# Patient Record
Sex: Female | Born: 1935
Health system: Southern US, Community
[De-identification: ages and names within clinical notes are randomized; demographics above are authoritative.]

## PROBLEM LIST (undated history)

## (undated) DIAGNOSIS — J45909 Unspecified asthma, uncomplicated: Secondary | ICD-10-CM

## (undated) DIAGNOSIS — I4891 Unspecified atrial fibrillation: Secondary | ICD-10-CM

## (undated) DIAGNOSIS — L2089 Other atopic dermatitis: Secondary | ICD-10-CM

## (undated) DIAGNOSIS — R001 Bradycardia, unspecified: Secondary | ICD-10-CM

## (undated) DIAGNOSIS — E785 Hyperlipidemia, unspecified: Secondary | ICD-10-CM

## (undated) DIAGNOSIS — C801 Malignant (primary) neoplasm, unspecified: Secondary | ICD-10-CM

## (undated) DIAGNOSIS — I2510 Atherosclerotic heart disease of native coronary artery without angina pectoris: Secondary | ICD-10-CM

## (undated) DIAGNOSIS — Z8639 Personal history of other endocrine, nutritional and metabolic disease: Secondary | ICD-10-CM

## (undated) DIAGNOSIS — J301 Allergic rhinitis due to pollen: Secondary | ICD-10-CM

## (undated) DIAGNOSIS — I5022 Chronic systolic (congestive) heart failure: Secondary | ICD-10-CM

## (undated) DIAGNOSIS — N83209 Unspecified ovarian cyst, unspecified side: Secondary | ICD-10-CM

## (undated) DIAGNOSIS — I469 Cardiac arrest, cause unspecified: Secondary | ICD-10-CM

## (undated) DIAGNOSIS — I255 Ischemic cardiomyopathy: Secondary | ICD-10-CM

## (undated) DIAGNOSIS — Z95810 Presence of automatic (implantable) cardiac defibrillator: Secondary | ICD-10-CM

## (undated) DIAGNOSIS — L309 Dermatitis, unspecified: Secondary | ICD-10-CM

## (undated) DIAGNOSIS — I447 Left bundle-branch block, unspecified: Secondary | ICD-10-CM

## (undated) DIAGNOSIS — Z853 Personal history of malignant neoplasm of breast: Secondary | ICD-10-CM

## (undated) DIAGNOSIS — I10 Essential (primary) hypertension: Secondary | ICD-10-CM

## (undated) HISTORY — DX: Atherosclerotic heart disease of native coronary artery without angina pectoris: I25.10

## (undated) HISTORY — PX: ABDOMINAL HYSTERECTOMY: SHX81

## (undated) HISTORY — PX: POLYPECTOMY: SHX149

## (undated) HISTORY — DX: Cardiac arrest, cause unspecified: I46.9

## (undated) HISTORY — PX: COLONOSCOPY: SHX174

## (undated) HISTORY — DX: Other atopic dermatitis: L20.89

## (undated) HISTORY — DX: Essential (primary) hypertension: I10

## (undated) HISTORY — DX: Ischemic cardiomyopathy: I25.5

## (undated) HISTORY — PX: CORONARY STENT PLACEMENT: SHX1402

## (undated) HISTORY — DX: Unspecified asthma, uncomplicated: J45.909

## (undated) HISTORY — DX: Presence of automatic (implantable) cardiac defibrillator: Z95.810

## (undated) HISTORY — DX: Left bundle-branch block, unspecified: I44.7

## (undated) HISTORY — PX: PTCA: SHX146

## (undated) HISTORY — DX: Personal history of malignant neoplasm of breast: Z85.3

## (undated) HISTORY — DX: Unspecified ovarian cyst, unspecified side: N83.209

## (undated) HISTORY — DX: Unspecified atrial fibrillation: I48.91

## (undated) HISTORY — DX: Personal history of other endocrine, nutritional and metabolic disease: Z86.39

## (undated) HISTORY — DX: Allergic rhinitis due to pollen: J30.1

## (undated) HISTORY — DX: Malignant (primary) neoplasm, unspecified: C80.1

## (undated) HISTORY — DX: Bradycardia, unspecified: R00.1

## (undated) HISTORY — DX: Chronic systolic (congestive) heart failure: I50.22

## (undated) HISTORY — DX: Hyperlipidemia, unspecified: E78.5

## (undated) HISTORY — DX: Dermatitis, unspecified: L30.9

## (undated) HISTORY — PX: OOPHORECTOMY: SHX86

## (undated) HISTORY — PX: CHOLECYSTECTOMY: SHX55

---

## 1998-04-29 ENCOUNTER — Other Ambulatory Visit: Admission: RE | Admit: 1998-04-29 | Discharge: 1998-04-29 | Payer: Self-pay | Admitting: Obstetrics and Gynecology

## 1999-07-26 ENCOUNTER — Encounter: Payer: Self-pay | Admitting: Cardiology

## 1999-07-26 ENCOUNTER — Inpatient Hospital Stay (HOSPITAL_COMMUNITY): Admission: EM | Admit: 1999-07-26 | Discharge: 1999-07-28 | Payer: Self-pay | Admitting: Cardiology

## 2001-10-01 ENCOUNTER — Encounter: Admission: RE | Admit: 2001-10-01 | Discharge: 2001-10-01 | Payer: Self-pay | Admitting: Internal Medicine

## 2001-10-01 ENCOUNTER — Encounter: Payer: Self-pay | Admitting: Internal Medicine

## 2001-10-21 ENCOUNTER — Encounter: Admission: RE | Admit: 2001-10-21 | Discharge: 2001-10-21 | Payer: Self-pay | Admitting: Urology

## 2001-10-21 ENCOUNTER — Encounter: Payer: Self-pay | Admitting: Urology

## 2001-11-18 ENCOUNTER — Encounter: Payer: Self-pay | Admitting: Obstetrics and Gynecology

## 2001-11-18 ENCOUNTER — Encounter: Admission: RE | Admit: 2001-11-18 | Discharge: 2001-11-18 | Payer: Self-pay | Admitting: Obstetrics and Gynecology

## 2001-11-29 ENCOUNTER — Ambulatory Visit: Admission: RE | Admit: 2001-11-29 | Discharge: 2001-11-29 | Payer: Self-pay | Admitting: Obstetrics and Gynecology

## 2001-12-05 ENCOUNTER — Ambulatory Visit (HOSPITAL_COMMUNITY): Admission: RE | Admit: 2001-12-05 | Discharge: 2001-12-06 | Payer: Self-pay | Admitting: Cardiology

## 2001-12-05 ENCOUNTER — Encounter: Payer: Self-pay | Admitting: Cardiology

## 2002-01-17 ENCOUNTER — Inpatient Hospital Stay (HOSPITAL_COMMUNITY): Admission: RE | Admit: 2002-01-17 | Discharge: 2002-01-19 | Payer: Self-pay | Admitting: Obstetrics and Gynecology

## 2002-01-17 ENCOUNTER — Encounter (INDEPENDENT_AMBULATORY_CARE_PROVIDER_SITE_OTHER): Payer: Self-pay | Admitting: *Deleted

## 2003-08-21 ENCOUNTER — Encounter (HOSPITAL_COMMUNITY): Admission: RE | Admit: 2003-08-21 | Discharge: 2003-11-19 | Payer: Self-pay | Admitting: General Surgery

## 2003-08-24 ENCOUNTER — Encounter (INDEPENDENT_AMBULATORY_CARE_PROVIDER_SITE_OTHER): Payer: Self-pay | Admitting: *Deleted

## 2003-08-24 ENCOUNTER — Ambulatory Visit (HOSPITAL_BASED_OUTPATIENT_CLINIC_OR_DEPARTMENT_OTHER): Admission: RE | Admit: 2003-08-24 | Discharge: 2003-08-24 | Payer: Self-pay | Admitting: General Surgery

## 2003-08-24 ENCOUNTER — Ambulatory Visit (HOSPITAL_COMMUNITY): Admission: RE | Admit: 2003-08-24 | Discharge: 2003-08-24 | Payer: Self-pay | Admitting: General Surgery

## 2003-08-24 HISTORY — PX: BREAST LUMPECTOMY: SHX2

## 2003-09-14 ENCOUNTER — Ambulatory Visit: Admission: RE | Admit: 2003-09-14 | Discharge: 2003-10-29 | Payer: Self-pay | Admitting: *Deleted

## 2003-09-18 ENCOUNTER — Ambulatory Visit (HOSPITAL_COMMUNITY): Admission: RE | Admit: 2003-09-18 | Discharge: 2003-09-18 | Payer: Self-pay | Admitting: Oncology

## 2003-09-29 ENCOUNTER — Ambulatory Visit (HOSPITAL_BASED_OUTPATIENT_CLINIC_OR_DEPARTMENT_OTHER): Admission: RE | Admit: 2003-09-29 | Discharge: 2003-09-29 | Payer: Self-pay | Admitting: General Surgery

## 2003-09-29 ENCOUNTER — Ambulatory Visit (HOSPITAL_COMMUNITY): Admission: RE | Admit: 2003-09-29 | Discharge: 2003-09-29 | Payer: Self-pay | Admitting: General Surgery

## 2003-09-29 ENCOUNTER — Encounter (INDEPENDENT_AMBULATORY_CARE_PROVIDER_SITE_OTHER): Payer: Self-pay | Admitting: *Deleted

## 2003-11-09 ENCOUNTER — Ambulatory Visit: Admission: RE | Admit: 2003-11-09 | Discharge: 2004-02-07 | Payer: Self-pay | Admitting: *Deleted

## 2003-12-17 ENCOUNTER — Ambulatory Visit: Payer: Self-pay | Admitting: Oncology

## 2003-12-18 ENCOUNTER — Ambulatory Visit (HOSPITAL_COMMUNITY): Admission: RE | Admit: 2003-12-18 | Discharge: 2003-12-18 | Payer: Self-pay | Admitting: Oncology

## 2004-02-24 ENCOUNTER — Ambulatory Visit: Payer: Self-pay | Admitting: Oncology

## 2004-03-08 ENCOUNTER — Ambulatory Visit: Admission: RE | Admit: 2004-03-08 | Discharge: 2004-03-08 | Payer: Self-pay | Admitting: *Deleted

## 2004-03-18 ENCOUNTER — Ambulatory Visit: Payer: Self-pay | Admitting: Internal Medicine

## 2004-05-16 ENCOUNTER — Ambulatory Visit: Payer: Self-pay | Admitting: Cardiology

## 2004-05-18 ENCOUNTER — Ambulatory Visit: Payer: Self-pay | Admitting: *Deleted

## 2004-05-18 ENCOUNTER — Ambulatory Visit: Payer: Self-pay

## 2004-05-25 ENCOUNTER — Ambulatory Visit: Payer: Self-pay | Admitting: Cardiology

## 2004-06-28 ENCOUNTER — Ambulatory Visit: Payer: Self-pay | Admitting: Oncology

## 2004-07-21 ENCOUNTER — Encounter: Admission: RE | Admit: 2004-07-21 | Discharge: 2004-07-21 | Payer: Self-pay | Admitting: Oncology

## 2004-10-26 ENCOUNTER — Ambulatory Visit: Payer: Self-pay | Admitting: Oncology

## 2004-12-23 ENCOUNTER — Ambulatory Visit: Payer: Self-pay | Admitting: Oncology

## 2005-06-16 ENCOUNTER — Ambulatory Visit: Payer: Self-pay | Admitting: Cardiology

## 2005-07-14 ENCOUNTER — Ambulatory Visit: Payer: Self-pay | Admitting: Oncology

## 2005-07-24 ENCOUNTER — Ambulatory Visit: Payer: Self-pay | Admitting: Oncology

## 2005-07-24 LAB — COMPREHENSIVE METABOLIC PANEL
ALT: 19 U/L (ref 0–40)
AST: 19 U/L (ref 0–37)
Albumin: 4.6 g/dL (ref 3.5–5.2)
Calcium: 9.9 mg/dL (ref 8.4–10.5)
Chloride: 106 mEq/L (ref 96–112)
Potassium: 4.3 mEq/L (ref 3.5–5.3)
Sodium: 144 mEq/L (ref 135–145)
Total Protein: 7.3 g/dL (ref 6.0–8.3)

## 2005-07-24 LAB — CBC WITH DIFFERENTIAL/PLATELET
BASO%: 0.9 % (ref 0.0–2.0)
Basophils Absolute: 0.1 10*3/uL (ref 0.0–0.1)
EOS%: 7.4 % — ABNORMAL HIGH (ref 0.0–7.0)
HGB: 13.2 g/dL (ref 11.6–15.9)
MCH: 27.5 pg (ref 26.0–34.0)
RDW: 14 % (ref 11.3–14.5)
lymph#: 1.8 10*3/uL (ref 0.9–3.3)

## 2005-07-26 ENCOUNTER — Ambulatory Visit: Payer: Self-pay | Admitting: Cardiology

## 2005-10-24 ENCOUNTER — Ambulatory Visit: Payer: Self-pay | Admitting: Gastroenterology

## 2005-10-27 ENCOUNTER — Encounter (INDEPENDENT_AMBULATORY_CARE_PROVIDER_SITE_OTHER): Payer: Self-pay | Admitting: *Deleted

## 2005-10-27 ENCOUNTER — Ambulatory Visit: Payer: Self-pay | Admitting: Gastroenterology

## 2006-01-23 ENCOUNTER — Ambulatory Visit: Payer: Self-pay | Admitting: Oncology

## 2006-01-26 LAB — CBC WITH DIFFERENTIAL/PLATELET
BASO%: 1 % (ref 0.0–2.0)
Basophils Absolute: 0.1 10*3/uL (ref 0.0–0.1)
EOS%: 7.1 % — ABNORMAL HIGH (ref 0.0–7.0)
HCT: 40.8 % (ref 34.8–46.6)
HGB: 13.6 g/dL (ref 11.6–15.9)
LYMPH%: 24.2 % (ref 14.0–48.0)
MCH: 27.2 pg (ref 26.0–34.0)
MCHC: 33.3 g/dL (ref 32.0–36.0)
MONO#: 0.7 10*3/uL (ref 0.1–0.9)
NEUT%: 59.7 % (ref 39.6–76.8)
Platelets: 394 10*3/uL (ref 145–400)

## 2006-01-26 LAB — COMPREHENSIVE METABOLIC PANEL
ALT: 20 U/L (ref 0–35)
BUN: 16 mg/dL (ref 6–23)
CO2: 27 mEq/L (ref 19–32)
Calcium: 10.6 mg/dL — ABNORMAL HIGH (ref 8.4–10.5)
Chloride: 100 mEq/L (ref 96–112)
Creatinine, Ser: 0.86 mg/dL (ref 0.40–1.20)
Total Bilirubin: 0.7 mg/dL (ref 0.3–1.2)

## 2006-01-26 LAB — LACTATE DEHYDROGENASE: LDH: 193 U/L (ref 94–250)

## 2006-01-26 LAB — CANCER ANTIGEN 27.29: CA 27.29: 17 U/mL (ref 0–39)

## 2006-02-16 LAB — VITAMIN D PNL(25-HYDRXY+1,25-DIHY)-BLD: Vit D, 25-Hydroxy: 56 ng/mL (ref 20–57)

## 2006-07-11 ENCOUNTER — Ambulatory Visit: Payer: Self-pay

## 2006-07-11 LAB — CONVERTED CEMR LAB
Albumin: 4.2 g/dL (ref 3.5–5.2)
Alkaline Phosphatase: 59 units/L (ref 39–117)
Cholesterol: 160 mg/dL (ref 0–200)
Creatinine, Ser: 0.8 mg/dL (ref 0.4–1.2)
GFR calc Af Amer: 92 mL/min
GFR calc non Af Amer: 76 mL/min
Potassium: 4 meq/L (ref 3.5–5.1)
Sodium: 145 meq/L (ref 135–145)
Total Bilirubin: 0.8 mg/dL (ref 0.3–1.2)
Total Protein: 7.1 g/dL (ref 6.0–8.3)
Triglycerides: 193 mg/dL — ABNORMAL HIGH (ref 0–149)
VLDL: 39 mg/dL (ref 0–40)

## 2006-07-17 ENCOUNTER — Ambulatory Visit: Payer: Self-pay | Admitting: Oncology

## 2006-07-18 ENCOUNTER — Ambulatory Visit: Payer: Self-pay | Admitting: Cardiology

## 2006-07-23 LAB — CBC WITH DIFFERENTIAL/PLATELET
BASO%: 0.8 % (ref 0.0–2.0)
EOS%: 8.2 % — ABNORMAL HIGH (ref 0.0–7.0)
HCT: 38.6 % (ref 34.8–46.6)
LYMPH%: 27.7 % (ref 14.0–48.0)
MCH: 27.1 pg (ref 26.0–34.0)
MCHC: 34.2 g/dL (ref 32.0–36.0)
NEUT%: 55.6 % (ref 39.6–76.8)
lymph#: 2 10*3/uL (ref 0.9–3.3)

## 2006-07-25 ENCOUNTER — Encounter: Admission: RE | Admit: 2006-07-25 | Discharge: 2006-07-25 | Payer: Self-pay | Admitting: Oncology

## 2006-07-26 LAB — COMPREHENSIVE METABOLIC PANEL
ALT: 18 U/L (ref 0–35)
AST: 20 U/L (ref 0–37)
Albumin: 4.7 g/dL (ref 3.5–5.2)
Alkaline Phosphatase: 65 U/L (ref 39–117)
BUN: 16 mg/dL (ref 6–23)
CO2: 27 mEq/L (ref 19–32)
Calcium: 9.9 mg/dL (ref 8.4–10.5)
Chloride: 102 mEq/L (ref 96–112)
Creatinine, Ser: 0.83 mg/dL (ref 0.40–1.20)
Glucose, Bld: 113 mg/dL — ABNORMAL HIGH (ref 70–99)
Potassium: 4.1 mEq/L (ref 3.5–5.3)
Sodium: 143 mEq/L (ref 135–145)
Total Bilirubin: 0.6 mg/dL (ref 0.3–1.2)
Total Protein: 7.3 g/dL (ref 6.0–8.3)

## 2006-07-26 LAB — LACTATE DEHYDROGENASE: LDH: 178 U/L (ref 94–250)

## 2006-07-26 LAB — CANCER ANTIGEN 27.29: CA 27.29: 18 U/mL (ref 0–39)

## 2006-07-31 ENCOUNTER — Ambulatory Visit: Payer: Self-pay

## 2006-08-01 ENCOUNTER — Ambulatory Visit (HOSPITAL_COMMUNITY): Admission: RE | Admit: 2006-08-01 | Discharge: 2006-08-01 | Payer: Self-pay | Admitting: Oncology

## 2006-12-03 ENCOUNTER — Ambulatory Visit (HOSPITAL_COMMUNITY): Admission: RE | Admit: 2006-12-03 | Discharge: 2006-12-03 | Payer: Self-pay | Admitting: Oncology

## 2007-01-01 ENCOUNTER — Encounter: Payer: Self-pay | Admitting: *Deleted

## 2007-01-01 DIAGNOSIS — J4520 Mild intermittent asthma, uncomplicated: Secondary | ICD-10-CM | POA: Insufficient documentation

## 2007-01-01 DIAGNOSIS — Z862 Personal history of diseases of the blood and blood-forming organs and certain disorders involving the immune mechanism: Secondary | ICD-10-CM | POA: Insufficient documentation

## 2007-01-01 DIAGNOSIS — N83209 Unspecified ovarian cyst, unspecified side: Secondary | ICD-10-CM | POA: Insufficient documentation

## 2007-01-01 DIAGNOSIS — E782 Mixed hyperlipidemia: Secondary | ICD-10-CM | POA: Insufficient documentation

## 2007-01-01 DIAGNOSIS — Z9861 Coronary angioplasty status: Secondary | ICD-10-CM | POA: Insufficient documentation

## 2007-01-01 DIAGNOSIS — Z8639 Personal history of other endocrine, nutritional and metabolic disease: Secondary | ICD-10-CM

## 2007-01-01 DIAGNOSIS — I2510 Atherosclerotic heart disease of native coronary artery without angina pectoris: Secondary | ICD-10-CM | POA: Insufficient documentation

## 2007-01-01 DIAGNOSIS — Z9089 Acquired absence of other organs: Secondary | ICD-10-CM | POA: Insufficient documentation

## 2007-01-01 DIAGNOSIS — J301 Allergic rhinitis due to pollen: Secondary | ICD-10-CM | POA: Insufficient documentation

## 2007-01-01 DIAGNOSIS — I10 Essential (primary) hypertension: Secondary | ICD-10-CM

## 2007-01-01 DIAGNOSIS — Z9079 Acquired absence of other genital organ(s): Secondary | ICD-10-CM | POA: Insufficient documentation

## 2007-01-02 ENCOUNTER — Ambulatory Visit: Payer: Self-pay | Admitting: Internal Medicine

## 2007-01-02 DIAGNOSIS — L2089 Other atopic dermatitis: Secondary | ICD-10-CM | POA: Insufficient documentation

## 2007-01-02 DIAGNOSIS — J069 Acute upper respiratory infection, unspecified: Secondary | ICD-10-CM | POA: Insufficient documentation

## 2007-02-01 ENCOUNTER — Ambulatory Visit: Payer: Self-pay | Admitting: Oncology

## 2007-02-05 LAB — COMPREHENSIVE METABOLIC PANEL
Alkaline Phosphatase: 80 U/L (ref 39–117)
BUN: 18 mg/dL (ref 6–23)
CO2: 29 mEq/L (ref 19–32)
Glucose, Bld: 121 mg/dL — ABNORMAL HIGH (ref 70–99)
Total Bilirubin: 0.5 mg/dL (ref 0.3–1.2)

## 2007-02-05 LAB — LACTATE DEHYDROGENASE: LDH: 162 U/L (ref 94–250)

## 2007-02-05 LAB — CBC WITH DIFFERENTIAL/PLATELET
Basophils Absolute: 0.1 10*3/uL (ref 0.0–0.1)
Eosinophils Absolute: 0.7 10*3/uL — ABNORMAL HIGH (ref 0.0–0.5)
HCT: 41 % (ref 34.8–46.6)
HGB: 13.6 g/dL (ref 11.6–15.9)
LYMPH%: 26.2 % (ref 14.0–48.0)
MCV: 79.1 fL — ABNORMAL LOW (ref 81.0–101.0)
MONO#: 0.6 10*3/uL (ref 0.1–0.9)
MONO%: 7.6 % (ref 0.0–13.0)
NEUT#: 4.2 10*3/uL (ref 1.5–6.5)
NEUT%: 55.9 % (ref 39.6–76.8)
Platelets: 364 10*3/uL (ref 145–400)

## 2007-02-05 LAB — CANCER ANTIGEN 27.29: CA 27.29: 17 U/mL (ref 0–39)

## 2007-04-01 ENCOUNTER — Telehealth: Payer: Self-pay | Admitting: Internal Medicine

## 2007-04-12 ENCOUNTER — Encounter: Payer: Self-pay | Admitting: Internal Medicine

## 2007-07-31 ENCOUNTER — Ambulatory Visit: Payer: Self-pay | Admitting: Oncology

## 2007-08-01 ENCOUNTER — Telehealth: Payer: Self-pay | Admitting: Internal Medicine

## 2007-08-01 ENCOUNTER — Ambulatory Visit: Payer: Self-pay | Admitting: Cardiology

## 2007-08-01 LAB — CONVERTED CEMR LAB
ALT: 19 units/L (ref 0–35)
BUN: 14 mg/dL (ref 6–23)
Bilirubin, Direct: 0.1 mg/dL (ref 0.0–0.3)
GFR calc Af Amer: 80 mL/min
GFR calc non Af Amer: 66 mL/min
HDL: 48.5 mg/dL (ref >39.0)
LDL Cholesterol: 58 mg/dL (ref 0–99)
Sodium: 139 meq/L (ref 135–145)
Total CHOL/HDL Ratio: 2.8
Total Protein: 7.2 g/dL (ref 6.0–8.3)
Triglycerides: 158 mg/dL — ABNORMAL HIGH (ref 0–149)

## 2007-08-02 ENCOUNTER — Telehealth: Payer: Self-pay | Admitting: Internal Medicine

## 2007-08-05 ENCOUNTER — Encounter: Payer: Self-pay | Admitting: Internal Medicine

## 2007-08-05 LAB — COMPREHENSIVE METABOLIC PANEL
ALT: 16 U/L (ref 0–35)
BUN: 16 mg/dL (ref 6–23)
CO2: 29 mEq/L (ref 19–32)
Calcium: 9.7 mg/dL (ref 8.4–10.5)
Chloride: 103 mEq/L (ref 96–112)
Creatinine, Ser: 0.81 mg/dL (ref 0.40–1.20)

## 2007-08-05 LAB — CANCER ANTIGEN 27.29: CA 27.29: 18 U/mL (ref 0–39)

## 2007-08-05 LAB — CBC WITH DIFFERENTIAL/PLATELET
BASO%: 0.7 % (ref 0.0–2.0)
Basophils Absolute: 0.1 10*3/uL (ref 0.0–0.1)
Eosinophils Absolute: 0.6 10*3/uL — ABNORMAL HIGH (ref 0.0–0.5)
HCT: 39.3 % (ref 34.8–46.6)
HGB: 13.3 g/dL (ref 11.6–15.9)
MONO#: 0.6 10*3/uL (ref 0.1–0.9)
NEUT#: 4.2 10*3/uL (ref 1.5–6.5)
NEUT%: 58.7 % (ref 39.6–76.8)
WBC: 7.2 10*3/uL (ref 3.9–10.0)
lymph#: 1.7 10*3/uL (ref 0.9–3.3)

## 2007-08-08 ENCOUNTER — Encounter (INDEPENDENT_AMBULATORY_CARE_PROVIDER_SITE_OTHER): Payer: Self-pay | Admitting: *Deleted

## 2007-10-09 ENCOUNTER — Encounter: Payer: Self-pay | Admitting: Internal Medicine

## 2007-11-18 ENCOUNTER — Ambulatory Visit: Payer: Self-pay | Admitting: Cardiovascular Disease

## 2007-11-18 ENCOUNTER — Inpatient Hospital Stay (HOSPITAL_COMMUNITY): Admission: EM | Admit: 2007-11-18 | Discharge: 2007-11-19 | Payer: Self-pay | Admitting: Emergency Medicine

## 2007-11-18 ENCOUNTER — Encounter: Payer: Self-pay | Admitting: Cardiovascular Disease

## 2007-11-27 ENCOUNTER — Encounter: Payer: Self-pay | Admitting: Cardiology

## 2007-11-27 ENCOUNTER — Encounter: Payer: Self-pay | Admitting: Internal Medicine

## 2007-11-27 ENCOUNTER — Ambulatory Visit: Payer: Self-pay

## 2007-12-10 ENCOUNTER — Ambulatory Visit: Payer: Self-pay | Admitting: Cardiology

## 2008-01-02 ENCOUNTER — Telehealth: Payer: Self-pay | Admitting: Internal Medicine

## 2008-02-03 ENCOUNTER — Ambulatory Visit: Payer: Self-pay | Admitting: Oncology

## 2008-02-05 ENCOUNTER — Encounter: Payer: Self-pay | Admitting: Internal Medicine

## 2008-04-02 ENCOUNTER — Ambulatory Visit: Payer: Self-pay | Admitting: Endocrinology

## 2008-04-02 DIAGNOSIS — R059 Cough, unspecified: Secondary | ICD-10-CM | POA: Insufficient documentation

## 2008-04-02 DIAGNOSIS — R05 Cough: Secondary | ICD-10-CM

## 2008-07-09 ENCOUNTER — Encounter: Payer: Self-pay | Admitting: Cardiology

## 2008-07-09 DIAGNOSIS — I4891 Unspecified atrial fibrillation: Secondary | ICD-10-CM | POA: Insufficient documentation

## 2008-07-13 ENCOUNTER — Encounter: Payer: Self-pay | Admitting: Cardiology

## 2008-07-14 ENCOUNTER — Ambulatory Visit: Payer: Self-pay | Admitting: Cardiology

## 2008-07-17 ENCOUNTER — Ambulatory Visit: Payer: Self-pay | Admitting: Cardiology

## 2008-07-21 ENCOUNTER — Telehealth (INDEPENDENT_AMBULATORY_CARE_PROVIDER_SITE_OTHER): Payer: Self-pay | Admitting: *Deleted

## 2008-07-21 LAB — CONVERTED CEMR LAB
Albumin: 4.3 g/dL (ref 3.5–5.2)
BUN: 16 mg/dL (ref 6–23)
CO2: 31 meq/L (ref 19–32)
Calcium: 9.9 mg/dL (ref 8.4–10.5)
Cholesterol: 122 mg/dL (ref 0–200)
Creatinine, Ser: 0.8 mg/dL (ref 0.4–1.2)
Potassium: 3.8 meq/L (ref 3.5–5.1)
Total Protein: 7.1 g/dL (ref 6.0–8.3)
VLDL: 26.2 mg/dL (ref 0.0–40.0)

## 2008-07-30 ENCOUNTER — Telehealth: Payer: Self-pay | Admitting: Cardiology

## 2008-08-04 ENCOUNTER — Ambulatory Visit: Payer: Self-pay | Admitting: Oncology

## 2008-08-06 ENCOUNTER — Encounter: Payer: Self-pay | Admitting: Internal Medicine

## 2008-08-06 LAB — CBC WITH DIFFERENTIAL/PLATELET
Basophils Absolute: 0 10*3/uL (ref 0.0–0.1)
EOS%: 4.8 % (ref 0.0–7.0)
HCT: 40.8 % (ref 34.8–46.6)
HGB: 13.9 g/dL (ref 11.6–15.9)
MCH: 27.9 pg (ref 25.1–34.0)
MONO#: 0.5 10*3/uL (ref 0.1–0.9)
NEUT#: 4.5 10*3/uL (ref 1.5–6.5)
NEUT%: 60.7 % (ref 38.4–76.8)
RDW: 14.4 % (ref 11.2–14.5)
WBC: 7.4 10*3/uL (ref 3.9–10.3)
lymph#: 2 10*3/uL (ref 0.9–3.3)

## 2008-08-07 LAB — COMPREHENSIVE METABOLIC PANEL
ALT: 20 U/L (ref 0–35)
AST: 19 U/L (ref 0–37)
Albumin: 4.8 g/dL (ref 3.5–5.2)
BUN: 20 mg/dL (ref 6–23)
CO2: 28 mEq/L (ref 19–32)
Calcium: 10.9 mg/dL — ABNORMAL HIGH (ref 8.4–10.5)
Chloride: 102 mEq/L (ref 96–112)
Potassium: 4 mEq/L (ref 3.5–5.3)

## 2008-08-07 LAB — VITAMIN D 25 HYDROXY (VIT D DEFICIENCY, FRACTURES): Vit D, 25-Hydroxy: 43 ng/mL (ref 30–89)

## 2008-08-07 LAB — LACTATE DEHYDROGENASE: LDH: 154 U/L (ref 94–250)

## 2008-08-10 ENCOUNTER — Encounter: Payer: Self-pay | Admitting: Internal Medicine

## 2008-08-10 ENCOUNTER — Encounter: Payer: Self-pay | Admitting: Cardiology

## 2008-09-25 ENCOUNTER — Ambulatory Visit: Payer: Self-pay | Admitting: Internal Medicine

## 2008-09-25 DIAGNOSIS — L659 Nonscarring hair loss, unspecified: Secondary | ICD-10-CM | POA: Insufficient documentation

## 2008-09-26 DIAGNOSIS — Z853 Personal history of malignant neoplasm of breast: Secondary | ICD-10-CM | POA: Insufficient documentation

## 2008-10-16 ENCOUNTER — Telehealth: Payer: Self-pay | Admitting: Cardiology

## 2008-10-27 ENCOUNTER — Telehealth: Payer: Self-pay | Admitting: Cardiology

## 2008-10-28 ENCOUNTER — Encounter: Payer: Self-pay | Admitting: Cardiology

## 2008-11-13 ENCOUNTER — Encounter (INDEPENDENT_AMBULATORY_CARE_PROVIDER_SITE_OTHER): Payer: Self-pay | Admitting: *Deleted

## 2008-12-21 ENCOUNTER — Telehealth: Payer: Self-pay | Admitting: Cardiology

## 2008-12-25 ENCOUNTER — Ambulatory Visit: Payer: Self-pay | Admitting: Oncology

## 2008-12-29 ENCOUNTER — Encounter: Payer: Self-pay | Admitting: Internal Medicine

## 2008-12-29 LAB — CBC WITH DIFFERENTIAL/PLATELET
Basophils Absolute: 0 10*3/uL (ref 0.0–0.1)
EOS%: 5.8 % (ref 0.0–7.0)
Eosinophils Absolute: 0.4 10*3/uL (ref 0.0–0.5)
HCT: 40.3 % (ref 34.8–46.6)
HGB: 13.4 g/dL (ref 11.6–15.9)
MCH: 27.8 pg (ref 25.1–34.0)
MONO#: 0.5 10*3/uL (ref 0.1–0.9)
NEUT#: 4 10*3/uL (ref 1.5–6.5)
NEUT%: 57.2 % (ref 38.4–76.8)
RDW: 14.1 % (ref 11.2–14.5)
lymph#: 2.1 10*3/uL (ref 0.9–3.3)

## 2008-12-29 LAB — COMPREHENSIVE METABOLIC PANEL
Albumin: 4.7 g/dL (ref 3.5–5.2)
BUN: 11 mg/dL (ref 6–23)
CO2: 27 mEq/L (ref 19–32)
Calcium: 9.9 mg/dL (ref 8.4–10.5)
Chloride: 104 mEq/L (ref 96–112)
Glucose, Bld: 121 mg/dL — ABNORMAL HIGH (ref 70–99)
Potassium: 4.7 mEq/L (ref 3.5–5.3)
Sodium: 142 mEq/L (ref 135–145)
Total Protein: 7.4 g/dL (ref 6.0–8.3)

## 2008-12-29 LAB — VITAMIN D 25 HYDROXY (VIT D DEFICIENCY, FRACTURES): Vit D, 25-Hydroxy: 44 ng/mL (ref 30–89)

## 2008-12-29 LAB — CANCER ANTIGEN 27.29: CA 27.29: 21 U/mL (ref 0–39)

## 2009-01-13 ENCOUNTER — Telehealth (INDEPENDENT_AMBULATORY_CARE_PROVIDER_SITE_OTHER): Payer: Self-pay | Admitting: *Deleted

## 2009-02-18 ENCOUNTER — Ambulatory Visit: Payer: Self-pay | Admitting: Cardiology

## 2009-06-29 ENCOUNTER — Ambulatory Visit: Payer: Self-pay | Admitting: Oncology

## 2009-07-01 ENCOUNTER — Encounter: Payer: Self-pay | Admitting: Internal Medicine

## 2009-07-01 LAB — CBC WITH DIFFERENTIAL/PLATELET
BASO%: 0.6 % (ref 0.0–2.0)
HCT: 39.9 % (ref 34.8–46.6)
HGB: 13.5 g/dL (ref 11.6–15.9)
LYMPH%: 28 % (ref 14.0–49.7)
MCHC: 33.9 g/dL (ref 31.5–36.0)
MCV: 82.3 fL (ref 79.5–101.0)
Platelets: 292 10*3/uL (ref 145–400)
RBC: 4.85 10*6/uL (ref 3.70–5.45)
lymph#: 2.2 10*3/uL (ref 0.9–3.3)

## 2009-07-01 LAB — COMPREHENSIVE METABOLIC PANEL
Albumin: 4.7 g/dL (ref 3.5–5.2)
BUN: 14 mg/dL (ref 6–23)
Calcium: 9.9 mg/dL (ref 8.4–10.5)
Sodium: 140 mEq/L (ref 135–145)

## 2009-07-01 LAB — LACTATE DEHYDROGENASE: LDH: 170 U/L (ref 94–250)

## 2009-07-12 ENCOUNTER — Telehealth: Payer: Self-pay | Admitting: Cardiology

## 2009-07-14 ENCOUNTER — Ambulatory Visit: Payer: Self-pay | Admitting: Cardiology

## 2009-07-15 LAB — CONVERTED CEMR LAB
AST: 24 units/L (ref 0–37)
Albumin: 4.4 g/dL (ref 3.5–5.2)
Bilirubin, Direct: 0.1 mg/dL (ref 0.0–0.3)
HDL: 49.2 mg/dL (ref >39.00)
Total Bilirubin: 0.5 mg/dL (ref 0.3–1.2)
Total CHOL/HDL Ratio: 3
Triglycerides: 183 mg/dL — ABNORMAL HIGH (ref 0.0–149.0)
VLDL: 36.6 mg/dL (ref 0.0–40.0)

## 2009-08-11 ENCOUNTER — Encounter: Payer: Self-pay | Admitting: Internal Medicine

## 2009-08-11 ENCOUNTER — Encounter: Payer: Self-pay | Admitting: Cardiology

## 2009-09-09 ENCOUNTER — Encounter: Payer: Self-pay | Admitting: Cardiology

## 2009-09-15 ENCOUNTER — Ambulatory Visit: Payer: Self-pay | Admitting: Cardiology

## 2009-09-15 DIAGNOSIS — I6529 Occlusion and stenosis of unspecified carotid artery: Secondary | ICD-10-CM | POA: Insufficient documentation

## 2009-09-21 ENCOUNTER — Ambulatory Visit: Payer: Self-pay

## 2009-09-21 ENCOUNTER — Encounter: Payer: Self-pay | Admitting: Cardiology

## 2009-10-06 ENCOUNTER — Telehealth: Payer: Self-pay | Admitting: Cardiology

## 2009-10-12 ENCOUNTER — Encounter: Payer: Self-pay | Admitting: Internal Medicine

## 2010-01-24 ENCOUNTER — Encounter: Payer: Self-pay | Admitting: Cardiology

## 2010-01-26 ENCOUNTER — Encounter: Payer: Self-pay | Admitting: Cardiology

## 2010-02-06 ENCOUNTER — Encounter: Payer: Self-pay | Admitting: Oncology

## 2010-02-17 NOTE — Letter (Signed)
Summary: [facility]  [facility] [place]   Imported By: Marylou Mccoy 02/08/2010 15:03:23  _____________________________________________________________________  External Attachment:    Type:   Image     Comment:   External Document

## 2010-02-17 NOTE — Letter (Signed)
Summary: [facility]  [facility]   Imported By: Marylou Mccoy 09/02/2009 10:17:40  _____________________________________________________________________  External Attachment:    Type:   Image     Comment:   External Document

## 2010-02-17 NOTE — Letter (Signed)
[facility]   Imported By: Lester Tinsman 01/28/2009 09:23:55  _____________________________________________________________________  External Attachment:    Type:   Image     Comment:   External Document

## 2010-02-17 NOTE — Miscellaneous (Signed)
Summary: [facility]  [facility]   Imported By: Sherian Rein 01/18/2010 12:16:07  _____________________________________________________________________  External Attachment:    Type:   Image     Comment:   External Document

## 2010-02-17 NOTE — Letter (Signed)
Summary: [facility]  [facility]   Imported By: Marylou Mccoy 10/15/2009 10:50:29  _____________________________________________________________________  External Attachment:    Type:   Image     Comment:   External Document

## 2010-02-17 NOTE — Assessment & Plan Note (Signed)
Summary: 6 mo f/u ./cy  Medications Added VYTORIN 10-40 MG  TABS (EZETIMIBE-SIMVASTATIN) Take one tablet once daily LASIX 40 MG  TABS (FUROSEMIDE) Take one tablet once daily [name] 4 MG  TABS (TRANDOLAPRIL) Take one tablet once daily NITROSTAT 0.4 MG SUBL (NITROGLYCERIN) 1 tablet under tongue at onset of chest pain; you may repeat every 5 minutes for up to 3 doses. DILT-CD 300 MG XR24H-CAP (DILTIAZEM HCL COATED BEADS) one tablet by mouth once daily KLOR-CON M20 20 MEQ CR-TABS (POTASSIUM CHLORIDE CRYS CR) 1 tab once daily        Visit Type:  6 mo f/u Primary Provider:  Jacques Navy MD  CC:  no cardiac complaints today.  History of Present Illness: Alicia Guerrero comes in today for further evaluation and management of her coronary artery disease.  She looks remarkably good today having lost over 25 pounds. I just reviewed her lipid panel showed total cholesterol 138, HDL is increased to 49.2 LDL is 52, VLDL is 36.6, triglycerides are mildly increased at 183. LFTs were normal.  She has no angina or ischemic symptoms. His stress test last year per my and her recollection that I cannot find in the computer. I remember it being normal.  She denies any palpitations or syncope.she's had no orthopnea PND or peripheral edema.  Clinical Reports Reviewed:  Cardiac Cath:  12/05/2001: Cardiac Cath Findings:   CONCLUSION:  1. Coronary artery disease status post previous percutaneous intervention as     described above with 80% narrowing in the proximal left anterior     descending, 40% ostial narrowing of the first large diagonal branch, 50%     ostial narrowing of the marginal branch of the circumflex artery, 40%     narrowing in the mid right coronary artery, less than 10% narrowing at     the stent site and the posterior lateral branch of the right coronary     artery and normal left ventricular function.  2. Successful stenting of the proximal left anterior descending lesion with  improvement in percent diameter narrowing from 80% to less than 10% with     a Cypher stent.   DISPOSITION:  DISPOSITION:  The patient was returned to the postangioplasty  unit for further observation.  She will need Plavix for three months.                                             Charlies Constable, M.D. [facility]   BB/MEDQ  D:  12/05/2001  T:  12/05/2001  Job:  [id number]   cc:   Jesse Sans. Daleen Squibb, M.D. [facility]   [name], M.D. [facility]   Cardiopulmonary Lab   Malachi Pro. Ambrose Mantle, M.D.  [address]  [place]  [place] [address]  Fax: [phone]  07/27/1999: Cardiac Cath Findings:  CONCLUSIONS: 1. Coronary artery disease with 80% narrowing in the proximal LAD, 50-70%    narrowing in the distal LAD, 90% narrowing in a small sub branch of the    diagonal branch of the LAD, 50% in the marginal branch of the circumflex    artery, and 0% stenosis at the stent site in the right coronary artery    with good LV function. 2. Successful cutting balloon angioplasty of the proximal LAD lesion with    improvement in percent area narrowing from 80% to less than 20%.  DISPOSITION:  We elected to use the cutting balloon rather than stent this because of the large diagonal side branch which arose at a fairly sharp angle. I felt the diagonal would be severely compromised with stenting, and we also would probably require a long stent due to some tandem lesions distal to the more focal severe stenosis.  The patient was returned to the post anesthesia unit for further observation. DD:  07/27/99 TD:  07/27/99 Job: [id number]   Current Medications (verified): 1)  Vytorin 10-40 Mg  Tabs (Ezetimibe-Simvastatin) .... Take One Tablet Once Daily 2)  Arimidex 1 Mg  Tabs (Anastrozole) .... Take One Tablet Once Daily 3)  Lasix 40 Mg  Tabs (Furosemide) .... Take One Tablet Once Daily 4)  Multivitamins   Tabs (Multiple Vitamin) .... Take One Tablet Once Daily 5)  [name] 4 Mg  Tabs (Trandolapril) .... Take One Tablet  Once Daily 6)  Caltrate 600+d Plus 600-400 Mg-Unit  Tabs (Calcium Carbonate-Vit D-Min) .... Take 2 Tablet Daily 7)  Aspirin 325 Mg  Tabs (Aspirin) .... Take One Tablet Once Daily 8)  Nitrostat 0.4 Mg Subl (Nitroglycerin) .Marland Kitchen.. 1 Tablet Under Tongue At Onset of Chest Pain; You May Repeat Every 5 Minutes For Up To 3 Doses. 9)  Dilt-Cd 300 Mg Xr24h-Cap (Diltiazem Hcl Coated Beads) .... One Tablet By Mouth Once Daily 10)  Klor-Con M20 20 Meq Cr-Tabs (Potassium Chloride Crys Cr) .Marland Kitchen.. 1 Tab Once Daily  Allergies: 1)  ! Penicillin 2)  ! Demerol  Past History:  Past Medical History: Last updated: 09/25/2008 LEFT BUNDLE BRANCH BLOCK/TRANSIENT (ICD-426.3) ATRIAL FIBRILLATION (ICD-427.31) Hx of MYOCARDIAL INFARCTION (ICD-410.90) CORONARY ARTERY DISEASE (ICD-414.00) HYPERLIPIDEMIA, MIXED (ICD-272.2) HYPERTENSION (ICD-401.9) COUGH (ICD-786.2) ECZEMA, ATOPIC (ICD-691.8) URI (ICD-465.9) HAY FEVER (ICD-477.0) ASTHMA (ICD-493.90) Hx of OVARIAN CYST (ICD-620.2) ADENOCARCINOMA, BREAST, HX OF (ICD-V10.3) Hx of HYPERTHYROIDISM, HX OF (ICD-V12.2) chronic eczema hands.  Past Surgical History: Last updated: 07/09/2008 OOPHORECTOMY, HX OF (ICD-V45.77) HYSTERECTOMY, HX OF (ICD-V45.77) CHOLECYSTECTOMY, HX OF (ICD-V45.79) PERCUTANEOUS TRANSLUMINAL CORONARY ANGIOPLASTY, HX OF (ICD-V45.82) * RIGHT LUMPECTOMY WITH NODE DISSECTION RADIATION THERAPY, HX OF (ICD-V15.9) G2P2  Family History: Last updated: 07/09/2008 Family History of Cancer: mother deceased at age 26 Family History of Hypertension: Mother Family History of Coronary Artery Disease: Father deceased 46 Siblings: 2 sisters..1 w/heart problems .Marland Kitchenother sister has had endometrial cancer. Siblings: 2 brothers alive and 1 brother deceased from blood clot.. the 2 brothers alive both have heart problems  Social History: Last updated: 07/09/2008 married 1961 2 sons - '65, '69 2 grandchildren worked for [employer] in [employer] until retirement SO  with multiple problems Marriage in good condition  Risk Factors: Smoking Status: never (01/01/2007)  Review of Systems       negative other than history of present illness  Vital Signs:  Patient profile:   75 year old female Height:      64 inches Weight:      175.4 pounds BMI:     30.22 Pulse rate:   56 / minute Pulse rhythm:   irregular BP sitting:   124 / 80  (left arm) Cuff size:   large  Vitals Entered By: Danielle Rankin, CMA (September 15, 2009 4:05 PM)  Physical Exam  General:  no acute distress, she has lost a lot of weight Head:  normocephalic and atraumatic Eyes:  last is otherwise normal Neck:  Neck supple, no JVD. No masses, thyromegaly or abnormal cervical nodes. Lungs:  Clear bilaterally to auscultation and percussion. Heart:  PMI nondisplaced regular rate and  rhythm, normal S1-S2, no murmur, soft right carotid bruit Msk:  Back normal, normal gait. Muscle strength and tone normal. Pulses:  pulses normal in all 4 extremities Extremities:  No clubbing or cyanosis. Neurologic:  Alert and oriented x 3. Skin:  Intact without lesions or rashes. Psych:  Normal affect.   Impression & Recommendations:  Problem # 1:  ATRIAL FIBRILLATION (ICD-427.31)  Her updated medication list for this problem includes:    Aspirin 325 Mg Tabs (Aspirin) .Marland Kitchen... Take one tablet once daily  Problem # 2:  CORONARY ARTERY DISEASE (ICD-414.00)  Her updated medication list for this problem includes:    [name] 4 Mg Tabs (Trandolapril) .Marland Kitchen... Take one tablet once daily    Aspirin 325 Mg Tabs (Aspirin) .Marland Kitchen... Take one tablet once daily    Nitrostat 0.4 Mg Subl (Nitroglycerin) .Marland Kitchen... 1 tablet under tongue at onset of chest pain; you may repeat every 5 minutes for up to 3 doses.    Dilt-cd 300 Mg Xr24h-cap (Diltiazem hcl coated beads) ..... One tablet by mouth once daily  Problem # 3:  ENCOUNTER FOR LONG-TERM USE OF OTHER MEDICATIONS (ICD-V58.69)  Problem # 4:  LEFT BUNDLE BRANCH BLOCK/TRANSIENT  (ICD-426.3)  Her updated medication list for this problem includes:    [name] 4 Mg Tabs (Trandolapril) .Marland Kitchen... Take one tablet once daily    Aspirin 325 Mg Tabs (Aspirin) .Marland Kitchen... Take one tablet once daily    Nitrostat 0.4 Mg Subl (Nitroglycerin) .Marland Kitchen... 1 tablet under tongue at onset of chest pain; you may repeat every 5 minutes for up to 3 doses.    Dilt-cd 300 Mg Xr24h-cap (Diltiazem hcl coated beads) ..... One tablet by mouth once daily  Problem # 5:  HYPERLIPIDEMIA, MIXED (ICD-272.2)  Her updated medication list for this problem includes:    Vytorin 10-40 Mg Tabs (Ezetimibe-simvastatin) .Marland Kitchen... Take one tablet once daily  Problem # 6:  HYPERTENSION (ICD-401.9)  Her updated medication list for this problem includes:    Lasix 40 Mg Tabs (Furosemide) .Marland Kitchen... Take one tablet once daily    [name] 4 Mg Tabs (Trandolapril) .Marland Kitchen... Take one tablet once daily    Aspirin 325 Mg Tabs (Aspirin) .Marland Kitchen... Take one tablet once daily    Dilt-cd 300 Mg Xr24h-cap (Diltiazem hcl coated beads) ..... One tablet by mouth once daily  Other Orders: Carotid Duplex (Carotid Duplex)  Patient Instructions: 1)  Your physician recommends that you schedule a follow-up appointment in: 6 months with Dr. Daleen Squibb 2)  Your physician recommends that you continue on your current medications as directed. Please refer to the Current Medication list given to you today. 3)  Your physician has requested that you have a carotid duplex. This test is an ultrasound of the carotid arteries in your neck. It looks at blood flow through these arteries that supply the brain with blood. Allow one hour for this exam. There are no restrictions or special instructions. Prescriptions: KLOR-CON M20 20 MEQ CR-TABS (POTASSIUM CHLORIDE CRYS CR) 1 tab once daily  #30 x 11   Entered by:   Danielle Rankin, CMA   Authorized by:   Gaylord Shih, MD, [facility]   Signed by:   Danielle Rankin, CMA on 09/15/2009   Method used:   Electronically to        [facility]  [address]  (retail)       [address]       [place], [place]  [address]  Ph: [phone]       Fax: [phone]   RxID:   [id number] DILT-CD 300 MG XR24H-CAP (DILTIAZEM HCL COATED BEADS) one tablet by mouth once daily  #30 x 11   Entered by:   Danielle Rankin, CMA   Authorized by:   Gaylord Shih, MD, [facility]   Signed by:   Danielle Rankin, CMA on 09/15/2009   Method used:   Electronically to        [facility]  [address] (retail)       [address]       [place], [place]  [address]       Ph: [phone]       Fax: [phone]   RxID:   [id number] [name] 4 MG  TABS (TRANDOLAPRIL) Take one tablet once daily  #30 x 11   Entered by:   Danielle Rankin, CMA   Authorized by:   Gaylord Shih, MD, [facility]   Signed by:   Danielle Rankin, CMA on 09/15/2009   Method used:   Electronically to        [facility]  [address] (retail)       [address]       [place], [place]  [address]       Ph: [phone]       Fax: [phone]   RxID:   [id number] LASIX 40 MG  TABS (FUROSEMIDE) Take one tablet once daily  #30 x 11   Entered by:   Danielle Rankin, CMA   Authorized by:   Gaylord Shih, MD, [facility]   Signed by:   Danielle Rankin, CMA on 09/15/2009   Method used:   Electronically to        [facility]  [address] (retail)       [address]       [place], [place]  [address]       Ph: [phone]       Fax: [phone]   RxID:   [id number] VYTORIN 10-40 MG  TABS (EZETIMIBE-SIMVASTATIN) Take one tablet once daily  #30 x 11   Entered by:   Danielle Rankin, CMA   Authorized by:   Gaylord Shih, MD, [facility]   Signed by:   Danielle Rankin, CMA on 09/15/2009   Method used:   Electronically to        [facility]  [address] (retail)       [address]       [place], [place]  [address]       Ph: [phone]       Fax: [phone]   RxID:   [id number] NITROSTAT 0.4 MG SUBL (NITROGLYCERIN) 1 tablet under tongue at onset of chest pain; you  may repeat every 5 minutes for up to 3 doses.  #25 x 7   Entered by:   Danielle Rankin, CMA   Authorized by:   Gaylord Shih, MD, [facility]   Signed by:   Danielle Rankin, CMA on 09/15/2009   Method used:   Electronically to        [facility]  [address] (retail)       [address]       [place], [place]  [address]  Ph: [phone]       Fax: [phone]   RxID:   [id number]   Appended Document: [facility]      Allergies: 1)  ! Penicillin 2)  ! Demerol   Problems:  Medical Problems Added: 1)  Dx of Carotid Artery Stenosis, Without Infarction  (ICD-433.10)  Impression & Recommendations:  Problem # 1:  CAROTID ARTERY STENOSIS, WITHOUT INFARCTION (ICD-433.10) Assessment New This is a new finding. Carotid Dopplers have been ordered. Her updated medication list for this problem includes:    Aspirin 325 Mg Tabs (Aspirin) .Marland Kitchen... Take one tablet once daily

## 2010-02-17 NOTE — Progress Notes (Signed)
Summary: Test results   Phone Note Call from Patient Call back at Home Phone [phone]   Caller: Patient Summary of Call: test results Initial call taken by: Judie Grieve,  October 06, 2009 10:36 AM  Follow-up for Phone Call        gave pt results of carotids. Claris Gladden, RN, BSN Follow-up by: Claris Gladden RN,  October 06, 2009 11:04 AM

## 2010-02-17 NOTE — Progress Notes (Signed)
Summary: does she need lab work before appt?   Phone Note Call from Patient   Caller: Patient [phone] or [phone] Reason for Call: Talk to Nurse Summary of Call: pt called to set up follow up for august-wanted to know if she needed any lab work before? pls call [phone] or [phone] Initial call taken by: Glynda Jaeger,  July 12, 2009 9:57 AM  Follow-up for Phone Call        Phone Call Completed LABS SCHEDULED FOR JUNE 29 ,2011 FASTING  BMET LIPID LIVER . Follow-up by: Scherrie Bateman, LPN,  July 12, 2009 3:46 PM

## 2010-02-17 NOTE — Assessment & Plan Note (Signed)
Summary: 414.01  272.2 427.31  6 month rov  Medications Added KLOR-CON M20 20 MEQ CR-TABS (POTASSIUM CHLORIDE CRYS CR) 1 tab once daily        Visit Type:  6 mo f/u Primary Provider:  Norins  CC:  pt states the last 3 days she has had a fever and cough..states otherwise no other complaints today.  History of Present Illness: Alicia Guerrero returns today for evaluation and her coronary artery disease, chronic left bundle branch block, history of paroxysmal atrial fibrillation.  She's doing remarkably well with no symptoms of palpitations, chest pain or angina.    Current Medications (verified): 1)  Vytorin 10-40 Mg  Tabs (Ezetimibe-Simvastatin) .... Take One Tablet Once Daily 2)  Arimidex 1 Mg  Tabs (Anastrozole) .... Take One Tablet Once Daily 3)  Lasix 40 Mg  Tabs (Furosemide) .... Take One Tablet Once Daily 4)  Multivitamins   Tabs (Multiple Vitamin) .... Take One Tablet Once Daily 5)  [name] 4 Mg  Tabs (Trandolapril) .... Take One Tablet Once Daily 6)  Caltrate 600+d Plus 600-400 Mg-Unit  Tabs (Calcium Carbonate-Vit D-Min) .... Take 2 Tablet Daily 7)  Aspirin 325 Mg  Tabs (Aspirin) .... Take One Tablet Once Daily 8)  Nitroquick 0.4 Mg  Subl (Nitroglycerin) .... Use Sl As Directed and As Needed 9)  Dilt-Cd 300 Mg Xr24h-Cap (Diltiazem Hcl Coated Beads) .... One Tablet By Mouth Once Daily 10)  Klor-Con M20 20 Meq Cr-Tabs (Potassium Chloride Crys Cr) .Marland Kitchen.. 1 Tab Once Daily  Allergies: 1)  ! Penicillin 2)  ! Demerol  Past History:  Past Medical History: Last updated: 09/25/2008 LEFT BUNDLE BRANCH BLOCK/TRANSIENT (ICD-426.3) ATRIAL FIBRILLATION (ICD-427.31) Hx of MYOCARDIAL INFARCTION (ICD-410.90) CORONARY ARTERY DISEASE (ICD-414.00) HYPERLIPIDEMIA, MIXED (ICD-272.2) HYPERTENSION (ICD-401.9) COUGH (ICD-786.2) ECZEMA, ATOPIC (ICD-691.8) URI (ICD-465.9) HAY FEVER (ICD-477.0) ASTHMA (ICD-493.90) Hx of OVARIAN CYST (ICD-620.2) ADENOCARCINOMA, BREAST, HX OF (ICD-V10.3) Hx of  HYPERTHYROIDISM, HX OF (ICD-V12.2) chronic eczema hands.  Past Surgical History: Last updated: 07/09/2008 OOPHORECTOMY, HX OF (ICD-V45.77) HYSTERECTOMY, HX OF (ICD-V45.77) CHOLECYSTECTOMY, HX OF (ICD-V45.79) PERCUTANEOUS TRANSLUMINAL CORONARY ANGIOPLASTY, HX OF (ICD-V45.82) * RIGHT LUMPECTOMY WITH NODE DISSECTION RADIATION THERAPY, HX OF (ICD-V15.9) G2P2  Family History: Last updated: 07/09/2008 Family History of Cancer: mother deceased at age 17 Family History of Hypertension: Mother Family History of Coronary Artery Disease: Father deceased 70 Siblings: 2 sisters..1 w/heart problems .Marland Kitchenother sister has had endometrial cancer. Siblings: 2 brothers alive and 1 brother deceased from blood clot.. the 2 brothers alive both have heart problems  Social History: Last updated: 07/09/2008 married 1961 2 sons - '65, '69 2 grandchildren worked for [employer] in [employer] until retirement SO with multiple problems Marriage in good condition  Risk Factors: Smoking Status: never (01/01/2007)  Review of Systems       negative other than history of present illness  Vital Signs:  Patient profile:   75 year old female Height:      64 inches Weight:      183 pounds BMI:     31.53 Pulse rate:   58 / minute Pulse rhythm:   irregular BP sitting:   128 / 70  (left arm) Cuff size:   large  Vitals Entered By: Alicia Guerrero, CMA (February 18, 2009 3:29 PM)  Physical Exam  General:  obese.   Head:  normocephalic and atraumatic Eyes:  PERRLA/EOM intact; conjunctiva and lids normal. Neck:  Neck supple, no JVD. No masses, thyromegaly or abnormal cervical nodes. Lungs:  Clear bilaterally to auscultation  and percussion. Heart:  regular rate and rhythm, normal S1, paradoxic split S2 Msk:  decreased ROM.   Pulses:  pulses normal in all 4 extremities Extremities:  No clubbing or cyanosis. Neurologic:  Alert and oriented x 3. Skin:  Intact without lesions or rashes. Psych:  Normal  affect.   EKG  Procedure date:  02/18/2009  Findings:      sinus bradycardia, left bundle branch block  Impression & Recommendations:  Problem # 1:  ATRIAL FIBRILLATION (ICD-427.31) Assessment Improved  Her updated medication list for this problem includes:    Aspirin 325 Mg Tabs (Aspirin) .Marland Kitchen... Take one tablet once daily  Problem # 2:  CORONARY ARTERY DISEASE (ICD-414.00) Assessment: Unchanged  Her updated medication list for this problem includes:    [name] 4 Mg Tabs (Trandolapril) .Marland Kitchen... Take one tablet once daily    Aspirin 325 Mg Tabs (Aspirin) .Marland Kitchen... Take one tablet once daily    Nitroquick 0.4 Mg Subl (Nitroglycerin) ..... Use sl as directed and as needed    Dilt-cd 300 Mg Xr24h-cap (Diltiazem hcl coated beads) ..... One tablet by mouth once daily  Orders: EKG w/ Interpretation (93000)  Problem # 3:  LEFT BUNDLE BRANCH BLOCK/TRANSIENT (ICD-426.3) Assessment: Unchanged  Her updated medication list for this problem includes:    [name] 4 Mg Tabs (Trandolapril) .Marland Kitchen... Take one tablet once daily    Aspirin 325 Mg Tabs (Aspirin) .Marland Kitchen... Take one tablet once daily    Nitroquick 0.4 Mg Subl (Nitroglycerin) ..... Use sl as directed and as needed    Dilt-cd 300 Mg Xr24h-cap (Diltiazem hcl coated beads) ..... One tablet by mouth once daily  Patient Instructions: 1)  Your physician recommends that you schedule a follow-up appointment in: 6 MONTHS WITH DR Serita Degroote 2)  Your physician recommends that you continue on your current medications as directed. Please refer to the Current Medication list given to you today.

## 2010-02-17 NOTE — Miscellaneous (Signed)
Clinical Lists Changes  Medications: Rx of LASIX 40 MG  TABS (FUROSEMIDE) Take one tablet once daily;  #90 x 3;  Signed;  Entered by: Lisabeth Devoid RN;  Authorized by: Gaylord Shih, MD, [facility];  Method used: Faxed to [facility] [place], , , [place]  , Ph: [phone], Fax: [phone] Rx of [name] 4 MG  TABS (TRANDOLAPRIL) Take one tablet once daily;  #90 x 3;  Signed;  Entered by: Lisabeth Devoid RN;  Authorized by: Gaylord Shih, MD, [facility];  Method used: Faxed to [facility] [place], , , [place]  , Ph: [phone], Fax: [phone] Rx of DILT-CD 300 MG XR24H-CAP (DILTIAZEM HCL COATED BEADS) one tablet by mouth once daily;  #90 x 3;  Signed;  Entered by: Lisabeth Devoid RN;  Authorized by: Gaylord Shih, MD, [facility];  Method used: Faxed to [facility] [place], , ,   , Ph: [phone], Fax: [phone]    Prescriptions: DILT-CD 300 MG XR24H-CAP (DILTIAZEM HCL COATED BEADS) one tablet by mouth once daily  #90 x 3   Entered by:   Lisabeth Devoid RN   Authorized by:   Gaylord Shih, MD, [facility]   Signed by:   Lisabeth Devoid RN on 01/26/2010   Method used:   Faxed to ...       [facility] [place] (mail-order)             , [place]         Ph: [phone]       Fax: [phone]   RxID:   [id number] [name] 4 MG  TABS (TRANDOLAPRIL) Take one tablet once daily  #90 x 3   Entered by:   Lisabeth Devoid RN   Authorized by:   Gaylord Shih, MD, [facility]   Signed by:   Lisabeth Devoid RN on 01/26/2010   Method used:   Faxed to ...       [facility] [place] (mail-order)             , [place]         Ph: [phone]       Fax: [phone]   RxID:   [id number] LASIX 40 MG  TABS (FUROSEMIDE) Take one tablet once daily  #90 x 3   Entered by:   Lisabeth Devoid RN   Authorized by:   Gaylord Shih, MD, [facility]   Signed by:   Lisabeth Devoid RN on 01/26/2010   Method used:   Faxed to ...       [facility] [place] (mail-order)             , [place]         Ph: [phone]       Fax: [phone]   RxID:   [id number]   Appended Document:  Pt aware of medication change from vytorin to Simvastatin.  She will come in for lab work on  03/21/10 fasting lipid, liver. Mylo Red RN    Clinical Lists Changes  Medications: Changed medication from VYTORIN 10-40 MG  TABS (EZETIMIBE-SIMVASTATIN) Take one tablet once daily to SIMVASTATIN 40 MG TABS (SIMVASTATIN) Take one tablet by mouth daily at bedtime - Signed Rx of SIMVASTATIN 40 MG TABS (SIMVASTATIN) Take one tablet by mouth daily at bedtime;  #90 x 3;  Signed;  Entered by: Lisabeth Devoid RN;  Authorized by: Gaylord Shih, MD, [facility];  Method used: Faxed to [facility] [place], , ,   , Ph: [phone], Fax: [phone]    Prescriptions: SIMVASTATIN 40 MG TABS (SIMVASTATIN) Take one tablet  by mouth daily at bedtime  #90 x 3   Entered by:   Lisabeth Devoid RN   Authorized by:   Gaylord Shih, MD, [facility]   Signed by:   Lisabeth Devoid RN on 01/27/2010   Method used:   Faxed to ...       [facility] [place] (mail-order[place]             , [place]         Ph: [phone]       Fax: [phone]   RxID:   [id number]

## 2010-02-17 NOTE — Letter (Signed)
[facility]   Imported By: Sherian Rein 07/27/2009 08:38:51  _____________________________________________________________________  External Attachment:    Type:   Image     Comment:   External Document

## 2010-03-21 ENCOUNTER — Encounter: Payer: Self-pay | Admitting: Cardiology

## 2010-03-21 ENCOUNTER — Other Ambulatory Visit: Payer: Self-pay | Admitting: Cardiology

## 2010-03-21 ENCOUNTER — Other Ambulatory Visit (INDEPENDENT_AMBULATORY_CARE_PROVIDER_SITE_OTHER): Payer: Medicare Other

## 2010-03-21 DIAGNOSIS — E785 Hyperlipidemia, unspecified: Secondary | ICD-10-CM

## 2010-03-21 DIAGNOSIS — E782 Mixed hyperlipidemia: Secondary | ICD-10-CM

## 2010-03-21 LAB — BASIC METABOLIC PANEL
CO2: 31 mEq/L (ref 19–32)
Calcium: 10.3 mg/dL (ref 8.4–10.5)
Potassium: 4.2 mEq/L (ref 3.5–5.1)
Sodium: 139 mEq/L (ref 135–145)

## 2010-03-21 LAB — HEPATIC FUNCTION PANEL
AST: 22 U/L (ref 0–37)
Alkaline Phosphatase: 72 U/L (ref 39–117)
Bilirubin, Direct: 0.1 mg/dL (ref 0.0–0.3)
Total Bilirubin: 0.5 mg/dL (ref 0.3–1.2)

## 2010-03-21 LAB — LIPID PANEL: Triglycerides: 223 mg/dL — ABNORMAL HIGH (ref 0.0–149.0)

## 2010-03-23 ENCOUNTER — Telehealth: Payer: Self-pay | Admitting: Cardiology

## 2010-03-24 ENCOUNTER — Encounter: Payer: Self-pay | Admitting: Cardiology

## 2010-03-29 NOTE — Letter (Signed)
Summary: Lipid Letter  Architectural technologist, Main Office  [address]   [place], [place] [address]   Phone: [phone]  Fax: [phone]    03/24/2010  Asherah Lavoy [address] [place], [place]  [address]  Dear Ms. Whitaker:  We have carefully reviewed your last lipid profile from 07/14/2009 and the results are noted below with a summary of recommendations for lipid management.    Cholesterol:       160     Goal: <   HDL "good" Cholesterol:   57.84     Goal: >   LDL "bad" Cholesterol:   52     Goal: <   Triglycerides:       223.0     Goal: <        TLC Diet (Therapeutic Lifestyle Change): Saturated Fats & Transfatty acids should be kept < 7% of total calories ***Reduce Saturated Fats Polyunstaurated Fat can be up to 10% of total calories Monounsaturated Fat Fat can be up to 20% of total calories Total Fat should be no greater than 25-35% of total calories Carbohydrates should be 50-60% of total calories Protein should be approximately 15% of total calories Fiber should be at least 20-30 grams a day ***Increased fiber may help lower LDL Total Cholesterol should be < 200mg /day Consider adding plant stanol/sterols to diet (example: Benacol spread) ***A higher intake of unsaturated fat may reduce Triglycerides and Increase HDL    Adjunctive Measures (may lower LIPIDS and reduce risk of Heart Attack) include: Aerobic Exercise (20-30 minutes 3-4 times a week) Limit Alcohol Consumption Weight Reduction Aspirin 75-81 mg a day by mouth (if not allergic or contraindicated) Dietary Fiber 20-30 grams a day by mouth     Current Medications: 1)    Lipitor 40 Mg Tabs (Atorvastatin calcium) .Marland Kitchen.. 1 tab at bedtime 2)    Arimidex 1 Mg  Tabs (Anastrozole) .... Take one tablet once daily 3)    Lasix 40 Mg  Tabs (Furosemide) .... Take one tablet once daily 4)    Multivitamins   Tabs (Multiple vitamin) .... Take one tablet once daily 5)    [name] 4 Mg  Tabs (Trandolapril) .... Take one  tablet once daily 6)    Caltrate 600+d Plus 600-400 Mg-unit  Tabs (Calcium carbonate-vit d-min) .... Take 2 tablet daily 7)    Aspirin 325 Mg  Tabs (Aspirin) .... Take one tablet once daily 8)    Nitrostat 0.4 Mg Subl (Nitroglycerin) .Marland Kitchen.. 1 tablet under tongue at onset of chest pain; you may repeat every 5 minutes for up to 3 doses. 9)    Dilt-cd 300 Mg Xr24h-cap (Diltiazem hcl coated beads) .... One tablet by mouth once daily 10)    Klor-con M20 20 Meq Cr-tabs (Potassium chloride crys cr) .Marland Kitchen.. 1 tab once daily  If you have any questions, please call. We appreciate being able to work with you.   Sincerely,    [employer], Main Office Jacques Navy MD

## 2010-03-29 NOTE — Progress Notes (Signed)
Summary: lab results   Phone Note Call from Patient Call back at Home Phone [phone]   Caller: Patient Reason for Call: Talk to Nurse, Lab or Test Results Summary of Call: pt calling blood work results  Initial call taken by: Roe Coombs,  March 23, 2010 8:43 AM  Follow-up for Phone Call        Blood work routed to wrong MD. I routed to Dr. Daleen Squibb. Whitney Maeola Sarah RN  March 23, 2010 8:56 AM  Follow-up by: Whitney Maeola Sarah RN,  March 23, 2010 8:56 AM  Additional Follow-up for Phone Call Additional follow up Details #1::        Triglycerides up, decrease carbs, no change in Vytorin Additional Follow-up by: Gaylord Shih, MD, [facility],  March 23, 2010 2:25 PM     Appended Document: lab results Pt aware of lab results and recommendations. Mylo Red RN

## 2010-05-08 ENCOUNTER — Emergency Department (HOSPITAL_COMMUNITY): Payer: Medicare Other

## 2010-05-08 ENCOUNTER — Inpatient Hospital Stay (HOSPITAL_COMMUNITY)
Admission: EM | Admit: 2010-05-08 | Discharge: 2010-05-10 | DRG: 247 | Disposition: A | Discharge status: Home / Self Care | Payer: Medicare Other | Attending: Cardiology | Admitting: Cardiology

## 2010-05-08 DIAGNOSIS — I447 Left bundle-branch block, unspecified: Secondary | ICD-10-CM | POA: Diagnosis present

## 2010-05-08 DIAGNOSIS — I214 Non-ST elevation (NSTEMI) myocardial infarction: Principal | ICD-10-CM | POA: Diagnosis present

## 2010-05-08 DIAGNOSIS — I6529 Occlusion and stenosis of unspecified carotid artery: Secondary | ICD-10-CM | POA: Diagnosis present

## 2010-05-08 DIAGNOSIS — I10 Essential (primary) hypertension: Secondary | ICD-10-CM | POA: Diagnosis present

## 2010-05-08 DIAGNOSIS — I4891 Unspecified atrial fibrillation: Secondary | ICD-10-CM | POA: Diagnosis present

## 2010-05-08 DIAGNOSIS — I252 Old myocardial infarction: Secondary | ICD-10-CM

## 2010-05-08 DIAGNOSIS — R079 Chest pain, unspecified: Secondary | ICD-10-CM

## 2010-05-08 DIAGNOSIS — Z853 Personal history of malignant neoplasm of breast: Secondary | ICD-10-CM

## 2010-05-08 DIAGNOSIS — Z9861 Coronary angioplasty status: Secondary | ICD-10-CM

## 2010-05-08 DIAGNOSIS — Z880 Allergy status to penicillin: Secondary | ICD-10-CM

## 2010-05-08 DIAGNOSIS — Z7982 Long term (current) use of aspirin: Secondary | ICD-10-CM

## 2010-05-08 DIAGNOSIS — I2510 Atherosclerotic heart disease of native coronary artery without angina pectoris: Secondary | ICD-10-CM | POA: Diagnosis present

## 2010-05-08 DIAGNOSIS — E785 Hyperlipidemia, unspecified: Secondary | ICD-10-CM | POA: Diagnosis present

## 2010-05-08 DIAGNOSIS — I658 Occlusion and stenosis of other precerebral arteries: Secondary | ICD-10-CM | POA: Diagnosis present

## 2010-05-08 DIAGNOSIS — Z79899 Other long term (current) drug therapy: Secondary | ICD-10-CM

## 2010-05-08 DIAGNOSIS — Z888 Allergy status to other drugs, medicaments and biological substances status: Secondary | ICD-10-CM

## 2010-05-08 DIAGNOSIS — Z7902 Long term (current) use of antithrombotics/antiplatelets: Secondary | ICD-10-CM

## 2010-05-08 LAB — PROTIME-INR: Prothrombin Time: 12.7 seconds (ref 11.6–15.2)

## 2010-05-08 LAB — HEPARIN LEVEL (UNFRACTIONATED): Heparin Unfractionated: 0.15 IU/mL — ABNORMAL LOW (ref 0.30–0.70)

## 2010-05-08 LAB — BASIC METABOLIC PANEL
BUN: 12 mg/dL (ref 6–23)
CO2: 30 mEq/L (ref 19–32)
Chloride: 102 mEq/L (ref 96–112)
GFR calc Af Amer: >60 mL/min (ref >60)
GFR calc non Af Amer: >60 mL/min (ref >60)
Potassium: 3.5 mEq/L (ref 3.5–5.1)
Sodium: 140 mEq/L (ref 135–145)

## 2010-05-08 LAB — POCT CARDIAC MARKERS: Myoglobin, poc: 112 ng/mL (ref 12–200)

## 2010-05-08 LAB — CK TOTAL AND CKMB (NOT AT ARMC)
CK, MB: 2.7 ng/mL (ref 0.3–4.0)
Relative Index: 2.1 (ref 0.0–2.5)

## 2010-05-08 LAB — CARDIAC PANEL(CRET KIN+CKTOT+MB+TROPI): Troponin I: 5.08 ng/mL (ref 0.00–0.06)

## 2010-05-08 LAB — CBC
HCT: 39.9 % (ref 36.0–46.0)
Hemoglobin: 13 g/dL (ref 12.0–15.0)
MCHC: 32.6 g/dL (ref 30.0–36.0)
MCV: 80.1 fL (ref 78.0–100.0)
RDW: 14.3 % (ref 11.5–15.5)

## 2010-05-09 DIAGNOSIS — I2510 Atherosclerotic heart disease of native coronary artery without angina pectoris: Secondary | ICD-10-CM

## 2010-05-09 LAB — CBC
HCT: 40.3 % (ref 36.0–46.0)
MCH: 26.1 pg (ref 26.0–34.0)
MCHC: 32.3 g/dL (ref 30.0–36.0)
MCV: 80.9 fL (ref 78.0–100.0)
RDW: 14.5 % (ref 11.5–15.5)

## 2010-05-09 LAB — POCT ACTIVATED CLOTTING TIME: Activated Clotting Time: 393 seconds

## 2010-05-10 DIAGNOSIS — I214 Non-ST elevation (NSTEMI) myocardial infarction: Secondary | ICD-10-CM

## 2010-05-10 LAB — CBC
HCT: 40.5 % (ref 36.0–46.0)
Hemoglobin: 13.3 g/dL (ref 12.0–15.0)
WBC: 7.7 10*3/uL (ref 4.0–10.5)

## 2010-05-10 LAB — BASIC METABOLIC PANEL
CO2: 23 mEq/L (ref 19–32)
GFR calc non Af Amer: >60 mL/min (ref >60)
Glucose, Bld: 131 mg/dL — ABNORMAL HIGH (ref 70–99)
Potassium: 3.7 mEq/L (ref 3.5–5.1)
Sodium: 140 mEq/L (ref 135–145)

## 2010-05-12 ENCOUNTER — Telehealth: Payer: Self-pay | Admitting: Cardiology

## 2010-05-12 NOTE — Telephone Encounter (Signed)
Refill carvedilol 6.25mg  two a day [facility]

## 2010-05-13 ENCOUNTER — Other Ambulatory Visit: Payer: Self-pay | Admitting: *Deleted

## 2010-05-13 MED ORDER — CARVEDILOL 6.25 MG PO TABS: 6.2500 mg | ORAL_TABLET | Freq: Two times a day (BID) | ORAL | Status: DC

## 2010-05-17 NOTE — Discharge Summary (Addendum)
NAME:  Alicia Guerrero, Alicia Guerrero                  ACCOUNT NO.:  [id number]  MEDICAL RECORD NO.:  [id number]           PATIENT TYPE:  I  LOCATION:  6533                         FACILITY:  [facility]  PHYSICIAN:  Veverly Fells. Excell Seltzer, MD  DATE OF BIRTH:  08/31/1935  DATE OF ADMISSION:  05/08/2010 DATE OF DISCHARGE:  05/10/2010                              DISCHARGE SUMMARY   DISCHARGE DIAGNOSES: 1. Non-ST-elevation myocardial infarction this admission with troponin     of 5.10.     a.     Status post catheterization with successful complex      percutaneous coronary intervention to the distal right coronary      artery with drug-eluting stent on May 09, 2010.     b.     Patent left anterior descending stent by catheterization      this admission with moderately severe distal vessel disease, small      caliber, patent circumflex. 2. Moderate segmental left ventricular dysfunction with ejection     fraction of 40%.     a.     Diltiazem changed to Coreg this admission. 3. Previous history of coronary artery disease.     a.     Status post percutaneous transluminal coronary      angioplasty/stent to the right coronary artery in 1996.     b.     Status post percutaneous transluminal coronary angioplasty      and cutting balloon to the left anterior descending in 2001.     c.     Status post percutaneous transluminal coronary angioplasty      and Cypher drug-eluting stent placed to the left anterior      descending in 2003. 4. History of paroxysmal atrial fibrillation, on aspirin. 5. History of sinus bradycardia as well as junctional rhythm per     record. 6. Hyperlipidemia. 7. Hypertension. 8. Nonobstructive carotid artery disease, last assessed in September     2011. 9. Breast cancer status post lumpectomy. 10.Chronic left bundle-branch block.  HOSPITAL COURSE:  Alicia Guerrero is a 75 year old female with a history of known coronary artery disease who presented to [facility] with complaints of  chest tightness relieved by nitroglycerin,.  She also complained of feeling tired doing any of her yard work lately.  EKG demonstrated no significant change.  She does have a chronic left bundle- branch block.  Initial cardiac enzymes demonstrated troponin was 0.08 which eventually rolled into a peak troponin of 5.10.  She was referred for cardiac catheterization with Dr. Excell Seltzer, and ultimately the culprit lesion deemed to be in the RCA status post PCI using a drug-eluting stent platform to this vessel.  She also had patent LAD stent with moderately severe distal vessel disease, small caliber.  Her EF was estimated at 40%.  She was initially started on Brilinta, but upon discussion with the patient with Dr. Excell Seltzer, he elected to change her to Plavix as she gets free generic and Plavix and soon to go generic.  She was loaded this morning with 300 mg and started on 75 mg daily. Initially, Cozaar was going to  be added to her medicine regimen, but the patient was already on an ACE inhibitor at home which was held this hospitalization.  After discussion with Dr. Excell Seltzer, we elected to change her back to her trandolapril.  Her diltiazem will also be changed to Coreg in light of her LV dysfunction.  Dr. Excell Seltzer has seen and examined her today and feels she is stable for discharge.  DISCHARGE LABORATORY DATA:  WBC 7.7, hemoglobin 13.2, hematocrit 40.5, platelet count 307.  Sodium 140, potassium 3.7, chloride 109, CO2 23, glucose 131, BUN 8, creatinine 0.65.  STUDIES: 1. Chest x-ray on May 08, 2010, showed cardiomegaly without evidence     of acute cardiopulmonary disease. 2. Cardiac catheterization on May 09, 2010, please see full report     for details.  DISCHARGE MEDICATIONS: 1. Coreg 6.25 mg b.i.d. 2. Plavix 75 mg daily. 3. Nitroglycerin sublingual 0.4 mg every 5 minutes as needed up to 3     doses. 4. Aspirin 81 mg daily. 5. Anastrozole 1 mg daily. 6. Calcium citrate 1200 mg  daily. 7. Cetirizine 10 mg daily for allergies. 8. Furosemide 40 mg daily. 9. Lipitor 40 mg daily. 10.Multivitamin 1 tablet daily. 11.Trandolapril 4 mg daily.  Please note, the patient's diltiazem was discontinued secondary to her LV dysfunction in lieu of adding Coreg.  DISPOSITION:  Alicia Guerrero will be discharged in stable condition to home. She is instructed not to lift anything over 5 pounds for 1 week, participate in sexual activity for 1 week, or drive for 2 days.  She should follow a low-salt, heart-healthy diet, and to call or return if she notices any pain, swelling, bleeding, or pus at her cath site.  She will follow up with Dr. Daleen Squibb on May 26, 2010, at 3:15 p.m.Marland Kitchen  She is also going to get a BMET this visit so she was instructed to arrive at 2:45 p.m.  DURATION OF DISCHARGE ENCOUNTER:  Greater than 30 minutes including physician and PA time.     Ronie Spies, P.A.C.   ______________________________ Veverly Fells. Excell Seltzer, MD    DD/MEDQ  D:  05/10/2010  T:  05/10/2010  Job:  [id number]  cc:   Thomas C. Daleen Squibb, MD, [facility]  Electronically Signed by Ronie Spies  on 05/17/2010 04:35:57 PM Electronically Signed by Tonny Bollman MD on 05/19/2010 10:37:25 AM

## 2010-05-19 NOTE — H&P (Signed)
  NAME:  Alicia Guerrero, Alicia Guerrero                  ACCOUNT NO.:  [id number]  MEDICAL RECORD NO.:  [id number]           PATIENT TYPE:  I  LOCATION:  2020                         FACILITY:  [facility]  PHYSICIAN:  Jesse Sans. Sirenity Shew, MD, FACCDATE OF BIRTH:  [birth year]  DATE OF ADMISSION:  05/08/2010 DATE OF DISCHARGE:                             HISTORY & PHYSICAL   ADDENDUM  SOCIAL HISTORY:  The patient is married.  She is retired from [employer]. She lives in [place].  She does not smoke or drink alcohol.  FAMILY HISTORY:  Pertinent for no premature coronary disease in her family.  She does have a strong family history of ovarian cancer.  REVIEW OF SYSTEMS:  All systems were negative other than HPI.  PHYSICAL EXAMINATION:  VITAL SIGNS:  Her blood pressure is currently 140/80, her pulse is 58 and regular.  She is in sinus rhythm or sinus bradycardia with a left bundle, respirations 18, sats are 98%.  She is afebrile. HEENT:  Normocephalic, atraumatic.  PERRLA.  Extraocular movements intact.  Sclerae are clear.  She wears glasses. NECK:  Supple.  No obvious bruits.  Thyroid is not enlarged.  Trachea is midline. LUNGS:  Clear to auscultation and percussion. HEART:  Reveals a poorly appreciated PMI.  She has a normal S1, paradoxically split S2.  No gallop or rub. ABDOMINAL:  Soft, good bowel sounds.  No midline tenderness.  No obvious organomegaly. EXTREMITIES:  There is no cyanosis, clubbing, or edema.  Pulses are brisk peripherally.  No sign of DVT. NEUROLOGIC:  Grossly intact. SKIN:  Warm and dry.  Her initial cardiac markers are negative.  Her potassium is 3.5, creatinine 0.72.  CBC shows a hemoglobin of 13, platelets of 307,000. White count is 7.3.  Other blood work is pending.  Chest x-ray has not been done.  ASSESSMENT: 1. Unstable angina with progressive shortness of breath, extreme     fatigue, and now chest discomfort.  She has a known left bundle-     branch block and known coronary  disease.  She has had previous     intervention as stated in the HPI.  I suspect she will not rule in     for a non-ST elevation myocardial infarction, but will need to be     studied.  I have recommended cardiac catheterization.  She agrees     to proceed. 2. Chronic left bundle-branch block. 3. History of paroxysmal atrial fibrillation, on aspirin. 4. Hyperlipidemia. 5. Hypertension. 6. Cerebrovascular disease with nonobstructive carotids on September 21, 2009.  PLAN: 1. Admit. 2. Serial cardiac markers. 3. Even if they are negative, we will need to proceed with cath.     Indications, risks, potential benefits have been discussed.  I have     also notified Dr. Illene Regulus of her admission.     Nikos Anglemyer C. Daleen Squibb, MD, [facility]     TCW/MEDQ  D:  05/08/2010  T:  05/08/2010  Job:  [id number]  Electronically Signed by Valera Castle MD [facility] on 05/19/2010 08:19:22 AM

## 2010-05-19 NOTE — H&P (Signed)
  NAME:  AREYA, LEMMERMAN NO.:  [id number]  MEDICAL RECORD NO.:  [id number]           PATIENT TYPE:  E  LOCATION:  [facility]                         FACILITY:  [facility]  PHYSICIAN:  Vantasia Pinkney C. Katherine Tout, MD, FACCDATE OF BIRTH:  October 17, 1935  DATE OF ADMISSION:  05/08/2010 DATE OF DISCHARGE:                             HISTORY & PHYSICAL   CHIEF COMPLAINT:  Chest tightness relieved by nitroglycerin.  HISTORY OF PRESENT ILLNESS:  Ms. Alicia Guerrero is a delightful 75 year old white female, well-known to me, who has a known history of coronary artery disease.  Over the last couple of weeks, she has been more fatigued and just not feeling as well.  She gets very tired doing any of her yard work which is unusual.  Today, while fixing her coffee, she developed a tightness midsternal and felt like she needed to burp.  She does not have problems with indigestion.  She took a full strength aspirin and it eased a little bit with rest.  She then got up and walk around some more and it recurred. She decided to come to the emergency room with her husband bring her. She took a nitroglycerin en route which relieved it by the time she arrived.  She has a chronic left bundle-branch block.  Her current EKG obviously shows no significant change.  She is now pain-free.  PAST MEDICAL HISTORY:  Coronary artery disease.  She has a history of normal left ventricular function with an EF of 60%.  She has had a previous cutting balloon of the LAD in 2001.  This involved the diagonal branch.  She subsequently had a Cypher stent placed to the LAD in 2003 I think in the same location.  She had nonobstructive disease, otherwise.  She also has a history of paroxysmal atrial fibrillation and is on anticoagulation.  She has hyperlipidemia and hypertension as well as nonobstructive carotid disease which was last assessed on September 21, 2009.  CURRENT MEDICATIONS: 1. Vytorin 10/40 1 tablet at bedtime. 2.  Arimidex 1 mg daily. 3. Lasix 40 mg daily. 4. Multivitamin one daily. 5. [name] 4 mg per day. 6. Caltrate 600 plus D 2 tablets a day. 7. Aspirin 325 mg a day. 8. Nitrostat p.r.n. 9. Diltiazem CD 300 mg 1 tablet per day. 10.Potassium 20 mEq a day.  ALLERGIES:  She is allergic to PENICILLIN and DEMEROL.  In addition to the above, she has a history of eczema, history of URIs, history of high fever, questionable history of asthma.     Antonious Omahoney C. Daleen Squibb, MD, [facility]     TCW/MEDQ  D:  05/08/2010  T:  05/08/2010  Job:  [id number]  Electronically Signed by Valera Castle MD [facility] on 05/19/2010 08:19:17 AM

## 2010-05-25 ENCOUNTER — Encounter: Payer: Self-pay | Admitting: *Deleted

## 2010-05-25 ENCOUNTER — Encounter: Payer: Self-pay | Admitting: Cardiology

## 2010-05-25 DIAGNOSIS — I447 Left bundle-branch block, unspecified: Secondary | ICD-10-CM | POA: Insufficient documentation

## 2010-05-26 ENCOUNTER — Encounter: Payer: Self-pay | Admitting: Cardiology

## 2010-05-26 ENCOUNTER — Other Ambulatory Visit (INDEPENDENT_AMBULATORY_CARE_PROVIDER_SITE_OTHER): Payer: Medicare Other | Admitting: *Deleted

## 2010-05-26 ENCOUNTER — Ambulatory Visit (INDEPENDENT_AMBULATORY_CARE_PROVIDER_SITE_OTHER): Payer: Medicare Other | Admitting: Cardiology

## 2010-05-26 VITALS — BP 176/92 | HR 74 | Resp 18 | Ht 64.0 in | Wt 170.8 lb

## 2010-05-26 DIAGNOSIS — I2510 Atherosclerotic heart disease of native coronary artery without angina pectoris: Secondary | ICD-10-CM

## 2010-05-26 DIAGNOSIS — I214 Non-ST elevation (NSTEMI) myocardial infarction: Secondary | ICD-10-CM | POA: Insufficient documentation

## 2010-05-26 DIAGNOSIS — R0989 Other specified symptoms and signs involving the circulatory and respiratory systems: Secondary | ICD-10-CM

## 2010-05-26 DIAGNOSIS — I10 Essential (primary) hypertension: Secondary | ICD-10-CM

## 2010-05-26 MED ORDER — CARVEDILOL 12.5 MG PO TABS: 12.5000 mg | ORAL_TABLET | Freq: Two times a day (BID) | ORAL | Status: DC

## 2010-05-26 MED ORDER — AMLODIPINE BESYLATE 5 MG PO TABS: 5.0000 mg | ORAL_TABLET | Freq: Every day | ORAL | Status: DC

## 2010-05-26 NOTE — Patient Instructions (Signed)
Your physician has recommended you make the following change in your medication:  Increase coreg (carvedilol) 12.5 mg twice a day.  Start  Amlodipine Your physician recommends that you schedule a follow-up appointment in: 4-6 weeks with Dr. Daleen Squibb

## 2010-05-26 NOTE — Progress Notes (Signed)
   Patient ID: Alicia Guerrero, female    DOB: [birth year], 75 y.o.   MRN: [id number]  HPI [name] returns post hospital discharge for a NSTEMI. She had a DES to the distal RCA. Diffuse moderate to severe distal disease in the LAD. EF was 40%. During her stay her diltiazem was changed to carvedilol.  Her only complaint is fatique. No angina or other ischemic symptoms and no symptoms of CHF.    Review of Systems  All other systems reviewed and are negative.      Physical Exam  Nursing note and vitals reviewed. Constitutional: She is oriented to person, place, and time. She appears well-developed and well-nourished. No distress.       obese  HENT:  Head: Normocephalic and atraumatic.  Eyes: EOM are normal. Pupils are equal, round, and reactive to light.  Neck: Neck supple. No JVD present. No tracheal deviation present. No thyromegaly present.  Cardiovascular: Normal rate, regular rhythm, normal heart sounds and intact distal pulses.  Exam reveals no gallop.   No murmur heard. Pulmonary/Chest: Effort normal and breath sounds normal.  Abdominal: Soft. Bowel sounds are normal.  Musculoskeletal: Normal range of motion. She exhibits no edema.  Neurological: She is alert and oriented to person, place, and time.  Skin: Skin is warm and dry.  Psychiatric: She has a normal mood and affect.

## 2010-05-26 NOTE — Assessment & Plan Note (Signed)
Will increase carvedilol to 12.5mg  bid. Will also add amlodopine 5mg /day.

## 2010-05-27 LAB — BASIC METABOLIC PANEL
CO2: 32 mEq/L (ref 19–32)
Chloride: 103 mEq/L (ref 96–112)
Creatinine, Ser: 0.9 mg/dL (ref 0.4–1.2)

## 2010-05-31 NOTE — Assessment & Plan Note (Signed)
[facility]                            CARDIOLOGY OFFICE NOTE   VANCE, BELCOURT                         MRN:          [id number]  DATE:12/10/2007                            DOB:          23-Jan-1935    Ms. Mogel comes in today for followup.  She has had no further symptoms  of chest burning or feeling like a bowl of jelly quivering in her  chest.  When she had her atrial fib last time, she felt it all of a  sudden.   She has done well on just diltiazem CD 240 mg a day.  She remains on  just aspirin and not Coumadin.   The rest of her medicines are unchanged.   We performed a stress Myoview, showed an EF of 55% with no ischemia.  She did have some significant EKG changes including a transient left  bundle which was noted when she had atrial fib with a rapid ventricular  rate.   Her echocardiogram was also normal with mild calcified aortic valve, but  no significant stenosis.  She had some mild mitral regurgitation.  Her  left atrial size is only 35 mmHg mm in size.   I had a long talk with Carney Bern today.  At the present time, we both would  rather not go with Coumadin unless she begins to have recurrent  breakthroughs.  This was her first episode.   I have made no change in medical program.  I will see her back in 6  months.     Thomas C. Daleen Squibb, MD, [facility]  Electronically Signed    TCW/MedQ  DD: 12/10/2007  DT: 12/11/2007  Job #: [id number]

## 2010-05-31 NOTE — Assessment & Plan Note (Signed)
[name] [facility]                            CARDIOLOGY OFFICE NOTE   Alicia Guerrero, Alicia Guerrero                         MRN:          [id number]  DATE:07/19/2006                            DOB:          [birth year]    Alicia Guerrero returns today for management of the following issues:  1. Coronary artery disease.  She is currently asymptomatic and is      fairly active, walking.  She has lost 15+ pounds and has kept them      off.  She looks remarkably good.  She is due a stress Myoview, last      one being May 19, 2004.  Ejection fraction 67%, no ischemia.  2. Hypertension, under much better control on amlodipine and losing      her weight.  3. Lower extremity edema, actually improved and maintaining.  4. Mixed hyperlipidemia.  Her lipids on July 11, 2006, total      cholesterol 160, HDL 49.8, LDL 72, VLDL 39, triglycerides 193.  She      admits she slipped a little bit with her diet.  Her liver function      tests were normal.  Her fasting blood sugar was 110.  Potassium and      creatinine normal.   MEDICATIONS:  Are listed in the maintenance medication list and include  Vytorin 10/40 mg night, Lasix 40 mg a day, [name] 4 mg a day, aspirin 325  mg a day, Norvasc 10 mg a day.  She needs renewal on all of these.  We  will call them in.   EXAMINATION:  She looks remarkable good.  Skin color is normal.  Her  blood pressure is 138/72, pulse 60 and regular, weight is 189.  HEENT:  Normocephalic, atraumatic, PERRLA, extraocular movements intact,  sclerae clear, facial symmetry is normal.  NECK:  Supple, carotids are full without bruits.  There is no jugular  venous distension, thyroid is not enlarged, trachea is midline.  LUNGS:  Clear.  HEART:  Reveals a soft S1, S2, PMI can not be appreciated.  She has a  soft systolic murmur, S2 splits physiologically.  ABDOMEN:  Soft, good bowel sounds, no midline bruits, no hepatomegaly.  EXTREMITIES:  No cyanosis or clubbing, there  is trace edema, pulses are  intact.  NEURO:  Intact.  SKIN:  Unremarkable.   Electrocardiogram is stable showing normal sinus rhythm, poor R-wave  progression across the anterior precordium.   Alicia Guerrero is doing well.  I have made no changes in her program.  We  renewed all of her cardiac medications.  I have encouraged her to  continue to keep her weight down, which she has done remarkably well  with.  She will continue to do her walking program.   She is due a stress Myoview which we will arrange over the next few  weeks.  Assuming this is negative for any ischemia, we will see her back  in a year.     Thomas C. Daleen Squibb, MD, [facility]  Electronically Signed    TCW/MedQ  DD: 07/18/2006  DT: 07/19/2006  Job #: [id number]   cc:   Pierce Crane, M.D.
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
## 2010-05-31 NOTE — Assessment & Plan Note (Signed)
[facility]                            CARDIOLOGY OFFICE NOTE   QUINESHA, SELINGER                         MRN:          [id number]  DATE:11/18/2007                            DOB:          1935/04/20    PRIMARY CARE PHYSICIAN:  [name], MD   PRIMARY CARDIOLOGIST:  Jesse Sans. Wall, MD, [facility]   CHIEF COMPLAINT:  Chest pain.   HISTORY OF PRESENT ILLNESS:  Ms. Alicia Guerrero is a 75 year old woman who walked  in the office today complaining of chest burning into the left arm as  well as in the left axillary area.  Her symptoms have been similar to  past episodes of angina, but she describes them as less severe.  She  felt fine when she woke up this morning, but, after having some  difficulty swallowing her pills, she developed sudden onset of heart  racing, shortness of breath and chest burning.  She was in the area with  her husband and they decided to walk into the clinic because of these  symptoms.  Her symptoms just started approximately 1 hour ago.  An EKG  was done on her arrival and shows atrial fibrillation with rapid  ventricular response at 155 beats per minute.  The patient also has left  bundle branch block on EKG which has been described in the past.  She  denies lightheadedness or near syncope.  She has had no edema or other  complaints.  She has been sedentary over the last few weeks and, again,  denies any history of similar symptoms in the recent past.   PAST MEDICAL HISTORY:  1. Coronary artery disease.  Stress Myoview scan July 2008 was low      risk with either mild anterior ischemia versus breast attenuation.      The cardiac catheterization from 2001 showed nonobstructive disease      in the circumflex and right coronary artery and diffuse disease in      the mid LAD which was treated with cutting balloon angioplasty.      Her right coronary artery has been stented and the stent was widely      patent at the time of her previous  catheterization.  2. Hypertension:  Treatment with Norvasc and [name] with good blood      pressure control.  3. Dyslipidemia:  The patient is on Vytorin.  Last lipids from on July      2009 showed cholesterol of 138, triglycerides 158, HDL 48, LDL 58.  4. History of breast cancer with continued treatment on Arimidex.  5. TAH-BSO.  6. Cholecystectomy.   HOME MEDICATIONS:  1. Vytorin  10/40 mg daily.  2. Benadryl at bedtime.  3. Arimidex 0.1 mg daily.  4. Lasix 40 mg daily.  5. Multivitamin daily.  6. [name] 4 mg daily.  7. Aspirin 325 mg daily.  8. Norvasc 10 mg daily.  9. Calcium Plus D 600 mg twice daily.  10.Klor-Con 20 mEq twice daily.   ALLERGIES:  NKDA.   DRUG INTOLERANCES:  DEMEROL and PHENERGAN.   SOCIAL  HISTORY:  The patient is married.  She lives in [place].  She  is a former Human resources officer at [employer].  She does not smoke cigarettes or drink  alcohol.   FAMILY HISTORY:  Pertinent positives include ovarian cancer in her  mother and sister.  No premature CAD in the family.   REVIEW OF SYSTEMS:  A 12 point review of systems was performed.  There  are no pertinent positives to report except as outlined above.   PHYSICAL EXAM:  The patient is alert and oriented.  She is in no acute  distress.  Blood pressure is 164/98 in the left arm, heart rate 155, respiratory  rate is 18.  HEENT:  Normal.  NECK:  Normal carotid upstrokes.  No bruits.  JVP normal.  No  thyromegaly or thyroid nodules.  LUNGS:  Clear bilaterally.  HEART:  The apex is discrete and nondisplaced.  Heart sounds are  tachycardiac and irregular without murmurs or gallops.  ABDOMEN:  Soft,  obese, nontender no organomegaly.  BACK:  No CVA tenderness.  EXTREMITIES:  No clubbing, cyanosis or edema.  Peripheral pulses are 2+  and equal throughout.  SKIN:  Warm and dry without rash.  NEUROLOGIC:  Cranial nerves II-XII are intact.  Strength intact and  equal bilaterally.   EKG shows atrial fibrillation with  RVR.  Ventricular rate 155 beats per  minute.  Complete left bundle branch block.  This was compared to an EKG  from August 01, 2007 which showed normal sinus rhythm with a complete left  bundle branch block.   ASSESSMENT:  1. Atrial fibrillation with rapid ventricular response:  The patient      will be admitted to the hospital.  We called and there is a delay      for  telemetry beds; therefore, she was sent to the emergency room.      Orders were written for her to start on a Diltiazem drip as well as      a heparin drip in case she requires some cardioversion.  Her only      concomitant risk factor for stroke is hypertension.  We will defer      decision regarding long-term Coumadin to Dr. Daleen Squibb who is her      primary cardiologist.  We will check a TSH and 2-D echocardiogram      to evaluate for potential causes of her new onset atrial      fibrillation.  Also, we will cycle cardiac enzymes.  2. Coronary artery disease with prior PCI:  The patient's symptoms      sound consistent with angina, but I suspect this is secondary to      demand ischemia with her rapid heart rate.  We will see how she      responds to rate slowing medication and determine further treatment      from there.  We will continue aspirin and cycle enzymes as above.      We will use a calcium blocker instead of a beta-blocker since she      has a history of reactive airway disease.  3. Hypertension:  We will hold her Norvasc as anticipated need for      Diltiazem.  Continue [name] as her blood pressure tolerates.     Veverly Fells. Excell Seltzer, MD  Electronically Signed    MDC/MedQ  DD: 11/18/2007  DT: 11/18/2007  Job #: [id number]   cc:   [name], MD  Jesse Sans. Wall,  MD, [facility]

## 2010-05-31 NOTE — Discharge Summary (Signed)
NAME:  Alicia Guerrero, Alicia Guerrero NO.:  [id number]   MEDICAL RECORD NO.:  [id number]          PATIENT TYPE:  INP   LOCATION:  2036                         FACILITY:  [facility]   PHYSICIAN:  Jesse Sans. Wall, MD, FACCDATE OF BIRTH:  1935/09/07   DATE OF ADMISSION:  11/18/2007  DATE OF DISCHARGE:  11/19/2007                               DISCHARGE SUMMARY   PROCEDURES:  None.   PRIMARY FINAL DISCHARGE DIAGNOSIS:  Chest pain.   SECONDARY DIAGNOSES:  1. History of stage I carcinoma of the right breast status post      lumpectomy and re-excision.  2. 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  3. History of percutaneous transluminal coronary angioplasty and      Cypher stent to the left anterior descending in November 2003.  4. Status post percutaneous transluminal coronary angioplasty and      stent to the right coronary artery in 1996.  5. Status post percutaneous transluminal coronary angioplasty with      cutting balloon to the left anterior descending in 2001.  6. History of sinus bradycardia.  7. Status post cholecystectomy and partial hysterectomy.  8. Remote history of non-Q-wave myocardial infarction.  9. Dyslipidemia.  10.History of a junctional rhythm.  11.Hypertension.  12.History of hyperlipidemia, well controlled on statin therapy.  13.Degenerative joint disease.  14.Allergy or intolerance to DEMEROL and PHENERGAN.   TIME AT DISCHARGE:  Thirty seven minutes.   HOSPITAL COURSE:  [name] is a 75 year old female with known coronary  artery disease.  Her last Myoview was in 2008, which showed either mild  anterior ischemia or breast attenuation.  She was seen by Dr. Excell Seltzer on  November 18, 2007, and was found to be complaining of chest burning.  She  was in atrial fibrillation with rapid ventricular response.  She was  admitted for further evaluation and treatment.   She spontaneously converted to sinus rhythm.  Her Norvasc was  held in  anticipation of starting a nondihydropyridine calcium channel blocker.  She had had some bradycardia secondary to verapamil, so Cardizem was  selected.   Once she converted, she had no further episodes of chest pain.  She had  a minimal elevation in her troponin of 0.08 but all CK-MBs were  negative.  TSH was within normal limits at 1.9.  Chest x-ray showed  cardiomegaly but no acute cardiopulmonary disease.   On November 19, 2007, she was seen by Dr. Daleen Squibb.  Since she had converted  to sinus rhythm and was now asymptomatic, he felt that outpatient stress  testing and echocardiogram were appropriate.  He evaluated [name] and  considered her stable for discharge on November 19, 2007.   DISCHARGE INSTRUCTIONS:  1. Her activity level is to be increased gradually.  2. She is to call our office for any further palpitations or chest      pain.  3. She is to get a stress test on November 27, 2007, and an      echocardiogram on November 27, 2007, as  well.  4. She is to follow up with Dr. Daleen Squibb on December 10, 2007.  She is to      follow up with Dr. Debby Bud as needed.   DISCHARGE MEDICATIONS:  1. Cardizem CD 240 mg daily.  2. Aspirin 325 mg daily.  3. Vytorin 10/40 daily,  4. Benadryl nightly p.r.n.  5. Arimidex 0.1 mg nightly.  6. Lasix 40 mg a day.  7. [name] 4 mg a day.  8. Norvasc is discontinued.  9. Multivitamin and calcium daily.      Theodore Demark, PA-C      Jesse Sans. Daleen Squibb, MD, [facility]  Electronically Signed    RB/MEDQ  D:  11/19/2007  T:  11/19/2007  Job:  [id number]   cc:   [name], MD

## 2010-06-02 NOTE — Cardiovascular Report (Signed)
NAME:  Alicia Guerrero, SPAUGH NO.:  [id number]  MEDICAL RECORD NO.:  [id number]           PATIENT TYPE:  LOCATION:                                 FACILITY:  PHYSICIAN:  Veverly Fells. Excell Seltzer, MD  DATE OF BIRTH:  03/18/1935  DATE OF PROCEDURE:  05/09/2010 DATE OF DISCHARGE:                           CARDIAC CATHETERIZATION   PROCEDURES: 1. Left heart catheterization. 2. Selective coronary angiography. 3. Left ventricular angiography. 4. Percutaneous transluminal coronary angioplasty and stenting of the     distal right coronary artery.  PROCEDURAL INDICATION:  Ms. Edmonia Lynch is a 75 year old woman with known CAD. She has undergone a remote stenting of the distal right coronary artery in 2001 and then was treated with a drug-eluting stent in the LAD in 2003.  She presented with non-ST-elevation infarction and was referred for cardiac cath.  Risks and indications of the procedure were reviewed with the patient, informed consent was obtained.  The right groin was prepped, draped, and anesthetized with 1% lidocaine.  Using the modified Seldinger technique, a 5-French sheath was placed in the right femoral artery.  Standard Judkins catheters were used for coronary angiography and left ventriculography.  After review of the diagnostic images, we elected to proceed with PCI of the distal right coronary artery.  There was a previously placed stent that is patent with mild diffuse in-stent restenosis, but there is a high-grade 90% stenosis just beyond the stented segment extending into the PDA branch.  There was a significant amount of tortuosity in the mid and distal right coronary artery, and I suspect that delivering stent may be difficult.  Bivalirudin was started for anticoagulation.  The patient was loaded with 180 mg of Brilinta.  A JR-4 guide catheter was utilized.  Once a therapeutic ACT was achieved, a Cougar guidewire was advanced into the distal right coronary  artery. The ACT was 393 seconds.  The lesion was dilated with a 2.5 x 12-mm Apex balloon.  The balloon expanded well.  The balloon was left in place to assess lesion length, and it looked like a 16-mm stent would cover the area.  A 3.0 x 16-mm Promus Element was chosen.  I was unable to deliver the stent despite a prolonged effort.  There was too much tortuosity in the midportion of the vessel, and I could not advance the stent beyond that area.  I buddy wired the vessel with a [employer] wire but I still was unable to get the stent in.  I was able to advance a [facility] into a branch of the mid right coronary but could not direct it into the area where the lesion was.  The [facility] was removed and a 2.75 x 12-mm Apex balloon was advanced and it was used as an anchor.  The balloon was dilated to 6 atmospheres and over the balloon, I was able to advance a guide liner delivery catheter all the way down into the distal right coronary artery just proximal to the lesion.  This allowed delivery of the stent past the tortuous area in the mid vessel and across the  lesion.  The stent was carefully positioned, and it was deployed at 14 atmospheres.  Stent appeared well expanded.  The stent was postdilated with a 3.25 x 12-mm [place] Quantum Apex balloon which was taken to 16 atmospheres.  The guide liner was removed, and final angiography was performed.  There was an excellent angiographic result with TIMI-3 flow and 0% residual stenosis.  The patient tolerated the procedure well. There were no immediate complications.  PROCEDURAL FINDINGS:  Left ventricular pressure was 189/19, aortic pressure was 187/75 with a mean of 115. Coronary angiography:  The left mainstem is widely patent.  It divides into the LAD and left circumflex.  The LAD is widely patent to the apex. There is a patent stent just beyond the first diagonal branch with minimal in-stent restenosis.  The first diagonal has a mild 30%  lesion at its ostium.  The LAD just beyond the diagonal ostium and before the stented segment also has a mild 30% lesion.  There is no high-grade obstructive disease throughout the course of the LAD or the diagonal systems.  Left circumflex.  The left circumflex supplies a high obtuse marginal branch with 50% ostial stenosis.  The circumflex also supplies a second OM with wide patency.  There is mild irregularity with up to 30% stenosis in the first portion of the second OM.  Right coronary artery.  The RCA is a large, dominant vessel.  There is diffuse plaquing throughout the proximal and midportions.  The distal RCA has a stent with mild in-stent restenosis of approximately 40%. Beyond the stent as the right coronary transitions into the PDA, there is a high-grade 90% eccentric stenosis present.  Left ventriculography.  This demonstrates significant LV dysfunction. There is anterolateral hypokinesis and basal inferior severe hypokinesis.  The left ventricular ejection fraction is estimated at 40%.  FINAL ASSESSMENT: 1. Severe right posterior descending artery stenosis with successful     percutaneous intervention using a drug-eluting stent platform. 2. Patency of the mid left anterior descending stent with mild     nonobstructive disease. 3. Nonobstructive left circumflex stenosis. 4. Moderate segmental left ventricular systolic dysfunction.  RECOMMENDATIONS:  The patient should continue with aggressive medical therapy for her coronary disease and cardiomyopathy.  She should remain on dual-antiplatelet therapy with aspirin and ticagrelor for at least 12 months.  We will discuss her ability to continue ticagrelor versus converting her to Plavix if necessary.     Veverly Fells. Excell Seltzer, MD     MDC/MEDQ  D:  05/28/2010  T:  05/28/2010  Job:  [id number]  Electronically Signed by Tonny Bollman MD on 06/02/2010 03:00:24 PM

## 2010-06-03 ENCOUNTER — Other Ambulatory Visit: Payer: Self-pay | Admitting: Cardiology

## 2010-06-03 MED ORDER — CLOPIDOGREL BISULFATE 75 MG PO TABS: 75.0000 mg | ORAL_TABLET | Freq: Every day | ORAL | Status: DC

## 2010-06-03 NOTE — Cardiovascular Report (Signed)
NAME:  Alicia Guerrero, Alicia Guerrero                            ACCOUNT NO.:  [id number]   MEDICAL RECORD NO.:  [id number]                   PATIENT TYPE:  OIB   LOCATION:  6525                                 FACILITY:  [facility]   PHYSICIAN:  Charlies Constable, M.D. [facility]              DATE OF BIRTH:  [birth year]   DATE OF PROCEDURE:  12/05/2001  DATE OF DISCHARGE:                              CARDIAC CATHETERIZATION   PROCEDURE:  Cardiac catheterization.   CARDIOLOGIST:  Charlies Constable, M.D.   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  She had stenting of the distal right  coronary artery in 1995 and she had cutting balloon angioplasty of the LAD  in 2001.  She recently developed symptoms with shortness of breath with  exertion and was also scheduled for surgery for an ovarian cyst by Dr.  Ambrose Mantle.  She had a Cardiolite scan which showed anterior ischemia.  For this  reason. she was scheduled for evaluation angiography.   DESCRIPTION OF PROCEDURE:  The procedure was performed via the right femoral  artery using an arterial sheath and 6 French preformed coronary catheters.  A front wall arterial puncture was performed and Omnipaque contrast was  used.  After completion of the diagnostic study, I made a decision to do  intervention on the LAD.   The patient was given Angiomax bolus and infusion and received 300 mg of  Plavix at the end of the procedure.  We used a JL4 6 [place] guiding catheter  with side holes and a short floppy wire.  We crossed the lesion in the  proximal LAD over the wire without difficulty.  We direct stented with a 2.5  x 18 mm Cypher stent.  We took great care to position the proximal edge of  the stent just before a diagnostic branch which had a 50% ostial stenosis.  This was somewhat difficult because the stent moved with the heartbeat.  Nevertheless, we were able to accomplish this and we deployed this with one  inflation of 10 atmospheres for 35 seconds.  We then pulse dilated with a  2.5 x 50 mm PowerSail performing one inflation at 12 atmospheres for 30  seconds avoiding the proximal edge where the reference diameter was smaller.  The patient tolerated the procedure well and left the laboratory in  satisfactory condition.   RESULTS:  1. The left main coronary artery was free of significant disease.   1. Left anterior descending:  The left anterior descending artery gave rise     to a large diagonal branch, a large septal perforator and a smaller     septal perforator.  There was 80% stenosis after the septal perforator.     There was some segmental disease that extended proximally to just near     the septal perforator.  There was  40% ostial stenosis of the first large     diagonal branch.   1. The circumflex artery:  The circumflex artery gave rise to a large     marginal branch and a posterior lateral branch.  There was 50% ostial     stenosis in the marginal branch.   1. Right coronary artery:  The right coronary is a moderately large vessel     that gave rise to a right ventricular branch, a small posterior     descending branch and a large posterior lateral branch.  There was 40%     narrowing in the mid right coronary artery.  There was less than 10%     narrowing at the stent site in the posterior lateral branch.   1. LEFT VENTRICULOGRAPHY:  The left ventriculogram was performed in the RAO  projection showed good wall motion but no areas of hypokinesis.  The  estimated ejection fraction was 60%.   Following stenting of the lesion in the proximal LAD the stenosis improved  from 80% to less than 10%.   CONCLUSION:  1. Coronary artery disease status post previous percutaneous intervention as     described above with 80% narrowing in the proximal left anterior     descending, 40% ostial narrowing of the first large diagonal branch, 50%     ostial narrowing of the marginal branch of  the circumflex artery, 40%     narrowing in the mid right coronary artery, less than 10% narrowing at     the stent site and the posterior lateral branch of the right coronary     artery and normal left ventricular function.  2. Successful stenting of the proximal left anterior descending lesion with     improvement in percent diameter narrowing from 80% to less than 10% with     a Cypher stent.   DISPOSITION:  DISPOSITION:  The patient was returned to the postangioplasty  unit for further observation.  She will need Plavix for three months.                                               Charlies Constable, M.D. [facility]    BB/MEDQ  D:  12/05/2001  T:  12/05/2001  Job:  [id number]   cc:   Jesse Sans. Daleen Squibb, M.D. [facility]   [name], M.D. [facility]   Cardiopulmonary Lab   Malachi Pro. Ambrose Mantle, M.D.  [address]  [place]  [place] [address]  Fax: [phone]

## 2010-06-03 NOTE — H&P (Signed)
NAME:  Alicia Guerrero, RAWLINSON                            ACCOUNT NO.:  [id number]   MEDICAL RECORD NO.:  [id number]                   PATIENT TYPE:  INP   LOCATION:  NA                                   FACILITY:  [facility]   PHYSICIAN:  Malachi Pro. Ambrose Mantle, M.D.              DATE OF BIRTH:  1935-07-02   DATE OF ADMISSION:  DATE OF DISCHARGE:                                HISTORY & PHYSICAL   HISTORY OF PRESENT ILLNESS:  This is a 75 year old white married female,  para 2-0-0-2  who was admitted to the hospital for surgical exploration of  a left adnexal mass. The patient has had a hysterectomy, and on a CT scan  for hematuria in October 2003, she was noted to have an adnexal mass on the  left. The left ovary was felt to be 3.7 x 3.4 cm. Sonographic followup was  recommended. An ultrasound was done on November 18, 2001, and it showed a  complex cystic lesion within the left ovary containing echogenic debris and  showed several areas of nodularity of the wall. It was considered suspicious  for neoplasm and the patient was recommended surgery.   She was scheduled for surgery, but  the day prior to  surgery began having  come chest discomfort and was admitted to the hospital and underwent  coronary catheterization and stenting of the anterior descending coronary  artery. The patient was  advised to take Plavix for 3 months, however,  because of the uncertainty of the left ovary, Dr. Juanda Chance gave permission for  her to proceed with surgery now, which is approximately 6 weeks after the  procedure.   ALLERGIES:  THE PATIENT HAS A POSSIBLE ALLERGY TO PENICILLIN.   PAST SURGICAL HISTORY:  1. Vaginal  hysterectomy with a bladder suspension in 1975.  2. Cholecystectomy, uncertain which year, but she thinks it was 1994.  3. Heart stent placed in 1996 and angioplasty in 2001 and another heart     stent in 2003.   PAST MEDICAL HISTORY:  1. High blood pressure.  2. Myocardial infarction.  3. Arthritis.   HABITS:  She does not drink, she does not smoke.   REVIEW OF SYSTEMS:  She has some shortness of breath.   MEDICATIONS:  1. Calan 240 mg b.i.d.  2. [name] 4 mg q.d.  3. Lasix 40 mg b.i.d.  4. Claritin p.r.n.  5. Plavix.  6. Caltrate.  7. Benadryl p.r.n.  8. Vioxx 25 mg q.d.  9. Lipitor 20 mg q.d.  10.      Aspirin 325 mg q.d.  11.      Potassium 20 mg b.i.d.  She has not taken the Plavix, aspirin or Vioxx for the last 4 days.   FAMILY HISTORY:  Her mother died at 23 of uterine cancer and high blood  pressure. Her father died of 22 of heart problems. Two sisters, one has  heart problems and one has had endometrial cancer. Two brothers are living  with heart problems. One brother died from a blood clot. She states that he  was a long distance truck driver and thought that this contributed to his  pulmonary embolus and the sister with endometrial cancer also had an  embolus. The patient has never had venous thromboembolic diagnosis.   PHYSICAL EXAMINATION:  GENERAL:  A well developed, obese white female in no  distress. Weight 203 pounds.  VITAL SIGNS:  Pulse 48, blood pressure 154/72.  HEENT:  Reveals on cranial abnormalities. Extraocular movements intact. Nose  and pharynx are clear. There are several carious teeth on the left lower  that are carious to the gumline.  NECK:  Supple without thyromegaly.  HEART:  Normal size, rate 48. There is a 2/6 systolic ejection murmur.  LUNGS:  Clear to percussion and auscultation.  BREASTS:  Soft without masses.  ABDOMEN:  Soft, obese and nontender.  GENITOURINARY:  The vulva and vagina are clean. The cuff is clean. Adnexa,  no masses are felt.  RECTAL:  Examination is negative.   IMPRESSION:  1. Suspicious left adnexal mass by ultrasound.  2. Hypertension. Prior history of MI, status post 3 coronary procedures,     including 2 stents and 1 angioplasty. Sinus bradycardia.   PLAN:  The patient is admitted for surgical exploration. She  has been  informed of the risks of surgery, including but not limited to heart attack,  stroke, pulmonary embolus, wound disruption, hemorrhage with need for  reoperation and/or transfusion, fistula formation, nerve injury and  intestinal obstruction. She has also been informed in case the neoplasm is  malignant, that Dr. Magnus Ivan is standing by to do an omentectomy,  lymphadenopathy, peritoneal biopsies and further procedures as necessary.  She understands and agrees to proceed.                                               Malachi Pro. Ambrose Mantle, M.D.    TFH/MEDQ  D:  01/16/2002  T:  01/17/2002  Job:  [id number]

## 2010-06-03 NOTE — Op Note (Signed)
NAME:  Alicia Guerrero, Alicia Guerrero                            ACCOUNT NO.:  [id number]   MEDICAL RECORD NO.:  [id number]                   PATIENT TYPE:  AMB   LOCATION:  DSC                                  FACILITY:  [facility]   PHYSICIAN:  Rose Phi. Maple Hudson, M.D.                DATE OF BIRTH:  01-09-1936   DATE OF PROCEDURE:  09/29/2003  DATE OF DISCHARGE:                                 OPERATIVE REPORT   PREOPERATIVE DIAGNOSIS:  Stage I carcinoma of the right breast.   POSTOPERATIVE DIAGNOSIS:  Stage I carcinoma of the right breast.   OPERATION:  Re-excision of a lumpectomy site for margins.   SURGEON:  Rose Phi. Maple Hudson, M.D.   ANESTHESIA:  General.   DESCRIPTION OF PROCEDURE:  After suitable general anesthesia was induced,  the patient was placed in the supine position with the right arm extended on  the arm board, and the right breast prepped and draped in the usual fashion.  In incised the previous lumpectomy incision and entered the seroma cavity,  and suctioned it and then thoroughly irrigated it out.  I then excised the  whole scar, beginning at about 2 o'clock, and extended all the way around  through 12, 10, 8, and down to the 6 o'clock position, to get better margins  at cranial and lateral and deep.  The specimen was oriented for the  pathologist.  We had good hemostasis.  I thoroughly irrigated the field with  saline.  It was then closed in a single layer of subcuticular #4-0 Monocryl  and the skin glue.  A dressing was applied.  The patient was transferred to the recovery room in satisfactory condition,  having tolerated the procedure well.                                               Rose Phi. Maple Hudson, M.D.    PRY/MEDQ  D:  09/29/2003  T:  09/29/2003  Job:  [id number]

## 2010-06-03 NOTE — Assessment & Plan Note (Signed)
[facility]                           GASTROENTEROLOGY OFFICE NOTE   Alicia, Guerrero                         MRN:          [id number]  DATE:10/24/2005                            DOB:          [birth year]    NEW GASTROINTESTINAL CONSULTATION:   PRIMARY CARE PHYSICIAN:  [name], M.D.   She was suggested to see [place] by her insurance company, [employer].   REASON FOR VISIT:  To discuss colorectal cancer screening.   HISTORY OF PRESENT ILLNESS:  Alicia Guerrero is a very pleasant 75 year old woman  whose insurance company, [employer], recently recommended that she  be evaluated for colorectal cancer screening with colonoscopy.  She has  never had colonoscopy before.  She has no troubles with her bowels;  specifically, no constipation, diarrhea or bleeding.  She has, however, had  hemorrhoids in the past.  She said this started when she had a very large  breech son.   REVIEW OF SYSTEMS:  Notable for stable weight; otherwise, essentially  normal, and is available on the nursing intake sheet.   PAST MEDICAL HISTORY:  1. Coronary artery disease with heart attack in 1996 and 2005; status post      angioplasty and stent in 2004.  2. Hypertension.  3. Elevated cholesterol.  4. Hyperthyroid.  5. Breast cancer, status post lumpectomy and radiation therapy in 2005.  6. Status post cholecystectomy.  7. Status post hysterectomy.  8. Status post oophorectomy.   CURRENT MEDICATIONS:  1. Vytorin.  2. Benadryl.  3. Arimidex.  4. Lasix.  5. Multivitamin.  6. [name].  7. Caltrate.  8. Aspirin.  9. Norvasc.   ALLERGIES:  1. PENICILLIN.  2. DEMEROL.   SOCIAL HISTORY:  Married with 2 sons.  Retired from [employer].  Nonsmoker, nondrinker.   FAMILY HISTORY:  Brother with Crohn's disease, although he recently passed  away.  No colon cancer in the family.   PHYSICAL EXAMINATION:  VITAL SIGNS:  Height 5 feet, 4  inches, 185 pounds.  Blood pressure 138/68, pulse 90.  CONSTITUTIONAL:  Generally well-appearing.  NEUROLOGIC:  Alert and oriented x3.  LUNGS:  Clear to auscultation bilaterally.  ABDOMEN:  Soft, nontender, nondistended.  Normal bowel sounds.  EXTREMITIES:  No lower extremity edema.  SKIN:  No rashes or lesions on visible extremities.   ASSESSMENT AND PLAN:  A 75 year old woman at routine risk for colorectal  cancer.   Current guidelines recommend colorectal cancer screening beginning at age  17.  I will arrange for her to have full screening colonoscopy at her  soonest convenience.  I see no reason for any further blood tests or imaging  studies prior to then.            ______________________________  Rachael Fee, MD      DPJ/MedQ  DD:  10/24/2005  DT:  10/26/2005  Job #:  [id number]

## 2010-06-14 ENCOUNTER — Encounter: Payer: Self-pay | Admitting: Cardiology

## 2010-06-21 ENCOUNTER — Encounter (INDEPENDENT_AMBULATORY_CARE_PROVIDER_SITE_OTHER): Payer: Self-pay | Admitting: General Surgery

## 2010-06-24 ENCOUNTER — Encounter: Payer: Self-pay | Admitting: Cardiology

## 2010-06-24 ENCOUNTER — Ambulatory Visit (INDEPENDENT_AMBULATORY_CARE_PROVIDER_SITE_OTHER): Payer: Medicare Other | Admitting: Cardiology

## 2010-06-24 DIAGNOSIS — I2510 Atherosclerotic heart disease of native coronary artery without angina pectoris: Secondary | ICD-10-CM

## 2010-06-24 NOTE — Assessment & Plan Note (Signed)
Stable. No change in medical therapy. I will see back in 4 months.

## 2010-06-24 NOTE — Progress Notes (Signed)
HPI Alicia Guerrero returns for followup of her coronary disease and recent non-STEMI. He said no further ischemic symptoms.  She offers no complaints today including palpitations, presyncope, dyspnea on exertion. Past Medical History  Diagnosis Date  . Left bundle branch block   . Atrial fibrillation   . Acute myocardial infarction, unspecified site, episode of care unspecified   . Coronary atherosclerosis   . Pure hypercholesterolemia   . Unspecified essential hypertension   . Cough   . Other atopic dermatitis and related conditions   . Acute upper respiratory infections of unspecified site   . Allergic rhinitis due to pollen   . Unspecified asthma   . Other and unspecified ovarian cyst   . Personal history of malignant neoplasm of breast   . Personal history of hyperthyroidism   . Chronic eczema     hands  . Hx of coronary angioplasty     percutaneous transluminal  . Cancer     Past Surgical History  Procedure Date  . Oophorectomy   . Hysterectomy,history   . Abdominal hysterectomy   . Ptca   . Right lumpectomy     with node dissection/ radiation tx  . Cholecystectomy     Family History  Problem Relation Age of Onset  . Cancer Mother   . Hypertension Mother   . Coronary artery disease Father   . Heart disease Sister   . Heart disease Brother   . Endometrial cancer Sister   . Cancer Sister   . Heart disease Brother   . Clotting disorder Brother     blood clot    History   Social History  . Marital Status: Married    Spouse Name: N/A    Number of Children: 2  . Years of Education: N/A   Occupational History  . Not on file.   Social History Main Topics  . Smoking status: Never Smoker   . Smokeless tobacco: Never Used  . Alcohol Use: No  . Drug Use: No  . Sexually Active: Not on file   Other Topics Concern  . Not on file   Social History Narrative  . No narrative on file    Allergies  Allergen Reactions  . Meperidine Hcl     Mixed with  phenergan  . Penicillins     Current Outpatient Prescriptions  Medication Sig Dispense Refill  . amLODipine (NORVASC) 5 MG tablet Take 1 tablet (5 mg total) by mouth daily.  30 tablet  1  . anastrozole (ARIMIDEX) 1 MG tablet Take 1 mg by mouth daily.        Marland Kitchen aspirin 81 MG tablet Take 325 mg by mouth daily.       Marland Kitchen atorvastatin (LIPITOR) 40 MG tablet Take 40 mg by mouth daily.        . Calcium Carbonate-Vitamin D (CALTRATE 600+D) 600-400 MG-UNIT per tablet Take 2 tablets by mouth daily.        . Calcium-Vitamin D (CALTRATE 600 PLUS-VIT D PO) Take 2 tablets by mouth daily.        . carvedilol (COREG) 12.5 MG tablet Take 1 tablet (12.5 mg total) by mouth 2 (two) times daily.  180 tablet  3  . clopidogrel (PLAVIX) 75 MG tablet Take 1 tablet (75 mg total) by mouth daily.  90 tablet  3  . furosemide (LASIX) 40 MG tablet Take 40 mg by mouth daily.        . Multiple Vitamin (MULTIVITAMIN) tablet Take 1 tablet by  mouth daily.        . nitroGLYCERIN (NITROSTAT) 0.4 MG SL tablet Place 0.4 mg under the tongue every 5 (five) minutes as needed.        . potassium chloride SA (K-DUR,KLOR-CON) 20 MEQ tablet Take 20 mEq by mouth daily.        . trandolapril ([name]) 4 MG tablet Take 4 mg by mouth daily.        Marland Kitchen DISCONTD: diltiazem (DILT-CD) 300 MG 24 hr capsule Take 300 mg by mouth daily.          ROS Negative other than HPI.   PE General Appearance: well developed, well nourished in no acute distress, obese HEENT: symmetrical face, PERRLA, good dentition  Neck: no JVD, thyromegaly, or adenopathy, trachea midline Chest: symmetric without deformity Cardiac: PMI non-displaced, RRR, normal S1, S2, no gallop or murmur Lung: clear to ausculation and percussion Vascular: all pulses full without bruits  Abdominal: nondistended, nontender, good bowel sounds, no HSM, no bruits Extremities: no cyanosis, clubbing or edema, no sign of DVT, no varicosities  Skin: normal color, no rashes Neuro: alert and  oriented x 3, non-focal Pysch: normal affect Filed Vitals:   06/24/10 1049  BP: 158/72  Pulse: 58  Height: 5\' 4"  (1.626 m)  Weight: 171 lb 1.9 oz (77.62 kg)    EKG  Labs and Studies Reviewed.   Lab Results  Component Value Date   WBC 7.7 05/10/2010   HGB 13.3 05/10/2010   HCT 40.5 05/10/2010   MCV 79.9 05/10/2010   PLT 307 05/10/2010      Chemistry      Component Value Date/Time   NA 143 05/26/2010 1551   K 3.9 05/26/2010 1551   CL 103 05/26/2010 1551   CO2 32 05/26/2010 1551   BUN 15 05/26/2010 1551   CREATININE 0.9 05/26/2010 1551      Component Value Date/Time   CALCIUM 10.4 05/26/2010 1551   ALKPHOS 72 03/21/2010 0925   AST 22 03/21/2010 0925   ALT 21 03/21/2010 0925   BILITOT 0.5 03/21/2010 0925       Lab Results  Component Value Date   CHOL 160 03/21/2010   CHOL 138 07/14/2009   CHOL 122 07/14/2008   Lab Results  Component Value Date   HDL 49.10 03/21/2010   HDL 49.20 07/14/2009   HDL 09.81 07/14/2008   Lab Results  Component Value Date   LDLCALC 52 07/14/2009   LDLCALC 48 07/14/2008   LDLCALC 58 08/01/2007   Lab Results  Component Value Date   TRIG 223.0* 03/21/2010   TRIG 183.0* 07/14/2009   TRIG 131.0 07/14/2008   Lab Results  Component Value Date   CHOLHDL 3 03/21/2010   CHOLHDL 3 07/14/2009   CHOLHDL 3 07/14/2008   No results found for this basename: HGBA1C   Lab Results  Component Value Date   ALT 21 03/21/2010   AST 22 03/21/2010   ALKPHOS 72 03/21/2010   BILITOT 0.5 03/21/2010   No results found for this basename: TSH

## 2010-06-24 NOTE — Patient Instructions (Addendum)
Your physician recommends that you schedule a follow-up appointment in: 4 months with Dr. Daleen Squibb Your physician has recommended you make the following change in your medication:  It is okay to take tylenol pm for sleep.  It is also okay to take Aleve for your arthritis

## 2010-06-29 ENCOUNTER — Telehealth: Payer: Self-pay | Admitting: Cardiology

## 2010-06-29 NOTE — Telephone Encounter (Signed)
Spoke with patient---She wants Dr Daleen Squibb to send a note to excuse her from jury duty.  Summons is for 07/25/10  Needs notice for excuse 5 days prior to 07/25/10.  Let patient know I would forward to Dr Daleen Squibb and Westly Pam but that Dr Daleen Squibb not back in [facility] for a while.  He is in the [place] next Wednesday and may look at this then.  She is going to mail a copy of the summons to [place] so we can attach with the excuse.  Patient wants a copy as well

## 2010-06-29 NOTE — Telephone Encounter (Signed)
Pt got a notice for jury duty, wants to talk to a nurse

## 2010-07-14 ENCOUNTER — Encounter: Payer: Self-pay | Admitting: *Deleted

## 2010-07-15 ENCOUNTER — Encounter: Payer: Self-pay | Admitting: *Deleted

## 2010-07-15 NOTE — Telephone Encounter (Signed)
LMTCB Debbie Lucianna Ostlund RN  

## 2010-07-15 NOTE — Telephone Encounter (Signed)
Pt aware she was not exempt from jury duty.  Letter mailed out to patient and returned request to pt. Mylo Red RN

## 2010-07-28 ENCOUNTER — Other Ambulatory Visit: Payer: Self-pay | Admitting: Oncology

## 2010-07-28 ENCOUNTER — Encounter (HOSPITAL_BASED_OUTPATIENT_CLINIC_OR_DEPARTMENT_OTHER): Payer: Medicare Other | Admitting: Oncology

## 2010-07-28 DIAGNOSIS — C50419 Malignant neoplasm of upper-outer quadrant of unspecified female breast: Secondary | ICD-10-CM

## 2010-07-28 DIAGNOSIS — Z170 Estrogen receptor positive status [ER+]: Secondary | ICD-10-CM

## 2010-07-28 LAB — LACTATE DEHYDROGENASE: LDH: 162 U/L (ref 94–250)

## 2010-07-28 LAB — CBC WITH DIFFERENTIAL/PLATELET
BASO%: 0.5 % (ref 0.0–2.0)
Eosinophils Absolute: 0.5 10*3/uL (ref 0.0–0.5)
HCT: 37.6 % (ref 34.8–46.6)
LYMPH%: 25.5 % (ref 14.0–49.7)
MCHC: 33.5 g/dL (ref 31.5–36.0)
MONO#: 0.5 10*3/uL (ref 0.1–0.9)
NEUT#: 4.6 10*3/uL (ref 1.5–6.5)
NEUT%: 61.1 % (ref 38.4–76.8)
Platelets: 294 10*3/uL (ref 145–400)
WBC: 7.5 10*3/uL (ref 3.9–10.3)
lymph#: 1.9 10*3/uL (ref 0.9–3.3)

## 2010-07-28 LAB — COMPREHENSIVE METABOLIC PANEL
ALT: 17 U/L (ref 0–35)
AST: 18 U/L (ref 0–37)
CO2: 29 mEq/L (ref 19–32)
Chloride: 102 mEq/L (ref 96–112)
Sodium: 142 mEq/L (ref 135–145)
Total Bilirubin: 0.5 mg/dL (ref 0.3–1.2)
Total Protein: 7.4 g/dL (ref 6.0–8.3)

## 2010-08-04 ENCOUNTER — Encounter (HOSPITAL_BASED_OUTPATIENT_CLINIC_OR_DEPARTMENT_OTHER): Payer: Medicare Other | Admitting: Oncology

## 2010-08-14 ENCOUNTER — Emergency Department (HOSPITAL_COMMUNITY): Payer: Medicare Other

## 2010-08-14 ENCOUNTER — Inpatient Hospital Stay (HOSPITAL_COMMUNITY)
Admission: EM | Admit: 2010-08-14 | Discharge: 2010-08-17 | DRG: 249 | Disposition: A | Discharge status: Home / Self Care | Payer: Medicare Other | Attending: Internal Medicine | Admitting: Internal Medicine

## 2010-08-14 DIAGNOSIS — I10 Essential (primary) hypertension: Secondary | ICD-10-CM | POA: Diagnosis present

## 2010-08-14 DIAGNOSIS — Z853 Personal history of malignant neoplasm of breast: Secondary | ICD-10-CM

## 2010-08-14 DIAGNOSIS — E785 Hyperlipidemia, unspecified: Secondary | ICD-10-CM | POA: Diagnosis present

## 2010-08-14 DIAGNOSIS — I2589 Other forms of chronic ischemic heart disease: Secondary | ICD-10-CM | POA: Diagnosis present

## 2010-08-14 DIAGNOSIS — Z7982 Long term (current) use of aspirin: Secondary | ICD-10-CM

## 2010-08-14 DIAGNOSIS — I447 Left bundle-branch block, unspecified: Secondary | ICD-10-CM | POA: Diagnosis present

## 2010-08-14 DIAGNOSIS — I2510 Atherosclerotic heart disease of native coronary artery without angina pectoris: Principal | ICD-10-CM | POA: Diagnosis present

## 2010-08-14 DIAGNOSIS — R079 Chest pain, unspecified: Secondary | ICD-10-CM

## 2010-08-14 DIAGNOSIS — Z7902 Long term (current) use of antithrombotics/antiplatelets: Secondary | ICD-10-CM

## 2010-08-14 LAB — DIFFERENTIAL
Basophils Relative: 1 % (ref 0–1)
Lymphs Abs: 2 10*3/uL (ref 0.7–4.0)
Monocytes Absolute: 0.6 10*3/uL (ref 0.1–1.0)
Monocytes Relative: 8 % (ref 3–12)
Neutro Abs: 4.6 10*3/uL (ref 1.7–7.7)
Neutrophils Relative %: 60 % (ref 43–77)

## 2010-08-14 LAB — BASIC METABOLIC PANEL
Chloride: 102 mEq/L (ref 96–112)
Creatinine, Ser: 0.64 mg/dL (ref 0.50–1.10)
GFR calc Af Amer: >60 mL/min (ref >60)

## 2010-08-14 LAB — CBC
Hemoglobin: 13.1 g/dL (ref 12.0–15.0)
MCHC: 33 g/dL (ref 30.0–36.0)
RBC: 4.94 MIL/uL (ref 3.87–5.11)

## 2010-08-14 LAB — CARDIAC PANEL(CRET KIN+CKTOT+MB+TROPI)
Relative Index: 3.1 — ABNORMAL HIGH (ref 0.0–2.5)
Troponin I: <0.3 ng/mL (ref <0.30)

## 2010-08-14 LAB — APTT: aPTT: 29 seconds (ref 24–37)

## 2010-08-14 LAB — CK TOTAL AND CKMB (NOT AT ARMC): Relative Index: 1.8 (ref 0.0–2.5)

## 2010-08-14 LAB — PROTIME-INR
INR: 0.95 (ref 0.00–1.49)
Prothrombin Time: 12.9 seconds (ref 11.6–15.2)

## 2010-08-15 DIAGNOSIS — R079 Chest pain, unspecified: Secondary | ICD-10-CM

## 2010-08-15 LAB — HEPARIN LEVEL (UNFRACTIONATED): Heparin Unfractionated: 0.31 IU/mL (ref 0.30–0.70)

## 2010-08-15 LAB — BASIC METABOLIC PANEL
GFR calc Af Amer: >60 mL/min (ref >60)
GFR calc non Af Amer: >60 mL/min (ref >60)
Glucose, Bld: 102 mg/dL — ABNORMAL HIGH (ref 70–99)
Potassium: 3.2 mEq/L — ABNORMAL LOW (ref 3.5–5.1)
Sodium: 142 mEq/L (ref 135–145)

## 2010-08-16 DIAGNOSIS — I2510 Atherosclerotic heart disease of native coronary artery without angina pectoris: Secondary | ICD-10-CM

## 2010-08-16 LAB — CBC
Hemoglobin: 12.2 g/dL (ref 12.0–15.0)
RBC: 4.62 MIL/uL (ref 3.87–5.11)
WBC: 6.7 10*3/uL (ref 4.0–10.5)

## 2010-08-16 LAB — POCT ACTIVATED CLOTTING TIME
Activated Clotting Time: 132 seconds
Activated Clotting Time: 336 seconds

## 2010-08-16 LAB — BASIC METABOLIC PANEL
CO2: 24 mEq/L (ref 19–32)
Chloride: 109 mEq/L (ref 96–112)
Potassium: 3.9 mEq/L (ref 3.5–5.1)
Sodium: 142 mEq/L (ref 135–145)

## 2010-08-16 LAB — HEPARIN LEVEL (UNFRACTIONATED): Heparin Unfractionated: 0.53 IU/mL (ref 0.30–0.70)

## 2010-08-17 DIAGNOSIS — I200 Unstable angina: Secondary | ICD-10-CM

## 2010-08-17 LAB — BASIC METABOLIC PANEL
CO2: 27 mEq/L (ref 19–32)
Calcium: 9.5 mg/dL (ref 8.4–10.5)
Chloride: 107 mEq/L (ref 96–112)
GFR calc Af Amer: >60 mL/min (ref >60)
Sodium: 141 mEq/L (ref 135–145)

## 2010-08-17 LAB — CBC
Hemoglobin: 12.4 g/dL (ref 12.0–15.0)
MCHC: 33 g/dL (ref 30.0–36.0)
Platelets: 289 10*3/uL (ref 150–400)
RBC: 4.63 MIL/uL (ref 3.87–5.11)

## 2010-08-17 LAB — HEPARIN LEVEL (UNFRACTIONATED): Heparin Unfractionated: <0.1 IU/mL — ABNORMAL LOW (ref 0.30–0.70)

## 2010-08-23 ENCOUNTER — Encounter (INDEPENDENT_AMBULATORY_CARE_PROVIDER_SITE_OTHER): Payer: Self-pay | Admitting: General Surgery

## 2010-09-01 NOTE — Cardiovascular Report (Signed)
NAMEMarland Guerrero  Alicia Guerrero, AUGUST NO.:  [id number]  MEDICAL RECORD NO.:  [id number]  LOCATION:  6524                         FACILITY:  [facility]  PHYSICIAN:  Arturo Morton. Riley Kill, MD, FACCDATE OF BIRTH:  11/23/1935  DATE OF PROCEDURE:  08/16/2010 DATE OF DISCHARGE:                           CARDIAC CATHETERIZATION   INDICATIONS:  Alicia Guerrero is a 75 year old who presents with recurrent angina.  She has had multiple procedures in the past.  Her underlying electrocardiogram demonstrates left bundle-branch block.  Her cardiac markers are negative.  Cardiac catheterization was recommended by Dr. Daleen Squibb.  Her renal function is normal.  Risks, benefits and alternatives were discussed with the patient.  She consented to proceed.  PROCEDURES: 1. Left heart catheterization. 2. Selective coronary arteriography. 3. Selective left ventriculography. 4. Percutaneous stenting of the diagonal using a non drug-eluting     platform.  DESCRIPTION OF PROCEDURE:  The patient was brought to the cath lab, prepped and draped in the usual fashion.  We reviewed the old set of films.  Through an anterior puncture, the femoral artery was entered.  A 5-French sheath was placed.  Views of the left and right coronary arteries were obtained in multiple angiographic projections.  Central aortic and left ventricular pressures were measured with pigtail. Ventriculography was done in the RAO projection.  There were no major complications.  We then reviewed the films and compared them to the previous films.  I discussed the options with the patient.  She has a high-grade new lesion in the diagonal.  Her other lesions including the distal LAD, the circumflex, and the distal right all appear to be somewhat similar.  The distal LAD is of some concern, but there does not appear to be the acute lesion.  I discussed the options with the patient and she strongly wanted to proceed with percutaneous  intervention. Bivalirudin was then given according to protocol and an additional 225 mg of oral clopidogrel was given.  We also gave some Pepcid.  A JL-4 guiding catheter was utilized.  We were able to fashion the wire and get down the LAD and subsequently into the diagonal.  We had to use a 1.25 Mini Trek balloon in order to get the lesion predilated and then a 2-mm balloon was utilized.  Despite the desire to use a drug-eluting platform, we clearly did not feel as though the vessel was adequate in size to accommodate a 2.25-mm stent.  Therefore, a 2-mm non drug-eluting platform, a 2.0 x 15 Mini Vision was deployed to the site and then deployed at approximately 12-13 atmospheres.  We did not go up as much as we normally because the stent appeared be quite large in the artery. There was marked improvement in the vessel.  We then used 2-mm noncompliant balloon to aggressively post dilate within the stent confines.  There was marked improvement in appearance of the artery with excellent TIMI grade 3 flow downstream.  There were no major complications.  She tolerated the procedure well.  I have reviewed the pictures with her and subsequently her husband.  HEMODYNAMIC DATA: 1. The central aortic pressure was 151/69, mean 94. 2. LV pressure 166/20.  3. There was no gradient or pullback across the aortic valve.  ANGIOGRAPHIC DATA: 1. The left main coronary artery is large in caliber with some     calcium, but no critical stenosis. 2. The left anterior descending artery is widely patent proximally and     then divides into a major diagonal and a major LAD.  The ostium of     the diagonal has about 50-60% narrowing, but does not appear to be     flow limiting.  The  LAD just after the diagonal takeoff has about     50% narrowing, then opens back up, traverses down to towards the     apex, at which at the near the apex there is a 75-80% slightly     hypodense stenosis.  The apical portion  bifurcates distally.  The     major diagonal has a 95% stenosis and then divides into basically     equivalent of 4 downstream branches, all of which appear to be     about 1 to 1.5 mm in size, but covered considerable territory.     After stenting, the 90% stenosis was reduced to 0% with no evidence     of edge tear, and what appears to be an excellent angiographic     result. 3. The circumflex is a fairly large-caliber vessel with fair amount     luminal irregularity.  First marginal is without critical disease.     The AV portion leading into the second marginal has about a 70%     area of focal narrowing that looks similar to the previous film.     The downstream vessel was intact. 4. The right coronary artery is very large-caliber vessel that is     tortuous and courses distally.  Prior to the stent, and then     another stent site, there appears to be about 50% narrowing     followed by widely patent stent of less than 30%. 5. Ventriculography in the RAO projection reveals good systolic     function with low normal ejection fraction estimated at about 55%.  CONCLUSIONS: 1. Mild reduction in global left ventricular function with overall     estimated EF of about 55%. 2. New high-grade critical stenosis involving the large distribution     diagonal branch with successful percutaneous stenting using a non-     drug-eluting platform. 3. Moderately advanced disease with moderate circumflex disease,     moderate distal right coronary disease, and distal left anterior     descending disease as noted in the above text.  DISPOSITION:  The patient will be needed to be treated medically.  She eventually may progress on to requirement for revascularization surgery which she clearly wants to avoid.  We have placed a non-drug-eluting platform, so the risk of target vessel revascularization is higher.  I have explained this to the patient's husband and to the patient in some detail.  This  clearly was not big enough to accommodate a 2.25-mm stent, but the final angiographic result was excellent.  We will follow her prospectively.     Arturo Morton. Riley Kill, MD, [facility]     TDS/MEDQ  D:  08/16/2010  T:  08/17/2010  Job:  [id number]  cc:   CV Laboratory  Electronically Signed by Shawnie Pons MD [facility] on 09/01/2010 09:50:59 AM

## 2010-09-01 NOTE — Discharge Summary (Signed)
NAMEMarland Kitchen  Alicia Guerrero, Alicia Guerrero NO.:  [id number]  MEDICAL RECORD NO.:  [id number]  LOCATION:  6524                         FACILITY:  [facility]  PHYSICIAN:  Alicia Morton. Riley Kill, Alicia Guerrero, Alicia Guerrero OF BIRTH:  07-10-1935  DATE OF ADMISSION:  08/14/2010 DATE OF DISCHARGE:  08/17/2010                              DISCHARGE SUMMARY   PRIMARY CARDIOLOGIST:  Alicia Fus C. Daleen Squibb, Alicia Guerrero, [name]  PRIMARY CARE PROVIDER:  [name], Alicia Guerrero  DISCHARGE DIAGNOSIS:  Unstable angina.  SECONDARY DIAGNOSES: 1. Coronary artery disease status post prior percutaneous     interventions to the left anterior descending coronary artery and     right coronary artery with bare-metal stenting to the first     diagonal this admission. 2. History of ischemic cardiomyopathy with ejection fraction as low as     40%, measured at 55% by ventriculography this admission. 3. History of paroxysmal atrial fibrillation. 4. Chronic left bundle-branch block. 5. Hypertension. 6. Hyperlipidemia. 7. History of nonobstructive cardiovascular disease. 8. History of breast cancer status post lumpectomy.  ALLERGIES:  DEMEROL and PROMETHAZINE.  PROCEDURES: 1. Left heart cardiac catheterization performed on August 16, 2010,     revealing a 95% stenosis in the first diagonal with otherwise     diffuse multivessel coronary artery disease.  The diagonal is felt     to be the culprit for the patient's angina and this was     successfully stented with placement of 2.0 x 15 mm Mini Vision bare-     metal stent.  HISTORY OF PRESENT ILLNESS:  A 75 year old female with prior history of coronary artery disease as outlined above who was in her usual state of health until 1 day prior to admission where she began to experience exertional chest discomfort that was relieved with rest.  She then had a restless evening and night and on the day of admission approximately 12 in the afternoon, she had recurrent chest discomfort that occurred  with rest and resolved after sublingual nitroglycerin.  She did note mild associated dyspnea and also noted that pain was similar to prior angina. She presented to the [facility] ED where ECG showed no acute changes while point-of-care cardiac markers were negative.  She was admitted for evaluation and management of unstable anginal problems.  HOSPITAL COURSE:  The patient was ruled out for MI.  She had recurrent chest discomfort on the morning of August 15, 2010, with minimal activity. She remained on heparin anticoagulation.  She underwent diagnostic catheterization on the morning of August 16, 2010, revealing a new 95% stenosis within the first diagonal, and otherwise moderate multivessel coronary artery disease.  The diagonal stenosis was felt to be the culprit and this was successfully stented with a 2.0 x 15 mm Mini Vision bare-metal stent.  Medical therapy was recommended for otherwise nonobstructive disease.  EF was 55% by ventriculography.  Postprocedure, the patient has been ambulating without recurrent symptoms or limitations.  She has been seen by Cardiac Rehab, but declined outpatient referral secondary to gas prices.  She will be discharged home today in good condition.  DISCHARGE LABS:  Hemoglobin 12.4, hematocrit 37.6, WBC 9.1, platelets 289.  INR 0.95.  Sodium 141, potassium 4.0, chloride 107, CO2 27, BUN 8, creatinine 0.58, glucose 102, calcium 9.5, CK 124, MB 220, troponin I less than 0.30.  DISPOSITION:  The patient will be discharged home today in good condition.  FOLLOWUP PLANS AND APPOINTMENTS:  We have arranged for the patient to follow up with Dr. Valera Castle on September 08, 2010, at 11 a.m.  She will follow up with Dr. Illene Regulus as previously scheduled.  DISCHARGE MEDICATIONS: 1. Amlodipine 5 mg daily. 2. Nitroglycerin 0.4 mg sublingual p.r.n. chest pain. 3. Aspirin 81 mg daily. 4. Calcium citrate 1200 mg daily. 5. Cetirizine 10 mg daily p.r.n. 6.  Coreg 6.25 mg b.i.d. 7. Plavix 75 mg daily. 8. Lasix 40 mg daily. 9. Lipitor 40 mg daily. 10.Multivitamin 1 tab daily. 11.Trandolapril 4 mg daily.  OUTSTANDING LAB STUDIES:  None.  DURATION OF DISCHARGE ENCOUNTER:  40 minutes including physician time.     Alicia Guerrero, ANP   ______________________________ Alicia Morton. Riley Kill, Alicia Guerrero, [facility]    CB/MEDQ  D:  08/17/2010  T:  08/17/2010  Job:  [id number]  cc:   Alicia Gess. Norins, Alicia Guerrero  Electronically Signed by Alicia Guerrero ANP on 08/26/2010 03:31:59 PM Electronically Signed by [name] on 09/01/2010 09:51:02 AM

## 2010-09-01 NOTE — H&P (Signed)
NAME:  Alicia Guerrero, SAVARINO NO.:  [id number]  MEDICAL RECORD NO.:  [id number]  LOCATION:  2007                         FACILITY:  [facility]  PHYSICIAN:  Natasha Bence, MD       DATE OF BIRTH:  [birth year]  DATE OF ADMISSION:  08/14/2010 DATE OF DISCHARGE:                             HISTORY & PHYSICAL   PRIMARY CARDIOLOGIST:  Maisie Fus C. Wall, MD, [employer]  CHIEF COMPLAINT:  Chest pain.  HISTORY OF PRESENT ILLNESS:  Alicia Guerrero is a 75 year old white female with known coronary artery disease, has a history of angioplasty and stenting to her LAD in 2003, also with a history of coronary angioplasty to her RCA in 1996 and then subsequently had stent placed to her RCA/PDA in May 2012 who presents with chest discomfort that is typical of her angina.  She reports yesterday afternoon she had chest pain while walking.  This pain was relieved with rest.  She went to bed last night and had a rough night sleeping and said today she does felt bad in general.  Around noon today, she experienced chest discomfort while seated.  She took a sublingual nitroglycerin and the pain resolved within approximately 20 minutes.  It was associated with mild shortness of breath.  No nausea or diaphoresis.  She said this pain was identical to her previous episodes.  She has been compliant with all of her medications.  She denies any recent PND or orthopnea.  She did not have any palpitations or syncope.  Since May, she has done well and has not had chest discomfort.  Otherwise, she denies any fevers, chills, or sweats.  Has not had any bleeding or melena.  She has been able to be active without any limiting chest discomfort or shortness of breath until yesterday.  The rest of 12-point review of systems is otherwise negative.  PAST MEDICAL HISTORY: 1. Coronary artery disease, status post PCI to her LAD and her RCA. 2. Moderate LV systolic dysfunction with an ejection fraction of 40%. 3. History of  paroxysmal atrial fibrillation. 4. Chronic left bundle-branch block. 5. Dyslipidemia. 6. Hypertension. 7. Nonobstructive carotid vascular disease. 8. History of breast cancer, status post lumpectomy.  FAMILY HISTORY:  No premature coronary artery disease.  SOCIAL HISTORY:  She is married.  She does not smoke or drink alcohol or illicit drug use.  ALLERGIES:  She has no known drug allergies.  CURRENT MEDICATIONS:  She is on: 1. Coreg 6.25 mg p.o. b.i.d. 2. Plavix 75 mg daily. 3. Sublingual nitroglycerin p.r.n. 4. Aspirin 81 mg p.o. daily. 5. Calcium citrate 1200 mg p.o. daily. 6. Cetirizine 10 mg p.r.n. 7. Lasix 40 mg p.o. daily. 8. Lipitor 40 mg p.o. daily. 9. Multivitamin daily. 10.[name] 4 mg p.o. daily.  PHYSICAL EXAMINATION:  VITAL SIGNS:  She is afebrile, temperature 98.4, pulse 57 and regular, blood pressure 181/69, respiratory rate of 18, and O2 sats 96% on room air. GENERAL:  She is a well-developed white female in no apparent distress. EYES:  She has anicteric sclerae.  Pupils are equal, round, and reactiveto light. NECK:  Normal jugular pressure.  No carotid bruits. LUNGS:  Clear to auscultation  bilaterally. CARDIOVASCULAR:  Regular rhythm with the rate in the 50s.  No murmurs, rubs, or gallops. ABDOMEN:  Soft, nontender, and nondistended. EXTREMITIES:  Warm without edema.  She has symmetrical pulses throughout. NEUROLOGIC:  Grossly afocal.  Cranial nerves are intact.  Symmetrical strength. SKIN:  No rash or ulcers.  LABORATORY DATA:  PT was 12.9 and INR was 0.95.  Sodium was 141, potassium was 3.7, chloride was 102, bicarb was 29, BUN was 14, creatinine was 0.64, glucose was 132, and calcium was 10.2.  White cell count was 7.7, hematocrit was 40, and platelet count was 313.  Her chest x-ray showed no acute disease.  EKG shows sinus bradycardia with a rate of 57 beats per minute and a left bundle-branch block.  IMPRESSION AND PLAN:  This is a 75 year old  female with known coronary artery disease with a history of recent percutaneous coronary intervention who presents with chest discomfort/unstable angina.  This pain is identical to her previous anginal episodes.  We will admit her to telemetry and serial cardiac enzymes.  We will start her on heparin infusion.  In addition to aspirin and Plavix, we will continue her ACE inhibitor and Coreg and statin.  We may need up-titrate her antihypertensive depending on how her blood pressure does.  We will make her n.p.o. after midnight for possible heart catheterization in the morning.  Currently, she is pain free.          ______________________________ Natasha Bence, MD     MH/MEDQ  D:  08/14/2010  T:  08/15/2010  Job:  [id number]  Electronically Signed by Natasha Bence MD on 09/01/2010 08:32:47 PM

## 2010-09-08 ENCOUNTER — Encounter: Payer: Self-pay | Admitting: Cardiology

## 2010-09-08 ENCOUNTER — Ambulatory Visit (INDEPENDENT_AMBULATORY_CARE_PROVIDER_SITE_OTHER): Payer: Medicare Other | Admitting: Cardiology

## 2010-09-08 VITALS — BP 127/67 | HR 47 | Ht 64.0 in | Wt 171.8 lb

## 2010-09-08 DIAGNOSIS — I214 Non-ST elevation (NSTEMI) myocardial infarction: Secondary | ICD-10-CM

## 2010-09-08 DIAGNOSIS — I498 Other specified cardiac arrhythmias: Secondary | ICD-10-CM

## 2010-09-08 DIAGNOSIS — I10 Essential (primary) hypertension: Secondary | ICD-10-CM

## 2010-09-08 DIAGNOSIS — I447 Left bundle-branch block, unspecified: Secondary | ICD-10-CM

## 2010-09-08 DIAGNOSIS — I2510 Atherosclerotic heart disease of native coronary artery without angina pectoris: Secondary | ICD-10-CM

## 2010-09-08 DIAGNOSIS — E782 Mixed hyperlipidemia: Secondary | ICD-10-CM

## 2010-09-08 DIAGNOSIS — I4891 Unspecified atrial fibrillation: Secondary | ICD-10-CM

## 2010-09-08 DIAGNOSIS — I6529 Occlusion and stenosis of unspecified carotid artery: Secondary | ICD-10-CM

## 2010-09-08 LAB — BASIC METABOLIC PANEL
GFR: 87.17 mL/min (ref >60.00)
Glucose, Bld: 100 mg/dL — ABNORMAL HIGH (ref 70–99)
Potassium: 3.5 mEq/L (ref 3.5–5.1)
Sodium: 141 mEq/L (ref 135–145)

## 2010-09-08 MED ORDER — CARVEDILOL 6.25 MG PO TABS: 6.2500 mg | ORAL_TABLET | Freq: Two times a day (BID) | ORAL | Status: DC

## 2010-09-08 NOTE — Assessment & Plan Note (Signed)
Stable. Repeat carotid Dopplers in September 2013.

## 2010-09-08 NOTE — Progress Notes (Signed)
HPI Alicia Guerrero comes in today for followup of her coronary artery disease. Since her stent was placed in the right coronary artery back in April, she's done remarkably well. He's had no angina or ischemic symptoms. She is walking on a daily basis. He  During her stay her diltiazem was changed to Coreg. She is on amlodipine as well. He continues on dual antiplatelet therapy which I told her would be for a lifetime. She has small vessel disease in the LAD and also a patent stent in the proximal LAD.  She's had no palpitations or any clinical symptoms of atrial fib. She remains on aspirin. She's also had no dizziness or presyncope.  She is a baseline left bundle branch block. She is bradycardic today. She also has nonobstructive carotid disease which was last assessed in September 2011.  Her EKG today shows sinus bradycardia with a left bundle branch block. Past Medical History  Diagnosis Date  . Left bundle branch block   . Atrial fibrillation   . Acute myocardial infarction, unspecified site, episode of care unspecified   . Coronary atherosclerosis   . Pure hypercholesterolemia   . Unspecified essential hypertension   . Cough   . Other atopic dermatitis and related conditions   . Acute upper respiratory infections of unspecified site   . Allergic rhinitis due to pollen   . Unspecified asthma   . Other and unspecified ovarian cyst   . Personal history of malignant neoplasm of breast   . Personal history of hyperthyroidism   . Chronic eczema     hands  . Hx of coronary angioplasty     percutaneous transluminal  . Cancer     Past Surgical History  Procedure Date  . Oophorectomy   . Hysterectomy,history   . Abdominal hysterectomy   . Ptca   . Right lumpectomy     with node dissection/ radiation tx  . Cholecystectomy     Family History  Problem Relation Age of Onset  . Cancer Mother   . Hypertension Mother   . Coronary artery disease Father   . Heart disease Sister   .  Heart disease Brother   . Endometrial cancer Sister   . Cancer Sister   . Heart disease Brother   . Clotting disorder Brother     blood clot    History   Social History  . Marital Status: Married    Spouse Name: N/A    Number of Children: 2  . Years of Education: N/A   Occupational History  . Not on file.   Social History Main Topics  . Smoking status: Never Smoker   . Smokeless tobacco: Never Used  . Alcohol Use: No  . Drug Use: No  . Sexually Active: Not on file   Other Topics Concern  . Not on file   Social History Narrative  . No narrative on file    Allergies  Allergen Reactions  . Meperidine Hcl     Mixed with phenergan  . Penicillins     Current Outpatient Prescriptions  Medication Sig Dispense Refill  . amLODipine (NORVASC) 5 MG tablet Take 1 tablet (5 mg total) by mouth daily.  30 tablet  1  . aspirin 81 MG tablet Take 325 mg by mouth daily.       Marland Kitchen atorvastatin (LIPITOR) 40 MG tablet Take 40 mg by mouth daily.        . Calcium-Vitamin D (CALTRATE 600 PLUS-VIT D PO) Take 2 tablets by  mouth daily.        . carvedilol (COREG) 6.25 MG tablet Take 1 tablet (6.25 mg total) by mouth 2 (two) times daily.  180 tablet  3  . clopidogrel (PLAVIX) 75 MG tablet Take 1 tablet (75 mg total) by mouth daily.  90 tablet  3  . furosemide (LASIX) 40 MG tablet Take 40 mg by mouth daily.        . Multiple Vitamin (MULTIVITAMIN) tablet Take 1 tablet by mouth daily.        . nitroGLYCERIN (NITROSTAT) 0.4 MG SL tablet Place 0.4 mg under the tongue every 5 (five) minutes as needed.        . trandolapril ([name]) 4 MG tablet Take 4 mg by mouth daily.          ROS Negative other than HPI.   PE General Appearance: well developed, well nourished in no acute distress HEENT: symmetrical face, PERRLA, good dentition  Neck: no JVD, thyromegaly, or adenopathy, trachea midline Chest: symmetric without deformity Cardiac: PMI non-displaced, RRR, normal S1, paradoxical S2 no gallop or  murmur Lung: clear to ausculation and percussion Vascular: all pulses full without bruits  Abdominal: nondistended, nontender, good bowel sounds, no HSM, no bruits Extremities: no cyanosis, clubbing or edema, no sign of DVT, no varicosities  Skin: normal color, no rashes Neuro: alert and oriented x 3, non-focal Pysch: normal affect Filed Vitals:   09/08/10 1103  BP: 127/67  Pulse: 47  Height: 5\' 4"  (1.626 m)  Weight: 171 lb 12.8 oz (77.928 kg)    EKG  Labs and Studies Reviewed.   Lab Results  Component Value Date   WBC 9.1 08/17/2010   HGB 12.4 08/17/2010   HCT 37.6 08/17/2010   MCV 81.2 08/17/2010   PLT 289 08/17/2010      Chemistry      Component Value Date/Time   NA 141 08/17/2010 0550   K 4.0 08/17/2010 0550   CL 107 08/17/2010 0550   CO2 27 08/17/2010 0550   BUN 8 08/17/2010 0550   CREATININE 0.58 08/17/2010 0550      Component Value Date/Time   CALCIUM 9.5 08/17/2010 0550   ALKPHOS 70 07/28/2010 1322   ALKPHOS 70 07/28/2010 1322   ALKPHOS 70 07/28/2010 1322   AST 18 07/28/2010 1322   AST 18 07/28/2010 1322   AST 18 07/28/2010 1322   ALT 17 07/28/2010 1322   ALT 17 07/28/2010 1322   ALT 17 07/28/2010 1322   BILITOT 0.5 07/28/2010 1322   BILITOT 0.5 07/28/2010 1322   BILITOT 0.5 07/28/2010 1322       Lab Results  Component Value Date   CHOL 160 03/21/2010   CHOL 138 07/14/2009   CHOL 122 07/14/2008   Lab Results  Component Value Date   HDL 49.10 03/21/2010   HDL 49.20 07/14/2009   HDL 16.10 07/14/2008   Lab Results  Component Value Date   LDLCALC 52 07/14/2009   LDLCALC 48 07/14/2008   LDLCALC 58 08/01/2007   Lab Results  Component Value Date   TRIG 223.0* 03/21/2010   TRIG 183.0* 07/14/2009   TRIG 131.0 07/14/2008   Lab Results  Component Value Date   CHOLHDL 3 03/21/2010   CHOLHDL 3 07/14/2009   CHOLHDL 3 07/14/2008   No results found for this basename: HGBA1C   Lab Results  Component Value Date   ALT 17 07/28/2010   ALT 17 07/28/2010   ALT 17 07/28/2010   AST 18 07/28/2010  AST 18 07/28/2010   AST 18 07/28/2010   ALKPHOS 70 07/28/2010   ALKPHOS 70 07/28/2010   ALKPHOS 70 07/28/2010   BILITOT 0.5 07/28/2010   BILITOT 0.5 07/28/2010   BILITOT 0.5 07/28/2010   No results found for this basename: TSH

## 2010-09-08 NOTE — Assessment & Plan Note (Signed)
Stable. Continue current medical therapy except lower carvedilol to 6.25 mg twice a day because of her bradycardia and history of junctional rhythm

## 2010-09-08 NOTE — Patient Instructions (Addendum)
Your physician recommends that you return for lab work today to check your potassium.  We will call you with your lab results.  Your physician recommends that you schedule a follow-up appointment in: April 2013 with Dr. Daleen Squibb  Your physician has requested that you have a carotid duplex. This test is an ultrasound of the carotid arteries in your neck. It looks at blood flow through these arteries that supply the brain with blood. Allow one hour for this exam. There are no restrictions or special instructions. September 2013  Your physician has recommended you make the following change in your medication:  decrease Carvedilol  With your current prescription take 1/2 tablet twice a day until you receive new prescription

## 2010-09-13 ENCOUNTER — Encounter: Payer: Self-pay | Admitting: Cardiology

## 2010-09-15 ENCOUNTER — Ambulatory Visit (INDEPENDENT_AMBULATORY_CARE_PROVIDER_SITE_OTHER): Payer: Medicare Other | Admitting: General Surgery

## 2010-09-15 ENCOUNTER — Telehealth: Payer: Self-pay | Admitting: *Deleted

## 2010-09-15 ENCOUNTER — Encounter (INDEPENDENT_AMBULATORY_CARE_PROVIDER_SITE_OTHER): Payer: Self-pay | Admitting: General Surgery

## 2010-09-15 DIAGNOSIS — Z862 Personal history of diseases of the blood and blood-forming organs and certain disorders involving the immune mechanism: Secondary | ICD-10-CM

## 2010-09-15 DIAGNOSIS — Z8639 Personal history of other endocrine, nutritional and metabolic disease: Secondary | ICD-10-CM

## 2010-09-15 MED ORDER — POTASSIUM CHLORIDE CRYS ER 20 MEQ PO TBCR: 20.0000 meq | EXTENDED_RELEASE_TABLET | Freq: Every day | ORAL | Status: DC

## 2010-09-15 NOTE — Telephone Encounter (Signed)
Pt aware of lab results.  She will start potassium qd as recommended by Dr. Lonia Chimera RN

## 2010-09-15 NOTE — Patient Instructions (Signed)
Continue regular self exams  

## 2010-09-15 NOTE — Telephone Encounter (Signed)
Message copied by Theda Belfast on Thu Sep 15, 2010  4:30 PM ------      Message from: Valera Castle C      Created: Thu Sep 08, 2010  4:55 PM       Add KCL 20 meq a day.

## 2010-09-20 NOTE — Progress Notes (Signed)
Subjective:     Patient ID: Alicia Guerrero, female   DOB: 03-Jul-1935, 75 y.o.   MRN: [id number]  HPI The patient is a 74 year old white female who is now 7 years out from a right breast lumpectomy for a T2 N0 right breast cancer. She was ER PR positive and recently completed her course of Arimidex. Since her last visit she had a myocardial infarction and has had 2 stents placed in her heart. She has no breast pain today.Her last mammogram was in August that appeared to be benign.  Review of Systems     Objective:   Physical Exam On exam Lungs: Clear bilaterally with no use of accessory respiratory muscles Heart: Regular rate and rhythm with an impulse in the left chest Abdomen: Soft and nontender with no palpable mass or hepatosplenomegaly Breasts: She has some petechial change laterally in the right breast with some thickness to her scar that is stable. Otherwise no palpable mass in either breast. No axillary supraclavicular or cervical lymphadenopathy.   Assessment:     7 years out from a right breast lumpectomy for a T2 N0 right breast cancer    Plan:     At this point she is now off Arimidex. She will continue to do regular self checks. We will plan to see her back in one year.

## 2010-09-22 ENCOUNTER — Encounter: Payer: Self-pay | Admitting: Internal Medicine

## 2010-10-11 ENCOUNTER — Encounter: Payer: Self-pay | Admitting: Gastroenterology

## 2010-10-18 LAB — DIFFERENTIAL
Eosinophils Relative: 4
Lymphocytes Relative: 24
Lymphs Abs: 2
Monocytes Absolute: 0.7
Neutro Abs: 5.3

## 2010-10-18 LAB — CK TOTAL AND CKMB (NOT AT ARMC)
CK, MB: 3.1
Relative Index: 1.9
Total CK: 165

## 2010-10-18 LAB — CARDIAC PANEL(CRET KIN+CKTOT+MB+TROPI)
CK, MB: 2.5
Relative Index: 1.1
Relative Index: 2.1
Troponin I: 0.08 — ABNORMAL HIGH

## 2010-10-18 LAB — PROTIME-INR
INR: 0.9
Prothrombin Time: 12.1

## 2010-10-18 LAB — COMPREHENSIVE METABOLIC PANEL
AST: 33
Albumin: 4.5
Calcium: 10.2
Creatinine, Ser: 0.72
GFR calc Af Amer: >60
Total Protein: 7.7

## 2010-10-18 LAB — CBC
HCT: 44.5
Platelets: 364
WBC: 8.3

## 2010-11-04 ENCOUNTER — Encounter: Payer: Self-pay | Admitting: Gastroenterology

## 2010-11-30 ENCOUNTER — Encounter: Payer: Self-pay | Admitting: Gastroenterology

## 2010-11-30 ENCOUNTER — Ambulatory Visit (INDEPENDENT_AMBULATORY_CARE_PROVIDER_SITE_OTHER): Payer: Medicare Other | Admitting: Gastroenterology

## 2010-11-30 VITALS — BP 128/72 | HR 60 | Ht 64.0 in | Wt 172.0 lb

## 2010-11-30 DIAGNOSIS — Z86010 Personal history of colonic polyps: Secondary | ICD-10-CM

## 2010-11-30 NOTE — Patient Instructions (Signed)
We will contact Dr. Daleen Squibb about holding your plavix for 5 days prior to a colonosocopy. This procedure is NOT emergent and so it is very reasonable that we wait 6-12 months to perform the colonoscopy if that is safest for your heart.  We will  get back in touch with you after we hear from Dr. Daleen Squibb.

## 2010-11-30 NOTE — Progress Notes (Signed)
HPI: This is a  very pleasant 75 year old woman whom I last saw about 5 years ago. Since then she has had 2 acute MIs. She underwent stenting of coronary arteries this past summer. She has 4 stents currently. I believe at least one of them is a drug-eluting stent.  She has been on Plavix since then.  colonsocopy 2007 found 1 small TA, was recommended to have recall examination in 5 years.     Review of systems: Pertinent positive and negative review of systems were noted in the above HPI section. Complete review of systems was performed and was otherwise normal.    Past Medical History  Diagnosis Date  . Left bundle branch block   . Atrial fibrillation   . Acute myocardial infarction, unspecified site, episode of care unspecified   . Coronary atherosclerosis   . Pure hypercholesterolemia   . Unspecified essential hypertension   . Cough   . Other atopic dermatitis and related conditions   . Acute upper respiratory infections of unspecified site   . Allergic rhinitis due to pollen   . Unspecified asthma   . Other and unspecified ovarian cyst   . Personal history of malignant neoplasm of breast   . Personal history of hyperthyroidism   . Chronic eczema     hands  . Hx of coronary angioplasty     percutaneous transluminal  . Hemorrhoids     Past Surgical History  Procedure Date  . Oophorectomy   . Abdominal hysterectomy   . Ptca   . Cholecystectomy   . Coronary stent placement may and  july 2012  . Breast lumpectomy 08/24/2003    right lumpectomy+sln,T2N0,ERPR+,Her2-    Current Outpatient Prescriptions  Medication Sig Dispense Refill  . amLODipine (NORVASC) 5 MG tablet Take 1 tablet (5 mg total) by mouth daily.  30 tablet  1  . aspirin 81 MG tablet Take 325 mg by mouth daily.       Marland Kitchen atorvastatin (LIPITOR) 40 MG tablet Take 40 mg by mouth daily.        . Calcium-Vitamin D (CALTRATE 600 PLUS-VIT D PO) Take 2 tablets by mouth daily.        . carvedilol (COREG) 6.25 MG tablet  Take 1 tablet (6.25 mg total) by mouth 2 (two) times daily.  180 tablet  3  . clopidogrel (PLAVIX) 75 MG tablet Take 1 tablet (75 mg total) by mouth daily.  90 tablet  3  . furosemide (LASIX) 40 MG tablet Take 40 mg by mouth daily.        . Multiple Vitamin (MULTIVITAMIN) tablet Take 1 tablet by mouth daily.        . nitroGLYCERIN (NITROSTAT) 0.4 MG SL tablet Place 0.4 mg under the tongue every 5 (five) minutes as needed.        . potassium chloride SA (K-DUR,KLOR-CON) 20 MEQ tablet Take 1 tablet (20 mEq total) by mouth daily.  90 tablet  3  . trandolapril ([name]) 4 MG tablet Take 4 mg by mouth daily.          Allergies as of 11/30/2010 - Review Complete 11/30/2010  Allergen Reaction Noted  . Meperidine hcl    . Penicillins      Family History  Problem Relation Age of Onset  . Uterine cancer Mother   . Hypertension Mother   . Coronary artery disease Father   . Heart disease Sister   . Heart disease Brother   . Endometrial cancer Sister   .  Heart disease Brother   . Clotting disorder Brother     blood clot  . Colon cancer Neg Hx     History   Social History  . Marital Status: Married    Spouse Name: N/A    Number of Children: 2  . Years of Education: N/A   Occupational History  . Retired     Social History Main Topics  . Smoking status: Never Smoker   . Smokeless tobacco: Never Used  . Alcohol Use: No  . Drug Use: No  . Sexually Active: Not on file   Other Topics Concern  . Not on file   Social History Narrative   0 caffeine drinks daily        Physical Exam: BP 128/72  Pulse 60  Ht 5\' 4"  (1.626 m)  Wt 172 lb (78.019 kg)  BMI 29.52 kg/m2 Constitutional: generally well-appearing Psychiatric: alert and oriented x3 Eyes: extraocular movements intact Mouth: oral pharynx moist, no lesions Neck: supple no lymphadenopathy Cardiovascular: heart regular rate and rhythm Lungs: clear to auscultation bilaterally Abdomen: soft, nontender, nondistended, no  obvious ascites, no peritoneal signs, normal bowel sounds Extremities: no lower extremity edema bilaterally Skin: no lesions on visible extremities    Assessment and plan: 75 y.o. female with   personal history of tubular adenoma, current Plavix use  her stents are fairly new and so I suspect that it would not be safe for her to hold her Plavix for another several months. We will contact her cardiologist for more formal input. Biologically it is certainly safe for her to wait for her surveillance colonoscopy for another 6-12 months if that is best for her heart. We will get back in touch with her after we hear from her cardiologist.

## 2011-01-02 ENCOUNTER — Telehealth: Payer: Self-pay | Admitting: Cardiology

## 2011-01-02 ENCOUNTER — Other Ambulatory Visit: Payer: Self-pay | Admitting: Cardiology

## 2011-01-02 NOTE — Telephone Encounter (Signed)
New refill Pt wants lipitor, mavik, lasix called into [facility]-[phone]

## 2011-02-03 ENCOUNTER — Telehealth: Payer: Self-pay

## 2011-02-03 NOTE — Telephone Encounter (Signed)
Pt needs to have procedure scheduled

## 2011-02-03 NOTE — Telephone Encounter (Signed)
Message copied by Donata Duff on Fri Feb 03, 2011 10:52 AM ------      Message from: Waymon Budge      Created: Fri Feb 03, 2011 10:49 AM      Regarding: Plavix       Cabrini Ruggieri,            Per our conversation today, Dr. Daleen Squibb has agreed for Eduard Clos to stop Plavix 7 days prior to her endoscopy and restart immediately after the procedure.            Please let us know if you require additional information.            Cory (Dr. Vern Claude Nurse)

## 2011-02-07 NOTE — Telephone Encounter (Signed)
Left message with family member to call back regarding her procedure that needs to be scheduled

## 2011-02-07 NOTE — Telephone Encounter (Signed)
Pt has been scheduled for previsit and colon pre visit letter mailed

## 2011-02-27 ENCOUNTER — Other Ambulatory Visit: Payer: Self-pay | Admitting: Cardiology

## 2011-02-27 MED ORDER — TRANDOLAPRIL 4 MG PO TABS: 4.0000 mg | ORAL_TABLET | Freq: Every day | ORAL | Status: DC

## 2011-02-27 MED ORDER — AMLODIPINE BESYLATE 5 MG PO TABS: 5.0000 mg | ORAL_TABLET | Freq: Every day | ORAL | Status: DC

## 2011-02-27 MED ORDER — CLOPIDOGREL BISULFATE 75 MG PO TABS: 75.0000 mg | ORAL_TABLET | Freq: Every day | ORAL | Status: DC

## 2011-02-27 MED ORDER — FUROSEMIDE 40 MG PO TABS: 40.0000 mg | ORAL_TABLET | Freq: Every day | ORAL | Status: DC

## 2011-02-27 MED ORDER — POTASSIUM CHLORIDE CRYS ER 20 MEQ PO TBCR: 20.0000 meq | EXTENDED_RELEASE_TABLET | Freq: Every day | ORAL | Status: DC

## 2011-02-27 MED ORDER — ATORVASTATIN CALCIUM 40 MG PO TABS: 40.0000 mg | ORAL_TABLET | Freq: Every day | ORAL | Status: DC

## 2011-02-27 NOTE — Telephone Encounter (Signed)
New Refill   Patient refilled all RX, and updated preferred pharmacy to [facility].  She can be reached at hm# if you need additional info.

## 2011-03-07 ENCOUNTER — Ambulatory Visit (AMBULATORY_SURGERY_CENTER): Payer: Medicare Other

## 2011-03-07 ENCOUNTER — Encounter: Payer: Self-pay | Admitting: Gastroenterology

## 2011-03-07 VITALS — Ht 64.0 in | Wt 175.2 lb

## 2011-03-07 DIAGNOSIS — Z86010 Personal history of colonic polyps: Secondary | ICD-10-CM

## 2011-03-07 DIAGNOSIS — Z1211 Encounter for screening for malignant neoplasm of colon: Secondary | ICD-10-CM

## 2011-03-07 MED ORDER — PEG-KCL-NACL-NASULF-NA ASC-C 100 G PO SOLR: 1.0000 | Freq: Once | ORAL | Status: AC

## 2011-03-09 ENCOUNTER — Telehealth: Payer: Self-pay | Admitting: Gastroenterology

## 2011-03-09 NOTE — Telephone Encounter (Signed)
Pt states that the prep is too expensive; she absolutely cannot afford it.  Free Moviprep voucher mailed to pt.

## 2011-03-14 ENCOUNTER — Encounter: Payer: Self-pay | Admitting: Gastroenterology

## 2011-03-14 ENCOUNTER — Ambulatory Visit (AMBULATORY_SURGERY_CENTER): Payer: Medicare Other | Admitting: Gastroenterology

## 2011-03-14 DIAGNOSIS — D126 Benign neoplasm of colon, unspecified: Secondary | ICD-10-CM

## 2011-03-14 DIAGNOSIS — K644 Residual hemorrhoidal skin tags: Secondary | ICD-10-CM

## 2011-03-14 DIAGNOSIS — Z86010 Personal history of colonic polyps: Secondary | ICD-10-CM

## 2011-03-14 DIAGNOSIS — Z1211 Encounter for screening for malignant neoplasm of colon: Secondary | ICD-10-CM

## 2011-03-14 MED ORDER — CLOPIDOGREL BISULFATE 75 MG PO TABS: 75.0000 mg | ORAL_TABLET | Freq: Every day | ORAL | Status: DC

## 2011-03-14 MED ORDER — SODIUM CHLORIDE 0.9 % IV SOLN: 500.0000 mL | INTRAVENOUS | Status: DC

## 2011-03-14 NOTE — Op Note (Signed)
 [facility] [address] [place], [place]  [address]  COLONOSCOPY PROCEDURE REPORT  PATIENT:  Alicia Guerrero, Alicia Guerrero  MR#:  [id number] BIRTHDATE:  02-Jun-1935, 75 yrs. old  GENDER:  female ENDOSCOPIST:  Rachael Fee, MD PROCEDURE DATE:  03/14/2011 PROCEDURE:  Colonoscopy with snare polypectomy ASA CLASS:  Class II INDICATIONS:  colonsocopy 2007 found 1 small TA, was recommended to have recall examination in 5 years. MEDICATIONS:   Fentanyl 50 mcg IV, These medications were titrated to patient response per physician's verbal order, Versed 4 mg IV  DESCRIPTION OF PROCEDURE:   After the risks benefits and alternatives of the procedure were thoroughly explained, informed consent was obtained.  Digital rectal exam was performed and revealed no rectal masses.   The LB 180AL [id number] endoscope was introduced through the anus and advanced to the cecum, which was identified by both the appendix and ileocecal valve, without limitations.  The quality of the prep was good..  The instrument was then slowly withdrawn as the colon was fully examined. <<PROCEDUREIMAGES>> FINDINGS:  A sessile polyp was found in the ascending colon. This was 5mm across, removed with cold snare and sent to pathology (jar 1) (see image4).  External Hemorrhoids were found.  This was otherwise a normal examination of the colon (see image2, image3, and image5).   Retroflexed views in the rectum revealed no abnormalities. COMPLICATIONS:  None  ENDOSCOPIC IMPRESSION: 1) Sessile polyp in the ascending colon, this was removed and sent to pathology 2) External hemorrhoids 3) Otherwise normal examination  RECOMMENDATIONS: 1) Given your personal history of pre-cancerous polyps, you will need a repeat colonoscopy in 5 years even if the polyp removed today IS NOT precancerous.   You will receive a letter within 1-2 weeks with the results of your biopsy as well as final recommendations. Please call my office if you have  not received a letter after 3 weeks.  ______________________________ Rachael Fee, MD  cc: Wyonia Hough, MD  n. Rosalie Doctor:   Rachael Fee at 03/14/2011 09:24 AM  Eduard Clos, [phone]

## 2011-03-14 NOTE — Patient Instructions (Addendum)
Resume your prior medications today. Please call if any questions or concerns. Please resume your plavix today 03-14-11 per Dr. Christella Hartigan.  YOU HAD AN ENDOSCOPIC PROCEDURE TODAY AT THE [facility]: Refer to the procedure report that was given to you for any specific questions about what was found during the examination.  If the procedure report does not answer your questions, please call your gastroenterologist to clarify.  If you requested that your care partner not be given the details of your procedure findings, then the procedure report has been included in a sealed envelope for you to review at your convenience later.  YOU SHOULD EXPECT: Some feelings of bloating in the abdomen. Passage of more gas than usual.  Walking can help get rid of the air that was put into your GI tract during the procedure and reduce the bloating. If you had a lower endoscopy (such as a colonoscopy or flexible sigmoidoscopy) you may notice spotting of blood in your stool or on the toilet paper. If you underwent a bowel prep for your procedure, then you may not have a normal bowel movement for a few days.  DIET: Your first meal following the procedure should be a light meal and then it is ok to progress to your normal diet.  A half-sandwich or bowl of soup is an example of a good first meal.  Heavy or fried foods are harder to digest and may make you feel nauseous or bloated.  Likewise meals heavy in dairy and vegetables can cause extra gas to form and this can also increase the bloating.  Drink plenty of fluids but you should avoid alcoholic beverages for 24 hours.  ACTIVITY: Your care partner should take you home directly after the procedure.  You should plan to take it easy, moving slowly for the rest of the day.  You can resume normal activity the day after the procedure however you should NOT DRIVE or use heavy machinery for 24 hours (because of the sedation medicines used during the test).    SYMPTOMS TO REPORT  IMMEDIATELY: A gastroenterologist can be reached at any hour.  During normal business hours, 8:30 AM to 5:00 PM Monday through Friday, call [phone].  After hours and on weekends, please call the GI answering service at [phone] who will take a message and have the physician on call contact you.   Following lower endoscopy (colonoscopy or flexible sigmoidoscopy):  Excessive amounts of blood in the stool  Significant tenderness or worsening of abdominal pains  Swelling of the abdomen that is new, acute  Fever of 100F or higher    FOLLOW UP: If any biopsies were taken you will be contacted by phone or by letter within the next 1-3 weeks.  Call your gastroenterologist if you have not heard about the biopsies in 3 weeks.  Our staff will call the home number listed on your records the next business day following your procedure to check on you and address any questions or concerns that you may have at that time regarding the information given to you following your procedure. This is a courtesy call and so if there is no answer at the home number and we have not heard from you through the emergency physician on call, we will assume that you have returned to your regular daily activities without incident.  SIGNATURES/CONFIDENTIALITY: You and/or your care partner have signed paperwork which will be entered into your electronic medical record.  These signatures attest to the fact  that that the information above on your After Visit Summary has been reviewed and is understood.  Full responsibility of the confidentiality of this discharge information lies with you and/or your care-partner.

## 2011-03-14 NOTE — Progress Notes (Signed)
I assisted the pt with dressing and went with her to the restroom. Maw  No complaints noted in the recovery room. Maw  Patient did not experience any of the following events: a burn prior to discharge; a fall within the facility; wrong site/side/patient/procedure/implant event; or a hospital transfer or hospital admission upon discharge from the facility. [phone]) Patient did not have preoperative order for IV antibiotic SSI prophylaxis. [phone])

## 2011-03-15 ENCOUNTER — Telehealth: Payer: Self-pay | Admitting: *Deleted

## 2011-03-15 ENCOUNTER — Encounter: Payer: Self-pay | Admitting: Gastroenterology

## 2011-03-15 NOTE — Telephone Encounter (Signed)
  Follow up Call-  Call back number 03/14/2011  Post procedure Call Back phone  # [phone]  Permission to leave phone message Yes     Patient questions:  Do you have a fever, pain , or abdominal swelling? no Pain Score  0 *  Have you tolerated food without any problems? yes  Have you been able to return to your normal activities? yes  Do you have any questions about your discharge instructions: Diet   no Medications  no Follow up visit  no  Do you have questions or concerns about your Care? no  Actions: * If pain score is 4 or above: No action needed, pain <4.

## 2011-03-17 ENCOUNTER — Other Ambulatory Visit: Payer: Self-pay | Admitting: Internal Medicine

## 2011-03-17 MED ORDER — AZITHROMYCIN 250 MG PO TABS
ORAL_TABLET | ORAL | Status: AC
Start: 2011-03-17 — End: 2011-03-22

## 2011-03-23 ENCOUNTER — Encounter: Payer: Self-pay | Admitting: Gastroenterology

## 2011-05-17 ENCOUNTER — Telehealth: Payer: Self-pay | Admitting: Cardiology

## 2011-05-17 ENCOUNTER — Encounter: Payer: Self-pay | Admitting: Cardiology

## 2011-05-17 ENCOUNTER — Ambulatory Visit (INDEPENDENT_AMBULATORY_CARE_PROVIDER_SITE_OTHER): Payer: Medicare Other | Admitting: Cardiology

## 2011-05-17 VITALS — BP 126/78 | HR 50 | Ht 64.0 in | Wt 177.0 lb

## 2011-05-17 DIAGNOSIS — E782 Mixed hyperlipidemia: Secondary | ICD-10-CM

## 2011-05-17 DIAGNOSIS — I4891 Unspecified atrial fibrillation: Secondary | ICD-10-CM

## 2011-05-17 DIAGNOSIS — I2510 Atherosclerotic heart disease of native coronary artery without angina pectoris: Secondary | ICD-10-CM

## 2011-05-17 DIAGNOSIS — I447 Left bundle-branch block, unspecified: Secondary | ICD-10-CM

## 2011-05-17 DIAGNOSIS — Z8639 Personal history of other endocrine, nutritional and metabolic disease: Secondary | ICD-10-CM

## 2011-05-17 DIAGNOSIS — I6529 Occlusion and stenosis of unspecified carotid artery: Secondary | ICD-10-CM

## 2011-05-17 DIAGNOSIS — I10 Essential (primary) hypertension: Secondary | ICD-10-CM

## 2011-05-17 DIAGNOSIS — Z862 Personal history of diseases of the blood and blood-forming organs and certain disorders involving the immune mechanism: Secondary | ICD-10-CM

## 2011-05-17 MED ORDER — WARFARIN SODIUM 5 MG PO TABS: 5.0000 mg | ORAL_TABLET | Freq: Every day | ORAL | Status: DC

## 2011-05-17 MED ORDER — CARVEDILOL 6.25 MG PO TABS: ORAL_TABLET | ORAL | Status: DC

## 2011-05-17 NOTE — Assessment & Plan Note (Signed)
She is at significant risk of having a stroke if she goes back into afib. We'll discontinue her Plavix since she is one year out from her drug-eluting stent. We'll begin Coumadin.

## 2011-05-17 NOTE — Patient Instructions (Addendum)
Your physician has recommended you make the following change in your medication:  Stop Plavix    Decrease Carvedilol ( Coreg) to 1 tablet a day for 1 week. Then discontinue. Start Coumadin 5 mg tomorrow.  Your physician wants you to follow-up in: 6 months with Dr. Daleen Squibb. You will receive a reminder letter in the mail two months in advance. If you don't receive a letter, please call our office to schedule the follow-up appt.  You have been referred to Coumadin Clinic at Dr. Kathi Der office in [place] to monitor your Coumadin level.  You will need to have your Coumadin level checked on Monday May 22, 2011.    Your physician has requested that you have a carotid duplex. This test is an ultrasound of the carotid arteries in your neck. It looks at blood flow through these arteries that supply the brain with blood. Allow one hour for this exam. There are no restrictions or special instructions. September 2013

## 2011-05-17 NOTE — Assessment & Plan Note (Signed)
Carotid Dopplers in September.

## 2011-05-17 NOTE — Assessment & Plan Note (Signed)
She remains bradycardic on low dose carvedilol and  has increased risk of  complete heart block with a left bundle branch block pattern. We'll taper off over the next week.

## 2011-05-17 NOTE — Telephone Encounter (Signed)
New Problem:     Called in because they wanted her to be on both warfarin (COUMADIN) 5 MG tablet and Plavix or if we have discontinued her warfarin.  Please call back.

## 2011-05-17 NOTE — Telephone Encounter (Signed)
[facility]ax[facility] Hire is aware the plavix was discontinued. Start Coumadin tomorrow. Follow-up with Coumadin clinic in [place] on Monday. Mylo Red RN

## 2011-05-17 NOTE — Progress Notes (Signed)
HPI Alicia Guerrero comes in today for evaluation of management of her coronary artery disease, history one episode of paroxysmal A. fib, and previous history of a myocardial infarction. His hypertension, hyperlipidemia which is followed by primary care.  She is having no angina or ischemic symptoms. She denies any palpitation or presyncope. There is a history of bradycardia and I decreased her carvedilol office visit. She still running 50 in sinus rhythm. She is a left bundle branch block pattern.  She is to have carotid Dopplers in September.  Past Medical History  Diagnosis Date  . Left bundle branch block   . Atrial fibrillation   . Acute myocardial infarction, unspecified site, episode of care unspecified   . Coronary atherosclerosis   . Pure hypercholesterolemia   . Unspecified essential hypertension   . Cough   . Other atopic dermatitis and related conditions   . Acute upper respiratory infections of unspecified site   . Allergic rhinitis due to pollen   . Unspecified asthma   . Other and unspecified ovarian cyst   . Personal history of malignant neoplasm of breast   . Personal history of hyperthyroidism   . Chronic eczema     hands  . Hx of coronary angioplasty     percutaneous transluminal  . Hemorrhoids     Current Outpatient Prescriptions  Medication Sig Dispense Refill  . amLODipine (NORVASC) 5 MG tablet Take 1 tablet (5 mg total) by mouth daily.  30 tablet  1  . aspirin 81 MG tablet Take 81 mg by mouth daily.       Marland Kitchen atorvastatin (LIPITOR) 40 MG tablet Take 1 tablet (40 mg total) by mouth daily.  90 tablet  2  . Calcium-Vitamin D (CALTRATE 600 PLUS-VIT D PO) Take 1 tablet by mouth 2 (two) times daily.       . carvedilol (COREG) 6.25 MG tablet Take 1 tablet (6.25 mg total) by mouth 2 (two) times daily.  180 tablet  3  . clopidogrel (PLAVIX) 75 MG tablet Take 1 tablet (75 mg total) by mouth daily.  90 tablet  3  . fexofenadine (ALLEGRA) 180 MG tablet Take 180 mg by mouth  daily.      . fluocinonide cream (LIDEX) 0.05 % Apply topically 2 (two) times daily.      . furosemide (LASIX) 40 MG tablet Take 1 tablet (40 mg total) by mouth daily.  90 tablet  3  . Multiple Vitamin (MULTIVITAMIN) tablet Take 1 tablet by mouth daily.        . nitroGLYCERIN (NITROSTAT) 0.4 MG SL tablet Place 0.4 mg under the tongue every 5 (five) minutes as needed.        . potassium chloride SA (K-DUR,KLOR-CON) 20 MEQ tablet Take 1 tablet (20 mEq total) by mouth daily.  90 tablet  3  . trandolapril ([name]) 4 MG tablet Take 1 tablet (4 mg total) by mouth daily.  90 tablet  3    Allergies  Allergen Reactions  . Meperidine Hcl     Mixed with phenergan  . Penicillins   . Molds & Smuts Other (See Comments)    Runny nose  . Tape Other (See Comments)    Redness, pulls skin off    Family History  Problem Relation Age of Onset  . Uterine cancer Mother   . Hypertension Mother   . Coronary artery disease Father   . Heart disease Sister   . Heart disease Brother   . Endometrial cancer Sister   .  Heart disease Brother   . Clotting disorder Brother     blood clot  . Colon cancer Neg Hx     History   Social History  . Marital Status: Married    Spouse Name: N/A    Number of Children: 2  . Years of Education: N/A   Occupational History  . Retired     Social History Main Topics  . Smoking status: Never Smoker   . Smokeless tobacco: Never Used  . Alcohol Use: No  . Drug Use: No  . Sexually Active: Not on file   Other Topics Concern  . Not on file   Social History Narrative   0 caffeine drinks daily     ROS ALL NEGATIVE EXCEPT THOSE NOTED IN HPI  PE  General Appearance: well developed, well nourished in no acute distress, obese HEENT: symmetrical face, PERRLA, good dentition  Neck: no JVD, thyromegaly, or adenopathy, trachea midline Chest: symmetric without deformity Cardiac: PMI non-displaced, RRR, normal S1, S2, no gallop or murmur Lung: clear to ausculation and  percussion Vascular: all pulses full, soft right carotid bruit  Abdominal: nondistended, nontender, good bowel sounds, no HSM, no bruits Extremities: no cyanosis, clubbing or edema, no sign of DVT, no varicosities  Skin: normal color, no rashes Neuro: alert and oriented x 3, non-focal Pysch: normal affect  EKG Sinus bradycardia, ventricular conduction delay. BMET    Component Value Date/Time   NA 141 09/08/2010 1145   K 3.5 09/08/2010 1145   CL 103 09/08/2010 1145   CO2 32 09/08/2010 1145   GLUCOSE 100* 09/08/2010 1145   BUN 11 09/08/2010 1145   CREATININE 0.7 09/08/2010 1145   CALCIUM 10.0 09/08/2010 1145   GFRNONAA >60 08/17/2010 0550   GFRAA >60 08/17/2010 0550    Lipid Panel     Component Value Date/Time   CHOL 160 03/21/2010 0925   TRIG 223.0* 03/21/2010 0925   HDL 49.10 03/21/2010 0925   CHOLHDL 3 03/21/2010 0925   VLDL 44.6* 03/21/2010 0925   LDLCALC 52 07/14/2009 1009    CBC    Component Value Date/Time   WBC 9.1 08/17/2010 0550   WBC 7.5 07/28/2010 1322   RBC 4.63 08/17/2010 0550   RBC 4.65 07/28/2010 1322   HGB 12.4 08/17/2010 0550   HGB 12.6 07/28/2010 1322   HCT 37.6 08/17/2010 0550   HCT 37.6 07/28/2010 1322   PLT 289 08/17/2010 0550   PLT 294 07/28/2010 1322   MCV 81.2 08/17/2010 0550   MCV 80.8 07/28/2010 1322   MCH 26.8 08/17/2010 0550   MCH 27.0 07/28/2010 1322   MCHC 33.0 08/17/2010 0550   MCHC 33.5 07/28/2010 1322   RDW 14.5 08/17/2010 0550   RDW 15.1* 07/28/2010 1322   LYMPHSABS 2.0 08/14/2010 1639   LYMPHSABS 1.9 07/28/2010 1322   MONOABS 0.6 08/14/2010 1639   MONOABS 0.5 07/28/2010 1322   EOSABS 0.4 08/14/2010 1639   EOSABS 0.5 07/28/2010 1322   BASOSABS 0.1 08/14/2010 1639   BASOSABS 0.0 07/28/2010 1322

## 2011-06-14 ENCOUNTER — Telehealth: Payer: Self-pay | Admitting: Cardiology

## 2011-06-14 ENCOUNTER — Emergency Department (HOSPITAL_COMMUNITY)
Admission: EM | Admit: 2011-06-14 | Discharge: 2011-06-14 | Disposition: A | Discharge status: Home / Self Care | Payer: Medicare Other | Attending: Emergency Medicine | Admitting: Emergency Medicine

## 2011-06-14 DIAGNOSIS — Z9861 Coronary angioplasty status: Secondary | ICD-10-CM | POA: Insufficient documentation

## 2011-06-14 DIAGNOSIS — Z9889 Other specified postprocedural states: Secondary | ICD-10-CM | POA: Insufficient documentation

## 2011-06-14 DIAGNOSIS — I4891 Unspecified atrial fibrillation: Secondary | ICD-10-CM | POA: Insufficient documentation

## 2011-06-14 DIAGNOSIS — I252 Old myocardial infarction: Secondary | ICD-10-CM | POA: Insufficient documentation

## 2011-06-14 DIAGNOSIS — Z7901 Long term (current) use of anticoagulants: Secondary | ICD-10-CM | POA: Insufficient documentation

## 2011-06-14 DIAGNOSIS — I447 Left bundle-branch block, unspecified: Secondary | ICD-10-CM | POA: Insufficient documentation

## 2011-06-14 DIAGNOSIS — I10 Essential (primary) hypertension: Secondary | ICD-10-CM | POA: Insufficient documentation

## 2011-06-14 MED ORDER — AMLODIPINE BESYLATE 10 MG PO TABS: 10.0000 mg | ORAL_TABLET | Freq: Every day | ORAL | Status: DC

## 2011-06-14 NOTE — Telephone Encounter (Signed)
Pt will increase amlodipine to 10mg  daily starting today. Return on Friday of this week for a blood pressure check. She will call office regarding her blood pressure if needed before going to the ED as per Dr. Daleen Squibb. Mylo Red RN

## 2011-06-14 NOTE — ED Notes (Signed)
Patient is AOx4 and comfortable her discharge instructions.

## 2011-06-14 NOTE — Telephone Encounter (Signed)
Forward to Dr. Daleen Squibb and his nurse.

## 2011-06-14 NOTE — Telephone Encounter (Signed)
Deb, see note I sent to [facility]

## 2011-06-14 NOTE — Telephone Encounter (Signed)
New msg Pt wants to talk to you about her BP 212/98 yesterday. She said she went to er yesterday. She said her BP now 171/88 today. Please call

## 2011-06-14 NOTE — ED Notes (Addendum)
The patient states that she checked her blood pressure @ 0930 yesterday, and she noticed her BP was 162/87.  At 1430, she was gardening in her yard when she developed a bad headache.  She retook her BP at this time and noted a BP of 184/90.

## 2011-06-14 NOTE — ED Provider Notes (Addendum)
History     CSN: [id number]  Arrival date & time 06/14/11  0148   First MD Initiated Contact with Patient 06/14/11 0259      Chief Complaint  Patient presents with  . Hypertension    (Consider location/radiation/quality/duration/timing/severity/associated sxs/prior treatment) HPI Comments: 76 year old female with a history of hypertension, atrial fibrillation, left bundle branch block, MI, multiple stents presents with a history of one day of elevated blood pressure. She states that in the afternoon she was doing some yard work and developed a mild crampy headache on the top of her head which has since resolved spontaneously. She checked her blood pressure throughout the afternoon and noticed that he continued to go up from 160 systolic all the way up to 220 systolic. This happened just prior to arrival which prompted her visit. At this time she has no physical symptoms including no chest pain shortness of breath weakness numbness blurred vision abdominal pain dysuria diarrhea swelling or rashes. She states that her blood pressure has been coming down nicely. She has recently had changes in her medications stopping Coreg and starting other blood pressure medications. She had a Coumadin check at the Coumadin clinic this morning and her INR was 2.7.  Patient is a 76 y.o. female presenting with hypertension. The history is provided by the patient, the spouse and the EMS personnel.  Hypertension    Past Medical History  Diagnosis Date  . Left bundle branch block   . Atrial fibrillation   . Acute myocardial infarction, unspecified site, episode of care unspecified   . Coronary atherosclerosis   . Pure hypercholesterolemia   . Unspecified essential hypertension   . Cough   . Other atopic dermatitis and related conditions   . Acute upper respiratory infections of unspecified site   . Allergic rhinitis due to pollen   . Bronchospastic airway disease   . Other and unspecified ovarian cyst     . Personal history of malignant neoplasm of breast   . Personal history of hyperthyroidism   . Chronic eczema     hands  . Hx of coronary angioplasty     percutaneous transluminal  . Hemorrhoids     Past Surgical History  Procedure Date  . Oophorectomy   . Abdominal hysterectomy   . Ptca   . Cholecystectomy   . Coronary stent placement may and  july 2012  . Breast lumpectomy 08/24/2003    right lumpectomy+sln,T2N0,ERPR+,Her2-  . Polypectomy   . Colonoscopy     Family History  Problem Relation Age of Onset  . Uterine cancer Mother   . Hypertension Mother   . Coronary artery disease Father   . Heart disease Sister   . Heart disease Brother   . Endometrial cancer Sister   . Heart disease Brother   . Clotting disorder Brother     blood clot  . Colon cancer Neg Hx     History  Substance Use Topics  . Smoking status: Never Smoker   . Smokeless tobacco: Never Used  . Alcohol Use: No    OB History    Grav Para Term Preterm Abortions TAB SAB Ect Mult Living                  Review of Systems  All other systems reviewed and are negative.    Allergies  Meperidine hcl; Penicillins; Molds & smuts; and Tape  Home Medications   Current Outpatient Rx  Name Route Sig Dispense Refill  . AMLODIPINE BESYLATE  5 MG PO TABS Oral Take 1 tablet (5 mg total) by mouth daily. 30 tablet 1  . ASPIRIN EC 81 MG PO TBEC Oral Take 81 mg by mouth daily.    . ATORVASTATIN CALCIUM 40 MG PO TABS Oral Take 1 tablet (40 mg total) by mouth daily. 90 tablet 2  . CALTRATE 600 PLUS-VIT D PO Oral Take 1 tablet by mouth 2 (two) times daily.     Marland Kitchen FEXOFENADINE HCL 60 MG PO TABS Oral Take 60 mg by mouth daily as needed.    Marland Kitchen FLUOCINONIDE 0.05 % EX CREA Topical Apply 1 application topically 2 (two) times daily as needed. rash    . FUROSEMIDE 40 MG PO TABS Oral Take 1 tablet (40 mg total) by mouth daily. 90 tablet 3  . ONE-DAILY MULTI VITAMINS PO TABS Oral Take 1 tablet by mouth daily.      Marland Kitchen OVER  THE COUNTER MEDICATION Oral Take 1 tablet by mouth daily. Calcium, Magnesium, Zinc, with D3    . POTASSIUM CHLORIDE CRYS ER 20 MEQ PO TBCR Oral Take 1 tablet (20 mEq total) by mouth daily. 90 tablet 3  . TRANDOLAPRIL 4 MG PO TABS Oral Take 1 tablet (4 mg total) by mouth daily. 90 tablet 3  . WARFARIN SODIUM 5 MG PO TABS Oral Take 1 tablet (5 mg total) by mouth daily. 30 tablet 11  . NITROGLYCERIN 0.4 MG SL SUBL Sublingual Place 0.4 mg under the tongue every 5 (five) minutes as needed.        BP 156/69  Pulse 83  Temp(Src) 98.4 F (36.9 C) (Oral)  Resp 16  Ht 5\' 4"  (1.626 m)  Wt 178 lb (80.74 kg)  BMI 30.55 kg/m2  SpO2 95%  Physical Exam  Nursing note and vitals reviewed. Constitutional: She appears well-developed and well-nourished. No distress.  HENT:  Head: Normocephalic and atraumatic.  Mouth/Throat: Oropharynx is clear and moist. No oropharyngeal exudate.  Eyes: Conjunctivae and EOM are normal. Pupils are equal, round, and reactive to light. Right eye exhibits no discharge. Left eye exhibits no discharge. No scleral icterus.  Neck: Normal range of motion. Neck supple. No JVD present. No thyromegaly present.  Cardiovascular: Normal rate, regular rhythm, normal heart sounds and intact distal pulses.  Exam reveals no gallop and no friction rub.   No murmur heard. Pulmonary/Chest: Effort normal and breath sounds normal. No respiratory distress. She has no wheezes. She has no rales.  Abdominal: Soft. Bowel sounds are normal. She exhibits no distension and no mass. There is no tenderness.  Musculoskeletal: Normal range of motion. She exhibits no edema and no tenderness.  Lymphadenopathy:    She has no cervical adenopathy.  Neurological: She is alert. Coordination normal.  Skin: Skin is warm and dry. No rash noted. No erythema.  Psychiatric: She has a normal mood and affect. Her behavior is normal.    ED Course  Procedures (including critical care time)  ED ECG REPORT   Date:  06/14/2011   Rate: 66  Rhythm: normal sinus rhythm  QRS Axis: normal  Intervals: normal  ST/T Wave abnormalities: nonspecific ST/T changes  Conduction Disutrbances:Incomplete left bundle branch block  Narrative Interpretation: PVCs present  Old EKG Reviewed: Since an EKG from 08/17/2010, QRS duration is shortened, no other acute changes   Labs Reviewed - No data to display No results found.   1. Hypertension       MDM  No murmur, no peripheral edema, well-appearing with a blood pressure of  162/69 at this time. Check EKG, otherwise patient is benign and well appearing and anticipate discharge.   EKG without acute findings, as essentially unchanged from prior EKG - patient asymptomatic, blood pressure reasonable for discharge, exam reassuring, patient instructed to followup for blood pressure check within one week.  Vida Roller, MD 06/14/11 1610  Vida Roller, MD 06/14/11 [phone]

## 2011-06-14 NOTE — Telephone Encounter (Signed)
We tapered her carvedilol off last visit because of bradycardia and a left bundle branch block. We need to increase her amlodipine from 5 mg to 10 mg per day. She needs a blood pressure check with [place] on Friday. Tell her not to go the emergency room again if her pressures elevated before she calls [place].

## 2011-06-14 NOTE — Discharge Instructions (Signed)
Please only take your blood pressure twice a day, the more often that you take it the higher it will go. Your EKG did not show any significant findings, return to the hospital immediately for severe or worsening symptoms including headache, vomiting, chest pain, shortness of breath, weakness or passing out spells. Call your Dr. in the morning to set up an appointment for a blood pressure check this week.

## 2011-06-16 ENCOUNTER — Telehealth: Payer: Self-pay | Admitting: Cardiology

## 2011-06-16 NOTE — Telephone Encounter (Signed)
Noted  

## 2011-06-16 NOTE — Telephone Encounter (Signed)
New Problem:    Patient called in to report her BP131/65 and Pulse 60.  Please call back if you have any questions or instructions for her.

## 2011-07-12 ENCOUNTER — Telehealth: Payer: Self-pay | Admitting: Oncology

## 2011-07-12 NOTE — Telephone Encounter (Signed)
S/w pt re appts for 7/19 and 7/23. Also confirmed w/[facility] that pt is on schedule for mammo/bone density 9/30 @ 10 am. Per Cordelia Pen at [facility] pt is due in August but at pt's request appts were scheduled for 9/30. Also confirmed mammo/bone density w/pt. S/w Jan re above and per Jan (PR) leave lb/fu as scheduled for July.

## 2011-07-14 ENCOUNTER — Telehealth: Payer: Self-pay | Admitting: Cardiology

## 2011-07-14 NOTE — Telephone Encounter (Signed)
New msg [facility] wants to verify should her coumadin be checked there. Pt doesn't want to pay copay

## 2011-07-14 NOTE — Telephone Encounter (Signed)
Spoke with patient and nurse.  Pt is aware that there would be a copay no matter if she came to our office versus [facility].  I did let both parties know we will have a Coumadin clinic open at Dr. Debby Bud office in September so if pt would like to switch at that time, she may.

## 2011-08-04 ENCOUNTER — Other Ambulatory Visit (HOSPITAL_BASED_OUTPATIENT_CLINIC_OR_DEPARTMENT_OTHER): Payer: Medicare Other | Admitting: Lab

## 2011-08-04 DIAGNOSIS — Z853 Personal history of malignant neoplasm of breast: Secondary | ICD-10-CM

## 2011-08-04 LAB — CBC WITH DIFFERENTIAL/PLATELET
Basophils Absolute: 0.1 10*3/uL (ref 0.0–0.1)
EOS%: 3.8 % (ref 0.0–7.0)
MCV: 81.8 fL (ref 79.5–101.0)
MONO#: 0.5 10*3/uL (ref 0.1–0.9)
Platelets: 286 10*3/uL (ref 145–400)
RDW: 14.2 % (ref 11.2–14.5)
lymph#: 1.7 10*3/uL (ref 0.9–3.3)

## 2011-08-05 LAB — COMPREHENSIVE METABOLIC PANEL
ALT: 16 U/L (ref 0–35)
AST: 23 U/L (ref 0–37)
Albumin: 4.5 g/dL (ref 3.5–5.2)
BUN: 13 mg/dL (ref 6–23)
CO2: 27 mEq/L (ref 19–32)
Calcium: 9.8 mg/dL (ref 8.4–10.5)
Chloride: 105 mEq/L (ref 96–112)
Potassium: 4 mEq/L (ref 3.5–5.3)

## 2011-08-05 LAB — VITAMIN D 25 HYDROXY (VIT D DEFICIENCY, FRACTURES): Vit D, 25-Hydroxy: 51 ng/mL (ref 30–89)

## 2011-08-08 ENCOUNTER — Ambulatory Visit (HOSPITAL_BASED_OUTPATIENT_CLINIC_OR_DEPARTMENT_OTHER): Payer: Medicare Other | Admitting: Oncology

## 2011-08-08 VITALS — BP 150/74 | HR 63 | Temp 98.5°F | Ht 64.0 in | Wt 174.9 lb

## 2011-08-08 DIAGNOSIS — C50919 Malignant neoplasm of unspecified site of unspecified female breast: Secondary | ICD-10-CM

## 2011-08-08 NOTE — Progress Notes (Signed)
Hematology and Oncology Follow Up Visit  Alicia Guerrero [id number] 1935-09-22 76 y.o. 08/08/2011 4:09 PM   DIAGNOSIS:  History of breast cancer    PAST THERAPY:   PROBLEM:  History of T2, N0 breast cancer diagnosed in 2005 status post lumpectomy with sentinel lymph node dissection followed by radiation therapy, on MA-27 study randomized to anastrozole arm completed five years of therapy in December 2010.  Continues on Arimidex.  Interim History: She is been doing well. She is currently taking Coumadin as she has had episodes of atrial fibrillation. Plavix has been stopped. She is due for for a mammogram in July  Medications: I have reviewed the patient's current medications.  Allergies:  Allergies  Allergen Reactions  . Meperidine Hcl Other (See Comments)    Mixed with phenergan swelling of the mouth   . Penicillins   . Molds & Smuts Other (See Comments)    Runny nose  . Tape Other (See Comments)    Redness, pulls skin off    Past Medical History, Surgical history, Social history, and Family History were reviewed and updated.  Review of Systems: Constitutional:  Negative for fever, chills, night sweats, anorexia, weight loss, pain. Cardiovascular: no chest pain or dyspnea on exertion Respiratory: no cough, shortness of breath, or wheezing Neurological: no TIA or stroke symptoms Dermatological: negative ENT: negative Skin Gastrointestinal: negative Genito-Urinary: negative Hematological and Lymphatic: negative Breast: negative Musculoskeletal: negative Remaining ROS negative.  Physical Exam:  Blood pressure 150/74, pulse 63, temperature 98.5 F (36.9 C), temperature source Oral, height 5\' 4"  (1.626 m), weight 174 lb 14.4 oz (79.334 kg).  ECOG: 0  HEENT:  Sclerae anicteric, conjunctivae pink.  Oropharynx clear.  No mucositis or candidiasis.  Nodes:  No cervical, supraclavicular, or axillary lymphadenopathy palpated.  Breast Exam:  Right breast is benign. There is a  well-healed scar. There is tells ectasia around the scar secondary to radiation . No masses, discharge, skin change, or nipple inversion.  Left breast is benign.  No masses, discharge, skin change, or nipple inversion..  Lungs:  Clear to auscultation bilaterally.  No crackles, rhonchi, or wheezes.  Heart:  Regular rate and rhythm.  Abdomen:  Soft, nontender.  Positive bowel sounds.  No organomegaly or masses palpated.  Musculoskeletal:  No focal spinal tenderness to palpation.  Extremities:  Benign.  No peripheral edema or cyanosis.  Skin:  Benign.  Neuro:  Nonfocal.     Lab Results: Lab Results  Component Value Date   WBC 7.0 08/04/2011   HGB 12.4 08/04/2011   HCT 38.1 08/04/2011   MCV 81.8 08/04/2011   PLT 286 08/04/2011     Chemistry      Component Value Date/Time   NA 139 08/04/2011 1353   K 4.0 08/04/2011 1353   CL 105 08/04/2011 1353   CO2 27 08/04/2011 1353   BUN 13 08/04/2011 1353   CREATININE 0.74 08/04/2011 1353      Component Value Date/Time   CALCIUM 9.8 08/04/2011 1353   ALKPHOS 86 08/04/2011 1353   AST 23 08/04/2011 1353   ALT 16 08/04/2011 1353   BILITOT 0.5 08/04/2011 1353       Radiological Studies:  No results found.   IMPRESSIONS AND PLAN: A 76 y.o. female with   History of breast cancer 2005 now on regular annual followup. I will see her in a years time. I have scheduled her mammogram. I reviewed her lab work. All looked appears to be within normal limits.  Spent more  than half the time coordinating care, as well as discussion of BMI and its implications.      Teckla Christiansen 7/23/20134:09 PM Cell [phone]

## 2011-08-09 ENCOUNTER — Telehealth: Payer: Self-pay | Admitting: *Deleted

## 2011-08-09 NOTE — Telephone Encounter (Signed)
mailed out calendar to the patient to inform the patient of the new date and time in 2014

## 2011-08-14 ENCOUNTER — Ambulatory Visit (INDEPENDENT_AMBULATORY_CARE_PROVIDER_SITE_OTHER): Payer: Medicare Other | Admitting: General Surgery

## 2011-08-22 ENCOUNTER — Ambulatory Visit (INDEPENDENT_AMBULATORY_CARE_PROVIDER_SITE_OTHER): Payer: Medicare Other | Admitting: General Surgery

## 2011-09-04 ENCOUNTER — Telehealth (INDEPENDENT_AMBULATORY_CARE_PROVIDER_SITE_OTHER): Payer: Self-pay | Admitting: General Surgery

## 2011-09-04 NOTE — Telephone Encounter (Signed)
Spoke with pt and informed her we needed to move her appt from 8/29 to 8/28 at 2:10.  She was fine with this.

## 2011-09-13 ENCOUNTER — Ambulatory Visit (INDEPENDENT_AMBULATORY_CARE_PROVIDER_SITE_OTHER): Payer: Medicare Other | Admitting: General Surgery

## 2011-09-13 ENCOUNTER — Encounter (INDEPENDENT_AMBULATORY_CARE_PROVIDER_SITE_OTHER): Payer: Self-pay | Admitting: General Surgery

## 2011-09-13 VITALS — BP 136/74 | HR 66 | Ht 64.0 in | Wt 175.8 lb

## 2011-09-13 DIAGNOSIS — Z853 Personal history of malignant neoplasm of breast: Secondary | ICD-10-CM

## 2011-09-13 NOTE — Progress Notes (Signed)
Subjective:     Patient ID: Alicia Guerrero, female   DOB: Apr 17, 1935, 76 y.o.   MRN: [id number]  HPI The patient is a 76 year old white female who is 8 years out from a right lumpectomy for a T2 N0 right breast cancer. She has had no major medical problems since her last visit. She denies any breast pain. She denies any discharge from her nipple. Her next scheduled mammogram is coming up soon. Otherwise she has no complaints.  Review of Systems  Constitutional: Negative.   HENT: Negative.   Eyes: Negative.   Respiratory: Negative.   Cardiovascular: Negative.   Gastrointestinal: Negative.   Genitourinary: Negative.   Musculoskeletal: Negative.   Skin: Negative.   Neurological: Negative.   Hematological: Negative.   Psychiatric/Behavioral: Negative.        Objective:   Physical Exam  Constitutional: She is oriented to person, place, and time. She appears well-developed and well-nourished.  HENT:  Head: Normocephalic and atraumatic.  Eyes: Conjunctivae and EOM are normal. Pupils are equal, round, and reactive to light.  Neck: Normal range of motion. Neck supple.  Cardiovascular: Normal rate, regular rhythm and normal heart sounds.   Pulmonary/Chest: Effort normal and breath sounds normal.       She has some palpable scar in the upper outer quadrant of the right breast with some retraction and telangiectasia of the skin. This is stable.  Abdominal: Soft. Bowel sounds are normal. She exhibits no mass. There is no tenderness.  Musculoskeletal: Normal range of motion.  Lymphadenopathy:    She has no cervical adenopathy.  Neurological: She is alert and oriented to person, place, and time.  Skin: Skin is warm and dry.  Psychiatric: She has a normal mood and affect. Her behavior is normal.       Assessment:     8 year status post right lumpectomy    Plan:     At this point she will continue to do regular self exams. She will have her screening mammogram in the next couple weeks. If  this is negative we will plan to see her back in another year

## 2011-09-13 NOTE — Patient Instructions (Signed)
Continue regular self exams  

## 2011-09-14 ENCOUNTER — Ambulatory Visit (INDEPENDENT_AMBULATORY_CARE_PROVIDER_SITE_OTHER): Payer: Medicare Other | Admitting: General Surgery

## 2011-09-17 DIAGNOSIS — I469 Cardiac arrest, cause unspecified: Secondary | ICD-10-CM

## 2011-09-17 HISTORY — DX: Cardiac arrest, cause unspecified: I46.9

## 2011-09-26 ENCOUNTER — Inpatient Hospital Stay (HOSPITAL_COMMUNITY)
Admission: EM | Admit: 2011-09-26 | Discharge: 2011-10-12 | DRG: 004 | Disposition: A | Discharge status: Skilled Nursing Facility | Payer: Medicare Other | Attending: Pulmonary Disease | Admitting: Pulmonary Disease

## 2011-09-26 ENCOUNTER — Inpatient Hospital Stay (HOSPITAL_COMMUNITY): Payer: Medicare Other

## 2011-09-26 ENCOUNTER — Emergency Department (HOSPITAL_COMMUNITY): Payer: Medicare Other

## 2011-09-26 ENCOUNTER — Encounter (HOSPITAL_COMMUNITY): Payer: Self-pay | Admitting: *Deleted

## 2011-09-26 DIAGNOSIS — R4182 Altered mental status, unspecified: Secondary | ICD-10-CM | POA: Diagnosis present

## 2011-09-26 DIAGNOSIS — I469 Cardiac arrest, cause unspecified: Secondary | ICD-10-CM

## 2011-09-26 DIAGNOSIS — D6489 Other specified anemias: Secondary | ICD-10-CM | POA: Diagnosis present

## 2011-09-26 DIAGNOSIS — N390 Urinary tract infection, site not specified: Secondary | ICD-10-CM | POA: Diagnosis not present

## 2011-09-26 DIAGNOSIS — G9349 Other encephalopathy: Secondary | ICD-10-CM | POA: Diagnosis present

## 2011-09-26 DIAGNOSIS — A498 Other bacterial infections of unspecified site: Secondary | ICD-10-CM | POA: Diagnosis not present

## 2011-09-26 DIAGNOSIS — I472 Ventricular tachycardia, unspecified: Secondary | ICD-10-CM | POA: Diagnosis present

## 2011-09-26 DIAGNOSIS — I214 Non-ST elevation (NSTEMI) myocardial infarction: Secondary | ICD-10-CM | POA: Diagnosis present

## 2011-09-26 DIAGNOSIS — Z8249 Family history of ischemic heart disease and other diseases of the circulatory system: Secondary | ICD-10-CM

## 2011-09-26 DIAGNOSIS — J9600 Acute respiratory failure, unspecified whether with hypoxia or hypercapnia: Secondary | ICD-10-CM | POA: Diagnosis present

## 2011-09-26 DIAGNOSIS — L259 Unspecified contact dermatitis, unspecified cause: Secondary | ICD-10-CM | POA: Diagnosis present

## 2011-09-26 DIAGNOSIS — I10 Essential (primary) hypertension: Secondary | ICD-10-CM

## 2011-09-26 DIAGNOSIS — R7309 Other abnormal glucose: Secondary | ICD-10-CM | POA: Diagnosis not present

## 2011-09-26 DIAGNOSIS — Z880 Allergy status to penicillin: Secondary | ICD-10-CM

## 2011-09-26 DIAGNOSIS — R0902 Hypoxemia: Secondary | ICD-10-CM | POA: Diagnosis present

## 2011-09-26 DIAGNOSIS — J45909 Unspecified asthma, uncomplicated: Secondary | ICD-10-CM

## 2011-09-26 DIAGNOSIS — I4901 Ventricular fibrillation: Principal | ICD-10-CM | POA: Diagnosis present

## 2011-09-26 DIAGNOSIS — E876 Hypokalemia: Secondary | ICD-10-CM | POA: Diagnosis present

## 2011-09-26 DIAGNOSIS — F411 Generalized anxiety disorder: Secondary | ICD-10-CM | POA: Diagnosis not present

## 2011-09-26 DIAGNOSIS — R579 Shock, unspecified: Secondary | ICD-10-CM

## 2011-09-26 DIAGNOSIS — J069 Acute upper respiratory infection, unspecified: Secondary | ICD-10-CM

## 2011-09-26 DIAGNOSIS — I4891 Unspecified atrial fibrillation: Secondary | ICD-10-CM

## 2011-09-26 DIAGNOSIS — I6529 Occlusion and stenosis of unspecified carotid artery: Secondary | ICD-10-CM

## 2011-09-26 DIAGNOSIS — Z794 Long term (current) use of insulin: Secondary | ICD-10-CM

## 2011-09-26 DIAGNOSIS — I509 Heart failure, unspecified: Secondary | ICD-10-CM | POA: Diagnosis present

## 2011-09-26 DIAGNOSIS — R570 Cardiogenic shock: Secondary | ICD-10-CM | POA: Diagnosis present

## 2011-09-26 DIAGNOSIS — I4729 Other ventricular tachycardia: Secondary | ICD-10-CM | POA: Diagnosis present

## 2011-09-26 DIAGNOSIS — E2749 Other adrenocortical insufficiency: Secondary | ICD-10-CM | POA: Diagnosis not present

## 2011-09-26 DIAGNOSIS — Z6827 Body mass index (BMI) 27.0-27.9, adult: Secondary | ICD-10-CM

## 2011-09-26 DIAGNOSIS — Z7982 Long term (current) use of aspirin: Secondary | ICD-10-CM

## 2011-09-26 DIAGNOSIS — I452 Bifascicular block: Secondary | ICD-10-CM | POA: Diagnosis present

## 2011-09-26 DIAGNOSIS — E785 Hyperlipidemia, unspecified: Secondary | ICD-10-CM | POA: Diagnosis present

## 2011-09-26 DIAGNOSIS — I447 Left bundle-branch block, unspecified: Secondary | ICD-10-CM

## 2011-09-26 DIAGNOSIS — I5021 Acute systolic (congestive) heart failure: Secondary | ICD-10-CM | POA: Diagnosis not present

## 2011-09-26 DIAGNOSIS — I480 Paroxysmal atrial fibrillation: Secondary | ICD-10-CM

## 2011-09-26 DIAGNOSIS — Z853 Personal history of malignant neoplasm of breast: Secondary | ICD-10-CM

## 2011-09-26 DIAGNOSIS — E782 Mixed hyperlipidemia: Secondary | ICD-10-CM

## 2011-09-26 DIAGNOSIS — R001 Bradycardia, unspecified: Secondary | ICD-10-CM

## 2011-09-26 DIAGNOSIS — T45515A Adverse effect of anticoagulants, initial encounter: Secondary | ICD-10-CM | POA: Diagnosis not present

## 2011-09-26 DIAGNOSIS — E872 Acidosis, unspecified: Secondary | ICD-10-CM | POA: Diagnosis not present

## 2011-09-26 DIAGNOSIS — E669 Obesity, unspecified: Secondary | ICD-10-CM | POA: Diagnosis present

## 2011-09-26 DIAGNOSIS — E870 Hyperosmolality and hypernatremia: Secondary | ICD-10-CM | POA: Diagnosis not present

## 2011-09-26 DIAGNOSIS — J9690 Respiratory failure, unspecified, unspecified whether with hypoxia or hypercapnia: Secondary | ICD-10-CM

## 2011-09-26 DIAGNOSIS — R791 Abnormal coagulation profile: Secondary | ICD-10-CM | POA: Diagnosis not present

## 2011-09-26 DIAGNOSIS — R1310 Dysphagia, unspecified: Secondary | ICD-10-CM | POA: Diagnosis present

## 2011-09-26 DIAGNOSIS — I498 Other specified cardiac arrhythmias: Secondary | ICD-10-CM | POA: Diagnosis not present

## 2011-09-26 DIAGNOSIS — Z7901 Long term (current) use of anticoagulants: Secondary | ICD-10-CM

## 2011-09-26 DIAGNOSIS — Z5181 Encounter for therapeutic drug level monitoring: Secondary | ICD-10-CM

## 2011-09-26 DIAGNOSIS — I517 Cardiomegaly: Secondary | ICD-10-CM

## 2011-09-26 DIAGNOSIS — Z862 Personal history of diseases of the blood and blood-forming organs and certain disorders involving the immune mechanism: Secondary | ICD-10-CM

## 2011-09-26 DIAGNOSIS — I219 Acute myocardial infarction, unspecified: Secondary | ICD-10-CM

## 2011-09-26 DIAGNOSIS — J155 Pneumonia due to Escherichia coli: Secondary | ICD-10-CM | POA: Diagnosis not present

## 2011-09-26 DIAGNOSIS — Z79899 Other long term (current) drug therapy: Secondary | ICD-10-CM

## 2011-09-26 DIAGNOSIS — Z9861 Coronary angioplasty status: Secondary | ICD-10-CM

## 2011-09-26 DIAGNOSIS — R5381 Other malaise: Secondary | ICD-10-CM | POA: Diagnosis not present

## 2011-09-26 DIAGNOSIS — E7800 Pure hypercholesterolemia, unspecified: Secondary | ICD-10-CM | POA: Diagnosis present

## 2011-09-26 DIAGNOSIS — I2510 Atherosclerotic heart disease of native coronary artery without angina pectoris: Secondary | ICD-10-CM | POA: Diagnosis present

## 2011-09-26 DIAGNOSIS — K7200 Acute and subacute hepatic failure without coma: Secondary | ICD-10-CM | POA: Diagnosis present

## 2011-09-26 LAB — CBC WITH DIFFERENTIAL/PLATELET
Band Neutrophils: 0 % (ref 0–10)
Blasts: 0 %
Eosinophils Absolute: 0 10*3/uL (ref 0.0–0.7)
Eosinophils Relative: 0 % (ref 0–5)
HCT: 43 % (ref 36.0–46.0)
MCH: 26.6 pg (ref 26.0–34.0)
MCV: 81.6 fL (ref 78.0–100.0)
Metamyelocytes Relative: 0 %
Myelocytes: 0 %
Platelets: 277 10*3/uL (ref 150–400)
RDW: 14.1 % (ref 11.5–15.5)
nRBC: 0 /100 WBC

## 2011-09-26 LAB — POCT I-STAT 3, ART BLOOD GAS (G3+)
Acid-base deficit: 2 mmol/L (ref 0.0–2.0)
Bicarbonate: 24.9 mEq/L — ABNORMAL HIGH (ref 20.0–24.0)
Patient temperature: 95.5
pH, Arterial: 7.318 — ABNORMAL LOW (ref 7.350–7.450)

## 2011-09-26 LAB — BASIC METABOLIC PANEL
BUN: 15 mg/dL (ref 6–23)
BUN: 18 mg/dL (ref 6–23)
CO2: 23 mEq/L (ref 19–32)
CO2: 23 mEq/L (ref 19–32)
Calcium: 11.7 mg/dL — ABNORMAL HIGH (ref 8.4–10.5)
Chloride: 97 mEq/L (ref 96–112)
Chloride: 104 mEq/L (ref 96–112)
Creatinine, Ser: 0.97 mg/dL (ref 0.50–1.10)
Creatinine, Ser: 0.85 mg/dL (ref 0.50–1.10)
GFR calc Af Amer: 65 mL/min — ABNORMAL LOW (ref >90)
GFR calc non Af Amer: 56 mL/min — ABNORMAL LOW (ref >90)
Glucose, Bld: 415 mg/dL — ABNORMAL HIGH (ref 70–99)
Glucose, Bld: 376 mg/dL — ABNORMAL HIGH (ref 70–99)
Potassium: 2.1 mEq/L — CL (ref 3.5–5.1)
Potassium: 2.5 mEq/L — CL (ref 3.5–5.1)
Sodium: 142 mEq/L (ref 135–145)

## 2011-09-26 LAB — BLOOD GAS, ARTERIAL
Acid-base deficit: 6.3 mmol/L — ABNORMAL HIGH (ref 0.0–2.0)
Bicarbonate: 21 mEq/L (ref 20.0–24.0)
Drawn by: 129711
FIO2: 100 %
O2 Saturation: 98.6 %
Patient temperature: 37
TCO2: 20 mmol/L (ref 0–100)
pCO2 arterial: 60 mmHg (ref 35.0–45.0)
pH, Arterial: 7.17 — CL (ref 7.350–7.450)
pO2, Arterial: 140 mmHg — ABNORMAL HIGH (ref 80.0–100.0)

## 2011-09-26 LAB — CBC
HCT: 39.7 % (ref 36.0–46.0)
Hemoglobin: 12.7 g/dL (ref 12.0–15.0)
MCH: 26.9 pg (ref 26.0–34.0)
MCHC: 32 g/dL (ref 30.0–36.0)
MCV: 84.1 fL (ref 78.0–100.0)
Platelets: 204 10*3/uL (ref 150–400)
RBC: 4.72 MIL/uL (ref 3.87–5.11)
RDW: 14.1 % (ref 11.5–15.5)
WBC: 9.3 10*3/uL (ref 4.0–10.5)

## 2011-09-26 LAB — COMPREHENSIVE METABOLIC PANEL
ALT: 281 U/L — ABNORMAL HIGH (ref 0–35)
AST: 293 U/L — ABNORMAL HIGH (ref 0–37)
Alkaline Phosphatase: 111 U/L (ref 39–117)
Calcium: 9.6 mg/dL (ref 8.4–10.5)
GFR calc Af Amer: 75 mL/min — ABNORMAL LOW (ref >90)
Glucose, Bld: 360 mg/dL — ABNORMAL HIGH (ref 70–99)
Potassium: 2.5 mEq/L — CL (ref 3.5–5.1)
Sodium: 144 mEq/L (ref 135–145)
Total Protein: 6.9 g/dL (ref 6.0–8.3)

## 2011-09-26 LAB — PROTIME-INR
INR: 2.71 — ABNORMAL HIGH (ref 0.00–1.49)
Prothrombin Time: 29.2 seconds — ABNORMAL HIGH (ref 11.6–15.2)

## 2011-09-26 LAB — PHOSPHORUS
Phosphorus: 7.2 mg/dL — ABNORMAL HIGH (ref 2.3–4.6)
Phosphorus: 3.8 mg/dL (ref 2.3–4.6)

## 2011-09-26 LAB — PRO B NATRIURETIC PEPTIDE: Pro B Natriuretic peptide (BNP): 116 pg/mL (ref 0–125)

## 2011-09-26 LAB — MAGNESIUM: Magnesium: 2.6 mg/dL — ABNORMAL HIGH (ref 1.5–2.5)

## 2011-09-26 LAB — TROPONIN I
Troponin I: <0.3 ng/mL (ref <0.30)
Troponin I: 2.19 ng/mL (ref <0.30)

## 2011-09-26 LAB — APTT: aPTT: 39 seconds — ABNORMAL HIGH (ref 24–37)

## 2011-09-26 MED ORDER — PROPOFOL 10 MG/ML IV EMUL: 5.0000 ug/kg/min | Freq: Once | INTRAVENOUS | Status: AC

## 2011-09-26 MED ORDER — CISATRACURIUM BOLUS VIA INFUSION
0.0500 mg/kg | Freq: Once | INTRAVENOUS | Status: AC | PRN
  Filled 2011-09-26: qty 5

## 2011-09-26 MED ORDER — SODIUM CHLORIDE 0.9 % IV SOLN
1.0000 mg/h | INTRAVENOUS | Status: DC
  Administered 2011-09-26: 3 mg/h via INTRAVENOUS
  Administered 2011-09-27: 2 mg/h via INTRAVENOUS
  Administered 2011-09-28: 1 mg/h via INTRAVENOUS
  Filled 2011-09-26 (×3): qty 10

## 2011-09-26 MED ORDER — ASPIRIN 300 MG RE SUPP
300.0000 mg | RECTAL | Status: AC
  Administered 2011-09-26: 300 mg via RECTAL
  Filled 2011-09-26: qty 1

## 2011-09-26 MED ORDER — SODIUM CHLORIDE 0.9 % IV SOLN
50.0000 ug/h | INTRAVENOUS | Status: DC
  Administered 2011-09-26: 100 ug/h via INTRAVENOUS
  Filled 2011-09-26 (×2): qty 50

## 2011-09-26 MED ORDER — POTASSIUM CHLORIDE 20 MEQ/15ML (10%) PO LIQD
40.0000 meq | Freq: Once | ORAL | Status: AC
  Administered 2011-09-26: 40 meq
  Filled 2011-09-26: qty 30

## 2011-09-26 MED ORDER — PANTOPRAZOLE SODIUM 40 MG IV SOLR
40.0000 mg | INTRAVENOUS | Status: DC
  Administered 2011-09-26 – 2011-10-07 (×12): 40 mg via INTRAVENOUS
  Filled 2011-09-26 (×13): qty 40

## 2011-09-26 MED ORDER — FENTANYL CITRATE 0.05 MG/ML IJ SOLN
100.0000 ug | Freq: Once | INTRAMUSCULAR | Status: AC
  Administered 2011-09-26: 100 ug via INTRAVENOUS

## 2011-09-26 MED ORDER — BIOTENE DRY MOUTH MT LIQD
15.0000 mL | OROMUCOSAL | Status: DC
  Administered 2011-09-26 – 2011-10-10 (×79): 15 mL via OROMUCOSAL

## 2011-09-26 MED ORDER — POTASSIUM CHLORIDE 10 MEQ/50ML IV SOLN
10.0000 meq | INTRAVENOUS | Status: AC
  Administered 2011-09-27 (×6): 10 meq via INTRAVENOUS
  Filled 2011-09-26 (×2): qty 150

## 2011-09-26 MED ORDER — LORAZEPAM 2 MG/ML IJ SOLN
INTRAMUSCULAR | Status: AC
  Filled 2011-09-26: qty 1

## 2011-09-26 MED ORDER — SODIUM CHLORIDE 0.9 % IV SOLN
1.0000 ug/kg/min | INTRAVENOUS | Status: DC
  Administered 2011-09-26 – 2011-09-27 (×2): 1 ug/kg/min via INTRAVENOUS
  Filled 2011-09-26 (×2): qty 20

## 2011-09-26 MED ORDER — SODIUM CHLORIDE 0.9 % IV BOLUS (SEPSIS)
1000.0000 mL | Freq: Once | INTRAVENOUS | Status: AC
  Administered 2011-09-26: 1000 mL via INTRAVENOUS

## 2011-09-26 MED ORDER — PROPOFOL 10 MG/ML IV EMUL
5.0000 ug/kg/min | Freq: Once | INTRAVENOUS | Status: DC
  Administered 2011-09-26: 1000 mg via INTRAVENOUS

## 2011-09-26 MED ORDER — ETOMIDATE 2 MG/ML IV SOLN
INTRAVENOUS | Status: AC
  Administered 2011-09-26: 20 mg
  Filled 2011-09-26: qty 20

## 2011-09-26 MED ORDER — SODIUM CHLORIDE 0.9 % IV SOLN: 2000.0000 mL | Freq: Once | INTRAVENOUS | Status: DC

## 2011-09-26 MED ORDER — FENTANYL BOLUS VIA INFUSION
50.0000 ug | Freq: Four times a day (QID) | INTRAVENOUS | Status: DC | PRN
  Administered 2011-09-28: 25 ug via INTRAVENOUS
  Filled 2011-09-26: qty 100

## 2011-09-26 MED ORDER — LIDOCAINE HCL (CARDIAC) 20 MG/ML IV SOLN
INTRAVENOUS | Status: AC
  Filled 2011-09-26: qty 5

## 2011-09-26 MED ORDER — POTASSIUM CHLORIDE 20 MEQ/15ML (10%) PO LIQD
40.0000 meq | Freq: Once | ORAL | Status: AC
  Administered 2011-09-27: 40 meq

## 2011-09-26 MED ORDER — PROPOFOL 10 MG/ML IV EMUL
INTRAVENOUS | Status: AC
  Administered 2011-09-26: 1000 mg via INTRAVENOUS
  Filled 2011-09-26: qty 100

## 2011-09-26 MED ORDER — INSULIN REGULAR HUMAN 100 UNIT/ML IJ SOLN
INTRAMUSCULAR | Status: DC
  Administered 2011-09-26: 2.4 [IU]/h via INTRAVENOUS
  Administered 2011-09-27: 19.9 [IU]/h via INTRAVENOUS
  Administered 2011-09-27: 7 [IU]/h via INTRAVENOUS
  Filled 2011-09-26 (×4): qty 1

## 2011-09-26 MED ORDER — POTASSIUM CHLORIDE 10 MEQ/100ML IV SOLN
10.0000 meq | INTRAVENOUS | Status: DC
  Administered 2011-09-26: 10 meq via INTRAVENOUS
  Filled 2011-09-26 (×2): qty 100

## 2011-09-26 MED ORDER — SODIUM BICARBONATE 8.4 % IV SOLN
INTRAVENOUS | Status: AC | PRN
  Administered 2011-09-26: 50 meq via INTRAVENOUS

## 2011-09-26 MED ORDER — CHLORHEXIDINE GLUCONATE 0.12 % MT SOLN
15.0000 mL | Freq: Two times a day (BID) | OROMUCOSAL | Status: DC
  Administered 2011-09-26 – 2011-10-10 (×27): 15 mL via OROMUCOSAL
  Filled 2011-09-26 (×27): qty 15

## 2011-09-26 MED ORDER — PROPOFOL 10 MG/ML IV EMUL: 5.0000 ug/kg/min | INTRAVENOUS | Status: DC

## 2011-09-26 MED ORDER — ARTIFICIAL TEARS OP OINT
1.0000 "application " | TOPICAL_OINTMENT | Freq: Three times a day (TID) | OPHTHALMIC | Status: DC
  Administered 2011-09-26 – 2011-09-27 (×3): 1 via OPHTHALMIC
  Filled 2011-09-26: qty 3.5

## 2011-09-26 MED ORDER — NOREPINEPHRINE BITARTRATE 1 MG/ML IJ SOLN
2.0000 ug/min | INTRAVENOUS | Status: DC
  Administered 2011-09-26: 5 ug/min via INTRAVENOUS
  Administered 2011-09-27: 30 ug/min via INTRAVENOUS
  Filled 2011-09-26 (×2): qty 4

## 2011-09-26 MED ORDER — EPINEPHRINE HCL 0.1 MG/ML IJ SOLN
INTRAMUSCULAR | Status: AC | PRN
  Administered 2011-09-26 (×2): 1 via INTRAVENOUS

## 2011-09-26 MED ORDER — FENTANYL CITRATE 0.05 MG/ML IJ SOLN
INTRAMUSCULAR | Status: AC
  Administered 2011-09-26: 100 ug via INTRAVENOUS
  Filled 2011-09-26: qty 2

## 2011-09-26 MED ORDER — ROCURONIUM BROMIDE 50 MG/5ML IV SOLN
INTRAVENOUS | Status: AC
  Administered 2011-09-26: 80 mg
  Filled 2011-09-26: qty 2

## 2011-09-26 MED ORDER — SUCCINYLCHOLINE CHLORIDE 20 MG/ML IJ SOLN
INTRAMUSCULAR | Status: AC
  Filled 2011-09-26: qty 1

## 2011-09-26 MED ORDER — DEXTROSE 5 % IV SOLN: 2.0000 g | Freq: Once | INTRAVENOUS | Status: DC

## 2011-09-26 MED ORDER — CALCIUM CHLORIDE 10 % IV SOLN
INTRAVENOUS | Status: AC | PRN
  Administered 2011-09-26: 1 g via INTRAVENOUS

## 2011-09-26 MED ORDER — CISATRACURIUM BOLUS VIA INFUSION
0.1000 mg/kg | Freq: Once | INTRAVENOUS | Status: AC
  Administered 2011-09-26: 8.4 mg via INTRAVENOUS
  Filled 2011-09-26: qty 9

## 2011-09-26 MED ORDER — POTASSIUM CHLORIDE 10 MEQ/100ML IV SOLN
10.0000 meq | INTRAVENOUS | Status: AC
  Administered 2011-09-26 – 2011-09-27 (×6): 10 meq via INTRAVENOUS
  Filled 2011-09-26 (×3): qty 200

## 2011-09-26 MED ORDER — LORAZEPAM 2 MG/ML IJ SOLN
2.0000 mg | Freq: Once | INTRAMUSCULAR | Status: AC
  Administered 2011-09-26: 2 mg via INTRAVENOUS

## 2011-09-26 NOTE — ED Notes (Signed)
Ice packs removed.

## 2011-09-26 NOTE — H&P (Signed)
Name: Alicia Guerrero MRN: [id number] DOB: [birth year]    LOS: 0  Referring Provider:  [facility] ED MD Reason for Referral:  Cardiac arrest.  PULMONARY / CRITICAL CARE MEDICINE  HPI:  76 year old female with extensive cardiac history who presents to the [facility] ED with VT arrest in the [facility], patient required multiple shocks and amiodarone.  Unfortunately exact down time is unknown since it was a combination of field and ED downtime.  Patient was transferred to CICU in [facility] for hypothermia protocol and for cardiology to evaluate.  The patient has no contraindications for hypothermia protocol (coagulopathy is coumadin induced).  Patient arrived sedated and intubated.  Past Medical History  Diagnosis Date  . Left bundle branch block   . Atrial fibrillation   . Acute myocardial infarction, unspecified site, episode of care unspecified   . Coronary atherosclerosis   . Pure hypercholesterolemia   . Unspecified essential hypertension   . Cough   . Other atopic dermatitis and related conditions   . Acute upper respiratory infections of unspecified site   . Allergic rhinitis due to pollen   . Unspecified asthma   . Other and unspecified ovarian cyst   . Personal history of malignant neoplasm of breast   . Personal history of hyperthyroidism   . Chronic eczema     hands  . Hx of coronary angioplasty     percutaneous transluminal  . Hemorrhoids   . Cancer    Past Surgical History  Procedure Date  . Oophorectomy   . Abdominal hysterectomy   . Ptca   . Cholecystectomy   . Coronary stent placement may and  july 2012  . Breast lumpectomy 08/24/2003    right lumpectomy+sln,T2N0,ERPR+,Her2-  . Polypectomy   . Colonoscopy    Prior to Admission medications   Medication Sig Start Date End Date Taking? Authorizing Provider  warfarin (COUMADIN) 5 MG tablet Take 1 tablet (5 mg total) by mouth daily. 05/17/11 05/16/12 Yes Gaylord Shih, MD  amLODipine (NORVASC) 10 MG tablet Take 1 tablet (10 mg total) by mouth  daily. 06/14/11 06/13/12  Gaylord Shih, MD  aspirin EC 81 MG tablet Take 81 mg by mouth daily.    Historical Provider, MD  atorvastatin (LIPITOR) 40 MG tablet Take 1 tablet (40 mg total) by mouth daily. 02/27/11   Gaylord Shih, MD  Calcium-Vitamin D (CALTRATE 600 PLUS-VIT D PO) Take 1 tablet by mouth 2 (two) times daily.     Historical Provider, MD  fexofenadine (ALLEGRA) 60 MG tablet Take 60 mg by mouth daily as needed.    Historical Provider, MD  fluocinonide cream (LIDEX) 0.05 % Apply 1 application topically 2 (two) times daily as needed. rash    Historical Provider, MD  furosemide (LASIX) 40 MG tablet Take 1 tablet (40 mg total) by mouth daily. 02/27/11   Gaylord Shih, MD  Multiple Vitamin (MULTIVITAMIN) tablet Take 1 tablet by mouth daily.      Historical Provider, MD  nitroGLYCERIN (NITROSTAT) 0.4 MG SL tablet Place 0.4 mg under the tongue every 5 (five) minutes as needed.      Historical Provider, MD  OVER THE COUNTER MEDICATION Take 1 tablet by mouth daily. Calcium, Magnesium, Zinc, with D3    Historical Provider, MD  potassium chloride SA (K-DUR,KLOR-CON) 20 MEQ tablet Take 1 tablet (20 mEq total) by mouth daily. 02/27/11 02/27/12  Gaylord Shih, MD  trandolapril ([name]) 4 MG tablet Take 1 tablet (4 mg total) by mouth  daily. 02/27/11   Gaylord Shih, MD   Allergies Allergies  Allergen Reactions  . Meperidine Hcl Other (See Comments)    Mixed with phenergan swelling of the mouth   . Penicillins   . Molds & Smuts Other (See Comments)    Runny nose  . Tape Other (See Comments)    Redness, pulls skin off   Family History Family History  Problem Relation Age of Onset  . Uterine cancer Mother   . Hypertension Mother   . Coronary artery disease Father   . Heart disease Sister   . Heart disease Brother   . Endometrial cancer Sister   . Heart disease Brother   . Clotting disorder Brother     blood clot  . Colon cancer Neg Hx    Social History  reports that she has never smoked.  She has never used smokeless tobacco. She reports that she does not drink alcohol or use illicit drugs.  Review Of Systems:  Unattainble, patient is sedated and intubated.  Brief patient description:  76 year old female CAD history presenting for hypothermia protocol.   Events Since Admission: VT/VF arrest 9/10  Current Status: Critical.  Vital Signs: Temp:  [96.9 F (36.1 C)-98.2 F (36.8 C)] 97.9 F (36.6 C) (09/10 1620) Pulse Rate:  [65-91] 82  (09/10 1800) Resp:  [14-26] 14  (09/10 1640) BP: (92-162)/(48-144) 126/59 mmHg (09/10 1800) SpO2:  [97 %-100 %] 100 % (09/10 1800) FiO2 (%):  [100 %] 100 % (09/10 1801) Weight:  [83.9 kg (184 lb 15.5 oz)] 83.9 kg (184 lb 15.5 oz) (09/10 1754)  Physical Examination: General:  Chronically ill appearing female  Neuro:  Sedated and intubated, moves all ext to pain. HEENT:  [place]/AT, PERRL, EOM-I and MMM. Neck:  Supple, -LAN and -thyromegally. Cardiovascular:  RRR, Nl S1/S2, -M/R/G. Lungs:  Coarse BS diffusely. Abdomen:  Soft, NT, ND and +BS. Musculoskeletal:  -edema and -tenderness. Skin:  Intact.  Active Problems:  * No active hospital problems. *    ASSESSMENT AND PLAN  PULMONARY  Lab 09/26/11 1525  PHART 7.170*  PCO2ART 60.0*  PO2ART 140.0*  HCO3 21.0  O2SAT 98.6   Ventilator Settings: Vent Mode:  [-] PRVC FiO2 (%):  [100 %] 100 % Set Rate:  [15 bmp-20 bmp] 20 bmp Vt Set:  [440 mL-500 mL] 440 mL PEEP:  [5 cmH20] 5 cmH20 Plateau Pressure:  [20 cmH20-24 cmH20] 20 cmH20 CXR:  Pending. ETT:  9/10>>>  A:  VDRF post cardiac arrest and need for hypothermia protocol. P:   ABG pending. CXR pending. Full mechanical support.  CARDIOVASCULAR  Lab 09/26/11 1524  TROPONINI <0.30  LATICACIDVEN --  PROBNP --   ECG:  Pending Lines: L IJ TLC 9/10>>>  A: VT/VF arrest.  ?hypokalemia driven. P:  Hypothermia protocol. Cardiology consult. Amiodarone.  RENAL  Lab 09/26/11 1524  NA 142  K 2.1*  CL 97  CO2 23  BUN  15  CREATININE 0.97  CALCIUM 11.7*  MG 2.6*  PHOS 7.2*   Intake/Output    None    Foley:  In place.  A:  Hypokalemia, ?lasix use. P:   Aggressive replacement. Recheck after K is replaced.  GASTROINTESTINAL No results found for this basename: AST:5,ALT:5,ALKPHOS:5,BILITOT:5,PROT:5,ALBUMIN:5 in the last 168 hours  A:  No active issues. P:   NPO. Feed once hypothermia is complete.  HEMATOLOGIC  Lab 09/26/11 1524  HGB 12.7  HCT 39.7  PLT 204  INR 2.71*  APTT --  A:  No active issues. P:  Monitor.  INFECTIOUS  Lab 09/26/11 1524  WBC 9.3  PROCALCITON --   Cultures: No cultures Antibiotics: None  A:  No active issues. P:   Monitor fever curve and WBC.  ENDOCRINE No results found for this basename: GLUCAP:5 in the last 168 hours A:  No DM history.   P:   Monitor CBG.  NEUROLOGIC  A:  CT negative. P:   Sedate while undergoing hypothermia protocol.  CC time 55 min.  Koren Bound, M.D. Pulmonary and Critical Care Medicine [facility] Pager: [phone]  09/26/2011, 6:28 PM

## 2011-09-26 NOTE — Progress Notes (Signed)
*  PRELIMINARY RESULTS* Echocardiogram 2D Echocardiogram has been performed.  Alicia Guerrero 09/26/2011, 8:14 PM

## 2011-09-26 NOTE — ED Notes (Signed)
Bloody drainage from OG tube.

## 2011-09-26 NOTE — ED Notes (Signed)
Witnessed arrest at [facility] - EMS arrived and pt was in [place].  Gave Epi 1 amp x 3 en route, amiodarone 150mg  x 1 and 300mg  x 1, atropine 1 amp x1, sod bicarb 1 amp x 1.  Shocked x 5 en route at 200J.  PEA upon arrival.  CBG 264.

## 2011-09-26 NOTE — Progress Notes (Signed)
eLink Physician-Brief Progress Note Patient Name: Alicia Guerrero DOB: 1935/04/30 MRN: [id number]  Date of Service  09/26/2011   HPI/Events of Note   CBG high  eICU Interventions  Start insulin gtt Add K via tube in addition to IV - since insulin may drop this further   Intervention Category Major Interventions: Electrolyte abnormality - evaluation and management Intermediate Interventions: Hyperglycemia - evaluation and treatment  Tyaisha Cullom V. 09/26/2011, 8:15 PM

## 2011-09-26 NOTE — Procedures (Signed)
Central Venous Catheter Insertion Procedure Note Alicia Guerrero [id number] 02/16/1935  Procedure: Insertion of Central Venous Catheter Indications: Assessment of intravascular volume and Drug and/or fluid administration  Procedure Details Consent: Unable to obtain consent because of emergent medical necessity. Time Out: Verified patient identification, verified procedure, site/side was marked, verified correct patient position, special equipment/implants available, medications/allergies/relevent history reviewed, required imaging and test results available.  Performed  Maximum sterile technique was used including antiseptics, cap, gloves, gown, hand hygiene, mask and sheet. Skin prep: Chlorhexidine; local anesthetic administered A antimicrobial bonded/coated triple lumen catheter was placed in the left internal jugular vein using the Seldinger technique.  Evaluation Blood flow good Complications: No apparent complications Patient did tolerate procedure well. Chest X-ray ordered to verify placement.  CXR: pending.  U/S used in placement.  Alicia Guerrero,Alicia Guerrero 09/26/2011, 6:44 PM

## 2011-09-26 NOTE — Progress Notes (Signed)
Pt taken off vent, transported to [facility], reinitiated mechanical ventilation.

## 2011-09-26 NOTE — Progress Notes (Signed)
eLink Physician-Brief Progress Note Patient Name: Alicia Guerrero DOB: July 22, 1935 MRN: [id number]  Date of Service  09/26/2011   HPI/Events of Note   perioidc runs of NS VTAch  Per RN current K is 2.6 on istat Making urine Havin diarrhea  eICU Interventions  kcl via tube and via IV 2gm mag sulfate amio if no response to above (d/w Dr Armanda Magic)   Intervention Category Major Interventions: Electrolyte abnormality - evaluation and management Intermediate Interventions: Arrhythmia - evaluation and management  Dontreal Miera 09/26/2011, 11:58 PM

## 2011-09-26 NOTE — ED Notes (Signed)
CRITICAL VALUE ALERT  Critical value received: potassium 2.1 (lab reported sample was a little hemolyzed)  Date of notification:  [facility]  Time of notification:  1624  Critical value read back:yes  Nurse who received alert: [facility]  MD notified (1st page): Dr. Juleen China  Time of first page: 1625  MD notified (2nd page):  Time of second page:  Responding MD: Dr. Juleen China  Time MD responded: [phone]

## 2011-09-26 NOTE — ED Provider Notes (Signed)
History    76yF with arrest shortly before arrival. Was at [facility] and witnessed by multiple bystanders. Pt with vfib on EMS arrival.  PEA just before arrival to ED. electrocardioversion x5 PTA.  Amiodarone 450mg . Epi x3. biacrb x1. Atropine x1. Pt apparently in usual state of health just shortly before this.  No report of trauma aside from fall to floor from standing when she collapsed. On arrival to ED, pt with PEA with CPR in progress.  CSN: [id number]  Arrival date & time 09/26/11  1515   First MD Initiated Contact with Patient 09/26/11 1524      Chief Complaint  Patient presents with  . cpr     (Consider location/radiation/quality/duration/timing/severity/associated sxs/prior treatment) HPI  Past Medical History  Diagnosis Date  . Left bundle branch block   . Atrial fibrillation   . Acute myocardial infarction, unspecified site, episode of care unspecified   . Coronary atherosclerosis   . Pure hypercholesterolemia   . Unspecified essential hypertension   . Cough   . Other atopic dermatitis and related conditions   . Acute upper respiratory infections of unspecified site   . Allergic rhinitis due to pollen   . Unspecified asthma   . Other and unspecified ovarian cyst   . Personal history of malignant neoplasm of breast   . Personal history of hyperthyroidism   . Chronic eczema     hands  . Hx of coronary angioplasty     percutaneous transluminal  . Hemorrhoids   . Cancer     Past Surgical History  Procedure Date  . Oophorectomy   . Abdominal hysterectomy   . Ptca   . Cholecystectomy   . Coronary stent placement may and  july 2012  . Breast lumpectomy 08/24/2003    right lumpectomy+sln,T2N0,ERPR+,Her2-  . Polypectomy   . Colonoscopy     Family History  Problem Relation Age of Onset  . Uterine cancer Mother   . Hypertension Mother   . Coronary artery disease Father   . Heart disease Sister   . Heart disease Brother   . Endometrial cancer Sister   . Heart  disease Brother   . Clotting disorder Brother     blood clot  . Colon cancer Neg Hx     History  Substance Use Topics  . Smoking status: Never Smoker   . Smokeless tobacco: Never Used  . Alcohol Use: No    OB History    Grav Para Term Preterm Abortions TAB SAB Ect Mult Living                  Review of Systems  Level 5 caveat applies because pt is extreme distress.  Allergies  Meperidine hcl; Penicillins; Molds & smuts; and Tape  Home Medications   Current Outpatient Rx  Name Route Sig Dispense Refill  . AMLODIPINE BESYLATE 10 MG PO TABS Oral Take 1 tablet (10 mg total) by mouth daily. 90 tablet 3  . ASPIRIN EC 81 MG PO TBEC Oral Take 81 mg by mouth daily.    . ATORVASTATIN CALCIUM 40 MG PO TABS Oral Take 1 tablet (40 mg total) by mouth daily. 90 tablet 2  . CALTRATE 600 PLUS-VIT D PO Oral Take 1 tablet by mouth 2 (two) times daily.     Marland Kitchen FEXOFENADINE HCL 60 MG PO TABS Oral Take 60 mg by mouth daily as needed.    Marland Kitchen FLUOCINONIDE 0.05 % EX CREA Topical Apply 1 application topically 2 (two) times daily  as needed. rash    . FUROSEMIDE 40 MG PO TABS Oral Take 1 tablet (40 mg total) by mouth daily. 90 tablet 3  . ONE-DAILY MULTI VITAMINS PO TABS Oral Take 1 tablet by mouth daily.      Marland Kitchen NITROGLYCERIN 0.4 MG SL SUBL Sublingual Place 0.4 mg under the tongue every 5 (five) minutes as needed.      Marland Kitchen OVER THE COUNTER MEDICATION Oral Take 1 tablet by mouth daily. Calcium, Magnesium, Zinc, with D3    . POTASSIUM CHLORIDE CRYS ER 20 MEQ PO TBCR Oral Take 1 tablet (20 mEq total) by mouth daily. 90 tablet 3  . TRANDOLAPRIL 4 MG PO TABS Oral Take 1 tablet (4 mg total) by mouth daily. 90 tablet 3  . WARFARIN SODIUM 5 MG PO TABS Oral Take 1 tablet (5 mg total) by mouth daily. 30 tablet 11    BP 96/61  Pulse 80  Temp 97.7 F (36.5 C)  Resp 21  SpO2 100%  Physical Exam  Nursing note and vitals reviewed. Constitutional: She appears distressed.       CPR in progress  HENT:  Head:  Atraumatic.  Eyes: Pupils are equal, round, and reactive to light.       Pupils 2-15mm and reactive  Cardiovascular:       No palpable carotid or femoral pulses.   Pulmonary/Chest: She is in respiratory distress.       King airway. B/l and equal breath sounds. Symmetric chest rise. Vomitus noted in tube and in oropharynx.   Abdominal: Soft. She exhibits distension.  Musculoskeletal:       No external signs of trauma  Neurological:       GCS 3T  Skin: Skin is warm and dry.  Psychiatric:       Unable to assess.    ED Course  INTUBATION Date/Time: 09/26/2011 4:18 PM Performed by: Raeford Razor Authorized by: Raeford Razor Consent: The procedure was performed in an emergent situation. Required items: required blood products, implants, devices, and special equipment available Indications: airway protection and respiratory distress Intubation method: direct Patient status: paralyzed (RSI) Sedatives: etomidate Paralytic: rocuronium Laryngoscope size: Mac 3 Tube size: 8.0 mm Tube type: cuffed Number of attempts: 1 Cricoid pressure: yes Cords visualized: yes Post-procedure assessment: chest rise and CO2 detector Breath sounds: equal and absent over the epigastrium Cuff inflated: yes ETT to lip: 25 cm Tube secured with: ETT holder Patient tolerance: Patient tolerated the procedure well with no immediate complications.  NG placement Date/Time: 09/26/2011 4:19 PM Performed by: Raeford Razor Authorized by: Raeford Razor Consent: The procedure was performed in an emergent situation. Local anesthesia used: no Patient sedated: yes Patient tolerance: Patient tolerated the procedure well with no immediate complications. Comments: 38F gastric tube place orally   CRITICAL CARE Performed by: Raeford Razor   Total critical care time: 75 minutes  Critical care time was exclusive of separately billable procedures and treating other patients.  Critical care was necessary to  treat or prevent imminent or life-threatening deterioration.  Critical care was time spent personally by me on the following activities: development of treatment plan with patient and/or surrogate as well as nursing, discussions with consultants, evaluation of patient's response to treatment, examination of patient, obtaining history from patient or surrogate, ordering and performing treatments and interventions, ordering and review of laboratory studies, ordering and review of radiographic studies, pulse oximetry and re-evaluation of patient's condition.   (including critical care time)  Labs Reviewed  BLOOD GAS,  ARTERIAL - Abnormal; Notable for the following:    pH, Arterial 7.170 (*)     pCO2 arterial 60.0 (*)     pO2, Arterial 140.0 (*)     Acid-base deficit 6.3 (*)     All other components within normal limits  BASIC METABOLIC PANEL - Abnormal; Notable for the following:    Potassium 2.1 (*)     Glucose, Bld 415 (*)     Calcium 11.7 (*)     GFR calc non Af Amer 56 (*)     GFR calc Af Amer 65 (*)     All other components within normal limits  MAGNESIUM - Abnormal; Notable for the following:    Magnesium 2.6 (*)     All other components within normal limits  PHOSPHORUS - Abnormal; Notable for the following:    Phosphorus 7.2 (*)     All other components within normal limits  PROTIME-INR - Abnormal; Notable for the following:    Prothrombin Time 29.2 (*)     INR 2.71 (*)     All other components within normal limits  CBC  TROPONIN I   Ct Head Wo Contrast  09/26/2011  *RADIOLOGY REPORT*  Clinical Data: The outpatient.  CT HEAD WITHOUT CONTRAST  Technique:  Contiguous axial images were obtained from the base of the skull through the vertex without contrast.  Comparison: None.  Findings: The ventricles are normal in size for this patient's age and are normal in configuration.  There are no parenchymal masses or mass effect.  There is a small area of well defined hypoattenuation in  the left lateral frontal lobe likely an area of previous infarction.  Patchy areas of white matter hypoattenuation are noted bilaterally most consistent with moderate chronic microvascular ischemic change.  There is no convincing recent transcortical infarct.  There are no extra-axial masses or abnormal fluid collections. There is no intracranial hemorrhage.  The visualized left maxillary sinus is mostly opacified.  The remaining visualized sinuses are essentially clear as are the mastoid air cells.  No skull lesion.  IMPRESSION: No acute intracranial abnormalities.  Age related volume loss, evidence of a small old left frontal infarct and moderate chronic microvascular ischemic change.   Original Report Authenticated By: Domenic Moras, M.D.    Dg Chest Portable 1 View  09/26/2011  *RADIOLOGY REPORT*  Clinical Data: Intubated.  PORTABLE CHEST - 1 VIEW  Comparison: 08/14/2010  Findings: Leanor Kail is difficult to visualize.  Endotracheal tube is likely approximately 3-4 cm above the carina.  Cardiomegaly.  NG tube is in the stomach.  Suspect bilateral perihilar and upper lobe airspace opacities.  IMPRESSION: Endotracheal tube approximately 304 cm above the carina.  Suspect bilateral perihilar and upper lobe airspace opacities.   Original Report Authenticated By: Cyndie Chime, M.D.     EKG:  Rhythm: wide complex regular rhythm. p waves not clearly visible. Possibly superimposed on t waves which would make marked 1st degree AV block. Rate: 70 Axis: left Intervals: widened QRS. ST segments: NS ST changes   1. Cardiac arrest - ventricular fibrillation   2. Respiratory failure   3. Hypokalemia   4. Anticoagulated on Coumadin       MDM  76yF with witnessed arrest. vfib with elctro cardioversion attempted x5 pre-hospital. Received amiodarone 450mg . PEA on arrival to ED. CPR continued per ACLS guidelines continued. On rhythm checks appeared to have wide complex regular rhythm around 100. Slow to be  v-tach but not pulses palpable. Cardioversion  x2 additional times. Eventual ROSC with good BP. Pt arrived with [facility] airway in place. Placement confirmed but pt vomiting and aspirating around it and NG/OG difficult to place. Tube replaced with 8.0 ETT and then subsequent OG. Significant cardiac hx per review of records. Known CAD. Afib on coumadin. Discussed with CCM. Requesting head CT prior to departure. Not hypothermia candidate given on coumadin and INR 2.7. Discussed with husband and son prior to departure. After ROSC and prior to RSI, pt with respiratory effort and moving R sided extremities spontaneously. Full code.        Raeford Razor, MD 09/26/11 [phone]

## 2011-09-26 NOTE — Procedures (Signed)
Intubation Procedure Note RAEJEAN CODA [id number] 21-May-1935  Procedure: Intubation Indications: Airway protection and maintenance  Procedure Details Consent: Unable to obtain consent because of emergent medical necessity. Time Out: Verified patient identification, verified procedure, site/side was marked, verified correct patient position, special equipment/implants available, medications/allergies/relevent history reviewed, required imaging and test results available.  Performed  Maximum sterile technique was used including gloves.  MAC    Evaluation Hemodynamic Status: BP stable throughout; O2 sats: transiently fell during during procedure Patient's Current Condition: stable Complications: No apparent complications Patient did tolerate procedure well. Chest X-ray ordered to verify placement.  CXR: pending.   Katheren Shams 09/26/2011

## 2011-09-26 NOTE — ED Notes (Signed)
Ice packs applied all over body.

## 2011-09-26 NOTE — Procedures (Signed)
Arterial Catheter Insertion Procedure Note Alicia Guerrero [id number] 12/08/1935  Procedure: Insertion of Arterial Catheter  Indications: Blood pressure monitoring  Procedure Details Consent: Unable to obtain consent because of emergent medical necessity. Time Out: Verified patient identification, verified procedure, site/side was marked, verified correct patient position, special equipment/implants available, medications/allergies/relevent history reviewed, required imaging and test results available.  Performed  Maximum sterile technique was used including antiseptics, cap, gloves, gown, hand hygiene, mask and sheet. Skin prep: Chlorhexidine; local anesthetic administered 20 gauge catheter was inserted into right radial artery using the Seldinger technique.  Evaluation Blood flow good; BP tracing good. Complications: No apparent complications.   YACOUB,WESAM 09/26/2011

## 2011-09-26 NOTE — Progress Notes (Signed)
eLink Physician-Brief Progress Note Patient Name: Alicia Guerrero DOB: 1935-09-28 MRN: [id number]  Date of Service  09/26/2011   HPI/Events of Note   Pt arrived from [facility] post VT/VF arrest, received 1 L cold saline. On propofol gtt, pupils lt sluggish, rt NRTL  eICU Interventions  Initiate hypothermia protocol Cont sedation with propofol/ fentnayl 12 lead ekg Vent orders written   Intervention Category Major Interventions: Other:  Cynde Menard V. 09/26/2011, 6:08 PM

## 2011-09-26 NOTE — Consult Note (Signed)
Patient Name: Alicia Guerrero  MRN: [id number]  HPI: Alicia Guerrero is an 76 y.o. femalereferred for consultation by Dr.Wesam Santa Genera, MD for ventricular fibrillation cardiac arrest.  Patient suffered a witnessed arrest and a public facility and was found to be in VF by EMS. Resuscitation in the field was apparently unsuccessful, but PEA was noted immediately prior to arrival in the emergency department. She subsequently required multiple defibrillations as treatment with amiodarone, but eventually sinus rhythm and blood pressure were restored. She was transferred to [facility] where mental status was markedly impaired prompting initiation of systemic cooling. First troponin was normal, but second troponin is 2.2. EKG shows left bundle branch block, which is known to be chronic. Initial laboratory notable for a potassium of 2.1.  Most recent previous value was 4.0 and 07/2011. Renal function is normal. Glucose has been markedly elevated.  Past Medical History  Diagnosis Date  . Left bundle branch block   . Atrial fibrillation   . Acute myocardial infarction, unspecified site, episode of care unspecified   . Coronary atherosclerosis: 2 previous myocardial infarctions and multiple stents, most recently in 04/2010 when a BMS was placed in the first diagonal. An 80% distal LAD, 50% mid RCA and 60% circumflex stenosis were present at that time, EF was 55%.    Marland Kitchen  Hyperlipidemia    .  Hypertension    .  Breast carcinoma    .  Hypothyroid      Past Surgical History  Procedure Date  . Oophorectomy   . Abdominal hysterectomy   . Cholecystectomy   . Coronary stent placement x4  may and  july 2012  . Breast lumpectomy 08/24/2003    right lumpectomy+sln,T2N0,ERPR+,Her2-  . Polypectomy   . Colonoscopy    Family History  Problem Relation Age of Onset  . Uterine cancer Mother   . Hypertension Mother   . Coronary artery disease Father   . Heart disease Sister   . Heart disease Brother   .  Endometrial cancer Sister   . Heart disease Brother   . Clotting disorder Brother     blood clot  . Colon cancer Neg Hx    Social History:  reports that she has never smoked. She has never used smokeless tobacco. She reports that she does not drink alcohol or use illicit drugs.  Allergies:  Allergies  Allergen Reactions  . Meperidine Hcl Other (See Comments)    Mixed with phenergan swelling of the mouth   . Penicillins Other (See Comments)    Unknown reaction  . Molds & Smuts Other (See Comments)    Runny nose  . Tape Other (See Comments)    Redness, pulls skin off   Medications:  I have reviewed the patient's current medications. Prior to Admission:  Prescriptions prior to admission  Medication Sig Dispense Refill  . atorvastatin (LIPITOR) 40 MG tablet Take 1 tablet (40 mg total) by mouth daily.  90 tablet  2  . Calcium Carbonate-Vit D-Min (CALCIUM 1200) 1200-1000 MG-UNIT CHEW Chew 1 tablet by mouth 2 (two) times daily.      . carvedilol (COREG) 6.25 MG tablet Take 6.25 mg by mouth 2 (two) times daily with a meal.      . clopidogrel (PLAVIX) 75 MG tablet Take 75 mg by mouth daily.      . furosemide (LASIX) 40 MG tablet Take 1 tablet (40 mg total) by mouth daily.  90 tablet  3  . trandolapril ([name]) 4  MG tablet Take 1 tablet (4 mg total) by mouth daily.  90 tablet  3  . warfarin (COUMADIN) 5 MG tablet Take 1 tablet (5 mg total) by mouth daily.  30 tablet  11  . amLODipine (NORVASC) 10 MG tablet Take 1 tablet (10 mg total) by mouth daily.  90 tablet  3  . aspirin EC 81 MG tablet Take 81 mg by mouth every evening.       . potassium chloride SA (K-DUR,KLOR-CON) 20 MEQ tablet Take 1 tablet (20 mEq total) by mouth daily.  90 tablet  3   Results for orders placed during the hospital encounter of 09/26/11 (from the past 48 hour(s))  CBC     Status: Normal   Collection Time   09/26/11  3:24 PM      Component Value Range Comment   WBC 9.3  4.0 - 10.5 K/uL    RBC 4.72  3.87 - 5.11  MIL/uL    Hemoglobin 12.7  12.0 - 15.0 g/dL    HCT 14.7  82.9 - 56.2 %    MCV 84.1  78.0 - 100.0 fL    MCH 26.9  26.0 - 34.0 pg    MCHC 32.0  30.0 - 36.0 g/dL    RDW 13.0  86.5 - 78.4 %    Platelets 204  150 - 400 K/uL   BASIC METABOLIC PANEL     Status: Abnormal   Collection Time   09/26/11  3:24 PM      Component Value Range Comment   Sodium 142  135 - 145 mEq/L    Potassium 2.1 (*) 3.5 - 5.1 mEq/L    Chloride 97  96 - 112 mEq/L    CO2 23  19 - 32 mEq/L    Glucose, Bld 415 (*) 70 - 99 mg/dL    BUN 15  6 - 23 mg/dL    Creatinine, Ser 6.96  0.50 - 1.10 mg/dL    Calcium 29.5 (*) 8.4 - 10.5 mg/dL    GFR calc non Af Amer 56 (*) >90 mL/min    GFR calc Af Amer 65 (*) >90 mL/min   TROPONIN I     Status: Normal   Collection Time   09/26/11  3:24 PM      Component Value Range Comment   Troponin I <0.30  <0.30 ng/mL   MAGNESIUM     Status: Abnormal   Collection Time   09/26/11  3:24 PM      Component Value Range Comment   Magnesium 2.6 (*) 1.5 - 2.5 mg/dL   PHOSPHORUS     Status: Abnormal   Collection Time   09/26/11  3:24 PM      Component Value Range Comment   Phosphorus 7.2 (*) 2.3 - 4.6 mg/dL   PROTIME-INR     Status: Abnormal   Collection Time   09/26/11  3:24 PM      Component Value Range Comment   Prothrombin Time 29.2 (*) 11.6 - 15.2 seconds    INR 2.71 (*) 0.00 - 1.49   BLOOD GAS, ARTERIAL     Status: Abnormal   Collection Time   09/26/11  3:25 PM      Component Value Range Comment   FIO2 100.00      Delivery systems MANUAL VENTILATION      pH, Arterial 7.170 (*) 7.350 - 7.450    pCO2 arterial 60.0 (*) 35.0 - 45.0 mmHg    pO2, Arterial 140.0 (*) 80.0 -  100.0 mmHg    Bicarbonate 21.0  20.0 - 24.0 mEq/L    TCO2 20.0  0 - 100 mmol/L    Acid-base deficit 6.3 (*) 0.0 - 2.0 mmol/L    O2 Saturation 98.6      Patient temperature 37.0      Collection site LEFT RADIAL      Drawn by [id number]      Sample type ARTERIAL DRAW      Allens test (pass/fail) PASS  PASS   MRSA PCR  SCREENING     Status: Normal   Collection Time   09/26/11  6:01 PM      Component Value Range Comment   MRSA by PCR NEGATIVE  NEGATIVE   PROTIME-INR     Status: Abnormal   Collection Time   09/26/11  6:02 PM      Component Value Range Comment   Prothrombin Time 31.3 (*) 11.6 - 15.2 seconds    INR 2.96 (*) 0.00 - 1.49   APTT     Status: Abnormal   Collection Time   09/26/11  6:02 PM      Component Value Range Comment   aPTT 39 (*) 24 - 37 seconds   CBC WITH DIFFERENTIAL     Status: Abnormal   Collection Time   09/26/11  6:02 PM      Component Value Range Comment   WBC 24.4 (*) 4.0 - 10.5 K/uL    RBC 5.27 (*) 3.87 - 5.11 MIL/uL    Hemoglobin 14.0  12.0 - 15.0 g/dL    HCT 29.5  62.1 - 30.8 %    MCV 81.6  78.0 - 100.0 fL    MCH 26.6  26.0 - 34.0 pg    MCHC 32.6  30.0 - 36.0 g/dL    RDW 65.7  84.6 - 96.2 %    Platelets 277  150 - 400 K/uL    Neutrophils Relative 90 (*) 43 - 77 %    Lymphocytes Relative 8 (*) 12 - 46 %    Monocytes Relative 2 (*) 3 - 12 %    Eosinophils Relative 0  0 - 5 %    Basophils Relative 0  0 - 1 %    Band Neutrophils 0  0 - 10 %    Metamyelocytes Relative 0      Myelocytes 0      Promyelocytes Absolute 0      Blasts 0      nRBC 0  0 /100 WBC    Neutro Abs 21.9 (*) 1.7 - 7.7 K/uL    Lymphs Abs 2.0  0.7 - 4.0 K/uL    Monocytes Absolute 0.5  0.1 - 1.0 K/uL    Eosinophils Absolute 0.0  0.0 - 0.7 K/uL    Basophils Absolute 0.0  0.0 - 0.1 K/uL    Smear Review MORPHOLOGY UNREMARKABLE     COMPREHENSIVE METABOLIC PANEL     Status: Abnormal   Collection Time   09/26/11  6:02 PM      Component Value Range Comment   Sodium 144  135 - 145 mEq/L    Potassium 2.5 (*) 3.5 - 5.1 mEq/L    Chloride 104  96 - 112 mEq/L    CO2 23  19 - 32 mEq/L    Glucose, Bld 360 (*) 70 - 99 mg/dL    BUN 17  6 - 23 mg/dL    Creatinine, Ser 9.52  0.50 - 1.10 mg/dL    Calcium  9.6  8.4 - 10.5 mg/dL    Total Protein 6.9  6.0 - 8.3 g/dL    Albumin 3.6  3.5 - 5.2 g/dL    AST 295 (*) 0  - 37 U/L    ALT 281 (*) 0 - 35 U/L    Alkaline Phosphatase 111  39 - 117 U/L    Total Bilirubin 0.9  0.3 - 1.2 mg/dL    GFR calc non Af Amer 65 (*) >90 mL/min    GFR calc Af Amer 75 (*) >90 mL/min   MAGNESIUM     Status: Normal   Collection Time   09/26/11  6:02 PM      Component Value Range Comment   Magnesium 1.9  1.5 - 2.5 mg/dL   PHOSPHORUS     Status: Normal   Collection Time   09/26/11  6:02 PM      Component Value Range Comment   Phosphorus 3.8  2.3 - 4.6 mg/dL   TROPONIN I     Status: Abnormal   Collection Time   09/26/11  6:08 PM      Component Value Range Comment   Troponin I 2.19 (*) <0.30 ng/mL   PRO B NATRIURETIC PEPTIDE     Status: Normal   Collection Time   09/26/11  6:08 PM      Component Value Range Comment   Pro B Natriuretic peptide (BNP) 116.0  0 - 125 pg/mL   POCT I-STAT 3, BLOOD GAS (G3+)     Status: Abnormal   Collection Time   09/26/11  6:40 PM      Component Value Range Comment   pH, Arterial 7.318 (*) 7.350 - 7.450    pCO2 arterial 47.6 (*) 35.0 - 45.0 mmHg    pO2, Arterial 92.0  80.0 - 100.0 mmHg    Bicarbonate 24.9 (*) 20.0 - 24.0 mEq/L    TCO2 26  0 - 100 mmol/L    O2 Saturation 97.0      Acid-base deficit 2.0  0.0 - 2.0 mmol/L    Patient temperature 95.5 F      Collection site RADIAL, ALLEN'S TEST ACCEPTABLE      Drawn by VENIPUNCTURE      Sample type ARTERIAL     BASIC METABOLIC PANEL     Status: Abnormal   Collection Time   09/26/11  7:35 PM      Component Value Range Comment   Sodium 143  135 - 145 mEq/L    Potassium 2.5 (*) 3.5 - 5.1 mEq/L    Chloride 104  96 - 112 mEq/L    CO2 23  19 - 32 mEq/L    Glucose, Bld 376 (*) 70 - 99 mg/dL    BUN 18  6 - 23 mg/dL    Creatinine, Ser 6.21  0.50 - 1.10 mg/dL    Calcium 9.2  8.4 - 30.8 mg/dL    GFR calc non Af Amer 66 (*) >90 mL/min    GFR calc Af Amer 76 (*) >90 mL/min   LACTIC ACID, PLASMA     Status: Abnormal   Collection Time   09/26/11  7:35 PM      Component Value Range Comment   Lactic  Acid, Venous 5.1 (*) 0.5 - 2.2 mmol/L   GLUCOSE, CAPILLARY     Status: Abnormal   Collection Time   09/26/11  7:50 PM      Component Value Range Comment   Glucose-Capillary 341 (*) 70 - 99  mg/dL   Ct Head Wo Contrast 1/61/09: No acute intracranial abnormality; chronic microvascular ischemic change; small left frontal infarction  Dg Chest Port 1 View 09/26/11: Improved upper lobe pulmonary edema; atherosclerosis in the aorta; good position of tubes and lines without pneumothorax  Review of Systems: Unobtainable  Physical Exam: Blood pressure 116/65, pulse 74, temperature 92.7 F (33.7 C), temperature source Axillary, resp. rate 20, height 5' 3.5" (1.613 m), weight 83.9 kg (184 lb 15.5 oz), SpO2 97.00%.  General-Well-developed; sedated, paralyzed and intubated HEENT-[place]/AT; conjunctiva and lids nl Neck-No JVD; no carotid bruits Endocrine-No thyromegaly Lungs-mild inspiratory and expiratory rhonchi; resonant percussion; normal I-to-E ratio Cardiovascular- normal PMI; distant S1 and S2 Abdomen-unable to examine secondary to cooling apparatus Musculoskeletal-No deformities, cyanosis or clubbing Neurologic-pupils 5 mm and minimally reactive to light; no spontaneous movement Skin- Warm, no significant lesions; pallor Extremities-1+ distal pulses; no edema  EKG:  #1-right bundle branch block plus left anterior fascicular block representing new conduction abnormality since previous tracing of 05/2011 at which time an incomplete left bundle branch block was present.             #2-left bundle branch block  Assessment/Plan:   Ventricular fibrillation cardiac arrest: No indication from history the chest discomfort or other symptoms proceeded arrhythmia, but available information is sparse.  Hypokalemia may have contributed to a primary arrhythmia; however, hypokalemia can result from the stress and hyperadrenergic state following primary ventricular arrhythmia. New inferior wall motion abnormality  suggests interval myocardial infarction since 04/2010, which could represent an acute event. Initial cardiac markers are not helpful. Negative values on presentation may have preceded an increase attributable to acute injury. Modestly increased troponin at 5 hours after event may reflect injury related to arrest and resuscitation. Since patient did not reach [facility] until 4 hours following event and the possibility of acute myocardial infarction was not clarified for 2 additional hours, I'm not inclined to proceed with urgent cardiac catheterization.  Moreover, if this is an acute myocardial infarction, and it is an inferior event with fairly well preserved overall LV systolic function and no apparent RV involvement, this should carry a very low mortality.  The major issue now is recovery of neurologic function. Neurology consultation can be requested in the morning. Serial cardiac markers to be performed.  Treatment with beta blocker and ACE inhibitor can be initiated once she is weaned from pressors.  Heparin will be added if next set of cardiac markers is substantially higher-for now she is therapeutically anticoagulated with warfarin, which should be held for the time being.  Potassium replacement is in progress.  Alternating bundle branch block indicates substantial impairment of the conduction system and the possibility of the development of high-grade AV block.   Nelson Bing, MD 09/26/2011, 10:35 PM

## 2011-09-27 ENCOUNTER — Inpatient Hospital Stay (HOSPITAL_COMMUNITY): Payer: Medicare Other

## 2011-09-27 DIAGNOSIS — I447 Left bundle-branch block, unspecified: Secondary | ICD-10-CM

## 2011-09-27 DIAGNOSIS — R579 Shock, unspecified: Secondary | ICD-10-CM

## 2011-09-27 LAB — LACTIC ACID, PLASMA: Lactic Acid, Venous: 6.5 mmol/L — ABNORMAL HIGH (ref 0.5–2.2)

## 2011-09-27 LAB — GLUCOSE, CAPILLARY
Glucose-Capillary: 148 mg/dL — ABNORMAL HIGH (ref 70–99)
Glucose-Capillary: 201 mg/dL — ABNORMAL HIGH (ref 70–99)
Glucose-Capillary: 233 mg/dL — ABNORMAL HIGH (ref 70–99)
Glucose-Capillary: 256 mg/dL — ABNORMAL HIGH (ref 70–99)
Glucose-Capillary: 272 mg/dL — ABNORMAL HIGH (ref 70–99)
Glucose-Capillary: 276 mg/dL — ABNORMAL HIGH (ref 70–99)
Glucose-Capillary: 305 mg/dL — ABNORMAL HIGH (ref 70–99)
Glucose-Capillary: 305 mg/dL — ABNORMAL HIGH (ref 70–99)
Glucose-Capillary: 318 mg/dL — ABNORMAL HIGH (ref 70–99)
Glucose-Capillary: 327 mg/dL — ABNORMAL HIGH (ref 70–99)
Glucose-Capillary: 344 mg/dL — ABNORMAL HIGH (ref 70–99)
Glucose-Capillary: 363 mg/dL — ABNORMAL HIGH (ref 70–99)

## 2011-09-27 LAB — CBC
HCT: 43.6 % (ref 36.0–46.0)
Hemoglobin: 14.4 g/dL (ref 12.0–15.0)
MCV: 81.2 fL (ref 78.0–100.0)
RDW: 14.1 % (ref 11.5–15.5)
WBC: 18.7 10*3/uL — ABNORMAL HIGH (ref 4.0–10.5)

## 2011-09-27 LAB — BLOOD GAS, ARTERIAL
Acid-base deficit: 12.4 mmol/L — ABNORMAL HIGH (ref 0.0–2.0)
Bicarbonate: 15.1 mEq/L — ABNORMAL LOW (ref 20.0–24.0)
O2 Saturation: 93.9 %
PEEP: 5 cmH2O
Patient temperature: 91.4
RATE: 20 resp/min

## 2011-09-27 LAB — BASIC METABOLIC PANEL
BUN: 16 mg/dL (ref 6–23)
BUN: 15 mg/dL (ref 6–23)
BUN: 13 mg/dL (ref 6–23)
CO2: 15 mEq/L — ABNORMAL LOW (ref 19–32)
CO2: 24 mEq/L (ref 19–32)
Calcium: 7.1 mg/dL — ABNORMAL LOW (ref 8.4–10.5)
Calcium: 6.3 mg/dL — CL (ref 8.4–10.5)
Calcium: 7.3 mg/dL — ABNORMAL LOW (ref 8.4–10.5)
Chloride: 113 mEq/L — ABNORMAL HIGH (ref 96–112)
Chloride: 111 mEq/L (ref 96–112)
Chloride: 110 mEq/L (ref 96–112)
Creatinine, Ser: 0.68 mg/dL (ref 0.50–1.10)
Creatinine, Ser: 0.6 mg/dL (ref 0.50–1.10)
Creatinine, Ser: 0.57 mg/dL (ref 0.50–1.10)
GFR calc Af Amer: >90 mL/min (ref >90)
GFR calc Af Amer: >90 mL/min (ref >90)
GFR calc non Af Amer: 88 mL/min — ABNORMAL LOW (ref >90)
GFR calc non Af Amer: >90 mL/min (ref >90)
Glucose, Bld: 376 mg/dL — ABNORMAL HIGH (ref 70–99)
Potassium: 2.9 mEq/L — ABNORMAL LOW (ref 3.5–5.1)
Potassium: 4.3 mEq/L (ref 3.5–5.1)
Potassium: 3.6 mEq/L (ref 3.5–5.1)
Sodium: 143 mEq/L (ref 135–145)
Sodium: 144 mEq/L (ref 135–145)
Sodium: 143 mEq/L (ref 135–145)

## 2011-09-27 LAB — POCT I-STAT 3, ART BLOOD GAS (G3+)
Bicarbonate: 15.1 mEq/L — ABNORMAL LOW (ref 20.0–24.0)
Bicarbonate: 16.8 mEq/L — ABNORMAL LOW (ref 20.0–24.0)
Bicarbonate: 24.3 mEq/L — ABNORMAL HIGH (ref 20.0–24.0)
O2 Saturation: 95 %
Patient temperature: 91.8
Patient temperature: 93
Patient temperature: 94.3
Patient temperature: 98.6
TCO2: 16 mmol/L (ref 0–100)
pCO2 arterial: 44.4 mmHg (ref 35.0–45.0)
pCO2 arterial: 39.1 mmHg (ref 35.0–45.0)
pCO2 arterial: 37.2 mmHg (ref 35.0–45.0)
pH, Arterial: 7.236 — ABNORMAL LOW (ref 7.350–7.450)
pH, Arterial: 7.163 — CL (ref 7.350–7.450)
pH, Arterial: 7.251 — ABNORMAL LOW (ref 7.350–7.450)
pH, Arterial: 7.31 — ABNORMAL LOW (ref 7.350–7.450)
pH, Arterial: 7.341 — ABNORMAL LOW (ref 7.350–7.450)
pO2, Arterial: 59 mmHg — ABNORMAL LOW (ref 80.0–100.0)
pO2, Arterial: 53 mmHg — ABNORMAL LOW (ref 80.0–100.0)

## 2011-09-27 LAB — POCT I-STAT, CHEM 8
Chloride: 112 mEq/L (ref 96–112)
Chloride: 112 mEq/L (ref 96–112)
Creatinine, Ser: 0.8 mg/dL (ref 0.50–1.10)
Glucose, Bld: 345 mg/dL — ABNORMAL HIGH (ref 70–99)
HCT: 44 % (ref 36.0–46.0)
HCT: 46 % (ref 36.0–46.0)
HCT: 48 % — ABNORMAL HIGH (ref 36.0–46.0)
Hemoglobin: 16.3 g/dL — ABNORMAL HIGH (ref 12.0–15.0)
Potassium: 2.6 mEq/L — CL (ref 3.5–5.1)
Potassium: 2.7 mEq/L — CL (ref 3.5–5.1)
Potassium: 2.8 mEq/L — ABNORMAL LOW (ref 3.5–5.1)
Sodium: 148 mEq/L — ABNORMAL HIGH (ref 135–145)

## 2011-09-27 LAB — HEPATIC FUNCTION PANEL
ALT: 214 U/L — ABNORMAL HIGH (ref 0–35)
Albumin: 2.9 g/dL — ABNORMAL LOW (ref 3.5–5.2)
Alkaline Phosphatase: 80 U/L (ref 39–117)
Total Protein: 6 g/dL (ref 6.0–8.3)

## 2011-09-27 LAB — PRO B NATRIURETIC PEPTIDE: Pro B Natriuretic peptide (BNP): 544.8 pg/mL — ABNORMAL HIGH (ref 0–125)

## 2011-09-27 LAB — CK TOTAL AND CKMB (NOT AT ARMC): Total CK: 1385 U/L — ABNORMAL HIGH (ref 7–177)

## 2011-09-27 LAB — CARBOXYHEMOGLOBIN: Methemoglobin: 1 % (ref 0.0–1.5)

## 2011-09-27 LAB — PHOSPHORUS: Phosphorus: 2.2 mg/dL — ABNORMAL LOW (ref 2.3–4.6)

## 2011-09-27 MED ORDER — SODIUM BICARBONATE 8.4 % IV SOLN
25.0000 meq | Freq: Once | INTRAVENOUS | Status: AC
  Administered 2011-09-27: 25 meq via INTRAVENOUS

## 2011-09-27 MED ORDER — SODIUM BICARBONATE 8.4 % IV SOLN
INTRAVENOUS | Status: AC
  Administered 2011-09-27: 50 meq
  Filled 2011-09-27: qty 100

## 2011-09-27 MED ORDER — DOBUTAMINE IN D5W 4-5 MG/ML-% IV SOLN
2.5000 ug/kg/min | INTRAVENOUS | Status: DC
  Administered 2011-09-27: 5 ug/kg/min via INTRAVENOUS

## 2011-09-27 MED ORDER — MAGNESIUM SULFATE 40 MG/ML IJ SOLN
2.0000 g | Freq: Once | INTRAMUSCULAR | Status: AC
  Administered 2011-09-27: 2 g via INTRAVENOUS
  Filled 2011-09-27: qty 50

## 2011-09-27 MED ORDER — POTASSIUM CHLORIDE 10 MEQ/100ML IV SOLN
10.0000 meq | INTRAVENOUS | Status: AC
  Administered 2011-09-27 (×4): 10 meq via INTRAVENOUS
  Filled 2011-09-27: qty 400

## 2011-09-27 MED ORDER — SODIUM BICARBONATE 4.2 % IV SOLN
50.0000 meq | Freq: Once | INTRAVENOUS | Status: AC
  Administered 2011-09-27: 50 meq via INTRAVENOUS
  Filled 2011-09-27: qty 100

## 2011-09-27 MED ORDER — DEXTROSE 5 % IV SOLN
2.0000 ug/min | INTRAVENOUS | Status: DC
  Administered 2011-09-27: 40 ug/min via INTRAVENOUS
  Administered 2011-09-27: 30 ug/min via INTRAVENOUS
  Administered 2011-09-28: 20 ug/min via INTRAVENOUS
  Filled 2011-09-27 (×6): qty 16

## 2011-09-27 MED ORDER — POTASSIUM PHOSPHATE DIBASIC 3 MMOLE/ML IV SOLN
30.0000 mmol | Freq: Once | INTRAVENOUS | Status: AC
  Administered 2011-09-27: 30 mmol via INTRAVENOUS
  Filled 2011-09-27: qty 10

## 2011-09-27 MED ORDER — DOBUTAMINE-DEXTROSE 2-5 MG/ML-% IV SOLN
2.5000 ug/kg/min | INTRAVENOUS | Status: DC
  Filled 2011-09-27 (×4): qty 250

## 2011-09-27 MED ORDER — PHENYLEPHRINE HCL 10 MG/ML IJ SOLN
30.0000 ug/min | INTRAVENOUS | Status: DC
  Administered 2011-09-27: 50 ug/min via INTRAVENOUS
  Filled 2011-09-27 (×2): qty 1

## 2011-09-27 MED ORDER — SODIUM CHLORIDE 0.9 % IV SOLN
1.0000 g | Freq: Once | INTRAVENOUS | Status: AC
  Administered 2011-09-27: 1 g via INTRAVENOUS
  Filled 2011-09-27: qty 10

## 2011-09-27 MED ORDER — SODIUM BICARBONATE 8.4 % IV SOLN
50.0000 meq | Freq: Once | INTRAVENOUS | Status: AC
  Administered 2011-09-27: 50 meq via INTRAVENOUS

## 2011-09-27 MED ORDER — VASOPRESSIN 20 UNIT/ML IJ SOLN
0.0300 [IU]/min | INTRAMUSCULAR | Status: DC
  Administered 2011-09-27 – 2011-09-28 (×2): 0.03 [IU]/min via INTRAVENOUS
  Filled 2011-09-27 (×2): qty 2.5

## 2011-09-27 MED ORDER — PHENYLEPHRINE HCL 10 MG/ML IJ SOLN
30.0000 ug/min | INTRAVENOUS | Status: DC
  Administered 2011-09-27: 30 ug/min via INTRAVENOUS
  Filled 2011-09-27: qty 1

## 2011-09-27 MED ORDER — SODIUM CHLORIDE 0.9 % IV BOLUS (SEPSIS)
1000.0000 mL | Freq: Once | INTRAVENOUS | Status: AC
  Administered 2011-09-27: 1000 mL via INTRAVENOUS

## 2011-09-27 MED ORDER — HYDROCORTISONE SOD SUCCINATE 100 MG IJ SOLR
50.0000 mg | Freq: Four times a day (QID) | INTRAMUSCULAR | Status: DC
  Administered 2011-09-27 – 2011-09-29 (×9): 50 mg via INTRAVENOUS
  Filled 2011-09-27 (×13): qty 1

## 2011-09-27 MED ORDER — DOBUTAMINE IN D5W 4-5 MG/ML-% IV SOLN
INTRAVENOUS | Status: AC
  Filled 2011-09-27: qty 250

## 2011-09-27 MED ORDER — SODIUM CHLORIDE 0.9 % IV BOLUS (SEPSIS)
500.0000 mL | Freq: Once | INTRAVENOUS | Status: AC
  Administered 2011-09-27: 500 mL via INTRAVENOUS

## 2011-09-27 MED ORDER — DOBUTAMINE-DEXTROSE 2-5 MG/ML-% IV SOLN
2.5000 ug/kg/min | INTRAVENOUS | Status: DC
  Filled 2011-09-27 (×3): qty 250

## 2011-09-27 MED ORDER — SODIUM CHLORIDE 0.9 % IV BOLUS (SEPSIS): 1000.0000 mL | Freq: Once | INTRAVENOUS | Status: DC

## 2011-09-27 MED ORDER — POTASSIUM CHLORIDE 20 MEQ/15ML (10%) PO LIQD
40.0000 meq | Freq: Once | ORAL | Status: AC
  Administered 2011-09-27: 40 meq
  Filled 2011-09-27: qty 30

## 2011-09-27 MED ORDER — SODIUM BICARBONATE 8.4 % IV SOLN
INTRAVENOUS | Status: DC
  Administered 2011-09-27 (×3): via INTRAVENOUS
  Filled 2011-09-27 (×7): qty 150

## 2011-09-27 MED ORDER — SODIUM BICARBONATE 8.4 % IV SOLN
INTRAVENOUS | Status: AC
  Filled 2011-09-27: qty 50

## 2011-09-27 MED FILL — Medication: Qty: 1 | Status: AC

## 2011-09-27 NOTE — Progress Notes (Signed)
Diabetes Coordinator, Edrick Oh called updated regarding glucostabilizer and pt's blood sugar and insulin levels. Instructed to hold insulin gtt for 30 minutes and restart with original multiplier per protocol at 0.01.

## 2011-09-27 NOTE — Progress Notes (Signed)
CRITICAL VALUE ALERT  Critical value received:  ETT needs to be withdrawn 3-4cm  Date of notification:  09/27/11  Time of notification:  0745  Critical value read back:yes  Nurse who received alert:  Riccardo Dubin RN  MD notified (1st page):  MD at bedside  Time of first page:  0745  MD notified (2nd page):  Time of second page:  Responding MD:  Dr Marin Shutter  Time MD responded:  [phone]

## 2011-09-27 NOTE — Progress Notes (Signed)
eLink Physician-Brief Progress Note Patient Name: Alicia Guerrero DOB: 03-Nov-1935 MRN: [id number]  Date of Service  09/27/2011   HPI/Events of Note     eICU Interventions  Hypocalcemia- repleted Rpt Abg off nimbex to decide need for bicarb   Intervention Category Major Interventions: Electrolyte abnormality - evaluation and management  [name] V. 09/27/2011, 6:51 PM

## 2011-09-27 NOTE — Progress Notes (Signed)
Brett Canales Minor NP notified of 1200 ABG results, order received to increase PEEP to 10, FiO2 to 70% and to recheck ABG in one hr. RT notified.

## 2011-09-27 NOTE — Progress Notes (Signed)
eLink Physician-Brief Progress Note Patient Name: Alicia Guerrero DOB: May 21, 1935 MRN: [id number]  Date of Service  09/27/2011   HPI/Events of Note  Dropping MAPs despite max levophed and hydrocort Persistent lactic acidosis at 5  eICU Interventions  Check abg Check scvo2 Add neo ? Consider dc arctic sun   Intervention Category Major Interventions: Shock - evaluation and management  Aalina Brege 09/27/2011, 6:29 AM

## 2011-09-27 NOTE — Progress Notes (Signed)
Asked to see patient due to increased ventricular ectopy.  Her potassium remains under 3 despite aggressive repletion of potassium and magnesium.  She is mainly in NSR with occasional PVC's and then appears to go into an accelerated idioventricular rhythm which may be due to reperfusion arrhythmias.  Her Troponin has continued to rise which may be due to MI or may be due to defibrillation and chest compressions from her CPR.  Her BP is stable during the episodes of AIVR although requiring levophed.  I think her arrhythmias are primarily due to profound hypokalemia although reperfusion arrhythmias are also possible since some of them look like AIVR.  At this time I  do not think lidocaine is necessary unless she has evidence of more high grade ventricular arrhythmias.  Dr. Dietrich Pates felt that she had possibly had an IWMI based on elevated enzymes and inferior wall motion abnormality on echo but since there was no evidence of RV involvement and EF fairly preserved he felt  that emergent cath was not indicated and would determine how aggressive treatment should be based on neurologic status.

## 2011-09-27 NOTE — Progress Notes (Signed)
eLink Physician-Brief Progress Note Patient Name: Alicia Guerrero DOB: 1935-03-22 MRN: [id number]  Date of Service  09/27/2011   HPI/Events of Note   MAP still in 60s despite fluid bolus and on levopjed  Having NSVTach and bigeminii etc despite kcl ongoing replacement and s/p mag 2gm  Qtc nos 500s    Lab 09/27/11 0002 09/26/11 1808 09/26/11 1524  TROPONINI 6.94* 2.19* <0.30    Lab 09/27/11 0159 09/26/11 2346 09/26/11 2153 09/26/11 1935 09/26/11 1802  K 2.8* 2.6* 2.7* 2.5* 2.5*     eICU Interventions  Check PCT and lactate stat Call cards - will d/w them Add hydrocorotisone (cortisol was 15)    Intervention Category Major Interventions: Hypotension - evaluation and management Intermediate Interventions: Arrhythmia - evaluation and management  [name] 09/27/2011, 3:51 AM

## 2011-09-27 NOTE — Progress Notes (Signed)
CRITICAL VALUE ALERT  Critical value received:  Ca 6.3  Date of notification:  09/27/11  Time of notification:  1845  Critical value read back:yes  Nurse who received alert:  Riccardo Dubin RN  MD notified (1st page):  eLink called  Time of first page:  1847  MD notified (2nd page):  Time of second page:  Responding MD:  Dr Vassie Loll  Time MD responded:  [phone]  Orders received

## 2011-09-27 NOTE — H&P (Signed)
Name: Alicia Guerrero MRN: [id number] DOB: Dec 13, 1935    LOS: 1  Referring Provider:  [facility] Reason for Referral:  Cardiac arrest  PULMONARY / CRITICAL CARE MEDICINE  Patient summary:  76 yo with extensive cardiac history brought to [facility] ED after VT/VF arrest.  Transferred to [facility] for hypothermia protocol.  Events Since Admission: 9/10  VT/VF arrest, intubated, hypothermia initiated 9/11  Hypothermia stopped as multiple arrhythmias and coagulopathy  Interval history:  Arrhythmias overnight.  Coagulopathic.  Hypothermia protocol stopped by eMD.  Vital Signs: Temp:  [90.7 F (32.6 C)-98.2 F (36.8 C)] 90.7 F (32.6 C) (09/11 0600) Pulse Rate:  [57-95] 75  (09/11 0600) Resp:  [14-26] 20  (09/11 0600) BP: (89-182)/(47-144) 107/60 mmHg (09/11 0600) SpO2:  [95 %-100 %] 97 % (09/11 0600) FiO2 (%):  [80 %-100 %] 80 % (09/11 0600) Weight:  [83.9 kg (184 lb 15.5 oz)-86 kg (189 lb 9.5 oz)] 86 kg (189 lb 9.5 oz) (09/11 0236)  Physical Examination: General:  Intubated, mechanically ventilated Neuro:  Sedated, synchronous HEENT: PERRL Neck:  No JVD Cardiovascular:  RRR, no murmurs Lungs:  Bilateral air entry, no w/r/r Abdomen:  Soft, bowel sounds present Musculoskeletal:  No edema Skin:  Intact.  Active Problems:  HYPERTENSION  CORONARY ARTERY DISEASE  Cardiac arrest - ventricular fibrillation  Acute respiratory failure  Shock  Altered mental status  Acute non-ST segment elevation myocardial infarction   ASSESSMENT AND PLAN  PULMONARY  Lab 09/27/11 0644 09/27/11 0641 09/26/11 1840 09/26/11 1525  PHART -- 7.178* 7.318* 7.170*  PCO2ART -- 39.4 47.6* 60.0*  PO2ART -- 68.4* 92.0 140.0*  HCO3 -- 15.1* 24.9* 21.0  O2SAT 68.8 93.9 97.0 98.6   Ventilator Settings: Vent Mode:  [-] PRVC FiO2 (%):  [80 %-100 %] 80 % Set Rate:  [15 bmp-20 bmp] 20 bmp Vt Set:  [440 mL-500 mL] 440 mL PEEP:  [5 cmH20] 5 cmH20 Plateau Pressure:  [20 cmH20-24 cmH20] 21 cmH20  CXR:  9/11 >>> Low ETT,  bilateral airspace disease, small left effusion ETT:  9/10>>>  A:  Acute respiratory failure post cardiac arrest. P:   Goal pH > 7.30, SpO2> 92 Full mechanical support Increase RR 30, PEEP 8 ABG 1 hour  CARDIOVASCULAR  Lab 09/27/11 0556 09/27/11 0349 09/27/11 0348 09/27/11 0002 09/26/11 1935 09/26/11 1808 09/26/11 1524  TROPONINI 5.77* -- -- 6.94* -- 2.19* <0.30  LATICACIDVEN -- -- 5.5* -- 5.1* -- --  PROBNP -- 544.8* -- -- -- 116.0 --   ECG:  9/11 >>> LBBB Lines: L IJ TLC 9/10 >>  A: VT/VF arrest. rrhythmias.  Shock, likely cardiogenic. P:  Amiodarone Heparin contraindicated (coagulopathy) Beta blocker / ACE contraindicated (shock) ASA / Statin Discussed with Interventional Cardiology, will assess for urgent Cardiac Cath D/c Neo-Synephrine as increases afterload and likely not to add benefit while on max Levophed Start Dobutamine  RENAL  Lab 09/27/11 0600 09/27/11 0159 09/26/11 2346 09/26/11 2153 09/26/11 1935 09/26/11 1802 09/26/11 1524  NA 142 147* 147* 148* 143 -- --  K 3.1* 2.8* -- -- -- -- --  CL 113* 112 112 110 104 -- --  CO2 15* -- -- -- 23 23 23   BUN 16 14 14 15 18  -- --  CREATININE 0.68 0.80 0.80 0.80 0.85 -- --  CALCIUM 8.4 -- -- -- 9.2 9.6 11.7*  MG 1.9 -- -- -- -- 1.9 2.6*  PHOS 0.6* -- -- -- -- 3.8 7.2*   Intake/Output      09/10  0701 - 09/11 0700 09/11 0701 - 09/12 0700   I.V. (mL/kg) 3816.4 (44.4)    IV Piggyback 980    Total Intake(mL/kg) 4796.4 (55.8)    Urine (mL/kg/hr) 3555 (1.7)    Total Output 3555    Net +1241.4         Stool Occurrence 1 x     Foley:  In place.  A:  Hypokalemia.  Hypophosphatemia.  Hypomagnesemia.  Metabolic acidosis. P:   Replete electrolytes Trend BMP Bicarbonate gtt  GASTROINTESTINAL  Lab 09/27/11 0349 09/26/11 1802  AST 253* 293*  ALT 214* 281*  ALKPHOS 80 111  BILITOT 0.8 0.9  PROT 6.0 6.9  ALBUMIN 2.9* 3.6   A:  Shocked liver. P:   NPO as intubated Trend LFTs  HEMATOLOGIC  Lab 09/27/11 0600  09/27/11 0205 09/27/11 0159 09/26/11 2346 09/26/11 2153 09/26/11 1802 09/26/11 1524  HGB 14.4 -- 15.0 15.6* 16.3* 14.0 --  HCT 43.6 -- 44.0 46.0 48.0* 43.0 --  PLT 318 -- -- -- -- 277 204  INR -- 3.53* -- -- -- 2.96* 2.71*  APTT -- 59* -- -- -- 39* --   A:  Coagulopathy (Coumadin induced, exacerbated by hypothermia).  No overt hemorrhage. Trend CBC, INR  INFECTIOUS  Lab 09/27/11 0600 09/26/11 1802 09/26/11 1524  WBC 18.7* 24.4* 9.3  PROCALCITON -- -- --   Cultures: NA Antibiotics: NA  A:  No active issues. P:   Monitor fever curve and WBC.  ENDOCRINE  Lab 09/27/11 0157 09/27/11 0112 09/27/11 0008 09/26/11 2306 09/26/11 2153  GLUCAP 328* 245* 276* 272* 336*   A:  Hyperglycemia. P:   Insulin gtt  NEUROLOGIC  A:  Acute encephalopathy. P:   Wean sedation after Hypothermia completed  The patient is critically ill with multiple organ systems failure and requires high complexity decision making for assessment and support, frequent evaluation and titration of therapies, application of advanced monitoring technologies and extensive interpretation of multiple databases. Critical Care Time devoted to patient care services described in this note is 60 minutes.  Lonia Farber, M.D. Pulmonary and Critical Care Medicine [facility] Pager: [phone]  09/27/2011, 7:29 AM

## 2011-09-27 NOTE — Progress Notes (Signed)
ABG results called to Brett Canales Minor NP. Orders received.

## 2011-09-27 NOTE — Care Management Note (Signed)
    Page 1 of 1   09/27/2011     8:39:07 AM   CARE MANAGEMENT NOTE 09/27/2011  Patient:  Alicia Guerrero, Alicia Guerrero   Account Number:  [id number]  Date Initiated:  09/27/2011  Documentation initiated by:  Junius Creamer  Subjective/Objective Assessment:   adm w cardiac arrest     Action/Plan:   lives w husband,pcp dr Casimiro Needle norins   Anticipated DC Date:     Anticipated DC Plan:        DC Planning Services  CM consult      Choice offered to / List presented to:             Status of service:   Medicare Important Message given?   (If response is "NO", the following Medicare IM given date fields will be blank) Date Medicare IM given:   Date Additional Medicare IM given:    Discharge Disposition:    Per UR Regulation:  Reviewed for med. necessity/level of care/duration of stay  If discussed at Long Length of Stay Meetings, dates discussed:    Comments:  9/11 8:38a debbie Tavian Callander rn,bsn [phone]

## 2011-09-27 NOTE — Consult Note (Signed)
ELECTROPHYSIOLOGY CONSULT NOTE  Patient ID: Alicia Guerrero MRN: [id number], DOB/AGE: [birth year]   Admit date: 09/26/2011 Date of Consult: 09/27/2011  Primary Physician: Illene Regulus, MD Primary Cardiologist: Valera Castle, MD Reason for Consultation: VF arrest  History of Present Illness Alicia Guerrero is a 76 year old woman with CAD s/p PCI, most recent April 2012 (PCI to RCA), prior normal LV function, LBBB and paroxysmal AFib who has suffered a witnessed out-of-hospital arrest. According to her admission H&P, she had VT arrest while waiting for her niece to finish a driving test at the [facility] office. It is unclear if there was any prodrome of chest pain or SOB prior to her LOC. Resuscitation in the field was apparently unsuccessful and immediately prior to arrival in the ED, she was noted to have PEA. According to EMS reports, she received multiple defibrillation attempts as well as treatment with amiodarone. She was evaluated at [facility] ED then transferred to [facility]. On arrival to [facility], she was sedated and intubated. The [place] cooling protocol was initiated. She was noted to be in sinus rhythm with frequent PVCs. She continued to have frequent ventricular ectopy and cooling was discontinued after approximately 8 hours. She is in cardiogenic shock requiring multiple pressors. She had profound hypokalemia on admission with a potassium level of 2.1. Her first troponin was negative; however, her troponin is now positive at 6.28.     Detailed Past Cardiac History 1. Non-ST-elevation myocardial infarction April 2012 with troponin of 5.10.  a. Status post catheterization at that time with successful complex percutaneous coronary intervention to the distal right coronary artery with drug-eluting stent on May 09, 2010.  b. Patent left anterior descending stent with moderately severe distal vessel disease, small  Caliber c. Patent circumflex.  d. Moderate segmental left ventricular dysfunction with  ejection fraction of 40%.  2. Status post percutaneous transluminal coronary  angioplasty/stent to the right coronary artery in 1996.  3. Status post percutaneous transluminal coronary angioplasty and cutting balloon to the left anterior descending in 2001.  4. Status post percutaneous transluminal coronary angioplasty and Cypher drug-eluting stent placed to the left anterior descending in 2003.  Past Medical History Past Medical History  Diagnosis Date  . Left bundle branch block   . Atrial fibrillation   . Acute myocardial infarction, unspecified site, episode of care unspecified   . Coronary atherosclerosis   . Pure hypercholesterolemia   . Unspecified essential hypertension   . Cough   . Other atopic dermatitis and related conditions   . Acute upper respiratory infections of unspecified site   . Allergic rhinitis due to pollen   . Unspecified asthma   . Other and unspecified ovarian cyst   . Personal history of malignant neoplasm of breast   . Personal history of hyperthyroidism   . Chronic eczema     hands  . Hx of coronary angioplasty     percutaneous transluminal  . Hemorrhoids   . Cancer     Past Surgical History Past Surgical History  Procedure Date  . Oophorectomy   . Abdominal hysterectomy   . Ptca   . Cholecystectomy   . Coronary stent placement may and  july 2012  . Breast lumpectomy 08/24/2003    right lumpectomy+sln,T2N0,ERPR+,Her2-  . Polypectomy   . Colonoscopy      Allergies/Intolerances Allergies  Allergen Reactions  . Meperidine Hcl Other (See Comments)    Mixed with phenergan swelling of the mouth   . Penicillins Other (  See Comments)    Unknown reaction  . Molds & Smuts Other (See Comments)    Runny nose  . Tape Other (See Comments)    Redness, pulls skin off    Inpatient Medications . sodium chloride  2,000 mL Intravenous Once  . antiseptic oral rinse  15 mL Mouth Rinse Q4H  . artificial tears  1 application Both Eyes Q8H  . aspirin   300 mg Rectal NOW  . chlorhexidine  15 mL Mouth/Throat BID  . cisatracurium  0.1 mg/kg Intravenous Once  . DOBUTamine      . etomidate      . fentaNYL  100 mcg Intravenous Once  . hydrocortisone sod succinate (SOLU-CORTEF) injection  50 mg Intravenous Q6H  . lidocaine (cardiac) 100 mg/67ml      . LORazepam  2 mg Intravenous Once  . magnesium sulfate 1 - 4 g bolus IVPB  2 g Intravenous Once  . magnesium sulfate 1 - 4 g bolus IVPB  2 g Intravenous Once  . pantoprazole (PROTONIX) IV  40 mg Intravenous Q24H  . potassium chloride  10 mEq Intravenous Q1 Hr x 6  . potassium chloride  10 mEq Intravenous Q1 Hr x 4  . potassium chloride  10 mEq Intravenous Q1 Hr x 6  . potassium chloride  40 mEq Per Tube Once  . potassium chloride  40 mEq Per Tube Once  . potassium chloride  40 mEq Per Tube Once  . potassium phosphate IVPB (mmol)  30 mmol Intravenous Once  . propofol  5-70 mcg/kg/min (Order-Specific) Intravenous Once  . rocuronium      . sodium bicarbonate      . sodium bicarbonate      . sodium bicarbonate  50 mEq Intravenous Once  . sodium bicarbonate  25 mEq Intravenous Once  . sodium chloride  1,000 mL Intravenous Once  . sodium chloride  1,000 mL Intravenous Once  . sodium chloride  1,000 mL Intravenous Once  . sodium chloride  500 mL Intravenous Once  . sodium chloride  500 mL Intravenous Once  . DISCONTD: magnesium sulfate LVP 250-500 ml  2 g Intravenous Once  . DISCONTD: potassium chloride  10 mEq Intravenous Q1 Hr x 3  . DISCONTD: propofol  5-70 mcg/kg/min Intravenous Once  . DISCONTD: sodium chloride  1,000 mL Intravenous Once  . DISCONTD: succinylcholine          . cisatracurium (NIMBEX) infusion 1 mcg/kg/min (09/26/11 1900)  . fentaNYL infusion INTRAVENOUS 100 mcg/hr (09/26/11 1900)  . insulin (NOVOLIN-R) infusion 19.9 Units/hr (09/27/11 0647)  . midazolam (VERSED) infusion 2 mg/hr (09/27/11 0900)  . norepinephrine (LEVOPHED) Adult infusion 80 mcg/min (09/27/11 0910)  .  phenylephrine (NEO-SYNEPHRINE) Adult infusion 75 mcg/min (09/27/11 1030)  .  sodium bicarbonate infusion 1000 mL 150 mL/hr at 09/27/11 0936  . vasopressin (PITRESSIN) infusion - *FOR SHOCK* 0.03 Units/min (09/27/11 0957)  . DISCONTD: DOButamine    . DISCONTD: DOButamine    . DISCONTD: DOBUTamine 5 mcg/kg/min (09/27/11 0907)  . DISCONTD: norepinephrine (LEVOPHED) Adult infusion 30 mcg/min (09/27/11 0100)  . DISCONTD: phenylephrine (NEO-SYNEPHRINE) Adult infusion 50 mcg/min (09/27/11 0700)  . DISCONTD: propofol      Family History Family History  Problem Relation Age of Onset  . Uterine cancer Mother   . Hypertension Mother   . Coronary artery disease Father   . Heart disease Sister   . Heart disease Brother   . Endometrial cancer Sister   . Heart disease Brother   .  Clotting disorder Brother     blood clot  . Colon cancer Neg Hx      Social History History   Social History  . Marital Status: Married    Spouse Name: N/A    Number of Children: 2  . Years of Education: N/A   Occupational History  . Retired     Social History Main Topics  . Smoking status: Never Smoker   . Smokeless tobacco: Never Used  . Alcohol Use: No  . Drug Use: No  . Sexually Active: Not on file   Other Topics Concern  . Not on file   Social History Narrative   0 caffeine drinks daily      Review of Systems General:  No chills, fever, night sweats or weight changes  Cardiovascular:  No chest pain, dyspnea on exertion, edema, orthopnea, palpitations, paroxysmal nocturnal dyspnea Dermatological: No rash, lesions or masses Respiratory: No cough, dyspnea Urologic: No hematuria, dysuria Abdominal:   No nausea, vomiting, diarrhea, bright red blood per rectum, melena, or hematemesis Neurologic:  No visual changes, weakness, changes in mental status All other systems reviewed and are otherwise negative except as noted above.  Physical Exam Blood pressure 121/44, pulse 80, temperature 91.6 F  (33.1 C), temperature source Core (Comment), resp. rate 22, height 5' 3.5" (1.613 m), weight 189 lb 9.5 oz (86 kg), SpO2 98.00%.  General: Ill appearing 76 year old female who is sedated and intubated. HEENT: Normocephalic, atraumatic. Sclera nonicteric. ET tube in place.  Neck: Supple. No JVD. Lungs: Mechanically ventilated. CTA bilaterally anteriorly. No wheezes, rales or rhonchi. Heart: RRR. S1, S2 present. No murmurs, rub, S3 or S4. Abdomen: Soft, non-distended.  Extremities: No clubbing, cyanosis or edema.  Neuro: Deferred. Patient is intubated and sedated. Musculoskeletal: No kyphosis. Skin: Intact. Warm and dry. No rashes or petechiae in exposed areas.   Labs  Basename 09/27/11 0556 09/27/11 0002 09/26/11 1808 09/26/11 1524  CKTOTAL 1385* -- -- --  CKMB 60.6* -- -- --  TROPONINI 5.77* 6.94* 2.19* <0.30   Lab Results  Component Value Date   WBC 18.7* 09/27/2011   HGB 14.4 09/27/2011   HCT 43.6 09/27/2011   MCV 81.2 09/27/2011   PLT 318 09/27/2011    Lab 09/27/11 0600 09/27/11 0349  NA 142 --  K 3.1* --  CL 113* --  CO2 15* --  BUN 16 --  CREATININE 0.68 --  CALCIUM 8.4 --  PROT -- 6.0  BILITOT -- 0.8  ALKPHOS -- 80  ALT -- 214*  AST -- 253*  GLUCOSE 294* --   No components found with this basename: MAGNESIUM No components found with this basename: POCBNP:3   Basename 09/27/11 0205  INR 3.53*    Radiology/Studies Ct Head Wo Contrast  09/26/2011  *RADIOLOGY REPORT*  Clinical Data: The outpatient.  CT HEAD WITHOUT CONTRAST  Technique:  Contiguous axial images were obtained from the base of the skull through the vertex without contrast.  Comparison: None.  Findings: The ventricles are normal in size for this patient's age and are normal in configuration.  There are no parenchymal masses or mass effect.  There is a small area of well defined hypoattenuation in the left lateral frontal lobe likely an area of previous infarction.  Patchy areas of white matter  hypoattenuation are noted bilaterally most consistent with moderate chronic microvascular ischemic change.  There is no convincing recent transcortical infarct.  There are no extra-axial masses or abnormal fluid collections. There is no intracranial  hemorrhage.  The visualized left maxillary sinus is mostly opacified.  The remaining visualized sinuses are essentially clear as are the mastoid air cells.  No skull lesion.  IMPRESSION: No acute intracranial abnormalities.  Age related volume loss, evidence of a small old left frontal infarct and moderate chronic microvascular ischemic change.   Original Report Authenticated By: Domenic Moras, M.D.    Dg Chest Port 1 View  09/27/2011  *RADIOLOGY REPORT*  Clinical Data: Intubated patient.  PORTABLE CHEST - 1 VIEW  Comparison: Chest x-ray 09/26/2011.  Findings: The tip of the endotracheal tube is at the level of the carina and should be withdrawn approximately 3-4 cm for more optimal placement. There is a left-sided internal jugular central venous catheter with tip terminating in the distal superior vena cava. A nasogastric tube is seen extending into the stomach, however, the tip of the nasogastric tube extends below the lower margin of the image.  Lung volumes remain low. There is cephalization of the pulmonary vasculature and slight indistinctness of the interstitial markings suggestive of mild pulmonary edema.  Overall, aeration continues to improve.  Small left pleural effusion.  Mild cardiomegaly.  Atherosclerosis of the thoracic aorta.  IMPRESSION: 1.  Support apparatus, as above.  Please take note of the low position of the endotracheal tube, tip of which is at the carina. The tube should be withdrawn 3-4 cm for more optimal placement. 2.  Persistent mild pulmonary edema, with improved aeration overall. 3.  Small left pleural effusion.  These results will be called to the ordering clinician or representative by the Radiologist Assistant, and communication  documented in the PACS Dashboard.   Original Report Authenticated By: Florencia Reasons, M.D.    Dg Chest Port 1 View  09/26/2011  *RADIOLOGY REPORT*  Clinical Data: Post procedure.  PORTABLE CHEST - 1 VIEW  Comparison: Earlier today at 1609 hours.  Findings: Improved quality exam with mild residual degradation. Endotracheal tube 2.2 cm above carina.  Nasogastric tube extends beyond the  inferior aspect of the film. Left IJ central line tip at mid SVC.  Normal heart size with atherosclerosis in the transverse aorta. No pleural effusion or pneumothorax.  Upper lobe predominant airspace disease is likely slightly improved. No free intraperitoneal air.  IMPRESSION:  1.  Improved aeration with decreased airspace disease.  Favor pulmonary edema.  Multifocal infection or aspiration could look similar. 2.  Appropriate position of support apparatus, without pneumothorax.   Original Report Authenticated By: Consuello Bossier, M.D.    Dg Chest Portable 1 View  09/26/2011  *RADIOLOGY REPORT*  Clinical Data: Intubated.  PORTABLE CHEST - 1 VIEW  Comparison: 08/14/2010  Findings: Leanor Kail is difficult to visualize.  Endotracheal tube is likely approximately 3-4 cm above the carina.  Cardiomegaly.  NG tube is in the stomach.  Suspect bilateral perihilar and upper lobe airspace opacities.  IMPRESSION: Endotracheal tube approximately 304 cm above the carina.  Suspect bilateral perihilar and upper lobe airspace opacities.   Original Report Authenticated By: Cyndie Chime, M.D.     Echocardiogram  - Left ventricle: The cavity size was normal. Mild septal hypertrophy, most prominent at the base. Systolic function was mildly to moderately reduced. The estimated ejection fraction was in the range of 40% to 45%. Severe hypokinesis to akinesis of the inferior myocardium consistent with infarction. - Ventricular septum: Abnormal septal motionsecondary to LBBB. - Mitral valve: Calcified annulus. - Atrial septum: No defect or patent  foramen ovale was identified. - Pulmonary arteries:  PA peak pressure: 42mm Hg (S).  12-lead ECG Initial 12 1519h  AIVR with complete heart block #2 1812 h SR with LBBB #3 0541 h SR with intevals 21/116/39 with poor r wave progression and minor ST changes but no STE; a 12 lead 2 min before showed  AIVR  #4 1457 hr  SR 17/13/43  Telemetry  freq ventricular ectopy  Assessment and Plan  Patient Active Hospital Problem List: HYPERTENSION (01/01/2007)   CORONARY ARTERY DISEASE (01/01/2007)   Cardiac arrest - ventricular fibrillation (09/26/2011)   Acute respiratory failure (09/26/2011)   Shock (09/26/2011)   Altered mental status (09/26/2011)   Acute non-ST segment elevation myocardial infarction     A catastrophic event yesterday assoc with cardiac arrest with prolonged resuscitation.  + Tn are hard to interpret in the setting of arrest as is the low potassium esp in the setting of prolonged resuscitation using adrenergic agents, although she has had documented hypokalemia in the past, most recent was 4.0 on July 17,2013  The echo showed a new WMA last night, but by the time we saw here this am at 0900 she is well past the 12 hr window to justify emergent cath, esp in the absence of STE  The initial event could possibly have been IMI as suggested by the echo; her LVEF was previously not so bad that one would suspect reeentrant VT  The complex ectopy of the last 16 hrs may well be aggravated by hypokalemia, which is likely 2/2 prolonged resusciation, high dose stimulants required for pressors and ischemia/reperfusion  For now, in the absence of being sustained, I would not utilize antiarrhythmic therapy  ECG shows no  R waves which is new since last pm;  Will repeat echo   Will discuss with CCM anticoagulation/ antiplatlet for protection against a possible ischemic component          Signed, Rick Duff, PA-C 09/27/2011, 10:59 AM  Sherryl Manges, MD 09/27/2011 3:36  PM

## 2011-09-27 NOTE — Progress Notes (Signed)
eLink Physician-Brief Progress Note Patient Name: Alicia Guerrero DOB: Jun 26, 1935 MRN: [id number]  Date of Service  09/27/2011   HPI/Events of Note   Shock + severe acidosis + unchanged lactic acidosis  .   Lab 09/27/11 0644 09/27/11 0641 09/27/11 0159 09/26/11 2346 09/26/11 2153 09/26/11 1840 09/26/11 1525  PHART -- 7.178* -- -- -- 7.318* 7.170*  PCO2ART -- 39.4 -- -- -- 47.6* 60.0*  PO2ART -- 68.4* -- -- -- 92.0 140.0*  HCO3 -- 15.1* -- -- -- 24.9* 21.0  TCO2 -- 16.6 17 18 20 26  --  O2SAT 68.8 93.9 -- -- -- 97.0 98.6     eICU Interventions  Dc arctic sun Bicarb stat   Intervention Category Major Interventions: Acid-Base disturbance - evaluation and management;Shock - evaluation and management  Alicia Guerrero 09/27/2011, 6:58 AM

## 2011-09-27 NOTE — Progress Notes (Signed)
eLink Physician-Brief Progress Note Patient Name: Alicia Guerrero DOB: [birth year] MRN: [id number]  Date of Service  09/27/2011   HPI/Events of Note  cvp 10. MAP 67 despite levoped 30   eICU Interventions  500cc bolus   Intervention Category Major Interventions: Hypotension - evaluation and management  Fionna Merriott 09/27/2011, 12:13 AM

## 2011-09-27 NOTE — Progress Notes (Signed)
CRITICAL VALUE ALERT  Critical value received:  Phos 0.6  Date of notification:  09/27/11  Time of notification:  0730 Critical value read back:yes  Nurse who received alert:  Josh Charmion Hapke rn  MD notified (1st page):  Dr Marin Shutter  Time of first page:  0730  MD notified (2nd page):  Time of second page:  Responding MD:  Dr Marin Shutter  Time MD responded:  md ate bedside

## 2011-09-27 NOTE — Progress Notes (Signed)
Unit Nurse requested Chaplain support to be with pt.'s husband who is here alone in waiting area. Husband experencing high levels of anxiety, tearful and afraid of possible lost.  Pt. is scheduled for another surgery per husband.  Husband reflecting on recent 52 yrs. Anniversary  celebration and multiple personal concerns. Sons were here earlier and will return later in pm.  Will pass on to unit Chaplain to follow as needed.   09/27/11 1000  Clinical Encounter Type  Visited With Patient and family together;Health care provider  Visit Type Spiritual support;Pre-op  Referral From Nurse  Consult/Referral To Chaplain  Spiritual Encounters  Spiritual Needs Prayer;Emotional  Stress Factors  Family Stress Factors Exhausted;Major life changes

## 2011-09-27 NOTE — Progress Notes (Signed)
INITIAL ADULT NUTRITION ASSESSMENT Date: 09/27/2011   Time: 9:23 AM Reason for Assessment: Vent   INTERVENTION: 1. Recommend if to remains intubated post rewarming, initiate Promote via OG tube at 30 ml/hr. Also 30 ml Pro-stat via tube 4 times daily. This EN regimen will provide 1120 kcal (70%) and 105 gm protein (98%), and 604 ml free water.  2. Recommend daily multivitamin.  3. Will require additional free water if no IV fluids 4. RD will continue to follow    DOCUMENTATION CODES Per approved criteria  -Obesity Unspecified    ASSESSMENT: Female 76 y.o.  Dx: Cardiac Arrest   Hx:  Past Medical History  Diagnosis Date  . Left bundle branch block   . Atrial fibrillation   . Acute myocardial infarction, unspecified site, episode of care unspecified   . Coronary atherosclerosis   . Pure hypercholesterolemia   . Unspecified essential hypertension   . Cough   . Other atopic dermatitis and related conditions   . Acute upper respiratory infections of unspecified site   . Allergic rhinitis due to pollen   . Unspecified asthma   . Other and unspecified ovarian cyst   . Personal history of malignant neoplasm of breast   . Personal history of hyperthyroidism   . Chronic eczema     hands  . Hx of coronary angioplasty     percutaneous transluminal  . Hemorrhoids   . Cancer     Related Meds:     . sodium chloride  2,000 mL Intravenous Once  . antiseptic oral rinse  15 mL Mouth Rinse Q4H  . artificial tears  1 application Both Eyes Q8H  . aspirin  300 mg Rectal NOW  . chlorhexidine  15 mL Mouth/Throat BID  . cisatracurium  0.1 mg/kg Intravenous Once  . DOBUTamine      . etomidate      . fentaNYL  100 mcg Intravenous Once  . hydrocortisone sod succinate (SOLU-CORTEF) injection  50 mg Intravenous Q6H  . lidocaine (cardiac) 100 mg/14ml      . LORazepam  2 mg Intravenous Once  . magnesium sulfate 1 - 4 g bolus IVPB  2 g Intravenous Once  . magnesium sulfate 1 - 4 g bolus IVPB   2 g Intravenous Once  . pantoprazole (PROTONIX) IV  40 mg Intravenous Q24H  . potassium chloride  10 mEq Intravenous Q1 Hr x 6  . potassium chloride  10 mEq Intravenous Q1 Hr x 4  . potassium chloride  10 mEq Intravenous Q1 Hr x 6  . potassium chloride  40 mEq Per Tube Once  . potassium chloride  40 mEq Per Tube Once  . potassium chloride  40 mEq Per Tube Once  . potassium phosphate IVPB (mmol)  30 mmol Intravenous Once  . propofol  5-70 mcg/kg/min (Order-Specific) Intravenous Once  . rocuronium      . sodium bicarbonate      . sodium bicarbonate      . sodium bicarbonate  25 mEq Intravenous Once  . sodium chloride  1,000 mL Intravenous Once  . sodium chloride  1,000 mL Intravenous Once  . sodium chloride  500 mL Intravenous Once  . DISCONTD: magnesium sulfate LVP 250-500 ml  2 g Intravenous Once  . DISCONTD: potassium chloride  10 mEq Intravenous Q1 Hr x 3  . DISCONTD: propofol  5-70 mcg/kg/min Intravenous Once  . DISCONTD: sodium chloride  1,000 mL Intravenous Once  . DISCONTD: succinylcholine  Ht: 5' 3.5" (161.3 cm)  Wt: 189 lb 9.5 oz (86 kg)  Ideal Wt: 53.4 kg  % Ideal Wt: 161%  Usual Wt:  Wt Readings from Last 5 Encounters:  09/27/11 189 lb 9.5 oz (86 kg)  09/13/11 175 lb 12.8 oz (79.742 kg)  08/08/11 174 lb 14.4 oz (79.334 kg)  06/14/11 178 lb (80.74 kg)  05/17/11 177 lb (80.287 kg)    % Usual Wt: 108%  Body mass index is 33.06 kg/(m^2). Pt is obese class 1 per current BMI   Food/Nutrition Related Hx: Pt has not had recent weight loss or poor appetite per MST (Malnutrition Screening Tool)  Labs:  CMP     Component Value Date/Time   NA 142 09/27/2011 0600   K 3.1* 09/27/2011 0600   CL 113* 09/27/2011 0600   CO2 15* 09/27/2011 0600   GLUCOSE 294* 09/27/2011 0600   BUN 16 09/27/2011 0600   CREATININE 0.68 09/27/2011 0600   CALCIUM 8.4 09/27/2011 0600   PROT 6.0 09/27/2011 0349   ALBUMIN 2.9* 09/27/2011 0349   AST 253* 09/27/2011 0349   ALT 214* 09/27/2011  0349   ALKPHOS 80 09/27/2011 0349   BILITOT 0.8 09/27/2011 0349   GFRNONAA 84* 09/27/2011 0600   GFRAA >90 09/27/2011 0600    Intake/Output Summary (Last 24 hours) at 09/27/11 0926 Last data filed at 09/27/11 0910  Gross per 24 hour  Intake 6019.1 ml  Output   3805 ml  Net 2214.1 ml     Diet Order: NPO  Supplements/Tube Feeding: none   IVF:    cisatracurium (NIMBEX) infusion Last Rate: 1 mcg/kg/min (09/26/11 1900)  DOBUTamine Last Rate: 30 mcg/kg/min (09/27/11 0905)  fentaNYL infusion INTRAVENOUS Last Rate: 100 mcg/hr (09/26/11 1900)  insulin (NOVOLIN-R) infusion Last Rate: 19.9 Units/hr (09/27/11 0647)  midazolam (VERSED) infusion Last Rate: 2 mg/hr (09/27/11 0900)  norepinephrine (LEVOPHED) Adult infusion Last Rate: 80 mcg/min (09/27/11 0910)  sodium bicarbonate infusion 1000 mL Last Rate: 100 mL/hr at 09/27/11 0731  DISCONTD: DOButamine   DISCONTD: DOButamine   DISCONTD: norepinephrine (LEVOPHED) Adult infusion Last Rate: 30 mcg/min (09/27/11 0100)  DISCONTD: phenylephrine (NEO-SYNEPHRINE) Adult infusion Last Rate: 50 mcg/min (09/27/11 0700)  DISCONTD: propofol     MV: 8.4 Temp:Temp (24hrs), Avg:94 F (34.4 C), Min:90.7 F (32.6 C), Max:98.2 F (36.8 C)  Propofol: d/c'd   Estimated Nutritional Needs:   Kcal: 1594  (underfeeding goal: [phone] kcal) Protein: 107-120 gm  Fluid:  1.2 L   Pt with witnessed cardiac arrest. Intubated, sedated and on hypothermia protocol. Pt with hypokalemia at admission and hyperglycemia.  Pt meets criteria for permissive underfeeding if EN is initiated.  Recommend initiation of EN once pt is rewarmed, if remains intubated.   NUTRITION DIAGNOSIS: -Inadequate oral intake (NI-2.1).  Status: Ongoing  RELATED TO: inability to eat  AS EVIDENCE BY: mechanically ventilated  MONITORING/EVALUATION(Goals): Goal: Enteral nutrition to provide 60-70% of estimated calorie needs (22-25 kcals/kg ideal body weight) and >/= 90% of estimated  protein needs, based on ASPEN guidelines for permissive underfeeding in critically ill obese individuals. Monitor: vent status, initiation of EN, weight, labs  EDUCATION NEEDS: -No education needs identified at this time    Clarene Duke RD, LDN Pager [phone] After Hours pager [phone]  09/27/2011, 9:23 AM

## 2011-09-28 ENCOUNTER — Inpatient Hospital Stay (HOSPITAL_COMMUNITY): Payer: Medicare Other

## 2011-09-28 LAB — GLUCOSE, CAPILLARY
Glucose-Capillary: 114 mg/dL — ABNORMAL HIGH (ref 70–99)
Glucose-Capillary: 128 mg/dL — ABNORMAL HIGH (ref 70–99)
Glucose-Capillary: 128 mg/dL — ABNORMAL HIGH (ref 70–99)
Glucose-Capillary: 131 mg/dL — ABNORMAL HIGH (ref 70–99)
Glucose-Capillary: 131 mg/dL — ABNORMAL HIGH (ref 70–99)
Glucose-Capillary: 131 mg/dL — ABNORMAL HIGH (ref 70–99)
Glucose-Capillary: 133 mg/dL — ABNORMAL HIGH (ref 70–99)

## 2011-09-28 LAB — URINE CULTURE
Colony Count: NO GROWTH
Culture: NO GROWTH

## 2011-09-28 LAB — POCT I-STAT 3, ART BLOOD GAS (G3+)
Acid-Base Excess: 2 mmol/L (ref 0.0–2.0)
O2 Saturation: 94 %
Patient temperature: 100

## 2011-09-28 LAB — PHOSPHORUS: Phosphorus: 2.4 mg/dL (ref 2.3–4.6)

## 2011-09-28 LAB — PROTIME-INR
INR: 1.26 (ref 0.00–1.49)
Prothrombin Time: 16.1 seconds — ABNORMAL HIGH (ref 11.6–15.2)

## 2011-09-28 LAB — CBC
Hemoglobin: 12.2 g/dL (ref 12.0–15.0)
MCH: 26.5 pg (ref 26.0–34.0)
MCHC: 34 g/dL (ref 30.0–36.0)
Platelets: 243 10*3/uL (ref 150–400)
RDW: 14.1 % (ref 11.5–15.5)

## 2011-09-28 LAB — BASIC METABOLIC PANEL
Calcium: 7.3 mg/dL — ABNORMAL LOW (ref 8.4–10.5)
GFR calc Af Amer: >90 mL/min (ref >90)
GFR calc non Af Amer: 80 mL/min — ABNORMAL LOW (ref >90)
Sodium: 143 mEq/L (ref 135–145)

## 2011-09-28 LAB — COMPREHENSIVE METABOLIC PANEL
AST: 93 U/L — ABNORMAL HIGH (ref 0–37)
Albumin: 2.3 g/dL — ABNORMAL LOW (ref 3.5–5.2)
BUN: 12 mg/dL (ref 6–23)
Calcium: 6.9 mg/dL — ABNORMAL LOW (ref 8.4–10.5)
Creatinine, Ser: 0.64 mg/dL (ref 0.50–1.10)
Total Protein: 5.3 g/dL — ABNORMAL LOW (ref 6.0–8.3)

## 2011-09-28 LAB — PROCALCITONIN: Procalcitonin: 6.68 ng/mL

## 2011-09-28 LAB — MAGNESIUM: Magnesium: 1.7 mg/dL (ref 1.5–2.5)

## 2011-09-28 LAB — LACTIC ACID, PLASMA: Lactic Acid, Venous: 3.7 mmol/L — ABNORMAL HIGH (ref 0.5–2.2)

## 2011-09-28 LAB — HEPARIN LEVEL (UNFRACTIONATED): Heparin Unfractionated: 0.11 IU/mL — ABNORMAL LOW (ref 0.30–0.70)

## 2011-09-28 LAB — CLOSTRIDIUM DIFFICILE BY PCR: Toxigenic C. Difficile by PCR: NEGATIVE

## 2011-09-28 MED ORDER — HEPARIN (PORCINE) IN NACL 100-0.45 UNIT/ML-% IJ SOLN
1150.0000 [IU]/h | INTRAMUSCULAR | Status: DC
  Administered 2011-09-28: 900 [IU]/h via INTRAVENOUS
  Administered 2011-09-29: 1150 [IU]/h via INTRAVENOUS
  Filled 2011-09-28 (×3): qty 250

## 2011-09-28 MED ORDER — MAGNESIUM SULFATE 40 MG/ML IJ SOLN
2.0000 g | Freq: Once | INTRAMUSCULAR | Status: AC
  Administered 2011-09-28: 2 g via INTRAVENOUS
  Filled 2011-09-28: qty 50

## 2011-09-28 MED ORDER — INSULIN GLARGINE 100 UNIT/ML ~~LOC~~ SOLN
25.0000 [IU] | SUBCUTANEOUS | Status: DC
  Administered 2011-09-28: 25 [IU] via SUBCUTANEOUS

## 2011-09-28 MED ORDER — LORAZEPAM 2 MG/ML IJ SOLN
2.0000 mg | INTRAMUSCULAR | Status: DC | PRN
  Administered 2011-09-28: 2 mg via INTRAVENOUS
  Administered 2011-09-28: 1 mg via INTRAVENOUS
  Administered 2011-09-29 (×2): 2 mg via INTRAVENOUS
  Filled 2011-09-28 (×4): qty 1

## 2011-09-28 MED ORDER — ASPIRIN 325 MG PO TABS
325.0000 mg | ORAL_TABLET | Freq: Every day | ORAL | Status: DC
  Administered 2011-09-28 – 2011-10-08 (×11): 325 mg
  Filled 2011-09-28 (×13): qty 1

## 2011-09-28 MED ORDER — INSULIN ASPART 100 UNIT/ML ~~LOC~~ SOLN
0.0000 [IU] | SUBCUTANEOUS | Status: DC
  Administered 2011-09-28 – 2011-09-29 (×4): 2 [IU] via SUBCUTANEOUS
  Administered 2011-09-29: 3 [IU] via SUBCUTANEOUS
  Administered 2011-09-29 – 2011-09-30 (×6): 2 [IU] via SUBCUTANEOUS
  Administered 2011-10-01 (×2): 3 [IU] via SUBCUTANEOUS
  Administered 2011-10-01 (×2): 2 [IU] via SUBCUTANEOUS
  Administered 2011-10-02: 3 [IU] via SUBCUTANEOUS
  Administered 2011-10-02: 2 [IU] via SUBCUTANEOUS
  Administered 2011-10-02: 3 [IU] via SUBCUTANEOUS
  Administered 2011-10-02 (×4): 2 [IU] via SUBCUTANEOUS
  Administered 2011-10-03 (×2): 3 [IU] via SUBCUTANEOUS
  Administered 2011-10-03: 2 [IU] via SUBCUTANEOUS
  Administered 2011-10-03: 3 [IU] via SUBCUTANEOUS
  Administered 2011-10-03: 2 [IU] via SUBCUTANEOUS
  Administered 2011-10-04 (×2): 3 [IU] via SUBCUTANEOUS

## 2011-09-28 MED ORDER — INSULIN ASPART 100 UNIT/ML ~~LOC~~ SOLN
1.0000 [IU] | SUBCUTANEOUS | Status: DC
  Administered 2011-09-28: 1 [IU] via SUBCUTANEOUS

## 2011-09-28 MED ORDER — PROMOTE PO LIQD
1000.0000 mL | ORAL | Status: DC
  Administered 2011-09-28 – 2011-10-04 (×7): 1000 mL
  Filled 2011-09-28 (×10): qty 1000

## 2011-09-28 MED ORDER — LEVOFLOXACIN IN D5W 750 MG/150ML IV SOLN
750.0000 mg | INTRAVENOUS | Status: DC
  Administered 2011-09-28 – 2011-10-06 (×9): 750 mg via INTRAVENOUS
  Filled 2011-09-28 (×10): qty 150

## 2011-09-28 MED ORDER — POTASSIUM CHLORIDE 10 MEQ/50ML IV SOLN
INTRAVENOUS | Status: AC
  Administered 2011-09-28: 10 meq
  Filled 2011-09-28: qty 50

## 2011-09-28 MED ORDER — FUROSEMIDE 10 MG/ML IJ SOLN
40.0000 mg | Freq: Two times a day (BID) | INTRAMUSCULAR | Status: AC
  Administered 2011-09-28 (×2): 40 mg via INTRAVENOUS
  Filled 2011-09-28: qty 4

## 2011-09-28 MED ORDER — PRO-STAT SUGAR FREE PO LIQD
30.0000 mL | Freq: Four times a day (QID) | ORAL | Status: DC
  Administered 2011-09-28 – 2011-10-05 (×24): 30 mL via ORAL
  Filled 2011-09-28 (×31): qty 30

## 2011-09-28 MED ORDER — HEPARIN BOLUS VIA INFUSION
2000.0000 [IU] | Freq: Once | INTRAVENOUS | Status: AC
  Administered 2011-09-28: 2000 [IU] via INTRAVENOUS
  Filled 2011-09-28: qty 2000

## 2011-09-28 MED ORDER — POTASSIUM CHLORIDE 20 MEQ/15ML (10%) PO LIQD
40.0000 meq | Freq: Once | ORAL | Status: AC
  Administered 2011-09-28: 40 meq
  Filled 2011-09-28: qty 30

## 2011-09-28 MED ORDER — ADULT MULTIVITAMIN LIQUID CH
5.0000 mL | Freq: Every day | ORAL | Status: DC
  Administered 2011-09-28 – 2011-10-12 (×15): 5 mL via ORAL
  Filled 2011-09-28 (×15): qty 5

## 2011-09-28 MED ORDER — INSULIN GLARGINE 100 UNIT/ML ~~LOC~~ SOLN
10.0000 [IU] | Freq: Every day | SUBCUTANEOUS | Status: DC
  Administered 2011-09-28 – 2011-10-03 (×6): 10 [IU] via SUBCUTANEOUS

## 2011-09-28 MED ORDER — POTASSIUM CHLORIDE 10 MEQ/100ML IV SOLN
10.0000 meq | INTRAVENOUS | Status: AC
  Administered 2011-09-28 (×3): 10 meq via INTRAVENOUS
  Filled 2011-09-28 (×2): qty 100

## 2011-09-28 MED ORDER — ASPIRIN 300 MG RE SUPP
300.0000 mg | Freq: Every day | RECTAL | Status: DC
  Filled 2011-09-28: qty 1

## 2011-09-28 MED ORDER — METRONIDAZOLE IN NACL 5-0.79 MG/ML-% IV SOLN
500.0000 mg | Freq: Three times a day (TID) | INTRAVENOUS | Status: DC
  Administered 2011-09-28 – 2011-10-02 (×12): 500 mg via INTRAVENOUS
  Filled 2011-09-28 (×14): qty 100

## 2011-09-28 MED ORDER — FENTANYL CITRATE 0.05 MG/ML IJ SOLN
100.0000 ug | INTRAMUSCULAR | Status: DC | PRN
  Administered 2011-09-28: 50 ug via INTRAVENOUS
  Administered 2011-09-28 – 2011-09-29 (×3): 100 ug via INTRAVENOUS
  Administered 2011-09-29: 50 ug via INTRAVENOUS
  Filled 2011-09-28 (×5): qty 2

## 2011-09-28 MED ORDER — POTASSIUM CHLORIDE 20 MEQ/15ML (10%) PO LIQD
ORAL | Status: AC
  Administered 2011-09-28: 40 meq
  Filled 2011-09-28: qty 30

## 2011-09-28 MED ORDER — DEXTROSE 10 % IV SOLN: INTRAVENOUS | Status: DC | PRN

## 2011-09-28 NOTE — Progress Notes (Signed)
eLink Physician-Brief Progress Note Patient Name: Alicia Guerrero DOB: 1935/10/05 MRN: [id number]  Date of Service  09/28/2011   HPI/Events of Note   fever  eICU Interventions  Pan culture Sepsis biomarkers   Intervention Category Intermediate Interventions: Other:  Alicia Guerrero 09/28/2011, 12:25 AM

## 2011-09-28 NOTE — Progress Notes (Signed)
Wasted 15cc versed and 200 cc fentanyl in the med room sink  wittness .  Alicia Guerrero.

## 2011-09-28 NOTE — Progress Notes (Signed)
eLink Physician-Brief Progress Note Patient Name: Alicia Guerrero DOB: 10/17/1935 MRN: [id number]  Date of Service  09/28/2011   HPI/Events of Note     eICU Interventions  Hypokalemia -repleted    Intervention Category Intermediate Interventions: Electrolyte abnormality - evaluation and management  ALVA,RAKESH V. 09/28/2011, 5:07 PM

## 2011-09-28 NOTE — Progress Notes (Signed)
Name: Alicia Guerrero MRN: [id number] DOB: 1935-05-08    LOS: 2  Referring Provider:  [facility] Reason for Referral:  Cardiac arrest  PULMONARY / CRITICAL CARE MEDICINE  Patient summary:  76 yo with extensive cardiac history brought to [facility] ED after VT/VF arrest.  Transferred to [facility] for hypothermia protocol.  Events Since Admission: 9/10  VT/VF arrest, intubated, hypothermia initiated 9/11  Hypothermia stopped as multiple arrhythmias and coagulopathy  Interval history:  Pressor requirements decreased.  Nonverbal but follows commands when sedation decreased.  Vital Signs: Temp:  [93 F (33.9 C)-101 F (38.3 C)] 100.8 F (38.2 C) (09/12 0800) Pulse Rate:  [57-102] 85  (09/12 0941) Resp:  [21-30] 28  (09/12 0941) BP: (101-149)/(45-103) 129/54 mmHg (09/12 0941) SpO2:  [88 %-100 %] 94 % (09/12 0941) FiO2 (%):  [40 %-70 %] 40 % (09/12 0941) Weight:  [88.2 kg (194 lb 7.1 oz)] 88.2 kg (194 lb 7.1 oz) (09/12 0300)  Physical Examination: General:  Intubated, mechanically ventilated Neuro:  Nonfocal, follows commands HEENT: PERRL Neck:  No JVD Cardiovascular:  RRR, no murmurs Lungs:  Bilateral air entry, no w/r/r Abdomen:  Soft, bowel sounds present Musculoskeletal:  No edema Skin:  Intact.  Active Problems:  HYPERTENSION  CORONARY ARTERY DISEASE  Cardiac arrest - ventricular fibrillation  Acute respiratory failure  Shock  Altered mental status  Acute non-ST segment elevation myocardial infarction  ASSESSMENT AND PLAN  PULMONARY  Lab 09/28/11 0352 09/27/11 2013 09/27/11 1353 09/27/11 1217 09/27/11 1046  PHART 7.479* 7.341* 7.310* 7.251* 7.163*  PCO2ART 34.5* 44.9 37.2 39.1 44.4  PO2ART 69.0* 113.0* 72.0* 48.0* 53.0*  HCO3 25.5* 24.3* 19.3* 17.9* 16.8*  O2SAT 94.0 98.0 95.0 84.0 86.0   Ventilator Settings: Vent Mode:  [-] PRVC FiO2 (%):  [40 %-70 %] 40 % Set Rate:  [24 bmp] 24 bmp Vt Set:  [500 mL] 500 mL PEEP:  [5 cmH20-10 cmH20] 5 cmH20 Pressure Support:  [5 cmH20] 5  cmH20 Plateau Pressure:  [21 cmH20-29 cmH20] 23 cmH20  CXR:  9/11 >>> Hardware in good position, improved aeration, small left effusion ETT:  9/10>>>  A:  Acute respiratory failure post cardiac arrest.  P:   Goal pH > 7.30, SpO2> 92 SBT now and daily  CARDIOVASCULAR  Lab 09/28/11 0436 09/27/11 1059 09/27/11 0556 09/27/11 0349 09/27/11 0348 09/27/11 0002 09/26/11 1935 09/26/11 1808 09/26/11 1524  TROPONINI -- -- 5.77* -- -- 6.94* -- 2.19* <0.30  LATICACIDVEN 3.7* 6.5* -- -- 5.5* -- 5.1* -- --  PROBNP -- -- -- 544.8* -- -- -- 116.0 --   TTE:  9/10 >>> EF 45%, severe hypokinesis of inferior myocardium consistent with MI. ECG:  9/11 >>> LBBB Lines: L IJ TLC 9/10 >>  A: VT/VF arrest. NSTEMI.  Arrhythmias.  Shock, likely cardiogenic, resolving, decreased vasopressor requirements. P:  Cardiology consult appreciated Amiodarone d/c'd by Cardiology Heparin gtt per Cardiology recommendations after checking INR Beta blocker / ACE contraindicated (shock) ASA Statin contraindicated (elevated cardiac enzymes) Out of window for emergent cardiac cath, but possibly  elective procedure before discharge Continue Vasopressin Titrate Levophed to off Goal MAP 60-65  RENAL  Lab 09/28/11 0355 09/27/11 2230 09/27/11 1753 09/27/11 1425 09/27/11 1000 09/27/11 0600 09/26/11 1802 09/26/11 1524  NA 143 143 144 141 143 -- -- --  K 4.3 3.8 -- -- -- -- -- --  CL 107 108 110 107 111 -- -- --  CO2 26 24 24 24 20  -- -- --  BUN 12 11  13 13 15  -- -- --  CREATININE 0.64 0.53 0.57 0.60 0.70 -- -- --  CALCIUM 6.9* 7.3* 6.3* 7.1* 7.3* -- -- --  MG 1.7 -- -- -- -- 1.9 1.9 2.6*  PHOS 2.4 -- -- 2.2* -- 0.6* 3.8 7.2*   Intake/Output      09/11 0701 - 09/12 0700 09/12 0701 - 09/13 0700   I.V. (mL/kg) 4683.3 (53.1) 123.7 (1.4)   IV Piggyback 2005    Total Intake(mL/kg) 6688.3 (75.8) 123.7 (1.4)   Urine (mL/kg/hr) 1935 (0.9) 125   Stool 1000 350   Total Output 2935 475   Net +3753.3 -351.3        Stool  Occurrence 3 x     Foley:  In place.  A: Hypomagnesemia.  Metabolic acidosis resolved.  Positive fluid balance. P:   Replete Mg Trend BMP Bicarbonate gtt d/c'd Lasix 40 mg IV q12h x 2 doses  GASTROINTESTINAL  Lab 09/28/11 0355 09/27/11 0349 09/26/11 1802  AST 93* 253* 293*  ALT 120* 214* 281*  ALKPHOS 73 80 111  BILITOT 0.7 0.8 0.9  PROT 5.3* 6.0 6.9  ALBUMIN 2.3* 2.9* 3.6   A:  Shocked liver, improving transaminases. P:   NPO as intubated Start TF  HEMATOLOGIC  Lab 09/28/11 0355 09/27/11 0600 09/27/11 0205 09/27/11 0159 09/26/11 2346 09/26/11 2153 09/26/11 1802 09/26/11 1524  HGB 12.2 14.4 -- 15.0 15.6* 16.3* -- --  HCT 35.9* 43.6 -- 44.0 46.0 48.0* -- --  PLT 243 318 -- -- -- -- 277 204  INR -- -- 3.53* -- -- -- 2.96* 2.71*  APTT -- -- 59* -- -- -- 39* --   A:  Coagulopathy (Coumadin induced, exacerbated by hypothermia).  No overt hemorrhage. P: Trend CBC, INR Hold Coumadin  INFECTIOUS  Lab 09/28/11 0355 09/27/11 0600 09/27/11 0349 09/26/11 1802 09/26/11 1524  WBC 12.5* 18.7* -- 24.4* 9.3  PROCALCITON 6.68 -- 6.28 -- --   Cultures: 9/12  Blood >>> 9/12  Urine >>> 9/12  Respiratory >>>  Antibiotics: 9/12  Levaquin (empirical, G-) >>> 9/12  Flagyl (empirical, anaerobes, C.diff) >>>  A:  No clear source of infection.  Febrile last night.  White count improving.  Diarrhea. P: ABx / Cx as above C. Diff PCR  ENDOCRINE  Lab 09/27/11 2301 09/27/11 2209 09/27/11 2107 09/27/11 2004 09/27/11 1900  GLUCAP 120* 118* 133* 148* 201*   A:  Hyperglycemia.  Presumed adrenal insufficiency. P:   Continue Hydrocortisone 50 q6h Insulin gtt d/c'd SSI Lantus  NEUROLOGIC  A:  Acute encephalopathy. P:   Continuous Fntanyl / Versed Nimbex d/c'd Start itermittent Fentanyl / Ativan  BEST PRACTICES / DISPO ICU status Full code TF Protonix SCDs Skin intact Family updated at bedside  The patient is critically ill with multiple organ systems failure and  requires high complexity decision making for assessment and support, frequent evaluation and titration of therapies, application of advanced monitoring technologies and extensive interpretation of multiple databases. Critical Care Time devoted to patient care services described in this note is 45 minutes.  Lonia Farber, M.D. Pulmonary and Critical Care Medicine [facility] Pager: [phone]  09/28/2011, 10:20 AM

## 2011-09-28 NOTE — Progress Notes (Signed)
eLink Physician-Brief Progress Note Patient Name: Alicia Guerrero DOB: 1935/06/30 MRN: [id number]  Date of Service  09/28/2011   HPI/Events of Note    Lab 09/28/11 0352 09/27/11 2013 09/27/11 1353 09/27/11 1217 09/27/11 1046  PHART 7.479* 7.341* 7.310* 7.251* 7.163*  PCO2ART 34.5* 44.9 37.2 39.1 44.4  PO2ART 69.0* 113.0* 72.0* 48.0* 53.0*  HCO3 25.5* 24.3* 19.3* 17.9* 16.8*  TCO2 26 26 21 19 18   O2SAT 94.0 98.0 95.0 84.0 86.0    Lab 09/28/11 0355 09/27/11 2230 09/27/11 1753 09/27/11 1425 09/27/11 1000 09/27/11 0600 09/26/11 1802 09/26/11 1524  NA 143 143 144 141 143 -- -- --  K 4.3 3.8 -- -- -- -- -- --  CL 107 108 110 107 111 -- -- --  CO2 26 24 24 24 20  -- -- --  GLUCOSE 179* 122* 249* 376* 415* -- -- --  BUN 12 11 13 13 15  -- -- --  CREATININE 0.64 0.53 0.57 0.60 0.70 -- -- --  CALCIUM 6.9* 7.3* 6.3* 7.1* 7.3* -- -- --  MG 1.7 -- -- -- -- 1.9 1.9 2.6*  PHOS 2.4 -- -- 2.2* -- 0.6* 3.8 7.2*     eICU Interventions  Based on above, will stop bicarb gtt and reckcheck abg in 2h Based on above, give 2gm mag sulfate    Intervention Category Intermediate Interventions: Diagnostic test evaluation  Enos Muhl 09/28/2011, 4:50 AM

## 2011-09-28 NOTE — Progress Notes (Signed)
ANTICOAGULATION CONSULT NOTE - Follow Up Consult  Pharmacy Consult for heparin Indication: atrial fibrillation  Labs:  Basename 09/28/11 2215 09/28/11 1500 09/28/11 1049 09/28/11 0355 09/27/11 2230 09/27/11 0600 09/27/11 0556 09/27/11 0205 09/27/11 0159 09/27/11 0002 09/26/11 1808 09/26/11 1802  HGB -- -- -- 12.2 -- 14.4 -- -- -- -- -- --  HCT -- -- -- 35.9* -- 43.6 -- -- 44.0 -- -- --  PLT -- -- -- 243 -- 318 -- -- -- -- -- 277  APTT -- -- -- -- -- -- -- 59* -- -- -- 39*  LABPROT -- -- 16.1* -- -- -- -- 35.9* -- -- -- 31.3*  INR -- -- 1.26 -- -- -- -- 3.53* -- -- -- 2.96*  HEPARINUNFRC 0.11* -- -- -- -- -- -- -- -- -- -- --  CREATININE -- 0.78 -- 0.64 0.53 -- -- -- -- -- -- --  CKTOTAL -- -- -- -- -- -- 1385* -- -- -- -- --  CKMB -- -- -- -- -- -- 60.6* -- -- -- -- --  TROPONINI -- -- -- -- -- -- 5.77* -- -- 6.94* 2.19* --    Assessment: 76yo female subtherapeutic on heparin with initial dosing for Afib and low INR after hypothermia D/C'd.  Goal of Therapy:  Heparin level 0.3-0.7 units/ml   Plan:  Will rebolus with heparin 2000 units x1 and increase gtt by 3 units/kg/hr to 1150 units/hr and check level in 8hr.  Colleen Can PharmD BCPS 09/28/2011,11:42 PM

## 2011-09-28 NOTE — Progress Notes (Signed)
ANTICOAGULATION CONSULT NOTE - Initial Consult  Pharmacy Consult for Heparin Indication: atrial fibrillation/ACS  Allergies  Allergen Reactions  . Meperidine Hcl Other (See Comments)    Mixed with phenergan swelling of the mouth   . Penicillins Other (See Comments)    Unknown reaction  . Molds & Smuts Other (See Comments)    Runny nose  . Tape Other (See Comments)    Redness, pulls skin off    Patient Measurements: Height: 5' 3.5" (161.3 cm) Weight: 194 lb 7.1 oz (88.2 kg) IBW/kg (Calculated) : 53.55  Heparin Dosing Weight: 73 kg  Vital Signs: Temp: 101 F (38.3 C) (09/12 1100) BP: 132/51 mmHg (09/12 1056) Pulse Rate: 84  (09/12 1300)  Labs:  Basename 09/28/11 1049 09/28/11 0355 09/27/11 2230 09/27/11 1753 09/27/11 0600 09/27/11 0556 09/27/11 0205 09/27/11 0159 09/27/11 0002 09/26/11 1808 09/26/11 1802  HGB -- 12.2 -- -- 14.4 -- -- -- -- -- --  HCT -- 35.9* -- -- 43.6 -- -- 44.0 -- -- --  PLT -- 243 -- -- 318 -- -- -- -- -- 277  APTT -- -- -- -- -- -- 59* -- -- -- 39*  LABPROT 16.1* -- -- -- -- -- 35.9* -- -- -- 31.3*  INR 1.26 -- -- -- -- -- 3.53* -- -- -- 2.96*  HEPARINUNFRC -- -- -- -- -- -- -- -- -- -- --  CREATININE -- 0.64 0.53 0.57 -- -- -- -- -- -- --  CKTOTAL -- -- -- -- -- 1385* -- -- -- -- --  CKMB -- -- -- -- -- 60.6* -- -- -- -- --  TROPONINI -- -- -- -- -- 5.77* -- -- 6.94* 2.19* --    Estimated Creatinine Clearance: 65.6 ml/min (by C-G formula based on Cr of 0.64).   Medical History: Past Medical History  Diagnosis Date  . Left bundle branch block   . Atrial fibrillation   . Acute myocardial infarction, unspecified site, episode of care unspecified   . Coronary atherosclerosis   . Pure hypercholesterolemia   . Unspecified essential hypertension   . Cough   . Other atopic dermatitis and related conditions   . Acute upper respiratory infections of unspecified site   . Allergic rhinitis due to pollen   . Unspecified asthma   . Other and  unspecified ovarian cyst   . Personal history of malignant neoplasm of breast   . Personal history of hyperthyroidism   . Chronic eczema     hands  . Hx of coronary angioplasty     percutaneous transluminal  . Hemorrhoids   . Cancer     Assessment: 32 YOF on chronic coumadin for Afib, s/p VT arrest on admission, with NSTEMI to start IV heparin. INR elevated (3.53) yesterday, down to 1.26 today. Hgb 12.2, plt 243  Goal of Therapy:  Heparin level 0.3-0.7 units/ml Monitor platelets by anticoagulation protocol: Yes   Plan:  - Heparin bolus 2000 units x 1 - Heparin infusion 900 units/hr - f/u 8 hr heparin level at 2200 - daily heparin level and CBC with Am labs  Bayard Hugger, PharmD, BCPS  Clinical Pharmacist  Pager: [phone]  09/28/2011,1:36 PM

## 2011-09-28 NOTE — Progress Notes (Signed)
Nutrition Follow-up  Intervention:   1. Initiate promote @ 30 ml/hr via OG tube this is the goal rate. 30 ml Prostat 4 times daily.  At goal rate, tube feeding regimen will provide 1120 kcal (60%), 105 grams of protein (100%), and 604 ml of H2O.    Assessment:   Patient is currently intubated on ventilator support, s/p hypothermia protocol. RD has been consulted for initiation and management of TF. RD notified RN of TF to be started.   MV: 12 Temp:Temp (24hrs), Avg:97.9 F (36.6 C), Min:93.9 F (34.4 C), Max:101 F (38.3 C)  Propofol: none    Diet Order:  NPO  Meds: Scheduled Meds:   . antiseptic oral rinse  15 mL Mouth Rinse Q4H  . aspirin  300 mg Rectal Daily  . calcium gluconate  1 g Intravenous Once  . chlorhexidine  15 mL Mouth/Throat BID  . DOBUTamine      . furosemide  40 mg Intravenous Q12H  . hydrocortisone sod succinate (SOLU-CORTEF) injection  50 mg Intravenous Q6H  . insulin aspart  0-15 Units Subcutaneous Q4H  . insulin glargine  10 Units Subcutaneous QHS  . levofloxacin (LEVAQUIN) IV  750 mg Intravenous Q24H  . magnesium sulfate 1 - 4 g bolus IVPB  2 g Intravenous Once  . magnesium sulfate 1 - 4 g bolus IVPB  2 g Intravenous Once  . metronidazole  500 mg Intravenous Q8H  . pantoprazole (PROTONIX) IV  40 mg Intravenous Q24H  . potassium chloride  10 mEq Intravenous Q1 Hr x 4  . potassium phosphate IVPB (mmol)  30 mmol Intravenous Once  . sodium bicarbonate      . DISCONTD: sodium chloride  2,000 mL Intravenous Once  . DISCONTD: artificial tears  1 application Both Eyes Q8H  . DISCONTD: insulin aspart  1-3 Units Subcutaneous Q4H  . DISCONTD: insulin glargine  25 Units Subcutaneous Q24H   Continuous Infusions:   . norepinephrine (LEVOPHED) Adult infusion 6 mcg/min (09/28/11 1022)  . vasopressin (PITRESSIN) infusion - *FOR SHOCK* 0.03 Units/min (09/28/11 1000)  . DISCONTD: cisatracurium (NIMBEX) infusion Stopped (09/27/11 1823)  . DISCONTD: dextrose    .  DISCONTD: fentaNYL infusion INTRAVENOUS Stopped (09/28/11 1037)  . DISCONTD: insulin (NOVOLIN-R) infusion 7 mL/hr at 09/28/11 1000  . DISCONTD: midazolam (VERSED) infusion Stopped (09/28/11 1037)  . DISCONTD: phenylephrine (NEO-SYNEPHRINE) Adult infusion Stopped (09/27/11 1200)  . DISCONTD:  sodium bicarbonate infusion 1000 mL 100 mL/hr at 09/27/11 2308   PRN Meds:.fentaNYL, LORazepam, DISCONTD: dextrose, DISCONTD: fentaNYL  Labs:  CMP     Component Value Date/Time   NA 143 09/28/2011 0355   K 4.3 09/28/2011 0355   CL 107 09/28/2011 0355   CO2 26 09/28/2011 0355   GLUCOSE 179* 09/28/2011 0355   BUN 12 09/28/2011 0355   CREATININE 0.64 09/28/2011 0355   CALCIUM 6.9* 09/28/2011 0355   PROT 5.3* 09/28/2011 0355   ALBUMIN 2.3* 09/28/2011 0355   AST 93* 09/28/2011 0355   ALT 120* 09/28/2011 0355   ALKPHOS 73 09/28/2011 0355   BILITOT 0.7 09/28/2011 0355   GFRNONAA 86* 09/28/2011 0355   GFRAA >90 09/28/2011 0355     Intake/Output Summary (Last 24 hours) at 09/28/11 1109 Last data filed at 09/28/11 1000  Gross per 24 hour  Intake 3775.21 ml  Output   2710 ml  Net 1065.21 ml    Weight Status:  194 lbs, trending up since admission with increased fluid volumes  Re-estimated needs:  1875 kcal (underfeeding goal 4098-1191  kcal) and 107-120 gm protein   Nutrition Dx:  Inadequate oral intake r/t inability to eat AEB mechanically ventilated   Goal:  Enteral nutrition to provide 60-70% of estimated calorie needs (22-25 kcals/kg ideal body weight) and >/= 90% of estimated protein needs, based on ASPEN guidelines for permissive underfeeding in critically ill obese individuals. Unmet at this time  Monitor:  Vent status, TF initiation and tolerance, weight, labs   Clarene Duke RD, LDN Pager [phone] After Hours pager [phone]

## 2011-09-28 NOTE — Progress Notes (Signed)
Patient Name: Alicia Guerrero      SUBJECTIVE:  On Norepi and still on vasopressin; hemoduynamic parameters better  She is awake and responds to voice with head turning but not to command  Past Medical History  Diagnosis Date  . Left bundle branch block   . Atrial fibrillation   . Acute myocardial infarction, unspecified site, episode of care unspecified   . Coronary atherosclerosis   . Pure hypercholesterolemia   . Unspecified essential hypertension   . Cough   . Other atopic dermatitis and related conditions   . Acute upper respiratory infections of unspecified site   . Allergic rhinitis due to pollen   . Unspecified asthma   . Other and unspecified ovarian cyst   . Personal history of malignant neoplasm of breast   . Personal history of hyperthyroidism   . Chronic eczema     hands  . Hx of coronary angioplasty     percutaneous transluminal  . Hemorrhoids   . Cancer     PHYSICAL EXAM Filed Vitals:   09/28/11 0617 09/28/11 0700 09/28/11 0741 09/28/11 0800  BP:   127/49   Pulse: 69 67 74 72  Temp:    100.8 F (38.2 C)  TempSrc:      Resp: 24 24 25 22   Height:      Weight:      SpO2: 96% 98% 96% 97%    General appearance: sedated but arousable to voice Intubated Moving arms Lungs: good air movement Heart: regular rate and rhythm, S1, S2 normal, no murmur, click, rub or gallop Extremities: extremities normal, atraumatic, no cyanosis or edema Pulses: 2+ and symmetric Skin: Skin color, texture, turgor normal. No rashes or lesions Neurologic: as abpve  TELEMETRY: Reviewed telemetry pt in sinus:    Intake/Output Summary (Last 24 hours) at 09/28/11 0811 Last data filed at 09/28/11 0700  Gross per 24 hour  Intake 6395.1 ml  Output   2685 ml  Net 3710.1 ml    LABS: Basic Metabolic Panel:  Lab 09/28/11 1610 09/27/11 2230 09/27/11 1753 09/27/11 1425 09/27/11 1000 09/27/11 0600 09/27/11 0159 09/26/11 1935  NA 143 143 144 141 143 142 147* --  K 4.3 3.8 3.6  4.3 2.9* 3.1* 2.8* --  CL 107 108 110 107 111 113* 112 --  CO2 26 24 24 24 20  15* -- 23  GLUCOSE 179* 122* 249* 376* 415* 294* 345* --  BUN 12 11 13 13 15 16 14  --  CREATININE 0.64 0.53 0.57 0.60 0.70 0.68 0.80 --  CALCIUM 6.9* 7.3* -- -- -- -- -- --  MG 1.7 -- -- -- -- 1.9 -- --  PHOS 2.4 -- -- 2.2* -- -- -- --   Cardiac Enzymes:  Basename 09/27/11 0556 09/27/11 0002 09/26/11 1808  CKTOTAL 1385* -- --  CKMB 60.6* -- --  CKMBINDEX -- -- --  TROPONINI 5.77* 6.94* 2.19*   CBC:  Lab 09/28/11 0355 09/27/11 0600 09/27/11 0159 09/26/11 2346 09/26/11 2153 09/26/11 1802 09/26/11 1524  WBC 12.5* 18.7* -- -- -- 24.4* 9.3  NEUTROABS -- -- -- -- -- 21.9* --  HGB 12.2 14.4 15.0 15.6* 16.3* 14.0 12.7  HCT 35.9* 43.6 44.0 46.0 48.0* 43.0 39.7  MCV 78.0 81.2 -- -- -- 81.6 84.1  PLT 243 318 -- -- -- 277 204   PROTIME:  Basename 09/27/11 0205 09/26/11 1802 09/26/11 1524  LABPROT 35.9* 31.3* 29.2*  INR 3.53* 2.96* 2.71*   Liver Function Tests:  [employer]  09/28/11 0355 09/27/11 0349  AST 93* 253*  ALT 120* 214*  ALKPHOS 73 80  BILITOT 0.7 0.8  PROT 5.3* 6.0  ALBUMIN 2.3* 2.9*   No results found for this basename: LIPASE:2,AMYLASE:2 in the last 72 hours BNP: BNP (last 3 results)  Basename 09/27/11 0349 09/26/11 1808  PROBNP 544.8* 116.0      ASSESSMENT AND PLAN:  Patient Active Hospital Problem List: HYPERTENSION (01/01/2007)   CORONARY ARTERY DISEASE (01/01/2007)   Cardiac arrest - ventricular fibrillation (09/26/2011)   Acute respiratory failure (09/26/2011)   Shock (09/26/2011)     Acute non-ST segment elevation myocardial infarction (09/26/2011)    Fever  Repeat 12 lead Heparin if ok wi ccm Will anticipate cath as fever and hemodyanmic stabilize aASa  Signed, Sherryl Manges MD  09/28/2011

## 2011-09-29 ENCOUNTER — Inpatient Hospital Stay (HOSPITAL_COMMUNITY): Payer: Medicare Other

## 2011-09-29 LAB — GLUCOSE, CAPILLARY
Glucose-Capillary: 163 mg/dL — ABNORMAL HIGH (ref 70–99)
Glucose-Capillary: 159 mg/dL — ABNORMAL HIGH (ref 70–99)
Glucose-Capillary: 115 mg/dL — ABNORMAL HIGH (ref 70–99)
Glucose-Capillary: 121 mg/dL — ABNORMAL HIGH (ref 70–99)

## 2011-09-29 LAB — BASIC METABOLIC PANEL
Calcium: 7.4 mg/dL — ABNORMAL LOW (ref 8.4–10.5)
GFR calc Af Amer: 75 mL/min — ABNORMAL LOW (ref >90)
GFR calc non Af Amer: 65 mL/min — ABNORMAL LOW (ref >90)
Glucose, Bld: 177 mg/dL — ABNORMAL HIGH (ref 70–99)
Potassium: 3.7 mEq/L (ref 3.5–5.1)
Sodium: 145 mEq/L (ref 135–145)

## 2011-09-29 LAB — CBC
Hemoglobin: 9.8 g/dL — ABNORMAL LOW (ref 12.0–15.0)
MCH: 26.3 pg (ref 26.0–34.0)
MCHC: 33 g/dL (ref 30.0–36.0)
RDW: 14.7 % (ref 11.5–15.5)

## 2011-09-29 LAB — PROTIME-INR: Prothrombin Time: 35.7 seconds — ABNORMAL HIGH (ref 11.6–15.2)

## 2011-09-29 MED ORDER — HYDROCORTISONE SOD SUCCINATE 100 MG IJ SOLR
50.0000 mg | Freq: Two times a day (BID) | INTRAMUSCULAR | Status: DC
  Administered 2011-09-29 – 2011-09-30 (×2): 50 mg via INTRAVENOUS
  Filled 2011-09-29 (×4): qty 1

## 2011-09-29 MED ORDER — METOPROLOL TARTRATE 1 MG/ML IV SOLN
2.5000 mg | Freq: Four times a day (QID) | INTRAVENOUS | Status: DC
  Administered 2011-09-30 – 2011-10-02 (×7): 2.5 mg via INTRAVENOUS
  Filled 2011-09-29 (×18): qty 5

## 2011-09-29 MED ORDER — DEXMEDETOMIDINE HCL IN NACL 200 MCG/50ML IV SOLN
0.2000 ug/kg/h | INTRAVENOUS | Status: DC
  Administered 2011-09-29: 0.4 ug/kg/h via INTRAVENOUS
  Filled 2011-09-29: qty 50

## 2011-09-29 MED ORDER — IOHEXOL 350 MG/ML SOLN
100.0000 mL | Freq: Once | INTRAVENOUS | Status: AC | PRN
  Administered 2011-09-29: 100 mL via INTRAVENOUS

## 2011-09-29 MED ORDER — FENTANYL CITRATE 0.05 MG/ML IJ SOLN
25.0000 ug | INTRAMUSCULAR | Status: DC | PRN
  Administered 2011-09-29 – 2011-09-30 (×2): 50 ug via INTRAVENOUS
  Administered 2011-09-30 (×2): 25 ug via INTRAVENOUS
  Administered 2011-09-30: 50 ug via INTRAVENOUS
  Filled 2011-09-29 (×6): qty 2

## 2011-09-29 MED ORDER — MIDAZOLAM HCL 2 MG/2ML IJ SOLN
2.0000 mg | INTRAMUSCULAR | Status: DC | PRN
  Administered 2011-09-29: 4 mg via INTRAVENOUS
  Administered 2011-09-29: 1 mg via INTRAVENOUS
  Administered 2011-09-29: 2 mg via INTRAVENOUS
  Administered 2011-09-29: 4 mg via INTRAVENOUS
  Administered 2011-09-29: 1 mg via INTRAVENOUS
  Administered 2011-09-29: 2 mg via INTRAVENOUS
  Administered 2011-09-30: 4 mg via INTRAVENOUS
  Administered 2011-09-30: 2 mg via INTRAVENOUS
  Administered 2011-09-30: 4 mg via INTRAVENOUS
  Administered 2011-09-30: 2 mg via INTRAVENOUS
  Filled 2011-09-29: qty 4
  Filled 2011-09-29 (×3): qty 2
  Filled 2011-09-29: qty 4
  Filled 2011-09-29: qty 2
  Filled 2011-09-29: qty 4
  Filled 2011-09-29 (×2): qty 2
  Filled 2011-09-29: qty 4

## 2011-09-29 MED ORDER — SODIUM CHLORIDE 0.9 % IV SOLN
INTRAVENOUS | Status: DC
  Administered 2011-10-01 – 2011-10-02 (×2): via INTRAVENOUS
  Administered 2011-10-04: 20 mL/h via INTRAVENOUS
  Administered 2011-10-05: 15 mL/h via INTRAVENOUS

## 2011-09-29 MED ORDER — SODIUM CHLORIDE 0.9 % IV SOLN
INTRAVENOUS | Status: DC
  Administered 2011-10-01: 05:00:00 via INTRAVENOUS
  Administered 2011-10-05: 20 mL/h via INTRAVENOUS
  Administered 2011-10-06: 05:00:00 via INTRAVENOUS

## 2011-09-29 MED ORDER — POTASSIUM CHLORIDE 20 MEQ/15ML (10%) PO LIQD
40.0000 meq | Freq: Once | ORAL | Status: AC
  Administered 2011-09-29: 40 meq
  Filled 2011-09-29: qty 30

## 2011-09-29 MED ORDER — POTASSIUM CHLORIDE 20 MEQ/15ML (10%) PO LIQD
ORAL | Status: AC
  Filled 2011-09-29: qty 30

## 2011-09-29 MED ORDER — SODIUM CHLORIDE 0.9 % IV BOLUS (SEPSIS)
500.0000 mL | Freq: Once | INTRAVENOUS | Status: AC
  Administered 2011-09-29: 500 mL via INTRAVENOUS

## 2011-09-29 NOTE — Progress Notes (Signed)
Courtesy note: reviewed events, chart, labs and stopped in to say hello to [place]. Very muchj appreciate the care she is receiving.   MEN

## 2011-09-29 NOTE — Progress Notes (Signed)
Patient was placed on PS/CPAP 10 of Pressure Support and 5 PEEP at 1330 and patient was placed back on full support at 1340. PRVC VT500. RR of 24. PEEP of 5. and 50%. Due to high respiratory rate and low saturation below 80%

## 2011-09-29 NOTE — Progress Notes (Signed)
Patient Name: Alicia Guerrero Date of Encounter: 09/29/2011  Active Problems:  HYPERTENSION  CORONARY ARTERY DISEASE  Cardiac arrest - ventricular fibrillation  Acute respiratory failure  Shock  Altered mental status  Acute non-ST segment elevation myocardial infarction    SUBJECTIVE: Responds to verbal, on the vent. Tolerated pressure-support yesterday. Still requires pressors as BP drops when given sedation. Still with diarrhea, has rectal pouch, but this has lessened.  OBJECTIVE Filed Vitals:   09/29/11 0329 09/29/11 0400 09/29/11 0500 09/29/11 0600  BP: 96/45     Pulse: 69 70 73 73  Temp:  100.4 F (38 C)  100.5 F (38.1 C)  TempSrc:      Resp: 23 24 24 24   Height:      Weight:      SpO2: 93% 94% 95% 93%    Intake/Output Summary (Last 24 hours) at 09/29/11 0716 Last data filed at 09/29/11 0600  Gross per 24 hour  Intake 1603.43 ml  Output   3955 ml  Net -2351.57 ml   Filed Weights   09/27/11 0236 09/28/11 0300 09/29/11 0300  Weight: 189 lb 9.5 oz (86 kg) 194 lb 7.1 oz (88.2 kg) 190 lb 0.6 oz (86.2 kg)     PHYSICAL EXAM General: Well developed, well nourished, female, in no acute distress. Head: Normocephalic, atraumatic.  Neck: Supple without bruits, JVD not elevated, difficult to assess due to lines, equipment. Lungs:  Resp regular and unlabored, CTA. Heart: RRR, S1, S2, no S3, S4, or murmur. Abdomen: Soft, non-tender, non-distended, BS + x 4.  Extremities: No clubbing, cyanosis, edema.  Neuro: Alert and oriented X 3. Moves all extremities spontaneously. Psych: Normal affect.  LABS: CBC: Basename 09/29/11 0500 09/28/11 0355 09/26/11 1802  WBC 10.7* 12.5* --  NEUTROABS -- -- 21.9*  HGB 9.8* 12.2 --  HCT 29.7* 35.9* --  MCV 79.8 78.0 --  PLT 185 243 --   INR: Basename 09/28/11 1049  INR 1.26   Basic Metabolic Panel: Basename 09/29/11 0500 09/28/11 1500 09/28/11 0355 09/27/11 1425 09/27/11 0600  NA 145 143 -- -- --  K 3.7 3.1* -- -- --  CL  110 105 -- -- --  CO2 24 27 -- -- --  GLUCOSE 177* 149* -- -- --  BUN 22 13 -- -- --  CREATININE 0.86 0.78 -- -- --  CALCIUM 7.4* 7.3* -- -- --  MG -- -- 1.7 -- 1.9  PHOS -- -- 2.4 2.2* --   Liver Function Tests: [facility] 09/28/11 0355 09/27/11 0349  AST 93* 253*  ALT 120* 214*  ALKPHOS 73 80  BILITOT 0.7 0.8  PROT 5.3* 6.0  ALBUMIN 2.3* 2.9*   Cardiac Enzymes: Basename 09/27/11 0556 09/27/11 0002 09/26/11 1808  CKTOTAL 1385* -- --  CKMB 60.6* -- --  CKMBINDEX -- -- --  TROPONINI 5.77* 6.94* 2.19*   No results found for this basename: TROPIPOC:2 in the last 72 hours BNP: Pro B Natriuretic peptide (BNP)  Date/Time Value Range Status  09/27/2011  3:49 AM 544.8* 0 - 125 pg/mL Final  09/26/2011  6:08 PM 116.0  0 - 125 pg/mL Final    TELE:        ECG: 28-Sep-2011 09:05:44   Sinus rhythm with Premature atrial complexes Left bundle branch block Abnormal ECG SINCE LAST TRACING HEART RATE HAS INCREASED Vent. rate 84 BPM PR interval 168 ms QRS duration 146 ms QT/QTc 420/496 ms P-R-T axes 42 7 172  Echo: 09/26/2011 Study Conclusions - Left ventricle:  The cavity size was normal. Mild septal hypertrophy, most prominent at the base. Systolic function was mildly to moderately reduced. The estimated ejection fraction was in the range of 40% to 45%. Severe hypokinesis to akinesis of the inferior myocardium consistent with infarction. - Ventricular septum: Abnormal septal motionsecondary to LBBB. - Mitral valve: Calcified annulus. - Atrial septum: No defect or patent foramen ovale was identified. - Pulmonary arteries: PA peak pressure: 42mm Hg (S).   Radiology/Studies: Dg Chest Port 1 View 09/28/2011  *RADIOLOGY REPORT*  Clinical Data: Intubated patient.  Evaluate for endotracheal tube placement.  PORTABLE CHEST - 1 VIEW  Comparison: Chest x-ray 09/27/2011.  Findings: Compared to the prior examination, the endotracheal tube has been withdrawn slightly, now with tip  approximately 3.6 cm above the carina. There is a left-sided internal jugular central venous catheter with tip terminating in the mid superior vena cava. A nasogastric tube is seen extending into the stomach, however, the tip of the nasogastric tube extends below the lower margin of the image.  Lung volumes have improved.  There are resolving bibasilar opacities, compatible with resolving areas of subsegmental atelectasis.  Small left pleural effusion.  Pulmonary venous engorgement without frank pulmonary edema.  Heart size is borderline to mildly enlarged.  Mediastinal contours are unremarkable.  Atherosclerosis in the thoracic aorta.  IMPRESSION: 1.  Support apparatus, as above.  Endotracheal tube is now appropriately located. 2.  Improving lung volumes with decreasing bibasilar subsegmental atelectasis. 3.  Small left pleural effusion. 4.  Borderline to mildly enlarged cardiac silhouette. 5.  Atherosclerosis.   Original Report Authenticated By: Florencia Reasons, M.D.     Current Medications:  . antiseptic oral rinse  15 mL Mouth Rinse Q4H  . aspirin  325 mg Per Tube Daily  . chlorhexidine  15 mL Mouth/Throat BID  . feeding supplement  30 mL Oral QID  . feeding supplement (PROMOTE)  1,000 mL Per Tube Q24H  . furosemide  40 mg Intravenous Q12H  . heparin  2,000 Units Intravenous Once  . heparin  2,000 Units Intravenous Once  . hydrocortisone sod succinate injection  50 mg Intravenous Q6H  . insulin aspart  0-15 Units Subcutaneous Q4H  . insulin glargine  10 Units Subcutaneous QHS  . levofloxacin (LEVAQUIN) IV  750 mg Intravenous Q24H  . magnesium sulfate 1 - 4 g bolus IVPB  2 g Intravenous Once  . metronidazole  500 mg Intravenous Q8H  . multivitamin  5 mL Oral Daily  . pantoprazole (PROTONIX) IV  40 mg Intravenous Q24H  . potassium chloride  10 mEq Intravenous Q1 Hr x 3  . potassium chloride      . potassium chloride  40 mEq Per Tube Once   . heparin 1,150 Units/hr (09/28/11 2351)  .  norepinephrine (LEVOPHED) Adult infusion Stopped (09/28/11 1406)  . vasopressin (PITRESSIN) infusion - *FOR SHOCK* 0.03 Units/min (09/28/11 2000)    ASSESSMENT AND PLAN:  Cardiac arrest - ventricular fibrillation - Seen by [facility] and anti-arrhythmics not indicated, no BB secondary to hypotension   Acute non-ST segment elevation myocardial infarction/CORONARY ARTERY DISEASE - She will need cath once more hemodynamically stable, consider scheduling next week, if she continues to improve.  From [facility] consult note 9/11: A catastrophic event yesterday assoc with cardiac arrest with prolonged resuscitation. + Tn are hard to interpret in the setting of arrest as is the low potassium esp in the setting of prolonged resuscitation using adrenergic agents, although she has had documented hypokalemia in the  past, most recent was 4.0 on July 17,2013  The echo showed a new WMA last night, but by the time we saw here this am at 0900 she is well past the 12 hr window to justify emergent cath, esp in the absence of STE  The initial event could possibly have been IMI as suggested by the echo; her LVEF was previously not so bad that one would suspect reeentrant VT  The complex ectopy of the last 16 hrs may well be aggravated by hypokalemia, which is likely 2/2 prolonged resusciation, high dose stimulants required for pressors and ischemia/reperfusion  For now, in the absence of being sustained, I would not utilize antiarrhythmic therapy  Otherwise, per primary MD Active Problems:  HYPERTENSION  Acute respiratory failure  Shock  Altered mental status    Signed, Theodore Demark , PA-C 7:16 AM 09/29/2011 Slowly improving. Off norepi. Hopefully can wean off vent today. Cardiac work up once stable and afebrile.

## 2011-09-29 NOTE — Progress Notes (Signed)
Name: Alicia Guerrero MRN: [id number] DOB: 20-Sep-1935    LOS: 3  Referring Provider:  [facility] Reason for Referral:  Cardiac arrest  PULMONARY / CRITICAL CARE MEDICINE  Patient summary:  76 yo with extensive cardiac history brought to [facility] ED after VT/VF arrest.  Transferred to [facility] for hypothermia protocol.  Events Since Admission: 9/10  VT/VF arrest, intubated, hypothermia initiated 9/11  Hypothermia stopped as multiple arrhythmias and coagulopathy  Interval history:  Off pressors.  RN reports significant anxiety / agitation off sedation.  Vital Signs: Temp:  [100 F (37.8 C)-101.3 F (38.5 C)] 100.4 F (38 C) (09/13 0700) Pulse Rate:  [69-95] 82  (09/13 0840) Resp:  [22-34] 34  (09/13 0840) BP: (96-132)/(39-54) 123/52 mmHg (09/13 0840) SpO2:  [87 %-95 %] 92 % (09/13 0840) FiO2 (%):  [40 %-50 %] 50 % (09/13 0840) Weight:  [86.2 kg (190 lb 0.6 oz)] 86.2 kg (190 lb 0.6 oz) (09/13 0300)  Physical Examination: General:  Intubated, mechanically ventilated Neuro:  Nonfocal, follows some commands HEENT: PERRL Neck:  No JVD Cardiovascular:  RRR, no murmurs Lungs:  Bilateral air entry, few rales Abdomen:  Soft, bowel sounds present Musculoskeletal:  No edema Skin:  Intact.  Active Problems:  HYPERTENSION  CORONARY ARTERY DISEASE  Cardiac arrest - ventricular fibrillation  Acute respiratory failure  Shock  Altered mental status  Acute non-ST segment elevation myocardial infarction  ASSESSMENT AND PLAN  PULMONARY  Lab 09/28/11 0352 09/27/11 2013 09/27/11 1353 09/27/11 1217 09/27/11 1046  PHART 7.479* 7.341* 7.310* 7.251* 7.163*  PCO2ART 34.5* 44.9 37.2 39.1 44.4  PO2ART 69.0* 113.0* 72.0* 48.0* 53.0*  HCO3 25.5* 24.3* 19.3* 17.9* 16.8*  O2SAT 94.0 98.0 95.0 84.0 86.0   Ventilator Settings: Vent Mode:  [-] PRVC FiO2 (%):  [40 %-50 %] 50 % Set Rate:  [24 bmp] 24 bmp Vt Set:  [500 mL] 500 mL PEEP:  [5 cmH20] 5 cmH20 Pressure Support:  [10 cmH20] 10 cmH20 Plateau  Pressure:  [17 cmH20-23 cmH20] 17 cmH20  CXR:  9/11 >>> Hardware in good position, improved aeration, small left effusion ETT:  9/10>>>  A:  Acute respiratory failure post cardiac arrest. Hypoxia appears to be out of proportion with parenchymal disease.  Pulmonary embolism possible but not very likely as anticoagulated at the time of initial event? P:   Goal pH > 7.30, SpO2> 92 SBT daily Chest CT angio Already on Heparin for ACS  CARDIOVASCULAR  Lab 09/28/11 0436 09/27/11 1059 09/27/11 0556 09/27/11 0349 09/27/11 0348 09/27/11 0002 09/26/11 1935 09/26/11 1808 09/26/11 1524  TROPONINI -- -- 5.77* -- -- 6.94* -- 2.19* <0.30  LATICACIDVEN 3.7* 6.5* -- -- 5.5* -- 5.1* -- --  PROBNP -- -- -- 544.8* -- -- -- 116.0 --   TTE:  9/10 >>> EF 45%, severe hypokinesis of inferior myocardium consistent with MI. ECG:  9/11 >>> LBBB Lines: L IJ TLC 9/10 >>  A: VT/VF arrest. NSTEMI.  Arrhythmias.  Shock, likely cardiogenic, resolved, off pressors. P:  D/c Vasopressin / Levophed Heparin gtt per Cardiology recommendations after checking INR Start low dose beta blocker - Metoprolol 2.5 IV q6h Hold ACE for now ASA Statin contraindicated (elevated liver enzymes) Out of window for emergent cardiac cath, but possibly  elective procedure before discharge Goal MAP 60-65  RENAL  Lab 09/29/11 0500 09/28/11 1500 09/28/11 0355 09/27/11 2230 09/27/11 1753 09/27/11 1425 09/27/11 0600 09/26/11 1802 09/26/11 1524  NA 145 143 143 143 144 -- -- -- --  K  3.7 3.1* -- -- -- -- -- -- --  CL 110 105 107 108 110 -- -- -- --  CO2 24 27 26 24 24  -- -- -- --  BUN 22 13 12 11 13  -- -- -- --  CREATININE 0.86 0.78 0.64 0.53 0.57 -- -- -- --  CALCIUM 7.4* 7.3* 6.9* 7.3* 6.3* -- -- -- --  MG -- -- 1.7 -- -- -- 1.9 1.9 2.6*  PHOS -- -- 2.4 -- -- 2.2* 0.6* 3.8 7.2*   Intake/Output      09/12 0701 - 09/13 0700 09/13 0701 - 09/14 0700   I.V. (mL/kg) 545.9 (6.3) 18.6 (0.2)   NG/GT 290 30   IV Piggyback 818    Total  Intake(mL/kg) 1653.9 (19.2) 48.6 (0.6)   Urine (mL/kg/hr) 3045 (1.5) 125   Emesis/NG output 300    Stool 610    Total Output 3955 125   Net -2301.1 -76.4         Foley:  9/10 >>>  A: Normal renal function.  Hypokalemia. P:   Replete K Trend BMP  GASTROINTESTINAL  Lab 09/28/11 0355 09/27/11 0349 09/26/11 1802  AST 93* 253* 293*  ALT 120* 214* 281*  ALKPHOS 73 80 111  BILITOT 0.7 0.8 0.9  PROT 5.3* 6.0 6.9  ALBUMIN 2.3* 2.9* 3.6   A:  Shocked liver, improving transaminases. P:   NPO as intubated TF  HEMATOLOGIC  Lab 09/29/11 0500 09/28/11 1049 09/28/11 0355 09/27/11 0600 09/27/11 0205 09/27/11 0159 09/26/11 2346 09/26/11 1802 09/26/11 1524  HGB 9.8* -- 12.2 14.4 -- 15.0 15.6* -- --  HCT 29.7* -- 35.9* 43.6 -- 44.0 46.0 -- --  PLT 185 -- 243 318 -- -- -- 277 204  INR -- 1.26 -- -- 3.53* -- -- 2.96* 2.71*  APTT -- -- -- -- 59* -- -- 39* --   A:  Coagulopathy (Coumadin induced, exacerbated by hypothermia) - resolved. Anemia, no overt hemorrhage. P: Trend CBC Hold Coumadin  INFECTIOUS  Lab 09/29/11 0500 09/28/11 0355 09/27/11 0600 09/27/11 0349 09/26/11 1802 09/26/11 1524  WBC 10.7* 12.5* 18.7* -- 24.4* 9.3  PROCALCITON -- 6.68 -- 6.28 -- --   Cultures: 9/12  Blood >>> ntd 9/12  Urine >>> E.Coli 9/12  Respiratory >>> G-R, G+C 9/12  C.diff PCR >>> neg  Antibiotics: 9/12  Levaquin (empirical, G-) >>> 9/12  Flagyl (empirical, anaerobes, C.diff) >>>  A:  UTI.  ? Legionella pneumonia (pulmonary infiltrates / diarrhea). P: ABx / Cx as above Urine Legionella Ag  ENDOCRINE  Lab 09/29/11 0728 09/29/11 0401 09/28/11 2350 09/28/11 1917 09/28/11 1556  GLUCAP 159* 163* 150* 134* 133*   A:  Hyperglycemia.  Presumed adrenal insufficiency. P:   Decrease Hydrocortisone 50 to q12h SSI Lantus  NEUROLOGIC  A:  Acute encephalopathy, improving.  Significant anxiety component. P:   Start Precedex gtt Fentanyl / Versed PRN  BEST PRACTICES / DISPO ICU status Full  code TF Protonix SCDs Skin intact Family updated at bedside  The patient is critically ill with multiple organ systems failure and requires high complexity decision making for assessment and support, frequent evaluation and titration of therapies, application of advanced monitoring technologies and extensive interpretation of multiple databases. Critical Care Time devoted to patient care services described in this note is 45 minutes.  Lonia Farber, M.D. Pulmonary and Critical Care Medicine [facility] Pager: [phone]  09/29/2011, 9:35 AM

## 2011-09-29 NOTE — Progress Notes (Addendum)
ANTICOAGULATION CONSULT NOTE - Follow Up Consult  Pharmacy Consult for heparin Indication: NSTEMI/ atrial fibrillation  Labs:  Basename 09/29/11 0925 09/29/11 0500 09/28/11 2215 09/28/11 1500 09/28/11 1049 09/28/11 0355 09/27/11 0600 09/27/11 0556 09/27/11 0205 09/27/11 0002 09/26/11 1808 09/26/11 1802  HGB -- 9.8* -- -- -- 12.2 -- -- -- -- -- --  HCT -- 29.7* -- -- -- 35.9* 43.6 -- -- -- -- --  PLT -- 185 -- -- -- 243 318 -- -- -- -- --  APTT -- -- -- -- -- -- -- -- 59* -- -- 39*  LABPROT -- -- -- -- 16.1* -- -- -- 35.9* -- -- 31.3*  INR -- -- -- -- 1.26 -- -- -- 3.53* -- -- 2.96*  HEPARINUNFRC 0.31 -- 0.11* -- -- -- -- -- -- -- -- --  CREATININE -- 0.86 -- 0.78 -- 0.64 -- -- -- -- -- --  CKTOTAL -- -- -- -- -- -- -- 1385* -- -- -- --  CKMB -- -- -- -- -- -- -- 60.6* -- -- -- --  TROPONINI -- -- -- -- -- -- -- 5.77* -- 6.94* 2.19* --    Assessment: 76yo female on heparin for NSTEMI and initial dosing for Afib/ low INR after hypothermia D/C'd and coumadin on hold. Heparin rate currently running at 1150units/hr with a therapeutic heparin level of 0.3units/ml. Hgb dropped to 9.8 today from 12.2 yesterday. Plts also dropped 185<243. No report of bleeding   Goal of Therapy:  Heparin level 0.3-0.7 units/ml   Plan:  1) Continue heparin 1150units/hr 2) Will order 8hr heparin level 3) Monitor for signs of bleeding   Franchot Erichsen, Pharm.D. Clinical Pharmacist   Pager: [phone] 09/29/2011 10:53 AM

## 2011-09-30 ENCOUNTER — Inpatient Hospital Stay (HOSPITAL_COMMUNITY): Payer: Medicare Other

## 2011-09-30 DIAGNOSIS — I469 Cardiac arrest, cause unspecified: Secondary | ICD-10-CM

## 2011-09-30 DIAGNOSIS — I214 Non-ST elevation (NSTEMI) myocardial infarction: Secondary | ICD-10-CM

## 2011-09-30 DIAGNOSIS — J9600 Acute respiratory failure, unspecified whether with hypoxia or hypercapnia: Secondary | ICD-10-CM

## 2011-09-30 LAB — URINE CULTURE: Colony Count: 75000

## 2011-09-30 LAB — PROTIME-INR
INR: 2.42 — ABNORMAL HIGH (ref 0.00–1.49)
Prothrombin Time: 26.7 seconds — ABNORMAL HIGH (ref 11.6–15.2)

## 2011-09-30 LAB — CBC
MCHC: 32.6 g/dL (ref 30.0–36.0)
Platelets: 174 10*3/uL (ref 150–400)
RDW: 15 % (ref 11.5–15.5)
WBC: 14.9 10*3/uL — ABNORMAL HIGH (ref 4.0–10.5)

## 2011-09-30 LAB — GLUCOSE, CAPILLARY: Glucose-Capillary: 120 mg/dL — ABNORMAL HIGH (ref 70–99)

## 2011-09-30 LAB — BASIC METABOLIC PANEL
Chloride: 111 mEq/L (ref 96–112)
Creatinine, Ser: 0.65 mg/dL (ref 0.50–1.10)
GFR calc Af Amer: >90 mL/min (ref >90)
GFR calc non Af Amer: 85 mL/min — ABNORMAL LOW (ref >90)
Potassium: 3.4 mEq/L — ABNORMAL LOW (ref 3.5–5.1)

## 2011-09-30 MED ORDER — POTASSIUM CHLORIDE 20 MEQ/15ML (10%) PO LIQD
ORAL | Status: AC
  Filled 2011-09-30: qty 30

## 2011-09-30 MED ORDER — FENTANYL CITRATE 0.05 MG/ML IJ SOLN
50.0000 ug/h | INTRAMUSCULAR | Status: DC
  Administered 2011-09-30: 100 ug/h via INTRAVENOUS
  Administered 2011-10-01: 150 ug/h via INTRAVENOUS
  Administered 2011-10-02: 75 ug/h via INTRAVENOUS
  Filled 2011-09-30 (×3): qty 50

## 2011-09-30 MED ORDER — MIDAZOLAM HCL 2 MG/2ML IJ SOLN
2.0000 mg | INTRAMUSCULAR | Status: DC | PRN
  Administered 2011-09-30 (×2): 2 mg via INTRAVENOUS
  Administered 2011-10-01 (×3): 4 mg via INTRAVENOUS
  Filled 2011-09-30 (×3): qty 4
  Filled 2011-09-30 (×2): qty 2

## 2011-09-30 MED ORDER — FENTANYL CITRATE 0.05 MG/ML IJ SOLN
25.0000 ug | INTRAMUSCULAR | Status: DC | PRN
  Administered 2011-09-30 – 2011-10-04 (×12): 100 ug via INTRAVENOUS
  Administered 2011-10-04: 50 ug via INTRAVENOUS
  Administered 2011-10-04 – 2011-10-05 (×5): 100 ug via INTRAVENOUS
  Administered 2011-10-06 – 2011-10-07 (×2): 200 ug via INTRAVENOUS
  Filled 2011-09-30 (×2): qty 4
  Filled 2011-09-30: qty 2
  Filled 2011-09-30: qty 4
  Filled 2011-09-30 (×6): qty 2

## 2011-09-30 MED ORDER — POTASSIUM CHLORIDE 20 MEQ/15ML (10%) PO LIQD
40.0000 meq | Freq: Once | ORAL | Status: AC
  Administered 2011-09-30: 40 meq
  Filled 2011-09-30: qty 30

## 2011-09-30 MED ORDER — FENTANYL BOLUS VIA INFUSION
50.0000 ug | Freq: Four times a day (QID) | INTRAVENOUS | Status: DC | PRN
  Filled 2011-09-30: qty 100

## 2011-09-30 NOTE — Progress Notes (Signed)
Patient Name: Alicia Guerrero      SUBJECTIVE:   She is awake and responds to voice with head turning but not to command  Past Medical History  Diagnosis Date  . Left bundle branch block   . Atrial fibrillation   . Acute myocardial infarction, unspecified site, episode of care unspecified   . Coronary atherosclerosis   . Pure hypercholesterolemia   . Unspecified essential hypertension   . Cough   . Other atopic dermatitis and related conditions   . Acute upper respiratory infections of unspecified site   . Allergic rhinitis due to pollen   . Unspecified asthma   . Other and unspecified ovarian cyst   . Personal history of malignant neoplasm of breast   . Personal history of hyperthyroidism   . Chronic eczema     hands  . Hx of coronary angioplasty     percutaneous transluminal  . Hemorrhoids   . Cancer     PHYSICAL EXAM Filed Vitals:   09/30/11 0612 09/30/11 0700 09/30/11 0723 09/30/11 0728  BP:      Pulse: 63 55 72   Temp:    99.2 F (37.3 C)  TempSrc:    Core (Comment)  Resp: 24 18 27    Height:      Weight:      SpO2: 95% 95% 95%    Temp (24hrs), Avg:99.3 F (37.4 C), Min:99 F (37.2 C), Max:99.8 F (37.7 C)   General appearance: sedated but arousable to voice Intubated Moving arms Lungs: good air movement Heart: regular rate and rhythm, S1, S2 normal, no murmur, click, rub or gallop Extremities: extremities normal, atraumatic, no cyanosis or edema Pulses: 2+ and symmetric Skin: Skin color, texture, turgor normal. No rashes or lesions Neurologic: as abpve  TELEMETRY: Reviewed telemetry pt in sinus:    Intake/Output Summary (Last 24 hours) at 09/30/11 0820 Last data filed at 09/30/11 0700  Gross per 24 hour  Intake 1832.61 ml  Output   1285 ml  Net 547.61 ml    LABS: Basic Metabolic Panel:  Lab 09/30/11 1610 09/29/11 0500 09/28/11 1500 09/28/11 0355 09/27/11 2230 09/27/11 1753 09/27/11 1425 09/27/11 0600  NA 143 145 143 143 143 144 141 --    K 3.4* 3.7 3.1* 4.3 3.8 3.6 4.3 --  CL 111 110 105 107 108 110 107 --  CO2 24 24 27 26 24 24 24  --  GLUCOSE 131* 177* 149* 179* 122* 249* 376* --  BUN 20 22 13 12 11 13 13  --  CREATININE 0.65 0.86 0.78 0.64 0.53 0.57 0.60 --  CALCIUM 7.6* 7.4* -- -- -- -- -- --  MG -- -- -- 1.7 -- -- -- 1.9  PHOS -- -- -- 2.4 -- -- 2.2* --   Cardiac Enzymes: No results found for this basename: CKTOTAL:3,CKMB:3,CKMBINDEX:3,TROPONINI:3 in the last 72 hours CBC:  Lab 09/30/11 0500 09/29/11 0500 09/28/11 0355 09/27/11 0600 09/27/11 0159 09/26/11 2346 09/26/11 2153 09/26/11 1802 09/26/11 1524  WBC 14.9* 10.7* 12.5* 18.7* -- -- -- 24.4* 9.3  NEUTROABS -- -- -- -- -- -- -- 21.9* --  HGB 9.2* 9.8* 12.2 14.4 15.0 15.6* 16.3* -- --  HCT 28.2* 29.7* 35.9* 43.6 44.0 46.0 48.0* -- --  MCV 80.3 79.8 78.0 81.2 -- -- -- 81.6 84.1  PLT 174 185 243 318 -- -- -- 277 204   PROTIME:  Basename 09/29/11 0925 09/28/11 1049  LABPROT 35.7* 16.1*  INR 3.51* 1.26   Liver Function Tests:  Basename 09/28/11 0355  AST 93*  ALT 120*  ALKPHOS 73  BILITOT 0.7  PROT 5.3*  ALBUMIN 2.3*   No results found for this basename: LIPASE:2,AMYLASE:2 in the last 72 hours BNP: BNP (last 3 results)  Basename 09/27/11 0349 09/26/11 1808  PROBNP 544.8* 116.0      ASSESSMENT AND PLAN:  Patient Active Hospital Problem List: HYPERTENSION (01/01/2007)   CORONARY ARTERY DISEASE (01/01/2007)   Cardiac arrest - ventricular fibrillation (09/26/2011)   Acute respiratory failure (09/26/2011)   Shock (09/26/2011)     Acute non-ST segment elevation myocardial infarction (09/26/2011)   Anemia-- follow carefully  Fever-resolved Anticipate cath as fever and hemodyanmic stabilize ASa Begin heparin  Signed, Sherryl Manges MD  09/30/2011

## 2011-09-30 NOTE — Progress Notes (Signed)
ANTICOAGULATION CONSULT NOTE - Initial Consult  Pharmacy Consult for anticoagulation Indication: atrial fibrillation/ACS  Assessment: 76 YOF on chronic coumadin for Afib, s/p VT arrest on admission, coumadin has been on hold since admission, but INR continue to be elevated 2.42 << 3.5, likely d/t shock liver. Pt. Started IV heparin on 9/12 for ACS (INR = 1.26 that day most likely d/t a lab error), but d/c'd on 9/13 due to INR elevation. INR is trending down. Discussed with Dr. Graciela Husbands on the phone, will hold of IV heparin or Lovenox for now, and reassess later for needs of anticoagulation.   Plan:  - Re-check INR tomorrow - F/U plans of anticoagulation when INR < 2 for ?ACS vs. Afib  Bayard Hugger, PharmD, BCPS  Clinical Pharmacist  Pager: [phone]  09/30/2011,10:45 AM

## 2011-09-30 NOTE — Progress Notes (Signed)
Agitation  Requiring frequent fentanyl boluses   Plan: start continuous sedation with Fentanyl, intermittent Versed.

## 2011-09-30 NOTE — Progress Notes (Signed)
Name: Alicia Guerrero MRN: [id number] DOB: 15-Feb-1935    LOS: 4 Date of admit : 09/26/2011 PCP is Illene Regulus, MD   Referring Provider:  [facility] Reason for Referral:  Cardiac arrest  PULMONARY / CRITICAL CARE MEDICINE  Patient summary:  76 yo with extensive cardiac history brought to [facility] ED after VT/VF arrest.  Transferred to [facility] for hypothermia protocol.  Events Since Admission: 9/10  VT/VF arrest, intubated, hypothermia initiated 9/11  Hypothermia stopped as multiple arrhythmias and coagulopathy 9/13:   Off pressors.  RN reports significant anxiety / agitation off sedation.   SUBJECTIVE/OVERNIGHT/INTERVAL HX  - Follows commands periodically per RN. Off pressors. Met SBT criteria but failed it instantaneously; became tachypneic  - Never needed precedex    Vital Signs: Temp:  [99 F (37.2 C)-99.8 F (37.7 C)] 99.2 F (37.3 C) (09/14 1157) Pulse Rate:  [55-87] 60  (09/14 0924) Resp:  [17-29] 24  (09/14 0924) BP: (121-152)/(53-91) 122/55 mmHg (09/14 0924) SpO2:  [91 %-96 %] 96 % (09/14 0924) FiO2 (%):  [40 %-50 %] 40 % (09/14 1139) Weight:  [87 kg (191 lb 12.8 oz)] 87 kg (191 lb 12.8 oz) (09/14 0443)  Physical Examination: General:  Intubated, mechanically ventilated Neuro:  Nonfocal, follows some commands per RN.Alert, Tracks HEENT: PERRL Neck:  No JVD Cardiovascular:  RRR, no murmurs Lungs:  Bilateral air entry, few rales Abdomen:  Soft, bowel sounds present Musculoskeletal:  No edema Skin:  Intact.  Active Problems:  HYPERTENSION  CORONARY ARTERY DISEASE  Cardiac arrest - ventricular fibrillation  Acute respiratory failure  Shock  Altered mental status  Acute non-ST segment elevation myocardial infarction  Ct Angio Chest Pe W/cm &/or Wo Cm  09/29/2011  *RADIOLOGY REPORT*  Clinical Data: Cardiac arrest.  Evaluate for possible pulmonary embolism.  Ventilated patient.  CT ANGIOGRAPHY CHEST  Technique:  Multidetector CT imaging of the chest using the standard  protocol during bolus administration of intravenous contrast. Multiplanar reconstructed images including MIPs were obtained and reviewed to evaluate the vascular anatomy.  Contrast: OMNIPAQUE IOHEXOL 350 MG/ML SOLN  Comparison: No priors.  Findings:  Mediastinum: Study is slightly limited by patient respiratory motion.  However, there is no evidence to suggest clinically relevant central, lobar or segmental sized pulmonary embolism.  In portions of the lungs, smaller subsegmental sized pulmonary embolism cannot be excluded secondary to motion. Heart size is mildly enlarged with left ventricular dilatation.  There is some rounding of the left ventricular apex. There is atherosclerosis of the thoracic aorta, the great vessels of the mediastinum and the coronary arteries, including calcified atherosclerotic plaque in the left main, left anterior descending, left circumflex and right coronary arteries. Calcifications of the mitral annulus.  Numerous borderline enlarged mediastinal and bilateral hilar lymph nodes are noted.  The patient is intubated with tip of endotracheal tube at the level of the inferior aspect of the aortic arch.  Nasogastric tube is seen extending into the stomach (tip extends below the lower margin of the image).  Lungs/Pleura: There are bilateral pleural effusions, moderate on the right and small on the left, which layer dependently.  There are areas of airspace consolidation and volume loss scattered throughout the lungs bilaterally, predominately dependently located, with relative sparing of the right middle lobe.  There are a few scattered pulmonary nodules, the largest of which is in the inferior segment of the lingula (image 63 of series 5) measuring 8 mm. No pneumothorax.  Upper Abdomen: Unremarkable.  Musculoskeletal: Multiple nondisplaced and  minimally displaced bilateral anterolateral rib fractures are noted, likely related to recent resuscitation attempts.  Specifically, the right  second, third, fourth, fifth and sixth ribs, as well as the left second (mildly displaced), third and fourth ribs.  There also appears to be an oblique nondisplaced sternal fracture best demonstrated on sagittal image 79 of series 9.  IMPRESSION:  1.  Despite the mild limitations of this examination there is no evidence to suggest clinically relevant central, lobar or segmental sized pulmonary embolism. 2.  There is however widespread air space disease and volume loss throughout the lungs bilaterally, as above, predominately dependently located, suspicious for a large volume aspiration with developing pneumonia. 3.  The moderate right and small left-sided pleural effusions layering dependently. 4.  Numerous bilateral anterolateral rib fractures, the majority of which are nondisplaced, as above. 5. Atherosclerosis, including left main and three-vessel coronary artery disease.  In addition, there is mild cardiomegaly with dilatation of the left ventricle and rounding of the left ventricular apex. 6.  Support apparatus, as above.   Original Report Authenticated By: Florencia Reasons, M.D.    Dg Chest Port 1 View  09/30/2011  *RADIOLOGY REPORT*  Clinical Data: Edema.  Respiratory difficulty.  PORTABLE CHEST - 1 VIEW  Comparison: Yesterday  Findings: Endotracheal tube, NG tube, left internal jugular vein center venous catheter are stable.  Diffuse consolidation throughout the left lung worse.  Focal consolidation in the right upper lobe and central right lower lung zone are worse.  No pneumothorax.  IMPRESSION: Worsening bilateral airspace disease.   Original Report Authenticated By: Donavan Burnet, M.D.    Dg Chest Port 1 View  09/29/2011  *RADIOLOGY REPORT*  Clinical Data: Intubation, evaluate endotracheal tube position  PORTABLE CHEST - 1 VIEW  Comparison: Portable chest x-ray of 09/28/2011  Findings: The tip of the endotracheal tube is approximately 1.9 cm above the carina.  The there has been an increase in  opacity at the left lung base which may represent atelectasis or pneumonia and there does appear to be worsening of pulmonary vascular congestion. Mild cardiomegaly is stable.  Left IJ central venous line is unchanged in position.  IMPRESSION:  1.  Tip of endotracheal tube 1.9 cm above the carina. 2.  Increasing opacity at the left lung base.  Possible atelectasis or effusion but cannot exclude pneumonia. 3.  Possible edema as well.   Original Report Authenticated By: Juline Patch, M.D.      ASSESSMENT AND PLAN  PULMONARY  Lab 09/28/11 0352 09/27/11 2013 09/27/11 1353 09/27/11 1217 09/27/11 1046  PHART 7.479* 7.341* 7.310* 7.251* 7.163*  PCO2ART 34.5* 44.9 37.2 39.1 44.4  PO2ART 69.0* 113.0* 72.0* 48.0* 53.0*  HCO3 25.5* 24.3* 19.3* 17.9* 16.8*  O2SAT 94.0 98.0 95.0 84.0 86.0   Ventilator Settings: Vent Mode:  [-] PRVC FiO2 (%):  [40 %-50 %] 40 % Set Rate:  [24 bmp] 24 bmp Vt Set:  [500 mL] 500 mL PEEP:  [5 cmH20] 5 cmH20 Plateau Pressure:  [18 cmH20-30 cmH20] 25 cmH20  CXR:  9/11 >>> Hardware in good position, improved aeration, small left effusion ETT:  9/10>>>  A:  Acute respiratory failure post cardiac arrest. CT 9/13 - neg for PE  - on 09/30/2011: failed SBT probably due to consolidative lung proces  P:   Full vent support SBT daily   CARDIOVASCULAR  Lab 09/28/11 0436 09/27/11 1059 09/27/11 0556 09/27/11 0349 09/27/11 0348 09/27/11 0002 09/26/11 1935 09/26/11 1808 09/26/11 1524  TROPONINI -- --  5.77* -- -- 6.94* -- 2.19* <0.30  LATICACIDVEN 3.7* 6.5* -- -- 5.5* -- 5.1* -- --  PROBNP -- -- -- 544.8* -- -- -- 116.0 --   TTE:  9/10 >>> EF 45%, severe hypokinesis of inferior myocardium consistent with MI. ECG:  9/11 >>> LBBB Lines: L IJ TLC 9/10 >>  A: VT/VF arrest. NSTEMI.  Arrhythmias.  Shock, likely cardiogenic, resolved, off pressors.  -on 09/30/11: Not on pressors P:  Heparin gtt per Cardiology recommendations after checking INR  low dose beta blocker -  Metoprolol 2.5 IV q6h Hold ACE for now ASA Statin contraindicated (elevated liver enzymes) Out of window for emergent cardiac cath, but possibly  elective procedure before discharge Goal MAP 60-65  RENAL  Lab 09/30/11 0500 09/29/11 0500 09/28/11 1500 09/28/11 0355 09/27/11 2230 09/27/11 1425 09/27/11 0600 09/26/11 1802 09/26/11 1524  NA 143 145 143 143 143 -- -- -- --  K 3.4* 3.7 -- -- -- -- -- -- --  CL 111 110 105 107 108 -- -- -- --  CO2 24 24 27 26 24  -- -- -- --  BUN 20 22 13 12 11  -- -- -- --  CREATININE 0.65 0.86 0.78 0.64 0.53 -- -- -- --  CALCIUM 7.6* 7.4* 7.3* 6.9* 7.3* -- -- -- --  MG -- -- -- 1.7 -- -- 1.9 1.9 2.6*  PHOS -- -- -- 2.4 -- 2.2* 0.6* 3.8 7.2*   Intake/Output      09/13 0701 - 09/14 0700 09/14 0701 - 09/15 0700   I.V. (mL/kg) 721.2 (8.3)    NG/GT 730    IV Piggyback 460    Total Intake(mL/kg) 1911.2 (22)    Urine (mL/kg/hr) 1310 (0.6)    Emesis/NG output     Stool 100    Total Output 1410    Net +501.2         Stool Occurrence 3 x     Foley:  9/10 >>>  A: Normal renal function.  Hypokalemia. P:   Replete K Trend BMP Check mag and phos  GASTROINTESTINAL  Lab 09/28/11 0355 09/27/11 0349 09/26/11 1802  AST 93* 253* 293*  ALT 120* 214* 281*  ALKPHOS 73 80 111  BILITOT 0.7 0.8 0.9  PROT 5.3* 6.0 6.9  ALBUMIN 2.3* 2.9* 3.6   A:  Shocked liver, improving transaminases. P:   NPO as intubated TF  HEMATOLOGIC  Lab 09/30/11 0935 09/30/11 0500 09/29/11 0925 09/29/11 0500 09/28/11 1049 09/28/11 0355 09/27/11 0600 09/27/11 0205 09/27/11 0159 09/26/11 1802  HGB -- 9.2* -- 9.8* -- 12.2 14.4 -- 15.0 --  HCT -- 28.2* -- 29.7* -- 35.9* 43.6 -- 44.0 --  PLT -- 174 -- 185 -- 243 318 -- -- 277  INR 2.42* -- 3.51* -- 1.26 -- -- 3.53* -- 2.96*  APTT -- -- -- -- -- -- -- 59* -- 39*   A:  Coagulopathy (Coumadin induced, exacerbated by hypothermia) - resolved. Anemia, no overt hemorrhage. P: Trend CBC PRBC for hgg < 8gm% due to MI Hold  Coumadin  INFECTIOUS  Lab 09/30/11 0500 09/29/11 0500 09/28/11 0355 09/27/11 0600 09/27/11 0349 09/26/11 1802  WBC 14.9* 10.7* 12.5* 18.7* -- 24.4*  PROCALCITON -- -- 6.68 -- 6.28 --   Cultures: 9/12  Blood >>> ntd 9/12  Urine >>> E.Coli 9/12  Respiratory >>> G-R, G+C 9/12  C.diff PCR >>> neg  Antibiotics: 9/12  Levaquin (empirical, G-) >>> 9/12  Flagyl (empirical, anaerobes, C.diff) >>>  A:  UTI.  ?  Legionella pneumonia (pulmonary infiltrates / diarrhea).  - n 09/30/11: no fever P: ABx / Cx as above Urine Legionella Ag  ENDOCRINE  Lab 09/30/11 1155 09/30/11 0726 09/30/11 0346 09/30/11 0021 09/29/11 2045  GLUCAP 120* 123* 147* 131* 129*   A:  Hyperglycemia.  Presumed relative adrenal insufficiency. P:   DC Hydrocortisone  SSI Lantus  NEUROLOGIC  A:  Acute encephalopathy, improving.  Significant anxiety component.  - on 9'14/13: Follows some commands. Did not need precedex P:   DC precedex order (never started) Fent prn DC versed  BEST PRACTICES / DISPO ICU status Full code TF Protonix SCDs Skin intact Family not at bedsiide 09/30/2011   The patient is critically ill with multiple organ systems failure and requires high complexity decision making for assessment and support, frequent evaluation and titration of therapies, application of advanced monitoring technologies and extensive interpretation of multiple databases. Critical Care Time devoted to patient care services described in this note is 45 minutes.   Dr. Kalman Shan, M.D., [facility].C.P Pulmonary and Critical Care Medicine Staff Physician [facility] [place] [facility] Pager: [phone], If no answer or between  15:00h - 7:00h: call [phone]  09/30/2011 12:20 PM

## 2011-10-01 ENCOUNTER — Inpatient Hospital Stay (HOSPITAL_COMMUNITY): Payer: Medicare Other

## 2011-10-01 DIAGNOSIS — R001 Bradycardia, unspecified: Secondary | ICD-10-CM

## 2011-10-01 DIAGNOSIS — E876 Hypokalemia: Secondary | ICD-10-CM | POA: Diagnosis present

## 2011-10-01 LAB — CBC
HCT: 32.5 % — ABNORMAL LOW (ref 36.0–46.0)
Hemoglobin: 10.5 g/dL — ABNORMAL LOW (ref 12.0–15.0)
MCHC: 32.3 g/dL (ref 30.0–36.0)
MCV: 81.3 fL (ref 78.0–100.0)
RDW: 15.1 % (ref 11.5–15.5)
WBC: 10 10*3/uL (ref 4.0–10.5)

## 2011-10-01 LAB — CULTURE, RESPIRATORY W GRAM STAIN

## 2011-10-01 LAB — PROTIME-INR
INR: 1.77 — ABNORMAL HIGH (ref 0.00–1.49)
Prothrombin Time: 20.9 seconds — ABNORMAL HIGH (ref 11.6–15.2)

## 2011-10-01 LAB — HEPARIN LEVEL (UNFRACTIONATED): Heparin Unfractionated: <0.1 IU/mL — ABNORMAL LOW (ref 0.30–0.70)

## 2011-10-01 LAB — BASIC METABOLIC PANEL
BUN: 22 mg/dL (ref 6–23)
Chloride: 117 mEq/L — ABNORMAL HIGH (ref 96–112)
Creatinine, Ser: 0.56 mg/dL (ref 0.50–1.10)
GFR calc Af Amer: >90 mL/min (ref >90)
Glucose, Bld: 126 mg/dL — ABNORMAL HIGH (ref 70–99)

## 2011-10-01 LAB — GLUCOSE, CAPILLARY
Glucose-Capillary: 147 mg/dL — ABNORMAL HIGH (ref 70–99)
Glucose-Capillary: 159 mg/dL — ABNORMAL HIGH (ref 70–99)
Glucose-Capillary: 160 mg/dL — ABNORMAL HIGH (ref 70–99)
Glucose-Capillary: 147 mg/dL — ABNORMAL HIGH (ref 70–99)

## 2011-10-01 MED ORDER — POTASSIUM CHLORIDE 20 MEQ/15ML (10%) PO LIQD
ORAL | Status: AC
  Filled 2011-10-01: qty 30

## 2011-10-01 MED ORDER — POTASSIUM PHOSPHATE DIBASIC 3 MMOLE/ML IV SOLN
15.0000 mmol | Freq: Once | INTRAVENOUS | Status: AC
  Administered 2011-10-01: 15 mmol via INTRAVENOUS
  Filled 2011-10-01: qty 5

## 2011-10-01 MED ORDER — POTASSIUM CHLORIDE 20 MEQ/15ML (10%) PO LIQD
40.0000 meq | Freq: Two times a day (BID) | ORAL | Status: AC
  Administered 2011-10-01 (×2): 40 meq
  Filled 2011-10-01 (×2): qty 30

## 2011-10-01 MED ORDER — HEPARIN (PORCINE) IN NACL 100-0.45 UNIT/ML-% IJ SOLN
1800.0000 [IU]/h | INTRAMUSCULAR | Status: AC
  Administered 2011-10-01: 1400 [IU]/h via INTRAVENOUS
  Administered 2011-10-01: 1200 [IU]/h via INTRAVENOUS
  Administered 2011-10-02: 1650 [IU]/h via INTRAVENOUS
  Administered 2011-10-02: 1400 [IU]/h via INTRAVENOUS
  Administered 2011-10-03 – 2011-10-04 (×3): 1800 [IU]/h via INTRAVENOUS
  Filled 2011-10-01 (×10): qty 250

## 2011-10-01 MED ORDER — POTASSIUM CHLORIDE 20 MEQ/15ML (10%) PO LIQD
40.0000 meq | Freq: Once | ORAL | Status: AC
  Administered 2011-10-01: 40 meq
  Filled 2011-10-01: qty 30

## 2011-10-01 MED ORDER — DEXMEDETOMIDINE HCL IN NACL 200 MCG/50ML IV SOLN
0.2000 ug/kg/h | INTRAVENOUS | Status: DC
  Administered 2011-10-01 (×2): 0.3 ug/kg/h via INTRAVENOUS
  Administered 2011-10-01: 0.2 ug/kg/h via INTRAVENOUS
  Administered 2011-10-02: 0.3 ug/kg/h via INTRAVENOUS
  Administered 2011-10-02: 1 ug/kg/h via INTRAVENOUS
  Administered 2011-10-02: 0.2 ug/kg/h via INTRAVENOUS
  Filled 2011-10-01 (×6): qty 50

## 2011-10-01 NOTE — Progress Notes (Signed)
ANTICOAGULATION CONSULT NOTE - Follow Up Consult  Pharmacy Consult for Heparin Indication: chest pain/ACS/Afib  Allergies  Allergen Reactions  . Meperidine Hcl Other (See Comments)    Mixed with phenergan swelling of the mouth   . Penicillins Other (See Comments)    Unknown reaction  . Molds & Smuts Other (See Comments)    Runny nose  . Tape Other (See Comments)    Redness, pulls skin off    Patient Measurements: Height: 5' 3.5" (161.3 cm) Weight: 194 lb 3.6 oz (88.1 kg) IBW/kg (Calculated) : 53.55  Heparin Dosing Weight: 73 kg  Vital Signs: Temp: 98.5 F (36.9 C) (09/15 1600) Temp src: Core (Comment) (09/15 0800) BP: 114/60 mmHg (09/15 1715) Pulse Rate: 44  (09/15 1715)  Labs:  Basename 10/01/11 1700 10/01/11 0423 09/30/11 0935 09/30/11 0500 09/29/11 0925 09/29/11 0500 09/28/11 2215  HGB -- 10.5* -- 9.2* -- -- --  HCT -- 32.5* -- 28.2* -- 29.7* --  PLT -- 172 -- 174 -- 185 --  APTT -- -- -- -- -- -- --  LABPROT -- 20.9* 26.7* -- 35.7* -- --  INR -- 1.77* 2.42* -- 3.51* -- --  HEPARINUNFRC <0.10* -- -- -- 0.31 -- 0.11*  CREATININE -- 0.56 -- 0.65 -- 0.86 --  CKTOTAL -- -- -- -- -- -- --  CKMB -- -- -- -- -- -- --  TROPONINI -- -- -- -- -- -- --    Estimated Creatinine Clearance: 65.6 ml/min (by C-G formula based on Cr of 0.56).   Medications:  Infusions:    . sodium chloride Stopped (10/01/11 0839)  . sodium chloride 20 mL/hr at 10/01/11 0452  . dexmedetomidine 0.3 mcg/kg/hr (10/01/11 1321)  . fentaNYL infusion INTRAVENOUS 150 mcg/hr (10/01/11 1247)  . heparin 1,200 Units/hr (10/01/11 1610)    Assessment: 76 yo female on heparin for NSTEMI s/p VT arrest and hypothermia. Coumadin for Afib on hold. Heparin level is subtherapeutic. Spoke with RN and no issues with heparin infusion. No bleeding noted.  Goal of Therapy:  Heparin level 0.3-0.7 units/ml Monitor platelets by anticoagulation protocol: Yes   Plan:  -Increase heparin infusion to 1400  units/hr -Heparin level 8 hours after rate change -Daily heparin level and CBC while on heparin   [facility], [place].D., BCPS Clinical Pharmacist Pager: [phone] 10/01/2011 6:23 PM

## 2011-10-01 NOTE — Progress Notes (Addendum)
Patient Name: Alicia Guerrero      SUBJECTIVE:admitted with VF arrest with new WMA, old but intermittent LBBB, PAF and Echo>>45% with persistent respiratory failure.  +Tn in setting of cardiac arrest, as well as profound but now corrected hypokalemia  Received BB yday with some slowing of HR; Vent support without change overnight  50%FiO2  Past Medical History  Diagnosis Date  . Left bundle branch block   . Atrial fibrillation   . Acute myocardial infarction, unspecified site, episode of care unspecified   . Coronary atherosclerosis   . Pure hypercholesterolemia   . Unspecified essential hypertension   . Cough   . Other atopic dermatitis and related conditions   . Acute upper respiratory infections of unspecified site   . Allergic rhinitis due to pollen   . Unspecified asthma   . Other and unspecified ovarian cyst   . Personal history of malignant neoplasm of breast   . Personal history of hyperthyroidism   . Chronic eczema     hands  . Hx of coronary angioplasty     percutaneous transluminal  . Hemorrhoids   . Cancer     PHYSICAL EXAM  Temp (24hrs), Avg:98.5 F (36.9 C), Min:98 F (36.7 C), Max:99.2 F (37.3 C)    Well developed and nourished intubated responds to voice and obeys commands!!!!! HENT normal Neck supple   Clear-ronchi Regular rate and rhythm, no murmurs or gallops Abd-soft with active BS No Clubbing cyanosis edema Skin-warm and dry cap refill<2 A   Grossly normal sensory and motor function  TELEMETRY: Reviewed telemetry pt in sinus with some brayd and non sustained VT   Intake/Output Summary (Last 24 hours) at 10/01/11 0758 Last data filed at 10/01/11 0600  Gross per 24 hour  Intake 2392.08 ml  Output   1195 ml  Net 1197.08 ml    LABS: Basic Metabolic Panel:  Lab 10/01/11 1610 09/30/11 0500 09/29/11 0500 09/28/11 1500 09/28/11 0355 09/27/11 2230 09/27/11 1753  NA 149* 143 145 143 143 143 144  K 3.4* 3.4* 3.7 3.1* 4.3 3.8 3.6  CL 117*  111 110 105 107 108 110  CO2 24 24 24 27 26 24 24   GLUCOSE 126* 131* 177* 149* 179* 122* 249*  BUN 22 20 22 13 12 11 13   CREATININE 0.56 0.65 0.86 0.78 0.64 0.53 0.57  CALCIUM 8.2* 7.6* -- -- -- -- --  MG 2.5 -- -- -- 1.7 -- --  PHOS 2.0* -- -- -- 2.4 -- --   Cardiac Enzymes: No results found for this basename: CKTOTAL:3,CKMB:3,CKMBINDEX:3,TROPONINI:3 in the last 72 hours CBC:  Lab 10/01/11 0423 09/30/11 0500 09/29/11 0500 09/28/11 0355 09/27/11 0600 09/27/11 0159 09/26/11 2346 09/26/11 1802 09/26/11 1524  WBC 10.0 14.9* 10.7* 12.5* 18.7* -- -- 24.4* 9.3  NEUTROABS -- -- -- -- -- -- -- 21.9* --  HGB 10.5* 9.2* 9.8* 12.2 14.4 15.0 15.6* -- --  HCT 32.5* 28.2* 29.7* 35.9* 43.6 44.0 46.0 -- --  MCV 81.3 80.3 79.8 78.0 81.2 -- -- 81.6 84.1  PLT 172 174 185 243 318 -- -- 277 204   PROTIME:  Basename 10/01/11 0423 09/30/11 0935 09/29/11 0925  LABPROT 20.9* 26.7* 35.7*  INR 1.77* 2.42* 3.51*   Liver Function Tests: No results found for this basename: AST:2,ALT:2,ALKPHOS:2,BILITOT:2,PROT:2,ALBUMIN:2 in the last 72 hours No results found for this basename: LIPASE:2,AMYLASE:2 in the last 72 hours BNP: BNP (last 3 results)  Basename 10/01/11 0423 09/27/11 0349 09/26/11 1808  PROBNP 1056.0* 544.8*  116.0    ASSESSMENT AND PLAN:  Patient Active Hospital Problem List: Cardiac arrest - ventricular fibrillation (09/26/2011)   Acute respiratory failure //Pneumonia   Acute non-ST segment elevation myocardial infarction (09/26/2011)   HYPERTENSION (01/01/2007)   CORONARY ARTERY DISEASE (01/01/2007)   Altered mental status (09/26/2011)   Bradycardia  Her elevated BNP and CVP speak to at least RH failure;  With her Na 149, Its hard for me to understand LV failure with EF 45%  I wonder abut the utility of a diuretic but for now I would hold and perhaps recheck BNP in am   Begin heparin (finally) Stop bb 2/2 bradycardia NA/K per CCM Hopefully can begin ace-I in am Continue  asa      Signed, Sherryl Manges MD  10/01/2011

## 2011-10-01 NOTE — Progress Notes (Signed)
Name: Alicia Guerrero MRN: [id number] DOB: Jul 01, 1935    LOS: 5 Date of admit : 09/26/2011 PCP is Illene Regulus, MD   Referring Provider:  [facility] Reason for Referral:  Cardiac arrest  PULMONARY / CRITICAL CARE MEDICINE  Patient summary:  76 yo with extensive cardiac history brought to [facility] ED after VT/VF arrest.  Transferred to [facility] for hypothermia protocol.  Events Since Admission: 9/10  VT/VF arrest, intubated, hypothermia initiated 9/11  Hypothermia stopped as multiple arrhythmias and coagulopathy 9/13:   Off pressors.  RN reports significant anxiety / agitation off sedation. 9/14:  - Follows commands periodically per RN. Off pressors. Met SBT criteria but failed it instantaneously; became tachypneic  - Never needed precedex    SUBJECTIVE/OVERNIGHT/INTERVAL HX 9/15: very agitated overnight . RASS +3 to +4. Needing sedation gtt  And prn versed. On 50% fio2   Vital Signs: Temp:  [98 F (36.7 C)-99.2 F (37.3 C)] 98.5 F (36.9 C) (09/15 0400) Pulse Rate:  [54-81] 56  (09/15 0600) Resp:  [20-26] 20  (09/15 0600) BP: (105-152)/(45-68) 126/54 mmHg (09/15 0600) SpO2:  [88 %-97 %] 94 % (09/15 0600) FiO2 (%):  [40 %-50 %] 50 % (09/15 0600) Weight:  [88.1 kg (194 lb 3.6 oz)] 88.1 kg (194 lb 3.6 oz) (09/15 0449)  Physical Examination: General:  Intubated, mechanically ventilated Neuro:  Follows some commands RASS +3. Awake and tracks HEENT: PERRL Neck:  No JVD Cardiovascular:  RRR, no murmurs Lungs:  Bilateral air entry, few rales Abdomen:  Soft, bowel sounds present Musculoskeletal:  No edema Skin:  Intact.  Active Problems:  HYPERTENSION  CORONARY ARTERY DISEASE  Cardiac arrest - ventricular fibrillation  Acute respiratory failure  Shock  Altered mental status  Acute non-ST segment elevation myocardial infarction  Ct Angio Chest Pe W/cm &/or Wo Cm  09/29/2011  *RADIOLOGY REPORT*  Clinical Data: Cardiac arrest.  Evaluate for possible pulmonary embolism.  Ventilated  patient.  CT ANGIOGRAPHY CHEST  Technique:  Multidetector CT imaging of the chest using the standard protocol during bolus administration of intravenous contrast. Multiplanar reconstructed images including MIPs were obtained and reviewed to evaluate the vascular anatomy.  Contrast: OMNIPAQUE IOHEXOL 350 MG/ML SOLN  Comparison: No priors.  Findings:  Mediastinum: Study is slightly limited by patient respiratory motion.  However, there is no evidence to suggest clinically relevant central, lobar or segmental sized pulmonary embolism.  In portions of the lungs, smaller subsegmental sized pulmonary embolism cannot be excluded secondary to motion. Heart size is mildly enlarged with left ventricular dilatation.  There is some rounding of the left ventricular apex. There is atherosclerosis of the thoracic aorta, the great vessels of the mediastinum and the coronary arteries, including calcified atherosclerotic plaque in the left main, left anterior descending, left circumflex and right coronary arteries. Calcifications of the mitral annulus.  Numerous borderline enlarged mediastinal and bilateral hilar lymph nodes are noted.  The patient is intubated with tip of endotracheal tube at the level of the inferior aspect of the aortic arch.  Nasogastric tube is seen extending into the stomach (tip extends below the lower margin of the image).  Lungs/Pleura: There are bilateral pleural effusions, moderate on the right and small on the left, which layer dependently.  There are areas of airspace consolidation and volume loss scattered throughout the lungs bilaterally, predominately dependently located, with relative sparing of the right middle lobe.  There are a few scattered pulmonary nodules, the largest of which is in the inferior segment of  the lingula (image 63 of series 5) measuring 8 mm. No pneumothorax.  Upper Abdomen: Unremarkable.  Musculoskeletal: Multiple nondisplaced and minimally displaced bilateral  anterolateral rib fractures are noted, likely related to recent resuscitation attempts.  Specifically, the right second, third, fourth, fifth and sixth ribs, as well as the left second (mildly displaced), third and fourth ribs.  There also appears to be an oblique nondisplaced sternal fracture best demonstrated on sagittal image 79 of series 9.  IMPRESSION:  1.  Despite the mild limitations of this examination there is no evidence to suggest clinically relevant central, lobar or segmental sized pulmonary embolism. 2.  There is however widespread air space disease and volume loss throughout the lungs bilaterally, as above, predominately dependently located, suspicious for a large volume aspiration with developing pneumonia. 3.  The moderate right and small left-sided pleural effusions layering dependently. 4.  Numerous bilateral anterolateral rib fractures, the majority of which are nondisplaced, as above. 5. Atherosclerosis, including left main and three-vessel coronary artery disease.  In addition, there is mild cardiomegaly with dilatation of the left ventricle and rounding of the left ventricular apex. 6.  Support apparatus, as above.   Original Report Authenticated By: Florencia Reasons, M.D.    Dg Chest Port 1 View  09/30/2011  *RADIOLOGY REPORT*  Clinical Data: Edema.  Respiratory difficulty.  PORTABLE CHEST - 1 VIEW  Comparison: Yesterday  Findings: Endotracheal tube, NG tube, left internal jugular vein center venous catheter are stable.  Diffuse consolidation throughout the left lung worse.  Focal consolidation in the right upper lobe and central right lower lung zone are worse.  No pneumothorax.  IMPRESSION: Worsening bilateral airspace disease.   Original Report Authenticated By: Donavan Burnet, M.D.      ASSESSMENT AND PLAN  PULMONARY  Lab 09/28/11 0352 09/27/11 2013 09/27/11 1353 09/27/11 1217 09/27/11 1046  PHART 7.479* 7.341* 7.310* 7.251* 7.163*  PCO2ART 34.5* 44.9 37.2 39.1 44.4    PO2ART 69.0* 113.0* 72.0* 48.0* 53.0*  HCO3 25.5* 24.3* 19.3* 17.9* 16.8*  O2SAT 94.0 98.0 95.0 84.0 86.0   Ventilator Settings: Vent Mode:  [-] PRVC FiO2 (%):  [40 %-50 %] 50 % Set Rate:  [24 bmp] 24 bmp Vt Set:  [500 mL] 500 mL PEEP:  [5 cmH20] 5 cmH20 Plateau Pressure:  [23 cmH20-27 cmH20] 24 cmH20  CXR:  9/11 >>> Hardware in good position, improved aeration, small left effusion ETT:  9/10>>>  A:  Acute respiratory failure post cardiac arrest. CT 9/13 - neg for PE  - on 10/01/2011: worsening fio2 and cxr. Agitated. So, does not meet sbt criteria  P:   Full vent support SBT daily asessment   CARDIOVASCULAR  Lab 10/01/11 0500 10/01/11 0423 09/28/11 0436 09/27/11 1059 09/27/11 0556 09/27/11 0349 09/27/11 0348 09/27/11 0002 09/26/11 1935 09/26/11 1808 09/26/11 1524  TROPONINI -- -- -- -- 5.77* -- -- 6.94* -- 2.19* <0.30  LATICACIDVEN 1.6 -- 3.7* 6.5* -- -- 5.5* -- 5.1* -- --  PROBNP -- 1056.0* -- -- -- 544.8* -- -- -- 116.0 --   TTE:  9/10 >>> EF 45%, severe hypokinesis of inferior myocardium consistent with MI. ECG:  9/11 >>> LBBB Lines: L IJ TLC 9/10 >>  A: VT/VF arrest. NSTEMI.  Arrhythmias.  Shock, likely cardiogenic, resolved, off pressors.  -on 10/01/11: Not on pressors P:  Heparin gtt per Cardiology recommendations after checking INR  low dose beta blocker - Metoprolol 2.5 IV q6h Hold ACE for now ASA Statin contraindicated (elevated liver enzymes)  Out of window for emergent cardiac cath, but possibly  elective procedure before discharge Goal MAP 60-65  RENAL  Lab 10/01/11 0423 09/30/11 0500 09/29/11 0500 09/28/11 1500 09/28/11 0355 09/27/11 1425 09/27/11 0600 09/26/11 1802 09/26/11 1524  NA 149* 143 145 143 143 -- -- -- --  K 3.4* 3.4* -- -- -- -- -- -- --  CL 117* 111 110 105 107 -- -- -- --  CO2 24 24 24 27 26  -- -- -- --  BUN 22 20 22 13 12  -- -- -- --  CREATININE 0.56 0.65 0.86 0.78 0.64 -- -- -- --  CALCIUM 8.2* 7.6* 7.4* 7.3* 6.9* -- -- -- --  MG 2.5  -- -- -- 1.7 -- 1.9 1.9 2.6*  PHOS 2.0* -- -- -- 2.4 2.2* 0.6* 3.8 --   Intake/Output      09/14 0701 - 09/15 0700 09/15 0701 - 09/16 0700   I.V. (mL/kg) 1002.1 (11.4)    NG/GT 940    IV Piggyback 450    Total Intake(mL/kg) 2392.1 (27.2)    Urine (mL/kg/hr) 1195 (0.6)    Stool 0    Total Output 1195    Net +1197.1         Stool Occurrence 3 x     Foley:  9/10 >>>  A: Normal renal function.  Hypokalemia and hypophos P:   Replete K and phos Trend BMP and  mag and phos  GASTROINTESTINAL  Lab 09/28/11 0355 09/27/11 0349 09/26/11 1802  AST 93* 253* 293*  ALT 120* 214* 281*  ALKPHOS 73 80 111  BILITOT 0.7 0.8 0.9  PROT 5.3* 6.0 6.9  ALBUMIN 2.3* 2.9* 3.6   A:  Shocked liver, improving transaminases. P:   NPO as intubated TF  HEMATOLOGIC  Lab 10/01/11 0423 09/30/11 0935 09/30/11 0500 09/29/11 0925 09/29/11 0500 09/28/11 1049 09/28/11 0355 09/27/11 0600 09/27/11 0205 09/26/11 1802  HGB 10.5* -- 9.2* -- 9.8* -- 12.2 14.4 -- --  HCT 32.5* -- 28.2* -- 29.7* -- 35.9* 43.6 -- --  PLT 172 -- 174 -- 185 -- 243 318 -- --  INR 1.77* 2.42* -- 3.51* -- 1.26 -- -- 3.53* --  APTT -- -- -- -- -- -- -- -- 59* 39*   A:  Coagulopathy (Coumadin induced, exacerbated by hypothermia) - resolved. Anemia, no overt hemorrhage. P: Trend CBC PRBC for hgg < 8gm% due to MI Hold Coumadin  INFECTIOUS  Lab 10/01/11 0423 09/30/11 0500 09/29/11 0500 09/28/11 0355 09/27/11 0600 09/27/11 0349  WBC 10.0 14.9* 10.7* 12.5* 18.7* --  PROCALCITON 1.66 -- -- 6.68 -- 6.28   Cultures: 9/12  Blood >>> ntd 9/12  Urine >>> E.Coli 9/12  Respiratory >>> G-R, G+C 9/12  C.diff PCR >>> neg  Antibiotics: 9/12  Levaquin (empirical, G-) >>> 9/12  Flagyl (empirical, anaerobes, C.diff) >>>  A:  UTI.  ? Legionella pneumonia (pulmonary infiltrates / diarrhea).  - n 09/30/11 and 10/01/11: no fever. PCT 1/66 on 9/16 P: ABx / Cx as above; on 9/16/`13 evaluate for abx stopping   ENDOCRINE  Lab 10/01/11 0401  09/30/11 2310 09/30/11 2027 09/30/11 1532 09/30/11 1155  GLUCAP 110* 144* 116* 136* 120*   A:  Hyperglycemia.  Presumed relative adrenal insufficiency. Hydrocort stopped 09/30/11 P:    SSI Lantus  NEUROLOGIC  A:  Acute encephalopathy, improving.  Significant anxiety component.  - on 9'14/13: Follows some commands. Did not need precedex  - on 10/01/11: RASS +3 P:   STart precedex 10/01/11  Fent gtt PRN versed  BEST PRACTICES / DISPO ICU status Full code TF Protonix SCDs Skin intact Family not at bedsiide 10/01/2011   The patient is critically ill with multiple organ systems failure and requires high complexity decision making for assessment and support, frequent evaluation and titration of therapies, application of advanced monitoring technologies and extensive interpretation of multiple databases. Critical Care Time devoted to patient care services described in this note is 45 minutes.   Dr. Kalman Shan, M.D., [facility].C.P Pulmonary and Critical Care Medicine Staff Physician [facility] Pager: [phone], If no answer or between  15:00h - 7:00h: call [phone]  10/01/2011 7:27 AM

## 2011-10-01 NOTE — Progress Notes (Signed)
Dr. Marchelle Gearing notified of patients bradacardia, rate in the 40s, now that precidex gtt initiated. Patients BP stable. Map > 60. Rass -1. No new orders received. Will continue to monitor patient's HR/ BP closely and notify MD as needed.

## 2011-10-01 NOTE — Progress Notes (Signed)
ANTICOAGULATION CONSULT NOTE - Initial Consult  Pharmacy Consult for Heparin Indication: chest pain/ACS/Afib  Assessment: Alicia Guerrero known to pharmacy for anticoagulation dosing and monitoring, pt. is s/p VT arrest on admission, hypothermia (now D/C'd), and NSTEMI. She is on chronic coumadin for Afib, INR has been elevated since admission now trending down to 1.77 this AM. Will start heparin infusion and Anticipate cath as hemodyanmic stabilize. Hgb 10.5, Plt 172, previously low-end therapeutic on heparin gtt 1150 units/hr.   Goal of Therapy:  Heparin level 0.3-0.7 units/ml Monitor platelets by anticoagulation protocol: Yes   Plan:  - Start heparin infusion 1200 units/hr, no bolus due to elevated INR - F/u Heparin level 8 hrs after infusion start - Daily heparin level and CBC  Bayard Hugger, PharmD, BCPS  Clinical Pharmacist  Pager: [phone]    Allergies  Allergen Reactions  . Meperidine Hcl Other (See Comments)    Mixed with phenergan swelling of the mouth   . Penicillins Other (See Comments)    Unknown reaction  . Molds & Smuts Other (See Comments)    Runny nose  . Tape Other (See Comments)    Redness, pulls skin off    Patient Measurements: Height: 5' 3.5" (161.3 cm) Weight: 194 lb 3.6 oz (88.1 kg) IBW/kg (Calculated) : 53.55  Heparin Dosing Weight: 73 kg  Vital Signs: Temp: 98.5 F (36.9 C) (09/15 0400) Temp src: Oral (09/15 0400) BP: 126/54 mmHg (09/15 0600) Pulse Rate: 56  (09/15 0600)  Labs:  Basename 10/01/11 0423 09/30/11 0935 09/30/11 0500 09/29/11 0925 09/29/11 0500 09/28/11 2215  HGB 10.5* -- 9.2* -- -- --  HCT 32.5* -- 28.2* -- 29.7* --  PLT 172 -- 174 -- 185 --  APTT -- -- -- -- -- --  LABPROT 20.9* 26.7* -- 35.7* -- --  INR 1.77* 2.42* -- 3.51* -- --  HEPARINUNFRC -- -- -- 0.31 -- 0.11*  CREATININE 0.56 -- 0.65 -- 0.86 --  CKTOTAL -- -- -- -- -- --  CKMB -- -- -- -- -- --  TROPONINI -- -- -- -- -- --    Estimated Creatinine Clearance: 65.6  ml/min (by C-G formula based on Cr of 0.56).   Medical History: Past Medical History  Diagnosis Date  . Left bundle branch block   . Atrial fibrillation   . Acute myocardial infarction, unspecified site, episode of care unspecified   . Coronary atherosclerosis   . Pure hypercholesterolemia   . Unspecified essential hypertension   . Cough   . Other atopic dermatitis and related conditions   . Acute upper respiratory infections of unspecified site   . Allergic rhinitis due to pollen   . Unspecified asthma   . Other and unspecified ovarian cyst   . Personal history of malignant neoplasm of breast   . Personal history of hyperthyroidism   . Chronic eczema     hands  . Hx of coronary angioplasty     percutaneous transluminal  . Hemorrhoids   . Cancer      10/01/2011,8:35 AM

## 2011-10-02 ENCOUNTER — Inpatient Hospital Stay (HOSPITAL_COMMUNITY): Payer: Medicare Other

## 2011-10-02 DIAGNOSIS — E870 Hyperosmolality and hypernatremia: Secondary | ICD-10-CM | POA: Diagnosis not present

## 2011-10-02 LAB — PRO B NATRIURETIC PEPTIDE: Pro B Natriuretic peptide (BNP): 1114 pg/mL — ABNORMAL HIGH (ref 0–125)

## 2011-10-02 LAB — PHOSPHORUS: Phosphorus: 2.8 mg/dL (ref 2.3–4.6)

## 2011-10-02 LAB — BASIC METABOLIC PANEL
Chloride: 120 mEq/L — ABNORMAL HIGH (ref 96–112)
Creatinine, Ser: 0.62 mg/dL (ref 0.50–1.10)
GFR calc Af Amer: >90 mL/min (ref >90)
Potassium: 4.7 mEq/L (ref 3.5–5.1)

## 2011-10-02 LAB — CBC
Platelets: 208 10*3/uL (ref 150–400)
RDW: 15.4 % (ref 11.5–15.5)
WBC: 10.8 10*3/uL — ABNORMAL HIGH (ref 4.0–10.5)

## 2011-10-02 LAB — GLUCOSE, CAPILLARY: Glucose-Capillary: 128 mg/dL — ABNORMAL HIGH (ref 70–99)

## 2011-10-02 LAB — LEGIONELLA ANTIGEN, URINE

## 2011-10-02 LAB — HEPARIN LEVEL (UNFRACTIONATED)
Heparin Unfractionated: 0.27 IU/mL — ABNORMAL LOW (ref 0.30–0.70)
Heparin Unfractionated: 0.34 IU/mL (ref 0.30–0.70)

## 2011-10-02 MED ORDER — MIDAZOLAM HCL 2 MG/2ML IJ SOLN
1.0000 mg | INTRAMUSCULAR | Status: DC | PRN
Start: 2011-10-02 — End: 2011-10-03
  Administered 2011-10-02 – 2011-10-03 (×2): 2 mg via INTRAVENOUS
  Filled 2011-10-02 (×2): qty 2

## 2011-10-02 MED ORDER — ETOMIDATE 2 MG/ML IV SOLN
INTRAVENOUS | Status: AC
  Administered 2011-10-02: 12:00:00
  Filled 2011-10-02: qty 20

## 2011-10-02 MED ORDER — LIDOCAINE HCL (CARDIAC) 20 MG/ML IV SOLN
INTRAVENOUS | Status: AC
  Filled 2011-10-02: qty 5

## 2011-10-02 MED ORDER — HEPARIN BOLUS VIA INFUSION
1000.0000 [IU] | Freq: Once | INTRAVENOUS | Status: AC
  Administered 2011-10-02: 1000 [IU] via INTRAVENOUS
  Filled 2011-10-02: qty 1000

## 2011-10-02 MED ORDER — METHYLPREDNISOLONE SODIUM SUCC 125 MG IJ SOLR
80.0000 mg | Freq: Once | INTRAMUSCULAR | Status: AC
  Administered 2011-10-02: 80 mg via INTRAVENOUS
  Filled 2011-10-02: qty 2

## 2011-10-02 MED ORDER — ROCURONIUM BROMIDE 50 MG/5ML IV SOLN
INTRAVENOUS | Status: AC
  Filled 2011-10-02: qty 2

## 2011-10-02 MED ORDER — RACEPINEPHRINE HCL 2.25 % IN NEBU: 0.5000 mL | INHALATION_SOLUTION | Freq: Once | RESPIRATORY_TRACT | Status: DC

## 2011-10-02 MED ORDER — MIDAZOLAM HCL 2 MG/2ML IJ SOLN
INTRAMUSCULAR | Status: AC
  Administered 2011-10-02: 2 mg via INTRAVENOUS
  Filled 2011-10-02: qty 2

## 2011-10-02 MED ORDER — ALBUTEROL SULFATE (5 MG/ML) 0.5% IN NEBU
INHALATION_SOLUTION | RESPIRATORY_TRACT | Status: AC
  Administered 2011-10-02: 2.5 mg
  Filled 2011-10-02: qty 0.5

## 2011-10-02 MED ORDER — SODIUM CHLORIDE 0.9 % IV SOLN
25.0000 ug/h | INTRAVENOUS | Status: DC
  Administered 2011-10-02 – 2011-10-04 (×3): 200 ug/h via INTRAVENOUS
  Filled 2011-10-02 (×6): qty 50

## 2011-10-02 MED ORDER — ALBUTEROL SULFATE (5 MG/ML) 0.5% IN NEBU: 2.5000 mg | INHALATION_SOLUTION | Freq: Once | RESPIRATORY_TRACT | Status: DC

## 2011-10-02 MED ORDER — SUCCINYLCHOLINE CHLORIDE 20 MG/ML IJ SOLN
INTRAMUSCULAR | Status: AC
  Filled 2011-10-02: qty 10

## 2011-10-02 NOTE — Progress Notes (Signed)
ANTICOAGULATION CONSULT NOTE - Follow Up Consult  Pharmacy Consult for heparin Indication: chest pain/ACS and atrial fibrillation  Labs:  Basename 10/02/11 2125 10/02/11 1057 10/02/11 0300 10/01/11 0423 09/30/11 0935 09/30/11 0500  HGB -- -- 9.4* 10.5* -- --  HCT -- -- 29.3* 32.5* -- 28.2*  PLT -- -- 208 172 -- 174  APTT -- -- -- -- -- --  LABPROT -- -- -- 20.9* 26.7* --  INR -- -- -- 1.77* 2.42* --  HEPARINUNFRC 0.34 0.25* 0.27* -- -- --  CREATININE -- -- 0.62 0.56 -- 0.65  CKTOTAL -- -- -- -- -- --  CKMB -- -- -- -- -- --  TROPONINI -- -- -- -- -- --      . sodium chloride 10 mL/hr at 10/02/11 0354  . sodium chloride 20 mL/hr at 10/02/11 1900  . dexmedetomidine 1.2 mcg/kg/hr (10/02/11 2200)  . fentaNYL infusion INTRAVENOUS 200 mcg/hr (10/02/11 2000)  . heparin 1,650 Units/hr (10/02/11 2030)  . DISCONTD: fentaNYL infusion INTRAVENOUS 75 mcg/hr (10/02/11 6962)     Assessment: 76yo female is now therapeutic on heparin after a rate adjustment.  No note of bleeding. Heparin dosing weight 73kg.   Goal of Therapy:  Heparin level 0.3-0.7 units/ml   Plan:  Continue Heparin at 1650 units/hr Recheck Heparin level and CBC with AM Labs.  Estella Husk, Pharm.D., BCPS Clinical Pharmacist  Phone [phone] Pager [phone] 10/02/2011, 10:44 PM

## 2011-10-02 NOTE — Procedures (Signed)
Intubation Procedure Note AUBRIANAH WADHWANI [id number] 11/05/1935  Procedure: Intubation Indications: Respiratory insufficiency  Procedure Details Consent: Risks of procedure as well as the alternatives and risks of each were explained to the (patient/caregiver).  Consent for procedure obtained. Time Out: Verified patient identification, verified procedure, site/side was marked, verified correct patient position, special equipment/implants available, medications/allergies/relevent history reviewed, required imaging and test results available.  Performed  Maximum sterile technique was used including gloves, gown and mask.  MAC and 4    Evaluation Hemodynamic Status: BP stable throughout; O2 sats: stable throughout Patient's Current Condition: stable Complications: No apparent complications Patient did tolerate procedure well. Chest X-ray ordered to verify placement.  CXR: pending.   Sherlyne Crownover S. 10/02/2011

## 2011-10-02 NOTE — Progress Notes (Signed)
PT Cancellation/Discharge Note  Treatment cancelled today due to medical issues with patient which prohibited therapy. Pt reintubated.  Will sign-off. Please re-order when appropriate.  Lennox Leikam 10/02/2011, 3:06 PM

## 2011-10-02 NOTE — Progress Notes (Signed)
SLP Cancellation Note  Swallow eval cancelled - pt did not tolerate extubation; now reintubated per RN.  SLP to sign-off.  Everest Hacking L. Samson Frederic, [place] CCC/SLP Pager [phone]   Blenda Mounts Laurice 10/02/2011, 12:16 PM

## 2011-10-02 NOTE — Progress Notes (Signed)
New orders for a rate of 14

## 2011-10-02 NOTE — Procedures (Signed)
Extubation Procedure Note  Patient Details:   Name: Alicia Guerrero DOB: 07-Apr-1935 MRN: [id number]   Airway Documentation:  AIRWAYS 8 mm (Active)  Secured at (cm) 24 cm 09/26/2011 12:00 AM    Evaluation  O2 sats: stable throughout and currently acceptable Complications: No apparent complications Patient did tolerate procedure well. Bilateral Breath Sounds: Rhonchi Suctioning: Airway Yes  Pt extubated per MD order.Marland Kitchen Pt placed on 5L [place], sat 91%. BBS Rh.  Positive cuff leak. Questionable stridor. MD aware. Pt with weak cough also. Arloa Koh 10/02/2011, 11:19 AM

## 2011-10-02 NOTE — Progress Notes (Addendum)
Patient Name: Alicia Guerrero Date of Encounter: 10/02/2011  Principal Problem:  *Cardiac arrest - ventricular fibrillation Active Problems:  HYPERTENSION  CORONARY ARTERY DISEASE  Acute respiratory failure  Altered mental status  Acute non-ST segment elevation myocardial infarction  Hypokalemia  Sinus bradycardia    SUBJECTIVE: Awake on vent, moves head and arms, looks around but no indication of comprehension of questions, does not appear to recognize husband.  OBJECTIVE Filed Vitals:   10/02/11 0400 10/02/11 0500 10/02/11 0530 10/02/11 0600  BP: 137/65  136/64 129/65  Pulse: 41  41 42  Temp:      TempSrc:      Resp: 0  24 24  Height:      Weight:  199 lb 11.8 oz (90.6 kg)    SpO2: 95%  93% 93%    Intake/Output Summary (Last 24 hours) at 10/02/11 0708 Last data filed at 10/02/11 0600  Gross per 24 hour  Intake 3036.66 ml  Output   1130 ml  Net 1906.66 ml   Filed Weights   09/30/11 0443 10/01/11 0449 10/02/11 0500  Weight: 191 lb 12.8 oz (87 kg) 194 lb 3.6 oz (88.1 kg) 199 lb 11.8 oz (90.6 kg)     PHYSICAL EXAM General: Well developed, well nourished, female, awake on vent Head: Normocephalic, atraumatic.  Neck: Supple without bruits, JVD slightly elevated, difficult to assess secondary to equipment. Lungs:  Resp regular and unlabored, clear anteriorly. Heart: RRR, S1, S2, no S3, S4, or murmur. Abdomen: Soft, non-tender, non-distended, BS + x 4.  Extremities: No clubbing, cyanosis, no edema.  Neuro: Alert. Moves upper extremities spontaneously.  LABS: CBC: Basename 10/02/11 0300 10/01/11 0423  WBC 10.8* 10.0  NEUTROABS -- --  HGB 9.4* 10.5*  HCT 29.3* 32.5*  MCV 82.1 81.3  PLT 208 172   INR: Basename 10/01/11 0423  INR 1.77*   Basic Metabolic Panel: Basename 10/02/11 0300 10/01/11 0423  NA 149* 149*  K 4.7 3.4*  CL 120* 117*  CO2 22 24  GLUCOSE 164* 126*  BUN 25* 22  CREATININE 0.62 0.56  CALCIUM 8.5 8.2*  MG 2.4 2.5  PHOS 2.8 2.0*    BNP: Pro B Natriuretic peptide (BNP)  Date/Time Value Range Status  10/02/2011  3:00 AM 1114.0* 0 - 125 pg/mL Final  10/01/2011  4:23 AM 1056.0* 0 - 125 pg/mL Final   Lab Results  Component Value Date   CKTOTAL 1385* 09/27/2011   CKMB 60.6* 09/27/2011   TROPONINI 5.77* 09/27/2011     TELE: S brady to the 40s, SR, occ PVCs    Radiology/Studies: Dg Chest Port 1 View 10/02/2011  *RADIOLOGY REPORT*  Clinical Data: Intubated patient.  PORTABLE CHEST - 1 VIEW  Comparison: Chest 10/01/2011 and 09/30/2011.  CT chest 09/29/2011.  Findings: Support tubes and lines are unchanged.  Tip of the patient's endotracheal tube remains approximately 1.4 cm above the carina.  Right pleural effusion and airspace disease have worsened with no notable change on the left.  Cardiomegaly noted.  No pneumothorax.  IMPRESSION:  1.  Increased right pleural effusion and airspace disease.  Smaller left effusion and airspace disease in the left chest appear unchanged. 2.  Endotracheal tube tip is 1.4 cm above the carina.   Original Report Authenticated By: Bernadene Bell. Maricela Curet, M.D.    Current Medications:     . antiseptic oral rinse  15 mL Mouth Rinse Q4H  . aspirin  325 mg Per Tube Daily  . chlorhexidine  15 mL  Mouth/Throat BID  . feeding supplement  30 mL Oral QID  . feeding supplement (PROMOTE)  1,000 mL Per Tube Q24H  . insulin aspart  0-15 Units Subcutaneous Q4H  . insulin glargine  10 Units Subcutaneous QHS  . levofloxacin (LEVAQUIN) IV  750 mg Intravenous Q24H  . metoprolol  2.5 mg Intravenous Q6H  . metronidazole  500 mg Intravenous Q8H  . multivitamin  5 mL Oral Daily  . pantoprazole (PROTONIX) IV  40 mg Intravenous Q24H  . potassium chloride  40 mEq Per Tube BID  . potassium chloride  40 mEq Per Tube Once  . potassium chloride      . potassium phosphate IVPB (mmol)  15 mmol Intravenous Once   . sodium chloride 10 mL/hr at 10/02/11 0354  . sodium chloride 20 mL/hr at 10/01/11 0452  . dexmedetomidine  0.2 mcg/kg/hr (10/02/11 0600)  . fentaNYL infusion INTRAVENOUS 150 mcg/hr (10/01/11 1247)  . heparin 1,500 Units/hr (10/02/11 0410)    ASSESSMENT AND PLAN: Principal Problem:  *Cardiac arrest - ventricular fibrillation - EF 45% by echo, NSTEMI by ez, +WMA on echo, cath planned when medically appropriate. MD advise. She is on ASA, no statin secondary to abnl LFTs, BB held secondary to low HR since yesterday AM.  Otherwise, per CCM Active Problems:  HYPERTENSION  CORONARY ARTERY DISEASE  Acute respiratory failure - CXR with increased AS dz but BNP is about the same, diuretics per MD; possible extubation today, per CCM  Altered mental status  Acute non-ST segment elevation myocardial infarction  Hypokalemia  Sinus bradycardia Hypernatremia  Signed, Theodore Demark , PA-C 7:08 AM 10/02/2011 Patient examined and agree. Will follow.  Valera Castle, MD 10/02/2011 7:42 AM

## 2011-10-02 NOTE — Progress Notes (Signed)
eLink Physician-Brief Progress Note Patient Name: Alicia Guerrero DOB: Mar 12, 1935 MRN: [id number]  Date of Service  10/02/2011   HPI/Events of Note  Bradyarrhythmia with wide complex and multifocal PVCs, occasional couplets  eICU Interventions  EKG now. D/C precedex. Hold metoprolol for HR < 60/min      Billy Fischer 10/02/2011, 11:01 PM

## 2011-10-02 NOTE — Progress Notes (Signed)
Per MD order pull ETT back 1 cm

## 2011-10-02 NOTE — Progress Notes (Signed)
ANTICOAGULATION CONSULT NOTE - Follow Up Consult  Pharmacy Consult for heparin Indication: chest pain/ACS and atrial fibrillation  Labs:  Basename 10/02/11 0300 10/01/11 1700 10/01/11 0423 09/30/11 0935 09/30/11 0500 09/29/11 0925 09/29/11 0500  HGB 9.4* -- 10.5* -- -- -- --  HCT 29.3* -- 32.5* -- 28.2* -- --  PLT 208 -- 172 -- 174 -- --  APTT -- -- -- -- -- -- --  LABPROT -- -- 20.9* 26.7* -- 35.7* --  INR -- -- 1.77* 2.42* -- 3.51* --  HEPARINUNFRC 0.27* <0.10* -- -- -- 0.31 --  CREATININE -- -- 0.56 -- 0.65 -- 0.86  CKTOTAL -- -- -- -- -- -- --  CKMB -- -- -- -- -- -- --  TROPONINI -- -- -- -- -- -- --    Assessment: 76yo female remains subtherapeutic on heparin after rate increase though now close to goal.  Goal of Therapy:  Heparin level 0.3-0.7 units/ml   Plan:  Will increase heparin gtt by ~1 unit/kg/hr to 1500 units/hr and check level in 6-8hr.  Colleen Can PharmD BCPS 10/02/2011,4:11 AM

## 2011-10-02 NOTE — Progress Notes (Signed)
Tube documents as being removed on 9/10. No other tube to document against

## 2011-10-02 NOTE — Progress Notes (Signed)
Name: Alicia Guerrero MRN: [id number] DOB: [birth year]    LOS: 6 Date of admit : 09/26/2011 PCP is Illene Regulus, MD   Referring Provider:  [facility] Reason for Referral:  Cardiac arrest  PULMONARY / CRITICAL CARE MEDICINE  Patient summary:  76 yo with extensive cardiac history brought to [facility] ED after VT/VF arrest.  Transferred to [facility] for hypothermia protocol.  Events Since Admission: 9/10  VT/VF arrest, intubated, hypothermia initiated 9/11  Hypothermia stopped as multiple arrhythmias and coagulopathy 9/13:   Off pressors.  RN reports significant anxiety / agitation off sedation. 9/14:  - Follows commands periodically per RN. Off pressors. Met SBT criteria but failed it instantaneously; became tachypneic  - Never needed precedex    SUBJECTIVE/OVERNIGHT/INTERVAL HX Improved agitation and MS am 9/16, precedex at 0.2 Tolerating PSV, following commands   Vital Signs: Temp:  [97.6 F (36.4 C)-99.1 F (37.3 C)] 98.6 F (37 C) (09/16 0747) Pulse Rate:  [41-64] 64  (09/16 1000) Resp:  [0-28] 28  (09/16 1000) BP: (88-142)/(50-90) 142/90 mmHg (09/16 0957) SpO2:  [91 %-98 %] 91 % (09/16 1000) FiO2 (%):  [40 %-50 %] 40 % (09/16 1000) Weight:  [90.6 kg (199 lb 11.8 oz)] 90.6 kg (199 lb 11.8 oz) (09/16 0500)  Intake/Output Summary (Last 24 hours) at 10/02/11 1042 Last data filed at 10/02/11 1000  Gross per 24 hour  Intake 3028.2 ml  Output   1055 ml  Net 1973.2 ml    Physical Examination: General:  Intubated, mechanically ventilated Neuro:  Follows commands RASS 0. Awake and tracks,  HEENT: PERRL Neck:  No JVD Cardiovascular:  RRR, no murmurs Lungs:  Bilateral air entry, few rales Abdomen:  Soft, bowel sounds present Musculoskeletal:  No edema Skin:  Intact.  Principal Problem:  *Cardiac arrest - ventricular fibrillation Active Problems:  HYPERTENSION  CORONARY ARTERY DISEASE  Acute respiratory failure  Altered mental status  Acute non-ST segment elevation myocardial  infarction  Hypokalemia  Sinus bradycardia  Hyperosmolality and/or hypernatremia  Dg Chest Port 1 View  10/02/2011  *RADIOLOGY REPORT*  Clinical Data: Intubated patient.  PORTABLE CHEST - 1 VIEW  Comparison: Chest 10/01/2011 and 09/30/2011.  CT chest 09/29/2011.  Findings: Support tubes and lines are unchanged.  Tip of the patient's endotracheal tube remains approximately 1.4 cm above the carina.  Right pleural effusion and airspace disease have worsened with no notable change on the left.  Cardiomegaly noted.  No pneumothorax.  IMPRESSION:  1.  Increased right pleural effusion and airspace disease.  Smaller left effusion and airspace disease in the left chest appear unchanged. 2.  Endotracheal tube tip is 1.4 cm above the carina.   Original Report Authenticated By: Bernadene Bell. Maricela Curet, M.D.    Dg Chest Port 1 View  10/01/2011  *RADIOLOGY REPORT*  Clinical Data: Endotracheal tube check  PORTABLE CHEST - 1 VIEW  Comparison: Yesterday  Findings: Endotracheal tube is stable with it is tip 1.2 cm from the carina.  Diffuse bilateral airspace disease has improved.  No pneumothorax.  Left internal jugular vein central venous catheter, NG tube are stable.  No pneumothorax.  Pleural effusions may be present and small.  IMPRESSION: Improved airspace disease.  Stable endotracheal tube.   Original Report Authenticated By: Donavan Burnet, M.D.      ASSESSMENT AND PLAN  PULMONARY  Lab 09/28/11 0352 09/27/11 2013 09/27/11 1353 09/27/11 1217 09/27/11 1046  PHART 7.479* 7.341* 7.310* 7.251* 7.163*  PCO2ART 34.5* 44.9 37.2 39.1 44.4  PO2ART 69.0*  113.0* 72.0* 48.0* 53.0*  HCO3 25.5* 24.3* 19.3* 17.9* 16.8*  O2SAT 94.0 98.0 95.0 84.0 86.0   Ventilator Settings: Vent Mode:  [-] CPAP FiO2 (%):  [40 %-50 %] 40 % Set Rate:  [24 bmp] 24 bmp Vt Set:  [500 mL] 500 mL PEEP:  [5 cmH20] 5 cmH20 Pressure Support:  [5 cmH20] 5 cmH20 Plateau Pressure:  [22 cmH20-27 cmH20] 26 cmH20  CXR:  9/16 >> B scattered ASD,  probable B effusions.  ETT:  9/10>>>  A:  Acute respiratory failure post cardiac arrest. CT 9/13 - neg for PE. Progression to extubation limited by MS and agitation P:   Goal extubate today 9/16 Push pulm hygiene once extubated  CARDIOVASCULAR  Lab 10/02/11 0300 10/01/11 0500 10/01/11 0423 09/28/11 0436 09/27/11 1059 09/27/11 0556 09/27/11 0349 09/27/11 0348 09/27/11 0002 09/26/11 1935 09/26/11 1808 09/26/11 1524  TROPONINI -- -- -- -- -- 5.77* -- -- 6.94* -- 2.19* <0.30  LATICACIDVEN -- 1.6 -- 3.7* 6.5* -- -- 5.5* -- 5.1* -- --  PROBNP 1114.0* -- 1056.0* -- -- -- 544.8* -- -- -- 116.0 --   TTE:  9/10 >>> EF 45%, severe hypokinesis of inferior myocardium consistent with MI. ECG:  9/11 >>> LBBB Lines: L IJ TLC 9/10 >>  A: VT/VF arrest. NSTEMI.  Arrhythmias.  Shock, likely cardiogenic, resolved, off pressors. P:  Heparin gtt per Cardiology low dose beta blocker >> held temporarily for brady while on precedex Hold ACE for now ASA Statin contraindicated (elevated liver enzymes) possibly elective cath before discharge Goal MAP 60-65  RENAL  Lab 10/02/11 0300 10/01/11 0423 09/30/11 0500 09/29/11 0500 09/28/11 1500 09/28/11 0355 09/27/11 1425 09/27/11 0600 09/26/11 1802  NA 149* 149* 143 145 143 -- -- -- --  K 4.7 3.4* -- -- -- -- -- -- --  CL 120* 117* 111 110 105 -- -- -- --  CO2 22 24 24 24 27  -- -- -- --  BUN 25* 22 20 22 13  -- -- -- --  CREATININE 0.62 0.56 0.65 0.86 0.78 -- -- -- --  CALCIUM 8.5 8.2* 7.6* 7.4* 7.3* -- -- -- --  MG 2.4 2.5 -- -- -- 1.7 -- 1.9 1.9  PHOS 2.8 2.0* -- -- -- 2.4 2.2* 0.6* --   Intake/Output      09/15 0701 - 09/16 0700 09/16 0701 - 09/17 0700   I.V. (mL/kg) 1428.7 (15.8) 145.4 (1.6)   NG/GT 980 190   IV Piggyback 708    Total Intake(mL/kg) 3116.7 (34.4) 335.4 (3.7)   Urine (mL/kg/hr) 1130 (0.5) 225   Total Output 1130 225   Net +1986.7 +110.4         Foley:  9/10 >>>  A: Normal renal function.  Hypokalemia and  hypophos Hypernatremia P:   Trend BMP and mag and phos Will need to add free water, will assess her for ability to take PO before ordering D5W or placing NGT for free water flushes  GASTROINTESTINAL  Lab 09/28/11 0355 09/27/11 0349 09/26/11 1802  AST 93* 253* 293*  ALT 120* 214* 281*  ALKPHOS 73 80 111  BILITOT 0.7 0.8 0.9  PROT 5.3* 6.0 6.9  ALBUMIN 2.3* 2.9* 3.6   A:  Shocked liver, improving transaminases. P:   TF, assess for PO once extubated  HEMATOLOGIC  Lab 10/02/11 0300 10/01/11 0423 09/30/11 0935 09/30/11 0500 09/29/11 0925 09/29/11 0500 09/28/11 1049 09/28/11 0355 09/27/11 0205 09/26/11 1802  HGB 9.4* 10.5* -- 9.2* -- 9.8* -- 12.2 -- --  HCT 29.3* 32.5* -- 28.2* -- 29.7* -- 35.9* -- --  PLT 208 172 -- 174 -- 185 -- 243 -- --  INR -- 1.77* 2.42* -- 3.51* -- 1.26 -- 3.53* --  APTT -- -- -- -- -- -- -- -- 59* 39*   A:  Coagulopathy (Coumadin induced, exacerbated by hypothermia) - resolved. Anemia, no overt hemorrhage. P: Trend CBC PRBC for hgg < 8gm% due to MI Hold Coumadin  INFECTIOUS  Lab 10/02/11 0300 10/01/11 0423 09/30/11 0500 09/29/11 0500 09/28/11 0355 09/27/11 0349  WBC 10.8* 10.0 14.9* 10.7* 12.5* --  PROCALCITON -- 1.66 -- -- 6.68 6.28   Cultures: 9/12  Blood >>> ntd >>  9/12  Urine >>> E.Coli 9/12  Respiratory >>> E coli 9/12  C.diff PCR >>> neg  Antibiotics: 9/12  Levaquin (empiric, E coli) >>> 9/12  Flagyl (empirical, anaerobes, C.diff) >>> 9/16  A:  UTI.  ? Legionella pneumonia (antigen negative) P: - continue levaquin - d/c flagyl 9/16  ENDOCRINE  Lab 10/02/11 0750 10/02/11 0323 10/01/11 2340 10/01/11 1931 10/01/11 1539  GLUCAP 128* 143* 131* 147* 160*   A:  Hyperglycemia.  Presumed relative adrenal insufficiency. Hydrocort stopped 09/30/11 P:   SSI Lantus  NEUROLOGIC A:  Acute encephalopathy, improving.  Significant anxiety component. P:   precedex Fent gtt PRN versed  BEST PRACTICES / DISPO ICU status Full  code TF Protonix SCDs Skin intact Family - discussed 9/16   The patient is critically ill with multiple organ systems failure and requires high complexity decision making for assessment and support, frequent evaluation and titration of therapies, application of advanced monitoring technologies and extensive interpretation of multiple databases. Critical Care Time devoted to patient care services described in this note is 45 minutes.   Levy Pupa, MD, PhD 10/02/2011, 10:59 AM [facility] [phone] or if no answer [phone]

## 2011-10-02 NOTE — Progress Notes (Addendum)
ANTICOAGULATION CONSULT NOTE - Follow Up Consult  Pharmacy Consult for heparin Indication: chest pain/ACS and atrial fibrillation  Labs:  Basename 10/02/11 1057 10/02/11 0300 10/01/11 1700 10/01/11 0423 09/30/11 0935 09/30/11 0500  HGB -- 9.4* -- 10.5* -- --  HCT -- 29.3* -- 32.5* -- 28.2*  PLT -- 208 -- 172 -- 174  APTT -- -- -- -- -- --  LABPROT -- -- -- 20.9* 26.7* --  INR -- -- -- 1.77* 2.42* --  HEPARINUNFRC 0.25* 0.27* <0.10* -- -- --  CREATININE -- 0.62 -- 0.56 -- 0.65  CKTOTAL -- -- -- -- -- --  CKMB -- -- -- -- -- --  TROPONINI -- -- -- -- -- --    Assessment: 76yo female remains subtherapeutic on heparin with a level of 0.25<0.27 after a rate increase. Hgb dropped to 9.4<10.5 today. No note of bleeding. Heparin dosing weight 73kg.   Goal of Therapy:  Heparin level 0.3-0.7 units/ml   Plan:  1) Heparin bolus 1000units x 1 2) Increase heparin rate to 1650 units/hr 3) Obtain 8 hour heparin level   [employer], Pharm.D. Clinical Pharmacist   Pager: [phone] 10/02/2011 12:32 PM

## 2011-10-02 NOTE — Progress Notes (Signed)
MD notified of pt's irritable rhythm and low heart rate;  precedex stopped per MD order; will continue to monitor closely and update as needed

## 2011-10-03 DIAGNOSIS — I498 Other specified cardiac arrhythmias: Secondary | ICD-10-CM

## 2011-10-03 DIAGNOSIS — I10 Essential (primary) hypertension: Secondary | ICD-10-CM

## 2011-10-03 LAB — BASIC METABOLIC PANEL
BUN: 22 mg/dL (ref 6–23)
Calcium: 8.8 mg/dL (ref 8.4–10.5)
Creatinine, Ser: 0.6 mg/dL (ref 0.50–1.10)
GFR calc Af Amer: >90 mL/min (ref >90)
GFR calc non Af Amer: 88 mL/min — ABNORMAL LOW (ref >90)

## 2011-10-03 LAB — CBC
MCH: 26 pg (ref 26.0–34.0)
MCV: 81.7 fL (ref 78.0–100.0)
Platelets: 274 10*3/uL (ref 150–400)
RDW: 15.4 % (ref 11.5–15.5)

## 2011-10-03 LAB — GLUCOSE, CAPILLARY
Glucose-Capillary: 113 mg/dL — ABNORMAL HIGH (ref 70–99)
Glucose-Capillary: 133 mg/dL — ABNORMAL HIGH (ref 70–99)
Glucose-Capillary: 167 mg/dL — ABNORMAL HIGH (ref 70–99)
Glucose-Capillary: 165 mg/dL — ABNORMAL HIGH (ref 70–99)
Glucose-Capillary: 192 mg/dL — ABNORMAL HIGH (ref 70–99)

## 2011-10-03 LAB — HEPARIN LEVEL (UNFRACTIONATED): Heparin Unfractionated: 0.48 IU/mL (ref 0.30–0.70)

## 2011-10-03 MED ORDER — METOPROLOL TARTRATE 12.5 MG HALF TABLET
12.5000 mg | ORAL_TABLET | Freq: Two times a day (BID) | ORAL | Status: DC
  Administered 2011-10-04 – 2011-10-12 (×16): 12.5 mg via ORAL
  Filled 2011-10-03 (×19): qty 1

## 2011-10-03 MED ORDER — LORAZEPAM 2 MG/ML IJ SOLN
2.0000 mg | INTRAMUSCULAR | Status: DC | PRN
  Administered 2011-10-03 – 2011-10-07 (×18): 2 mg via INTRAVENOUS
  Filled 2011-10-03 (×18): qty 1

## 2011-10-03 MED ORDER — METHYLPREDNISOLONE SODIUM SUCC 40 MG IJ SOLR
40.0000 mg | Freq: Two times a day (BID) | INTRAMUSCULAR | Status: DC
  Administered 2011-10-03 – 2011-10-05 (×6): 40 mg via INTRAVENOUS
  Filled 2011-10-03 (×8): qty 1

## 2011-10-03 MED ORDER — METOPROLOL TARTRATE 12.5 MG HALF TABLET
12.5000 mg | ORAL_TABLET | Freq: Two times a day (BID) | ORAL | Status: DC
  Administered 2011-10-03: 12.5 mg via ORAL
  Filled 2011-10-03 (×2): qty 1

## 2011-10-03 MED ORDER — FREE WATER
200.0000 mL | Freq: Four times a day (QID) | Status: DC
  Administered 2011-10-03 – 2011-10-09 (×22): 200 mL

## 2011-10-03 MED ORDER — LORAZEPAM 2 MG/ML IJ SOLN
2.0000 mg | INTRAMUSCULAR | Status: DC | PRN
  Administered 2011-10-03 (×2): 2 mg via INTRAVENOUS
  Filled 2011-10-03: qty 1

## 2011-10-03 MED ORDER — LORAZEPAM 2 MG/ML IJ SOLN
INTRAMUSCULAR | Status: AC
  Administered 2011-10-03: 2 mg via INTRAVENOUS
  Filled 2011-10-03: qty 1

## 2011-10-03 MED ORDER — MIDAZOLAM HCL 2 MG/2ML IJ SOLN
2.0000 mg | Freq: Once | INTRAMUSCULAR | Status: AC
  Administered 2011-10-03: 2 mg via INTRAVENOUS

## 2011-10-03 NOTE — Progress Notes (Signed)
Name: Alicia Guerrero MRN: [id number] DOB: [birth year]    LOS: 7 Date of admit : 09/26/2011 PCP is Illene Regulus, MD   Referring Provider:  [facility] Reason for Referral:  Cardiac arrest  PULMONARY / CRITICAL CARE MEDICINE  Patient summary:  76 yo with extensive cardiac history brought to [facility] ED after VT/VF arrest.  Transferred to [facility] for hypothermia protocol.  Events Since Admission: 9/10  VT/VF arrest, intubated, hypothermia initiated 9/11  Hypothermia stopped as multiple arrhythmias and coagulopathy 9/13:   Off pressors.  RN reports significant anxiety / agitation off sedation. 9/14:  - Follows commands periodically per RN. Off pressors. Met SBT criteria but failed it instantaneously; became tachypneic  SUBJECTIVE/OVERNIGHT/INTERVAL HX Extubated 9/16, but immediate stridor and difficulty w secretion. Required reintubation  Tolerating PSV and good MS this am Wide complex brady overnight >> precedex d/c'd and changed to fentanyl gtt + ativan prn  Vital Signs: Temp:  [98.1 F (36.7 C)-100.7 F (38.2 C)] 98.6 F (37 C) (09/17 0800) Pulse Rate:  [43-93] 93  (09/17 1006) Resp:  [13-30] 22  (09/17 1006) BP: (117-158)/(52-68) 151/68 mmHg (09/17 1006) SpO2:  [90 %-97 %] 93 % (09/17 1006) FiO2 (%):  [40 %-92 %] 40 % (09/17 1006) Weight:  [87.6 kg (193 lb 2 oz)] 87.6 kg (193 lb 2 oz) (09/17 0600)  Intake/Output Summary (Last 24 hours) at 10/03/11 1037 Last data filed at 10/03/11 0900  Gross per 24 hour  Intake 2339.29 ml  Output   2735 ml  Net -395.71 ml    Physical Examination: General:  Intubated, mechanically ventilated Neuro:  Follows commands RASS 0. Awake and tracks,  HEENT: PERRL Neck:  No JVD Cardiovascular:  RRR, no murmurs Lungs:  Bilateral air entry, few rales Abdomen:  Soft, bowel sounds present Musculoskeletal:  No edema Skin:  Intact.  Principal Problem:  *Cardiac arrest - ventricular fibrillation Active Problems:  HYPERTENSION  CORONARY ARTERY DISEASE  Acute  respiratory failure  Altered mental status  Acute non-ST segment elevation myocardial infarction  Hypokalemia  Sinus bradycardia  Hyperosmolality and/or hypernatremia  Dg Chest Port 1 View  10/02/2011  *RADIOLOGY REPORT*  Clinical Data: Intubation  PORTABLE CHEST - 1 VIEW  Comparison: Portable exam 1300 hours compared to 10/02/2011 at 0513 hours  Findings: Tip of endotracheal tube is 6 mm from carina. Left jugular Swan-Ganz catheter, tip projecting over SVC. Previously identified nasogastric tube is not definitely visualized. Numerous cardiac monitoring leads project over chest. Cardiac silhouette remains enlarged. Bilateral perihilar and basilar infiltrates. More focal opacity in right upper lobe persists. Atherosclerotic calcification aorta. No definite pneumothorax.  IMPRESSION: Tip of endotracheal tube 6 mm above carina, recommend withdrawal 2 cm. Otherwise no significant interval change.  Critical Value/emergent results were called by telephone at the time of interpretation on 10/02/2011 at 1315 hours to [facility] RN on [address], who verbally acknowledged these results.   Original Report Authenticated By: Lollie Marrow, M.D.    Dg Chest Port 1 View  10/02/2011  *RADIOLOGY REPORT*  Clinical Data: Intubated patient.  PORTABLE CHEST - 1 VIEW  Comparison: Chest 10/01/2011 and 09/30/2011.  CT chest 09/29/2011.  Findings: Support tubes and lines are unchanged.  Tip of the patient's endotracheal tube remains approximately 1.4 cm above the carina.  Right pleural effusion and airspace disease have worsened with no notable change on the left.  Cardiomegaly noted.  No pneumothorax.  IMPRESSION:  1.  Increased right pleural effusion and airspace disease.  Smaller left effusion and  airspace disease in the left chest appear unchanged. 2.  Endotracheal tube tip is 1.4 cm above the carina.   Original Report Authenticated By: Bernadene Bell. D'ALESSIO, M.D.      ASSESSMENT AND PLAN  PULMONARY  Lab 09/28/11 0352 09/27/11  2013 09/27/11 1353 09/27/11 1217 09/27/11 1046  PHART 7.479* 7.341* 7.310* 7.251* 7.163*  PCO2ART 34.5* 44.9 37.2 39.1 44.4  PO2ART 69.0* 113.0* 72.0* 48.0* 53.0*  HCO3 25.5* 24.3* 19.3* 17.9* 16.8*  O2SAT 94.0 98.0 95.0 84.0 86.0   Ventilator Settings: Vent Mode:  [-] CPAP FiO2 (%):  [40 %-92 %] 40 % Set Rate:  [14 bmp-24 bmp] 14 bmp Vt Set:  [500 mL] 500 mL PEEP:  [5 cmH20] 5 cmH20 Pressure Support:  [5 cmH20-10 cmH20] 10 cmH20 Plateau Pressure:  [20 cmH20-35 cmH20] 21 cmH20  CXR:  9/16 >> B scattered ASD, probable B effusions.  ETT:  9/10>>> 9/16 ETT: 9/16 (reintubated for stridor) >>   A:  Acute respiratory failure post cardiac arrest. CT 9/13 - neg for PE. Progression to extubation initially limited by MS and agitation, failed extubation 9/16 due to stridor P:   Solumedrol x another day, then consider re-extubation   CARDIOVASCULAR  Lab 10/02/11 0300 10/01/11 0500 10/01/11 0423 09/28/11 0436 09/27/11 1059 09/27/11 0556 09/27/11 0349 09/27/11 0348 09/27/11 0002 09/26/11 1935 09/26/11 1808 09/26/11 1524  TROPONINI -- -- -- -- -- 5.77* -- -- 6.94* -- 2.19* <0.30  LATICACIDVEN -- 1.6 -- 3.7* 6.5* -- -- 5.5* -- 5.1* -- --  PROBNP 1114.0* -- 1056.0* -- -- -- 544.8* -- -- -- 116.0 --   TTE:  9/10 >>> EF 45%, severe hypokinesis of inferior myocardium consistent with MI. ECG:  9/11 >>> LBBB Lines: L IJ TLC 9/10 >>  A: VT/VF arrest. NSTEMI.  Arrhythmias.  Shock, likely cardiogenic, resolved, off pressors. P:  Heparin gtt per Cardiology low dose beta blocker  Hold ACE for now ASA Statin contraindicated (elevated liver enzymes) elective cath before discharge Goal MAP 60-65  RENAL  Lab 10/03/11 0428 10/02/11 0300 10/01/11 0423 09/30/11 0500 09/29/11 0500 09/28/11 0355 09/27/11 1425 09/27/11 0600 09/26/11 1802  NA 148* 149* 149* 143 145 -- -- -- --  K 4.2 4.7 -- -- -- -- -- -- --  CL 115* 120* 117* 111 110 -- -- -- --  CO2 24 22 24 24 24  -- -- -- --  BUN 22 25* 22 20 22  --  -- -- --  CREATININE 0.60 0.62 0.56 0.65 0.86 -- -- -- --  CALCIUM 8.8 8.5 8.2* 7.6* 7.4* -- -- -- --  MG -- 2.4 2.5 -- -- 1.7 -- 1.9 1.9  PHOS -- 2.8 2.0* -- -- 2.4 2.2* 0.6* --   Intake/Output      09/16 0701 - 09/17 0700 09/17 0701 - 09/18 0700   I.V. (mL/kg) 1548.7 (17.7) 146 (1.7)   NG/GT 770 60   IV Piggyback 150    Total Intake(mL/kg) 2468.7 (28.2) 206 (2.4)   Urine (mL/kg/hr) 2760 (1.3) 200   Total Output 2760 200   Net -291.3 +6         Foley:  9/10 >>>  A: Normal renal function.  Hypokalemia and hypophos Hypernatremia P:   Trend BMP and mag and phos Start free water  GASTROINTESTINAL  Lab 09/28/11 0355 09/27/11 0349 09/26/11 1802  AST 93* 253* 293*  ALT 120* 214* 281*  ALKPHOS 73 80 111  BILITOT 0.7 0.8 0.9  PROT 5.3* 6.0 6.9  ALBUMIN  2.3* 2.9* 3.6   A:  Shocked liver, improving transaminases. P:   TF, assess for PO once extubated  HEMATOLOGIC  Lab 10/03/11 0428 10/02/11 0300 10/01/11 0423 09/30/11 0935 09/30/11 0500 09/29/11 0925 09/29/11 0500 09/28/11 1049 09/27/11 0205 09/26/11 1802  HGB 9.4* 9.4* 10.5* -- 9.2* -- 9.8* -- -- --  HCT 29.5* 29.3* 32.5* -- 28.2* -- 29.7* -- -- --  PLT 274 208 172 -- 174 -- 185 -- -- --  INR -- -- 1.77* 2.42* -- 3.51* -- 1.26 3.53* --  APTT -- -- -- -- -- -- -- -- 59* 39*   A:  Coagulopathy (Coumadin induced, exacerbated by hypothermia) - resolved. Anemia, no overt hemorrhage. P: Trend CBC PRBC for hgg < 8gm% due to MI Hold Coumadin  INFECTIOUS  Lab 10/03/11 0428 10/02/11 0300 10/01/11 0423 09/30/11 0500 09/29/11 0500 09/28/11 0355 09/27/11 0349  WBC 14.4* 10.8* 10.0 14.9* 10.7* -- --  PROCALCITON -- -- 1.66 -- -- 6.68 6.28   Cultures: 9/12  Blood >>> ntd >>  9/12  Urine >>> E.Coli 9/12  Respiratory >>> E coli 9/12  C.diff PCR >>> neg  Antibiotics: 9/12  Levaquin (empiric, E coli) >>> 9/12  Flagyl (empirical, anaerobes, C.diff) >>> 9/16  A:  UTI.  ? Legionella pneumonia (antigen negative); E coli in  sputum P: - continue levaquin - d/c flagyl 9/16  ENDOCRINE  Lab 10/03/11 0749 10/03/11 0431 10/02/11 2328 10/02/11 2018 10/02/11 1742  GLUCAP 113* 135* 178* 175* 144*   A:  Hyperglycemia.  Presumed relative adrenal insufficiency. Hydrocort stopped 09/30/11 P:   SSI Lantus  NEUROLOGIC A:  Acute encephalopathy, improving.  Significant anxiety component. P:   precedex changed to ativan prn + fentanyl gtt  BEST PRACTICES / DISPO ICU status Full code TF Protonix Heparin gtt Skin intact Family - discussed 9/17   The patient is critically ill with multiple organ systems failure and requires high complexity decision making for assessment and support, frequent evaluation and titration of therapies, application of advanced monitoring technologies and extensive interpretation of multiple databases. Critical Care Time devoted to patient care services described in this note is 35 minutes.   Levy Pupa, MD, PhD 10/03/2011, 10:37 AM [facility] [phone] or if no answer [phone]

## 2011-10-03 NOTE — Progress Notes (Signed)
ANTICOAGULATION CONSULT NOTE - Follow Up Consult  Pharmacy Consult for heparin Indication: chest pain/ACS and atrial fibrillation  Labs:  Basename 10/03/11 0428 10/02/11 2125 10/02/11 1057 10/02/11 0300 10/01/11 0423 09/30/11 0935  HGB 9.4* -- -- 9.4* -- --  HCT 29.5* -- -- 29.3* 32.5* --  PLT 274 -- -- 208 172 --  APTT -- -- -- -- -- --  LABPROT -- -- -- -- 20.9* 26.7*  INR -- -- -- -- 1.77* 2.42*  HEPARINUNFRC 0.25* 0.34 0.25* -- -- --  CREATININE -- -- -- 0.62 0.56 --  CKTOTAL -- -- -- -- -- --  CKMB -- -- -- -- -- --  TROPONINI -- -- -- -- -- --    Assessment: 76yo female now subtherapeutic on heparin after one level at low end of goal.  Goal of Therapy:  Heparin level 0.3-0.7 units/ml   Plan:  Will increase heparin gtt by 2 units/kg/hr to 1800 units/hr and check level in 6-8hr.  Colleen Can PharmD BCPS 10/03/2011,6:13 AM

## 2011-10-03 NOTE — Progress Notes (Addendum)
ANTICOAGULATION CONSULT NOTE - Follow Up Consult  Pharmacy Consult for heparin Indication:ACS/ atrial fibrillation  Allergies  Allergen Reactions  . Meperidine Hcl Other (See Comments)    Mixed with phenergan swelling of the mouth   . Penicillins Other (See Comments)    Unknown reaction  . Molds & Smuts Other (See Comments)    Runny nose  . Tape Other (See Comments)    Redness, pulls skin off    Patient Measurements: Height: 5' 3.5" (161.3 cm) Weight: 193 lb 2 oz (87.6 kg) IBW/kg (Calculated) : 53.55    Vital Signs: Temp: 98.6 F (37 C) (09/17 1146) Temp src: Core (Comment) (09/17 1146) BP: 131/54 mmHg (09/17 1400) Pulse Rate: 58  (09/17 1400)  Labs:  Basename 10/03/11 1230 10/03/11 0428 10/02/11 2125 10/02/11 0300 10/01/11 0423  HGB -- 9.4* -- 9.4* --  HCT -- 29.5* -- 29.3* 32.5*  PLT -- 274 -- 208 172  APTT -- -- -- -- --  LABPROT -- -- -- -- 20.9*  INR -- -- -- -- 1.77*  HEPARINUNFRC 0.48 0.25* 0.34 -- --  CREATININE -- 0.60 -- 0.62 0.56  CKTOTAL -- -- -- -- --  CKMB -- -- -- -- --  TROPONINI -- -- -- -- --    Estimated Creatinine Clearance: 65.5 ml/min (by C-G formula based on Cr of 0.6).   Medications:  Scheduled:    . albuterol  2.5 mg Nebulization Once  . antiseptic oral rinse  15 mL Mouth Rinse Q4H  . aspirin  325 mg Per Tube Daily  . chlorhexidine  15 mL Mouth/Throat BID  . feeding supplement  30 mL Oral QID  . feeding supplement (PROMOTE)  1,000 mL Per Tube Q24H  . free water  200 mL Per Tube Q6H  . insulin aspart  0-15 Units Subcutaneous Q4H  . insulin glargine  10 Units Subcutaneous QHS  . levofloxacin (LEVAQUIN) IV  750 mg Intravenous Q24H  . lidocaine (cardiac) 100 mg/78ml      . methylPREDNISolone (SOLU-MEDROL) injection  40 mg Intravenous Q12H  . metoprolol tartrate  12.5 mg Oral BID  . midazolam  2 mg Intravenous Once  . multivitamin  5 mL Oral Daily  . pantoprazole (PROTONIX) IV  40 mg Intravenous Q24H  . Racepinephrine HCl  0.5 mL  Nebulization Once  . rocuronium      . succinylcholine      . DISCONTD: metoprolol  2.5 mg Intravenous Q6H    Assessment: 76 y.o female now therapeutic on heparin after rate adjustment. Heparin level this AM 0.49 . Per RN no significant bleeding (slight tinged sputum after reintubation).  Goal of Therapy:  Heparin level 0.3-0.7 units/ml Monitor platelets by anticoagulation protocol: Yes   Plan:  Continue heparin rate of 1800 units/hour Check Hl with AM labs Monitor CBC and s/sx of bleeding  Bola A. Wandra Feinstein D Clinical Pharmacist Pager:[phone] Phone [phone] 10/03/2011 3:02 PM

## 2011-10-03 NOTE — Progress Notes (Signed)
Patient: Alicia Guerrero Date of Encounter: 10/03/2011, 8:14 AM Admit date: 09/26/2011     Subjective  Briefly, Alicia Guerrero was admitted with VF arrest, new WMA by echo, EF 45%, old but intermittent LBBB and PAFib with persistent respiratory failure. +Tn in setting of cardiac arrest as well as profound, but now corrected, hypokalemia.  Events overnight noted. She had bradycardia with increased ventricular ectopy. Precedex was discontinued.  This AM, Alicia Guerrero remains intubated but is alert and seems to answer questions, yes or no, appropriately. She denies pain or SOB. She recognized her husband today.   Objective  Physical Exam: Vitals: BP 151/63  Pulse 69  Temp 98.6 F (37 C) (Core (Comment))  Resp 21  Ht 5' 3.5" (1.613 m)  Wt 193 lb 2 oz (87.6 kg)  BMI 33.67 kg/m2  SpO2 96% General: Well developed 76 year old female who is intubated but in no acute distress. Neck: Supple. JVD not elevated. Lungs: Coarse breath sounds throughout anteriorly. Breathing is unlabored. Heart: RRR S1 S2 without murmurs, rubs, or gallops.  Abdomen: Soft, non-distended. Extremities: No clubbing or cyanosis. No edema.  Distal pedal pulses are 2+ and equal bilaterally. Neuro: Alert. Moves upper extremities spontaneously.   Intake/Output: Intake/Output Summary (Last 24 hours) at 10/03/11 0814 Last data filed at 10/03/11 0800  Gross per 24 hour  Intake 2487.39 ml  Output   2835 ml  Net -347.61 ml   Inpatient Medications:  . albuterol  2.5 mg Nebulization Once  . antiseptic oral rinse  15 mL Mouth Rinse Q4H  . aspirin  325 mg Per Tube Daily  . chlorhexidine  15 mL Mouth/Throat BID  . feeding supplement  30 mL Oral QID  . feeding supplement (PROMOTE)  1,000 mL Per Tube Q24H  . heparin  1,000 Units Intravenous Once  . insulin aspart  0-15 Units Subcutaneous Q4H  . insulin glargine  10 Units Subcutaneous QHS  . levofloxacin (LEVAQUIN) IV  750 mg Intravenous Q24H  . methylPREDNISolone (SOLU-MEDROL)  injection  80 mg Intravenous Once  . metoprolol  2.5 mg Intravenous Q6H  . midazolam  2 mg Intravenous Once  . multivitamin  5 mL Oral Daily  . pantoprazole (PROTONIX) IV  40 mg Intravenous Q24H  . Racepinephrine HCl  0.5 mL Nebulization Once  . DISCONTD: metronidazole  500 mg Intravenous Q8H   . sodium chloride 10 mL/hr at 10/02/11 2300  . sodium chloride 20 mL/hr at 10/02/11 1900  . fentaNYL infusion INTRAVENOUS 200 mcg/hr (10/02/11 2346)  . heparin 1,800 Units/hr (10/03/11 4782)  . DISCONTD: dexmedetomidine Stopped (10/02/11 2300)  . DISCONTD: fentaNYL infusion INTRAVENOUS 75 mcg/hr (10/02/11 0808)   Labs:  Basename 10/03/11 0428 10/02/11 0300 10/01/11 0423  NA 148* 149* --  K 4.2 4.7 --  CL 115* 120* --  CO2 24 22 --  GLUCOSE 153* 164* --  BUN 22 25* --  CREATININE 0.60 0.62 --  CALCIUM 8.8 8.5 --  MG -- 2.4 2.5  PHOS -- 2.8 2.0*   No results found for this basename: AST:2,ALT:2,ALKPHOS:2,BILITOT:2,PROT:2,ALBUMIN:2 in the last 72 hours   Basename 10/03/11 0428 10/02/11 0300  WBC 14.4* 10.8*  NEUTROABS -- --  HGB 9.4* 9.4*  HCT 29.5* 29.3*  MCV 81.7 82.1  PLT 274 208    Radiology/Studies: Dg Chest Port 1 View  10/02/2011  *RADIOLOGY REPORT*  Clinical Data: Intubation  PORTABLE CHEST - 1 VIEW  Comparison: Portable exam 1300 hours compared to 10/02/2011 at 0513 hours  Findings: Tip of endotracheal tube is 6 mm from carina. Left jugular Swan-Ganz catheter, tip projecting over SVC. Previously identified nasogastric tube is not definitely visualized. Numerous cardiac monitoring leads project over chest. Cardiac silhouette remains enlarged. Bilateral perihilar and basilar infiltrates. More focal opacity in right upper lobe persists. Atherosclerotic calcification aorta. No definite pneumothorax.  IMPRESSION: Tip of endotracheal tube 6 mm above carina, recommend withdrawal 2 cm. Otherwise no significant interval change.  Critical Value/emergent results were called by telephone  at the time of interpretation on 10/02/2011 at 1315 hours to [facility] RN on [address], who verbally acknowledged these results.   Original Report Authenticated By: Lollie Marrow, M.D.    Dg Chest Port 1 View  10/02/2011  *RADIOLOGY REPORT*  Clinical Data: Intubated patient.  PORTABLE CHEST - 1 VIEW  Comparison: Chest 10/01/2011 and 09/30/2011.  CT chest 09/29/2011.  Findings: Support tubes and lines are unchanged.  Tip of the patient's endotracheal tube remains approximately 1.4 cm above the carina.  Right pleural effusion and airspace disease have worsened with no notable change on the left.  Cardiomegaly noted.  No pneumothorax.  IMPRESSION:  1.  Increased right pleural effusion and airspace disease.  Smaller left effusion and airspace disease in the left chest appear unchanged. 2.  Endotracheal tube tip is 1.4 cm above the carina.   Original Report Authenticated By: Bernadene Bell. Maricela Curet, M.D.    Dg Chest Port 1 View  10/01/2011  *RADIOLOGY REPORT*  Clinical Data: Endotracheal tube check  PORTABLE CHEST - 1 VIEW  Comparison: Yesterday  Findings: Endotracheal tube is stable with it is tip 1.2 cm from the carina.  Diffuse bilateral airspace disease has improved.  No pneumothorax.  Left internal jugular vein central venous catheter, NG tube are stable.  No pneumothorax.  Pleural effusions may be present and small.  IMPRESSION: Improved airspace disease.  Stable endotracheal tube.   Original Report Authenticated By: Donavan Burnet, M.D.    Echocardiogram 09/26/2011: - Left ventricle: The cavity size was normal. Mild septal hypertrophy, most prominent at the base. Systolic function was mildly to moderately reduced. The estimated ejection fraction was in the range of 40% to 45%. Severe hypokinesis to akinesis of the inferior myocardium consistent with infarction. - Ventricular septum: Abnormal septal motionsecondary to LBBB. - Mitral valve: Calcified annulus. - Atrial septum: No defect or patent foramen ovale was  identified. - Pulmonary arteries: PA peak pressure: 42mm Hg (S).  Telemetry: currently sinus rhythm at 60 bpm   Assessment and Plan  1. VT/VF arrest with cardiogenic shock 2. NSTEMI with known CAD - continue ASA; also on heparin currently; BB held due to bradycardia; no statin 2/2 elevated transaminases; plan for cardiac cath when stable 3. Bradycardia - resolved now  Dr. Graciela Husbands to see and make further recommendations. Signed, EDMISTEN, BROOKE PA-C  asbove will anticipate caath prob thurs am

## 2011-10-03 NOTE — Progress Notes (Signed)
Nutrition Follow-up  Intervention:   1. Continue current TF regimen. 2. RD will continue to follow     Assessment:   RN updated by charge RN. Was extubated yesterday, but required reintubation. Remains on full vent support at this time. TF remain running at goal rate.   TF: Promote Supplements: 30 ml Pro-stat 4 times daily   Provides 1120 kcal, 105 gm protein and 604 ml free water daily.  Residuals: none Last BM: 9/15  Propofol: none   MV: 10.5 Temp:Temp (24hrs), Avg:99.3 F (37.4 C), Min:98.1 F (36.7 C), Max:100.7 F (38.2 C)    Diet Order:  NPO  Meds: Scheduled Meds:    . albuterol  2.5 mg Nebulization Once  . antiseptic oral rinse  15 mL Mouth Rinse Q4H  . aspirin  325 mg Per Tube Daily  . chlorhexidine  15 mL Mouth/Throat BID  . etomidate      . feeding supplement  30 mL Oral QID  . feeding supplement (PROMOTE)  1,000 mL Per Tube Q24H  . free water  200 mL Per Tube Q6H  . heparin  1,000 Units Intravenous Once  . insulin aspart  0-15 Units Subcutaneous Q4H  . insulin glargine  10 Units Subcutaneous QHS  . levofloxacin (LEVAQUIN) IV  750 mg Intravenous Q24H  . lidocaine (cardiac) 100 mg/10ml      . methylPREDNISolone (SOLU-MEDROL) injection  80 mg Intravenous Once  . methylPREDNISolone (SOLU-MEDROL) injection  40 mg Intravenous Q12H  . metoprolol tartrate  12.5 mg Oral BID  . midazolam  2 mg Intravenous Once  . multivitamin  5 mL Oral Daily  . pantoprazole (PROTONIX) IV  40 mg Intravenous Q24H  . Racepinephrine HCl  0.5 mL Nebulization Once  . rocuronium      . succinylcholine      . DISCONTD: metoprolol  2.5 mg Intravenous Q6H   Continuous Infusions:    . sodium chloride 10 mL/hr at 10/02/11 2300  . sodium chloride 20 mL/hr at 10/02/11 1900  . fentaNYL infusion INTRAVENOUS 200 mcg/hr (10/02/11 2346)  . heparin 1,800 Units/hr (10/03/11 1610)  . DISCONTD: dexmedetomidine Stopped (10/02/11 2300)   PRN Meds:.fentaNYL, LORazepam, DISCONTD: LORazepam,  DISCONTD: midazolam  Labs:  CMP     Component Value Date/Time   NA 148* 10/03/2011 0428   K 4.2 10/03/2011 0428   CL 115* 10/03/2011 0428   CO2 24 10/03/2011 0428   GLUCOSE 153* 10/03/2011 0428   BUN 22 10/03/2011 0428   CREATININE 0.60 10/03/2011 0428   CALCIUM 8.8 10/03/2011 0428   PROT 5.3* 09/28/2011 0355   ALBUMIN 2.3* 09/28/2011 0355   AST 93* 09/28/2011 0355   ALT 120* 09/28/2011 0355   ALKPHOS 73 09/28/2011 0355   BILITOT 0.7 09/28/2011 0355   GFRNONAA 88* 10/03/2011 0428   GFRAA >90 10/03/2011 0428     Intake/Output Summary (Last 24 hours) at 10/03/11 1117 Last data filed at 10/03/11 1100  Gross per 24 hour  Intake 2510.29 ml  Output   3235 ml  Net -724.71 ml    Weight Status:  193 lbs, down with some fluids out  Re-estimated needs:  1797 kcal (underfeeding goal 1078-1258 kcal) and 107-120 gm protein   Nutrition Dx:  Inadequate oral intake r/t inability to eat AEB mechanically ventilated   Goal:  Enteral nutrition to provide 60-70% of estimated calorie needs (22-25 kcals/kg ideal body weight) and >/= 90% of estimated protein needs, based on ASPEN guidelines for permissive underfeeding in critically ill obese individuals.  Unmet at this time  Monitor:  Vent status, TF tolerance, weight, labs   Clarene Duke RD, LDN Pager [phone] After Hours pager [phone]

## 2011-10-03 NOTE — Progress Notes (Signed)
Patient evaluated for long-term disease management services with [facility] Care Management Program as benefit of her [facility] insurance. Alicia Guerrero currently on vent. Will come back to speak with patient at later date. Spoke with husband in waiting room who is aware of [facility] Care Management services.Will continue to follow. [facility] Care Management services will not interfere or replace what is arranged by inpatient Case Management or Social Work.       Alicia Noble, MSN-Ed, RN,BSN [facility] Liaison [phone]

## 2011-10-04 ENCOUNTER — Inpatient Hospital Stay (HOSPITAL_COMMUNITY): Payer: Medicare Other

## 2011-10-04 DIAGNOSIS — I4901 Ventricular fibrillation: Secondary | ICD-10-CM

## 2011-10-04 DIAGNOSIS — I4891 Unspecified atrial fibrillation: Secondary | ICD-10-CM

## 2011-10-04 LAB — CBC
HCT: 31.6 % — ABNORMAL LOW (ref 36.0–46.0)
Hemoglobin: 10 g/dL — ABNORMAL LOW (ref 12.0–15.0)
RDW: 15.2 % (ref 11.5–15.5)
WBC: 19.9 10*3/uL — ABNORMAL HIGH (ref 4.0–10.5)

## 2011-10-04 LAB — CULTURE, BLOOD (ROUTINE X 2): Culture: NO GROWTH

## 2011-10-04 LAB — PHOSPHORUS: Phosphorus: 4 mg/dL (ref 2.3–4.6)

## 2011-10-04 LAB — BASIC METABOLIC PANEL
BUN: 22 mg/dL (ref 6–23)
CO2: 31 mEq/L (ref 19–32)
Calcium: 8.8 mg/dL (ref 8.4–10.5)
Creatinine, Ser: 0.51 mg/dL (ref 0.50–1.10)
Glucose, Bld: 210 mg/dL — ABNORMAL HIGH (ref 70–99)
Sodium: 142 mEq/L (ref 135–145)

## 2011-10-04 LAB — COMPREHENSIVE METABOLIC PANEL
AST: 20 U/L (ref 0–37)
Albumin: 2.2 g/dL — ABNORMAL LOW (ref 3.5–5.2)
Calcium: 8.6 mg/dL (ref 8.4–10.5)
Creatinine, Ser: 0.52 mg/dL (ref 0.50–1.10)

## 2011-10-04 LAB — PRO B NATRIURETIC PEPTIDE: Pro B Natriuretic peptide (BNP): 5880 pg/mL — ABNORMAL HIGH (ref 0–125)

## 2011-10-04 LAB — PROTIME-INR: INR: 1.51 — ABNORMAL HIGH (ref 0.00–1.49)

## 2011-10-04 LAB — HEPARIN LEVEL (UNFRACTIONATED): Heparin Unfractionated: 0.58 IU/mL (ref 0.30–0.70)

## 2011-10-04 LAB — GLUCOSE, CAPILLARY: Glucose-Capillary: 185 mg/dL — ABNORMAL HIGH (ref 70–99)

## 2011-10-04 MED ORDER — POTASSIUM CHLORIDE 10 MEQ/50ML IV SOLN
10.0000 meq | INTRAVENOUS | Status: AC
  Administered 2011-10-04 (×2): 10 meq via INTRAVENOUS
  Filled 2011-10-04: qty 100

## 2011-10-04 MED ORDER — POTASSIUM CHLORIDE 20 MEQ/15ML (10%) PO LIQD
40.0000 meq | Freq: Once | ORAL | Status: AC
  Administered 2011-10-04: 40 meq
  Filled 2011-10-04: qty 30

## 2011-10-04 MED ORDER — ETOMIDATE 2 MG/ML IV SOLN
40.0000 mg | Freq: Once | INTRAVENOUS | Status: DC
  Filled 2011-10-04: qty 10
  Filled 2011-10-04: qty 20

## 2011-10-04 MED ORDER — VECURONIUM BROMIDE 10 MG IV SOLR: 10.0000 mg | Freq: Once | INTRAVENOUS | Status: DC

## 2011-10-04 MED ORDER — FUROSEMIDE 10 MG/ML IJ SOLN
INTRAMUSCULAR | Status: AC
  Filled 2011-10-04: qty 4

## 2011-10-04 MED ORDER — INSULIN ASPART 100 UNIT/ML ~~LOC~~ SOLN
0.0000 [IU] | SUBCUTANEOUS | Status: DC
  Administered 2011-10-04: 4 [IU] via SUBCUTANEOUS
  Administered 2011-10-04: 1 [IU] via SUBCUTANEOUS
  Administered 2011-10-04: 7 [IU] via SUBCUTANEOUS
  Administered 2011-10-04 (×2): 4 [IU] via SUBCUTANEOUS
  Administered 2011-10-05 (×3): 3 [IU] via SUBCUTANEOUS
  Administered 2011-10-05 (×2): 4 [IU] via SUBCUTANEOUS
  Administered 2011-10-05: 3 [IU] via SUBCUTANEOUS
  Administered 2011-10-06 (×2): 4 [IU] via SUBCUTANEOUS
  Administered 2011-10-06: 3 [IU] via SUBCUTANEOUS
  Administered 2011-10-06: 4 [IU] via SUBCUTANEOUS
  Administered 2011-10-06: 3 [IU] via SUBCUTANEOUS
  Administered 2011-10-07: 4 [IU] via SUBCUTANEOUS
  Administered 2011-10-07 (×2): 3 [IU] via SUBCUTANEOUS
  Administered 2011-10-07: 4 [IU] via SUBCUTANEOUS
  Administered 2011-10-07 – 2011-10-08 (×4): 3 [IU] via SUBCUTANEOUS
  Administered 2011-10-08: 4 [IU] via SUBCUTANEOUS
  Administered 2011-10-08 – 2011-10-09 (×5): 3 [IU] via SUBCUTANEOUS
  Administered 2011-10-10: 4 [IU] via SUBCUTANEOUS
  Administered 2011-10-10: 3 [IU] via SUBCUTANEOUS

## 2011-10-04 MED ORDER — FENTANYL CITRATE 0.05 MG/ML IJ SOLN
200.0000 ug | Freq: Once | INTRAMUSCULAR | Status: AC
  Administered 2011-10-05: 300 ug via INTRAVENOUS

## 2011-10-04 MED ORDER — FUROSEMIDE 10 MG/ML IJ SOLN
40.0000 mg | Freq: Two times a day (BID) | INTRAMUSCULAR | Status: AC
  Administered 2011-10-04 (×2): 40 mg via INTRAVENOUS

## 2011-10-04 MED ORDER — INSULIN GLARGINE 100 UNIT/ML ~~LOC~~ SOLN
15.0000 [IU] | Freq: Every day | SUBCUTANEOUS | Status: DC
  Administered 2011-10-04 – 2011-10-11 (×8): 15 [IU] via SUBCUTANEOUS

## 2011-10-04 MED ORDER — PROPOFOL 10 MG/ML IV EMUL
5.0000 ug/kg/min | Freq: Once | INTRAVENOUS | Status: AC
  Administered 2011-10-05: 20 ug/kg/min via INTRAVENOUS
  Filled 2011-10-04: qty 100

## 2011-10-04 MED ORDER — MIDAZOLAM HCL 2 MG/2ML IJ SOLN
5.0000 mg | Freq: Once | INTRAMUSCULAR | Status: AC
  Administered 2011-10-05: 6 mg via INTRAVENOUS
  Filled 2011-10-04: qty 6

## 2011-10-04 NOTE — Progress Notes (Signed)
eEcho 40-45% with new IMI wall motion abnormality Plan is for trach tomorrow or Friday Will plan cath for mon Will need orders D/c  scds

## 2011-10-04 NOTE — Progress Notes (Signed)
Rt called to pts bedside for extubation per Dr Delton Coombes. Pt had negative cuff leak. Per Dr Delton Coombes no extubation. Pt remains on cpap/psv. Increased psv to 10. Pt tol well.

## 2011-10-04 NOTE — Progress Notes (Signed)
Patient Name: Alicia Guerrero Date of Encounter: 10/04/2011   Principal Problem:  *Cardiac arrest - ventricular fibrillation Active Problems:  HYPERTENSION  CORONARY ARTERY DISEASE  Acute respiratory failure  Altered mental status  Acute non-ST segment elevation myocardial infarction  Hypokalemia  Sinus bradycardia  Hyperosmolality and/or hypernatremia  PAF (paroxysmal atrial fibrillation)    SUBJECTIVE  Intubated, sedated.  CURRENT MEDS    . albuterol  2.5 mg Nebulization Once  . antiseptic oral rinse  15 mL Mouth Rinse Q4H  . aspirin  325 mg Per Tube Daily  . chlorhexidine  15 mL Mouth/Throat BID  . feeding supplement  30 mL Oral QID  . feeding supplement (PROMOTE)  1,000 mL Per Tube Q24H  . free water  200 mL Per Tube Q6H  . insulin aspart  0-15 Units Subcutaneous Q4H  . insulin glargine  10 Units Subcutaneous QHS  . levofloxacin (LEVAQUIN) IV  750 mg Intravenous Q24H  . methylPREDNISolone (SOLU-MEDROL) injection  40 mg Intravenous Q12H  . metoprolol tartrate  12.5 mg Oral BID  . multivitamin  5 mL Oral Daily  . pantoprazole (PROTONIX) IV  40 mg Intravenous Q24H  . Racepinephrine HCl  0.5 mL Nebulization Once  . DISCONTD: metoprolol  2.5 mg Intravenous Q6H  . DISCONTD: metoprolol tartrate  12.5 mg Oral BID    OBJECTIVE  Filed Vitals:   10/04/11 0400 10/04/11 0500 10/04/11 0600 10/04/11 0626  BP: 131/55 138/56  151/53  Pulse: 49 53 66 62  Temp: 97.9 F (36.6 C)     TempSrc: Core (Comment)     Resp: 14 14 18 17   Height:      Weight:      SpO2: 94% 93% 95% 94%    Intake/Output Summary (Last 24 hours) at 10/04/11 0656 Last data filed at 10/04/11 0600  Gross per 24 hour  Intake 3605.1 ml  Output   4700 ml  Net -1094.9 ml   Filed Weights   10/01/11 0449 10/02/11 0500 10/03/11 0600  Weight: 194 lb 3.6 oz (88.1 kg) 199 lb 11.8 oz (90.6 kg) 193 lb 2 oz (87.6 kg)   CVP = 10 (0400) PHYSICAL EXAM  General: Intubated, sedated. Neuro: Intubated,  sedated. Psych: Intubated, sedated. HEENT:  Normal  Neck: Supple without bruits or JVD. Lungs:  Resp regular and unlabored, coarse breath sounds bilat. Heart: RRR no s3, s4.  2/6 sm @ apex.. Abdomen: Soft, non-tender, non-distended, BS + x 4.  Extremities: No clubbing, cyanosis or edema. DP/PT/Radials 2+ and equal bilaterally.  Accessory Clinical Findings  CBC  Basename 10/04/11 0425 10/03/11 0428  WBC 19.9* 14.4*  NEUTROABS -- --  HGB 10.0* 9.4*  HCT 31.6* 29.5*  MCV 82.5 81.7  PLT 332 274   Basic Metabolic Panel  Basename 10/04/11 0425 10/03/11 0428 10/02/11 0300  NA 143 148* --  K 4.3 4.2 --  CL 107 115* --  CO2 28 24 --  GLUCOSE 222* 153* --  BUN 21 22 --  CREATININE 0.52 0.60 --  CALCIUM 8.6 8.8 --  MG 2.0 -- 2.4  PHOS 4.0 -- 2.8   Liver Function Tests  Basename 10/04/11 0425  AST 20  ALT 36*  ALKPHOS 84  BILITOT 0.3  PROT 5.5*  ALBUMIN 2.2*   TELE  Sinus rhythm, pvc's.  2D Echocardiogram 09/26/2011  Study Conclusions  - Left ventricle: The cavity size was normal. Mild septal hypertrophy, most prominent at the base. Systolic function was mildly to moderately reduced. The estimated  ejection fraction was in the range of 40% to 45%. Severe hypokinesis to akinesis of the inferior myocardium consistent with infarction. - Ventricular septum: Abnormal septal motionsecondary to LBBB. - Mitral valve: Calcified annulus. - Atrial septum: No defect or patent foramen ovale was identified. - Pulmonary arteries: PA peak pressure: 42mm Hg (S). _____________  ASSESSMENT AND PLAN  1. VF Arrest/NSTEMI/Presumed Ischemic Cardiomyopathy: s/p hypothermia protocol.  Remains intubated/sedated.  Hemodynamically stable.  Volume stable.  Cont bb, asa.  No statin 2/2 elevated lft's (improving).  Eventual cath.  2.  VDRF:  Required reintubation 9/16.  Steroids/abx per CCM.  3.  Leukocytosis:  In setting of IV solumedrol for stridor.  Afebrile.  On  levaquin.  Signed, Nicolasa Ducking NP

## 2011-10-04 NOTE — Progress Notes (Signed)
ANTICOAGULATION CONSULT NOTE - Follow Up Consult  Pharmacy Consult for heparin Indication:ACS/ atrial fibrillation  Allergies  Allergen Reactions  . Meperidine Hcl Other (See Comments)    Mixed with phenergan swelling of the mouth   . Penicillins Other (See Comments)    Unknown reaction  . Molds & Smuts Other (See Comments)    Runny nose  . Tape Other (See Comments)    Redness, pulls skin off    Patient Measurements: Height: 5' 3.5" (161.3 cm) Weight: 193 lb 2 oz (87.6 kg) IBW/kg (Calculated) : 53.55    Vital Signs: Temp: 98.2 F (36.8 C) (09/18 0814) Temp src: Core (Comment) (09/18 0814) BP: 129/47 mmHg (09/18 0700) Pulse Rate: 55  (09/18 0700)  Labs:  Basename 10/04/11 0425 10/03/11 1230 10/03/11 0428 10/02/11 0300  HGB 10.0* -- 9.4* --  HCT 31.6* -- 29.5* 29.3*  PLT 332 -- 274 208  APTT -- -- -- --  LABPROT -- -- -- --  INR -- -- -- --  HEPARINUNFRC 0.58 0.48 0.25* --  CREATININE 0.52 -- 0.60 0.62  CKTOTAL -- -- -- --  CKMB -- -- -- --  TROPONINI -- -- -- --    Estimated Creatinine Clearance: 65.5 ml/min (by C-G formula based on Cr of 0.52).   Medications:  Scheduled:     . albuterol  2.5 mg Nebulization Once  . antiseptic oral rinse  15 mL Mouth Rinse Q4H  . aspirin  325 mg Per Tube Daily  . chlorhexidine  15 mL Mouth/Throat BID  . feeding supplement  30 mL Oral QID  . feeding supplement (PROMOTE)  1,000 mL Per Tube Q24H  . free water  200 mL Per Tube Q6H  . insulin aspart  0-15 Units Subcutaneous Q4H  . insulin glargine  10 Units Subcutaneous QHS  . levofloxacin (LEVAQUIN) IV  750 mg Intravenous Q24H  . methylPREDNISolone (SOLU-MEDROL) injection  40 mg Intravenous Q12H  . metoprolol tartrate  12.5 mg Oral BID  . multivitamin  5 mL Oral Daily  . pantoprazole (PROTONIX) IV  40 mg Intravenous Q24H  . Racepinephrine HCl  0.5 mL Nebulization Once  . DISCONTD: metoprolol  2.5 mg Intravenous Q6H  . DISCONTD: metoprolol tartrate  12.5 mg Oral BID     Assessment: 76 y.o female with NSTEMI and hx of afib (on coumadin PTA) now therapeutic on heparin after rate adjustment. Heparin level this AM 0.58 . Noted plans for cath when stable  Goal of Therapy:  Heparin level 0.3-0.7 units/ml Monitor platelets by anticoagulation protocol: Yes   Plan:  -Continue heparin rate of 1800 units/hour -Daily heparin level and CBC  Harland German, Pharm D 10/04/2011 8:40 AM

## 2011-10-04 NOTE — Progress Notes (Signed)
Name: Alicia Guerrero MRN: [id number] DOB: [birth year]    LOS: 8 Date of admit : 09/26/2011 PCP is Illene Regulus, MD   Referring Provider:  [facility] Reason for Referral:  Cardiac arrest  PULMONARY / CRITICAL CARE MEDICINE  Patient summary:  76 yo with extensive cardiac history brought to [facility] after VT/VF arrest.  Transferred to [facility] for hypothermia protocol.  Events Since Admission: 9/10  VT/VF arrest, intubated, hypothermia initiated 9/11  Hypothermia stopped as multiple arrhythmias and coagulopathy 9/13:   Off pressors.  RN reports significant anxiety / agitation off sedation. 9/14:  - Follows commands periodically per RN. Off pressors. Met SBT criteria but failed it instantaneously; became tachypneic  SUBJECTIVE/OVERNIGHT/INTERVAL HX Extubated 9/16, but immediate stridor and difficulty w secretion. Required reintubation  Tolerating PSV, remains on fentanyl this am Wide complex brady overnight >> precedex d/c'd and changed to fentanyl gtt + ativan prn  Vital Signs: Temp:  [97.9 F (36.6 C)-98.6 F (37 C)] 98.2 F (36.8 C) (09/18 0814) Pulse Rate:  [49-93] 55  (09/18 0700) Resp:  [7-27] 15  (09/18 0700) BP: (125-161)/(47-72) 129/47 mmHg (09/18 0700) SpO2:  [90 %-97 %] 94 % (09/18 0700) FiO2 (%):  [40 %] 40 % (09/18 0828)  Intake/Output Summary (Last 24 hours) at 10/04/11 0847 Last data filed at 10/04/11 0800  Gross per 24 hour  Intake   3612 ml  Output   4625 ml  Net  -1013 ml    Physical Examination: General:  Intubated, mechanically ventilated Neuro:  Follows commands RASS 0. Awake and tracks,  HEENT: PERRL Neck:  No JVD Cardiovascular:  RRR, no murmurs Lungs:  Bilateral air entry, few rales Abdomen:  Soft, bowel sounds present Musculoskeletal:  No edema Skin:  Intact.  Principal Problem:  *Cardiac arrest - ventricular fibrillation Active Problems:  HYPERTENSION  CORONARY ARTERY DISEASE  Acute respiratory failure  Altered mental status  Acute non-ST segment  elevation myocardial infarction  Hypokalemia  Sinus bradycardia  Hyperosmolality and/or hypernatremia  PAF (paroxysmal atrial fibrillation)  Dg Chest Port 1 View  10/04/2011  *RADIOLOGY REPORT*  Clinical Data: Evaluate endotracheal tube and central venous lines  PORTABLE CHEST - 1 VIEW  Comparison: Portable chest x-ray of 10/02/2011  Findings: The tip of the endotracheal tube is still approximately 1.3 cm above the carina.  There is now more diffuse airspace disease most consistent with edema and bilateral pleural effusions with cardiomegaly.  A left central venous line remains with the tip overlying the mid SVC.  No pneumothorax is noted.  IMPRESSION:  1.  Tip of endotracheal tube only 1.3 cm above carina. 2.  Worsening of apparent CHF with congestion, cardiomegaly, and effusions.   Original Report Authenticated By: Juline Patch, M.D.    Dg Chest Port 1 View  10/02/2011  *RADIOLOGY REPORT*  Clinical Data: Intubation  PORTABLE CHEST - 1 VIEW  Comparison: Portable exam 1300 hours compared to 10/02/2011 at 0513 hours  Findings: Tip of endotracheal tube is 6 mm from carina. Left jugular Swan-Ganz catheter, tip projecting over SVC. Previously identified nasogastric tube is not definitely visualized. Numerous cardiac monitoring leads project over chest. Cardiac silhouette remains enlarged. Bilateral perihilar and basilar infiltrates. More focal opacity in right upper lobe persists. Atherosclerotic calcification aorta. No definite pneumothorax.  IMPRESSION: Tip of endotracheal tube 6 mm above carina, recommend withdrawal 2 cm. Otherwise no significant interval change.  Critical Value/emergent results were called by telephone at the time of interpretation on 10/02/2011 at 1315 hours to  Pam RN on [address], who verbally acknowledged these results.   Original Report Authenticated By: Lollie Marrow, M.D.      ASSESSMENT AND PLAN  PULMONARY  Lab 09/28/11 0352 09/27/11 2013 09/27/11 1353 09/27/11 1217  09/27/11 1046  PHART 7.479* 7.341* 7.310* 7.251* 7.163*  PCO2ART 34.5* 44.9 37.2 39.1 44.4  PO2ART 69.0* 113.0* 72.0* 48.0* 53.0*  HCO3 25.5* 24.3* 19.3* 17.9* 16.8*  O2SAT 94.0 98.0 95.0 84.0 86.0   Ventilator Settings: Vent Mode:  [-] PSV;CPAP FiO2 (%):  [40 %] 40 % Set Rate:  [14 bmp] 14 bmp Vt Set:  [500 mL] 500 mL PEEP:  [5 cmH20] 5 cmH20 Pressure Support:  [5 cmH20-10 cmH20] 5 cmH20 Plateau Pressure:  [16 cmH20-21 cmH20] 21 cmH20  CXR:  9/18 >> progressive B edema pattern, small B effusions.  ETT:  9/10>>> 9/16 ETT: 9/16 (reintubated for stridor) >>   A:  Acute respiratory failure post cardiac arrest. CT 9/13 - neg for PE. Progression to extubation initially limited by MS and agitation, failed extubation 9/16 due to stridor P:   - tolerating PSV, will reconsider for extubation 9/18. If she fails will need to consider tracheostomy (likely short-term) to allow UA improvement, MS improvement.  - empiric diuresis today for apparent pulm edema; she was net-negative last 24 hours, will need to also consider ALI or infxn (on Levaquin) - continue steroids for now, consider d/c 9/19  CARDIOVASCULAR  Lab 10/04/11 0425 10/02/11 0300 10/01/11 0500 10/01/11 0423 09/28/11 0436 09/27/11 1059  TROPONINI -- -- -- -- -- --  LATICACIDVEN -- -- 1.6 -- 3.7* 6.5*  PROBNP 5880.0* 1114.0* -- 1056.0* -- --   TTE:  9/10 >>> EF 45%, severe hypokinesis of inferior myocardium consistent with MI. ECG:  9/11 >>> LBBB Lines: L IJ TLC 9/10 >>  A: VT/VF arrest. NSTEMI.  Arrhythmias.  Shock, likely cardiogenic, resolved, off pressors. P:  Heparin gtt per Cardiology Lasix 9/18 for apparent pulm edema CXR, rising BNP low dose beta blocker  Hold ACE for now ASA ? Start statin soon as LFT's have normalized, will defer to cards elective cath before discharge   RENAL  Lab 10/04/11 0425 10/03/11 0428 10/02/11 0300 10/01/11 0423 09/30/11 0500 09/28/11 0355 09/27/11 1425  NA 143 148* 149* 149* 143 -- --   K 4.3 4.2 -- -- -- -- --  CL 107 115* 120* 117* 111 -- --  CO2 28 24 22 24 24  -- --  BUN 21 22 25* 22 20 -- --  CREATININE 0.52 0.60 0.62 0.56 0.65 -- --  CALCIUM 8.6 8.8 8.5 8.2* 7.6* -- --  MG 2.0 -- 2.4 2.5 -- 1.7 --  PHOS 4.0 -- 2.8 2.0* -- 2.4 2.2*   Intake/Output      09/17 0701 - 09/18 0700 09/18 0701 - 09/19 0700   I.V. (mL/kg) 1752 (20) 73 (0.8)   NG/GT 1860 30   IV Piggyback 0    Total Intake(mL/kg) 3612 (41.2) 103 (1.2)   Urine (mL/kg/hr) 4700 (2.2) 125   Total Output 4700 125   Net -1088 -22         Foley:  9/10 >>>  A: Normal renal function.  Hypokalemia and hypophos Hypernatremia P:   Trend BMP and mag and phos Hyponatremia improved with free water  GASTROINTESTINAL  Lab 10/04/11 0425 09/28/11 0355  AST 20 93*  ALT 36* 120*  ALKPHOS 84 73  BILITOT 0.3 0.7  PROT 5.5* 5.3*  ALBUMIN 2.2* 2.3*  A:  Shocked liver, improved transaminases. P:   TF, assess for PO once extubated  HEMATOLOGIC  Lab 10/04/11 0425 10/03/11 0428 10/02/11 0300 10/01/11 0423 09/30/11 0935 09/30/11 0500 09/29/11 0925 09/28/11 1049  HGB 10.0* 9.4* 9.4* 10.5* -- 9.2* -- --  HCT 31.6* 29.5* 29.3* 32.5* -- 28.2* -- --  PLT 332 274 208 172 -- 174 -- --  INR -- -- -- 1.77* 2.42* -- 3.51* 1.26  APTT -- -- -- -- -- -- -- --   A:  Coagulopathy (Coumadin induced, exacerbated by hypothermia) - resolved. Anemia, no overt hemorrhage. P: Trend CBC PRBC for hgg < 8gm% due to MI Holding Coumadin  INFECTIOUS  Lab 10/04/11 0425 10/03/11 0428 10/02/11 0300 10/01/11 0423 09/30/11 0500 09/28/11 0355  WBC 19.9* 14.4* 10.8* 10.0 14.9* --  PROCALCITON -- -- -- 1.66 -- 6.68   Cultures: 9/12  Blood >>> ntd >>  9/12  Urine >>> E.Coli 9/12  Respiratory >>> E coli 9/12  C.diff PCR >>> neg  Antibiotics: 9/12  Levaquin (empiric, E coli) >>> 9/12  Flagyl (empirical, anaerobes, C.diff) >>> 9/16  A:  UTI.  ? Legionella pneumonia (antigen negative); E coli in sputum P: - continue levaquin -  d/c flagyl 9/16  ENDOCRINE  Lab 10/04/11 0814 10/04/11 0433 10/03/11 2322 10/03/11 1924 10/03/11 1545  GLUCAP 185* 198* 192* 165* 167*   A:  Hyperglycemia.  Presumed relative adrenal insufficiency. Hydrocort stopped 09/30/11 P:   SSI Lantus  NEUROLOGIC A:  Acute encephalopathy, improving.  Significant anxiety component. P:   precedex changed to ativan prn + fentanyl gtt (due to wide complex brady)  BEST PRACTICES / DISPO ICU status Full code TF Protonix Heparin gtt Skin intact Family - discussed 9/18   The patient is critically ill with multiple organ systems failure and requires high complexity decision making for assessment and support, frequent evaluation and titration of therapies, application of advanced monitoring technologies and extensive interpretation of multiple databases. Critical Care Time devoted to patient care services described in this note is 35 minutes.   Levy Pupa, MD, PhD 10/04/2011, 8:47 AM [facility] [phone] or if no answer [phone]

## 2011-10-05 ENCOUNTER — Inpatient Hospital Stay (HOSPITAL_COMMUNITY): Payer: Medicare Other

## 2011-10-05 LAB — GLUCOSE, CAPILLARY
Glucose-Capillary: 133 mg/dL — ABNORMAL HIGH (ref 70–99)
Glucose-Capillary: 142 mg/dL — ABNORMAL HIGH (ref 70–99)

## 2011-10-05 LAB — BASIC METABOLIC PANEL
Calcium: 8.8 mg/dL (ref 8.4–10.5)
Chloride: 103 mEq/L (ref 96–112)
Creatinine, Ser: 0.58 mg/dL (ref 0.50–1.10)
GFR calc Af Amer: >90 mL/min (ref >90)
Sodium: 142 mEq/L (ref 135–145)

## 2011-10-05 LAB — CBC
HCT: 32.2 % — ABNORMAL LOW (ref 36.0–46.0)
Platelets: 381 10*3/uL (ref 150–400)
RBC: 3.94 MIL/uL (ref 3.87–5.11)
RDW: 15 % (ref 11.5–15.5)
WBC: 19.4 10*3/uL — ABNORMAL HIGH (ref 4.0–10.5)

## 2011-10-05 LAB — HEPARIN LEVEL (UNFRACTIONATED): Heparin Unfractionated: 0.84 IU/mL — ABNORMAL HIGH (ref 0.30–0.70)

## 2011-10-05 MED ORDER — ETOMIDATE 2 MG/ML IV SOLN
INTRAVENOUS | Status: AC
  Filled 2011-10-05: qty 20

## 2011-10-05 MED ORDER — ROCURONIUM BROMIDE 50 MG/5ML IV SOLN
INTRAVENOUS | Status: AC
  Filled 2011-10-05: qty 2

## 2011-10-05 MED ORDER — SUCCINYLCHOLINE CHLORIDE 20 MG/ML IJ SOLN
INTRAMUSCULAR | Status: AC
  Filled 2011-10-05: qty 10

## 2011-10-05 MED ORDER — FENTANYL CITRATE 0.05 MG/ML IJ SOLN: 25.0000 ug | INTRAMUSCULAR | Status: AC | PRN

## 2011-10-05 MED ORDER — PROMOTE PO LIQD
1000.0000 mL | ORAL | Status: DC
  Administered 2011-10-05 – 2011-10-08 (×4): 1000 mL
  Filled 2011-10-05 (×6): qty 1000

## 2011-10-05 MED ORDER — HEPARIN (PORCINE) IN NACL 100-0.45 UNIT/ML-% IJ SOLN
1700.0000 [IU]/h | INTRAMUSCULAR | Status: DC
  Filled 2011-10-05 (×2): qty 250

## 2011-10-05 MED ORDER — LIDOCAINE HCL (CARDIAC) 20 MG/ML IV SOLN
INTRAVENOUS | Status: AC
  Filled 2011-10-05: qty 5

## 2011-10-05 MED ORDER — FUROSEMIDE 10 MG/ML IJ SOLN
20.0000 mg | Freq: Two times a day (BID) | INTRAMUSCULAR | Status: AC
  Administered 2011-10-05 (×2): 20 mg via INTRAVENOUS
  Filled 2011-10-05 (×2): qty 2

## 2011-10-05 MED ORDER — FUROSEMIDE 10 MG/ML IJ SOLN
INTRAMUSCULAR | Status: AC
  Filled 2011-10-05: qty 4

## 2011-10-05 MED ORDER — PRO-STAT SUGAR FREE PO LIQD
30.0000 mL | Freq: Every day | ORAL | Status: DC
  Administered 2011-10-05 – 2011-10-09 (×19): 30 mL via ORAL
  Filled 2011-10-05 (×24): qty 30

## 2011-10-05 NOTE — Op Note (Signed)
NAMEMarland Guerrero  [name], [name] NO.:  [id number]  MEDICAL RECORD NO.:  [id number]  LOCATION:  2903                         FACILITY:  [facility]  PHYSICIAN:  Nelda Bucks, MD DATE OF BIRTH:  [birth year]  DATE OF PROCEDURE: DATE OF DISCHARGE:                              OPERATIVE REPORT   PREOPERATIVE DIAGNOSES:  Coronary artery disease, acute respiratory failure, upper airway stridor upon extubation.  POSTOPERATIVE DIAGNOSES:  Status post tracheostomy secondary to stridor upon extubation in the setting of acute respiratory failure.  OPERATION:  Percutaneous tracheostomy.  The patient's heparin drip was held 4 hours preprocedure.  The patient was placed in a supine position.  Chlorhexidine preparation was used to sterilize the operative site.  Consent was obtained from the patient's family members including son. Fully aware of risks and benefits of the procedure including infection, bleeding, pneumothorax, and death. Bronchoscopist for the procedure is Dr. Levy Pupa.  Bronchoscopist placed a bronchoscope through the endotracheal tube and placed and pulled back the endotracheal tube to approximately 17 cm given the ample room for the procedure.  The ventilator was adjusted to give adequate ventilation enhancement oxygen, 6 mL of lidocaine with epinephrine was injected over the second endotracheal space.  A 1 cm vertical incision was made over that space.  Dissection was done to the fat pads and to the deep tissues.  I did note continuous arterial small bleeding coming from the fat pad at the inferior portion of the wound.  Cautery was applied successfully through this area with hemostasis.  There was no anterior jugular venous structures noted.  Dissection was continued where we identified the strap muscles and dissected the strap muscles down to the tracheal planes.  I then placed an 18-gauge needle over white catheter sheath into the airway successfully without any  posterior wall injury.  The needles were removed.  White catheter sheath remained. Wire was placed through the white catheter sheath successfully and the white catheter sheath was removed.  I then placed a 14-French punch dilator over the wire successfully in and out of the airway.  Then placed a progressive rhino dilator to approximately 30-French which was removed.  The wire remained.  I then placed the 26-French dilator through a 6-French size Shiley tracheostomy over the wire and glider successfully into the airway.  Everything was removed except for the tracheostomy.  The tracheostomy balloon was insufflated with 8 mL of air.  The tracheostomy was sutured in place with 4 sutures on each corner with a monofilament 0 silk sutures.  The Chlorhexidine preparation was used to sterilize the suture sites.  Bronchoscopist placed a bronchoscope through the new tracheostomy and noted the carina approximately 4 cm below without any posterior wall injury or bleeding.  The patient tolerated the procedure quite well.  First assistant for the procedure was Dr. Janalyn Harder MD.  Blood loss for the procedure was less than 5 mL.     Nelda Bucks, MD     DJF/MEDQ  D:  10/05/2011  T:  10/05/2011  Job:  [id number]  cc:   Leslye Peer, MD Duke Salvia, MD, [facility]

## 2011-10-05 NOTE — Progress Notes (Addendum)
Bedside trach performed w/ use of video bronchoscope intervention, bronchial washing intervention performed also.

## 2011-10-05 NOTE — Progress Notes (Signed)
ANTICOAGULATION CONSULT NOTE - Follow Up Consult  Pharmacy Consult for Heparin Indication: ACS / atrial fibrillation  Allergies  Allergen Reactions  . Meperidine Hcl Other (See Comments)    Mixed with phenergan swelling of the mouth   . Penicillins Other (See Comments)    Unknown reaction  . Molds & Smuts Other (See Comments)    Runny nose  . Tape Other (See Comments)    Redness, pulls skin off    Patient Measurements: Height: 5' 3.5" (161.3 cm) Weight: 180 lb 12.4 oz (82 kg) IBW/kg (Calculated) : 53.55  Heparin Dosing Weight: 71.5 kg Vital Signs: Temp: 99.1 F (37.3 C) (09/19 1200) Temp src: Core (Comment) (09/19 1200) BP: 153/63 mmHg (09/19 1330) Pulse Rate: 55  (09/19 1330)  Labs:  Basename 10/05/11 0400 10/04/11 1807 10/04/11 1610 10/04/11 0425 10/03/11 1230 10/03/11 0428  HGB 10.2* -- -- 10.0* -- --  HCT 32.2* -- -- 31.6* -- 29.5*  PLT 381 -- -- 332 -- 274  APTT -- -- -- -- -- --  LABPROT -- -- 17.8* -- -- --  INR -- -- 1.51* -- -- --  HEPARINUNFRC 0.84* -- -- 0.58 0.48 --  CREATININE 0.58 0.51 -- 0.52 -- --  CKTOTAL -- -- -- -- -- --  CKMB -- -- -- -- -- --  TROPONINI -- -- -- -- -- --    Estimated Creatinine Clearance: 63.3 ml/min (by C-G formula based on Cr of 0.58).   Medications:  Scheduled:    . albuterol  2.5 mg Nebulization Once  . antiseptic oral rinse  15 mL Mouth Rinse Q4H  . aspirin  325 mg Per Tube Daily  . chlorhexidine  15 mL Mouth/Throat BID  . etomidate      . etomidate  40 mg Intravenous Once  . feeding supplement  30 mL Oral 5 X Daily  . feeding supplement (PROMOTE)  1,000 mL Per Tube Q24H  . fentaNYL  200 mcg Intravenous Once  . free water  200 mL Per Tube Q6H  . furosemide      . furosemide  20 mg Intravenous Q12H  . furosemide  40 mg Intravenous Q12H  . insulin aspart  0-20 Units Subcutaneous Q4H  . insulin glargine  15 Units Subcutaneous QHS  . levofloxacin (LEVAQUIN) IV  750 mg Intravenous Q24H  . lidocaine (cardiac) 100  mg/56ml      . methylPREDNISolone (SOLU-MEDROL) injection  40 mg Intravenous Q12H  . metoprolol tartrate  12.5 mg Oral BID  . midazolam  5 mg Intravenous Once  . multivitamin  5 mL Oral Daily  . pantoprazole (PROTONIX) IV  40 mg Intravenous Q24H  . potassium chloride  10 mEq Intravenous Q1 Hr x 2  . potassium chloride  40 mEq Per Tube Once  . propofol  5-70 mcg/kg/min Intravenous Once  . Racepinephrine HCl  0.5 mL Nebulization Once  . rocuronium      . succinylcholine      . vecuronium  10 mg Intravenous Once  . DISCONTD: feeding supplement  30 mL Oral QID  . DISCONTD: feeding supplement (PROMOTE)  1,000 mL Per Tube Q24H    Assessment: 76 y.o female with NSTEMI and hx of afib (on coumadin PTA) with plans to reinitiate heparin s/p trach. Last HL was supratherapeutic on heparin (0.84) at a rate of 1800 unit/hr.   Noted slight and crusted bleeding at trach site but no evidence of continued or new bleeding.  Goal of Therapy:  Heparin level 0.3-0.7  units/ml Monitor platelets by anticoagulation protocol: Yes   Plan:  -Restart heparin rate at 1700 units/hour  -HL at 6 hours after change -Monitor CBC and s/sx of bleeding  Tiney Rouge PharmD candidate 10/05/2011,2:26 PM  I have reviewed th above and agree with the plan noted  Harland German, Pharm D 10/05/2011 4:06 PM

## 2011-10-05 NOTE — Procedures (Signed)
Bedside Tracheostomy Insertion Procedure Note   Patient Details:   Name: Alicia Guerrero DOB: May 19, 1935 MRN: [id number]  Procedure: Tracheostomy  Pre Procedure Assessment: ET Tube Size: ET Tube secured at lip (cm): Bite block in place: Yes Breath Sounds: Diminished  Post Procedure Assessment: BP 147/59  Pulse 61  Temp 99.4 F (37.4 C) (Core (Comment))  Resp 15  Ht 5' 3.5" (1.613 m)  Wt 180 lb 12.4 oz (82 kg)  BMI 31.52 kg/m2  SpO2 95% O2 sats: stable throughout Complications: No apparent complications Patient did tolerate procedure well Tracheostomy Brand:Shiley Tracheostomy Style:Cuffed Tracheostomy Size: 6.0 Tracheostomy Secured [id number], and Velcro ties Tracheostomy Placement Confirmation:Trach cuff visualized and in place cxray ordered   Jennette Kettle 10/05/2011, 11:19 AM

## 2011-10-05 NOTE — Progress Notes (Signed)
Notified Dr. Tyson Alias about blood-tinged secretions from trach, and minimal bleeding around trach site. Orders given to hold heparin drip overnight and reassess in the morning.

## 2011-10-05 NOTE — Progress Notes (Signed)
Nutrition Follow-up  Intervention:   1. Decrease Promote to goal rate of 20 ml/hr 2. Increase pro-stat to 5 times daily  This will provide 980 kcal (68%), 105 gm protein, and 403 ml free water.  3. RD will continue to follow     Assessment:   Failed wean, was not able to extubate this morning. Bedside trach this morning.   TF: Promote @ 30 ml/hr  Supplements: 30 ml Pro-stat 4 times daily   Provides 1120 kcal, 105 gm protein and 604 ml free water daily.  Residuals: none Last BM: 9/15  Propofol: none   MV: 6.7  Temp:Temp (24hrs), Avg:98.9 F (37.2 C), Min:98.2 F (36.8 C), Max:99.4 F (37.4 C)  Diet Order:  NPO  Meds: Scheduled Meds:    . albuterol  2.5 mg Nebulization Once  . antiseptic oral rinse  15 mL Mouth Rinse Q4H  . aspirin  325 mg Per Tube Daily  . chlorhexidine  15 mL Mouth/Throat BID  . etomidate      . etomidate  40 mg Intravenous Once  . feeding supplement  30 mL Oral QID  . feeding supplement (PROMOTE)  1,000 mL Per Tube Q24H  . fentaNYL  200 mcg Intravenous Once  . free water  200 mL Per Tube Q6H  . furosemide      . furosemide  20 mg Intravenous Q12H  . furosemide  40 mg Intravenous Q12H  . insulin aspart  0-20 Units Subcutaneous Q4H  . insulin glargine  15 Units Subcutaneous QHS  . levofloxacin (LEVAQUIN) IV  750 mg Intravenous Q24H  . lidocaine (cardiac) 100 mg/94ml      . methylPREDNISolone (SOLU-MEDROL) injection  40 mg Intravenous Q12H  . metoprolol tartrate  12.5 mg Oral BID  . midazolam  5 mg Intravenous Once  . multivitamin  5 mL Oral Daily  . pantoprazole (PROTONIX) IV  40 mg Intravenous Q24H  . potassium chloride  10 mEq Intravenous Q1 Hr x 2  . potassium chloride  40 mEq Per Tube Once  . propofol  5-70 mcg/kg/min Intravenous Once  . Racepinephrine HCl  0.5 mL Nebulization Once  . rocuronium      . succinylcholine      . vecuronium  10 mg Intravenous Once   Continuous Infusions:    . sodium chloride 15 mL/hr (10/05/11 0427)  .  sodium chloride 20 mL/hr (10/05/11 0428)  . fentaNYL infusion INTRAVENOUS 200 mcg/hr (10/04/11 2000)  . heparin Stopped (10/05/11 0600)   PRN Meds:.fentaNYL, LORazepam  Labs:  CMP     Component Value Date/Time   NA 142 10/05/2011 0400   K 4.1 10/05/2011 0400   CL 103 10/05/2011 0400   CO2 32 10/05/2011 0400   GLUCOSE 193* 10/05/2011 0400   BUN 25* 10/05/2011 0400   CREATININE 0.58 10/05/2011 0400   CALCIUM 8.8 10/05/2011 0400   PROT 5.5* 10/04/2011 0425   ALBUMIN 2.2* 10/04/2011 0425   AST 20 10/04/2011 0425   ALT 36* 10/04/2011 0425   ALKPHOS 84 10/04/2011 0425   BILITOT 0.3 10/04/2011 0425   GFRNONAA 89* 10/05/2011 0400   GFRAA >90 10/05/2011 0400     Intake/Output Summary (Last 24 hours) at 10/05/11 1011 Last data filed at 10/05/11 0800  Gross per 24 hour  Intake   2834 ml  Output   6830 ml  Net  -3996 ml    Weight Status:  180 lbs, down with some fluids out  Re-estimated needs:  1446 kcal (underfeeding goal [phone]  kcal) and 107-120 gm protein   Nutrition Dx:  Inadequate oral intake r/t inability to eat AEB mechanically ventilated   Goal:  Enteral nutrition to provide 60-70% of estimated calorie needs (22-25 kcals/kg ideal body weight) and >/= 90% of estimated protein needs, based on ASPEN guidelines for permissive underfeeding in critically ill obese individuals. Unmet at this time  Monitor:  Vent status, TF tolerance, weight, labs   Clarene Duke RD, LDN Pager [phone] After Hours pager [phone]

## 2011-10-05 NOTE — Procedures (Signed)
Bronchoscopy Procedure Note Alicia Guerrero [id number] Nov 27, 1935  Procedure: Bronchoscopy Indications: Diagnostic evaluation of the airways and to facilitate tracheostomy placement. please refer also to Procedure note for percutaneous Tracheostomy by Dr Tyson Alias  Procedure Details Consent: Risks of procedure as well as the alternatives and risks of each were explained to the (patient/caregiver).  Consent for procedure obtained. Time Out: Verified patient identification, verified procedure, site/side was marked, verified correct patient position, special equipment/implants available, medications/allergies/relevent history reviewed, required imaging and test results available.  Performed  In preparation for procedure, patient was given 100% FiO2 and bronchoscope lubricated. Sedation: Benzodiazepines and Propofol  Airway entered and the following bronchi were examined: RUL, RML, RLL, LUL and LLL.   Procedures performed: LLL BAL from the superior segment, 25cc NS instilled and approximately 3cc returned, to be sent for cultures.  Bronchoscope removed.    Evaluation Hemodynamic Status: BP stable throughout; O2 sats: stable throughout Patient's Current Condition: stable Specimens:  Sent for Micro Complications: No apparent complications Patient did tolerate procedure well.   Alicia Guerrero S. 10/05/2011

## 2011-10-05 NOTE — Progress Notes (Signed)
Name: Alicia Guerrero MRN: [id number] DOB: 05/05/1935    LOS: 9 Date of admit : 09/26/2011 PCP is Illene Regulus, MD   Referring Provider:  [facility] Reason for Referral:  Cardiac arrest  PULMONARY / CRITICAL CARE MEDICINE  Patient summary:  76 yo with extensive cardiac history brought to [facility] ED after VT/VF arrest.  Transferred to [facility] for hypothermia protocol.  Events Since Admission: 9/10  VT/VF arrest, intubated, hypothermia initiated 9/11  Hypothermia stopped as multiple arrhythmias and coagulopathy  SUBJECTIVE/OVERNIGHT/INTERVAL HX No cuff leak 9/18 so no extubation Tolerated PSV this am   Vital Signs: Temp:  [98.2 F (36.8 C)-99.4 F (37.4 C)] 99.4 F (37.4 C) (09/19 0755) Pulse Rate:  [49-74] 54  (09/19 0800) Resp:  [9-29] 16  (09/19 0800) BP: (117-166)/(46-71) 143/59 mmHg (09/19 0800) SpO2:  [93 %-97 %] 95 % (09/19 0800) FiO2 (%):  [40 %] 40 % (09/19 0800) Weight:  [82 kg (180 lb 12.4 oz)-85 kg (187 lb 6.3 oz)] 82 kg (180 lb 12.4 oz) (09/19 0500)  Intake/Output Summary (Last 24 hours) at 10/05/11 0857 Last data filed at 10/05/11 0800  Gross per 24 hour  Intake   3105 ml  Output   7705 ml  Net  -4600 ml    Physical Examination: General:  Intubated, mechanically ventilated Neuro:  Follows commands RASS 0. Awake and tracks,  HEENT: PERRL Neck:  No JVD Cardiovascular:  RRR, no murmurs Lungs:  Bilateral air entry, few rales Abdomen:  Soft, bowel sounds present Musculoskeletal:  No edema Skin:  Intact.  Principal Problem:  *Cardiac arrest - ventricular fibrillation Active Problems:  HYPERTENSION  CORONARY ARTERY DISEASE  Acute respiratory failure  Altered mental status  Acute non-ST segment elevation myocardial infarction  Hypokalemia  Sinus bradycardia  Hyperosmolality and/or hypernatremia  PAF (paroxysmal atrial fibrillation)  Dg Chest Port 1 View  10/05/2011  *RADIOLOGY REPORT*  Clinical Data: Evaluate pulmonary infiltrates and endotracheal tube  PORTABLE  CHEST - 1 VIEW  Comparison: 10/04/2011; 10/02/2011; 10/01/2011  Findings: Grossly unchanged enlarged cardiac silhouette and mediastinal contours with atherosclerotic calcifications within the aortic arch.  Stable position of support apparatus.  Grossly unchanged small bilateral pleural effusions and bibasilar heterogeneous opacities, left greater than right.  The pulmonary vasculature remains indistinct with cephalization of flow.  Grossly unchanged bones.  IMPRESSION: 1.  Stable positioning of support apparatus.  No pneumothorax. 2.  Grossly unchanged findings of pulmonary edema, small bilateral effusions and bibasilar opacities, left greater than right, atelectasis versus infiltrate.   Original Report Authenticated By: Waynard Reeds, M.D.    Dg Chest Port 1 View  10/04/2011  *RADIOLOGY REPORT*  Clinical Data: Evaluate endotracheal tube and central venous lines  PORTABLE CHEST - 1 VIEW  Comparison: Portable chest x-ray of 10/02/2011  Findings: The tip of the endotracheal tube is still approximately 1.3 cm above the carina.  There is now more diffuse airspace disease most consistent with edema and bilateral pleural effusions with cardiomegaly.  A left central venous line remains with the tip overlying the mid SVC.  No pneumothorax is noted.  IMPRESSION:  1.  Tip of endotracheal tube only 1.3 cm above carina. 2.  Worsening of apparent CHF with congestion, cardiomegaly, and effusions.   Original Report Authenticated By: Juline Patch, M.D.      ASSESSMENT AND PLAN  PULMONARY No results found for this basename: PHART:5,PCO2:5,PCO2ART:5,PO2ART:5,HCO3:5,O2SAT:5 in the last 168 hours Ventilator Settings: Vent Mode:  [-] PRVC FiO2 (%):  [40 %] 40 %  Set Rate:  [14 bmp] 14 bmp Vt Set:  [500 mL] 500 mL PEEP:  [5 cmH20] 5 cmH20 Pressure Support:  [10 cmH20] 10 cmH20 Plateau Pressure:  [13 cmH20-14 cmH20] 14 cmH20  CXR:  9/18 >> progressive B edema pattern, small B effusions.  ETT:  9/10>>> 9/16 ETT:  9/16 (reintubated for stridor) >>   A:  Acute respiratory failure post cardiac arrest. CT 9/13 - neg for PE. Progression to extubation initially limited by MS and agitation, failed extubation 9/16 due to stridor P:   - tolerating PSV, but no cuff leak. For tracheostomy today (likely short-term) to allow UA improvement, MS improvement.  - repeat empiric diuresis today for apparent pulm edema (-4.6L 9/18) ;  also consider ALI or infxn (on Levaquin) - continue steroids for now, consider d/c 9/19  CARDIOVASCULAR  Lab 10/04/11 0425 10/02/11 0300 10/01/11 0500 10/01/11 0423  TROPONINI -- -- -- --  LATICACIDVEN -- -- 1.6 --  PROBNP 5880.0* 1114.0* -- 1056.0*   TTE:  9/10 >>> EF 45%, severe hypokinesis of inferior myocardium consistent with MI. ECG:  9/11 >>> LBBB Lines: L IJ TLC 9/10 >>  A: VT/VF arrest. NSTEMI.  Arrhythmias.  Shock, likely cardiogenic, resolved, off pressors. P:  Heparin gtt per Cardiology >> hold for trach today Lasix 9/18 and 19 for apparent pulm edema CXR, rising BNP low dose beta blocker  Hold ACE for now ASA ? Start statin soon as LFT's have normalized, will defer to cards elective cath before discharge   RENAL  Lab 10/05/11 0400 10/04/11 1807 10/04/11 0425 10/03/11 0428 10/02/11 0300 10/01/11 0423  NA 142 142 143 148* 149* --  K 4.1 3.6 -- -- -- --  CL 103 102 107 115* 120* --  CO2 32 31 28 24 22  --  BUN 25* 22 21 22  25* --  CREATININE 0.58 0.51 0.52 0.60 0.62 --  CALCIUM 8.8 8.8 8.6 8.8 8.5 --  MG -- -- 2.0 -- 2.4 2.5  PHOS -- -- 4.0 -- 2.8 2.0*   Intake/Output      09/18 0701 - 09/19 0700 09/19 0701 - 09/20 0700   P.O. 1    I.V. (mL/kg) 1654 (20.2) 45 (0.5)   NG/GT 1440    IV Piggyback 58    Total Intake(mL/kg) 3153 (38.5) 45 (0.5)   Urine (mL/kg/hr) 7750 (3.9) 80   Total Output 7750 80   Net -4597 -35         Foley:  9/10 >>>  A: Normal renal function.  Hypokalemia and hypophos Hypernatremia P:   Trend BMP and mag and phos Hyponatremia  improved with free water  GASTROINTESTINAL  Lab 10/04/11 0425  AST 20  ALT 36*  ALKPHOS 84  BILITOT 0.3  PROT 5.5*  ALBUMIN 2.2*   A:  Shocked liver, improved transaminases. P:   TF, assess for PMV and PO once trach placed  HEMATOLOGIC  Lab 10/05/11 0400 10/04/11 1610 10/04/11 0425 10/03/11 0428 10/02/11 0300 10/01/11 0423 09/30/11 0935 09/29/11 0925 09/28/11 1049  HGB 10.2* -- 10.0* 9.4* 9.4* 10.5* -- -- --  HCT 32.2* -- 31.6* 29.5* 29.3* 32.5* -- -- --  PLT 381 -- 332 274 208 172 -- -- --  INR -- 1.51* -- -- -- 1.77* 2.42* 3.51* 1.26  APTT -- -- -- -- -- -- -- -- --   A:  Coagulopathy (Coumadin induced, exacerbated by hypothermia) - resolved. Anemia, no overt hemorrhage. P: Trend CBC PRBC for hgg < 8gm% due to  MI Holding Coumadin  INFECTIOUS  Lab 10/05/11 0400 10/04/11 0425 10/03/11 0428 10/02/11 0300 10/01/11 0423  WBC 19.4* 19.9* 14.4* 10.8* 10.0  PROCALCITON -- -- -- -- 1.66   Cultures: 9/12  Blood >>> ntd >>  9/12  Urine >>> E.Coli 9/12  Respiratory >>> E coli 9/12  C.diff PCR >>> neg  Antibiotics: 9/12  Levaquin (empiric, E coli) >>> 9/12  Flagyl (empirical, anaerobes, C.diff) >>> 9/16  A:  UTI.  ? Legionella pneumonia (antigen negative); E coli in sputum P: - continue levaquin, day 8 of 10 - d/c flagyl 9/16  ENDOCRINE  Lab 10/05/11 0749 10/05/11 0354 10/04/11 2327 10/04/11 1931 10/04/11 1620  GLUCAP 133* 186* 195* 187* 225*   A:  Hyperglycemia.  Presumed relative adrenal insufficiency. Hydrocort stopped 09/30/11 P:   SSI Lantus  NEUROLOGIC A:  Acute encephalopathy, improving.  Significant anxiety component. P:   precedex changed to ativan prn + fentanyl gtt (due to wide complex brady)  BEST PRACTICES / DISPO ICU status Full code TF Protonix Heparin gtt Skin intact Family - discussed 9/18   The patient is critically ill with multiple organ systems failure and requires high complexity decision making for assessment and support,  frequent evaluation and titration of therapies, application of advanced monitoring technologies and extensive interpretation of multiple databases. Critical Care Time devoted to patient care services described in this note is 35 minutes.   Levy Pupa, MD, PhD 10/05/2011, 8:57 AM  Pulmonary and Critical Care [phone] or if no answer [phone]

## 2011-10-05 NOTE — Procedures (Signed)
Perc trach See full dictation Tolerated well Heparin held pre  Blood loss less 5 cc Size 6 placed  Mcarthur Rossetti. Tyson Alias, MD, FACP Pgr: [phone] [place] [facility]

## 2011-10-05 NOTE — Progress Notes (Signed)
Levy Pupa, MD, PhD 10/05/2011, 11:51 AM [place] [facility] [phone] or if no answer [phone]

## 2011-10-06 ENCOUNTER — Inpatient Hospital Stay (HOSPITAL_COMMUNITY): Payer: Medicare Other

## 2011-10-06 LAB — BASIC METABOLIC PANEL
BUN: 26 mg/dL — ABNORMAL HIGH (ref 6–23)
GFR calc non Af Amer: >90 mL/min (ref >90)
Glucose, Bld: 189 mg/dL — ABNORMAL HIGH (ref 70–99)
Potassium: 3.1 mEq/L — ABNORMAL LOW (ref 3.5–5.1)

## 2011-10-06 LAB — CBC
Hemoglobin: 11 g/dL — ABNORMAL LOW (ref 12.0–15.0)
MCH: 26.2 pg (ref 26.0–34.0)
MCHC: 32.3 g/dL (ref 30.0–36.0)

## 2011-10-06 LAB — GLUCOSE, CAPILLARY: Glucose-Capillary: 143 mg/dL — ABNORMAL HIGH (ref 70–99)

## 2011-10-06 MED ORDER — FUROSEMIDE 10 MG/ML IJ SOLN
20.0000 mg | Freq: Every day | INTRAMUSCULAR | Status: DC
  Administered 2011-10-06 – 2011-10-09 (×4): 20 mg via INTRAVENOUS
  Filled 2011-10-06 (×4): qty 2

## 2011-10-06 MED ORDER — POTASSIUM CHLORIDE 20 MEQ/15ML (10%) PO LIQD: 40.0000 meq | Freq: Once | ORAL | Status: DC

## 2011-10-06 MED ORDER — FUROSEMIDE 10 MG/ML IJ SOLN
INTRAMUSCULAR | Status: AC
  Administered 2011-10-06: 20 mg via INTRAVENOUS
  Filled 2011-10-06: qty 4

## 2011-10-06 MED ORDER — POTASSIUM CHLORIDE 20 MEQ/15ML (10%) PO LIQD
40.0000 meq | ORAL | Status: AC
  Administered 2011-10-06 (×3): 40 meq
  Filled 2011-10-06 (×3): qty 30

## 2011-10-06 MED ORDER — POTASSIUM CHLORIDE 20 MEQ/15ML (10%) PO LIQD
ORAL | Status: AC
  Filled 2011-10-06: qty 30

## 2011-10-06 MED ORDER — POTASSIUM CHLORIDE 20 MEQ/15ML (10%) PO LIQD
ORAL | Status: AC
  Administered 2011-10-06: 40 meq
  Filled 2011-10-06: qty 15

## 2011-10-06 NOTE — Progress Notes (Signed)
Nursing: Patient found with legs over right side rails 3 separate times. Multiple attempts to orient patient unsuccessful. Spoke with [facility] nurse, regarding the need for a low bed due to her high fall risk. Patient placed on low bed at 2100.Padded floor mats placed on either side and bed placed in lowest position. Bed exit alarm activated.  At 2155 went to reassess patient and found rt nare nasogastric tube had been removed by patient. Patient was very restless and vital signs unchanged. RT John placed patient on ventilator. Patient still agitated and attempting to get OOB. Ativan IV given as ordered. Prior to replacing the NGT, discussed with patient the need for the feeding tube and how the procedure went. Patient mouthed the words"ok". 12 fr NGT placed without difficult in right nare. Ausculted air over right upper quadrant and was also heard by the RT John to verify placement. Tube feedings resumed. NGT taped to nose. Patient was reoriented to place,date,time and purpose again. Also discussed the need to leave feeding tube in and not to remove it. Patient mouthed the words "ok".

## 2011-10-06 NOTE — Evaluation (Signed)
[facility] Speaking Valve - Evaluation Patient Details  Name: Alicia Guerrero MRN: [id number] Date of Birth: 1935-10-01  Today's Date: 10/06/2011 Time: 0865-7846 SLP Time Calculation (min): 31 min  Past Medical History:  Past Medical History  Diagnosis Date  . Left bundle branch block   . Atrial fibrillation   . Acute myocardial infarction, unspecified site, episode of care unspecified   . Coronary atherosclerosis   . Pure hypercholesterolemia   . Unspecified essential hypertension   . Cough   . Other atopic dermatitis and related conditions   . Acute upper respiratory infections of unspecified site   . Allergic rhinitis due to pollen   . Unspecified asthma   . Other and unspecified ovarian cyst   . Personal history of malignant neoplasm of breast   . Personal history of hyperthyroidism   . Chronic eczema     hands  . Hx of coronary angioplasty     percutaneous transluminal  . Hemorrhoids   . Cancer    Past Surgical History:  Past Surgical History  Procedure Date  . Oophorectomy   . Abdominal hysterectomy   . Ptca   . Cholecystectomy   . Coronary stent placement may and  july 2012  . Breast lumpectomy 08/24/2003    right lumpectomy+sln,T2N0,ERPR+,Her2-  . Polypectomy   . Colonoscopy    HPI:  76 year old female with extensive cardiac history who presents to the [facility] ED with VT arrest in the [facility], patient required multiple shocks and amiodarone.  Unfortunately exact down time is unknown since it was a combination of field and ED downtime.  Hypothermia protocol. Pt was intubated in the field on 09/26/11. Upon extubation 9/19 pt developed stridor, tracheostomy done. Pt now on ATC.    Assessment / Plan / Recommendation Clinical Impression  Pt demonstrated adequate tolerance of PMSV with redirection of air to upper airway with phonation, stable vital signs and no evidence of CO2 trapping over a 30 minute time period during which pt was rolling for toileting. Breath support for  inteeligible speech was limited likely due to pts lethargy, poor positioning and presence of deflated cuff decreased complete volume of airflwo. However, feel pt is safe to wear PMSV with full staff supervision or with family member present to ensure pt stays awake. SLP will f/u for tolerance and treatment for increased intelligibility. Pt will need swallow eval, but does not demonstrate ability to sustain alertness for PO intake. Would not yet recommend removal of NG for evaluation until alertness improves.    SLP Assessment  Patient needs continued Speech Lanaguage Pathology Services    Follow Up Recommendations  Inpatient Rehab    Frequency and Duration min 2x/week  2 weeks   Pertinent Vitals/Pain NA    SLP Goals SLP Goal #1: pt will tolerate placement of valve during all waking hours with intermittent supervision SLP Goal #2: Pt will phonate at conversation level with adequate intelligiblity with moderate verbal cues.  SLP Goal #3: Pt will demosntrate placement and removal of PMSV and verbalize prequations with min cues.    PMSV Trial  PMSV was placed for: 30 minutes Able to redirect subglottic air through upper airway: Yes Able to Attain Phonation: Yes Voice Quality: Low vocal intensity Able to Expectorate Secretions: Yes Level of Secretion Expectoration with PMSV: Oral Breath Support for Phonation: Mildly decreased Intelligibility: Intelligibility reduced Word: 25-49% accurate Phrase: 25-49% accurate Sentence: 0-24% accurate Conversation: 0-24% accurate Respirations During Trial: 22  SpO2 During Trial: 100 % Behavior: Other (  comment) (lethargic)   Tracheostomy Tube  Additional Tracheostomy Tube Assessment Trach Collar Period: 12 hours Secretion Description: reddish, thin Frequency of Tracheal Suctioning: rare, pt coughing up to trach. Done right after cuff deflated Level of Secretion Expectoration: Tracheal    Vent Dependency  Vent Dependent: No FiO2 (%): 35 %      Cuff Deflation Trial Tolerated Cuff Deflation: Yes Length of Time for Cuff Deflation Trial: 30 min Behavior: Cooperative (lethargic)   Alicia Guerrero, [name] 10/06/2011, 12:34 PM

## 2011-10-06 NOTE — Progress Notes (Addendum)
Name: Alicia Guerrero MRN: [id number] DOB: [birth year]    LOS: 10 Date of admit : 09/26/2011 PCP is Illene Regulus, MD   Referring Provider:  [facility] Reason for Referral:  Cardiac arrest  PULMONARY / CRITICAL CARE MEDICINE  Patient summary:  76 yo with extensive cardiac history brought to [facility] ED after VT/VF arrest.  Transferred to [facility] for hypothermia protocol.  Events Since Admission: 9/10  VT/VF arrest, intubated, hypothermia initiated 9/11  Hypothermia stopped as multiple arrhythmias and coagulopathy 9/19 s/p tracheostomy (DF)  SUBJECTIVE/OVERNIGHT/INTERVAL HX s/p tracheostomy 9/19, well tolerated  Vital Signs: Temp:  [98.1 F (36.7 C)-99.1 F (37.3 C)] 98.1 F (36.7 C) (09/20 0753) Pulse Rate:  [45-70] 64  (09/20 0800) Resp:  [14-21] 19  (09/20 0800) BP: (95-163)/(43-86) 150/67 mmHg (09/20 0800) SpO2:  [89 %-100 %] 100 % (09/20 0800) FiO2 (%):  [40 %-100 %] 40 % (09/20 0753) Weight:  [77.9 kg (171 lb 11.8 oz)] 77.9 kg (171 lb 11.8 oz) (09/20 0440)  Intake/Output Summary (Last 24 hours) at 10/06/11 0955 Last data filed at 10/06/11 0800  Gross per 24 hour  Intake   1720 ml  Output   3730 ml  Net  -2010 ml    Physical Examination: General:  Ill appearing obese woman, mechanically ventilated Neuro:  Follows commands RASS 0. Awake and tracks,  HEENT: PERRL Neck:  No JVD Cardiovascular:  RRR, no murmurs Lungs:  Bilateral air entry, few rales Abdomen:  Soft, bowel sounds present Musculoskeletal:  No edema Skin:  Intact.  Principal Problem:  *Cardiac arrest - ventricular fibrillation Active Problems:  HYPERTENSION  CORONARY ARTERY DISEASE  Acute respiratory failure  Altered mental status  Acute non-ST segment elevation myocardial infarction  Hypokalemia  Sinus bradycardia  Hyperosmolality and/or hypernatremia  PAF (paroxysmal atrial fibrillation)  Dg Chest Port 1 View  10/06/2011  *RADIOLOGY REPORT*  Clinical Data: Tracheostomy.  PORTABLE CHEST - 1 VIEW   Comparison: 10/05/2011.  Findings: Tracheostomy is unchanged.  Left IJ central line remains present.  Interval introduction of a nasogastric tube with the tip not visualized.  Shifting pleural effusions are present with bilateral basilar atelectasis.  Pleural effusions appear small. Aortic atherosclerosis.  Patient rotated to the left. Monitoring leads are projected over the chest.  Cardiomediastinal contours are unchanged.  IMPRESSION:  1.  The tracheostomy and left IJ central line are unchanged. 2.  Interval introduction of nasogastric tube.  Tip not visualized. 3.  Basilar atelectasis and shifting pleural effusions.   Original Report Authenticated By: Andreas Newport, M.D.    Chest Portable 1 View To Assess Tube Placement And Rule-out Pneumothorax  10/05/2011  *RADIOLOGY REPORT*  Clinical Data: Interval tracheostomy tube placement.  PORTABLE CHEST - 1 VIEW  Comparison: 10/05/2011  Findings: Interval tracheostomy with tip projecting over the thoracic inlet.  Left IJ central venous catheter tip projects over the proximal SVC.  Degraded by patient body habitus and portable technique.  No definite pneumothorax.  Hazy appearance to the hemidiaphragms suggests layering pleural fluid. Associated opacities, likely atelectasis.  Heart size enlarged.  Central vascular congestion.  No acute osseous finding.  IMPRESSION: Interval tracheostomy tube placement with tip projecting over the thoracic inlet.  No definite pneumothorax.  Layering pleural effusions and bibasilar opacities.   Original Report Authenticated By: Waneta Martins, M.D.    Dg Chest Port 1 View  10/05/2011  *RADIOLOGY REPORT*  Clinical Data: Evaluate pulmonary infiltrates and endotracheal tube  PORTABLE CHEST - 1 VIEW  Comparison: 10/04/2011; 10/02/2011; 10/01/2011  Findings: Grossly unchanged enlarged cardiac silhouette and mediastinal contours with atherosclerotic calcifications within the aortic arch.  Stable position of support apparatus.  Grossly  unchanged small bilateral pleural effusions and bibasilar heterogeneous opacities, left greater than right.  The pulmonary vasculature remains indistinct with cephalization of flow.  Grossly unchanged bones.  IMPRESSION: 1.  Stable positioning of support apparatus.  No pneumothorax. 2.  Grossly unchanged findings of pulmonary edema, small bilateral effusions and bibasilar opacities, left greater than right, atelectasis versus infiltrate.   Original Report Authenticated By: Waynard Reeds, M.D.      ASSESSMENT AND PLAN  PULMONARY No results found for this basename: PHART:5,PCO2:5,PCO2ART:5,PO2ART:5,HCO3:5,O2SAT:5 in the last 168 hours Ventilator Settings: Vent Mode:  [-] PRVC FiO2 (%):  [40 %-100 %] 40 % Set Rate:  [14 bmp] 14 bmp Vt Set:  [500 mL] 500 mL PEEP:  [5 cmH20] 5 cmH20 Pressure Support:  [10 cmH20] 10 cmH20 Plateau Pressure:  [15 cmH20-21 cmH20] 16 cmH20  CXR:  9/18 >> progressive B edema pattern, small B effusions.  ETT:  9/10>>> 9/16 ETT: 9/16 (reintubated for stridor) >> 9/19 Trach 9/19 (DF) >>   A:  Acute respiratory failure post cardiac arrest. CT 9/13 - neg for PE. Progression to extubation initially limited by MS and agitation, failed extubation 9/16 due to stridor P:   - minimize any sedation and push weaning, PSV and hopefully ATC - Has been diuresed -4L over last 48 hours for possible component cardiogenic edema. Also consider ALI or infxn (on Levaquin) - d/c solumedrol 9/20  CARDIOVASCULAR  Lab 10/04/11 0425 10/02/11 0300 10/01/11 0500 10/01/11 0423  TROPONINI -- -- -- --  LATICACIDVEN -- -- 1.6 --  PROBNP 5880.0* 1114.0* -- 1056.0*   TTE:  9/10 >>> EF 45%, severe hypokinesis of inferior myocardium consistent with MI. ECG:  9/11 >>> LBBB Lines: L IJ TLC 9/10 >>  A: VT/VF arrest. NSTEMI.  Arrhythmias, Atrial fib.  Shock, likely cardiogenic, resolved, off pressors. P:  Heparin gtt d/c'd 9/19 (for ACS and A Fib), likely restart hep to coumadin after ACS  workup Lasix 9/18 and 19 >> improved effusions and atx; start maintenance lasix 9/20 low dose beta blocker  Hold ACE for now ASA ? Start statin soon as LFT's have normalized, will defer to cards elective cath before discharge   RENAL  Lab 10/06/11 0455 10/05/11 0400 10/04/11 1807 10/04/11 0425 10/03/11 0428 10/02/11 0300 10/01/11 0423  NA 141 142 142 143 148* -- --  K 3.1* 4.1 -- -- -- -- --  CL 100 103 102 107 115* -- --  CO2 32 32 31 28 24  -- --  BUN 26* 25* 22 21 22  -- --  CREATININE 0.55 0.58 0.51 0.52 0.60 -- --  CALCIUM 8.9 8.8 8.8 8.6 8.8 -- --  MG -- -- -- 2.0 -- 2.4 2.5  PHOS -- -- -- 4.0 -- 2.8 2.0*   Intake/Output      09/19 0701 - 09/20 0700 09/20 0701 - 09/21 0700   P.O.     I.V. (mL/kg) 550 (7.1) 20 (0.3)   NG/GT 1210 20   IV Piggyback 10    Total Intake(mL/kg) 1770 (22.7) 40 (0.5)   Urine (mL/kg/hr) 3600 (1.9) 210   Total Output 3600 210   Net -1830 -170         Foley:  9/10 >>>  A: Normal renal function.  Hypokalemia and hypophos Hypernatremia P:   Trend BMP and mag and phos Hyponatremia improved with  free water  GASTROINTESTINAL  Lab 10/04/11 0425  AST 20  ALT 36*  ALKPHOS 84  BILITOT 0.3  PROT 5.5*  ALBUMIN 2.2*   A:  Shocked liver, improved transaminases. P:   TF, assess for PMV and PO once trach placed  HEMATOLOGIC  Lab 10/06/11 0455 10/05/11 0400 10/04/11 1610 10/04/11 0425 10/03/11 0428 10/02/11 0300 10/01/11 0423 09/30/11 0935  HGB 11.0* 10.2* -- 10.0* 9.4* 9.4* -- --  HCT 34.1* 32.2* -- 31.6* 29.5* 29.3* -- --  PLT 397 381 -- 332 274 208 -- --  INR -- -- 1.51* -- -- -- 1.77* 2.42*  APTT -- -- -- -- -- -- -- --   A:  Coagulopathy (Coumadin induced, exacerbated by hypothermia) - resolved. Anemia, no overt hemorrhage. P: Trend CBC PRBC for hgg < 8gm% due to MI Holding Coumadin  INFECTIOUS  Lab 10/06/11 0455 10/05/11 0400 10/04/11 0425 10/03/11 0428 10/02/11 0300 10/01/11 0423  WBC 21.2* 19.4* 19.9* 14.4* 10.8* --    PROCALCITON -- -- -- -- -- 1.66   Cultures: 9/12  Blood >>> negative 9/12  Urine >>> E.Coli 9/12  Respiratory >>> E coli 9/12  C.diff PCR >>> neg 9/19  LLL BAL >>   Antibiotics: 9/12  Levaquin (empiric, E coli) >>> 9/12  Flagyl (empirical, anaerobes, C.diff) >>> 9/16  A:  UTI.  ? Legionella pneumonia (antigen negative); E coli in sputum P: - continue levaquin, day 9 of 10 - d/c flagyl 9/16  ENDOCRINE  Lab 10/06/11 0754 10/06/11 0439 10/05/11 2336 10/05/11 1935 10/05/11 1647  GLUCAP 178* 174* 188* 144* 142*   A:  Hyperglycemia.  Presumed relative adrenal insufficiency. Hydrocort stopped 09/30/11 P:   SSI Lantus  NEUROLOGIC A:  Acute encephalopathy, improving.  Significant anxiety component. P:   - prn sedation  BEST PRACTICES / DISPO ICU status Full code TF Protonix Heparin gtt Skin intact Family - discussed 9/20   The patient is critically ill with multiple organ systems failure and requires high complexity decision making for assessment and support, frequent evaluation and titration of therapies, application of advanced monitoring technologies and extensive interpretation of multiple databases. Critical Care Time devoted to patient care services described in this note is 35 minutes.   Levy Pupa, MD, PhD 10/06/2011, 9:55 AM  Pulmonary and Critical Care [phone] or if no answer [phone]

## 2011-10-07 ENCOUNTER — Inpatient Hospital Stay (HOSPITAL_COMMUNITY): Payer: Medicare Other

## 2011-10-07 LAB — GLUCOSE, CAPILLARY
Glucose-Capillary: 119 mg/dL — ABNORMAL HIGH (ref 70–99)
Glucose-Capillary: 136 mg/dL — ABNORMAL HIGH (ref 70–99)
Glucose-Capillary: 114 mg/dL — ABNORMAL HIGH (ref 70–99)
Glucose-Capillary: 166 mg/dL — ABNORMAL HIGH (ref 70–99)

## 2011-10-07 LAB — CULTURE, BAL-QUANTITATIVE W GRAM STAIN: Colony Count: 50000

## 2011-10-07 LAB — BASIC METABOLIC PANEL
CO2: 30 mEq/L (ref 19–32)
Calcium: 9 mg/dL (ref 8.4–10.5)
Glucose, Bld: 178 mg/dL — ABNORMAL HIGH (ref 70–99)
Sodium: 139 mEq/L (ref 135–145)

## 2011-10-07 LAB — CBC
HCT: 34.9 % — ABNORMAL LOW (ref 36.0–46.0)
Hemoglobin: 11.2 g/dL — ABNORMAL LOW (ref 12.0–15.0)
MCHC: 32.1 g/dL (ref 30.0–36.0)
WBC: 18.3 10*3/uL — ABNORMAL HIGH (ref 4.0–10.5)

## 2011-10-07 LAB — PHOSPHORUS: Phosphorus: 2.8 mg/dL (ref 2.3–4.6)

## 2011-10-07 MED ORDER — FENTANYL CITRATE 0.05 MG/ML IJ SOLN
25.0000 ug | INTRAMUSCULAR | Status: DC | PRN
  Administered 2011-10-08 – 2011-10-10 (×4): 50 ug via INTRAVENOUS
  Filled 2011-10-07 (×4): qty 2

## 2011-10-07 MED ORDER — HEPARIN SODIUM (PORCINE) 5000 UNIT/ML IJ SOLN
5000.0000 [IU] | Freq: Three times a day (TID) | INTRAMUSCULAR | Status: DC
  Administered 2011-10-07 – 2011-10-12 (×15): 5000 [IU] via SUBCUTANEOUS
  Filled 2011-10-07 (×18): qty 1

## 2011-10-07 MED ORDER — LORAZEPAM 2 MG/ML IJ SOLN
2.0000 mg | INTRAMUSCULAR | Status: DC | PRN
  Administered 2011-10-07: 2 mg via INTRAVENOUS
  Filled 2011-10-07: qty 1

## 2011-10-07 MED ORDER — LEVOFLOXACIN IN D5W 750 MG/150ML IV SOLN
750.0000 mg | INTRAVENOUS | Status: AC
  Administered 2011-10-07: 750 mg via INTRAVENOUS
  Filled 2011-10-07: qty 150

## 2011-10-07 MED ORDER — ATORVASTATIN CALCIUM 40 MG PO TABS
40.0000 mg | ORAL_TABLET | Freq: Every day | ORAL | Status: DC
  Administered 2011-10-07 – 2011-10-11 (×4): 40 mg via ORAL
  Filled 2011-10-07 (×7): qty 1

## 2011-10-07 NOTE — Progress Notes (Signed)
Name: Alicia Guerrero MRN: [id number] DOB: 08/28/1935    LOS: 11 Date of admit : 09/26/2011 PCP is Illene Regulus, MD   Referring Provider:  [facility] Reason for Referral:  Cardiac arrest  PULMONARY / CRITICAL CARE MEDICINE  Patient summary:  76 yo with extensive cardiac history brought to [facility] ED after VT/VF arrest.  Transferred to [facility] for hypothermia protocol.  Events Since Admission: 9/10  VT/VF arrest, intubated, hypothermia initiated 9/11  Hypothermia stopped as multiple arrhythmias and coagulopathy 9/19 s/p tracheostomy (DF)  SUBJECTIVE/OVERNIGHT/INTERVAL HX Tolerating ATC, did PRVC last night   Vital Signs: Temp:  [98 F (36.7 C)-100 F (37.8 C)] 100 F (37.8 C) (09/21 0740) Pulse Rate:  [53-72] 62  (09/21 0700) Resp:  [14-26] 20  (09/21 0700) BP: (107-167)/(30-74) 146/60 mmHg (09/21 0700) SpO2:  [93 %-99 %] 94 % (09/21 0700) FiO2 (%):  [30 %-40 %] 35 % (09/21 0400) Weight:  [77.1 kg (169 lb 15.6 oz)] 77.1 kg (169 lb 15.6 oz) (09/21 0500)  Intake/Output Summary (Last 24 hours) at 10/07/11 0814 Last data filed at 10/07/11 0600  Gross per 24 hour  Intake   1970 ml  Output   3510 ml  Net  -1540 ml    Physical Examination: General:  Ill appearing obese woman, mechanically ventilated Neuro:  Follows commands RASS 0. Awake and tracks,  HEENT: PERRL Neck:  No JVD, trach site looks good Cardiovascular:  RRR, no murmurs Lungs:  Bilateral air entry, few rales Abdomen:  Soft, bowel sounds present Musculoskeletal:  No edema Skin:  Intact.  Principal Problem:  *Cardiac arrest - ventricular fibrillation Active Problems:  HYPERTENSION  CORONARY ARTERY DISEASE  Acute respiratory failure  Altered mental status  Acute non-ST segment elevation myocardial infarction  Hypokalemia  Sinus bradycardia  Hyperosmolality and/or hypernatremia  PAF (paroxysmal atrial fibrillation)  Dg Chest Port 1 View  10/07/2011  *RADIOLOGY REPORT*  Clinical Data: Tracheostomy tube, evaluate  pulmonary infiltrates and lines  PORTABLE CHEST - 1 VIEW  Comparison: 10/06/2011; 10/05/2011; 10/02/2011  Findings: Grossly unchanged enlarged cardiac silhouette and mediastinal contours without chronic calcifications in the aortic arch and descending thoracic aorta.  Stable position of support apparatus.  Minimal improved aeration of the lungs with persistent basilar heterogeneous opacities, left greater than right.  Small left-sided pleural effusion is unchanged.  No pneumothorax. Unchanged bones.  IMPRESSION: 1.  Stable positioning of support apparatus.  No pneumothorax. 2.  Improved aeration of the lungs with persistent basilar opacities, left greater than right, likely atelectasis. 3.  Unchanged small left-sided effusion.   Original Report Authenticated By: Waynard Reeds, M.D.    Dg Chest Port 1 View  10/06/2011  *RADIOLOGY REPORT*  Clinical Data: Tracheostomy.  PORTABLE CHEST - 1 VIEW  Comparison: 10/05/2011.  Findings: Tracheostomy is unchanged.  Left IJ central line remains present.  Interval introduction of a nasogastric tube with the tip not visualized.  Shifting pleural effusions are present with bilateral basilar atelectasis.  Pleural effusions appear small. Aortic atherosclerosis.  Patient rotated to the left. Monitoring leads are projected over the chest.  Cardiomediastinal contours are unchanged.  IMPRESSION:  1.  The tracheostomy and left IJ central line are unchanged. 2.  Interval introduction of nasogastric tube.  Tip not visualized. 3.  Basilar atelectasis and shifting pleural effusions.   Original Report Authenticated By: Andreas Newport, M.D.    Chest Portable 1 View To Assess Tube Placement And Rule-out Pneumothorax  10/05/2011  *RADIOLOGY REPORT*  Clinical Data: Interval tracheostomy  tube placement.  PORTABLE CHEST - 1 VIEW  Comparison: 10/05/2011  Findings: Interval tracheostomy with tip projecting over the thoracic inlet.  Left IJ central venous catheter tip projects over the proximal  SVC.  Degraded by patient body habitus and portable technique.  No definite pneumothorax.  Hazy appearance to the hemidiaphragms suggests layering pleural fluid. Associated opacities, likely atelectasis.  Heart size enlarged.  Central vascular congestion.  No acute osseous finding.  IMPRESSION: Interval tracheostomy tube placement with tip projecting over the thoracic inlet.  No definite pneumothorax.  Layering pleural effusions and bibasilar opacities.   Original Report Authenticated By: Waneta Martins, M.D.      ASSESSMENT AND PLAN  PULMONARY No results found for this basename: PHART:5,PCO2:5,PCO2ART:5,PO2ART:5,HCO3:5,O2SAT:5 in the last 168 hours Ventilator Settings: Vent Mode:  [-] PRVC FiO2 (%):  [30 %-40 %] 35 % Set Rate:  [14 bmp] 14 bmp Vt Set:  [500 mL] 500 mL PEEP:  [5 cmH20] 5 cmH20 Plateau Pressure:  [17 cmH20-19 cmH20] 19 cmH20  CXR:  9/18 >> progressive B edema pattern, small B effusions.  ETT:  9/10>>> 9/16 ETT: 9/16 (reintubated for stridor) >> 9/19 Trach 9/19 (DF) >>   A:  Acute respiratory failure post cardiac arrest. CT 9/13 - neg for PE. Progression to extubation initially limited by MS and agitation, failed extubation 9/16 due to stridor P:   - minimize any sedation and push weaning, ATC >> goal 24x7 tonight - Has diuresed 5.5L last 72 hours with improved CXR.  Also consider ALI or infxn (on Levaquin) - d/c'd solumedrol 9/20  CARDIOVASCULAR  Lab 10/04/11 0425 10/02/11 0300 10/01/11 0500 10/01/11 0423  TROPONINI -- -- -- --  LATICACIDVEN -- -- 1.6 --  PROBNP 5880.0* 1114.0* -- 1056.0*   TTE:  9/10 >>> EF 45%, severe hypokinesis of inferior myocardium consistent with MI. ECG:  9/11 >>> LBBB Lines: L IJ TLC 9/10 >>  A: VT/VF arrest. NSTEMI.  Arrhythmias, Atrial fib.  Shock, likely cardiogenic, resolved, off pressors. P:  Heparin gtt d/c'd 9/19 (for ACS and A Fib), likely restart hep to coumadin after ACS workup Lasix 9/18-19 >> improved effusions and  atx; start maintenance lasix 9/20 low dose beta blocker  Hold ACE for now ASA lipitor restarted 9/21 elective cath on Monday 9/23 She may need AICD    RENAL  Lab 10/07/11 0413 10/06/11 0455 10/05/11 0400 10/04/11 1807 10/04/11 0425 10/02/11 0300 10/01/11 0423  NA 139 141 142 142 143 -- --  K 3.5 3.1* -- -- -- -- --  CL 102 100 103 102 107 -- --  CO2 30 32 32 31 28 -- --  BUN 23 26* 25* 22 21 -- --  CREATININE 0.52 0.55 0.58 0.51 0.52 -- --  CALCIUM 9.0 8.9 8.8 8.8 8.6 -- --  MG 2.1 -- -- -- 2.0 2.4 2.5  PHOS 2.8 -- -- -- 4.0 2.8 2.0*   Intake/Output      09/20 0701 - 09/21 0700 09/21 0701 - 09/22 0700   I.V. (mL/kg) 460 (6)    NG/GT 1400    IV Piggyback 150    Total Intake(mL/kg) 2010 (26.1)    Urine (mL/kg/hr) 3720 (2)    Total Output 3720    Net -1710          Foley:  9/10 >>>  A: Normal renal function.  Hypokalemia and hypophos Hypernatremia P:   Trend BMP and mag and phos Hyponatremia improved with free water  GASTROINTESTINAL  Lab 10/04/11 0425  AST 20  ALT 36*  ALKPHOS 84  BILITOT 0.3  PROT 5.5*  ALBUMIN 2.2*   A:  Shocked liver, improved transaminases. P:   TF, assess for PMV and PO once trach placed  HEMATOLOGIC  Lab 10/07/11 0413 10/06/11 0455 10/05/11 0400 10/04/11 1610 10/04/11 0425 10/03/11 0428 10/01/11 0423 09/30/11 0935  HGB 11.2* 11.0* 10.2* -- 10.0* 9.4* -- --  HCT 34.9* 34.1* 32.2* -- 31.6* 29.5* -- --  PLT 411* 397 381 -- 332 274 -- --  INR -- -- -- 1.51* -- -- 1.77* 2.42*  APTT -- -- -- -- -- -- -- --   A:  Coagulopathy (Coumadin induced, exacerbated by hypothermia) - resolved. Anemia, no overt hemorrhage. P: Trend CBC PRBC for hgg < 8gm% due to MI Holding Coumadin  INFECTIOUS  Lab 10/07/11 0413 10/06/11 0455 10/05/11 0400 10/04/11 0425 10/03/11 0428 10/01/11 0423  WBC 18.3* 21.2* 19.4* 19.9* 14.4* --  PROCALCITON -- -- -- -- -- 1.66   Cultures: 9/12  Blood >>> negative 9/12  Urine >>> E.Coli 9/12  Respiratory >>> E  coli 9/12  C.diff PCR >>> neg 9/19  LLL BAL >>   Antibiotics: 9/12  Levaquin (empiric, E coli) >>> 9/21 9/12  Flagyl (empirical, anaerobes, C.diff) >>> 9/16  A:  UTI.  ? Legionella pneumonia (antigen negative); E coli in sputum P: - finish levaquin, day 10 of 10 - d/c flagyl 9/16  ENDOCRINE  Lab 10/07/11 0327 10/06/11 2309 10/06/11 1919 10/06/11 1645 10/06/11 1155  GLUCAP 184* 119* 143* 172* 127*   A:  Hyperglycemia.  Presumed relative adrenal insufficiency. Hydrocort stopped 09/30/11 P:   SSI Lantus  NEUROLOGIC A:  Acute encephalopathy, improving.  Significant anxiety component. P:   - prn sedation  BEST PRACTICES / DISPO ICU status Full code TF >> assess for PO's Protonix Heparin subcut Skin intact Family - discussed 9/21   Levy Pupa, MD, PhD 10/07/2011, 8:14 AM [facility] [phone] or if no answer [phone]

## 2011-10-07 NOTE — Progress Notes (Signed)
SLP Cancellation Note  BSE deferred to 10/08/11.   [name] MS, CCC-SLP [phone]  [facility] 10/07/2011, 4:33 PM

## 2011-10-07 NOTE — Progress Notes (Signed)
SUBJECTIVE:  Awake and alert.  She denies chest pain or SOB.   PHYSICAL EXAM Filed Vitals:   10/07/11 0525 10/07/11 0600 10/07/11 0700 10/07/11 0740  BP:  132/52 146/60   Pulse: 69 65 62   Temp:    100 F (37.8 C)  TempSrc:    Oral  Resp: 22 23 20    Height:      Weight:      SpO2: 99% 93% 94%    General:  No distress Lungs:  Decreased breath sounds Heart:  RRR Abdomen:  Positive bowel sounds, no rebound no guarding Extremities:  Mild edema.  LABS: Lab Results  Component Value Date   CKTOTAL 1385* 09/27/2011   CKMB 60.6* 09/27/2011   TROPONINI 5.77* 09/27/2011   Results for orders placed during the hospital encounter of 09/26/11 (from the past 24 hour(s))  GLUCOSE, CAPILLARY     Status: Abnormal   Collection Time   10/06/11 11:55 AM      Component Value Range   Glucose-Capillary 127 (*) 70 - 99 mg/dL  GLUCOSE, CAPILLARY     Status: Abnormal   Collection Time   10/06/11  4:45 PM      Component Value Range   Glucose-Capillary 172 (*) 70 - 99 mg/dL  GLUCOSE, CAPILLARY     Status: Abnormal   Collection Time   10/06/11  7:19 PM      Component Value Range   Glucose-Capillary 143 (*) 70 - 99 mg/dL  GLUCOSE, CAPILLARY     Status: Abnormal   Collection Time   10/06/11 11:09 PM      Component Value Range   Glucose-Capillary 119 (*) 70 - 99 mg/dL  GLUCOSE, CAPILLARY     Status: Abnormal   Collection Time   10/07/11  3:27 AM      Component Value Range   Glucose-Capillary 184 (*) 70 - 99 mg/dL  BASIC METABOLIC PANEL     Status: Abnormal   Collection Time   10/07/11  4:13 AM      Component Value Range   Sodium 139  135 - 145 mEq/L   Potassium 3.5  3.5 - 5.1 mEq/L   Chloride 102  96 - 112 mEq/L   CO2 30  19 - 32 mEq/L   Glucose, Bld 178 (*) 70 - 99 mg/dL   BUN 23  6 - 23 mg/dL   Creatinine, Ser 1.61  0.50 - 1.10 mg/dL   Calcium 9.0  8.4 - 09.6 mg/dL   GFR calc non Af Amer >90  >90 mL/min   GFR calc Af Amer >90  >90 mL/min  MAGNESIUM     Status: Normal   Collection  Time   10/07/11  4:13 AM      Component Value Range   Magnesium 2.1  1.5 - 2.5 mg/dL  PHOSPHORUS     Status: Normal   Collection Time   10/07/11  4:13 AM      Component Value Range   Phosphorus 2.8  2.3 - 4.6 mg/dL  CBC     Status: Abnormal   Collection Time   10/07/11  4:13 AM      Component Value Range   WBC 18.3 (*) 4.0 - 10.5 K/uL   RBC 4.28  3.87 - 5.11 MIL/uL   Hemoglobin 11.2 (*) 12.0 - 15.0 g/dL   HCT 04.5 (*) 40.9 - 81.1 %   MCV 81.5  78.0 - 100.0 fL   MCH 26.2  26.0 - 34.0 pg  MCHC 32.1  30.0 - 36.0 g/dL   RDW 09.8  11.9 - 14.7 %   Platelets 411 (*) 150 - 400 K/uL    Intake/Output Summary (Last 24 hours) at 10/07/11 8295 Last data filed at 10/07/11 0600  Gross per 24 hour  Intake   1970 ml  Output   3510 ml  Net  -1540 ml    ASSESSMENT AND PLAN:   Cardiac arrest - ventricular fibrillation: We will plan a cardiac cath on Monday.  I discussed this with her.    CORONARY ARTERY DISEASE As above.   Acute respiratory failure Now trached.  Plan per CCM  PAF (paroxysmal atrial fibrillation) Eventual warfarin again.  Hyperlipidemia Restart Lipitor    Alicia Guerrero 10/07/2011 8:07 AM

## 2011-10-08 LAB — GLUCOSE, CAPILLARY
Glucose-Capillary: 133 mg/dL — ABNORMAL HIGH (ref 70–99)
Glucose-Capillary: 128 mg/dL — ABNORMAL HIGH (ref 70–99)
Glucose-Capillary: 165 mg/dL — ABNORMAL HIGH (ref 70–99)

## 2011-10-08 LAB — BASIC METABOLIC PANEL
CO2: 30 mEq/L (ref 19–32)
Calcium: 8.8 mg/dL (ref 8.4–10.5)
Chloride: 100 mEq/L (ref 96–112)
Glucose, Bld: 150 mg/dL — ABNORMAL HIGH (ref 70–99)
Sodium: 137 mEq/L (ref 135–145)

## 2011-10-08 MED ORDER — SODIUM CHLORIDE 0.9 % IJ SOLN
10.0000 mL | Freq: Two times a day (BID) | INTRAMUSCULAR | Status: DC
  Administered 2011-10-08: 30 mL via INTRAVENOUS
  Administered 2011-10-09 – 2011-10-11 (×2): 10 mL via INTRAVENOUS
  Administered 2011-10-12: 3 mL via INTRAVENOUS

## 2011-10-08 MED ORDER — PANTOPRAZOLE SODIUM 40 MG PO PACK
40.0000 mg | PACK | Freq: Every day | ORAL | Status: DC
  Administered 2011-10-08 – 2011-10-10 (×2): 40 mg
  Filled 2011-10-08 (×3): qty 20

## 2011-10-08 MED ORDER — POTASSIUM CHLORIDE 20 MEQ/15ML (10%) PO LIQD
40.0000 meq | ORAL | Status: AC
  Administered 2011-10-08 (×2): 40 meq
  Filled 2011-10-08: qty 30

## 2011-10-08 MED ORDER — SODIUM CHLORIDE 0.9 % IV SOLN: 250.0000 mL | INTRAVENOUS | Status: DC | PRN

## 2011-10-08 MED ORDER — SODIUM CHLORIDE 0.9 % IJ SOLN: 3.0000 mL | Freq: Two times a day (BID) | INTRAMUSCULAR | Status: DC

## 2011-10-08 MED ORDER — PANTOPRAZOLE SODIUM 40 MG PO TBEC
40.0000 mg | DELAYED_RELEASE_TABLET | Freq: Every day | ORAL | Status: DC
  Filled 2011-10-08: qty 1

## 2011-10-08 MED ORDER — SODIUM CHLORIDE 0.9 % IJ SOLN: 3.0000 mL | INTRAMUSCULAR | Status: DC | PRN

## 2011-10-08 MED ORDER — HALOPERIDOL LACTATE 5 MG/ML IJ SOLN
2.0000 mg | Freq: Four times a day (QID) | INTRAMUSCULAR | Status: DC | PRN
  Administered 2011-10-08 – 2011-10-10 (×3): 2 mg via INTRAVENOUS
  Filled 2011-10-08 (×2): qty 1
  Filled 2011-10-08: qty 0.4
  Filled 2011-10-08: qty 1

## 2011-10-08 MED ORDER — ASPIRIN 81 MG PO CHEW
324.0000 mg | CHEWABLE_TABLET | ORAL | Status: AC
  Administered 2011-10-09: 324 mg via ORAL
  Filled 2011-10-08: qty 4

## 2011-10-08 MED ORDER — SODIUM CHLORIDE 0.9 % IV SOLN
INTRAVENOUS | Status: DC
  Administered 2011-10-09 (×2): via INTRAVENOUS

## 2011-10-08 MED ORDER — POTASSIUM CHLORIDE CRYS ER 20 MEQ PO TBCR
40.0000 meq | EXTENDED_RELEASE_TABLET | Freq: Every day | ORAL | Status: DC
  Administered 2011-10-09 – 2011-10-12 (×4): 40 meq via ORAL
  Filled 2011-10-08 (×4): qty 2

## 2011-10-08 NOTE — Progress Notes (Signed)
SUBJECTIVE:  Awake and answers questions.  There is some confusion.  She denies chest pain but she says that she is not breathing well.     PHYSICAL EXAM Filed Vitals:   10/08/11 0600 10/08/11 0700 10/08/11 0723 10/08/11 0724  BP: 150/56 136/64  136/64  Pulse: 80 67  72  Temp:   99.6 F (37.6 C)   TempSrc:   Oral   Resp: 22 25  20   Height:      Weight:      SpO2: 97% 99%  99%   General:  No distress Lungs:  Decreased breath sounds Heart:  RRR Abdomen:  Positive bowel sounds, no rebound no guarding Extremities:  No edema.  LABS: Lab Results  Component Value Date   CKTOTAL 1385* 09/27/2011   CKMB 60.6* 09/27/2011   TROPONINI 5.77* 09/27/2011   Results for orders placed during the hospital encounter of 09/26/11 (from the past 24 hour(s))  GLUCOSE, CAPILLARY     Status: Abnormal   Collection Time   10/07/11 12:22 PM      Component Value Range   Glucose-Capillary 149 (*) 70 - 99 mg/dL   Comment 1 Notify RN    GLUCOSE, CAPILLARY     Status: Abnormal   Collection Time   10/07/11  4:44 PM      Component Value Range   Glucose-Capillary 150 (*) 70 - 99 mg/dL  GLUCOSE, CAPILLARY     Status: Abnormal   Collection Time   10/07/11  7:13 PM      Component Value Range   Glucose-Capillary 114 (*) 70 - 99 mg/dL  GLUCOSE, CAPILLARY     Status: Abnormal   Collection Time   10/07/11 11:25 PM      Component Value Range   Glucose-Capillary 166 (*) 70 - 99 mg/dL  GLUCOSE, CAPILLARY     Status: Abnormal   Collection Time   10/08/11  3:47 AM      Component Value Range   Glucose-Capillary 133 (*) 70 - 99 mg/dL  BASIC METABOLIC PANEL     Status: Abnormal   Collection Time   10/08/11  4:30 AM      Component Value Range   Sodium 137  135 - 145 mEq/L   Potassium 3.1 (*) 3.5 - 5.1 mEq/L   Chloride 100  96 - 112 mEq/L   CO2 30  19 - 32 mEq/L   Glucose, Bld 150 (*) 70 - 99 mg/dL   BUN 19  6 - 23 mg/dL   Creatinine, Ser 4.09 (*) 0.50 - 1.10 mg/dL   Calcium 8.8  8.4 - 81.1 mg/dL   GFR  calc non Af Amer >90  >90 mL/min   GFR calc Af Amer >90  >90 mL/min  GLUCOSE, CAPILLARY     Status: Abnormal   Collection Time   10/08/11  7:22 AM      Component Value Range   Glucose-Capillary 128 (*) 70 - 99 mg/dL   Comment 1 Notify RN      Intake/Output Summary (Last 24 hours) at 10/08/11 0758 Last data filed at 10/08/11 0700  Gross per 24 hour  Intake   1780 ml  Output   3550 ml  Net  -1770 ml    ASSESSMENT AND PLAN:   Cardiac arrest - ventricular fibrillation: We will plan a cardiac cath on Monday.  Given her confusion she will need to have consent signed by her family  CORONARY ARTERY DISEASE As above.   Acute  respiratory failure Now trached.  Plan per CCM  PAF (paroxysmal atrial fibrillation) Eventual warfarin again.  Hyperlipidemia Restarted Lipitor yesterday.     Rollene Rotunda 10/08/2011 7:58 AM

## 2011-10-08 NOTE — Progress Notes (Signed)
Patient placed in recliner chair with three person assist.  Is able to bear weight on both legs, but is unable to support self.  Posey belt placed while patient in chair as per order.  Refer to flow sheet.

## 2011-10-08 NOTE — Evaluation (Signed)
Clinical/Bedside Swallow Evaluation Patient Details  Name: Alicia Guerrero MRN: [id number] Date of Birth: Aug 11, 1935  Today's Date: 10/08/2011 Time: 1700-1730 SLP Time Calculation (min): 30 min  Past Medical History:  Past Medical History  Diagnosis Date  . Left bundle branch block   . Atrial fibrillation   . Acute myocardial infarction, unspecified site, episode of care unspecified   . Coronary atherosclerosis   . Pure hypercholesterolemia   . Unspecified essential hypertension   . Cough   . Other atopic dermatitis and related conditions   . Acute upper respiratory infections of unspecified site   . Allergic rhinitis due to pollen   . Unspecified asthma   . Other and unspecified ovarian cyst   . Personal history of malignant neoplasm of breast   . Personal history of hyperthyroidism   . Chronic eczema     hands  . Hx of coronary angioplasty     percutaneous transluminal  . Hemorrhoids   . Cancer    Past Surgical History:  Past Surgical History  Procedure Date  . Oophorectomy   . Abdominal hysterectomy   . Ptca   . Cholecystectomy   . Coronary stent placement may and  july 2012  . Breast lumpectomy 08/24/2003    right lumpectomy+sln,T2N0,ERPR+,Her2-  . Polypectomy   . Colonoscopy    HPI:  76 y/o female presented to [facility] ED with VT arrest requiring multiple shocks and amiodarone. Patient intubated in field on 09/26/11 to 10/05/11 .  Patient developed stridor s/p extubation and tracheostomy completed.  Patient seen by ST on 10/06/11 for PMSV evaluation .  Patient referred for BSE to assess risk for aspiration  and recommend safest, PO diet.    Assessment / Plan / Recommendation Clinical Impression  PMSV placed prior to PO trials.  Minimal pharyngeal dysphagia indicated marked mainly by slight delay in initiation. Wet coughs noted moderately after thin liquid trials however, RN reports cough is baseline.  Prior to initiatation of PO diet recommend objective assessment of FEES due  to  recent lengthy intubation and to rule out silent aspiration.   Recommend ice chips sparingly only after oral care for comfort.      Aspiration Risk  Moderate    Diet Recommendation Alternative means - temporary   Medication Administration: Via alternative means    Other  Recommendations Recommended Consults: FEES Oral Care Recommendations: Oral care QID   Follow Up Recommendations  Inpatient Rehab    Frequency and Duration min 2x/week  2 weeks       SLP Swallow Goals  Pending results of objective evaluation   Swallow Study Prior Functional Status      General Date of Onset: 09/26/11 HPI: 76 y/o female presented to [facility] ED with VT arrest requiring multiple shocks and amiodarone. Patient intubated in field on 09/26/11 to 10/05/11 .  Patient deloped stridor s/p extubation and tracheostomy completed.  Patient seen by ST on 10/06/11 for PMSV evaluation .  Patient referred for BSE to assess risk for aspiration  and recommend safest, PO diet.  Type of Study: Bedside swallow evaluation Previous Swallow Assessment: No prior reports in EPIC Diet Prior to this Study: NPO;Panda Temperature Spikes Noted: No Respiratory Status: Supplemental O2 delivered via (comment) (trach collar) Trach Size and Type: Cuff;#6 History of Recent Intubation: Yes Length of Intubations (days): 9 days Date extubated: 10/05/11 Behavior/Cognition: Alert;Cooperative;Pleasant mood Oral Cavity - Dentition: Missing dentition (edentulous top row, missing dentition bottom row) Self-Feeding Abilities: Able to feed self Patient Positioning: Upright  in bed Baseline Vocal Quality: Clear Volitional Cough: Strong Volitional Swallow: Able to elicit    Oral/Motor/Sensory Function Overall Oral Motor/Sensory Function: Appears within functional limits for tasks assessed   Ice Chips Ice chips: Within functional limits Presentation: Spoon   Thin Liquid Thin Liquid: Impaired Presentation: Cup;Spoon Pharyngeal  Phase  Impairments: Suspected delayed Swallow;Cough - Delayed    Nectar Thick Nectar Thick Liquid: Not tested   Honey Thick Honey Thick Liquid: Not tested   Puree Puree: Not tested   Solid    [name] M.S.,CCC-SLP [phone] Solid: Not tested       [facility] 10/08/2011,6:32 PM

## 2011-10-08 NOTE — Progress Notes (Signed)
Name: Alicia Guerrero MRN: [id number] DOB: [birth year]    LOS: 12 Date of admit : 09/26/2011 PCP is Illene Regulus, MD   Referring Provider:  [facility] Reason for Referral:  Cardiac arrest  PULMONARY / CRITICAL CARE MEDICINE  Patient summary:  76 yo with extensive cardiac history brought to [facility] ED after VT/VF arrest.  Transferred to [facility] for hypothermia protocol.  Events Since Admission: 9/10  VT/VF arrest, intubated, hypothermia initiated 9/11  Hypothermia stopped as multiple arrhythmias and coagulopathy 9/19 s/p tracheostomy (DF) >>   SUBJECTIVE/OVERNIGHT/INTERVAL HX Tolerated ATC all night, doing some PMV UP to chair this am   Vital Signs: Temp:  [98.7 F (37.1 C)-99.6 F (37.6 C)] 99.6 F (37.6 C) (09/22 0723) Pulse Rate:  [60-80] 72  (09/22 0724) Resp:  [16-27] 20  (09/22 0724) BP: (108-158)/(37-112) 136/64 mmHg (09/22 0724) SpO2:  [95 %-99 %] 99 % (09/22 0724) FiO2 (%):  [35 %] 35 % (09/22 0724)  Intake/Output Summary (Last 24 hours) at 10/08/11 0805 Last data filed at 10/08/11 0700  Gross per 24 hour  Intake   1740 ml  Output   3270 ml  Net  -1530 ml    Physical Examination: General:  Ill appearing obese woman, up to chair Neuro:  Follows commands RASS 0. Awake and tracks,  HEENT: PERRL Neck:  No JVD, trach site looks good Cardiovascular:  RRR, no murmurs Lungs:  Bilateral air entry, few rales Abdomen:  Soft, bowel sounds present Musculoskeletal:  No edema Skin:  Intact.  Principal Problem:  *Cardiac arrest - ventricular fibrillation Active Problems:  HYPERTENSION  CORONARY ARTERY DISEASE  Acute respiratory failure  Altered mental status  Acute non-ST segment elevation myocardial infarction  Hypokalemia  Sinus bradycardia  Hyperosmolality and/or hypernatremia  PAF (paroxysmal atrial fibrillation)  Dg Chest Port 1 View  10/07/2011  *RADIOLOGY REPORT*  Clinical Data: Tracheostomy tube, evaluate pulmonary infiltrates and lines  PORTABLE CHEST - 1 VIEW   Comparison: 10/06/2011; 10/05/2011; 10/02/2011  Findings: Grossly unchanged enlarged cardiac silhouette and mediastinal contours without chronic calcifications in the aortic arch and descending thoracic aorta.  Stable position of support apparatus.  Minimal improved aeration of the lungs with persistent basilar heterogeneous opacities, left greater than right.  Small left-sided pleural effusion is unchanged.  No pneumothorax. Unchanged bones.  IMPRESSION: 1.  Stable positioning of support apparatus.  No pneumothorax. 2.  Improved aeration of the lungs with persistent basilar opacities, left greater than right, likely atelectasis. 3.  Unchanged small left-sided effusion.   Original Report Authenticated By: Waynard Reeds, M.D.      ASSESSMENT AND PLAN  PULMONARY No results found for this basename: PHART:5,PCO2:5,PCO2ART:5,PO2ART:5,HCO3:5,O2SAT:5 in the last 168 hours Ventilator Settings: Vent Mode:  [-]  FiO2 (%):  [35 %] 35 %  CXR:  9/18 >> progressive B edema pattern, small B effusions.  ETT:  9/10>>> 9/16 ETT: 9/16 (reintubated for stridor) >> 9/19 Trach 9/19 (DF) >>   A:  Acute respiratory failure post cardiac arrest. CT 9/13 - neg for PE. Progression to extubation initially limited by MS and agitation, failed extubation 9/16 due to stridor P:   - ATC >> goal 24x7  - d/c ativan, give ambien per tube tonight - Has diuresed 7.0L last 4 days with improved CXR.  Also consider ALI or infxn (on Levaquin) - d/c'd solumedrol 9/20  CARDIOVASCULAR  Lab 10/04/11 0425 10/02/11 0300  TROPONINI -- --  LATICACIDVEN -- --  PROBNP 5880.0* 1114.0*   TTE:  9/10 >>>  EF 45%, severe hypokinesis of inferior myocardium consistent with MI. ECG:  9/11 >>> LBBB Lines: L IJ TLC 9/10 >>  A: VT/VF arrest. NSTEMI.  Arrhythmias, Atrial fib.  Shock, likely cardiogenic, resolved, off pressors. P:  Heparin gtt d/c'd 9/19 (for ACS and A Fib), likely restart hep to coumadin after ACS workup started maintenance  lasix 9/20 low dose beta blocker  Hold ACE for now ASA lipitor restarted 9/21 elective cath on Monday 9/23 She may need AICD    RENAL  Lab 10/08/11 0430 10/07/11 0413 10/06/11 0455 10/05/11 0400 10/04/11 1807 10/04/11 0425 10/02/11 0300  NA 137 139 141 142 142 -- --  K 3.1* 3.5 -- -- -- -- --  CL 100 102 100 103 102 -- --  CO2 30 30 32 32 31 -- --  BUN 19 23 26* 25* 22 -- --  CREATININE 0.45* 0.52 0.55 0.58 0.51 -- --  CALCIUM 8.8 9.0 8.9 8.8 8.8 -- --  MG -- 2.1 -- -- -- 2.0 2.4  PHOS -- 2.8 -- -- -- 4.0 2.8   Intake/Output      09/21 0701 - 09/22 0700 09/22 0701 - 09/23 0700   I.V. (mL/kg) 460 (6)    NG/GT 1320    IV Piggyback     Total Intake(mL/kg) 1780 (23.1)    Urine (mL/kg/hr) 3050 (1.6)    Stool 500    Total Output 3550    Net -1770          Foley:  9/10 >>>  A: Normal renal function.  Hypokalemia and hypophos Hypernatremia P:   Trend BMP and mag and phos Hyponatremia improved with free water K replaced this am, add scheduled KCl 9/22  GASTROINTESTINAL  Lab 10/04/11 0425  AST 20  ALT 36*  ALKPHOS 84  BILITOT 0.3  PROT 5.5*  ALBUMIN 2.2*   A:  Shocked liver, improved transaminases. P:   TF, assess for PMV and PO per speech rx  HEMATOLOGIC  Lab 10/07/11 0413 10/06/11 0455 10/05/11 0400 10/04/11 1610 10/04/11 0425 10/03/11 0428  HGB 11.2* 11.0* 10.2* -- 10.0* 9.4*  HCT 34.9* 34.1* 32.2* -- 31.6* 29.5*  PLT 411* 397 381 -- 332 274  INR -- -- -- 1.51* -- --  APTT -- -- -- -- -- --   A:  Coagulopathy (Coumadin induced, exacerbated by hypothermia) - resolved. Anemia, no overt hemorrhage. P: Trend CBC PRBC for hgb < 8gm% due to MI Holding Coumadin  INFECTIOUS  Lab 10/07/11 0413 10/06/11 0455 10/05/11 0400 10/04/11 0425 10/03/11 0428  WBC 18.3* 21.2* 19.4* 19.9* 14.4*  PROCALCITON -- -- -- -- --   Cultures: 9/12  Blood >>> negative 9/12  Urine >>> E.Coli 9/12  Respiratory >>> E coli 9/12  C.diff PCR >>> neg 9/19  LLL BAL >> normal  flora  Antibiotics: 9/12  Levaquin (empiric, E coli) >>> 9/21 9/12  Flagyl (empirical, anaerobes, C.diff) >>> 9/16  A:  UTI.  ? Legionella pneumonia (antigen negative); E coli in sputum P: - finished levaquin 9/21 - d/c'd flagyl 9/16  ENDOCRINE  Lab 10/08/11 0722 10/08/11 0347 10/07/11 2325 10/07/11 1913 10/07/11 1644  GLUCAP 128* 133* 166* 114* 150*   A:  Hyperglycemia.  Presumed relative adrenal insufficiency. Hydrocort stopped 09/30/11 P:   SSI Lantus  NEUROLOGIC A:  Acute encephalopathy, improving.  Significant anxiety component. P:   - stop ativan, use ambien to help w sleep wake cycle, add prn haldol for nocturnal agitation  BEST PRACTICES / DISPO  ICU status Full code TF >> assess for PO's Protonix Heparin subcut Skin intact Family - discussed 9/21   Levy Pupa, MD, PhD 10/08/2011, 8:05 AM [facility] [phone] or if no answer [phone]

## 2011-10-09 ENCOUNTER — Encounter (HOSPITAL_COMMUNITY)
Admission: EM | Disposition: A | Discharge status: Skilled Nursing Facility | Payer: Self-pay | Source: Home / Self Care | Attending: Pulmonary Disease

## 2011-10-09 ENCOUNTER — Inpatient Hospital Stay (HOSPITAL_COMMUNITY): Payer: Medicare Other

## 2011-10-09 DIAGNOSIS — I2510 Atherosclerotic heart disease of native coronary artery without angina pectoris: Secondary | ICD-10-CM

## 2011-10-09 HISTORY — PX: LEFT HEART CATHETERIZATION WITH CORONARY ANGIOGRAM: SHX5451

## 2011-10-09 LAB — BASIC METABOLIC PANEL
CO2: 29 mEq/L (ref 19–32)
Calcium: 9.1 mg/dL (ref 8.4–10.5)
Chloride: 100 mEq/L (ref 96–112)
Glucose, Bld: 113 mg/dL — ABNORMAL HIGH (ref 70–99)
Sodium: 139 mEq/L (ref 135–145)

## 2011-10-09 LAB — GLUCOSE, CAPILLARY
Glucose-Capillary: 128 mg/dL — ABNORMAL HIGH (ref 70–99)
Glucose-Capillary: 137 mg/dL — ABNORMAL HIGH (ref 70–99)

## 2011-10-09 LAB — CBC
HCT: 35.7 % — ABNORMAL LOW (ref 36.0–46.0)
Hemoglobin: 11.6 g/dL — ABNORMAL LOW (ref 12.0–15.0)
MCH: 26.4 pg (ref 26.0–34.0)
MCV: 81.3 fL (ref 78.0–100.0)
Platelets: 483 10*3/uL — ABNORMAL HIGH (ref 150–400)
RBC: 4.39 MIL/uL (ref 3.87–5.11)

## 2011-10-09 SURGERY — LEFT HEART CATHETERIZATION WITH CORONARY ANGIOGRAM
Anesthesia: LOCAL

## 2011-10-09 MED ORDER — SODIUM CHLORIDE 0.9 % IV SOLN: INTRAVENOUS | Status: AC

## 2011-10-09 MED ORDER — HEPARIN SODIUM (PORCINE) 1000 UNIT/ML IJ SOLN
INTRAMUSCULAR | Status: AC
  Filled 2011-10-09: qty 1

## 2011-10-09 MED ORDER — MIDAZOLAM HCL 2 MG/2ML IJ SOLN
INTRAMUSCULAR | Status: AC
  Filled 2011-10-09: qty 2

## 2011-10-09 MED ORDER — SODIUM CHLORIDE 0.9 % IJ SOLN: 3.0000 mL | Freq: Two times a day (BID) | INTRAMUSCULAR | Status: DC

## 2011-10-09 MED ORDER — FREE WATER
200.0000 mL | Freq: Three times a day (TID) | Status: DC
  Administered 2011-10-09 – 2011-10-10 (×3): 200 mL

## 2011-10-09 MED ORDER — ACETAMINOPHEN 325 MG PO TABS
650.0000 mg | ORAL_TABLET | ORAL | Status: DC | PRN
  Administered 2011-10-09 – 2011-10-10 (×2): 650 mg via ORAL
  Filled 2011-10-09 (×2): qty 2

## 2011-10-09 MED ORDER — SODIUM CHLORIDE 0.9 % IV SOLN: 1.0000 mL/kg/h | INTRAVENOUS | Status: DC

## 2011-10-09 MED ORDER — HEPARIN (PORCINE) IN NACL 2-0.9 UNIT/ML-% IJ SOLN
INTRAMUSCULAR | Status: AC
  Filled 2011-10-09: qty 1000

## 2011-10-09 MED ORDER — WARFARIN SODIUM 7.5 MG PO TABS
7.5000 mg | ORAL_TABLET | Freq: Once | ORAL | Status: AC
  Administered 2011-10-09: 7.5 mg via ORAL
  Filled 2011-10-09: qty 1

## 2011-10-09 MED ORDER — NITROGLYCERIN 0.2 MG/ML ON CALL CATH LAB
INTRAVENOUS | Status: AC
  Filled 2011-10-09: qty 1

## 2011-10-09 MED ORDER — SODIUM CHLORIDE 0.9 % IJ SOLN: 3.0000 mL | INTRAMUSCULAR | Status: DC | PRN

## 2011-10-09 MED ORDER — SODIUM CHLORIDE 0.9 % IV SOLN: 250.0000 mL | INTRAVENOUS | Status: DC | PRN

## 2011-10-09 MED ORDER — ONDANSETRON HCL 4 MG/2ML IJ SOLN
4.0000 mg | Freq: Four times a day (QID) | INTRAMUSCULAR | Status: DC | PRN
  Administered 2011-10-10: 4 mg via INTRAVENOUS
  Filled 2011-10-09: qty 2

## 2011-10-09 MED ORDER — LIDOCAINE HCL (PF) 1 % IJ SOLN
INTRAMUSCULAR | Status: AC
  Filled 2011-10-09: qty 30

## 2011-10-09 MED ORDER — PRO-STAT SUGAR FREE PO LIQD
30.0000 mL | Freq: Every day | ORAL | Status: DC
  Administered 2011-10-10: 30 mL via ORAL
  Filled 2011-10-09: qty 30

## 2011-10-09 MED ORDER — PROMOTE PO LIQD
1000.0000 mL | ORAL | Status: DC
  Administered 2011-10-09: 1000 mL
  Filled 2011-10-09 (×3): qty 1000

## 2011-10-09 MED ORDER — FENTANYL CITRATE 0.05 MG/ML IJ SOLN
INTRAMUSCULAR | Status: AC
  Filled 2011-10-09: qty 2

## 2011-10-09 MED ORDER — WARFARIN - PHARMACIST DOSING INPATIENT: Freq: Every day | Status: DC

## 2011-10-09 MED ORDER — VERAPAMIL HCL 2.5 MG/ML IV SOLN
INTRAVENOUS | Status: AC
  Filled 2011-10-09: qty 2

## 2011-10-09 NOTE — H&P (View-Only) (Signed)
   TELEMETRY: Reviewed telemetry pt in NSR: Filed Vitals:   10/09/11 0600 10/09/11 0700 10/09/11 0800 10/09/11 0808  BP: 148/60 127/69 137/72 137/72  Pulse: 74 90 73 84  Temp:  99.2 F (37.3 C)    TempSrc:  Oral    Resp: 22 26 21 24   Height:      Weight:      SpO2: 98% 93% 99% 98%    Intake/Output Summary (Last 24 hours) at 10/09/11 0859 Last data filed at 10/09/11 0800  Gross per 24 hour  Intake   1275 ml  Output   2315 ml  Net  -1040 ml    SUBJECTIVE Sedated, some confusion. Denies chest pain or SOB  LABS: Basic Metabolic Panel:  Basename 10/09/11 0424 10/08/11 0430 10/07/11 0413  NA 139 137 --  K 3.6 3.1* --  CL 100 100 --  CO2 29 30 --  GLUCOSE 113* 150* --  BUN 20 19 --  CREATININE 0.42* 0.45* --  CALCIUM 9.1 8.8 --  MG -- -- 2.1  PHOS -- -- 2.8   CBC:  Basename 10/09/11 0424 10/07/11 0413  WBC 15.6* 18.3*  NEUTROABS -- --  HGB 11.6* 11.2*  HCT 35.7* 34.9*  MCV 81.3 81.5  PLT 483* 411*    Radiology/Studies:  Dg Chest Port 1 View  10/07/2011  *RADIOLOGY REPORT*  Clinical Data: Tracheostomy tube, evaluate pulmonary infiltrates and lines  PORTABLE CHEST - 1 VIEW  Comparison: 10/06/2011; 10/05/2011; 10/02/2011  Findings: Grossly unchanged enlarged cardiac silhouette and mediastinal contours without chronic calcifications in the aortic arch and descending thoracic aorta.  Stable position of support apparatus.  Minimal improved aeration of the lungs with persistent basilar heterogeneous opacities, left greater than right.  Small left-sided pleural effusion is unchanged.  No pneumothorax. Unchanged bones.  IMPRESSION: 1.  Stable positioning of support apparatus.  No pneumothorax. 2.  Improved aeration of the lungs with persistent basilar opacities, left greater than right, likely atelectasis. 3.  Unchanged small left-sided effusion.   Original Report Authenticated By: Waynard Reeds, M.D.    PHYSICAL EXAM General: Elderly, chronically ill, in no acute  distress. Head: Trach Neck: Negative for carotid bruits. JVD not elevated. Lungs: Decreased BS bilateral Heart: RRR S1 S2 without murmurs, rubs, or gallops.  Abdomen: Soft, non-tender, non-distended with normoactive bowel sounds. No hepatomegaly. No rebound/guarding. No obvious abdominal masses. Extremities: No clubbing, cyanosis or edema.  Distal pedal and radial pulses are 2+ and equal bilaterally.   ASSESSMENT AND PLAN: 1. PEA arrest/ Vib  2. NSTEMI  3. CAD s/p mulitple coronary stents (BMS and DES). RCA,LAD, and diagonal.  4. LBBB  5.Acute respiratory failure. S/p trach for stridor.  6. CHF acute systolic EF 40-45% by Echo.  7. Paroxysmal atrial fibrillation.  8. Altered mental status.  Plan: will proceed with cardiac cath today. Procedure and risks reviewed with patient's husband since her confusion precludes her giving consent.    Principal Problem:  *Cardiac arrest - ventricular fibrillation Active Problems:  HYPERTENSION  CORONARY ARTERY DISEASE  Acute respiratory failure  Altered mental status  Acute non-ST segment elevation myocardial infarction  Hypokalemia  Sinus bradycardia  Hyperosmolality and/or hypernatremia  PAF (paroxysmal atrial fibrillation)    Signed, Philemon Riedesel [place] MD,FACC 10/09/2011 9:06 AM

## 2011-10-09 NOTE — Progress Notes (Signed)
SLP attempted to assess pt for readiness for FEES swallow eval this am. Pt was NPO for cardiac cath which was performed around 1230 today. Pt will need to lay flat for 4 hours after procedure. WIll defer swallow eval till tomorrow. Pt will need NG tube removed prior to any objective testing. Thanks, Harlon Ditty, MA CCC-SLP [phone]

## 2011-10-09 NOTE — Interval H&P Note (Signed)
History and Physical Interval Note:  10/09/2011 12:16 PM  Alicia Guerrero  has presented today for surgery, with the diagnosis of cardiac arrest  The various methods of treatment have been discussed with the patient and family. After consideration of risks, benefits and other options for treatment, the patient has consented to  Procedure(s) (LRB) with comments: LEFT HEART CATHETERIZATION WITH CORONARY ANGIOGRAM (N/A) as a surgical intervention .  The patient's history has been reviewed, patient examined, no change in status, stable for surgery.  I have reviewed the patient's chart and labs.  Questions were answered to the patient's satisfaction.     Aitan Rossbach [employer]

## 2011-10-09 NOTE — Progress Notes (Signed)
   TELEMETRY: Reviewed telemetry pt in NSR: Filed Vitals:   10/09/11 0600 10/09/11 0700 10/09/11 0800 10/09/11 0808  BP: 148/60 127/69 137/72 137/72  Pulse: 74 90 73 84  Temp:  99.2 F (37.3 C)    TempSrc:  Oral    Resp: 22 26 21 24  Height:      Weight:      SpO2: 98% 93% 99% 98%    Intake/Output Summary (Last 24 hours) at 10/09/11 0859 Last data filed at 10/09/11 0800  Gross per 24 hour  Intake   1275 ml  Output   2315 ml  Net  -1040 ml    SUBJECTIVE Sedated, some confusion. Denies chest pain or SOB  LABS: Basic Metabolic Panel:  Basename 10/09/11 0424 10/08/11 0430 10/07/11 0413  NA 139 137 --  K 3.6 3.1* --  CL 100 100 --  CO2 29 30 --  GLUCOSE 113* 150* --  BUN 20 19 --  CREATININE 0.42* 0.45* --  CALCIUM 9.1 8.8 --  MG -- -- 2.1  PHOS -- -- 2.8   CBC:  Basename 10/09/11 0424 10/07/11 0413  WBC 15.6* 18.3*  NEUTROABS -- --  HGB 11.6* 11.2*  HCT 35.7* 34.9*  MCV 81.3 81.5  PLT 483* 411*    Radiology/Studies:  Dg Chest Port 1 View  10/07/2011  *RADIOLOGY REPORT*  Clinical Data: Tracheostomy tube, evaluate pulmonary infiltrates and lines  PORTABLE CHEST - 1 VIEW  Comparison: 10/06/2011; 10/05/2011; 10/02/2011  Findings: Grossly unchanged enlarged cardiac silhouette and mediastinal contours without chronic calcifications in the aortic arch and descending thoracic aorta.  Stable position of support apparatus.  Minimal improved aeration of the lungs with persistent basilar heterogeneous opacities, left greater than right.  Small left-sided pleural effusion is unchanged.  No pneumothorax. Unchanged bones.  IMPRESSION: 1.  Stable positioning of support apparatus.  No pneumothorax. 2.  Improved aeration of the lungs with persistent basilar opacities, left greater than right, likely atelectasis. 3.  Unchanged small left-sided effusion.   Original Report Authenticated By: JOHN A. WATTS V, M.D.    PHYSICAL EXAM General: Elderly, chronically ill, in no acute  distress. Head: Trach Neck: Negative for carotid bruits. JVD not elevated. Lungs: Decreased BS bilateral Heart: RRR S1 S2 without murmurs, rubs, or gallops.  Abdomen: Soft, non-tender, non-distended with normoactive bowel sounds. No hepatomegaly. No rebound/guarding. No obvious abdominal masses. Extremities: No clubbing, cyanosis or edema.  Distal pedal and radial pulses are 2+ and equal bilaterally.   ASSESSMENT AND PLAN: 1. PEA arrest/ Vib  2. NSTEMI  3. CAD s/p mulitple coronary stents (BMS and DES). RCA,LAD, and diagonal.  4. LBBB  5.Acute respiratory failure. S/p trach for stridor.  6. CHF acute systolic EF 40-45% by Echo.  7. Paroxysmal atrial fibrillation.  8. Altered mental status.  Plan: will proceed with cardiac cath today. Procedure and risks reviewed with patient's husband since her confusion precludes her giving consent.    Principal Problem:  *Cardiac arrest - ventricular fibrillation Active Problems:  HYPERTENSION  CORONARY ARTERY DISEASE  Acute respiratory failure  Altered mental status  Acute non-ST segment elevation myocardial infarction  Hypokalemia  Sinus bradycardia  Hyperosmolality and/or hypernatremia  PAF (paroxysmal atrial fibrillation)    Signed, Olisa Quesnel MD,FACC 10/09/2011 9:06 AM    

## 2011-10-09 NOTE — Progress Notes (Addendum)
ANTICOAGULATION CONSULT NOTE - Initial Consult  Pharmacy Consult for coumadin Indication: atrial fibrillation  Allergies  Allergen Reactions  . Meperidine Hcl Other (See Comments)    Mixed with phenergan swelling of the mouth   . Penicillins Other (See Comments)    Unknown reaction  . Molds & Smuts Other (See Comments)    Runny nose  . Tape Other (See Comments)    Redness, pulls skin off    Patient Measurements: Height: 5' 3.5" (161.3 cm) Weight: 169 lb 15.6 oz (77.1 kg) IBW/kg (Calculated) : 53.55    Vital Signs: Temp: 99.2 F (37.3 C) (09/23 0700) Temp src: Oral (09/23 0700) BP: 119/45 mmHg (09/23 1135) Pulse Rate: 68  (09/23 1135)  Labs:  Basename 10/09/11 0424 10/08/11 0430 10/07/11 0413  HGB 11.6* -- 11.2*  HCT 35.7* -- 34.9*  PLT 483* -- 411*  APTT -- -- --  LABPROT -- -- --  INR -- -- --  HEPARINUNFRC -- -- --  CREATININE 0.42* 0.45* 0.52  CKTOTAL -- -- --  CKMB -- -- --  TROPONINI -- -- --    Estimated Creatinine Clearance: 61.4 ml/min (by C-G formula based on Cr of 0.42).   Medical History: Past Medical History  Diagnosis Date  . Left bundle branch block   . Atrial fibrillation   . Acute myocardial infarction, unspecified site, episode of care unspecified   . Coronary atherosclerosis   . Pure hypercholesterolemia   . Unspecified essential hypertension   . Cough   . Other atopic dermatitis and related conditions   . Acute upper respiratory infections of unspecified site   . Allergic rhinitis due to pollen   . Unspecified asthma   . Other and unspecified ovarian cyst   . Personal history of malignant neoplasm of breast   . Personal history of hyperthyroidism   . Chronic eczema     hands  . Hx of coronary angioplasty     percutaneous transluminal  . Hemorrhoids   . Cancer     Medications:  Prescriptions prior to admission  Medication Sig Dispense Refill  . amLODipine (NORVASC) 10 MG tablet Take 10 mg by mouth daily.      Marland Kitchen aspirin  EC 81 MG tablet Take 81 mg by mouth every evening.       Marland Kitchen atorvastatin (LIPITOR) 40 MG tablet Take 40 mg by mouth daily.      . furosemide (LASIX) 40 MG tablet Take 40 mg by mouth daily.      . Multiple Vitamin (MULTIVITAMIN WITH MINERALS) TABS Take 1 tablet by mouth daily.      . nitroGLYCERIN (NITROSTAT) 0.4 MG SL tablet Place 0.4 mg under the tongue every 5 (five) minutes x 3 doses as needed. Chest pain      . OVER THE COUNTER MEDICATION Take 1 tablet by mouth daily. Calcium, Magnesium, Zinc, with D3      . OVER THE COUNTER MEDICATION Apply 1 application topically 4 (four) times daily as needed. For pain to joints  Actvon      . potassium chloride SA (K-DUR,KLOR-CON) 20 MEQ tablet Take 20 mEq by mouth daily.      . trandolapril ([name]) 4 MG tablet Take 4 mg by mouth daily.      Marland Kitchen warfarin (COUMADIN) 5 MG tablet Take 5 mg by mouth daily.        Assessment: 76 yo female s/p cath with PAF on coumadin PTA (home regimen is 5mg /day). Last INR was 1.51 on 10/04/11.  Patient was on heparin 9/19 but this was discontinued for blood-tinged secretions from trach. Patient noted on sq heparin q8hr  Goal of Therapy:  INR 2-3 Monitor platelets by anticoagulation protocol: Yes   Plan:  -Coumadin 7.5mg  po today -Daily INR -Could consider a heparin infusion?  Harland German, Pharm D 10/09/2011 1:30 PM

## 2011-10-09 NOTE — Evaluation (Signed)
Physical Therapy Evaluation Patient Details Name: Alicia Guerrero MRN: [id number] DOB: Aug 23, 1935 Today's Date: 10/09/2011 Time: 4098-1191 PT Time Calculation (min): 26 min  PT Assessment / Plan / Recommendation Clinical Impression  Patient s/p VT arrest, VDRF and trach with decr mobility secondary to deconditioning.  Patient will be a good rehab candidate.  MD:  Please order Rehab if you agree.  Thanks.    PT Assessment  Patient needs continued PT services    Follow Up Recommendations  Inpatient Rehab    Barriers to Discharge        Equipment Recommendations  None recommended by PT    Recommendations for Other Services Rehab consult   Frequency Min 3X/week    Precautions / Restrictions Precautions Precautions: Fall Restrictions Weight Bearing Restrictions: No   Pertinent Vitals/Pain VSS, No pain      Mobility  Bed Mobility Bed Mobility: Rolling Right;Rolling Left;Right Sidelying to Sit;Sitting - Scoot to [place] of Bed;Scooting to [facility] Rolling Right: 5: Supervision;With rail Rolling Left: 5: Supervision;With rail Right Sidelying to Sit: 4: Min assist;With rails;HOB flat Sitting - Scoot to Edge of Bed: 4: Min assist Scooting to [facility]: 4: Min assist;With rail Details for Bed Mobility Assistance: pt. needed cues for technique Transfers Transfers: Not assessed Details for Transfer Assistance: Pt. going to CATH lab soon and nursing did not want patient OOB.   Ambulation/Gait Ambulation/Gait Assistance: Not tested (comment) Stairs: No Wheelchair Mobility Wheelchair Mobility: No         PT Diagnosis: Generalized weakness  PT Problem List: Decreased activity tolerance;Decreased balance;Decreased mobility;Decreased safety awareness;Decreased knowledge of use of DME PT Treatment Interventions: DME instruction;Gait training;Functional mobility training;Wheelchair mobility training;Therapeutic activities;Therapeutic exercise;Balance training;Patient/family education;Cognitive  remediation   PT Goals Acute Rehab PT Goals PT Goal Formulation: With patient Time For Goal Achievement: 10/23/11 Potential to Achieve Goals: Good Pt will go Supine/Side to Sit: Independently PT Goal: Supine/Side to Sit - Progress: Goal set today Pt will Sit at Edge of Bed: Independently;3-5 min;with no upper extremity support PT Goal: Sit at Edge Of Bed - Progress: Goal set today Pt will go Sit to Stand: Independently PT Goal: Sit to Stand - Progress: Goal set today Pt will Transfer Bed to Chair/Chair to Bed: Independently PT Transfer Goal: Bed to Chair/Chair to Bed - Progress: Goal set today Pt will Ambulate: >150 feet;with modified independence;with least restrictive assistive device PT Goal: Ambulate - Progress: Goal set today  Visit Information  Last PT Received On: 10/09/11 Assistance Needed: +2    Subjective Data  Subjective: Pt with trach. No verbalizations, shakes head to yes no questions Patient Stated Goal: To go home   Prior Functioning  Home Living Lives With: Spouse Available Help at Discharge: Family Type of Home: House Home Access: Ramped entrance Home Layout: One level Bathroom Shower/Tub: Health visitor: Standard Home Adaptive Equipment: Bedside commode/3-in-1;Shower chair without back;Walker - rolling;Straight cane Prior Function Level of Independence: Independent Able to Take Stairs?: Yes Driving: Yes Vocation: Retired Musician: No difficulties    Cognition  Overall Cognitive Status: Impaired Area of Impairment: Following commands;Safety/judgement;Awareness of deficits;Problem solving Arousal/Alertness: Awake/alert Orientation Level: Appears intact for tasks assessed Behavior During Session: [facility] for tasks performed Following Commands: Follows one step commands inconsistently;Follows one step commands with increased time Safety/Judgement: Decreased awareness of safety precautions;Decreased safety judgement for  tasks assessed;Decreased awareness of need for assistance Safety/Judgement - Other Comments: Impulsive with movements    Extremity/Trunk Assessment Right Upper Extremity Assessment RUE ROM/Strength/Tone: [facility] for tasks assessed  Left Upper Extremity Assessment LUE ROM/Strength/Tone: [facility] for tasks assessed Right Lower Extremity Assessment RLE ROM/Strength/Tone: [facility] for tasks assessed Left Lower Extremity Assessment LLE ROM/Strength/Tone: [facility] for tasks assessed Trunk Assessment Trunk Assessment: Normal   Balance Static Sitting Balance Static Sitting - Balance Support: Feet supported;No upper extremity supported Static Sitting - Level of Assistance: 5: Stand by assistance Static Sitting - Comment/# of Minutes: 2 minutes.  Needed stand by assist due to impulsive movements at times at EOB.    End of Session PT - End of Session Equipment Utilized During Treatment: Gait belt;Oxygen Activity Tolerance: Patient tolerated treatment well Patient left: in bed;with call bell/phone within reach;with family/visitor present Nurse Communication: Mobility status      INGOLD,Shem Plemmons 10/09/2011, 12:02 PM  [facility] [phone] (pager)

## 2011-10-09 NOTE — Progress Notes (Signed)
Nutrition Follow-up  Intervention:   1. Recommend continue with TF until able to safely take po's 2. Adjust TF rate to better meet nutrition needs. Once TF resumed after procedure, initiate at 20 ml/hr and increase by 10 ml q 4 hours to a goal rate of 60 ml/hr. Decrease 30 ml Pro-stat to once daily. This will provide 1540 kcal, 105 gm protein, and 1208 ml free water.  3. Free water flushes like will need to be adjusted with increased water in TF.   Assessment:   Trach on 9/19. Now off vent support. TF was continued after Trach placed until able to tolerate PMV and pass swallow evaluation. Seen by SLP for swallow 9/22-recommended NPO. Still with some confusion per notes.  Currently TF off for cath planned later today.   TF orders: Promote @ 20 ml/hr and 30 ml Pro-stat 5 times daily. This provides 980 kcal, 105 gm protein, 403 ml free water.  Free water flushes: 200 ml q 8 hours, 600 ml water daily from flushes.   Total free water 1003 ml daily.   Diet Order:  NPO  Meds: Scheduled Meds:    . antiseptic oral rinse  15 mL Mouth Rinse Q4H  . aspirin  324 mg Oral Pre-Cath  . aspirin  325 mg Per Tube Daily  . atorvastatin  40 mg Oral q1800  . chlorhexidine  15 mL Mouth/Throat BID  . feeding supplement  30 mL Oral 5 X Daily  . feeding supplement (PROMOTE)  1,000 mL Per Tube Q24H  . free water  200 mL Per Tube Q8H  . furosemide  20 mg Intravenous Daily  . heparin subcutaneous  5,000 Units Subcutaneous Q8H  . insulin aspart  0-20 Units Subcutaneous Q4H  . insulin glargine  15 Units Subcutaneous QHS  . metoprolol tartrate  12.5 mg Oral BID  . multivitamin  5 mL Oral Daily  . pantoprazole sodium  40 mg Per Tube Q1200  . potassium chloride  40 mEq Oral Daily  . sodium chloride  10-30 mL Intravenous Q12H  . sodium chloride  3 mL Intravenous Q12H  . DISCONTD: free water  200 mL Per Tube Q6H  . DISCONTD: pantoprazole  40 mg Oral Q1200   Continuous Infusions:    . sodium chloride Stopped  (10/08/11 0715)  . sodium chloride 75 mL/hr at 10/09/11 0957   PRN Meds:.sodium chloride, fentaNYL, haloperidol lactate, sodium chloride  Labs:  CMP     Component Value Date/Time   NA 139 10/09/2011 0424   K 3.6 10/09/2011 0424   CL 100 10/09/2011 0424   CO2 29 10/09/2011 0424   GLUCOSE 113* 10/09/2011 0424   BUN 20 10/09/2011 0424   CREATININE 0.42* 10/09/2011 0424   CALCIUM 9.1 10/09/2011 0424   PROT 5.5* 10/04/2011 0425   ALBUMIN 2.2* 10/04/2011 0425   AST 20 10/04/2011 0425   ALT 36* 10/04/2011 0425   ALKPHOS 84 10/04/2011 0425   BILITOT 0.3 10/04/2011 0425   GFRNONAA >90 10/09/2011 0424   GFRAA >90 10/09/2011 0424     Intake/Output Summary (Last 24 hours) at 10/09/11 1118 Last data filed at 10/09/11 1000  Gross per 24 hour  Intake   1365 ml  Output   3155 ml  Net  -1790 ml    Weight Status:  169 lbs, continues to trend down   Re-estimated needs:  1500-1700 kcal 100-115 gm protein   Nutrition Dx:  Inadequate oral intake r/t inability to eat AEB NPO diet order  Goal: Meet 90-100% estimated nutrition needs with EN while unable to tolerate PO's, unmet  Monitor:  Swallow evaluations, PMV tolerance, weight, labs   Clarene Duke RD, LDN Pager [phone] After Hours pager [phone]

## 2011-10-09 NOTE — Progress Notes (Signed)
Name: Alicia Guerrero MRN: [id number] DOB: November 05, 1935    LOS: 13 Date of admit : 09/26/2011 PCP is Illene Regulus, MD   Referring Provider:  [facility] Reason for Referral:  Cardiac arrest  PULMONARY / CRITICAL CARE MEDICINE  Patient summary:  76 yo with extensive cardiac history brought to [facility] ED after VT/VF arrest.  Transferred to [facility] for hypothermia protocol.  Events Since Admission: 9/10  VT/VF arrest, intubated, hypothermia initiated 9/11  Hypothermia stopped as multiple arrhythmias and coagulopathy 9/19 s/p tracheostomy (DF) >>   SUBJECTIVE: Tolerating trach collar.  Denies chest pain.  Breathing okay.  Vital Signs: Temp:  [98.4 F (36.9 C)-99.4 F (37.4 C)] 99.2 F (37.3 C) (09/23 0700) Pulse Rate:  [61-91] 84  (09/23 0808) Resp:  [16-28] 24  (09/23 0808) BP: (114-175)/(35-111) 137/72 mmHg (09/23 0808) SpO2:  [93 %-100 %] 98 % (09/23 0808) FiO2 (%):  [35 %] 35 % (09/23 0808)  Intake/Output Summary (Last 24 hours) at 10/09/11 0919 Last data filed at 10/09/11 0800  Gross per 24 hour  Intake   1255 ml  Output   2315 ml  Net  -1060 ml    Physical Examination: General:  Ill appearing Neuro:  Follows commands RASS 0. Awake and tracks HEENT: PERRL Neck:  No JVD, trach site looks good Cardiovascular:  RRR, no murmurs Lungs:  Bilateral air entry, few rales Abdomen:  Soft, bowel sounds present Musculoskeletal:  No edema Skin:  Intact.   Dg Chest Port 1 View  10/09/2011  *RADIOLOGY REPORT*  Clinical Data: Short of breath.  Pulmonary infiltrates.  PORTABLE CHEST - 1 VIEW  Comparison: 10/07/2011.  Findings: The patient is rotated to the right.  Left IJ central line, tracheostomy and enteric tube appear unchanged.  Pulmonary aeration appears unchanged compared yesterday's exam allowing for differences in rotation.  IMPRESSION:  1.  Stable support apparatus. 2.  No change in the pulmonary aeration and with left basilar atelectasis.   Original Report Authenticated By: Andreas Newport, M.D.     ASSESSMENT AND PLAN  PULMONARY  Ventilator Settings: Vent Mode:  [-]  FiO2 (%):  [35 %] 35 %  ETT:  9/10>>> 9/16 ETT: 9/16 (reintubated for stridor) >> 9/19 Trach 9/19 (DF) >>   A:  Acute respiratory failure post cardiac arrest.  Mental status and stridor limitation to extubation.  Improved after trach 9/19.  Completed course of steroids 9/20 for upper airway. P:   Trach collar as tolerated Titrate oxygen to keep SpO2 > 92% Speech to follow up for speech valve F/u CXR intermittently  CARDIOVASCULAR  Lab 10/04/11 0425  TROPONINI --  LATICACIDVEN --  PROBNP 5880.0*   TTE:  9/10 >>> EF 45%, severe hypokinesis of inferior myocardium consistent with MI. Lines: L IJ TLC 9/10 >>  A: VT/VF arrest with NSTEMI and acute systolic CHF. Atrial fib.   Hyperlipidemia. Shock, likely cardiogenic>>resolved. P:  For cath 9/23 Continue ASA, lipitor, metoprolol Continue lasix to keep in negative fluid balance Defer decision for anticoagulation for A fib to cardiology Defer decision to add ACE inhibitor to cardiology For PICC line placement, and then d/c central line  RENAL  Lab 10/09/11 0424 10/08/11 0430 10/07/11 0413 10/06/11 0455 10/05/11 0400 10/04/11 0425  NA 139 137 139 141 142 --  K 3.6 3.1* -- -- -- --  CL 100 100 102 100 103 --  CO2 29 30 30  32 32 --  BUN 20 19 23  26* 25* --  CREATININE 0.42* 0.45* 0.52  0.55 0.58 --  CALCIUM 9.1 8.8 9.0 8.9 8.8 --  MG -- -- 2.1 -- -- 2.0  PHOS -- -- 2.8 -- -- 4.0   Intake/Output      09/22 0701 - 09/23 0700 09/23 0701 - 09/24 0700   I.V. (mL/kg) 205 (2.7) 75 (1)   NG/GT 1020    Total Intake(mL/kg) 1225 (15.9) 75 (1)   Urine (mL/kg/hr) 1915 (1)    Stool 400    Total Output 2315    Net -1090 +75         Foley:  9/10 >>>  A: Hypokalemia. Hypophosphatemia, Hypernatremia>>resolved P:   Monitor renal fx, urine outpt F/u and replace electrolytes as needed Decrease free water to 200 ml  q8h  GASTROINTESTINAL  Lab 10/04/11 0425  AST 20  ALT 36*  ALKPHOS 84  BILITOT 0.3  PROT 5.5*  ALBUMIN 2.2*   A:  Shocked liver>>resolved. Dysphagia. Nutrition. P:   Speech to follow up and assess swallowing Continue tube feeds for now  HEMATOLOGIC  Lab 10/09/11 0424 10/07/11 0413 10/06/11 0455 10/05/11 0400 10/04/11 1610 10/04/11 0425  HGB 11.6* 11.2* 11.0* 10.2* -- 10.0*  HCT 35.7* 34.9* 34.1* 32.2* -- 31.6*  PLT 483* 411* 397 381 -- 332  INR -- -- -- -- 1.51* --  APTT -- -- -- -- -- --   A:  Coagulopathy (Coumadin induced, exacerbated by hypothermia)>>resolved. Anemia of critical illness. P: F/u CBC Transfuse for Hb < 7  INFECTIOUS  Lab 10/09/11 0424 10/07/11 0413 10/06/11 0455 10/05/11 0400 10/04/11 0425  WBC 15.6* 18.3* 21.2* 19.4* 19.9*  PROCALCITON -- -- -- -- --   Cultures: 9/12  Blood >>> negative 9/12  Urine >>> E.Coli 9/12  Respiratory >>> E coli 9/12  C.diff PCR >>> neg 9/19  LLL BAL >> normal flora  Antibiotics: 9/12  Levaquin (empiric, E coli) >>> 9/21 9/12  Flagyl (empirical, anaerobes, C.diff) >>> 9/16  A:  E coli in sputum and urine>>completed course of ABx P: Monitor clinically  ENDOCRINE  Lab 10/09/11 0728 10/09/11 0316 10/08/11 2320 10/08/11 1912 10/08/11 1633  GLUCAP 128* 106* 139* 148* 139*   A:  Hyperglycemia.   Presumed relative adrenal insufficiency>> Hydrocortisone stopped 09/30/11 P:   SSI Lantus  NEUROLOGIC A:  Acute encephalopathy in setting of cardiac arrest>>improving.   Anxiety. P:   Prn haldol, fentanyl Try to avoid benzo's  BEST PRACTICES / DISPO ICU status Full code TF >> assess for PO's Protonix Heparin subcut  Coralyn Helling, MD [facility] Pulmonary/Critical Care 10/09/2011, 9:38 AM Pager:  [phone] After 3pm call: [phone]

## 2011-10-09 NOTE — CV Procedure (Signed)
   Cardiac Catheterization Procedure Note  Name: Alicia Guerrero MRN: [id number] DOB: 09/29/1935  Procedure: Selective Coronary Angiography  Indication: Cardiac arrest, known CAD.    Procedural Details: The right wrist was prepped, draped, and anesthetized with 1% lidocaine. Using the modified Seldinger technique, a 5 French sheath was introduced into the right radial artery. 3 mg of verapamil was administered through the sheath, weight-based unfractionated heparin was administered intravenously. Standard Judkins catheters were used for selective coronary angiography. Catheter exchanges were performed over an exchange length guidewire. There were no immediate procedural complications. A TR band was used for radial hemostasis at the completion of the procedure.  The patient was transferred to the post catheterization recovery area for further monitoring.  Procedural Findings: Hemodynamics: AO 116/59  Coronary angiography: Coronary dominance: right  Left mainstem: Short, no significant disease.   Left anterior descending (LAD):  There is a proximal LAD stent after D1 and a stent in the mid D1.  30% instent restenosis in the proximal LAD stent.  30% ostial stenosis in D1.  60-70% distal LAD stenosis.   Left circumflex (LCx): Tiny OM1 with 90% proximal stenosis.  Moderate to large OM2 with 95% ostial stenosis.  30% stenosis mid LCx after OM3.  OM3 with 30-40% proximal stenosis.   Right coronary artery (RCA): There appeared to be stents in the distal RCA and the proximal PDA.  About 30% instent restenosis was present in both.   Left ventriculography:  Not done, recent echo.   Final Conclusions:  Stents patent in RCA/PDA, LAD, and D1.  There is a 95% ostial stenosis of a moderate to large OM1.  Looking at the prior cath, it is difficult to tell whether or not this was present before.  As the patient's arrest was over a week ago, at this time I think that medical management is the best course.   Discussed with Dr. [place] who rounded on the patient this morning.    May resume coumadin tonight.   Marca Ancona 10/09/2011, 12:58 PM

## 2011-10-09 NOTE — Clinical Social Work Psychosocial (Addendum)
    Clinical Social Work Department BRIEF PSYCHOSOCIAL ASSESSMENT 10/09/2011  Patient:  Alicia Guerrero, Alicia Guerrero     Account Number:  [id number]     Admit date:  09/26/2011  Clinical Social Worker:  Hulan Fray  Date/Time:  10/09/2011 01:47 PM  Referred by:  Care Management  Date Referred:  10/09/2011 Referred for  SNF Placement   Other Referral:   Interview type:  Family Other interview type:   husband- Quin Hoop    PSYCHOSOCIAL DATA Living Status:  HUSBAND Admitted from facility:   Level of care:   Primary support name:  Fayrene Fearing Primary support relationship to patient:  SPOUSE Degree of support available:   supportive    CURRENT CONCERNS Current Concerns  Post-Acute Placement   Other Concerns:    SOCIAL WORK ASSESSMENT / PLAN Clinical Social Worker received referral for patient needing short term SNF placement. CSW called patient's son, Gerda Diss and spoke with patient's husband, Fayrene Fearing, regarding SNF process. Patient's husband has been to a previous SNF facility, but was not satisfied with it. Mr. Gilham prefers [facility] SNF for placement, but will consider [facility] as an alternative. CSW will complete FL2 for MD's signature and send clinicals to those requested facilities. CSW will update family once bed offers are made.   Assessment/plan status:  Psychosocial Support/Ongoing Assessment of Needs Other assessment/ plan:   CSW noted PT's recommendation for CIR.  Information/referral to community resources:   SNF placement    PATIENT'S/FAMILY'S RESPONSE TO PLAN OF CARE: Husband and son are agreeable to short term SNF placement. Husband is hopeful for [facility] as this facility is a closer location. Mr. Sittner was appreciative of CSW's visit and concern.

## 2011-10-10 ENCOUNTER — Inpatient Hospital Stay (HOSPITAL_COMMUNITY): Payer: Medicare Other

## 2011-10-10 DIAGNOSIS — I4901 Ventricular fibrillation: Secondary | ICD-10-CM

## 2011-10-10 DIAGNOSIS — J069 Acute upper respiratory infection, unspecified: Secondary | ICD-10-CM

## 2011-10-10 DIAGNOSIS — I219 Acute myocardial infarction, unspecified: Secondary | ICD-10-CM

## 2011-10-10 DIAGNOSIS — J9600 Acute respiratory failure, unspecified whether with hypoxia or hypercapnia: Secondary | ICD-10-CM

## 2011-10-10 LAB — GLUCOSE, CAPILLARY
Glucose-Capillary: 151 mg/dL — ABNORMAL HIGH (ref 70–99)
Glucose-Capillary: 144 mg/dL — ABNORMAL HIGH (ref 70–99)

## 2011-10-10 LAB — BASIC METABOLIC PANEL WITH GFR
BUN: 17 mg/dL (ref 6–23)
CO2: 28 meq/L (ref 19–32)
Calcium: 9.1 mg/dL (ref 8.4–10.5)
Chloride: 102 meq/L (ref 96–112)
Creatinine, Ser: 0.42 mg/dL — ABNORMAL LOW (ref 0.50–1.10)
GFR calc Af Amer: >90 mL/min
GFR calc non Af Amer: >90 mL/min
Glucose, Bld: 146 mg/dL — ABNORMAL HIGH (ref 70–99)
Potassium: 3.8 meq/L (ref 3.5–5.1)
Sodium: 138 meq/L (ref 135–145)

## 2011-10-10 LAB — CBC
HCT: 35.8 % — ABNORMAL LOW (ref 36.0–46.0)
Hemoglobin: 11.5 g/dL — ABNORMAL LOW (ref 12.0–15.0)
MCH: 26.3 pg (ref 26.0–34.0)
MCHC: 32.1 g/dL (ref 30.0–36.0)
MCV: 81.7 fL (ref 78.0–100.0)
Platelets: 510 10*3/uL — ABNORMAL HIGH (ref 150–400)
RBC: 4.38 MIL/uL (ref 3.87–5.11)
RDW: 15.4 % (ref 11.5–15.5)
WBC: 12.5 10*3/uL — ABNORMAL HIGH (ref 4.0–10.5)

## 2011-10-10 MED ORDER — FUROSEMIDE 10 MG/ML PO SOLN
20.0000 mg | Freq: Every day | ORAL | Status: DC
  Administered 2011-10-11 – 2011-10-12 (×2): 20 mg via ORAL
  Filled 2011-10-10 (×6): qty 2

## 2011-10-10 MED ORDER — ASPIRIN 81 MG PO CHEW
81.0000 mg | CHEWABLE_TABLET | Freq: Every day | ORAL | Status: DC
  Administered 2011-10-10 – 2011-10-12 (×3): 81 mg via ORAL
  Filled 2011-10-10 (×3): qty 1

## 2011-10-10 MED ORDER — PANTOPRAZOLE SODIUM 40 MG PO PACK
40.0000 mg | PACK | Freq: Every day | ORAL | Status: DC
  Administered 2011-10-11: 40 mg via ORAL
  Filled 2011-10-10 (×2): qty 20

## 2011-10-10 MED ORDER — WARFARIN SODIUM 7.5 MG PO TABS
7.5000 mg | ORAL_TABLET | Freq: Once | ORAL | Status: AC
  Administered 2011-10-10: 7.5 mg via ORAL
  Filled 2011-10-10: qty 1

## 2011-10-10 MED ORDER — BIOTENE DRY MOUTH MT LIQD
15.0000 mL | Freq: Two times a day (BID) | OROMUCOSAL | Status: DC
  Administered 2011-10-10 – 2011-10-12 (×4): 15 mL via OROMUCOSAL

## 2011-10-10 MED ORDER — FUROSEMIDE 10 MG/ML PO SOLN
20.0000 mg | Freq: Every day | ORAL | Status: DC
  Administered 2011-10-10: 20 mg
  Filled 2011-10-10 (×2): qty 2

## 2011-10-10 MED ORDER — INSULIN ASPART 100 UNIT/ML ~~LOC~~ SOLN
0.0000 [IU] | Freq: Three times a day (TID) | SUBCUTANEOUS | Status: DC
  Administered 2011-10-11: 3 [IU] via SUBCUTANEOUS

## 2011-10-10 NOTE — Progress Notes (Signed)
Name: Alicia Guerrero MRN: [id number] DOB: 05-21-1935    LOS: 14 Date of admit : 09/26/2011 PCP is Illene Regulus, MD   Referring Provider:  [facility] Reason for Referral:  Cardiac arrest  PULMONARY / CRITICAL CARE MEDICINE  Patient summary:  76 yo with extensive cardiac history brought to [facility] ED after VT/VF arrest.  Transferred to [facility] for hypothermia protocol.  Events Since Admission: 9/10  VT/VF arrest, intubated, hypothermia initiated 9/11  Hypothermia stopped as multiple arrhythmias and coagulopathy 9/19 s/p tracheostomy (DF) >>  9/23 Lt heart cath>>Stents patent in RCA/PDA, LAD, and D1.  95% ostial stenosis of a moderate to large OM1.  SUBJECTIVE: Tolerating trach collar.  Denies chest pain.  Breathing okay.  Vital Signs: Temp:  [98.4 F (36.9 C)-101.7 F (38.7 C)] 98.4 F (36.9 C) (09/24 0742) Pulse Rate:  [63-122] 77  (09/24 0900) Resp:  [16-26] 17  (09/24 0900) BP: (107-150)/(45-72) 140/60 mmHg (09/24 0900) SpO2:  [90 %-98 %] 94 % (09/24 0900) FiO2 (%):  [35 %] 35 % (09/24 0900)  Intake/Output Summary (Last 24 hours) at 10/10/11 0935 Last data filed at 10/10/11 0900  Gross per 24 hour  Intake   1950 ml  Output   2075 ml  Net   -125 ml    Physical Examination: General:  No distress Neuro: Alert, follows commands HEENT: trach site clean Cardiovascular:  RRR, no murmurs Lungs: no wheeze Abdomen:  Soft, bowel sounds present Musculoskeletal:  No edema Skin:  Intact.   Dg Chest Port 1 View  10/09/2011  *RADIOLOGY REPORT*  Clinical Data: Short of breath.  Pulmonary infiltrates.  PORTABLE CHEST - 1 VIEW  Comparison: 10/07/2011.  Findings: The patient is rotated to the right.  Left IJ central line, tracheostomy and enteric tube appear unchanged.  Pulmonary aeration appears unchanged compared yesterday's exam allowing for differences in rotation.  IMPRESSION:  1.  Stable support apparatus. 2.  No change in the pulmonary aeration and with left basilar atelectasis.   Original  Report Authenticated By: Andreas Newport, M.D.     ASSESSMENT AND PLAN  PULMONARY  Ventilator Settings: Vent Mode:  [-]  FiO2 (%):  [35 %] 35 %  ETT:  9/10>>> 9/16 ETT: 9/16 (reintubated for stridor) >> 9/19 Trach 9/19 (DF) >>   A:  Acute respiratory failure post cardiac arrest.  Mental status and stridor limitation to extubation.  Improved after trach 9/19.  Completed course of steroids 9/20 for upper airway. P:   Trach collar, speech valve as tolerated Titrate oxygen to keep SpO2 > 92% F/u CXR intermittently  CARDIOVASCULAR  Lab 10/04/11 0425  TROPONINI --  LATICACIDVEN --  PROBNP 5880.0*   TTE:  9/10 >>> EF 45%, severe hypokinesis of inferior myocardium consistent with MI. Lines: L IJ TLC 9/10 >>9/23  A: VT/VF arrest with NSTEMI and acute systolic CHF. CAD. Atrial fib.   Hyperlipidemia. Shock, likely cardiogenic>>resolved. P:  Continue lipitor, metoprolol Change ASA to 81 mg per cardiology recommendation Continue lasix to keep in negative fluid balance>>change to per tube Anticoagulation for A fib per cardiology>>d/c SQ heparin when INR therapeutic Defer decision to add ACE inhibitor to cardiology  RENAL  Lab 10/10/11 0435 10/09/11 0424 10/08/11 0430 10/07/11 0413 10/06/11 0455 10/04/11 0425  NA 138 139 137 139 141 --  K 3.8 3.6 -- -- -- --  CL 102 100 100 102 100 --  CO2 28 29 30 30  32 --  BUN 17 20 19 23  26* --  CREATININE 0.42*  0.42* 0.45* 0.52 0.55 --  CALCIUM 9.1 9.1 8.8 9.0 8.9 --  MG -- -- -- 2.1 -- 2.0  PHOS -- -- -- 2.8 -- 4.0   Intake/Output      09/23 0701 - 09/24 0700 09/24 0701 - 09/25 0700   I.V. (mL/kg) 890 (11.5) 20 (0.3)   NG/GT 1090 100   Total Intake(mL/kg) 1980 (25.7) 120 (1.6)   Urine (mL/kg/hr) 2075 (1.1)    Stool     Total Output 2075    Net -95 +120         Foley:  9/10 >>>  A: Hypokalemia. Hypophosphatemia, Hypernatremia>>resolved P:   Monitor renal fx, urine outpt F/u and replace electrolytes as needed Continue free  water at 200 ml q8h  GASTROINTESTINAL  Lab 10/04/11 0425  AST 20  ALT 36*  ALKPHOS 84  BILITOT 0.3  PROT 5.5*  ALBUMIN 2.2*   A:  Shocked liver>>resolved. Dysphagia. Nutrition. P:   Speech to follow up and assess swallowing>>for FEES 9/24; will need to remove NG tube for procedure Continue tube feeds until further assessed by speech  HEMATOLOGIC  Lab 10/10/11 0435 10/09/11 0424 10/07/11 0413 10/06/11 0455 10/05/11 0400 10/04/11 1610  HGB 11.5* 11.6* 11.2* 11.0* 10.2* --  HCT 35.8* 35.7* 34.9* 34.1* 32.2* --  PLT 510* 483* 411* 397 381 --  INR 1.27 -- -- -- -- 1.51*  APTT -- -- -- -- -- --   A:  Coagulopathy (Coumadin induced, exacerbated by hypothermia)>>resolved. Anemia of critical illness. P: F/u CBC intermittently Transfuse for Hb < 7  INFECTIOUS  Lab 10/10/11 0435 10/09/11 0424 10/07/11 0413 10/06/11 0455 10/05/11 0400  WBC 12.5* 15.6* 18.3* 21.2* 19.4*  PROCALCITON -- -- -- -- --   Cultures: 9/12  Blood >>> negative 9/12  Urine >>> E.Coli 9/12  Respiratory >>> E coli 9/12  C.diff PCR >>> neg 9/19  LLL BAL >> normal flora  Antibiotics: 9/12  Levaquin (empiric, E coli) >>> 9/21 9/12  Flagyl (empirical, anaerobes, C.diff) >>> 9/16  A:  E coli in sputum and urine>>completed course of ABx P: Monitor clinically  ENDOCRINE  Lab 10/10/11 0740 10/10/11 0507 10/09/11 2349 10/09/11 1947 10/09/11 1558  GLUCAP 127* 151* 134* 137* 101*   A:  Hyperglycemia.   Presumed relative adrenal insufficiency>> Hydrocortisone stopped 09/30/11 P:   SSI Lantus  NEUROLOGIC A:  Acute encephalopathy in setting of cardiac arrest>>improving.   Anxiety. P:   Prn haldol, fentanyl Try to avoid benzo's  BEST PRACTICES / DISPO Transfer to SDU 9/24 Full code TF >> assess for PO's Protonix Heparin subcut Will need inpt rehab  Updated husband at bedside  Coralyn Helling, MD [facility] Pulmonary/Critical Care 10/10/2011, 9:35 AM Pager:  [phone] After 3pm call:  [phone]

## 2011-10-10 NOTE — Clinical Social Work Note (Signed)
Clinical Social Worker submitted clinicals to [employer] for insurance authorization for SNF placement. CSW is awaiting OT evaluation to send to insurance company.   Rozetta Nunnery MSW, [employer] [phone]

## 2011-10-10 NOTE — Evaluation (Signed)
Speech Language Pathology Evaluation Patient Details Name: Alicia Guerrero MRN: [id number] DOB: 1935-11-15 Today's Date: 10/10/2011 Time: 6295-2841 SLP Time Calculation (min): 50 min  Problem List:  Patient Active Problem List  Diagnosis  . HYPERLIPIDEMIA, MIXED  . HYPERTENSION  . CORONARY ARTERY DISEASE  . ATRIAL FIBRILLATION  . CAROTID ARTERY STENOSIS, WITHOUT INFARCTION  . ASTHMA  . ADENOCARCINOMA, BREAST, HX OF  . HYPERTHYROIDISM, HX OF  . PERCUTANEOUS TRANSLUMINAL CORONARY ANGIOPLASTY, HX OF  . Left bundle branch block  . Cardiac arrest - ventricular fibrillation  . Acute respiratory failure  . Altered mental status  . Acute non-ST segment elevation myocardial infarction  . Hypokalemia  . Sinus bradycardia  . Hyperosmolality and/or hypernatremia  . PAF (paroxysmal atrial fibrillation)   Past Medical History:  Past Medical History  Diagnosis Date  . Left bundle branch block   . Atrial fibrillation   . Acute myocardial infarction, unspecified site, episode of care unspecified   . Coronary atherosclerosis   . Pure hypercholesterolemia   . Unspecified essential hypertension   . Cough   . Other atopic dermatitis and related conditions   . Acute upper respiratory infections of unspecified site   . Allergic rhinitis due to pollen   . Unspecified asthma   . Other and unspecified ovarian cyst   . Personal history of malignant neoplasm of breast   . Personal history of hyperthyroidism   . Chronic eczema     hands  . Hx of coronary angioplasty     percutaneous transluminal  . Hemorrhoids   . Cancer    Past Surgical History:  Past Surgical History  Procedure Date  . Oophorectomy   . Abdominal hysterectomy   . Ptca   . Cholecystectomy   . Coronary stent placement may and  july 2012  . Breast lumpectomy 08/24/2003    right lumpectomy+sln,T2N0,ERPR+,Her2-  . Polypectomy   . Colonoscopy    HPI:  76 y/o female presented to [facility] with VT arrest requiring multiple  shocks and amiodarone. Patient intubated in field on 09/26/11 to 10/05/11 .  Patient deloped stridor s/p extubation and tracheostomy completed.  Patient seen by ST on 10/06/11 for PMSV evaluation .  Patient referred for BSE to assess risk for aspiration  and recommend safest, PO diet.    Assessment / Plan / Recommendation Clinical Impression  Pt demonstrates moderate cognitive deficits consistent with post arrest anoxia. Pt has limited sustained attention which impacts storage and retrieval of basic information, orientation, awareness of deficits and safety. Pt requires max verbal cues for basic functional and verbal problem solving. Pt will benefit from acute SLP therapy to address participation in basic functional tasks. Pt would benefit from CIR at discharge. SLP will contine to follow.     SLP Assessment  Patient needs continued Speech Lanaguage Pathology Services    Follow Up Recommendations  Inpatient Rehab    Frequency and Duration min 2x/week  2 weeks   Pertinent Vitals/Pain NA   SLP Goals  SLP Goals Potential to Achieve Goals: Good Progress/Goals/Alternative treatment plan discussed with pt/caregiver and they: Agree SLP Goal #1: pt will tolerate placement of valve during all waking hours with intermittent supervision SLP Goal #1 - Progress: Progressing toward goal SLP Goal #2: Pt will phonate at conversation level with adequate intelligiblity with moderate verbal cues.  SLP Goal #2 - Progress: Progressing toward goal SLP Goal #3: Pt will demosntrate placement and removal of PMSV and verbalize prequations with min cues.  SLP Goal #  4: Pt will demonstrate emergent awareness of deficits during functional task with min contextual cues.  SLP Goal #5: Pt will complete basic verbal problem solving task with mod contextual cues.   SLP Evaluation Prior Functioning  Cognitive/Linguistic Baseline: Within functional limits Type of Home: House Lives With: Spouse Available Help at Discharge:  Family Vocation: Retired   IT consultant  Overall Cognitive Status: Impaired Arousal/Alertness: Awake/alert Orientation Level: Oriented to person;Oriented to place;Disoriented to time;Disoriented to situation Attention: Focused;Sustained;Selective Focused Attention: Appears intact Sustained Attention: Impaired Sustained Attention Impairment: Verbal complex;Functional complex Selective Attention: Impaired Selective Attention Impairment: Verbal complex;Functional complex Memory: Impaired Memory Impairment: Storage deficit;Decreased recall of new information;Decreased short term memory Awareness: Impaired Awareness Impairment: Intellectual impairment;Emergent impairment;Anticipatory impairment Problem Solving: Impaired Problem Solving Impairment: Verbal basic;Functional basic Executive Function: Reasoning;Sequencing;Organizing;Self Monitoring Reasoning: Impaired Reasoning Impairment: Verbal basic;Functional basic Sequencing: Impaired Sequencing Impairment: Verbal basic;Functional basic Organizing: Impaired Organizing Impairment: Verbal basic;Functional basic Self Monitoring: Impaired Self Monitoring Impairment: Verbal basic;Functional basic Behaviors: Restless;Impulsive Safety/Judgment: Impaired    Comprehension  Auditory Comprehension Overall Auditory Comprehension: Appears within functional limits for tasks assessed Yes/No Questions: Within Functional Limits Commands: Within Functional Limits Conversation: Simple    Expression Verbal Expression Overall Verbal Expression: Appears within functional limits for tasks assessed   Oral / Motor Oral Motor/Sensory Function Overall Oral Motor/Sensory Function: Appears within functional limits for tasks assessed Motor Speech Overall Motor Speech: Impaired Respiration: Impaired Level of Impairment: Sentence Phonation: Breathy;Hoarse;Low vocal intensity Resonance: Within functional limits Articulation: Within functional  limitis Intelligibility: Intelligibility reduced Word: 75-100% accurate Phrase: 75-100% accurate Sentence: 50-74% accurate Conversation: 25-49% accurate Motor Planning: Witnin functional limits Motor Speech Errors: Not applicable Effective Techniques: Increased vocal intensity;Pause;Pacing   GO     Desirai Traxler, [name] 10/10/2011, 10:53 AM

## 2011-10-10 NOTE — Progress Notes (Signed)
   TELEMETRY: Reviewed telemetry pt in NSR: Filed Vitals:   10/10/11 0600 10/10/11 0700 10/10/11 0712 10/10/11 0742  BP: 132/53     Pulse: 76 78 63   Temp:      TempSrc:    Oral  Resp: 21 19 18    Height:      Weight:      SpO2: 97% 97% 98%     Intake/Output Summary (Last 24 hours) at 10/10/11 0915 Last data filed at 10/10/11 0600  Gross per 24 hour  Intake   1770 ml  Output   2075 ml  Net   -305 ml    SUBJECTIVE Alert. Comfortable. Denies chest pain or SOB  LABS: Basic Metabolic Panel:  Basename 10/10/11 0435 10/09/11 0424  NA 138 139  K 3.8 3.6  CL 102 100  CO2 28 29  GLUCOSE 146* 113*  BUN 17 20  CREATININE 0.42* 0.42*  CALCIUM 9.1 9.1  MG -- --  PHOS -- --   CBC:  Basename 10/10/11 0435 10/09/11 0424  WBC 12.5* 15.6*  NEUTROABS -- --  HGB 11.5* 11.6*  HCT 35.8* 35.7*  MCV 81.7 81.3  PLT 510* 483*    Radiology/Studies:  Dg Chest Port 1 View  10/07/2011  *RADIOLOGY REPORT*  Clinical Data: Tracheostomy tube, evaluate pulmonary infiltrates and lines  PORTABLE CHEST - 1 VIEW  Comparison: 10/06/2011; 10/05/2011; 10/02/2011  Findings: Grossly unchanged enlarged cardiac silhouette and mediastinal contours without chronic calcifications in the aortic arch and descending thoracic aorta.  Stable position of support apparatus.  Minimal improved aeration of the lungs with persistent basilar heterogeneous opacities, left greater than right.  Small left-sided pleural effusion is unchanged.  No pneumothorax. Unchanged bones.  IMPRESSION: 1.  Stable positioning of support apparatus.  No pneumothorax. 2.  Improved aeration of the lungs with persistent basilar opacities, left greater than right, likely atelectasis. 3.  Unchanged small left-sided effusion.   Original Report Authenticated By: Waynard Reeds, M.D.    PHYSICAL EXAM General: Elderly, chronically ill, in no acute distress. Head: Trach Neck: Negative for carotid bruits. JVD not elevated. Lungs: Decreased BS  bilateral. Heart: RRR S1 S2 without murmurs, rubs, or gallops.  Abdomen: Soft, non-tender, non-distended with normoactive bowel sounds. No hepatomegaly. No rebound/guarding. No obvious abdominal masses. Extremities: No clubbing, cyanosis or edema.  Distal pedal and radial pulses are 2+ and equal bilaterally.   ASSESSMENT AND PLAN: 1. PEA arrest/ Vib  2. NSTEMI probably demand due to #1.  3. CAD s/p mulitple coronary stents (BMS and DES). RCA,LAD, and diagonal. There is a questionable lesion at the ostium of OM seen in one view only. I am not sure this has changed from prior studies. Recommend medical management. Continue baby ASA, metoprolol, statin.  4. LBBB  5.Acute respiratory failure. S/p trach for stridor.  6. CHF acute systolic EF 40-45% by Echo.  7. Paroxysmal atrial fibrillation. OK to resume coumadin from out standpoint.  8. Altered mental status.   Principal Problem:  *Cardiac arrest - ventricular fibrillation Active Problems:  HYPERTENSION  CORONARY ARTERY DISEASE  Acute respiratory failure  Altered mental status  Acute non-ST segment elevation myocardial infarction  Hypokalemia  Sinus bradycardia  Hyperosmolality and/or hypernatremia  PAF (paroxysmal atrial fibrillation)    Signed, Julio Zappia [place] MD,FACC 10/10/2011 9:15 AM

## 2011-10-10 NOTE — Progress Notes (Signed)
Speech Language Pathology Dysphagia Treatment [facility] Treatment- see below) Patient Details Name: Alicia Guerrero MRN: [id number] DOB: February 05, 1935 Today's Date: 10/10/2011 Time: 0955-1010 SLP Time Calculation (min): 15 min  Assessment / Plan / Recommendation Clinical Impression  SLP arrived to check pts ability to participate in objective test today to assess swallow function. Pt with strong oral mobility, swallow response. Inconclusive evidence of aspiration. Pt with wet vocal quality at baseline. Prolonged intubation puts patient at risk for silent aspiration. Will proceed with MBS given husbands reports of history of possible esophageal dysphagia PTA. Test will be completed at 1:30. RN will pull NG at 11:00 after meds are given.     Diet Recommendation  Continue with Current Diet: NPO    SLP Plan MBS   Pertinent Vitals/Pain NA   Swallowing Goals     General Temperature Spikes Noted: No Respiratory Status: Trach Behavior/Cognition: Alert;Cooperative;Pleasant mood Oral Cavity - Dentition: Missing dentition Patient Positioning: Upright in bed  Oral Cavity - Oral Hygiene Does patient have any of the following "at risk" factors?: Oxygen therapy - cannula, mask, simple oxygen devices Patient is HIGH RISK - Oral Care Protocol followed (see row info): Yes Patient is AT RISK - Oral Care Protocol followed (see row info): Yes Patient is mechanically ventilated, follow VAP prevention protocol for oral care: Oral care provided every 4 hours   Dysphagia Treatment Treatment focused on: Upgraded PO texture trials Treatment Methods/Modalities: Skilled observation Patient observed directly with PO's: Yes Type of PO's observed: Ice chips Feeding: Total assist Liquids provided via: Teaspoon Pharyngeal Phase Signs & Symptoms: Wet vocal quality   GO     Alicia Guerrero, [name] 10/10/2011, 10:43 AM       Passy-Muir Speaking Valve - Treatment Patient Details  Name: Alicia Guerrero MRN:  [id number] Date of Birth: Jul 09, 1935  Today's Date: 10/10/2011 Time: 0940-1030 SLP Time Calculation (min): 50 min  Past Medical History:  Past Medical History  Diagnosis Date  . Left bundle branch block   . Atrial fibrillation   . Acute myocardial infarction, unspecified site, episode of care unspecified   . Coronary atherosclerosis   . Pure hypercholesterolemia   . Unspecified essential hypertension   . Cough   . Other atopic dermatitis and related conditions   . Acute upper respiratory infections of unspecified site   . Allergic rhinitis due to pollen   . Unspecified asthma   . Other and unspecified ovarian cyst   . Personal history of malignant neoplasm of breast   . Personal history of hyperthyroidism   . Chronic eczema     hands  . Hx of coronary angioplasty     percutaneous transluminal  . Hemorrhoids   . Cancer    Past Surgical History:  Past Surgical History  Procedure Date  . Oophorectomy   . Abdominal hysterectomy   . Ptca   . Cholecystectomy   . Coronary stent placement may and  july 2012  . Breast lumpectomy 08/24/2003    right lumpectomy+sln,T2N0,ERPR+,Her2-  . Polypectomy   . Colonoscopy     Assessment / Plan / Recommendation Clinical Impression  PMSV in place with husband present upon arrival. RN and husband state she has been tolerating vavle well over the weekend. She is typically unintelligible without cues, but has demonstrated ability to shout when angry. Pt observed for 50 minutes with no change in vital signs, no evidence of CO2 trapping. Pts mentation precludes ability to independently follow strategies to place and remove valve independently. Husband  verbalized precaution of pt only wearing valve with full supervision. SLP provided max verbal cues for increased volume for words and phrases and breaking up sentences and conversation into short phrases with frequent deep breaths to increse volume. Pt return demonstrated in 80% of trials.     Plan   Continue with current plan of care    Follow Up Recommendations  Inpatient Rehab    Pertinent Vitals/Pain NA    SLP Goals Potential to Achieve Goals: Good Progress/Goals/Alternative treatment plan discussed with pt/caregiver and they: Agree SLP Goal #1: pt will tolerate placement of valve during all waking hours with intermittent supervision SLP Goal #1 - Progress: Progressing toward goal SLP Goal #2: Pt will phonate at conversation level with adequate intelligiblity with moderate verbal cues.  SLP Goal #2 - Progress: Progressing toward goal SLP Goal #3: Pt will demosntrate placement and removal of PMSV and verbalize prequations with min cues.    PMSV Trial  PMSV was placed for: 50 minutes Able to redirect subglottic air through upper airway: Yes Able to Attain Phonation: Yes Voice Quality: Low vocal intensity Able to Expectorate Secretions: Yes Level of Secretion Expectoration with PMSV: Oral Breath Support for Phonation: Mildly decreased Intelligibility: Intelligibility reduced Word: 75-100% accurate Phrase: 75-100% accurate Sentence: 50-74% accurate Conversation: 25-49% accurate Respirations During Trial: 19  SpO2 During Trial: 97 %   Tracheostomy Tube       Vent Dependency  Vent Dependent: No FiO2 (%): 35 %    Cuff Deflation Trial  GO     Tolerated Cuff Deflation: Yes Length of Time for Cuff Deflation Trial: baseline Behavior: Confused;Alert;Cooperative   Annette Liotta, [name] 10/10/2011, 10:38 AM

## 2011-10-10 NOTE — Progress Notes (Signed)
Physical Therapy Treatment Patient Details Name: Alicia Guerrero MRN: [id number] DOB: [birth year] Today's Date: 10/10/2011 Time: 4132-4401 PT Time Calculation (min): 37 min  PT Assessment / Plan / Recommendation Comments on Treatment Session  PAtient s/p cardiac arrest and VDRF with trach.  Patient progressing with mobility daily.      Follow Up Recommendations  Inpatient Rehab    Barriers to Discharge        Equipment Recommendations  None recommended by PT    Recommendations for Other Services Rehab consult  Frequency Min 3X/week   Plan Discharge plan remains appropriate;Frequency remains appropriate    Precautions / Restrictions Precautions Precautions: Fall Restrictions Weight Bearing Restrictions: No   Pertinent Vitals/Pain VSS, No pain    Mobility  Bed Mobility Bed Mobility: Rolling Right;Right Sidelying to Sit;Sitting - Scoot to Edge of Bed Rolling Right: 5: Supervision;With rail Rolling Left: Not tested (comment) Right Sidelying to Sit: 4: Min assist;With rails;HOB flat Sitting - Scoot to Edge of Bed: 4: Min assist Scooting to [facility]: Not tested (comment) Details for Bed Mobility Assistance: cues for technique Transfers Transfers: Sit to Stand;Stand to Sit;Stand Pivot Transfers Sit to Stand: With upper extremity assist;3: Mod assist;From bed;From elevated surface Stand to Sit: 4: Min assist;With upper extremity assist;With armrests;To chair/3-in-1 Stand Pivot Transfers: 4: Min assist;From elevated surface;With armrests Details for Transfer Assistance: Patient needed cues for hand placement.  Patient took incr time to achieve upright standing.  Patient able to take pivotal steps around to recliner with cues and facilitation at hips to get to recliner.  cues for sequencing and technique Ambulation/Gait Ambulation/Gait Assistance: Not tested (comment) Stairs: No Wheelchair Mobility Wheelchair Mobility: No    Exercises General Exercises - Lower Extremity Ankle  Circles/Pumps: AROM;Both;10 reps;Seated Long Arc Quad: AROM;Both;10 reps;Seated Hip Flexion/Marching: AROM;Both;10 reps;Seated   PT Goals Acute Rehab PT Goals PT Goal: Supine/Side to Sit - Progress: Progressing toward goal PT Goal: Sit at Edge Of Bed - Progress: Progressing toward goal PT Goal: Sit to Stand - Progress: Progressing toward goal PT Transfer Goal: Bed to Chair/Chair to Bed - Progress: Progressing toward goal  Visit Information  Last PT Received On: 10/10/11 Assistance Needed: +2    Subjective Data  Subjective: Pt. with trach, No verbalizations, shakes head to yes no questions   Cognition  Overall Cognitive Status: Impaired Area of Impairment: Following commands;Safety/judgement;Awareness of deficits;Problem solving Arousal/Alertness: Awake/alert Orientation Level: Appears intact for tasks assessed Behavior During Session: [facility] for tasks performed Following Commands: Follows one step commands inconsistently;Follows one step commands with increased time Safety/Judgement: Decreased awareness of safety precautions;Decreased safety judgement for tasks assessed;Decreased awareness of need for assistance Safety/Judgement - Other Comments: impulsive with movements    Balance  Static Sitting Balance Static Sitting - Balance Support: Feet supported;No upper extremity supported Static Sitting - Level of Assistance: 5: Stand by assistance Static Sitting - Comment/# of Minutes: 2 minutes.    End of Session PT - End of Session Equipment Utilized During Treatment: Gait belt;Oxygen Activity Tolerance: Patient tolerated treatment well Patient left: in chair;with call bell/phone within reach;with family/visitor present Nurse Communication: Mobility status       INGOLD,Jarett Dralle 10/10/2011, 2:17 PM  [facility] Acute Rehabilitation [phone] (pager)

## 2011-10-10 NOTE — Clinical Social Work Placement (Addendum)
    Clinical Social Work Department CLINICAL SOCIAL WORK PLACEMENT NOTE 10/10/2011  Patient:  TENELLE, ANDREASON  Account Number:  [id number] Admit date:  09/26/2011  Clinical Social Worker:  Rozetta Nunnery, Theresia Majors  Date/time:  10/10/2011 03:27 PM  Clinical Social Work is seeking post-discharge placement for this patient at the following level of care:   SKILLED NURSING   (*CSW will update this form in Epic as items are completed)   10/09/2011  Patient/family provided with [facility] Department of Clinical Social Work's list of facilities offering this level of care within the geographic area requested by the patient (or if unable, by the patient's family).  10/09/2011  Patient/family informed of their freedom to choose among providers that offer the needed level of care, that participate in Medicare, Medicaid or managed care program needed by the patient, have an available bed and are willing to accept the patient.  10/09/2011  Patient/family informed of [facility]' ownership interest in [facility], as well as of the fact that they are under no obligation to receive care at this facility.  PASARR submitted to EDS on 10/10/2011 PASARR number received from EDS on   FL2 transmitted to all facilities in geographic area requested by pt/family on  10/09/2011 FL2 transmitted to all facilities within larger geographic area on   Patient informed that his/her managed care company has contracts with or will negotiate with  certain facilities, including the following:     Patient/family informed of bed offers received:  10-11-11  Patient chooses bed at [facility] Physician recommends and patient chooses bed at  none  Patient to be transferred to  on  10/12/2011 Patient to be transferred to facility by [facility]  The following physician request were entered in Epic: Vickii Penna, LCSWA [phone]  Clinical Social Work   Additional Comments:

## 2011-10-10 NOTE — Progress Notes (Signed)
ANTICOAGULATION CONSULT NOTE - F/U Pharmacy Consult for Coumadin Indication: atrial fibrillation  Allergies  Allergen Reactions  . Meperidine Hcl Other (See Comments)    Mixed with phenergan swelling of the mouth   . Penicillins Other (See Comments)    Unknown reaction  . Molds & Smuts Other (See Comments)    Runny nose  . Tape Other (See Comments)    Redness, pulls skin off    Patient Measurements: Height: 5' 3.5" (161.3 cm) Weight: 169 lb 15.6 oz (77.1 kg) IBW/kg (Calculated) : 53.55    Vital Signs: Temp: 99.1 F (37.3 C) (09/24 0300) Temp src: Oral (09/24 0742) BP: 132/53 mmHg (09/24 0600) Pulse Rate: 63  (09/24 0712)  Labs:  Basename 10/10/11 0435 10/09/11 0424 10/08/11 0430  HGB 11.5* 11.6* --  HCT 35.8* 35.7* --  PLT 510* 483* --  APTT -- -- --  LABPROT 15.6* -- --  INR 1.27 -- --  HEPARINUNFRC -- -- --  CREATININE 0.42* 0.42* 0.45*  CKTOTAL -- -- --  CKMB -- -- --  TROPONINI -- -- --    Estimated Creatinine Clearance: 61.4 ml/min (by C-G formula based on Cr of 0.42).   Medical History: Past Medical History  Diagnosis Date  . Left bundle branch block   . Atrial fibrillation   . Acute myocardial infarction, unspecified site, episode of care unspecified   . Coronary atherosclerosis   . Pure hypercholesterolemia   . Unspecified essential hypertension   . Cough   . Other atopic dermatitis and related conditions   . Acute upper respiratory infections of unspecified site   . Allergic rhinitis due to pollen   . Unspecified asthma   . Other and unspecified ovarian cyst   . Personal history of malignant neoplasm of breast   . Personal history of hyperthyroidism   . Chronic eczema     hands  . Hx of coronary angioplasty     percutaneous transluminal  . Hemorrhoids   . Cancer     Medications:  Prescriptions prior to admission  Medication Sig Dispense Refill  . amLODipine (NORVASC) 10 MG tablet Take 10 mg by mouth daily.      Marland Kitchen aspirin EC 81 MG  tablet Take 81 mg by mouth every evening.       Marland Kitchen atorvastatin (LIPITOR) 40 MG tablet Take 40 mg by mouth daily.      . furosemide (LASIX) 40 MG tablet Take 40 mg by mouth daily.      . Multiple Vitamin (MULTIVITAMIN WITH MINERALS) TABS Take 1 tablet by mouth daily.      . nitroGLYCERIN (NITROSTAT) 0.4 MG SL tablet Place 0.4 mg under the tongue every 5 (five) minutes x 3 doses as needed. Chest pain      . OVER THE COUNTER MEDICATION Take 1 tablet by mouth daily. Calcium, Magnesium, Zinc, with D3      . OVER THE COUNTER MEDICATION Apply 1 application topically 4 (four) times daily as needed. For pain to joints  Actvon      . potassium chloride SA (K-DUR,KLOR-CON) 20 MEQ tablet Take 20 mEq by mouth daily.      . trandolapril ([name]) 4 MG tablet Take 4 mg by mouth daily.      Marland Kitchen warfarin (COUMADIN) 5 MG tablet Take 5 mg by mouth daily.        Assessment: 76 yo female s/p cath with PAF on coumadin PTA (home regimen is 5mg /day). Last INR was 1.51 on 10/04/11. INR today  is down to 1.27 after Coumadin resumed last night. Patient was on heparin 9/19 but this was discontinued for blood-tinged secretions from trach. Patient noted on sq heparin q8hr. CBC stable. No note of recent bleed.   Goal of Therapy:  INR 2-3 Monitor platelets by anticoagulation protocol: Yes   Plan:  -Coumadin 7.5mg  po today -Daily INR  [employer], Pharm.D. Clinical Pharmacist   Pager: [phone] 10/10/2011 7:58 AM

## 2011-10-10 NOTE — Procedures (Signed)
Objective Swallowing Evaluation: Modified Barium Swallowing Study  Patient Details  Name: Alicia Guerrero MRN: [id number] Date of Birth: Jan 13, 1936  Today's Date: 10/10/2011 Time: 1350-1420 SLP Time Calculation (min): 30 min  Past Medical History:  Past Medical History  Diagnosis Date  . Left bundle branch block   . Atrial fibrillation   . Acute myocardial infarction, unspecified site, episode of care unspecified   . Coronary atherosclerosis   . Pure hypercholesterolemia   . Unspecified essential hypertension   . Cough   . Other atopic dermatitis and related conditions   . Acute upper respiratory infections of unspecified site   . Allergic rhinitis due to pollen   . Unspecified asthma   . Other and unspecified ovarian cyst   . Personal history of malignant neoplasm of breast   . Personal history of hyperthyroidism   . Chronic eczema     hands  . Hx of coronary angioplasty     percutaneous transluminal  . Hemorrhoids   . Cancer    Past Surgical History:  Past Surgical History  Procedure Date  . Oophorectomy   . Abdominal hysterectomy   . Ptca   . Cholecystectomy   . Coronary stent placement may and  july 2012  . Breast lumpectomy 08/24/2003    right lumpectomy+sln,T2N0,ERPR+,Her2-  . Polypectomy   . Colonoscopy    HPI:  76 y/o female presented to [facility] with VT arrest requiring multiple shocks and amiodarone. Patient intubated in field on 09/26/11 to 10/05/11 .  Patient deloped stridor s/p extubation and tracheostomy completed.  Patient seen by ST on 10/06/11 for PMSV evaluation .  Patient referred for BSE to assess risk for aspiration  and recommend safest, PO diet.      Assessment / Plan / Recommendation Clinical Impression  Dysphagia Diagnosis: Mild oral phase dysphagia Clinical impression: Pt presetnw tih a mild oral phase dysphagia with mild oral residuals that spill to valleculae post swallow and pool in the anterior oral sulcus. Pt typically clears pharyngeal residuals  with a second swallow, but mild oral residual sremain. recommend supervision and cueing to clear oral cavity after bites and sips with a second or third swallow if needed. Pahryngeal function is adequate. Pt may initate a Dys 3 (mechanical soft) diet with thin liquids. recommend pills be given whole in puree. Pt must wear PMSV with all POs. SLP will f/u for tolerance.     Treatment Recommendation  Therapy as outlined in treatment plan below    Diet Recommendation Dysphagia 3 (Mechanical Soft);Thin liquid   Liquid Administration via: Cup;Straw Medication Administration: Whole meds with puree Supervision: Patient able to self feed;Full supervision/cueing for compensatory strategies Compensations: Slow rate;Small sips/bites;Multiple dry swallows after each bite/sip Postural Changes and/or Swallow Maneuvers: Seated upright 90 degrees    Other  Recommendations Oral Care Recommendations: Oral care BID Other Recommendations: Place PMSV during PO intake   Follow Up Recommendations  Inpatient Rehab    Frequency and Duration min 2x/week  2 weeks   Pertinent Vitals/Pain NA    SLP Swallow Goals Patient will consume recommended diet without observed clinical signs of aspiration with: Modified independent assistance Patient will utilize recommended strategies during swallow to increase swallowing safety with: Modified independent assistance   General HPI: 76 y/o female presented to [facility] with VT arrest requiring multiple shocks and amiodarone. Patient intubated in field on 09/26/11 to 10/05/11 .  Patient deloped stridor s/p extubation and tracheostomy completed.  Patient seen by ST on 10/06/11 for PMSV  evaluation .  Patient referred for BSE to assess risk for aspiration  and recommend safest, PO diet.  Type of Study: Modified Barium Swallowing Study Reason for Referral: Objectively evaluate swallowing function Diet Prior to this Study: NPO Respiratory Status: Trach Trach Size and Type: Cuff;#6;With  PMSV in place History of Recent Intubation: Yes Length of Intubations (days): 9 days Date extubated: 10/05/11 Behavior/Cognition: Alert;Cooperative;Pleasant mood Oral Cavity - Dentition: Missing dentition Oral Motor / Sensory Function: Within functional limits Self-Feeding Abilities: Able to feed self Patient Positioning: Upright in chair Baseline Vocal Quality: Hoarse;Breathy;Low vocal intensity Volitional Cough: Strong Volitional Swallow: Able to elicit Anatomy: Other (Comment) (Appearance of osteophyte at C5/6)    Reason for Referral Objectively evaluate swallowing function   Oral Phase Oral Preparation/Oral Phase Oral Phase: Impaired Oral - Thin Oral - Thin Cup: Lingual/palatal residue;Pocketing in anterior sulcus Oral - Thin Straw: Lingual/palatal residue;Pocketing in anterior sulcus Oral - Solids Oral - Puree: Within functional limits Oral - Mechanical Soft: Reduced posterior propulsion;Lingual/palatal residue;Delayed oral transit   Pharyngeal Phase Pharyngeal Phase Pharyngeal Phase: Within functional limits  Cervical Esophageal Phase    GO   Harlon Ditty, MA CCC-SLP [phone]  Cervical Esophageal Phase Cervical Esophageal Phase: [facility]         Maelie Chriswell, [name] 10/10/2011, 2:49 PM

## 2011-10-11 LAB — PROTIME-INR
INR: 1.3 (ref 0.00–1.49)
Prothrombin Time: 15.9 seconds — ABNORMAL HIGH (ref 11.6–15.2)

## 2011-10-11 LAB — BASIC METABOLIC PANEL
CO2: 30 mEq/L (ref 19–32)
Calcium: 9.3 mg/dL (ref 8.4–10.5)
GFR calc Af Amer: >90 mL/min (ref >90)
GFR calc non Af Amer: 88 mL/min — ABNORMAL LOW (ref >90)
Sodium: 141 mEq/L (ref 135–145)

## 2011-10-11 LAB — GLUCOSE, CAPILLARY: Glucose-Capillary: 103 mg/dL — ABNORMAL HIGH (ref 70–99)

## 2011-10-11 MED ORDER — WARFARIN SODIUM 10 MG PO TABS
10.0000 mg | ORAL_TABLET | Freq: Once | ORAL | Status: AC
  Administered 2011-10-11: 10 mg via ORAL
  Filled 2011-10-11 (×2): qty 1

## 2011-10-11 NOTE — Progress Notes (Signed)
   No chest pain or significant dyspnea.   TELEMETRY: Reviewed telemetry pt in NSR: Filed Vitals:   10/11/11 0900 10/11/11 1000 10/11/11 1100 10/11/11 1230  BP: 121/49 137/75 138/59 111/53  Pulse: 81 75 76 74  Temp:    98.1 F (36.7 C)  TempSrc:    Oral  Resp: 18 23 23 19   Height:      Weight:      SpO2: 95% 95% 95% 93%    Intake/Output Summary (Last 24 hours) at 10/11/11 1313 Last data filed at 10/11/11 0900  Gross per 24 hour  Intake    360 ml  Output   1040 ml  Net   -680 ml    SUBJECTIVE Alert. Comfortable. Denies chest pain or SOB  LABS: Basic Metabolic Panel:  Basename 10/11/11 0430 10/10/11 0435  NA 141 138  K 4.2 3.8  CL 103 102  CO2 30 28  GLUCOSE 120* 146*  BUN 13 17  CREATININE 0.59 0.42*  CALCIUM 9.3 9.1  MG -- --  PHOS -- --   CBC:  Basename 10/10/11 0435 10/09/11 0424  WBC 12.5* 15.6*  NEUTROABS -- --  HGB 11.5* 11.6*  HCT 35.8* 35.7*  MCV 81.7 81.3  PLT 510* 483*    Radiology/Studies:  Dg Chest Port 1 View  10/07/2011  *RADIOLOGY REPORT*  Clinical Data: Tracheostomy tube, evaluate pulmonary infiltrates and lines  PORTABLE CHEST - 1 VIEW  Comparison: 10/06/2011; 10/05/2011; 10/02/2011  Findings: Grossly unchanged enlarged cardiac silhouette and mediastinal contours without chronic calcifications in the aortic arch and descending thoracic aorta.  Stable position of support apparatus.  Minimal improved aeration of the lungs with persistent basilar heterogeneous opacities, left greater than right.  Small left-sided pleural effusion is unchanged.  No pneumothorax. Unchanged bones.  IMPRESSION: 1.  Stable positioning of support apparatus.  No pneumothorax. 2.  Improved aeration of the lungs with persistent basilar opacities, left greater than right, likely atelectasis. 3.  Unchanged small left-sided effusion.   Original Report Authenticated By: Waynard Reeds, M.D.    PHYSICAL EXAM General: Elderly, chronically ill, in no acute distress. Head:  Trach Neck: Negative for carotid bruits. JVD not elevated. Lungs: Decreased BS bilateral. Heart: RRR S1 S2 without murmurs, rubs, or gallops.  Abdomen: Soft, non-tender, non-distended with normoactive bowel sounds. No hepatomegaly. No rebound/guarding. No obvious abdominal masses. Extremities: No clubbing, cyanosis or edema.  Distal pedal and radial pulses are 2+ and equal bilaterally.   ASSESSMENT AND PLAN: 1. PEA arrest/ Vib  2. NSTEMI probably demand due to #1.  3. CAD s/p mulitple coronary stents (BMS and DES). RCA,LAD, and diagonal.  Recommend medical management. Continue baby ASA, metoprolol, statin.  4. LBBB  5.Acute respiratory failure. S/p trach for stridor.  6. CHF acute systolic EF 40-45% by Echo.  7. Paroxysmal atrial fibrillation. OK to resume coumadin from our standpoint.  8. Altered mental status.   Eliane Decree MD,FACC 10/11/2011 1:13 PM

## 2011-10-11 NOTE — Progress Notes (Addendum)
Passy-Muir Speaking Valve - Treatment  (Dysphagia and Cognitive treatment - see below) Patient Details  Name: Alicia Guerrero MRN: [id number] Date of Birth: 19-Jun-1935  Today's Date: 10/11/2011 Time: 0822-0912 SLP Time Calculation (min): 50 min    Assessment / Plan / Recommendation Clinical Impression  Treatment focused on tolerance of PMSV valve during PO intake. Pt with much improved vocal quality today, volume appropriate, fully intelligible. Pt With questionable intermittent episode of CO2 trapping and frequent coughing. RR rate also higher than typical baseline. RN reports pt had to have significant suctioning this am and secretions were copious. RN reports that pt expectorates secretions well to mouth when PMSV is in place. Unfortunately, pt can only wear PMSV will full supervision. If pt had a cuffless trach, SLP would feel more comfortable placing valve during all waking hours, improving ability to manage secretions independently. Recommend pt be changed to a cuffless size 6 shiley. SLP will continue to follow.     Plan  Continue with current plan of care    Follow Up Recommendations  Inpatient Rehab    Pertinent Vitals/Pain NA    SLP Goals SLP Goal #1: pt will tolerate placement of valve during all waking hours with intermittent supervision SLP Goal #1 - Progress: Progressing toward goal SLP Goal #2: Pt will phonate at conversation level with adequate intelligiblity with moderate verbal cues.  SLP Goal #2 - Progress: Progressing toward goal SLP Goal #3: Pt will demosntrate placement and removal of PMSV and verbalize prequations with min cues.    PMSV Trial  PMSV was placed for: 50 minutes Able to redirect subglottic air through upper airway: Yes Able to Attain Phonation: Yes Voice Quality: Normal Able to Expectorate Secretions: Yes Level of Secretion Expectoration with PMSV: Oral Breath Support for Phonation: Adequate Word: 75-100% accurate Phrase: 75-100%  accurate Sentence: 75-100% accurate Conversation: 75-100% accurate Respirations During Trial: 25  SpO2 During Trial: 94 %   Tracheostomy Tube  Additional Tracheostomy Tube Assessment Trach Collar Period: 24 hours a day Secretion Description: white, copious Frequency of Tracheal Suctioning: frequent Level of Secretion Expectoration: Tracheal;Oral    Vent Dependency  Vent Dependent: No FiO2 (%): 28 %    Cuff Deflation Trial  GO     Tolerated Cuff Deflation: Yes Length of Time for Cuff Deflation Trial: baseline Behavior: Confused;Alert;Cooperative   Tore Carreker, [name] 10/11/2011, 9:32 AM   Speech Language Pathology Dysphagia Treatment Patient Details Name: Alicia Guerrero MRN: [id number] DOB: 25-Feb-1935 Today's Date: 10/11/2011 Time: 0822-0912 SLP Time Calculation (min): 50 min  Assessment / Plan / Recommendation Clinical Impression  Pt demonstrating consitent cough with all liquid consistencies today, in contrast with results of MBS. Yesterday pt with one episode of sensed aspiration when liquids given with pill. Feel dysphagia is primarily oral, due to oral residuals falling to airway post swallow. With cues to clear oral cavity and swallow again, coughing reduced. Pt also noted to have some pocketing in left buccal cavity, requiring cues to clear. Though pt does appear to have some aspiration/penetration events, sensation and cough strength are adequate to clear airway. Recommend pt continue current diet with new strategies and full supervision.     Diet Recommendation  Continue with Current Diet: Dysphagia 3 (mechanical soft);Thin liquid    SLP Plan Continue with current plan of care   Pertinent Vitals/Pain NA   Swallowing Goals  SLP Swallowing Goals Patient will consume recommended diet without observed clinical signs of aspiration with: Modified independent assistance Swallow Study Goal #1 -  Progress: Progressing toward goal Patient will utilize recommended  strategies during swallow to increase swallowing safety with: Modified independent assistance Swallow Study Goal #2 - Progress: Progressing toward goal  General Temperature Spikes Noted: No Respiratory Status: Trach Behavior/Cognition: Alert;Cooperative;Pleasant mood;Confused Oral Cavity - Dentition: Missing dentition Patient Positioning: Upright in bed  Oral Cavity - Oral Hygiene Does patient have any of the following "at risk" factors?: Oxygen therapy - cannula, mask, simple oxygen devices Patient is AT RISK - Oral Care Protocol followed (see row info): Yes   Dysphagia Treatment Treatment focused on: Skilled observation of diet tolerance;Patient/family/caregiver education;Utilization of compensatory strategies;Facilitation of oral preparatory phase;Facilitation of oral phase;Facilitation of pharyngeal phase Family/Caregiver Educated: husband Treatment Methods/Modalities: Skilled observation Patient observed directly with PO's: Yes Type of PO's observed: Dysphagia 3 (soft);Thin liquids Feeding: Needs assist Liquids provided via: Straw;Cup Oral Phase Signs & Symptoms: Left pocketing Pharyngeal Phase Signs & Symptoms: Immediate cough;Watery eyes Type of cueing: Verbal;Visual Amount of cueing: Moderate    Speech Language Pathology Treatment Patient Details Name: Alicia Guerrero MRN: [id number] DOB: [birth year] Today's Date: 10/11/2011 Time: 0822-0912 SLP Time Calculation (min): 50 min  Assessment / Plan / Recommendation Clinical Impression  Cognitive treatment focused on cognition, moderate verbal and contextual cues provided for basic functional problem solving emergent awareness of impulsivity with verbal and functional tasks. Pt improving.     SLP Plan  Continue with current plan of care    Pertinent Vitals/Pain NA  SLP Goals  SLP Goals Potential to Achieve Goals: Good  SLP Goal #4: Pt will demonstrate emergent awareness of deficits during functional task with min  contextual cues.  SLP Goal #4 - Progress: Progressing toward goal SLP Goal #5: Pt will complete basic verbal problem solving task with mod contextual cues.  SLP Goal #5 - Progress: Progressing toward goal      [facility], MA CCC-SLP [phone]  [place], [place] 10/11/2011, 9:22 AM

## 2011-10-11 NOTE — Progress Notes (Signed)
ANTICOAGULATION CONSULT NOTE - F/U  Pharmacy Consult for Coumadin Indication: atrial fibrillation  Allergies  Allergen Reactions  . Meperidine Hcl Other (See Comments)    Mixed with phenergan swelling of the mouth   . Penicillins Other (See Comments)    Unknown reaction  . Molds & Smuts Other (See Comments)    Runny nose  . Tape Other (See Comments)    Redness, pulls skin off    Patient Measurements: Height: 5' 3.5" (161.3 cm) Weight: 169 lb 15.6 oz (77.1 kg) IBW/kg (Calculated) : 53.55    Vital Signs: Temp: 98.8 F (37.1 C) (09/25 0700) Temp src: Oral (09/25 0700) BP: 137/60 mmHg (09/25 0700) Pulse Rate: 80  (09/25 0758)  Labs:  Basename 10/11/11 0430 10/10/11 0435 10/09/11 0424  HGB -- 11.5* 11.6*  HCT -- 35.8* 35.7*  PLT -- 510* 483*  APTT -- -- --  LABPROT 15.9* 15.6* --  INR 1.30 1.27 --  HEPARINUNFRC -- -- --  CREATININE 0.59 0.42* 0.42*  CKTOTAL -- -- --  CKMB -- -- --  TROPONINI -- -- --    Estimated Creatinine Clearance: 61.4 ml/min (by C-G formula based on Cr of 0.59).   Medical History: Past Medical History  Diagnosis Date  . Left bundle branch block   . Atrial fibrillation   . Acute myocardial infarction, unspecified site, episode of care unspecified   . Coronary atherosclerosis   . Pure hypercholesterolemia   . Unspecified essential hypertension   . Cough   . Other atopic dermatitis and related conditions   . Acute upper respiratory infections of unspecified site   . Allergic rhinitis due to pollen   . Unspecified asthma   . Other and unspecified ovarian cyst   . Personal history of malignant neoplasm of breast   . Personal history of hyperthyroidism   . Chronic eczema     hands  . Hx of coronary angioplasty     percutaneous transluminal  . Hemorrhoids   . Cancer     Medications:  Prescriptions prior to admission  Medication Sig Dispense Refill  . amLODipine (NORVASC) 10 MG tablet Take 10 mg by mouth daily.      Marland Kitchen aspirin EC  81 MG tablet Take 81 mg by mouth every evening.       Marland Kitchen atorvastatin (LIPITOR) 40 MG tablet Take 40 mg by mouth daily.      . furosemide (LASIX) 40 MG tablet Take 40 mg by mouth daily.      . Multiple Vitamin (MULTIVITAMIN WITH MINERALS) TABS Take 1 tablet by mouth daily.      . nitroGLYCERIN (NITROSTAT) 0.4 MG SL tablet Place 0.4 mg under the tongue every 5 (five) minutes x 3 doses as needed. Chest pain      . OVER THE COUNTER MEDICATION Take 1 tablet by mouth daily. Calcium, Magnesium, Zinc, with D3      . OVER THE COUNTER MEDICATION Apply 1 application topically 4 (four) times daily as needed. For pain to joints  Actvon      . potassium chloride SA (K-DUR,KLOR-CON) 20 MEQ tablet Take 20 mEq by mouth daily.      . trandolapril ([name]) 4 MG tablet Take 4 mg by mouth daily.      Marland Kitchen warfarin (COUMADIN) 5 MG tablet Take 5 mg by mouth daily.        Assessment: 76 yo female admitted 9/10 s/p cath with PAF on coumadin PTA (home regimen is 5mg /day).Coumadin resumed on 9/23. INR today  only slightly increased to 1.30<1.27 but still subtherapeutic even after patient receiving higher doses than her normal home dose. Since Coumadin was on hold for quite some time after she was admitted, INR will take a few days to reach therapeutic goal. Patient was on heparin 9/19 but this was discontinued for blood-tinged secretions from trach. Patient noted on sq heparin q8hr for DVT prophlyaxis. CBC stable. No note of recent bleed.   Goal of Therapy:  INR 2-3 Monitor platelets by anticoagulation protocol: Yes   Plan:  -Coumadin 10mg  po x 1 dose tonight  -INR in AM   Thank you,  [employer], Pharm.D. Clinical Pharmacist   Pager: [phone] 10/11/2011 8:29 AM

## 2011-10-11 NOTE — Evaluation (Signed)
Occupational Therapy Evaluation Patient Details Name: Alicia Guerrero MRN: [id number] DOB: Jan 22, 1935 Today's Date: 10/11/2011 Time: 1120-1212 OT Time Calculation (min): 52 min  OT Assessment / Plan / Recommendation Clinical Impression  Pt admitted with cardiac arrest, VDRF.  She is now on trach collar with 02.  Pt presents with deconditioning, moderate dependence in self care, and cognitive deficits.  Will follow to address these areas to facilitate return home with husband.  Recommend inpatient rehab referral.    OT Assessment  Patient needs continued OT Services    Follow Up Recommendations  Inpatient Rehab    Barriers to Discharge      Equipment Recommendations  None recommended by OT    Recommendations for Other Services    Frequency  Min 2X/week    Precautions / Restrictions Precautions Precautions: Fall Precaution Comments: swallowing Restrictions Weight Bearing Restrictions: No   Pertinent Vitals/Pain     ADL  Eating/Feeding: Performed;Set up Where Assessed - Eating/Feeding: Chair Grooming: Performed;Wash/dry face;Set up Where Assessed - Grooming: Unsupported sitting Upper Body Bathing: Simulated;Moderate assistance Where Assessed - Upper Body Bathing: Unsupported sitting Lower Body Bathing: Moderate assistance Where Assessed - Lower Body Bathing: Supported sit to stand Upper Body Dressing: Performed;Minimal assistance Where Assessed - Upper Body Dressing: Unsupported sitting Lower Body Dressing: Simulated;Moderate assistance Where Assessed - Lower Body Dressing: Supported sit to stand Equipment Used: Rolling walker;Gait belt;Other (comment) (02 on 28% via trach) Transfers/Ambulation Related to ADLs: Pt ambulated with RW and 02 with chair following with min assist.  Verbal cues to stay inside walker and to guide walker.    OT Diagnosis: Generalized weakness;Cognitive deficits  OT Problem List: Decreased activity tolerance;Impaired balance (sitting and/or  standing);Decreased cognition;Decreased safety awareness;Decreased knowledge of use of DME or AE;Cardiopulmonary status limiting activity OT Treatment Interventions: Self-care/ADL training;DME and/or AE instruction;Therapeutic activities;Energy conservation;Balance training;Patient/family education;Cognitive remediation/compensation   OT Goals Acute Rehab OT Goals OT Goal Formulation: With patient Time For Goal Achievement: 10/25/11 Potential to Achieve Goals: Good ADL Goals Pt Will Perform Grooming: with supervision;Standing at sink ADL Goal: Grooming - Progress: Goal set today Pt Will Perform Upper Body Bathing: with supervision;Sitting, edge of bed ADL Goal: Upper Body Bathing - Progress: Goal set today Pt Will Perform Lower Body Bathing: with supervision;Sit to stand from bed ADL Goal: Lower Body Bathing - Progress: Goal set today Pt Will Perform Upper Body Dressing: with set-up;Sitting, bed ADL Goal: Upper Body Dressing - Progress: Goal set today Pt Will Perform Lower Body Dressing: with supervision;Sit to stand from bed ADL Goal: Lower Body Dressing - Progress: Goal set today Pt Will Transfer to Toilet: with supervision;with DME;Ambulation;3-in-1 ADL Goal: Toilet Transfer - Progress: Goal set today Pt Will Perform Toileting - Clothing Manipulation: with supervision;Standing ADL Goal: Toileting - Clothing Manipulation - Progress: Goal set today Pt Will Perform Toileting - Hygiene: with supervision;Leaning right and/or left on 3-in-1/toilet ADL Goal: Toileting - Hygiene - Progress: Goal set today Pt Will Perform Tub/Shower Transfer: Shower transfer;with set-up;Ambulation;with DME ADL Goal: Tub/Shower Transfer - Progress: Goal set today Miscellaneous OT Goals Miscellaneous OT Goal #1: Pt will correctly answer orientation questions using environmental cues. OT Goal: Miscellaneous Goal #1 - Progress: Goal set today Miscellaneous OT Goal #2: Pt will follow two step directions with 80%  accuracy. OT Goal: Miscellaneous Goal #2 - Progress: Goal set today  Visit Information  Last OT Received On: 10/11/11 Assistance Needed: +2 PT/OT Co-Evaluation/Treatment: Yes    Subjective Data  Subjective: "Nobody comes to visit me." Patient Stated Goal:  Did not state.   Prior Functioning     Home Living Lives With: Spouse Available Help at Discharge: Family Type of Home: House Home Access: Ramped entrance Home Layout: One level Bathroom Shower/Tub: Health visitor: Standard Home Adaptive Equipment: Bedside commode/3-in-1;Shower chair without back;Walker - rolling;Straight cane Prior Function Level of Independence: Independent Able to Take Stairs?: Yes Driving: Yes Vocation: Retired Musician: Teacher, adult education Dominant Hand: Right         Vision/Perception     Cognition  Overall Cognitive Status: Impaired Area of Impairment: Following commands;Safety/judgement;Awareness of deficits;Problem solving Arousal/Alertness: Awake/alert Orientation Level: Disoriented to;Place;Situation Behavior During Session: [facility] for tasks performed Following Commands: Follows one step commands inconsistently Safety/Judgement: Decreased awareness of safety precautions;Decreased safety judgement for tasks assessed;Decreased awareness of need for assistance;Impulsive    Extremity/Trunk Assessment Right Upper Extremity Assessment RUE ROM/Strength/Tone: [facility] for tasks assessed Left Upper Extremity Assessment LUE ROM/Strength/Tone: WFL for tasks assessed Trunk Assessment Trunk Assessment: Normal     Mobility Bed Mobility Bed Mobility: Rolling Right;Right Sidelying to Sit;Sitting - Scoot to Edge of Bed Rolling Right: 4: Min guard;With rail Right Sidelying to Sit: 4: Min guard;With rails Sitting - Scoot to Edge of Bed: 4: Min guard Transfers Transfers: Sit to Stand;Stand to Sit Sit to Stand: 3: Mod assist;With upper extremity assist;From bed Stand to  Sit: 4: Min assist;With upper extremity assist;With armrests;To chair/3-in-1 Details for Transfer Assistance: verbal cues for hand placement     Shoulder Instructions     Exercise     Balance Balance Balance Assessed: Yes Static Sitting Balance Static Sitting - Balance Support: Feet supported;No upper extremity supported Static Sitting - Level of Assistance: 5: Stand by assistance Static Sitting - Comment/# of Minutes: 10   End of Session OT - End of Session Equipment Utilized During Treatment: Gait belt Activity Tolerance: Patient tolerated treatment well Patient left: in chair;with call bell/phone within reach Nurse Communication: Mobility status  GO     Evern Bio 10/11/2011, 1:48 PM [phone]

## 2011-10-11 NOTE — Progress Notes (Signed)
Name: Alicia Guerrero MRN: [id number] DOB: 1935/08/19    LOS: 15 Date of admit : 09/26/2011 PCP is Illene Regulus, MD   Referring Provider:  [facility] Reason for Referral:  Cardiac arrest  PULMONARY / CRITICAL CARE MEDICINE  Patient summary:  76 yo with extensive cardiac history brought to [facility] after VT/VF arrest.  Transferred to [facility] for hypothermia protocol.  Events Since Admission: 9/10  VT/VF arrest, intubated, hypothermia initiated 9/11  Hypothermia stopped as multiple arrhythmias and coagulopathy 9/19 s/p tracheostomy (DF) >>  9/23 Lt heart cath>>Stents patent in RCA/PDA, LAD, and D1.  95% ostial stenosis of a moderate to large OM1. 9/24 FEES>>D3 diet, thin liquids  SUBJECTIVE: Tolerating trach collar.  Denies chest pain.  Breathing okay.  Fitted for speech valve.  Vital Signs: Temp:  [98.3 F (36.8 C)-99.1 F (37.3 C)] 98.8 F (37.1 C) (09/25 0700) Pulse Rate:  [64-100] 80  (09/25 0758) Resp:  [20-32] 32  (09/25 0758) BP: (94-142)/(43-75) 137/60 mmHg (09/25 0700) SpO2:  [92 %-99 %] 93 % (09/25 0758) FiO2 (%):  [28 %-35 %] 28 % (09/25 0758)  Intake/Output Summary (Last 24 hours) at 10/11/11 1041 Last data filed at 10/11/11 0700  Gross per 24 hour  Intake    210 ml  Output   1130 ml  Net   -920 ml    Physical Examination: General:  No distress Neuro: Alert, follows commands HEENT: trach site clean Cardiovascular:  RRR, no murmurs Lungs: no wheeze Abdomen:  Soft, bowel sounds present Musculoskeletal:  No edema Skin:  Intact.   Dg Swallowing Func-speech Pathology  10/10/2011  CLINICAL DATA: objectively evaluate swallow function   FLUOROSCOPY FOR SWALLOWING FUNCTION STUDY:  Fluoroscopy was provided for swallowing function study, which was  administered by a speech pathologist.  Final results and recommendations  from this study are contained within the speech pathology report.      ASSESSMENT AND PLAN  PULMONARY  Ventilator Settings: Vent Mode:  [-]  FiO2  (%):  [28 %-35 %] 28 %  ETT:  9/10>>> 9/16 ETT: 9/16 (reintubated for stridor) >> 9/19 Trach 9/19 (DF) >>   A:  Acute respiratory failure post cardiac arrest.  Mental status and stridor limitation to extubation.  Improved after trach 9/19.  Completed course of steroids 9/20 for upper airway. P:   Trach collar, speech valve as tolerated Titrate oxygen to keep SpO2 > 92% F/u CXR as needed Trach only 7 day old>>would not want to change trach this early Continue speech valve as tolerated  CARDIOVASCULAR  TTE:  9/10 >>> EF 45%, severe hypokinesis of inferior myocardium consistent with MI. Lines: L IJ TLC 9/10 >>9/23  A: VT/VF arrest with NSTEMI and acute systolic CHF. CAD. Atrial fib.   Hyperlipidemia. Shock, likely cardiogenic>>resolved. P:  Continue lipitor, metoprolol Change ASA to 81 mg per cardiology recommendation Continue lasix to keep in negative fluid balance>>change to per tube Anticoagulation for A fib per cardiology>>d/c SQ heparin when INR therapeutic Defer decision to add ACE inhibitor to cardiology  RENAL  Lab 10/11/11 0430 10/10/11 0435 10/09/11 0424 10/08/11 0430 10/07/11 0413  NA 141 138 139 137 139  K 4.2 3.8 -- -- --  CL 103 102 100 100 102  CO2 30 28 29 30 30   BUN 13 17 20 19 23   CREATININE 0.59 0.42* 0.42* 0.45* 0.52  CALCIUM 9.3 9.1 9.1 8.8 9.0  MG -- -- -- -- 2.1  PHOS -- -- -- -- 2.8   Intake/Output  09/24 0701 - 09/25 0700 09/25 0701 - 09/26 0700   P.O. 120    I.V. (mL/kg) 60 (0.8)    NG/GT 220    Total Intake(mL/kg) 400 (5.2)    Urine (mL/kg/hr) 1230 (0.7)    Total Output 1230    Net -830          Foley:  9/10 >>>  A: Hypokalemia. Hypophosphatemia, Hypernatremia>>resolved P:   Monitor renal fx, urine outpt F/u and replace electrolytes as needed Continue free water at 200 ml q8h  GASTROINTESTINAL  A:  Shocked liver>>resolved. Dysphagia. Nutrition. P:   D3 Diet  HEMATOLOGIC  Lab 10/11/11 0430 10/10/11 0435 10/09/11 0424  10/07/11 0413 10/06/11 0455 10/05/11 0400 10/04/11 1610  HGB -- 11.5* 11.6* 11.2* 11.0* 10.2* --  HCT -- 35.8* 35.7* 34.9* 34.1* 32.2* --  PLT -- 510* 483* 411* 397 381 --  INR 1.30 1.27 -- -- -- -- 1.51*  APTT -- -- -- -- -- -- --   A:  Coagulopathy (Coumadin induced, exacerbated by hypothermia)>>resolved. Anemia of critical illness. P: F/u CBC intermittently Transfuse for Hb < 7  INFECTIOUS  Lab 10/10/11 0435 10/09/11 0424 10/07/11 0413 10/06/11 0455 10/05/11 0400  WBC 12.5* 15.6* 18.3* 21.2* 19.4*  PROCALCITON -- -- -- -- --   Cultures: 9/12  Blood >>> negative 9/12  Urine >>> E.Coli 9/12  Respiratory >>> E coli 9/12  C.diff PCR >>> neg 9/19  LLL BAL >> normal flora  Antibiotics: 9/12  Levaquin (empiric, E coli) >>> 9/21 9/12  Flagyl (empirical, anaerobes, C.diff) >>> 9/16  A:  E coli in sputum and urine>>completed course of ABx P: Monitor clinically  ENDOCRINE  Lab 10/11/11 0925 10/10/11 2116 10/10/11 1522 10/10/11 1128 10/10/11 0740  GLUCAP 140* 144* 144* 151* 127*   A:  Hyperglycemia.   Presumed relative adrenal insufficiency>> Hydrocortisone stopped 09/30/11 P:   SSI Lantus  NEUROLOGIC A:  Acute encephalopathy in setting of cardiac arrest>>improving.   Anxiety. P:   Prn haldol, fentanyl Try to avoid benzo's  BEST PRACTICES / DISPO Transfer to telemtry 9/25 Full code D3 diet Protonix Coumadin Will need inpt rehab>>will likely be ready for inpt rehab in next 24 to 48 hrs.  Updated husband at bedside  Coralyn Helling, MD [facility] Pulmonary/Critical Care 10/11/2011, 10:41 AM Pager:  [phone] After 3pm call: [phone]

## 2011-10-11 NOTE — Progress Notes (Signed)
Physical Therapy Treatment Patient Details Name: Alicia Guerrero MRN: [id number] DOB: [birth year] Today's Date: 10/11/2011 Time: 4098-1191 PT Time Calculation (min): 51 min  PT Assessment / Plan / Recommendation Comments on Treatment Session  Pt able to make good progress with mobility today.  +2 for safety & equipment.        Follow Up Recommendations  Inpatient Rehab    Barriers to Discharge        Equipment Recommendations  None recommended by OT    Recommendations for Other Services Rehab consult  Frequency Min 3X/week   Plan Discharge plan remains appropriate;Frequency remains appropriate    Precautions / Restrictions Precautions Precautions: Fall Precaution Comments: swallowing Restrictions Weight Bearing Restrictions: No    Pertinent Vitals/Pain Pt on 28% Trach entire session.     Mobility  Bed Mobility Bed Mobility: Rolling Right;Right Sidelying to Sit;Sitting - Scoot to Edge of Bed Rolling Right: 4: Min guard;With rail Rolling Left: Not tested (comment) Right Sidelying to Sit: 4: Min guard;With rails Sitting - Scoot to Edge of Bed: 4: Min guard Details for Bed Mobility Assistance: cues for technique Transfers Transfers: Sit to Stand;Stand to Sit Sit to Stand: 3: Mod assist;With upper extremity assist;From bed Stand to Sit: 4: Min assist;With upper extremity assist;With armrests;To chair/3-in-1 Details for Transfer Assistance: cues for hand placement & technique.  Assist to achieve standing, balance, & controlled descent.   Ambulation/Gait Ambulation/Gait Assistance: 4: Min assist (+2 for IV/lines/equipment & to recliner) Ambulation Distance (Feet): 130 Feet Assistive device: Rolling walker Ambulation/Gait Assistance Details: Assist for balance & RW management.  Cues for tall posture, to stay in middle of RW (pt tends to stay closer to Lt side).   Gait Pattern: Step-through pattern;Decreased stride length;Trunk flexed Stairs: No Wheelchair Mobility Wheelchair  Mobility: No     PT Goals Acute Rehab PT Goals Time For Goal Achievement: 10/23/11 Potential to Achieve Goals: Good PT Goal: Supine/Side to Sit - Progress: Progressing toward goal PT Goal: Sit at Edge Of Bed - Progress: Progressing toward goal PT Goal: Sit to Stand - Progress: Progressing toward goal PT Transfer Goal: Bed to Chair/Chair to Bed - Progress: Progressing toward goal PT Goal: Ambulate - Progress: Progressing toward goal  Visit Information  Last PT Received On: 10/11/11 Assistance Needed: +2    Subjective Data      Cognition  Overall Cognitive Status: Impaired Area of Impairment: Following commands;Safety/judgement;Awareness of deficits;Problem solving Arousal/Alertness: Awake/alert Orientation Level: Disoriented to;Place;Situation Behavior During Session: [facility] for tasks performed Following Commands: Follows one step commands inconsistently Safety/Judgement: Decreased awareness of safety precautions;Decreased safety judgement for tasks assessed;Decreased awareness of need for assistance;Impulsive    Balance  Balance Balance Assessed: Yes Static Sitting Balance Static Sitting - Balance Support: Feet supported;No upper extremity supported Static Sitting - Level of Assistance: 5: Stand by assistance Static Sitting - Comment/# of Minutes: 10  End of Session PT - End of Session Equipment Utilized During Treatment: Gait belt;Oxygen Activity Tolerance: Patient tolerated treatment well Patient left: in chair;with call bell/phone within reach;with family/visitor present Nurse Communication: Mobility status     Verdell Face, [place] [phone] 10/11/2011

## 2011-10-12 DIAGNOSIS — Z853 Personal history of malignant neoplasm of breast: Secondary | ICD-10-CM

## 2011-10-12 LAB — BASIC METABOLIC PANEL
Chloride: 101 mEq/L (ref 96–112)
GFR calc Af Amer: >90 mL/min (ref >90)
Potassium: 4.3 mEq/L (ref 3.5–5.1)

## 2011-10-12 LAB — CBC
HCT: 37.1 % (ref 36.0–46.0)
Hemoglobin: 11.5 g/dL — ABNORMAL LOW (ref 12.0–15.0)
WBC: 9.1 10*3/uL (ref 4.0–10.5)

## 2011-10-12 LAB — GLUCOSE, CAPILLARY
Glucose-Capillary: 106 mg/dL — ABNORMAL HIGH (ref 70–99)
Glucose-Capillary: 147 mg/dL — ABNORMAL HIGH (ref 70–99)

## 2011-10-12 LAB — PROTIME-INR
INR: 1.77 — ABNORMAL HIGH (ref 0.00–1.49)
Prothrombin Time: 20 seconds — ABNORMAL HIGH (ref 11.6–15.2)

## 2011-10-12 MED ORDER — WARFARIN SODIUM 6 MG PO TABS
6.0000 mg | ORAL_TABLET | Freq: Once | ORAL | Status: DC
  Filled 2011-10-12: qty 1

## 2011-10-12 MED ORDER — METOPROLOL TARTRATE 12.5 MG HALF TABLET: 12.5000 mg | ORAL_TABLET | Freq: Two times a day (BID) | ORAL | Status: DC

## 2011-10-12 MED ORDER — INSULIN ASPART 100 UNIT/ML ~~LOC~~ SOLN: 0.0000 [IU] | Freq: Three times a day (TID) | SUBCUTANEOUS | Status: DC

## 2011-10-12 MED ORDER — ACETAMINOPHEN 325 MG PO TABS: 650.0000 mg | ORAL_TABLET | ORAL | Status: DC | PRN

## 2011-10-12 MED ORDER — INSULIN GLARGINE 100 UNIT/ML ~~LOC~~ SOLN: 15.0000 [IU] | Freq: Every day | SUBCUTANEOUS | Status: DC

## 2011-10-12 MED ORDER — FUROSEMIDE 40 MG PO TABS: 20.0000 mg | ORAL_TABLET | Freq: Every day | ORAL | Status: DC

## 2011-10-12 MED ORDER — WARFARIN SODIUM 5 MG PO TABS: 5.0000 mg | ORAL_TABLET | Freq: Every day | ORAL | Status: DC

## 2011-10-12 NOTE — Procedures (Signed)
Tracheostomy Change Note  Patient Details:   Name: Alicia Guerrero DOB: 03-10-1935 MRN: [id number]    Airway Documentation:     Evaluation  O2 sats: stable throughout Complications: No apparent complications Patient did tolerate procedure well. Bilateral Breath Sounds: Clear;Rhonchi Suctioning: Airway  #6 shiley cuffed trach replaced with #6 shiley cuffless per MD order.  Tolerated well. RT will continue to monitor.   Samiksha Pellicano Wilmoth 10/12/2011, 4:57 PM

## 2011-10-12 NOTE — Progress Notes (Signed)
Patient ID: Alicia Guerrero, female   DOB: 12-02-1935, 76 y.o.   MRN: [id number]  Patient well known to me. Spoke with her and the family. Agree with transfer her to inpatient rehabilitation. Is stable. No change in meds.

## 2011-10-12 NOTE — Progress Notes (Signed)
CSW called SNF, [facility] to confirm d/c plans for pt this afternoon.  SNF confirmed admission and is aware that pt is coming on trach.  CSW will facilitate transport when RN is ready. Vickii Penna, LCSWA [phone]  Clinical Social Work

## 2011-10-12 NOTE — Progress Notes (Signed)
Patient was suction via trach w/ small amt thick green mucus. Pt. tol well. Clean trach which had thick green mucus in it.

## 2011-10-12 NOTE — Progress Notes (Signed)
Patient had foley d.c. Around 2 p.m. On 10-11-11. Patient has tried to void and does not feel like she needs to. She is not distended or uncomfort.went into bathroom x1.Patient not sure if she vded or not. All other times used the bsc.  Without vding. baldder scan her x 2 tonight furst tune at 01:15 115 ml and next time 05:00 scan 166 ml. R.N. Aware cont to monitor patient. Patien has no I.V. fluilds and not drinking or eating much.

## 2011-10-12 NOTE — Discharge Summary (Signed)
Dr. Kalman Shan, M.D., [facility].C.P Pulmonary and Critical Care Medicine Staff Physician [facility] Pulmonary and Critical Care Pager: [phone], If no answer or between  15:00h - 7:00h: call [phone]  10/12/2011 5:32 PM

## 2011-10-12 NOTE — Progress Notes (Signed)
Speech Language Pathology Treatment Patient Details Name: Alicia Guerrero MRN: [id number] DOB: 06/12/1935 Today's Date: 10/12/2011 Time: 1530-1610 SLP Time Calculation (min): 40 min  Assessment / Plan / Recommendation Clinical Impression  Pt seen by SLP prior to d/c to SNF. Pt tolerating PMSV placement with excellent breath support and vocal quality. 100% intelligible at phrase level. RT waiting outside to change trach to a 6 cuffless prior to d/c. Pt will be able to wear PMSV during all waking hours with intermittent supervision. Pt also observed with thin liquids, Pt independently following swallowing strategies in 50% of trials. No overt s/s of aspiration observed however. Feel pts function has improved greatly. SLP also treated pt for awareness basic verbal problem solving. Pt required mod verbal cues for reasoning, redirection. Cognitive deficits will persist but are gradually improving. Memory improving. Pt will need f/u by SLP at SNF for cognition, staff/pt/family education with PMSV as husband not present today for education.     SLP Plan  Discharge SLP treatment due to (comment) (pending d/c to SNF)    Pertinent Vitals/Pain NA  SLP Goals  SLP Goals Potential to Achieve Goals: Good SLP Goal #1: pt will tolerate placement of valve during all waking hours with intermittent supervision SLP Goal #1 - Progress: Progressing toward goal SLP Goal #2: Pt will phonate at conversation level with adequate intelligiblity with moderate verbal cues.  SLP Goal #2 - Progress: Met SLP Goal #3: Pt will demosntrate placement and removal of PMSV and verbalize prequations with min cues.  SLP Goal #3 - Progress: Progressing toward goal SLP Goal #4: Pt will demonstrate emergent awareness of deficits during functional task with min contextual cues.  SLP Goal #4 - Progress: Progressing toward goal SLP Goal #5: Pt will complete basic verbal problem solving task with mod contextual cues.  SLP Goal #5 - Progress:  Progressing toward goal  General Temperature Spikes Noted: No Respiratory Status: Trach Behavior/Cognition: Alert;Cooperative;Pleasant mood;Confused Oral Cavity - Dentition: Missing dentition Patient Positioning: Upright in bed  Oral Cavity - Oral Hygiene Does patient have any of the following "at risk" factors?: Oxygen therapy - cannula, mask, simple oxygen devices;Other - dysphagia;Saliva - thick, dry mouth   Treatment Treatment focused on: Cognition;Voice;Patient/family/caregiver education;Other (comment) (Dysphagia) Skilled Treatment: contextual visual cues with basic functional problem solving, awareness, impulsivity   GO     Adreyan Carbajal, [name] 10/12/2011, 4:21 PM

## 2011-10-12 NOTE — Progress Notes (Signed)
ANTICOAGULATION CONSULT NOTE - Follow Up Consult  Pharmacy Consult for Coumadin Indication: Hx Afib  Allergies  Allergen Reactions  . Meperidine Hcl Other (See Comments)    Mixed with phenergan swelling of the mouth   . Penicillins Other (See Comments)    Unknown reaction  . Molds & Smuts Other (See Comments)    Runny nose  . Tape Other (See Comments)    Redness, pulls skin off    Patient Measurements: Height: 5' 3.5" (161.3 cm) Weight: 157 lb 6.4 oz (71.396 kg) IBW/kg (Calculated) : 53.55  Heparin Dosing Weight:   Vital Signs: Temp: 99.3 F (37.4 C) (09/26 0343) Temp src: Oral (09/26 0343) BP: 124/82 mmHg (09/26 0343) Pulse Rate: 65  (09/26 0440)  Labs:  Basename 10/12/11 0620 10/11/11 0430 10/10/11 0435  HGB -- -- 11.5*  HCT -- -- 35.8*  PLT -- -- 510*  APTT -- -- --  LABPROT 20.0* 15.9* 15.6*  INR 1.77* 1.30 1.27  HEPARINUNFRC -- -- --  CREATININE 0.64 0.59 0.42*  CKTOTAL -- -- --  CKMB -- -- --  TROPONINI -- -- --    Estimated Creatinine Clearance: 59.1 ml/min (by C-G formula based on Cr of 0.64).  Assessment: 76yof continuing Coumadin for h/o Afib. INR (1.77) is subtherapeutic but is now trending up with larger doses than PTA regimen (5mg  daily). Expect INR to continue increasing with Coumadin 10mg  dose given 9/25 - will decrease dose near home regimen and f/u AM INR. Patient is also receiving SQ heparin while INR < 2.  - No CBC this AM - No significant bleeding reported  Goal of Therapy:  INR 2-3   Plan:  1. Coumadin 6mg  po x 1 today 2. Follow-up AM INR  Alicia Guerrero [phone] 10/12/2011,9:31 AM

## 2011-10-12 NOTE — Progress Notes (Signed)
Patient was suctoin via trach by dana r.n.  Small amt mucus. I did trach care trach had mod. thick green mucus in it. Pt. Tol. Well.

## 2011-10-12 NOTE — Progress Notes (Signed)
Clinical Social Work-CSW facilitated pt d/c to SNF [facility]) with [facility] transport once RN confirmed pt medically stable- Jodean Lima, [phone]

## 2011-10-12 NOTE — Discharge Summary (Signed)
Physician Discharge Summary     Patient ID: Alicia Guerrero MRN: [id number] DOB/AGE: [birth year] 76 y.o.  Admit date: 09/26/2011 Discharge date: 10/12/2011  Admission Diagnoses: Cardiac arrest Respiratory failure  Acute encephalopathy  Discharge Diagnoses:  Principal Problem:  *Cardiac arrest - ventricular fibrillation Active Problems:  HYPERTENSION  CORONARY ARTERY DISEASE  Acute respiratory failure  Altered mental status  Acute non-ST segment elevation myocardial infarction  Hypokalemia  Sinus bradycardia  Hyperosmolality and/or hypernatremia  PAF (paroxysmal atrial fibrillation)   Significant Hospital tests/ studies/ interventions and procedures  Cultures:  9/12 Blood >>> negative  9/12 Urine >>> E.Coli  9/12 Respiratory >>> E coli  9/12 C.diff PCR >>> neg  9/19 LLL BAL >> normal flora   Antibiotics:  9/12 Levaquin (empiric, E coli) >>> 9/21  9/12 Flagyl (empirical, anaerobes, C.diff) >>> 9/16   ETT: 9/10>>> 9/16  ETT: 9/16 (reintubated for stridor) >> 9/19  Trach 9/19 (DF) >> TTE: 9/10 >>> EF 45%, severe hypokinesis of inferior myocardium consistent with MI.  Lines: L IJ TLC 9/10 >>9/23   Patient summary: 76 yo with extensive cardiac history brought to [facility] after VT/VF arrest. Transferred to [facility] on 9/10 for hypothermia protocol.   Events Since Admission:  9/10 VT/VF arrest, intubated, hypothermia initiated  9/11 Hypothermia stopped as multiple arrhythmias and coagulopathy  9/19 s/p tracheostomy Tyson Alias)  9/23 Lt heart cath>>Stents patent in RCA/PDA, LAD, and D1. 95% ostial stenosis of a moderate to large OM1.  9/24 FEES>>D3 diet, thin liquids   Hospital Course:  Acute respiratory failure post cardiac arrest. Mental status and stridor were limitation to extubation. Improved after trach 9/19. Completed course of steroids 9/20 for upper airway. Ultimately after failing extubation had trach placed.  Recommendations:  Trach collar, speech valve as tolerated    Titrate oxygen to keep SpO2 > 92%  F/u at [facility] trach clinic on:  >> October 10 th at 11 am Continue speech valve as tolerated   VT/VF arrest with NSTEMI and acute systolic CHF.  CAD.  Atrial fib.  Hyperlipidemia.  Shock, likely cardiogenic>>resolved. Lab Results  Component Value Date   INR 1.77* 10/12/2011   INR 1.30 10/11/2011   INR 1.27 10/10/2011  Admitted to the ICU s/p VT arrest w/ NSTEMI. Placed on hypothermia protocol which had to be discontinued d/t coagulopathy. She was hypotensive and in shock initially. This was felt to be due to cardiogenic shock. She was treated w/ volume optimization interventions as well as IV vaso-active gtts. She eventually was weaned of pressors and was hemodynamically stabilized. She had a left Heart cath on 9/23 Lt heart cath>>Stents patent in RCA/PDA, LAD, and D1. 95% ostial stenosis of a moderate to large OM1. This was felt to demonstrate minimal change from prior study, so card recs were to continue medical therapy.   Recommendations:  Continue lipitor, metoprolol  Change ASA to 81 mg per cardiology recommendation  Continue lasix   F/u with Dr wall after d/c from SNF Continue coumadin Defer decision to add ACE inhibitor to cardiology  Pneumonia (E-Coli). Isolated on sputum culture. Completed full course of antibiotics.   ECOLI UTI S/p full course of antibiotics.   Hypokalemia>>resolved.  Hypophosphatemia>>resolved Hypernatremia>>resolved  Has had multiple intermittent electrolyte imbalances acutely and during rehabilitation phase of illness. At this point mostly due to diuretic therapy.  Recommendations:  Chemistry 1-2 weeks after d/c  Continue free water at 200 ml q8h  Shocked liver>>resolved. This was noted s/p cardiac arrest and shock state. Treated in  supportive measures by treating shock. Resolved w/ supportive care. Dysphagia Passed FEES swallow eval on 9/24 Recommendation:  Dysphagia 3 diet Recommend continued SLP eval for  diet modification  Coagulopathy (Coumadin induced, exacerbated by hypothermia)>>resolved. Anemia of critical illness. Not needing transfusions.  Recommendations:  F/u CBC intermittently   Hyperglycemia.  Presumed relative adrenal insufficiency>> Hydrocortisone stopped 09/30/11 Treated w/ Sliding scale insulin during her hospitalization.  Had persistent pressor dependant shock state. Assumed some degree of adrenal insufficiency so was treated as such with stress dose steroids. These are now stopped.  Recommendation  SSI   Acute encephalopathy in setting of cardiac arrest>>improving.  Anxiety. Likely multifactorial, hypoxia, pneumonia, residual ICU delirium. Seems to be improving slowly.  Recommendation  Supportive care  Try to avoid benzos    Deconditioning Recommendation Needs in-pt PT/OT.  Discharge Exam: BP 124/82  Pulse 65  Temp 99.3 F (37.4 C) (Oral)  Resp 16  Ht 5' 3.5" (1.613 m)  Wt 71.396 kg (157 lb 6.4 oz)  BMI 27.44 kg/m2  SpO2 95%  Physical Examination:  General: No distress  Neuro: Alert, follows commands  HEENT: trach site clean #6 trach, good phonation w/ PMV Cardiovascular: RRR, no murmurs  Lungs: no wheeze, occ rhonchi that improve w/ cough  Abdomen: Soft, bowel sounds present  Musculoskeletal: No edema  Skin: Intact.   Labs at discharge Lab Results  Component Value Date   CREATININE 0.64 10/12/2011   BUN 12 10/12/2011   NA 139 10/12/2011   K 4.3 10/12/2011   CL 101 10/12/2011   CO2 29 10/12/2011   Lab Results  Component Value Date   WBC 9.1 10/12/2011   HGB 11.5* 10/12/2011   HCT 37.1 10/12/2011   MCV 83.2 10/12/2011   PLT 622* 10/12/2011   Lab Results  Component Value Date   ALT 36* 10/04/2011   AST 20 10/04/2011   ALKPHOS 84 10/04/2011   BILITOT 0.3 10/04/2011   Lab Results  Component Value Date   INR 1.77* 10/12/2011   INR 1.30 10/11/2011   INR 1.27 10/10/2011    Current radiology studies Dg Swallowing Func-speech Pathology  10/10/2011   CLINICAL DATA: objectively evaluate swallow function   FLUOROSCOPY FOR SWALLOWING FUNCTION STUDY:  Fluoroscopy was provided for swallowing function study, which was  administered by a speech pathologist.  Final results and recommendations  from this study are contained within the speech pathology report.      Disposition:  01-Home or Self Care      Discharge Orders    Future Appointments: Provider: Department: Dept Phone: Center:   10/26/2011 11:00 AM [facility] [phone] None   08/01/2012 10:00 AM Krista Blue [facility] [phone] None   08/08/2012 9:30 AM Pierce Crane, MD [facility] [phone] None     Future Orders Please Complete By Expires   Diet - low sodium heart healthy      Comments:   Dysphagia 3, thin liquids   Increase activity slowly      Change dressing (specify)      Comments:   Dressing change: trach care bid and PRN 1/2 peroxide, 1/2 NS clean around stoma site, Dress w/ trach dressing Change inner cannula with every dressing change.       Medication List     As of 10/12/2011 11:17 AM    STOP taking these medications         amLODipine 10 MG tablet   Commonly known as: NORVASC      OVER  THE COUNTER MEDICATION      trandolapril 4 MG tablet   Commonly known as: [name]      TAKE these medications         acetaminophen 325 MG tablet   Commonly known as: TYLENOL   Take 2 tablets (650 mg total) by mouth every 4 (four) hours as needed.      aspirin EC 81 MG tablet   Take 81 mg by mouth every evening.      atorvastatin 40 MG tablet   Commonly known as: LIPITOR   Take 40 mg by mouth daily.      furosemide 40 MG tablet   Commonly known as: LASIX   Take 0.5 tablets (20 mg total) by mouth daily.      insulin aspart 100 UNIT/ML injection   Commonly known as: novoLOG   Inject 0-20 Units into the skin 3 (three) times daily with meals.      insulin glargine 100 UNIT/ML injection   Commonly known as:  LANTUS   Inject 15 Units into the skin at bedtime.      metoprolol tartrate 12.5 mg Tabs   Commonly known as: LOPRESSOR   Take 0.5 tablets (12.5 mg total) by mouth 2 (two) times daily.      multivitamin with minerals Tabs   Take 1 tablet by mouth daily.      nitroGLYCERIN 0.4 MG SL tablet   Commonly known as: NITROSTAT   Place 0.4 mg under the tongue every 5 (five) minutes x 3 doses as needed. Chest pain      potassium chloride SA 20 MEQ tablet   Commonly known as: K-DUR,KLOR-CON   Take 20 mEq by mouth daily.      warfarin 5 MG tablet   Commonly known as: COUMADIN   Take 1 tablet (5 mg total) by mouth daily.        Follow-up Information    Follow up with [facility]. On 10/26/2011. (at 11 am)    Contact information:   [facility] [address] [place] [address] [phone]      Schedule an appointment as soon as possible for a visit with Valera Castle, MD. (after d/c from SNF)    Contact information:   [address] [place] [address] [phone]          Discharged Condition: {fair Physician Statement:   The Patient was personally examined, the discharge assessment and plan has been personally reviewed and I agree with ACNP Babcock's assessment and plan. > 30 minutes of time have been dedicated to discharge assessment, planning and discharge instructions.   Signed: BABCOCK,PETE 10/12/2011, 11:17 AM

## 2011-10-12 NOTE — Progress Notes (Signed)
Safety zone file for missing property e.g. Teeth, clothing and glasses, per Risk Management instruction.  RN first called [facility], [facility] security, [facility] and [facility] in attempt to locate missing belongings.  Family and patient informed, charge RN aware.

## 2011-10-26 ENCOUNTER — Ambulatory Visit (HOSPITAL_COMMUNITY): Admit: 2011-10-26 | Payer: Self-pay | Admitting: Internal Medicine

## 2011-10-26 ENCOUNTER — Encounter (HOSPITAL_COMMUNITY): Payer: Self-pay

## 2011-10-26 ENCOUNTER — Encounter (HOSPITAL_COMMUNITY)
Admission: RE | Disposition: A | Discharge status: Home / Self Care | Payer: Self-pay | Source: Ambulatory Visit | Attending: Internal Medicine

## 2011-10-26 ENCOUNTER — Encounter (HOSPITAL_COMMUNITY): Payer: Self-pay | Admitting: Respiratory Therapy

## 2011-10-26 ENCOUNTER — Ambulatory Visit (HOSPITAL_COMMUNITY)
Admission: RE | Admit: 2011-10-26 | Discharge: 2011-10-26 | Disposition: A | Discharge status: Home / Self Care | Payer: Medicare Other | Source: Ambulatory Visit | Attending: Internal Medicine | Admitting: Internal Medicine

## 2011-10-26 ENCOUNTER — Ambulatory Visit (HOSPITAL_COMMUNITY)
Admit: 2011-10-26 | Discharge: 2011-10-26 | Disposition: A | Discharge status: Home / Self Care | Payer: Medicare Other | Attending: Internal Medicine | Admitting: Internal Medicine

## 2011-10-26 DIAGNOSIS — I4891 Unspecified atrial fibrillation: Secondary | ICD-10-CM

## 2011-10-26 DIAGNOSIS — I214 Non-ST elevation (NSTEMI) myocardial infarction: Secondary | ICD-10-CM

## 2011-10-26 DIAGNOSIS — I2510 Atherosclerotic heart disease of native coronary artery without angina pectoris: Secondary | ICD-10-CM | POA: Insufficient documentation

## 2011-10-26 DIAGNOSIS — I219 Acute myocardial infarction, unspecified: Secondary | ICD-10-CM

## 2011-10-26 DIAGNOSIS — R061 Stridor: Secondary | ICD-10-CM | POA: Insufficient documentation

## 2011-10-26 DIAGNOSIS — Z430 Encounter for attention to tracheostomy: Secondary | ICD-10-CM | POA: Insufficient documentation

## 2011-10-26 DIAGNOSIS — I480 Paroxysmal atrial fibrillation: Secondary | ICD-10-CM

## 2011-10-26 DIAGNOSIS — J9600 Acute respiratory failure, unspecified whether with hypoxia or hypercapnia: Secondary | ICD-10-CM | POA: Insufficient documentation

## 2011-10-26 HISTORY — PX: VIDEO BRONCHOSCOPY: SHX5072

## 2011-10-26 SURGERY — VIDEO BRONCHOSCOPY WITHOUT FLUORO
Anesthesia: Moderate Sedation | Laterality: Bilateral

## 2011-10-26 SURGERY — VIDEO BRONCHOSCOPY WITHOUT FLUORO
Laterality: Bilateral

## 2011-10-26 MED ORDER — PHENYLEPHRINE HCL 0.25 % NA SOLN
NASAL | Status: DC | PRN
  Administered 2011-10-26: 1 via NASAL

## 2011-10-26 MED ORDER — LIDOCAINE HCL 2 % EX GEL
CUTANEOUS | Status: DC | PRN
  Administered 2011-10-26: 1

## 2011-10-26 NOTE — Consult Note (Signed)
Name: Alicia Guerrero MRN: [id number] DOB: [birth year]    LOS: 0  Referring Provider:  Dr. Craige Cotta upon dc from [facility] Reason for Referral:  Trach assessment  PULMONARY / CRITICAL CARE MEDICINE  HPI:  76 yr old well known to our service s/p hypothrmia for VT arrest, NSTEMI, cath med management. Trach from stridor upon extubation, required reintubation then trach.  To NH for rehab. Eating well, speaking valve well daytime no issues. Moves secretions through mouth well. No fevers, no cp. No dc from trach. Neuro wise , ambulating well, feeding self, appears strong. Presents for eval for deCANN.  Past Medical History  Diagnosis Date  . Left bundle branch block   . Atrial fibrillation   . Acute myocardial infarction, unspecified site, episode of care unspecified   . Coronary atherosclerosis   . Pure hypercholesterolemia   . Unspecified essential hypertension   . Cough   . Other atopic dermatitis and related conditions   . Acute upper respiratory infections of unspecified site   . Allergic rhinitis due to pollen   . Unspecified asthma   . Other and unspecified ovarian cyst   . Personal history of malignant neoplasm of breast   . Personal history of hyperthyroidism   . Chronic eczema     hands  . Hx of coronary angioplasty     percutaneous transluminal  . Hemorrhoids   . Cancer    Past Surgical History  Procedure Date  . Oophorectomy   . Abdominal hysterectomy   . Ptca   . Cholecystectomy   . Coronary stent placement may and  july 2012  . Breast lumpectomy 08/24/2003    right lumpectomy+sln,T2N0,ERPR+,Her2-  . Polypectomy   . Colonoscopy    Prior to Admission medications   Medication Sig Start Date End Date Taking? Authorizing Provider  acetaminophen (TYLENOL) 325 MG tablet Take 2 tablets (650 mg total) by mouth every 4 (four) hours as needed. 10/12/11   Simonne Martinet, NP  aspirin EC 81 MG tablet Take 81 mg by mouth every evening.     Historical Provider, MD  atorvastatin (LIPITOR)  40 MG tablet Take 40 mg by mouth daily.    Historical Provider, MD  furosemide (LASIX) 40 MG tablet Take 0.5 tablets (20 mg total) by mouth daily. 10/12/11   Simonne Martinet, NP  insulin aspart (NOVOLOG) 100 UNIT/ML injection Inject 0-20 Units into the skin 3 (three) times daily with meals. 10/12/11   Simonne Martinet, NP  insulin glargine (LANTUS) 100 UNIT/ML injection Inject 15 Units into the skin at bedtime. 10/12/11   Simonne Martinet, NP  metoprolol tartrate (LOPRESSOR) 12.5 mg TABS Take 0.5 tablets (12.5 mg total) by mouth 2 (two) times daily. 10/12/11   Simonne Martinet, NP  Multiple Vitamin (MULTIVITAMIN WITH MINERALS) TABS Take 1 tablet by mouth daily.    Historical Provider, MD  nitroGLYCERIN (NITROSTAT) 0.4 MG SL tablet Place 0.4 mg under the tongue every 5 (five) minutes x 3 doses as needed. Chest pain    Historical Provider, MD  potassium chloride SA (K-DUR,KLOR-CON) 20 MEQ tablet Take 20 mEq by mouth daily.    Historical Provider, MD  warfarin (COUMADIN) 5 MG tablet Take 1 tablet (5 mg total) by mouth daily. 10/12/11   Simonne Martinet, NP   Allergies Allergies  Allergen Reactions  . Meperidine Hcl Other (See Comments)    Mixed with phenergan swelling of the mouth   . Penicillins Other (See Comments)    Unknown reaction  .  Molds & Smuts Other (See Comments)    Runny nose  . Tape Other (See Comments)    Redness, pulls skin off    Family History Family History  Problem Relation Age of Onset  . Uterine cancer Mother   . Hypertension Mother   . Coronary artery disease Father   . Heart disease Sister   . Heart disease Brother   . Endometrial cancer Sister   . Heart disease Brother   . Clotting disorder Brother     blood clot  . Colon cancer Neg Hx    Social History  reports that she has never smoked. She has never used smokeless tobacco. She reports that she does not drink alcohol or use illicit drugs.  Review Of Systems:  NO D/N/V/WT LOSS, STRONGER, ambulates  Current  Status:  Vital Signs: Hr 78 BP 154/84   rr 12    sats 95%,   Physical Examination: General: awake, appears strong Neuro:  Alert, nonfocal Neck:  Trach clean 6 cuffless, no dc, no drainage, no erythema, appears wonderful, moves air well Cardiovascular:  s1 s2 rrr no r Lungs:  CTA Abdomen:  Soft, bs wnl no r Musculoskeletal:  Soft bs wnl Skin:  No broozing  Active Problems:  * No active hospital problems. *    ASSESSMENT AND PLAN  PULMONARY   Trach 9/19 df>>>  A:  S/p 6  trach from upper airway obstuction, stridor, presumed vocal cord edema P:   She presents with 6 cuffless now that was changed at dc from [facility] 9/26 She appears well, strong, PMV well PLEASE note PMV is not to be used at night for any pt; she reported use at night and have educated her on that She eats, speaks, is ambulating well and moving secretions that are minimal Will do assessment cords as reintubation was dramatic and life threatening then likely decannualte  Update: Cords assessed-->nromal abdo, add, post arrytnoids, epiglottis Plan decannulation and observe in [facility] bronch for 1 hr Vasolin Gauze bid x 3 days then dc or daily if discharge noted She can only use paper tape  Mcarthur Rossetti. Tyson Alias, MD, FACP Pgr: [phone] [facility]

## 2011-10-26 NOTE — Progress Notes (Signed)
See additional note. 

## 2011-10-26 NOTE — Procedures (Signed)
Laryngoscopy  Indication: Eval edema, cord fxn , s/p trach from stridor  Consent obtained  Pre treatment neosyn, 2% lido gel  Scope placed left nostril  1. Cords wnl, no edema, excellent fxn, no stenosis, normal arrytnoids, valec, epiglottis  VS wnl, sats 94%, tolerarted well   Mcarthur Rossetti. Tyson Alias, MD, FACP Pgr: [phone] [facility]

## 2011-10-26 NOTE — Progress Notes (Signed)
Video Laryngoscopy done  No interventions Visualization of upper airway before decannulation Procedure tolerated well

## 2011-10-26 NOTE — Progress Notes (Addendum)
Tracheostomy Procedure Note  Alicia Guerrero [id number] [birth year]   Procedure:Trach cleaning Laryngoscopy and deccanulation    Tracheostomy Information  Trach Brand: Shiley Size: 6.0 Style: Uncuffed Secured by: Velcro  Evaluation:  ETCO2 positive color change from yellow to purple : no  ( not needed) Vital signs:blood pressure 155/74, pulse 68, respirations 18 and pulse oximetry 96 % Patients current condition: stable Complications: No apparent complications none Trach site exam: clean Wound care done: Other: vasoline gauze Patient did tolerate procedure well.di tolerated weel   Education None needed  Additional needs MD progress note sent back with pt and husband to nursing facility  Pt  Keep for observation 1 hour post extubation  Vital signs stable  Trach out at 12:10  BP  169/82  HR 72  RR 20 o2 saturation  95%  15 minutes post extubation  BP  159/95/  HR 70  RR 18 Spo2  94%  30 minutes post extubation   BP 161/72  HR 61  RR 20  Spo2 94%  1 hour post decannulation BP 171/79  HR 63  RR 20  Spo2 95%  Pt dsicharged back nursing facility

## 2011-10-28 ENCOUNTER — Telehealth: Payer: Self-pay | Admitting: Pulmonary Disease

## 2011-10-28 NOTE — Telephone Encounter (Signed)
Ms Aker's son called; the patient was discharged from SNF on Lantus 15U at night; they do not have insulin/lantus, strips, lancets, syringes or any other diabetic supplies. They also voiced concerns that she might develop hypoglycemia now that at home;   I asked them to hold the Lantus; check CBG qac and hs and keep a diary of all recorded levels. I advised them to call Dr. Izora Ribas office on Monday morning 10/30/11 and schedule an appointment with him first available to review the glycemic levels and discuss the control.   I called in to their pharmacy [facility]) strips, syringes and Lantus. She will use her daughter's acucheck machine for now, but she will need her own in the future if she stays on Lantus. They will though not administer Lantus until will see Dr. Debby Bud.   The patient's son and the patient were in agreement with the plan. They will call Dr. Izora Ribas office on Monday for appointment.

## 2011-10-30 ENCOUNTER — Encounter (HOSPITAL_COMMUNITY): Payer: Self-pay | Admitting: Internal Medicine

## 2011-10-31 ENCOUNTER — Ambulatory Visit (INDEPENDENT_AMBULATORY_CARE_PROVIDER_SITE_OTHER): Payer: Medicare Other | Admitting: Physician Assistant

## 2011-10-31 ENCOUNTER — Encounter: Payer: Self-pay | Admitting: Physician Assistant

## 2011-10-31 VITALS — BP 170/90 | HR 62 | Ht 64.0 in | Wt 161.0 lb

## 2011-10-31 DIAGNOSIS — I5022 Chronic systolic (congestive) heart failure: Secondary | ICD-10-CM

## 2011-10-31 DIAGNOSIS — I10 Essential (primary) hypertension: Secondary | ICD-10-CM

## 2011-10-31 DIAGNOSIS — E782 Mixed hyperlipidemia: Secondary | ICD-10-CM

## 2011-10-31 DIAGNOSIS — I2510 Atherosclerotic heart disease of native coronary artery without angina pectoris: Secondary | ICD-10-CM

## 2011-10-31 DIAGNOSIS — I4901 Ventricular fibrillation: Secondary | ICD-10-CM

## 2011-10-31 DIAGNOSIS — I4891 Unspecified atrial fibrillation: Secondary | ICD-10-CM

## 2011-10-31 LAB — BASIC METABOLIC PANEL
CO2: 29 mEq/L (ref 19–32)
Calcium: 9.5 mg/dL (ref 8.4–10.5)
Chloride: 103 mEq/L (ref 96–112)
Creatinine, Ser: 0.6 mg/dL (ref 0.4–1.2)
Glucose, Bld: 90 mg/dL (ref 70–99)

## 2011-10-31 MED ORDER — TRANDOLAPRIL 4 MG PO TABS: 4.0000 mg | ORAL_TABLET | Freq: Every day | ORAL | Status: DC

## 2011-10-31 NOTE — Patient Instructions (Addendum)
Your physician recommends that you return for lab work in: TODAY BMET  REPEAT BMET 1 WEEK  PER PT REQUEST PLEASE SCHEDULE PT TO SEE DR. WALL IN 1 MONTH THE SAME DAY PT'S HUSBAND SEES DR. Riley Kill  [name] PRESCRIPTION WAS SENT IN TO [facility] TODAY FOR YOU

## 2011-10-31 NOTE — Progress Notes (Addendum)
[address] [place], [place]  [address] Phone: [phone] Fax:  [phone]  Date:  10/31/2011   Name:  Alicia Guerrero   DOB:  03/31/1935   MRN:  [id number]  PCP:  Illene Regulus, MD  Primary Cardiologist:  Dr. Valera Castle  Primary Electrophysiologist:  Dr. Sherryl Manges   History of Present Illness: Alicia Guerrero is a 76 y.o. female who returns for follow up after recent hospitalization.    She has a hx of CAD, s/p multiple PCIs in the past. Last PCI with DES to RCA in setting of NSTEMI in 4/12.  Previous EF has been 40%, but normal by other measurements. Other hx includes AFib, HTN, HL, LBBB.    She was admitted 9/10-9/26 with witnessed cardiac arrest. Resuscitation in the field was unsuccessful. Upon arrival to the emergency room, she was in PEA. She was defibrillated multiple times and treated with amiodarone. She eventually had return of spontaneous rhythm. She was treated with cooling protocol. Potassium was very low at 2.1 upon initial presentation. Her cardiac markers did return elevated. She was seen by cardiology as well as electrophysiology. Echocardiogram demonstrated an EF 40-45% and severe inf HK to AK c/w infarction. CT was negative for pulmonary embolism. She eventually underwent LHC 10/09/11 that demonstrated patent stents and high grade lesion in a mod to large OM2. Overall, medical management was felt to be the best course of action (appeared OM2 lesion may have been chronic). Her hospitalization was complicated by difficulty in extubation. She underwent tracheostomy.  She was discharged to skilled nursing facility.  She is now at home. Her tracheostomy tube has been removed. She is overall doing well. She denies chest pain. Her breathing is much improved. She probably describes class II-IIb symptoms. She denies orthopnea, PND or edema. She is increasing her activity steadily.  Labs (9/13):  K 4.3, creatinine 0.64, ALT 36, Hgb 11.5  Wt Readings from  Last 3 Encounters:  10/31/11 161 lb (73.029 kg)  10/12/11 157 lb 6.4 oz (71.396 kg)  10/12/11 157 lb 6.4 oz (71.396 kg)     Past Medical History  Diagnosis Date  . Left bundle branch block   . Atrial fibrillation   . CAD (coronary artery disease)     a. s/p stent to RCA 1996;  b. s/p cutting balloon PTCA to LAD 2001; c. s/p Cypher DES to LAD 2003;  d. NSTEMI 4/12: DES to dRCA;  e.  LHC 9/13 (after presentation with cardiac arrest and + Nz's for NSTEMI): pLAD 30% ISR, oD1 30%, dLAD 60-70%, pOM1 (small vessel) 90%, oOM2 95% (moderate to large vessel), mCFX 30%, pOM3 30-40%, dRCA stents and pPDA stents patent with 30% ISR => med Rx rec.   . Ischemic cardiomyopathy     a. EF 40% at cath 4/12 with AL and Inf HK;  b.  Echo after presentation with cardiac arrest 9/13:  EF 40-45% and severe inf HK to AK c/w infarction, PASP 42  . HLD (hyperlipidemia)   . HTN (hypertension)   . Other atopic dermatitis and related conditions   . Allergic rhinitis due to pollen   . Asthma   . Other and unspecified ovarian cyst   . Personal history of malignant neoplasm of breast   . Personal history of hyperthyroidism   . Chronic eczema     hands  . Hemorrhoids   . Cancer   . Bradycardia     a. coreg tapered off 05/2011 => b.  Metoprolol restarted after cardiac arrest 09/2011  . Cardiac arrest 09/2011    a. VT/VF in community; resusc unsuccessful;  PEA in ED; multiple defibs => revived;  [employer];  c/b by VDRF, cardiogenic shock;  LHC with stable anatomy => med Rx;  difficult ween from vent => s/p trach;  d/c to SNF  . Chronic systolic heart failure     Current Outpatient Prescriptions  Medication Sig Dispense Refill  . acetaminophen (TYLENOL) 325 MG tablet Take 2 tablets (650 mg total) by mouth every 4 (four) hours as needed.      Marland Kitchen aspirin EC 81 MG tablet Take 81 mg by mouth every evening.       Marland Kitchen atorvastatin (LIPITOR) 40 MG tablet Take 40 mg by mouth daily.      . DiphenhydrAMINE HCl (BENADRYL  ALLERGY PO) Take by mouth. AS NEEDED FOR SLEEP      . furosemide (LASIX) 40 MG tablet Take 0.5 tablets (20 mg total) by mouth daily.  30 tablet  6  . metoprolol tartrate (LOPRESSOR) 12.5 mg TABS Take 0.5 tablets (12.5 mg total) by mouth 2 (two) times daily.  30 tablet  6  . Multiple Vitamin (MULTIVITAMIN WITH MINERALS) TABS Take 1 tablet by mouth daily.      . nitroGLYCERIN (NITROSTAT) 0.4 MG SL tablet Place 0.4 mg under the tongue every 5 (five) minutes x 3 doses as needed. Chest pain      . potassium chloride SA (K-DUR,KLOR-CON) 20 MEQ tablet Take 20 mEq by mouth daily.      Marland Kitchen warfarin (COUMADIN) 5 MG tablet Take 1 tablet (5 mg total) by mouth daily.  30 tablet  6    Allergies: Allergies  Allergen Reactions  . Meperidine Hcl Other (See Comments)    Mixed with phenergan swelling of the mouth   . Penicillins Other (See Comments)    Unknown reaction  . Molds & Smuts Other (See Comments)    Runny nose  . Tape Other (See Comments)    Redness, pulls skin off    History  Substance Use Topics  . Smoking status: Never Smoker   . Smokeless tobacco: Never Used  . Alcohol Use: No     ROS:  Please see the history of present illness.  She has a mild cough with clear sputum production. This is improving.  All other systems reviewed and negative.   PHYSICAL EXAM: VS:  BP 170/90  Pulse 62  Ht 5\' 4"  (1.626 m)  Wt 161 lb (73.029 kg)  BMI 27.64 kg/m2 Well nourished, well developed, in no acute distress HEENT: normal Neck: no JVD Cardiac:  normal S1, S2; RRR; no murmur Lungs:  clear to auscultation bilaterally, no wheezing, rhonchi or rales Abd: soft, nontender, no hepatomegaly Ext: Trace bilateral LE edema; right wrist without hematoma or bruit  Skin: warm and dry Neuro:  CNs 2-12 intact, no focal abnormalities noted  EKG:  NSR, HR 62, LBBB      ASSESSMENT AND PLAN:  1.  Ventricular Fibrillation Arrest:  She has recovered nicely. As noted, her cardiac catheterization demonstrated  fairly stable anatomy. The lesion in the second obtuse marginal appeared to be fairly chronic. She was treated medically. Her ejection fraction was 45% by echocardiogram. She was followed by electrophysiology in the hospital. I reviewed her chart. It does not appear that AICD was recommended. I will review this with Dr. Graciela Husbands.  2. Coronary Artery Disease: Continue aspirin and statin. She is tolerating low-dose beta  blocker. Followup with Dr. Daleen Squibb in one month.  3. Hypertension: Most of her medications were held at discharge. I presume this was related to hypotension from cardiogenic shock. She is now hypertensive. She was previously on [name] and amlodipine. I will start by adding [name] 4 mg daily Bactrim medical regimen. Check a basic metabolic panel today and repeat in one week.  4. Hyperlipidemia: Continue statin.  5. Atrial Fibrillation: Maintaining sinus rhythm. She is maintained on Coumadin which is followed by Dr. Christell Constant in [place].  6. Ventilator Dependent Respiratory Failure: Resolved. She had a tracheostomy. The tube has been removed. She will followup with pulmonary as directed.  7. Systolic CHF: Volume appears stable on low-dose Lasix. Check a basic metabolic panel today. She is on beta blocker therapy. ACE inhibitor will be added back.  Luna Glasgow, PA-C  12:17 PM 10/31/2011      ADDENDUM 11/01/2011 1:55 PM   I discussed the patient's case today with Dr. Sherryl Manges.  The patient is at high risk for recurrent cardiac arrest.  She is not wearing a LifeVest.  Dr. Graciela Husbands d/w Dr. Johney Frame over the phone.  We decided her best course of action would be to be admitted to the hospital today.  She will need an AICD implanted and this will be done tomorrow with Dr. Hillis Range.  I have left a message at the patient's home for her to call back.  We will request a bed.  She will be sent to the hospital once a bed is available today and our team in the hospital will write orders.  Signed,   [name], PA-C  2:00 PM 11/01/2011

## 2011-11-01 ENCOUNTER — Encounter: Payer: Self-pay | Admitting: Physician Assistant

## 2011-11-01 ENCOUNTER — Telehealth: Payer: Self-pay

## 2011-11-01 ENCOUNTER — Observation Stay (HOSPITAL_COMMUNITY)
Admission: AD | Admit: 2011-11-01 | Discharge: 2011-11-03 | Disposition: A | Discharge status: Home Health | Payer: Medicare Other | Source: Ambulatory Visit | Attending: Internal Medicine | Admitting: Internal Medicine

## 2011-11-01 ENCOUNTER — Telehealth: Payer: Self-pay | Admitting: *Deleted

## 2011-11-01 DIAGNOSIS — J9600 Acute respiratory failure, unspecified whether with hypoxia or hypercapnia: Secondary | ICD-10-CM

## 2011-11-01 DIAGNOSIS — E782 Mixed hyperlipidemia: Secondary | ICD-10-CM

## 2011-11-01 DIAGNOSIS — I2510 Atherosclerotic heart disease of native coronary artery without angina pectoris: Secondary | ICD-10-CM

## 2011-11-01 DIAGNOSIS — I4901 Ventricular fibrillation: Secondary | ICD-10-CM

## 2011-11-01 DIAGNOSIS — Z8674 Personal history of sudden cardiac arrest: Secondary | ICD-10-CM | POA: Insufficient documentation

## 2011-11-01 DIAGNOSIS — I4729 Other ventricular tachycardia: Secondary | ICD-10-CM | POA: Insufficient documentation

## 2011-11-01 DIAGNOSIS — M7989 Other specified soft tissue disorders: Secondary | ICD-10-CM | POA: Insufficient documentation

## 2011-11-01 DIAGNOSIS — I472 Ventricular tachycardia, unspecified: Secondary | ICD-10-CM | POA: Insufficient documentation

## 2011-11-01 DIAGNOSIS — I214 Non-ST elevation (NSTEMI) myocardial infarction: Secondary | ICD-10-CM

## 2011-11-01 DIAGNOSIS — E870 Hyperosmolality and hypernatremia: Secondary | ICD-10-CM

## 2011-11-01 DIAGNOSIS — J45909 Unspecified asthma, uncomplicated: Secondary | ICD-10-CM

## 2011-11-01 DIAGNOSIS — I509 Heart failure, unspecified: Secondary | ICD-10-CM | POA: Insufficient documentation

## 2011-11-01 DIAGNOSIS — Z8639 Personal history of other endocrine, nutritional and metabolic disease: Secondary | ICD-10-CM

## 2011-11-01 DIAGNOSIS — R001 Bradycardia, unspecified: Secondary | ICD-10-CM

## 2011-11-01 DIAGNOSIS — I447 Left bundle-branch block, unspecified: Secondary | ICD-10-CM

## 2011-11-01 DIAGNOSIS — I2589 Other forms of chronic ischemic heart disease: Secondary | ICD-10-CM | POA: Insufficient documentation

## 2011-11-01 DIAGNOSIS — R4182 Altered mental status, unspecified: Secondary | ICD-10-CM

## 2011-11-01 DIAGNOSIS — Z79899 Other long term (current) drug therapy: Secondary | ICD-10-CM | POA: Insufficient documentation

## 2011-11-01 DIAGNOSIS — T888XXA Other specified complications of surgical and medical care, not elsewhere classified, initial encounter: Secondary | ICD-10-CM | POA: Insufficient documentation

## 2011-11-01 DIAGNOSIS — Z7901 Long term (current) use of anticoagulants: Secondary | ICD-10-CM | POA: Insufficient documentation

## 2011-11-01 DIAGNOSIS — I255 Ischemic cardiomyopathy: Secondary | ICD-10-CM

## 2011-11-01 DIAGNOSIS — Z862 Personal history of diseases of the blood and blood-forming organs and certain disorders involving the immune mechanism: Secondary | ICD-10-CM

## 2011-11-01 DIAGNOSIS — Z95810 Presence of automatic (implantable) cardiac defibrillator: Secondary | ICD-10-CM

## 2011-11-01 DIAGNOSIS — Y849 Medical procedure, unspecified as the cause of abnormal reaction of the patient, or of later complication, without mention of misadventure at the time of the procedure: Secondary | ICD-10-CM | POA: Insufficient documentation

## 2011-11-01 DIAGNOSIS — Z9861 Coronary angioplasty status: Secondary | ICD-10-CM

## 2011-11-01 DIAGNOSIS — E876 Hypokalemia: Secondary | ICD-10-CM

## 2011-11-01 DIAGNOSIS — I6529 Occlusion and stenosis of unspecified carotid artery: Secondary | ICD-10-CM

## 2011-11-01 DIAGNOSIS — I5022 Chronic systolic (congestive) heart failure: Secondary | ICD-10-CM

## 2011-11-01 DIAGNOSIS — I4891 Unspecified atrial fibrillation: Principal | ICD-10-CM

## 2011-11-01 DIAGNOSIS — Z7982 Long term (current) use of aspirin: Secondary | ICD-10-CM | POA: Insufficient documentation

## 2011-11-01 DIAGNOSIS — E785 Hyperlipidemia, unspecified: Secondary | ICD-10-CM | POA: Insufficient documentation

## 2011-11-01 DIAGNOSIS — I10 Essential (primary) hypertension: Secondary | ICD-10-CM

## 2011-11-01 DIAGNOSIS — Z853 Personal history of malignant neoplasm of breast: Secondary | ICD-10-CM

## 2011-11-01 DIAGNOSIS — R918 Other nonspecific abnormal finding of lung field: Secondary | ICD-10-CM | POA: Insufficient documentation

## 2011-11-01 LAB — GLUCOSE, CAPILLARY: Glucose-Capillary: 106 mg/dL — ABNORMAL HIGH (ref 70–99)

## 2011-11-01 LAB — PROTIME-INR: Prothrombin Time: 19.5 seconds — ABNORMAL HIGH (ref 11.6–15.2)

## 2011-11-01 MED ORDER — FUROSEMIDE 20 MG PO TABS
20.0000 mg | ORAL_TABLET | Freq: Every day | ORAL | Status: DC
  Administered 2011-11-03: 20 mg via ORAL
  Filled 2011-11-01 (×3): qty 1

## 2011-11-01 MED ORDER — ASPIRIN EC 81 MG PO TBEC
81.0000 mg | DELAYED_RELEASE_TABLET | Freq: Every evening | ORAL | Status: DC
  Administered 2011-11-01 – 2011-11-02 (×2): 81 mg via ORAL
  Filled 2011-11-01 (×3): qty 1

## 2011-11-01 MED ORDER — SODIUM CHLORIDE 0.9 % IJ SOLN: 3.0000 mL | INTRAMUSCULAR | Status: DC | PRN

## 2011-11-01 MED ORDER — NITROGLYCERIN 0.4 MG SL SUBL: 0.4000 mg | SUBLINGUAL_TABLET | SUBLINGUAL | Status: DC | PRN

## 2011-11-01 MED ORDER — WARFARIN - PHYSICIAN DOSING INPATIENT: Freq: Every day | Status: DC

## 2011-11-01 MED ORDER — VANCOMYCIN HCL IN DEXTROSE 1-5 GM/200ML-% IV SOLN
1000.0000 mg | INTRAVENOUS | Status: DC
  Filled 2011-11-01: qty 200

## 2011-11-01 MED ORDER — ATORVASTATIN CALCIUM 40 MG PO TABS
40.0000 mg | ORAL_TABLET | Freq: Every day | ORAL | Status: DC
  Administered 2011-11-01 – 2011-11-02 (×2): 40 mg via ORAL
  Filled 2011-11-01 (×3): qty 1

## 2011-11-01 MED ORDER — ONDANSETRON HCL 4 MG/2ML IJ SOLN: 4.0000 mg | Freq: Four times a day (QID) | INTRAMUSCULAR | Status: DC | PRN

## 2011-11-01 MED ORDER — TRANDOLAPRIL 4 MG PO TABS
4.0000 mg | ORAL_TABLET | Freq: Every day | ORAL | Status: DC
  Administered 2011-11-01 – 2011-11-03 (×3): 4 mg via ORAL
  Filled 2011-11-01 (×3): qty 1

## 2011-11-01 MED ORDER — SODIUM CHLORIDE 0.9 % IR SOLN
80.0000 mg | Status: DC
  Filled 2011-11-01: qty 2

## 2011-11-01 MED ORDER — WARFARIN SODIUM 6 MG PO TABS
6.0000 mg | ORAL_TABLET | Freq: Every day | ORAL | Status: DC
  Administered 2011-11-01 – 2011-11-02 (×2): 6 mg via ORAL
  Filled 2011-11-01 (×4): qty 1

## 2011-11-01 MED ORDER — INSULIN ASPART 100 UNIT/ML ~~LOC~~ SOLN: 0.0000 [IU] | Freq: Three times a day (TID) | SUBCUTANEOUS | Status: DC

## 2011-11-01 MED ORDER — POTASSIUM CHLORIDE CRYS ER 20 MEQ PO TBCR
20.0000 meq | EXTENDED_RELEASE_TABLET | Freq: Every day | ORAL | Status: DC
  Administered 2011-11-02 – 2011-11-03 (×2): 20 meq via ORAL
  Filled 2011-11-01 (×2): qty 1

## 2011-11-01 MED ORDER — METOPROLOL TARTRATE 12.5 MG HALF TABLET
12.5000 mg | ORAL_TABLET | Freq: Two times a day (BID) | ORAL | Status: DC
  Administered 2011-11-01 – 2011-11-03 (×4): 12.5 mg via ORAL
  Filled 2011-11-01 (×5): qty 1

## 2011-11-01 MED ORDER — ADULT MULTIVITAMIN W/MINERALS CH
1.0000 | ORAL_TABLET | Freq: Every day | ORAL | Status: DC
  Administered 2011-11-01 – 2011-11-03 (×3): 1 via ORAL
  Filled 2011-11-01 (×3): qty 1

## 2011-11-01 MED ORDER — CHLORHEXIDINE GLUCONATE 4 % EX LIQD
60.0000 mL | Freq: Once | CUTANEOUS | Status: AC
  Administered 2011-11-01: 4 via TOPICAL
  Filled 2011-11-01 (×2): qty 60

## 2011-11-01 MED ORDER — SODIUM CHLORIDE 0.9 % IV SOLN: 250.0000 mL | INTRAVENOUS | Status: DC | PRN

## 2011-11-01 MED ORDER — ACETAMINOPHEN 325 MG PO TABS: 650.0000 mg | ORAL_TABLET | ORAL | Status: DC | PRN

## 2011-11-01 MED ORDER — CHLORHEXIDINE GLUCONATE 4 % EX LIQD
60.0000 mL | Freq: Once | CUTANEOUS | Status: AC
  Administered 2011-11-02: 4 via TOPICAL

## 2011-11-01 NOTE — Telephone Encounter (Signed)
Message copied by Tarri Fuller on Wed Nov 01, 2011 12:25 PM ------      Message from: [place], [place] T      Created: Tue Oct 31, 2011  4:52 PM       [facility]      Continue with current treatment plan.      [name], PA-C  4:52 PM 10/31/2011

## 2011-11-01 NOTE — Telephone Encounter (Signed)
pt notified about lab results and verbalized understanding today

## 2011-11-01 NOTE — H&P (Signed)
76 year-old woman presenting for hospital admission for ICD implantation tomorrow. She was seen yesterday by [name], PA-C and admission was arranged. She reports no intercurrent illness or complaints since that visit which I have pasted below. I have reviewed the documentation and have no additions to make. We will place admission orders and notify the EP service.  [name], [place] 11/01/2011 2:01 PM Addendum  [address]  [address]  [place], [place] [address]  Phone: [phone]  Fax: [phone]  Date: 10/31/2011  Name: Alicia Guerrero DOB: 1935-09-17 MRN: [id number]  PCP: Illene Regulus, MD  Primary Cardiologist: Dr. Valera Castle  Primary Electrophysiologist: Dr. Sherryl Manges  History of Present Illness:  Alicia Guerrero is a 76 y.o. female who returns for follow up after recent hospitalization.  She has a hx of CAD, s/p multiple PCIs in the past. Last PCI with DES to RCA in setting of NSTEMI in 4/12. Previous EF has been 40%, but normal by other measurements. Other hx includes AFib, HTN, HL, LBBB.  She was admitted 9/10-9/26 with witnessed cardiac arrest. Resuscitation in the field was unsuccessful. Upon arrival to the emergency room, she was in PEA. She was defibrillated multiple times and treated with amiodarone. She eventually had return of spontaneous rhythm. She was treated with cooling protocol. Potassium was very low at 2.1 upon initial presentation. Her cardiac markers did return elevated. She was seen by cardiology as well as electrophysiology. Echocardiogram demonstrated an EF 40-45% and severe inf HK to AK c/w infarction. CT was negative for pulmonary embolism. She eventually underwent LHC 10/09/11 that demonstrated patent stents and high grade lesion in a mod to large OM2. Overall, medical management was felt to be the best course of action (appeared OM2 lesion may have been chronic). Her hospitalization was complicated by difficulty in extubation. She underwent tracheostomy.  She was discharged to skilled nursing facility.  She is now at home. Her tracheostomy tube has been removed. She is overall doing well. She denies chest pain. Her breathing is much improved. She probably describes class II-IIb symptoms. She denies orthopnea, PND or edema. She is increasing her activity steadily.  Labs (9/13): K 4.3, creatinine 0.64, ALT 36, Hgb 11.5  Wt Readings from Last 3 Encounters:   10/31/11  161 lb (73.029 kg)   10/12/11  157 lb 6.4 oz (71.396 kg)   10/12/11  157 lb 6.4 oz (71.396 kg)    Past Medical History   Diagnosis  Date   .  Left bundle branch block    .  Atrial fibrillation    .  CAD (coronary artery disease)      a. s/p stent to RCA 1996; b. s/p cutting balloon PTCA to LAD 2001; c. s/p Cypher DES to LAD 2003; d. NSTEMI 4/12: DES to dRCA; e. LHC 9/13 (after presentation with cardiac arrest and + Nz's for NSTEMI): pLAD 30% ISR, oD1 30%, dLAD 60-70%, pOM1 (small vessel) 90%, oOM2 95% (moderate to large vessel), mCFX 30%, pOM3 30-40%, dRCA stents and pPDA stents patent with 30% ISR => med Rx rec.   .  Ischemic cardiomyopathy      a. EF 40% at cath 4/12 with AL and Inf HK; b. Echo after presentation with cardiac arrest 9/13: EF 40-45% and severe inf HK to AK c/w infarction, PASP 42   .  HLD (hyperlipidemia)    .  HTN (hypertension)    .  Other atopic dermatitis and related conditions    .  Allergic rhinitis due to pollen    .  Asthma    .  Other and unspecified ovarian cyst    .  Personal history of malignant neoplasm of breast    .  Personal history of hyperthyroidism    .  Chronic eczema      hands   .  Hemorrhoids    .  Cancer    .  Bradycardia      a. coreg tapered off 05/2011 => b. Metoprolol restarted after cardiac arrest 09/2011   .  Cardiac arrest  09/2011     a. VT/VF in community; resusc unsuccessful; PEA in ED; multiple defibs => revived; [employer]; c/b by VDRF, cardiogenic shock; LHC with stable anatomy => med Rx; difficult ween from vent =>  s/p trach; d/c to SNF   .  Chronic systolic heart failure     Current Outpatient Prescriptions   Medication  Sig  Dispense  Refill   .  acetaminophen (TYLENOL) 325 MG tablet  Take 2 tablets (650 mg total) by mouth every 4 (four) hours as needed.     Marland Kitchen  aspirin EC 81 MG tablet  Take 81 mg by mouth every evening.     Marland Kitchen  atorvastatin (LIPITOR) 40 MG tablet  Take 40 mg by mouth daily.     .  DiphenhydrAMINE HCl (BENADRYL ALLERGY PO)  Take by mouth. AS NEEDED FOR SLEEP     .  furosemide (LASIX) 40 MG tablet  Take 0.5 tablets (20 mg total) by mouth daily.  30 tablet  6   .  metoprolol tartrate (LOPRESSOR) 12.5 mg TABS  Take 0.5 tablets (12.5 mg total) by mouth 2 (two) times daily.  30 tablet  6   .  Multiple Vitamin (MULTIVITAMIN WITH MINERALS) TABS  Take 1 tablet by mouth daily.     .  nitroGLYCERIN (NITROSTAT) 0.4 MG SL tablet  Place 0.4 mg under the tongue every 5 (five) minutes x 3 doses as needed. Chest pain     .  potassium chloride SA (K-DUR,KLOR-CON) 20 MEQ tablet  Take 20 mEq by mouth daily.     Marland Kitchen  warfarin (COUMADIN) 5 MG tablet  Take 1 tablet (5 mg total) by mouth daily.  30 tablet  6    Allergies:  Allergies   Allergen  Reactions   .  Meperidine Hcl  Other (See Comments)     Mixed with phenergan  swelling of the mouth   .  Penicillins  Other (See Comments)     Unknown reaction   .  Molds & Smuts  Other (See Comments)     Runny nose   .  Tape  Other (See Comments)     Redness, pulls skin off    History   Substance Use Topics   .  Smoking status:  Never Smoker   .  Smokeless tobacco:  Never Used   .  Alcohol Use:  No    ROS: Please see the history of present illness. She has a mild cough with clear sputum production. This is improving. All other systems reviewed and negative.  PHYSICAL EXAM:  VS: BP 170/90  Pulse 62  Ht 5\' 4"  (1.626 m)  Wt 161 lb (73.029 kg)  BMI 27.64 kg/m2  Well nourished, well developed, in no acute distress  HEENT: normal  Neck: no JVD  Cardiac:  normal S1, S2; RRR; no murmur  Lungs: clear to auscultation bilaterally, no wheezing, rhonchi or rales  Abd: soft, nontender, no hepatomegaly  Ext: Trace bilateral LE edema; right wrist without hematoma or bruit  Skin: warm and dry  Neuro: CNs 2-12 intact, no focal abnormalities noted  EKG: NSR, HR 62, LBBB  ASSESSMENT AND PLAN:  1. Ventricular Fibrillation Arrest: She has recovered nicely. As noted, her cardiac catheterization demonstrated fairly stable anatomy. The lesion in the second obtuse marginal appeared to be fairly chronic. She was treated medically. Her ejection fraction was 45% by echocardiogram. She was followed by electrophysiology in the hospital. I reviewed her chart. It does not appear that AICD was recommended. I will review this with Dr. Graciela Husbands.  2. Coronary Artery Disease: Continue aspirin and statin. She is tolerating low-dose beta blocker. Followup with Dr. Daleen Squibb in one month.  3. Hypertension: Most of her medications were held at discharge. I presume this was related to hypotension from cardiogenic shock. She is now hypertensive. She was previously on [name] and amlodipine. I will start by adding [name] 4 mg daily Bactrim medical regimen. Check a basic metabolic panel today and repeat in one week.  4. Hyperlipidemia: Continue statin.  5. Atrial Fibrillation: Maintaining sinus rhythm. She is maintained on Coumadin which is followed by Dr. Christell Constant in [place].  6. Ventilator Dependent Respiratory Failure: Resolved. She had a tracheostomy. The tube has been removed. She will followup with pulmonary as directed.  7. Systolic CHF: Volume appears stable on low-dose Lasix. Check a basic metabolic panel today. She is on beta blocker therapy. ACE inhibitor will be added back.  Luna Glasgow, PA-C 12:17 PM 10/31/2011  ADDENDUM 11/01/2011 1:55 PM  I discussed the patient's case today with Dr. Sherryl Manges. The patient is at high risk for recurrent cardiac arrest. She is not wearing a  LifeVest. Dr. Graciela Husbands d/w Dr. Johney Frame over the phone. We decided her best course of action would be to be admitted to the hospital today. She will need an AICD implanted and this will be done tomorrow with Dr. Hillis Range. I have left a message at the patient's home for her to call back. We will request a bed. She will be sent to the hospital once a bed is available today and our team in the hospital will write orders.  Signed,  [name], PA-C 2:00 PM 11/01/2011   Tonny Bollman 11/01/2011 4:53 PM

## 2011-11-01 NOTE — Progress Notes (Signed)
PHARMACIST - PHYSICIAN COMMUNICATION DR:  Excell Seltzer CONCERNING: Pharmacy Care Issues Regarding Warfarin Labs  RECOMMENDATION (Action Taken): A baseline and daily protime for three days has been ordered to meet the [facility] Patient safety goal and comply with the current [facility] Pharmacy & Therapeutics Committee policy.   The Pharmacy will defer all warfarin dose order changes and follow up of lab results to the prescriber unless an additional order to initiate a "pharmacy Coumadin consult" is placed.  DESCRIPTION:  While hospitalized, to be in compliance with The Joint Commission National Patient Safety Goals, all patients on warfarin must have a baseline and/or current protime prior to the administration of warfarin. Pharmacy has received your order for warfarin without these required laboratory assessments.   Sheppard Coil PharmD.

## 2011-11-01 NOTE — Telephone Encounter (Signed)
HHRN called to report PT INR result PT 22.1 INR 1.8 on 6 mg of coumadin. Orders are need for repeat PT INR checks

## 2011-11-02 ENCOUNTER — Encounter (HOSPITAL_COMMUNITY)
Admission: AD | Disposition: A | Discharge status: Home Health | Payer: Self-pay | Source: Ambulatory Visit | Attending: Internal Medicine

## 2011-11-02 ENCOUNTER — Observation Stay (HOSPITAL_COMMUNITY): Payer: Medicare Other

## 2011-11-02 ENCOUNTER — Encounter: Payer: Self-pay | Admitting: *Deleted

## 2011-11-02 ENCOUNTER — Ambulatory Visit (HOSPITAL_COMMUNITY): Admission: RE | Admit: 2011-11-02 | Payer: Medicare Other | Source: Ambulatory Visit | Admitting: Internal Medicine

## 2011-11-02 DIAGNOSIS — I255 Ischemic cardiomyopathy: Secondary | ICD-10-CM

## 2011-11-02 DIAGNOSIS — Z95810 Presence of automatic (implantable) cardiac defibrillator: Secondary | ICD-10-CM | POA: Insufficient documentation

## 2011-11-02 DIAGNOSIS — I5022 Chronic systolic (congestive) heart failure: Secondary | ICD-10-CM

## 2011-11-02 DIAGNOSIS — I469 Cardiac arrest, cause unspecified: Secondary | ICD-10-CM

## 2011-11-02 HISTORY — PX: BI-VENTRICULAR IMPLANTABLE CARDIOVERTER DEFIBRILLATOR: SHX5459

## 2011-11-02 LAB — PROTIME-INR
INR: 2 — ABNORMAL HIGH (ref 0.00–1.49)
Prothrombin Time: 21.9 seconds — ABNORMAL HIGH (ref 11.6–15.2)

## 2011-11-02 LAB — CBC
MCH: 25.8 pg — ABNORMAL LOW (ref 26.0–34.0)
Platelets: 263 10*3/uL (ref 150–400)
RBC: 4.54 MIL/uL (ref 3.87–5.11)
RDW: 15.1 % (ref 11.5–15.5)

## 2011-11-02 SURGERY — BI-VENTRICULAR IMPLANTABLE CARDIOVERTER DEFIBRILLATOR  (CRT-D)
Anesthesia: LOCAL

## 2011-11-02 MED ORDER — SODIUM CHLORIDE 0.9 % IJ SOLN: 3.0000 mL | INTRAMUSCULAR | Status: DC | PRN

## 2011-11-02 MED ORDER — SODIUM CHLORIDE 0.9 % IV SOLN: 250.0000 mL | INTRAVENOUS | Status: DC | PRN

## 2011-11-02 MED ORDER — SODIUM CHLORIDE 0.9 % IJ SOLN
3.0000 mL | Freq: Two times a day (BID) | INTRAMUSCULAR | Status: DC
  Administered 2011-11-03: 3 mL via INTRAVENOUS

## 2011-11-02 MED ORDER — FENTANYL CITRATE 0.05 MG/ML IJ SOLN
INTRAMUSCULAR | Status: AC
  Filled 2011-11-02: qty 2

## 2011-11-02 MED ORDER — MIDAZOLAM HCL 5 MG/5ML IJ SOLN
INTRAMUSCULAR | Status: AC
  Filled 2011-11-02: qty 5

## 2011-11-02 MED ORDER — VANCOMYCIN HCL IN DEXTROSE 1-5 GM/200ML-% IV SOLN
1000.0000 mg | Freq: Once | INTRAVENOUS | Status: DC
  Filled 2011-11-02 (×2): qty 200

## 2011-11-02 MED ORDER — VANCOMYCIN HCL IN DEXTROSE 1-5 GM/200ML-% IV SOLN
1000.0000 mg | Freq: Once | INTRAVENOUS | Status: DC
  Filled 2011-11-02: qty 200

## 2011-11-02 MED ORDER — ACETAMINOPHEN 325 MG PO TABS
325.0000 mg | ORAL_TABLET | ORAL | Status: DC | PRN
  Administered 2011-11-02: 650 mg via ORAL
  Filled 2011-11-02: qty 2

## 2011-11-02 MED ORDER — VANCOMYCIN HCL IN DEXTROSE 1-5 GM/200ML-% IV SOLN: 1000.0000 mg | INTRAVENOUS | Status: DC

## 2011-11-02 MED ORDER — HYDROCODONE-ACETAMINOPHEN 5-325 MG PO TABS
1.0000 | ORAL_TABLET | ORAL | Status: DC | PRN
Start: 2011-11-02 — End: 2011-11-03
  Administered 2011-11-02 – 2011-11-03 (×2): 1 via ORAL
  Filled 2011-11-02: qty 2
  Filled 2011-11-02: qty 1

## 2011-11-02 MED ORDER — VANCOMYCIN HCL 1000 MG IV SOLR
1000.0000 mg | Freq: Once | INTRAVENOUS | Status: DC
  Filled 2011-11-02: qty 1000

## 2011-11-02 MED ORDER — VANCOMYCIN HCL IN DEXTROSE 1-5 GM/200ML-% IV SOLN
1000.0000 mg | Freq: Two times a day (BID) | INTRAVENOUS | Status: AC
  Administered 2011-11-03: 1000 mg via INTRAVENOUS
  Filled 2011-11-02: qty 200

## 2011-11-02 MED ORDER — LIDOCAINE HCL (PF) 1 % IJ SOLN
INTRAMUSCULAR | Status: AC
  Filled 2011-11-02: qty 30

## 2011-11-02 MED ORDER — LIDOCAINE HCL (PF) 1 % IJ SOLN
INTRAMUSCULAR | Status: AC
  Filled 2011-11-02: qty 60

## 2011-11-02 MED ORDER — HEPARIN (PORCINE) IN NACL 2-0.9 UNIT/ML-% IJ SOLN
INTRAMUSCULAR | Status: AC
  Filled 2011-11-02: qty 500

## 2011-11-02 MED ORDER — ONDANSETRON HCL 4 MG/2ML IJ SOLN: 4.0000 mg | Freq: Four times a day (QID) | INTRAMUSCULAR | Status: DC | PRN

## 2011-11-02 NOTE — Progress Notes (Signed)
   Patient: Alicia Guerrero Date of Encounter: 11/02/2011, 9:10 AM Admit date: 11/01/2011     Subjective  Alicia Guerrero reports an "uncomfortable feeling in my chest" this AM after taking a shower. She states her chest discomfort is difficult to describe. She denies SOB, palpitations, dizziness or syncope.  She reports dypsnea with moderate activity.   Objective  Physical Exam: Vitals: BP 174/68  Pulse 61  Temp 98.3 F (36.8 C) (Oral)  Resp 18  SpO2 97% General: Well developed, well appearing 76 year old female in no acute distress. OP clear Neck: Supple. JVD not elevated. Tach site healing Lungs: Clear bilaterally to auscultation without wheezes, rales, or rhonchi. Breathing is unlabored. Heart: RRR S1 S2 without murmur, rub or gallop.  Abdomen: Soft, non-distended. Extremities: No clubbing or cyanosis. No edema.  Distal pedal pulses are 2+ and equal bilaterally. Neuro: Alert and oriented X 3. Moves all extremities spontaneously. No focal deficits.  Intake/Output: No intake or output data in the 24 hours ending 11/02/11 0910  Inpatient Medications:  . aspirin EC  81 mg Oral QPM  . atorvastatin  40 mg Oral q1800  . chlorhexidine  60 mL Topical Once  . chlorhexidine  60 mL Topical Once  . furosemide  20 mg Oral Daily  . gentamicin irrigation  80 mg Irrigation On Call  . metoprolol tartrate  12.5 mg Oral BID  . multivitamin with minerals  1 tablet Oral Daily  . potassium chloride SA  20 mEq Oral Daily  . trandolapril  4 mg Oral Daily  . vancomycin  1,000 mg Intravenous On Call  . warfarin  6 mg Oral q1800  . Warfarin - Physician Dosing Inpatient   Does not apply q1800  . DISCONTD: insulin aspart  0-15 Units Subcutaneous TID WC   Labs:  Basename 10/31/11 1304  NA 139  K 4.2  CL 103  CO2 29  GLUCOSE 90  BUN 7  CREATININE 0.6  CALCIUM 9.5  MG --  PHOS --    INR 2.0  Radiology/Studies: Dg Chest Port 1 View  10/09/2011  *RADIOLOGY REPORT*  Clinical Data: Short of  breath.  Pulmonary infiltrates.  PORTABLE CHEST - 1 VIEW  Comparison: 10/07/2011.  Findings: The patient is rotated to the right.  Left IJ central line, tracheostomy and enteric tube appear unchanged.  Pulmonary aeration appears unchanged compared yesterday's exam allowing for differences in rotation.  IMPRESSION:  1.  Stable support apparatus. 2.  No change in the pulmonary aeration and with left basilar atelectasis.   Original Report Authenticated By: GEOFFREY LAMKE, M.D.    Dg Chest Port 1 View  10/07/2011  *RADIOLOGY REPORT*  Clinical Data: Tracheostomy tube, evaluate pulmonary infiltrates and lines  PORTABLE CHEST - 1 VIEW  Comparison: 10/06/2011; 10/05/2011; 10/02/2011  Findings: Grossly unchanged enlarged cardiac silhouette and mediastinal contours without chronic calcifications in the aortic arch and descending thoracic aorta.  Stable position of support apparatus.  Minimal improved aeration of the lungs with persistent basilar heterogeneous opacities, left greater than right.  Small left-sided pleural effusion is unchanged.  No pneumothorax. Unchanged bones.  IMPRESSION: 1.  Stable positioning of support apparatus.  No pneumothorax. 2.  Improved aeration of the lungs with persistent basilar opacities, left greater than right, likely atelectasis. 3.  Unchanged small left-sided effusion.   Original Report Authenticated By: JOHN A. WATTS V, M.D.     Telemetry: normal sinus rhythm; one episode of NSVT today (21 beats), ~7:37 AM   Assessment   and Plan  1. VF arrest s/p prolonged hospitalization Sept 2013 - for ICD implant today 2. CAD - continue aspirin, low dose BB and statin 3. HTN - most of her medications were held at discharge due to hypotension; however, she is now hypertensive and [name] was added back to her regimen yesterday; will follow and up-titrate as needed; if BP not better controlled, consider adding amlodipine 4. Dyslipidemia - continue statin 5. AFib - maintaining SR; continue  chronic anticoagulation for embolic prophylaxis  6. Chronic systolic HF - volume appears stable on low-dose Lasix; continue medical therapy with BB and ACEI as tolerated  Dr. Kavya Haag to see and make further recommendations. Signed, EDMISTEN, BROOKE PA-C  I have seen, examined the patient, and reviewed the above assessment and plan.  Changes to above are made where necessary.  Pt successfully resuscitated from Vt/VF arrest.  She had a prolonged hospital coarse. No reversible causes for her arrest were identified.  She has had documented NSVT overnight.  She is certainly at high risk for recurrent arrhythmias and sudden death.  The patient has an ischemic CM (EF 40%), NYHA Class III CHF, and CAD. I agree with Dr Klein that she meets criteria for ICD implantation for secondary prevention of sudden death.  In addition, she has evidence of intrinsic conduction disease with a LBBB.  With CHF, she is likely to benefit from CRT.  Risks, benefits, alternatives to BiV ICD implantation were discussed in detail with the patient today. The patient  understands that the risks include but are not limited to bleeding, infection, pneumothorax, perforation, tamponade, vascular damage, renal failure, MI, stroke, death, inappropriate shocks, and lead dislodgement and wishes to proceed.  We will therefore proceed with device implantation later today.   Co Sign: Gisselle Galvis, MD 11/02/2011 10:21 AM   

## 2011-11-02 NOTE — Progress Notes (Signed)
Patient evaluated for long-term disease management services with [facility] as benefit of her [facility] insurance. At bedside to speak with patient but she is currently in cardiac procedure. Spoke with husband at bedside. Patient will receive a post discharge transition of care call and monthly home visits for assessments and for education.      Raiford Noble MSN-Ed, RN,BSN [facility] Liaison [phone]

## 2011-11-02 NOTE — Telephone Encounter (Signed)
Refer values to coag clinic - they will manage med changes.

## 2011-11-02 NOTE — Op Note (Signed)
SURGEON:  Hillis Range, MD      PREPROCEDURE DIAGNOSES:   1. Aborted sudden cardiac death.   2. New York Heart Association class III, heart failure chronically.   3. Left bundle-branch block.   4. Ischemic Cardiomyopathy  5. NSVT   POSTPROCEDURE DIAGNOSES:   1. Aborted sudden cardiac death.   2. New York Heart Association class III, heart failure chronically.   3. Left bundle-branch block.   4. Ischemic Cardiomyopathy  5. Nonsustained VT     PROCEDURES:    1. Biventricular ICD implantation.  2. Defibrillation threshold testing     INTRODUCTION:  Alicia Guerrero is a 76 y.o. female with recent aborted sudden cardiac arrest.  She required defibrillation in the field for VT/VF.  She had a prolonged hospitalization.  She has an ischemic CM (EF 40%), NYHA Class III CHF, and LBBB QRS morophology. No reversible causes for her arrest were found. She continues to have long runs of NSVT observed on telemetry.  At this time, she meets criteria for ICD implantation for secondary prevention of sudden death.  Given LBBB, the patient may also be expected to benefit from resynchronization therapy.  She therefore  presents today for a biventricular ICD implantation.      DESCRIPTION OF PROCEDURE:  Informed written consent was obtained and the patient was brought to the electrophysiology lab in the fasting state. The patient was adequately sedated with intravenous Versed, and fentanyl as outlined in the nursing report.  The patient's left chest was prepped and draped in the usual sterile fashion by the EP lab staff.  The skin overlying the left deltopectoral region was infiltrated with lidocaine for local analgesia.  A 5-cm incision was made over the left deltopectoral region.  A left subcutaneous defibrillator pocket was fashioned using a combination of sharp and blunt dissection.  Electrocautery was used to assure hemostasis.   Left Upper extremity Venography:  A venogram of the left upper extremity was  attempted however this was unsuccessful due to IV infiltration.  RA/RV Lead Placement: The left subclavian vein was cannulated with fluoroscopic visualization.  Through the left subclavian vein, a St. Jude Medical Tendril STS, model [phone]  (serial [phone] ) right atrial lead and a St. Jude Medical [place], model 4782N-56 (serial number [id number]) right ventricular defibrillator lead were advanced with fluoroscopic visualization into the right atrial appendage and right ventricular apex positions respectively.  Initial atrial lead P-waves measured 1.8 mV with an impedance of 602 ohms and a threshold of 1.9 volts at 0.5 milliseconds.  The right ventricular lead R-wave measured 7 mV with impedance of 505 ohms and a threshold of 0.8 volts at 0.5 milliseconds.   LV Lead Placement: A Medtronic MB-2 guide was advanced through the left axillary vein into the low lateral right atrium.  A Bard curved Damato catheter was introduced through the MB-2 guide and used to cannulate the coronary sinus.  Coronary sinus cannulation was confirmed with hand injection of contrast.  This demonstrated subselection of a posterolateral coronary sinus branch.  This was a very lateral vein.   A Whisper CSJ wire was introduced through the transseptal sheath and advanced into the distal portion of this lateral branch.  A St. Jude Medical Quartet model 1458T - 86 (serial number [id number]) lead was advanced through the MB-2 into the lateral branch.   This was in a very lateral position.  In this location, the left ventricular lead R-waves measured  16 mV with impedance of 1033 ohms and  a threshold of 1.6 volt at 0.5  milliseconds in the bipolar 1-2 configuration with no diaphragmatic  stimulation observed when pacing at 10 volts output.  The MB-2 guide was  therefore removed.    All three leads were secured to the pectoralis  fascia using #2 silk suture over the suture sleeves.  The pocket then irrigated with copious gentamicin  solution.  The leads were then  connected to a [employer] model CD [phone] - [phone] (serial  Number [id number]) biventricular ICD.  The defibrillator was placed into the  pocket.  The pocket was then closed in 2 layers with 2.0 Vicryl suture  for the subcutaneous and subcuticular layers.  Steri-Strips and a  sterile dressing were then applied.   DFT Testing: Defibrillation Threshold testing was then performed. Ventricular fibrillation was induced with a T shock.  Adequate sensing of ventricular  fibrillation was observed with minimal dropout with a programmed sensitivity of 1.60mV.  The patient was successfully defibrillated to sinus rhythm with a single 15 joules shock delivered from the device with an impedance of 70 ohms in a duration of 6 seconds.  The patient remained in sinus rhythm thereafter.  There were no early apparent complications.     CONCLUSIONS:   1. Aborted sudden cardiac death in a patient with an ischemic cardiomyopathy, Left bundle-branch block, ongoing NSVT, and chronic New York Heart Association class III heart failure.   2. Successful biventricular ICD implantation.   3. DFT less than or equal to 15 joules.   4. No early apparent complications.

## 2011-11-02 NOTE — Progress Notes (Signed)
When report called from EP lab, nurse stated that IV in left AC had infiltrated while vancomycin infusing. Left arm largely edematous from elbow to axillary area. Pt c/o pain throughout arm. Dr. Johney Frame aware. IV team called to assess infiltration. IV team suggested arm to be watched 24-48 to make sure no from vanco infiltrate. Emelda Brothers RN

## 2011-11-02 NOTE — Interval H&P Note (Signed)
History and Physical Interval Note:  11/02/2011 10:25 AM  Alicia Guerrero  has presented today for surgery, with the diagnosis of vt/vf  The various methods of treatment have been discussed with the patient and family. After consideration of risks, benefits and other options for treatment, the patient has consented to  Procedure(s) (LRB) with comments: BI-VENTRICULAR IMPLANTABLE CARDIOVERTER DEFIBRILLATOR  (CRT-D) (N/A) as a surgical intervention .  The patient's history has been reviewed, patient examined, no change in status, stable for surgery.  I have reviewed the patient's chart and labs.  Questions were answered to the patient's satisfaction.     Hillis Range

## 2011-11-02 NOTE — H&P (View-Only) (Signed)
Patient: Alicia Guerrero Date of Encounter: 11/02/2011, 9:10 AM Admit date: 11/01/2011     Subjective  Ms. Heinbaugh reports an "uncomfortable feeling in my chest" this AM after taking a shower. She states her chest discomfort is difficult to describe. She denies SOB, palpitations, dizziness or syncope.  She reports dypsnea with moderate activity.   Objective  Physical Exam: Vitals: BP 174/68  Pulse 61  Temp 98.3 F (36.8 C) (Oral)  Resp 18  SpO2 97% General: Well developed, well appearing 76 year old female in no acute distress. OP clear Neck: Supple. JVD not elevated. Tach site healing Lungs: Clear bilaterally to auscultation without wheezes, rales, or rhonchi. Breathing is unlabored. Heart: RRR S1 S2 without murmur, rub or gallop.  Abdomen: Soft, non-distended. Extremities: No clubbing or cyanosis. No edema.  Distal pedal pulses are 2+ and equal bilaterally. Neuro: Alert and oriented X 3. Moves all extremities spontaneously. No focal deficits.  Intake/Output: No intake or output data in the 24 hours ending 11/02/11 0910  Inpatient Medications:  . aspirin EC  81 mg Oral QPM  . atorvastatin  40 mg Oral q1800  . chlorhexidine  60 mL Topical Once  . chlorhexidine  60 mL Topical Once  . furosemide  20 mg Oral Daily  . gentamicin irrigation  80 mg Irrigation On Call  . metoprolol tartrate  12.5 mg Oral BID  . multivitamin with minerals  1 tablet Oral Daily  . potassium chloride SA  20 mEq Oral Daily  . trandolapril  4 mg Oral Daily  . vancomycin  1,000 mg Intravenous On Call  . warfarin  6 mg Oral q1800  . Warfarin - Physician Dosing Inpatient   Does not apply q1800  . DISCONTD: insulin aspart  0-15 Units Subcutaneous TID WC   Labs:  Basename 10/31/11 1304  NA 139  K 4.2  CL 103  CO2 29  GLUCOSE 90  BUN 7  CREATININE 0.6  CALCIUM 9.5  MG --  PHOS --    INR 2.0  Radiology/Studies: Dg Chest Port 1 View  10/09/2011  *RADIOLOGY REPORT*  Clinical Data: Short of  breath.  Pulmonary infiltrates.  PORTABLE CHEST - 1 VIEW  Comparison: 10/07/2011.  Findings: The patient is rotated to the right.  Left IJ central line, tracheostomy and enteric tube appear unchanged.  Pulmonary aeration appears unchanged compared yesterday's exam allowing for differences in rotation.  IMPRESSION:  1.  Stable support apparatus. 2.  No change in the pulmonary aeration and with left basilar atelectasis.   Original Report Authenticated By: Andreas Newport, M.D.    Dg Chest Port 1 View  10/07/2011  *RADIOLOGY REPORT*  Clinical Data: Tracheostomy tube, evaluate pulmonary infiltrates and lines  PORTABLE CHEST - 1 VIEW  Comparison: 10/06/2011; 10/05/2011; 10/02/2011  Findings: Grossly unchanged enlarged cardiac silhouette and mediastinal contours without chronic calcifications in the aortic arch and descending thoracic aorta.  Stable position of support apparatus.  Minimal improved aeration of the lungs with persistent basilar heterogeneous opacities, left greater than right.  Small left-sided pleural effusion is unchanged.  No pneumothorax. Unchanged bones.  IMPRESSION: 1.  Stable positioning of support apparatus.  No pneumothorax. 2.  Improved aeration of the lungs with persistent basilar opacities, left greater than right, likely atelectasis. 3.  Unchanged small left-sided effusion.   Original Report Authenticated By: Waynard Reeds, M.D.     Telemetry: normal sinus rhythm; one episode of NSVT today (21 beats), ~7:37 AM   Assessment  and Plan  1. VF arrest s/p prolonged hospitalization Sept 2013 - for ICD implant today 2. CAD - continue aspirin, low dose BB and statin 3. HTN - most of her medications were held at discharge due to hypotension; however, she is now hypertensive and [name] was added back to her regimen yesterday; will follow and up-titrate as needed; if BP not better controlled, consider adding amlodipine 4. Dyslipidemia - continue statin 5. AFib - maintaining SR; continue  chronic anticoagulation for embolic prophylaxis  6. Chronic systolic HF - volume appears stable on low-dose Lasix; continue medical therapy with BB and ACEI as tolerated  Dr. Johney Frame to see and make further recommendations. Signed, EDMISTEN, BROOKE PA-C  I have seen, examined the patient, and reviewed the above assessment and plan.  Changes to above are made where necessary.  Pt successfully resuscitated from Vt/VF arrest.  She had a prolonged hospital coarse. No reversible causes for her arrest were identified.  She has had documented NSVT overnight.  She is certainly at high risk for recurrent arrhythmias and sudden death.  The patient has an ischemic CM (EF 40%), NYHA Class III CHF, and CAD. I agree with Dr Graciela Husbands that she meets criteria for ICD implantation for secondary prevention of sudden death.  In addition, she has evidence of intrinsic conduction disease with a LBBB.  With CHF, she is likely to benefit from CRT.  Risks, benefits, alternatives to BiV ICD implantation were discussed in detail with the patient today. The patient  understands that the risks include but are not limited to bleeding, infection, pneumothorax, perforation, tamponade, vascular damage, renal failure, MI, stroke, death, inappropriate shocks, and lead dislodgement and wishes to proceed.  We will therefore proceed with device implantation later today.   Co Sign: Hillis Range, MD 11/02/2011 10:21 AM

## 2011-11-03 ENCOUNTER — Ambulatory Visit (HOSPITAL_COMMUNITY): Admission: RE | Admit: 2011-11-03 | Payer: Medicare Other | Source: Ambulatory Visit | Admitting: Internal Medicine

## 2011-11-03 ENCOUNTER — Telehealth: Payer: Self-pay | Admitting: *Deleted

## 2011-11-03 ENCOUNTER — Telehealth: Payer: Self-pay | Admitting: General Practice

## 2011-11-03 ENCOUNTER — Observation Stay (HOSPITAL_COMMUNITY): Payer: Medicare Other

## 2011-11-03 DIAGNOSIS — I469 Cardiac arrest, cause unspecified: Secondary | ICD-10-CM

## 2011-11-03 DIAGNOSIS — J939 Pneumothorax, unspecified: Secondary | ICD-10-CM

## 2011-11-03 LAB — PROTIME-INR
INR: 2.26 — ABNORMAL HIGH (ref 0.00–1.49)
Prothrombin Time: 24 seconds — ABNORMAL HIGH (ref 11.6–15.2)

## 2011-11-03 MED ORDER — WARFARIN SODIUM 5 MG PO TABS: 5.0000 mg | ORAL_TABLET | Freq: Every day | ORAL | Status: DC

## 2011-11-03 MED ORDER — HYDROCODONE-ACETAMINOPHEN 5-325 MG PO TABS: 1.0000 | ORAL_TABLET | Freq: Four times a day (QID) | ORAL | Status: DC | PRN

## 2011-11-03 NOTE — Progress Notes (Signed)
[facility]  Patient Status: Active (receiving services up to time of hospitalization)  [facility] is providing the following services: RN, PT, OT and ST  If patient discharges after hours, please call [phone].   Alicia Guerrero 11/03/2011, 11:56 AM

## 2011-11-03 NOTE — Care Management Note (Signed)
    Page 1 of 2   11/03/2011     11:00:18 AM   CARE MANAGEMENT NOTE 11/03/2011  Patient:  Alicia Guerrero, Alicia Guerrero   Account Number:  [id number]  Date Initiated:  11/03/2011  Documentation initiated by:  GRAVES-BIGELOW,Rasul Decola  Subjective/Objective Assessment:   Pt admitted for Biventricular ICD implantation. Chest xary shows  small pneumothorax. Plan for repeat cxray.     Action/Plan:   CM will continue to monitor for dispositon needs. Pt is currently active with [facility] for  HHPT, OT, ST and RN. MD please resume HH services. CM did make referral  for services.   Anticipated DC Date:  11/03/2011   Anticipated DC Plan:  HOME W HOME HEALTH SERVICES      DC Planning Services  CM consult      [facility] Choice  Resumption Of Svcs/PTA Provider  HOME HEALTH   Choice offered to / List presented to:  C-1 Patient        HH arranged  HH-1 RN  HH-10 DISEASE MANAGEMENT  HH-2 PT  HH-3 OT  HH-5 SPEECH THERAPY      HH agency  [facility]   Status of service:  Completed, signed off Medicare Important Message given?   (If response is "NO", the following Medicare IM given date fields will be blank) Date Medicare IM given:   Date Additional Medicare IM given:    Discharge Disposition:  HOME W HOME HEALTH SERVICES  Per UR Regulation:  Reviewed for med. necessity/level of care/duration of stay  If discussed at Long Length of Stay Meetings, dates discussed:    Comments:

## 2011-11-03 NOTE — Progress Notes (Signed)
Radiology called in result of cxray this Am in regards to pneumothorax. Amber NP updated. New order for repeat cxray ordered.  Ancil Linsey RN

## 2011-11-03 NOTE — Telephone Encounter (Signed)
LMOM to patient to call coumadin clinic to schedule hospital DC f/u appointment/

## 2011-11-03 NOTE — Discharge Summary (Signed)
ELECTROPHYSIOLOGY PROCEDURE DISCHARGE SUMMARY    Patient ID: Alicia Guerrero,  MRN: [id number], DOB/AGE: Jan 19, 1935 76 y.o.  Admit date: 11/01/2011 Discharge date: 11/03/2011  Primary Care Physician: Lucas Mallow, MD Primary Cardiologist: Valera Castle, MD Electrophysiologist: Hillis Range, MD  Primary Discharge Diagnosis:  Aborted sudden cardiac death, LBBB, Class III heart failure status post CRT-D implantation this admission  Secondary Discharge Diagnosis:  1.  Non sustained VT 2.  Coronary artery disease- s/p stent to RCA 1996; b. s/p cutting balloon PTCA to LAD 2001; c. s/p Cypher DES to LAD 2003; d. NSTEMI 4/12: DES to dRCA; e. LHC 9/13 (after presentation with cardiac arrest and + Nz's for NSTEMI): pLAD 30% ISR, oD1 30%, dLAD 60-70%, pOM1 (small vessel) 90%, oOM2 95% (moderate to large vessel), mCFX 30%, pOM3 30-40%, dRCA stents and pPDA stents patent with 30% ISR => med Rx rec. 3.  Ischemic cardiomyopathy 4.  Hyperlipidemia 5.  Hypertension 6.  Atrial fibrillation 7.  Chronic anticoagulation with Warfarin   Procedures This Admission:  1.  Implantation of a cardiac resynchronization therapy defibrillator on 11-02-2011 by Dr Johney Frame.  The patient received a STJ model number [id number] ICD with model number 2088 right atrial lead, 7122Q right ventricular lead, and model number 1458 left ventricular lead. DFT;s were successful at 15J.  There were no early apparent complications.  2.  Chest x-ray on 11-03-2011 demonstrated a possible tiny left apical hydropneumothorax.   Brief HPI: Alicia Guerrero is a 76 y.o. female with recent aborted sudden cardiac arrest. She required defibrillation in the field for VT/VF. She had a prolonged hospitalization. She has an ischemic CM (EF 40%), NYHA Class III CHF, and LBBB QRS morophology. No reversible causes for her arrest were found. She continues to have long runs of NSVT observed on telemetry. At this time, she meets criteria for ICD implantation  for secondary prevention of sudden death. Given LBBB, the patient may also be expected to benefit from resynchronization therapy. She therefore presents today for a biventricular ICD implantation.   Hospital Course:  The patient was admitted and underwent implantation of a cardiac resynchronization therapy defibrillator with details as outlined above.   She was monitored on telemetry overnight which demonstrated sinus rhythm with CRT pacing and a short run of NSVT.  Left chest was without hematoma or ecchymosis.  The device was interrogated and found to be functioning normally.  CXR was obtained and demonstrated a possible tiny hydropneumothorax status post device implantation.  Patient to have repeat CXR on Monday, October 21st to follow up.  Patient was made aware to return to the ER with any problems with shortness of breath or chest pain this weekend.  Wound care, arm mobility, and restrictions were reviewed with the patient.  Dr Johney Frame examined the patient and considered them stable for discharge to home. She had infiltration of her IV during the procedure and some left arm swelling and tenderness on the day of discharge.  Dr Johney Frame felt she should be discharged with Vicodin for home.     Discharge Vitals: Blood pressure 158/80, pulse 72, temperature 97.3 F (36.3 C), temperature source Oral, resp. rate 18, SpO2 95.00%.    Labs:   Lab Results  Component Value Date   WBC 5.8 11/02/2011   HGB 11.7* 11/02/2011   HCT 37.1 11/02/2011   MCV 81.7 11/02/2011   PLT 263 11/02/2011     Lab 10/31/11 1304  NA 139  K 4.2  CL 103  CO2 29  BUN 7  CREATININE 0.6  CALCIUM 9.5  PROT --  BILITOT --  ALKPHOS --  ALT --  AST --  GLUCOSE 90   Lab Results  Component Value Date   CKTOTAL 1385* 09/27/2011   CKMB 60.6* 09/27/2011   TROPONINI 5.77* 09/27/2011     Discharge Medications:    Medication List     As of 11/03/2011  3:19 PM    TAKE these medications         acetaminophen 325 MG  tablet   Commonly known as: TYLENOL   Take 650 mg by mouth every 6 (six) hours as needed. For pain      aspirin EC 81 MG tablet   Take 81 mg by mouth every evening.      atorvastatin 40 MG tablet   Commonly known as: LIPITOR   Take 40 mg by mouth daily.      CALCIUM PO   Take 1 tablet by mouth daily.      diphenhydrAMINE 25 mg capsule   Commonly known as: BENADRYL   Take 25 mg by mouth at bedtime as needed. For sleep      furosemide 40 MG tablet   Commonly known as: LASIX   Take 40 mg by mouth daily.      HYDROcodone-acetaminophen 5-325 MG per tablet   Commonly known as: NORCO/VICODIN   Take 1 tablet by mouth every 6 (six) hours as needed for pain (Do not take with any additional Tylenol.).      metoprolol tartrate 25 MG tablet   Commonly known as: LOPRESSOR   Take 12.5 mg by mouth 2 (two) times daily.      multivitamin with minerals Tabs   Take 1 tablet by mouth daily.      nitroGLYCERIN 0.4 MG SL tablet   Commonly known as: NITROSTAT   Place 0.4 mg under the tongue every 5 (five) minutes x 3 doses as needed. Chest pain      potassium chloride SA 20 MEQ tablet   Commonly known as: K-DUR,KLOR-CON   Take 20 mEq by mouth daily.      trandolapril 4 MG tablet   Commonly known as: [name]   Take 1 tablet (4 mg total) by mouth daily.      warfarin 5 MG tablet   Commonly known as: COUMADIN   Take 1 tablet (5 mg total) by mouth daily.         Disposition:  Discharge Orders    Future Appointments: Provider: Department: Dept Phone: Center:   11/07/2011 10:20 AM [facility] [employer] [facility] [phone] [facility]   11/08/2011 11:30 AM [facility] [phone] [facility]   11/13/2011 10:30 AM [facility] [phone] [facility]   12/20/2011 11:10 AM Beatrice Lecher, PA [facility] [place] [phone] [facility]   02/13/2012 1:45 PM Duke Salvia, MD [facility] [phone] [facility]     08/01/2012 10:00 AM Krista Blue [facility] [phone] None   08/08/2012 9:30 AM Pierce Crane, MD [facility] [phone] None     Future Orders Please Complete By Expires   Diet - low sodium heart healthy      Home Health      Scheduling Instructions:   Resume home health services   Questions: Responses:   To provide the following care/treatments PT    OT    RN    SLP   Increase activity slowly      Discharge instructions  Comments:   Please see post BiV discharge instructions     Follow-up Information    Follow up with [facility] On 11/13/2011. (At 10:30 AM for wound check)    Contact information:   [address]  [address] [place] [address] [phone]      Follow up with Sherryl Manges, MD. On 02/13/2012. (At 1:45 PM)    Contact information:   [address] [place] [address] [phone]      Follow up with [name], PA. On 12/20/2011. (At 11:10 AM)    Contact information:   [address] [place] [address] [phone]      Follow up with Illene Regulus, MD. (For Coumadin management (INR check); their office will call you to schedule this appointment next week)    Contact information:   [address] [place] [address] [phone]          Duration of Discharge Encounter: Greater than 30 minutes including physician time.  Signed, Gypsy Balsam, RN, BSN 11/03/2011, 3:19 PM   Hillis Range, MD

## 2011-11-03 NOTE — Progress Notes (Addendum)
CARDIAC REHAB PHASE I   PRE:  Rate/Rhythm: 76  BP:  Supine:   Sitting: 187/92  Standing:    SaO2: 92 RA  MODE:  Ambulation: 550 ft   POST:  Rate/Rhythem: 72  BP:  Supine:   Sitting: 220/78  Standing:    SaO2: 94 RA 1010-1050 Assisted x 1 to ambulate. Gait steady, pt able to walk 550 feet without c/o. BP up before and after walk. Reported to RN. Pt states that her medication cot off schedule yesterday and that she has BP that is hard to control. Pt to recliner after walk with call light in reach. BP rechecked as pt rested in recliner 203/83,197/83 slowing decreasing with rest.  Alicia Guerrero

## 2011-11-03 NOTE — Progress Notes (Signed)
   ELECTROPHYSIOLOGY ROUNDING NOTE    Patient Name: Alicia Guerrero Date of Encounter: 11-03-2011    SUBJECTIVE:   S/p CRT-D implant 11-02-2011.  Left arm IV infiltrated yesterday, left arm swollen and very sore.  Moderate incisional soreness.  Denies CP or SOB.  TELEMETRY: Reviewed telemetry pt in sinus rhythm with CRT pacing and run of NSVT  Filed Vitals:   11/02/11 1900 11/02/11 2100 11/03/11 0500 11/03/11 0600  BP: 134/71 156/64 179/68 156/72  Pulse: 67 71 63   Temp:  98.2 F (36.8 C) 97.3 F (36.3 C)   TempSrc:  Oral Oral   Resp:      SpO2:  95% 95%    Physical Exam: GEN- The patient is well appearing, alert and oriented x 3 today.   Head- normocephalic, atraumatic Eyes-  Sclera clear, conjunctiva pink Ears- hearing intact Oropharynx- clear Neck- supple, no JVP Lymph- no cervical lymphadenopathy Lungs- Clear to ausculation bilaterally, normal work of breathing Heart- Regular rate and rhythm, no murmurs, rubs or gallops, PMI not laterally displaced GI- soft, NT, ND, + BS Extremities- no clubbing, cyanosis, or edema,  L arm infiltration is tender but without erhythema/ warmth Neuro- strength and sensation are intact ICD pocket without hematoma  LABS: Basic Metabolic Panel:  Basename 10/31/11 1304  NA 139  K 4.2  CL 103  CO2 29  GLUCOSE 90  BUN 7  CREATININE 0.6  CALCIUM 9.5  MG --  PHOS --   CBC:  Basename 11/02/11 1122  WBC 5.8  NEUTROABS --  HGB 11.7*  HCT 37.1  MCV 81.7  PLT 263    Radiology/Studies:  No obvious ptx, stable leads  DEVICE INTERROGATION: Device interrogation reviewed.  Normal device function.  1. S/p aborted cardiac arrest Doing well s/p ICD Wound care, arm mobility, restrictions reviewed with patient. Routine follow up scheduled.  2. Ischemic CM/ CHF- stable  3. L arm infiltration- appears stable Warm compresses Ok to send home with vicodin for 2-3 days  I anticipate DC to home late this afternoon.  Will ask cardiac  rehab to assess this am.  Fayrene Fearing Makalah Asberry,MD

## 2011-11-03 NOTE — Telephone Encounter (Signed)
Patient with tiny left hydropneumothorax status post device implantation per report.  Discussed with Dr Johney Frame.  Patient to have repeat chest x-ray on Monday October 21st at [facility] office.  Patient aware to return to the ER with any problems with shortness of breath or chest pain.

## 2011-11-06 ENCOUNTER — Other Ambulatory Visit: Payer: Self-pay | Admitting: *Deleted

## 2011-11-06 ENCOUNTER — Ambulatory Visit (INDEPENDENT_AMBULATORY_CARE_PROVIDER_SITE_OTHER)
Admission: RE | Admit: 2011-11-06 | Discharge: 2011-11-06 | Disposition: A | Discharge status: Home / Self Care | Payer: Medicare Other | Source: Ambulatory Visit | Attending: Internal Medicine | Admitting: Internal Medicine

## 2011-11-06 ENCOUNTER — Telehealth: Payer: Self-pay | Admitting: Internal Medicine

## 2011-11-06 DIAGNOSIS — J9600 Acute respiratory failure, unspecified whether with hypoxia or hypercapnia: Secondary | ICD-10-CM

## 2011-11-06 DIAGNOSIS — I4891 Unspecified atrial fibrillation: Secondary | ICD-10-CM

## 2011-11-06 DIAGNOSIS — J939 Pneumothorax, unspecified: Secondary | ICD-10-CM

## 2011-11-06 DIAGNOSIS — I2510 Atherosclerotic heart disease of native coronary artery without angina pectoris: Secondary | ICD-10-CM

## 2011-11-06 DIAGNOSIS — J45909 Unspecified asthma, uncomplicated: Secondary | ICD-10-CM

## 2011-11-06 DIAGNOSIS — I10 Essential (primary) hypertension: Secondary | ICD-10-CM

## 2011-11-06 DIAGNOSIS — J9383 Other pneumothorax: Secondary | ICD-10-CM

## 2011-11-06 DIAGNOSIS — E782 Mixed hyperlipidemia: Secondary | ICD-10-CM

## 2011-11-06 DIAGNOSIS — Z8639 Personal history of other endocrine, nutritional and metabolic disease: Secondary | ICD-10-CM

## 2011-11-06 DIAGNOSIS — Z862 Personal history of diseases of the blood and blood-forming organs and certain disorders involving the immune mechanism: Secondary | ICD-10-CM

## 2011-11-06 DIAGNOSIS — I6529 Occlusion and stenosis of unspecified carotid artery: Secondary | ICD-10-CM

## 2011-11-06 NOTE — Telephone Encounter (Signed)
Follow-up:    Wanted to know if it was ok for the patient to use her left arm to pull up her pants during toileting.

## 2011-11-06 NOTE — Telephone Encounter (Signed)
New Problem:    Called into report BP: 164/92 HR:70 Oxy: 97%.  Patient is not wearing the sling for her left arm. Patient is trying not to use her left arm.  Please call back if you have any questions.

## 2011-11-07 ENCOUNTER — Other Ambulatory Visit (INDEPENDENT_AMBULATORY_CARE_PROVIDER_SITE_OTHER): Payer: Medicare Other

## 2011-11-07 DIAGNOSIS — I10 Essential (primary) hypertension: Secondary | ICD-10-CM

## 2011-11-07 DIAGNOSIS — J9600 Acute respiratory failure, unspecified whether with hypoxia or hypercapnia: Secondary | ICD-10-CM

## 2011-11-07 DIAGNOSIS — I6529 Occlusion and stenosis of unspecified carotid artery: Secondary | ICD-10-CM

## 2011-11-07 DIAGNOSIS — I4891 Unspecified atrial fibrillation: Secondary | ICD-10-CM

## 2011-11-07 DIAGNOSIS — I2510 Atherosclerotic heart disease of native coronary artery without angina pectoris: Secondary | ICD-10-CM

## 2011-11-07 DIAGNOSIS — E782 Mixed hyperlipidemia: Secondary | ICD-10-CM

## 2011-11-07 DIAGNOSIS — Z8639 Personal history of other endocrine, nutritional and metabolic disease: Secondary | ICD-10-CM

## 2011-11-07 DIAGNOSIS — Z862 Personal history of diseases of the blood and blood-forming organs and certain disorders involving the immune mechanism: Secondary | ICD-10-CM

## 2011-11-07 DIAGNOSIS — I5022 Chronic systolic (congestive) heart failure: Secondary | ICD-10-CM

## 2011-11-07 DIAGNOSIS — J45909 Unspecified asthma, uncomplicated: Secondary | ICD-10-CM

## 2011-11-07 LAB — BASIC METABOLIC PANEL
BUN: 11 mg/dL (ref 6–23)
Creatinine, Ser: 0.6 mg/dL (ref 0.4–1.2)
GFR: 105.84 mL/min (ref >60.00)
Potassium: 3.9 mEq/L (ref 3.5–5.1)

## 2011-11-07 LAB — CBC WITH DIFFERENTIAL/PLATELET
Basophils Relative: 0.9 % (ref 0.0–3.0)
Eosinophils Absolute: 0.4 10*3/uL (ref 0.0–0.7)
Hemoglobin: 12 g/dL (ref 12.0–15.0)
Lymphs Abs: 1.6 10*3/uL (ref 0.7–4.0)
MCHC: 31.4 g/dL (ref 30.0–36.0)
MCV: 82 fl (ref 78.0–100.0)
Monocytes Absolute: 0.7 10*3/uL (ref 0.1–1.0)
Neutro Abs: 5.2 10*3/uL (ref 1.4–7.7)
RBC: 4.66 Mil/uL (ref 3.87–5.11)

## 2011-11-07 LAB — PROTIME-INR
INR: 2.3 ratio — ABNORMAL HIGH (ref 0.8–1.0)
Prothrombin Time: 24.1 s — ABNORMAL HIGH (ref 10.2–12.4)

## 2011-11-07 LAB — HEPATIC FUNCTION PANEL
Albumin: 3.8 g/dL (ref 3.5–5.2)
Total Protein: 6.8 g/dL (ref 6.0–8.3)

## 2011-11-07 LAB — LIPID PANEL: Cholesterol: 141 mg/dL (ref 0–200)

## 2011-11-07 NOTE — Telephone Encounter (Signed)
Advised [facility] Nurse that it is ok for Alicia Guerrero to use her arm to pull up her pants after toileting as long as her pants aren't heavier than 5 pounds and she is not pulling them up over her head.

## 2011-11-08 ENCOUNTER — Other Ambulatory Visit: Payer: Self-pay | Admitting: Internal Medicine

## 2011-11-08 DIAGNOSIS — I4891 Unspecified atrial fibrillation: Secondary | ICD-10-CM

## 2011-11-09 ENCOUNTER — Other Ambulatory Visit: Payer: Self-pay | Admitting: Cardiology

## 2011-11-09 DIAGNOSIS — I10 Essential (primary) hypertension: Secondary | ICD-10-CM

## 2011-11-09 DIAGNOSIS — I2510 Atherosclerotic heart disease of native coronary artery without angina pectoris: Secondary | ICD-10-CM

## 2011-11-09 DIAGNOSIS — I5022 Chronic systolic (congestive) heart failure: Secondary | ICD-10-CM

## 2011-11-09 MED ORDER — ATORVASTATIN CALCIUM 40 MG PO TABS: 40.0000 mg | ORAL_TABLET | Freq: Every day | ORAL | Status: DC

## 2011-11-09 MED ORDER — POTASSIUM CHLORIDE CRYS ER 20 MEQ PO TBCR: 20.0000 meq | EXTENDED_RELEASE_TABLET | Freq: Every day | ORAL | Status: DC

## 2011-11-09 MED ORDER — FUROSEMIDE 40 MG PO TABS: 40.0000 mg | ORAL_TABLET | Freq: Every day | ORAL | Status: DC

## 2011-11-09 MED ORDER — TRANDOLAPRIL 4 MG PO TABS: 4.0000 mg | ORAL_TABLET | Freq: Every day | ORAL | Status: DC

## 2011-11-09 MED ORDER — METOPROLOL TARTRATE 25 MG PO TABS: 12.5000 mg | ORAL_TABLET | Freq: Two times a day (BID) | ORAL | Status: DC

## 2011-11-10 ENCOUNTER — Ambulatory Visit (INDEPENDENT_AMBULATORY_CARE_PROVIDER_SITE_OTHER): Payer: Medicare Other | Admitting: General Practice

## 2011-11-10 DIAGNOSIS — I4891 Unspecified atrial fibrillation: Secondary | ICD-10-CM

## 2011-11-10 DIAGNOSIS — R0989 Other specified symptoms and signs involving the circulatory and respiratory systems: Secondary | ICD-10-CM

## 2011-11-10 DIAGNOSIS — I2510 Atherosclerotic heart disease of native coronary artery without angina pectoris: Secondary | ICD-10-CM

## 2011-11-10 DIAGNOSIS — Z7901 Long term (current) use of anticoagulants: Secondary | ICD-10-CM

## 2011-11-13 ENCOUNTER — Encounter: Payer: Self-pay | Admitting: Internal Medicine

## 2011-11-13 ENCOUNTER — Ambulatory Visit (INDEPENDENT_AMBULATORY_CARE_PROVIDER_SITE_OTHER): Payer: Medicare Other | Admitting: *Deleted

## 2011-11-13 DIAGNOSIS — I255 Ischemic cardiomyopathy: Secondary | ICD-10-CM

## 2011-11-13 DIAGNOSIS — I2589 Other forms of chronic ischemic heart disease: Secondary | ICD-10-CM

## 2011-11-13 LAB — ICD DEVICE OBSERVATION
AL IMPEDENCE ICD: 462.5 Ohm
AL THRESHOLD: 1 V
ATRIAL PACING ICD: 47 pct
BAMS-0001: 150 {beats}/min
BAMS-0003: 70 {beats}/min
DEVICE MODEL ICD: 7063327
FVT: 0
LV LEAD IMPEDENCE ICD: 350 Ohm
RV LEAD AMPLITUDE: 11.3 mv
RV LEAD THRESHOLD: 0.625 V
TOT-0008: 0
TZON-0005SLOWVT: 6
VENTRICULAR PACING ICD: 98 pct

## 2011-11-13 NOTE — Progress Notes (Signed)
Wound check-ICD 

## 2011-11-13 NOTE — Patient Instructions (Addendum)
Return office visit 02/13/12 @ 1:45pm with Dr. Graciela Husbands .

## 2011-11-16 ENCOUNTER — Telehealth: Payer: Self-pay

## 2011-11-16 ENCOUNTER — Ambulatory Visit (INDEPENDENT_AMBULATORY_CARE_PROVIDER_SITE_OTHER): Payer: Medicare Other | Admitting: General Practice

## 2011-11-16 DIAGNOSIS — I2510 Atherosclerotic heart disease of native coronary artery without angina pectoris: Secondary | ICD-10-CM

## 2011-11-16 DIAGNOSIS — I4891 Unspecified atrial fibrillation: Secondary | ICD-10-CM

## 2011-11-16 DIAGNOSIS — Z7901 Long term (current) use of anticoagulants: Secondary | ICD-10-CM

## 2011-11-16 NOTE — Telephone Encounter (Signed)
HHRN called to report PT INR 39.1 2.9 on 5 mg of Warfarin.

## 2011-11-17 NOTE — Telephone Encounter (Signed)
1. Forward info to PharmD Shutt at [facility] clinic 2. Continue present warfarin dose 3. PT in 3 weeks

## 2011-11-17 NOTE — Telephone Encounter (Signed)
Arline Asp, RN with [facility] dosed pt's INR yesterday.  See Coumadin note for details.

## 2011-11-21 ENCOUNTER — Telehealth: Payer: Self-pay | Admitting: Internal Medicine

## 2011-11-21 NOTE — Telephone Encounter (Signed)
Forward 5 pages from [employer] to Dr. Illene Regulus for review on 11-21-11 ym

## 2011-11-23 ENCOUNTER — Ambulatory Visit (INDEPENDENT_AMBULATORY_CARE_PROVIDER_SITE_OTHER): Payer: Medicare Other | Admitting: General Practice

## 2011-11-23 DIAGNOSIS — I4891 Unspecified atrial fibrillation: Secondary | ICD-10-CM

## 2011-11-23 DIAGNOSIS — I2510 Atherosclerotic heart disease of native coronary artery without angina pectoris: Secondary | ICD-10-CM

## 2011-11-23 DIAGNOSIS — Z7901 Long term (current) use of anticoagulants: Secondary | ICD-10-CM

## 2011-11-23 LAB — POCT INR: INR: 2.4

## 2011-12-20 ENCOUNTER — Telehealth: Payer: Self-pay | Admitting: *Deleted

## 2011-12-20 ENCOUNTER — Encounter: Payer: Self-pay | Admitting: Physician Assistant

## 2011-12-20 ENCOUNTER — Ambulatory Visit (INDEPENDENT_AMBULATORY_CARE_PROVIDER_SITE_OTHER): Payer: Medicare Other | Admitting: Physician Assistant

## 2011-12-20 ENCOUNTER — Ambulatory Visit (INDEPENDENT_AMBULATORY_CARE_PROVIDER_SITE_OTHER): Payer: Medicare Other | Admitting: *Deleted

## 2011-12-20 VITALS — BP 152/78 | HR 72 | Ht 64.0 in | Wt 161.0 lb

## 2011-12-20 DIAGNOSIS — I5022 Chronic systolic (congestive) heart failure: Secondary | ICD-10-CM

## 2011-12-20 DIAGNOSIS — L659 Nonscarring hair loss, unspecified: Secondary | ICD-10-CM

## 2011-12-20 DIAGNOSIS — I2510 Atherosclerotic heart disease of native coronary artery without angina pectoris: Secondary | ICD-10-CM

## 2011-12-20 DIAGNOSIS — I4901 Ventricular fibrillation: Secondary | ICD-10-CM

## 2011-12-20 DIAGNOSIS — Z7901 Long term (current) use of anticoagulants: Secondary | ICD-10-CM

## 2011-12-20 DIAGNOSIS — I10 Essential (primary) hypertension: Secondary | ICD-10-CM

## 2011-12-20 DIAGNOSIS — I4891 Unspecified atrial fibrillation: Secondary | ICD-10-CM

## 2011-12-20 LAB — BASIC METABOLIC PANEL
BUN: 14 mg/dL (ref 6–23)
Calcium: 10 mg/dL (ref 8.4–10.5)
GFR: 98.09 mL/min (ref >60.00)
Glucose, Bld: 113 mg/dL — ABNORMAL HIGH (ref 70–99)
Potassium: 4 mEq/L (ref 3.5–5.1)
Sodium: 141 mEq/L (ref 135–145)

## 2011-12-20 LAB — TSH: TSH: 1.78 u[IU]/mL (ref 0.35–5.50)

## 2011-12-20 MED ORDER — CARVEDILOL 6.25 MG PO TABS: ORAL_TABLET | ORAL | Status: DC

## 2011-12-20 NOTE — Telephone Encounter (Signed)
Message copied by Tarri Fuller on Wed Dec 20, 2011  4:38 PM ------      Message from: [place], [place] T      Created: Wed Dec 20, 2011  4:32 PM       Potassium and kidney function look good.      TSH normal.      Continue with current treatment plan.      [name], PA-C  4:49 PM 11/30/2011

## 2011-12-20 NOTE — Telephone Encounter (Signed)
lmom labs ok 

## 2011-12-20 NOTE — Progress Notes (Addendum)
[address]; [address] [place], [place]  [address] Phone: [phone]; Fax:  [phone]  Date:  12/20/2011   Name:  Alicia Guerrero   DOB:  1935-11-15   MRN:  [id number]  PCP:  Illene Regulus, MD  Primary Cardiologist:  Dr. Valera Castle  Primary Electrophysiologist:  Dr. Sherryl Manges   History of Present Illness: Alicia Guerrero is a 76 y.o. female who returns for follow up.    She has a hx of CAD, s/p multiple PCIs in the past. Last PCI with DES to RCA in setting of NSTEMI in 4/12.  Previous EF 40%, but normal by other measurements. Other hx includes AFib, HTN, HL, LBBB.    She was admitted 9/10-9/26 with witnessed cardiac arrest requiring defib multiple times. Echocardiogram demonstrated an EF 40-45% and severe inf HK to AK c/w infarction. CT was negative for pulmonary embolism. LHC 10/09/11 that demonstrated patent stents and high grade lesion in a mod to large OM2. Overall, medical management was felt to be the best course of action (appeared OM2 lesion may have been chronic). Her hospitalization was complicated by difficulty in extubation and she underwent tracheostomy.  I saw her in follow up 10/16.  Discussed case with Dr. Sherryl Manges and we felt she required ICD implantation.  She was sent to the hospital and underwent CRT-D [facility] Jude) with Dr. Hillis Range.  She was noted to have NSVT on tele.  Patient did have possible hydropneumothorax after device implantation. Repeat chest x-ray was unremarkable without evidence of pneumothorax.  Since d/c, she is doing well.   The patient denies chest pain, shortness of breath, syncope, orthopnea, PND or significant pedal edema.   Labs (9/13):    K 4.3, creatinine 0.64, ALT 36, Hgb 11.5 Labs (10/13):  K 3.9, creatinine 0.6, ALT 17, Hgb 12  Wt Readings from Last 3 Encounters:  12/20/11 161 lb (73.029 kg)  10/31/11 161 lb (73.029 kg)  10/12/11 157 lb 6.4 oz (71.396 kg)     Past Medical History  Diagnosis Date  . Left bundle  branch block   . Atrial fibrillation   . CAD (coronary artery disease)     a. s/p stent to RCA 1996;  b. s/p cutting balloon PTCA to LAD 2001; c. s/p Cypher DES to LAD 2003;  d. NSTEMI 4/12: DES to dRCA;  e.  LHC 9/13 (after presentation with cardiac arrest and + Nz's for NSTEMI): pLAD 30% ISR, oD1 30%, dLAD 60-70%, pOM1 (small vessel) 90%, oOM2 95% (moderate to large vessel), mCFX 30%, pOM3 30-40%, dRCA stents and pPDA stents patent with 30% ISR => med Rx rec.   . Ischemic cardiomyopathy     a. EF 40% at cath 4/12 with AL and Inf HK;  b.  Echo after presentation with cardiac arrest 9/13:  EF 40-45% and severe inf HK to AK c/w infarction, PASP 42  . HLD (hyperlipidemia)   . HTN (hypertension)   . Other atopic dermatitis and related conditions   . Allergic rhinitis due to pollen   . Asthma   . Other and unspecified ovarian cyst   . Personal history of malignant neoplasm of breast   . Personal history of hyperthyroidism   . Chronic eczema     hands  . Hemorrhoids   . Cancer   . Bradycardia     a. coreg tapered off 05/2011 => b. Metoprolol restarted after cardiac arrest 09/2011  . Cardiac arrest 09/2011    a. VT/VF  in community; resusc unsuccessful;  PEA in ED; multiple defibs => revived;  [employer];  c/b by VDRF, cardiogenic shock;  LHC with stable anatomy => med Rx;  difficult ween from vent => s/p trach;  d/c to SNF  . Chronic systolic heart failure   . Biventricular ICD (implantable cardiac defibrillator) in place     St Jude 10/2011 (implant MD: Allred)    Current Outpatient Prescriptions  Medication Sig Dispense Refill  . acetaminophen (TYLENOL) 325 MG tablet Take 650 mg by mouth every 6 (six) hours as needed. For pain      . aspirin EC 81 MG tablet Take 81 mg by mouth every evening.       Marland Kitchen atorvastatin (LIPITOR) 40 MG tablet Take 1 tablet (40 mg total) by mouth daily.  90 tablet  1  . CALCIUM PO Take 1 tablet by mouth daily.      . furosemide (LASIX) 40 MG tablet Take 1  tablet (40 mg total) by mouth daily.  90 tablet  1  . metoprolol tartrate (LOPRESSOR) 25 MG tablet Take 0.5 tablets (12.5 mg total) by mouth 2 (two) times daily.  90 tablet  1  . Multiple Vitamin (MULTIVITAMIN WITH MINERALS) TABS Take 1 tablet by mouth daily.      . nitroGLYCERIN (NITROSTAT) 0.4 MG SL tablet Place 0.4 mg under the tongue every 5 (five) minutes x 3 doses as needed. Chest pain      . potassium chloride SA (K-DUR,KLOR-CON) 20 MEQ tablet Take 1 tablet (20 mEq total) by mouth daily.  90 tablet  1  . trandolapril ([name]) 4 MG tablet Take 1 tablet (4 mg total) by mouth daily.  90 tablet  1  . warfarin (COUMADIN) 5 MG tablet Take 1 tablet (5 mg total) by mouth daily.  30 tablet  6  . diphenhydrAMINE (BENADRYL) 25 mg capsule Take 25 mg by mouth at bedtime as needed. For sleep      . HYDROcodone-acetaminophen (NORCO/VICODIN) 5-325 MG per tablet Take 1 tablet by mouth every 6 (six) hours as needed for pain (Do not take with any additional Tylenol.).  8 tablet  0    Allergies: Allergies  Allergen Reactions  . Meperidine Hcl Swelling    Mixed with phenergan swelling of the mouth   . Penicillins Other (See Comments)    Pt has taken penicillin about 6 years ago with no side effects  . Molds & Smuts Other (See Comments)    Runny nose  . Tape Other (See Comments)    Redness, pulls skin off   Social History:   The patient  reports that she has never smoked. She has never used smokeless tobacco. She reports that she does not drink alcohol or use illicit drugs.  ROS:  Please see the history of present illness.  Patient notes significant hair loss.  All other systems reviewed and negative.   PHYSICAL EXAM: VS:  BP 152/78  Pulse 72  Ht 5\' 4"  (1.626 m)  Wt 161 lb (73.029 kg)  BMI 27.64 kg/m2  SpO2 97% Well nourished, well developed, in no acute distress HEENT: normal Neck: no JVD Cardiac:  normal S1, S2; RRR; no murmur Lungs:  clear to auscultation bilaterally, no wheezing, rhonchi  or rales Abd: soft, nontender, no hepatomegaly Ext: no edema  Skin: warm and dry Neuro:  CNs 2-12 intact, no focal abnormalities noted  EKG:  V. paced, HR 72  ASSESSMENT AND PLAN:  1.  Ventricular Fibrillation Arrest:  She is now status post CRT-ICD. She is doing well. She has not had any ICD therapies. She will keep her followup appointment with Dr. Graciela Husbands as scheduled.  2. Coronary Artery Disease:  No angina. Continue aspirin and statin.  3. Hypertension: Blood pressure somewhat elevated. I will adjust her beta blocker as noted below.  4. Hyperlipidemia: Continue statin.  5. Atrial Fibrillation: Maintaining sinus rhythm. She is maintained on Coumadin which is followed by her primary care doctor.  6. Chronic Systolic CHF: Volume is stable. Continue current therapy. Obtain basic metabolic panel today.  7. Alopecia:   She was previously on carvedilol. She is now on metoprolol. She tolerated carvedilol well. It is remotely possible that metoprolol may be contributing to hair loss. I will check a TSH today. I will have her stop her metoprolol and start back on carvedilol 3.125 mg twice daily for 3 days, then increase to 6.25 mg twice daily.  8. Disposition:   Follow up with Dr. Valera Castle in 4-5 mos.  Signed, [name], PA-C  11:47 AM 12/20/2011

## 2011-12-20 NOTE — Patient Instructions (Addendum)
STOP METOPROLOL   START COREG 6.25 MG TABLET; FOR THE 1ST 3 DAYS YOU ARE TO TAKE 1/2 TABLET TWICE DAILY THEN INCREASE TO 1 TABLET TWICE DAILY, RX SENT TO [facility] TODAY; IF YOUR SYSTOLIC BLOOD PRESSURE (TOP NUMBER) IS STAYING BELOW 100 AFTER YOU START THE 1 WHOLE TABLET TWICE DAILY THEN IT IS OK TO GO BACK TO THE 1/2 TABLET TWICE DAILY PER SCOTT WEAVER, PAC.  Your physician recommends that you schedule a follow-up appointment in: 4-5 MONTHS WITH DR. WALL  LAB TODAY BMET, TSH

## 2012-01-12 ENCOUNTER — Telehealth: Payer: Self-pay | Admitting: Cardiology

## 2012-01-12 ENCOUNTER — Telehealth: Payer: Self-pay | Admitting: *Deleted

## 2012-01-12 LAB — HM DEXA SCAN

## 2012-01-12 NOTE — Telephone Encounter (Signed)
Walk in patient form addressing a medical concern. Dr. Daleen Squibb is out of the office. The form was taken to Triage. A copy was retained for KM.

## 2012-01-12 NOTE — Telephone Encounter (Signed)
Pt calling b/c she is still experiencing severe hair loss. Even with the lopressor being discontinued. States this started when she began taking coumadin.  Alopecia is one of the adverse reactions listed with coumadin. She will discuss this at her next coumadin clinic visit on 01/19/11. Reassurance given.  Mylo Red RN

## 2012-01-14 NOTE — Telephone Encounter (Signed)
May also be related to stress brought on by recent severe illness. If so, this will take time to improve. [name], PA-C  2:44 PM 01/14/2012

## 2012-01-19 ENCOUNTER — Ambulatory Visit (INDEPENDENT_AMBULATORY_CARE_PROVIDER_SITE_OTHER): Payer: Medicare Other | Admitting: General Practice

## 2012-01-19 DIAGNOSIS — I2510 Atherosclerotic heart disease of native coronary artery without angina pectoris: Secondary | ICD-10-CM

## 2012-01-19 DIAGNOSIS — Z7901 Long term (current) use of anticoagulants: Secondary | ICD-10-CM

## 2012-01-19 DIAGNOSIS — I4891 Unspecified atrial fibrillation: Secondary | ICD-10-CM

## 2012-01-23 ENCOUNTER — Encounter (INDEPENDENT_AMBULATORY_CARE_PROVIDER_SITE_OTHER): Payer: Self-pay

## 2012-02-13 ENCOUNTER — Encounter: Payer: Medicare Other | Admitting: Internal Medicine

## 2012-02-15 ENCOUNTER — Ambulatory Visit (INDEPENDENT_AMBULATORY_CARE_PROVIDER_SITE_OTHER): Payer: Medicare Other | Admitting: Pharmacist

## 2012-02-15 ENCOUNTER — Encounter: Payer: Self-pay | Admitting: Internal Medicine

## 2012-02-15 ENCOUNTER — Ambulatory Visit (INDEPENDENT_AMBULATORY_CARE_PROVIDER_SITE_OTHER): Payer: Medicare Other | Admitting: Internal Medicine

## 2012-02-15 VITALS — BP 138/73 | HR 68 | Ht 64.0 in | Wt 161.0 lb

## 2012-02-15 DIAGNOSIS — I2589 Other forms of chronic ischemic heart disease: Secondary | ICD-10-CM

## 2012-02-15 DIAGNOSIS — Z95810 Presence of automatic (implantable) cardiac defibrillator: Secondary | ICD-10-CM

## 2012-02-15 DIAGNOSIS — Z7901 Long term (current) use of anticoagulants: Secondary | ICD-10-CM

## 2012-02-15 DIAGNOSIS — T82198A Other mechanical complication of other cardiac electronic device, initial encounter: Secondary | ICD-10-CM | POA: Insufficient documentation

## 2012-02-15 DIAGNOSIS — I4891 Unspecified atrial fibrillation: Secondary | ICD-10-CM

## 2012-02-15 DIAGNOSIS — I4901 Ventricular fibrillation: Secondary | ICD-10-CM

## 2012-02-15 DIAGNOSIS — I255 Ischemic cardiomyopathy: Secondary | ICD-10-CM

## 2012-02-15 DIAGNOSIS — I5022 Chronic systolic (congestive) heart failure: Secondary | ICD-10-CM

## 2012-02-15 DIAGNOSIS — R0989 Other specified symptoms and signs involving the circulatory and respiratory systems: Secondary | ICD-10-CM

## 2012-02-15 DIAGNOSIS — I2510 Atherosclerotic heart disease of native coronary artery without angina pectoris: Secondary | ICD-10-CM

## 2012-02-15 LAB — ICD DEVICE OBSERVATION
AL IMPEDENCE ICD: 412.5 Ohm
ATRIAL PACING ICD: 78 pct
BAMS-0001: 150 {beats}/min
BAMS-0003: 70 {beats}/min
DEV-0020ICD: NEGATIVE
LV LEAD IMPEDENCE ICD: 462.5 Ohm
LV LEAD THRESHOLD: 2.25 V
LV LEAD THRESHOLD: 2.875 V
MODE SWITCH EPISODES: 7
RV LEAD THRESHOLD: 0.625 V
TOT-0006: 20131017000000
TOT-0007: 1
TOT-0008: 0
VENTRICULAR PACING ICD: 97 pct

## 2012-02-15 NOTE — Assessment & Plan Note (Signed)
No intercurrent Ventricular tachycardia  

## 2012-02-15 NOTE — Assessment & Plan Note (Signed)
The patient's device was interrogated and the information was fully reviewed.  The device was reprogrammed to  Maximize longevity. She is evidence of diaphragmatic stimulation. Her device was reprogrammed a little bit today. She says is not to noxious. She'll let us know if it gets worse.

## 2012-02-15 NOTE — Assessment & Plan Note (Signed)
No intercurrent afib  Will continue to monitor

## 2012-02-15 NOTE — Assessment & Plan Note (Signed)
euvloemic  Continue curretn meds

## 2012-02-15 NOTE — Patient Instructions (Signed)
Your physician wants you to follow-up in: October 2014 with Dr. Graciela Husbands. You will receive a reminder letter in the mail two months in advance. If you don't receive a letter, please call our office to schedule the follow-up appointment.  Your physician recommends that you continue on your current medications as directed. Please refer to the Current Medication list given to you today.

## 2012-02-15 NOTE — Assessment & Plan Note (Signed)
As noted

## 2012-02-15 NOTE — Progress Notes (Signed)
Patient Care Team: Jacques Navy, MD as PCP - General   HPI  Alicia Guerrero is a 77 y.o. female Seen in followup for cardiac arrest in the context of coronary artery disease and left bundle branch block. This occurred in the context of coronary artery disease. Her troponins were borderline elevated. There was a prolonged windows and she did not go immediately catheterization. Because of persistent left ventricular dysfunction and no clear trigger for the event he successfully underwent CRT-D implantation. 10/13 by Dr. Velta Addison some beter  Some diaghragmatic stimulation   Past Medical History  Diagnosis Date  . Left bundle branch block   . Atrial fibrillation   . CAD (coronary artery disease)     a. s/p stent to RCA 1996;  b. s/p cutting balloon PTCA to LAD 2001; c. s/p Cypher DES to LAD 2003;  d. NSTEMI 4/12: DES to dRCA;  e.  LHC 9/13 (after presentation with cardiac arrest and + Nz's for NSTEMI): pLAD 30% ISR, oD1 30%, dLAD 60-70%, pOM1 (small vessel) 90%, oOM2 95% (moderate to large vessel), mCFX 30%, pOM3 30-40%, dRCA stents and pPDA stents patent with 30% ISR => med Rx rec.   . Ischemic cardiomyopathy     a. EF 40% at cath 4/12 with AL and Inf HK;  b.  Echo after presentation with cardiac arrest 9/13:  EF 40-45% and severe inf HK to AK c/w infarction, PASP 42  . HLD (hyperlipidemia)   . HTN (hypertension)   . Other atopic dermatitis and related conditions   . Allergic rhinitis due to pollen   . Asthma   . Other and unspecified ovarian cyst   . Personal history of malignant neoplasm of breast   . Personal history of hyperthyroidism   . Chronic eczema     hands  . Hemorrhoids   . Cancer   . Bradycardia     a. coreg tapered off 05/2011 => b. Metoprolol restarted after cardiac arrest 09/2011  . Cardiac arrest 09/2011    a. VT/VF in community; resusc unsuccessful;  PEA in ED; multiple defibs => revived;  [employer];  c/b by VDRF, cardiogenic shock;  LHC with stable  anatomy => med Rx;  difficult ween from vent => s/p trach;  d/c to SNF  . Chronic systolic heart failure   . Biventricular ICD (implantable cardiac defibrillator) in place     St Jude 10/2011 (implant MD: Allred)    Past Surgical History  Procedure Date  . Oophorectomy   . Abdominal hysterectomy   . Ptca   . Cholecystectomy   . Coronary stent placement may and  july 2012  . Breast lumpectomy 08/24/2003    right lumpectomy+sln,T2N0,ERPR+,Her2-  . Polypectomy   . Colonoscopy   . Video bronchoscopy 10/26/2011    Procedure: VIDEO BRONCHOSCOPY WITHOUT FLUORO;  Surgeon: Nelda Bucks, MD;  Location: [facility];  Service: Endoscopy;  Laterality: Bilateral;    Current Outpatient Prescriptions  Medication Sig Dispense Refill  . acetaminophen (TYLENOL) 325 MG tablet Take 650 mg by mouth every 6 (six) hours as needed. For pain      . aspirin EC 81 MG tablet Take 81 mg by mouth every evening.       Marland Kitchen atorvastatin (LIPITOR) 40 MG tablet Take 1 tablet (40 mg total) by mouth daily.  90 tablet  1  . CALCIUM PO Take 1 tablet by mouth daily.      . carvedilol (COREG) 6.25 MG tablet  FOR THE 1ST 3 DAYS TAKE 1/2 TABLET TWICE DAILY THEN INCREASE TO 1 TABLET TWICE DAILY  90 tablet  11  . diphenhydrAMINE (BENADRYL) 25 mg capsule Take 25 mg by mouth at bedtime as needed. For sleep      . furosemide (LASIX) 40 MG tablet Take 1 tablet (40 mg total) by mouth daily.  90 tablet  1  . HYDROcodone-acetaminophen (NORCO/VICODIN) 5-325 MG per tablet Take 1 tablet by mouth every 6 (six) hours as needed for pain (Do not take with any additional Tylenol.).  8 tablet  0  . Multiple Vitamin (MULTIVITAMIN WITH MINERALS) TABS Take 1 tablet by mouth daily.      . nitroGLYCERIN (NITROSTAT) 0.4 MG SL tablet Place 0.4 mg under the tongue every 5 (five) minutes x 3 doses as needed. Chest pain      . potassium chloride SA (K-DUR,KLOR-CON) 20 MEQ tablet Take 1 tablet (20 mEq total) by mouth daily.  90 tablet  1  .  trandolapril ([name]) 4 MG tablet Take 1 tablet (4 mg total) by mouth daily.  90 tablet  1  . warfarin (COUMADIN) 5 MG tablet Take 1 tablet (5 mg total) by mouth daily.  30 tablet  6    Allergies  Allergen Reactions  . Meperidine Hcl Swelling    Mixed with phenergan swelling of the mouth   . Penicillins Other (See Comments)    Pt has taken penicillin about 6 years ago with no side effects  . Molds & Smuts Other (See Comments)    Runny nose  . Tape Other (See Comments)    Redness, pulls skin off    Review of Systems negative except from HPI and PMH  Physical Exam BP 138/73  Pulse 68  Ht 5\' 4"  (1.626 m)  Wt 161 lb (73.029 kg)  BMI 27.64 kg/m2 Well developed and nourished in no acute distress HENT normal Neck supple with JVP-flat Carotids brisk and full without bruits Clear Device pocket well healed; without hematoma or erythema Regular rate and rhythm, no murmurs or gallops Abd-soft with active BS without hepatomegaly No Clubbing cyanosis edema Skin-warm and dry A & Oriented  Grossly normal sensory and motor function    Assessment and  Plan

## 2012-02-22 ENCOUNTER — Telehealth: Payer: Self-pay | Admitting: Internal Medicine

## 2012-02-22 NOTE — Telephone Encounter (Signed)
Pt has received the remote box and Okey Regal would like to know if she can set this up for her next week and she wants to know what she needs to do

## 2012-02-22 NOTE — Telephone Encounter (Signed)
Spoke w/Alicia Guerrero and instructed on how to hook up [employer]. Pt to hook up on Monday (2-10). Will call if have any problems.

## 2012-02-28 ENCOUNTER — Telehealth: Payer: Self-pay | Admitting: Cardiology

## 2012-02-28 ENCOUNTER — Ambulatory Visit: Payer: Medicare Other | Admitting: Cardiology

## 2012-02-28 NOTE — Telephone Encounter (Signed)
I spoke with pt husband and have rescheduled her appointment to 03/22/12.  He stated they had been having difficulty with the [employer] transmissions. But that pt was doing much better since last hospitalization. Mylo Red RN

## 2012-02-28 NOTE — Telephone Encounter (Signed)
New Problem    Pt had to reschedule her appt due to the weather. Her appt with Dr. Daleen Squibb got pushed out to April and she is concerned that may be too far out. Would like to speak to a nurse about this.

## 2012-03-22 ENCOUNTER — Ambulatory Visit: Payer: Medicare Other | Admitting: Cardiology

## 2012-04-01 ENCOUNTER — Telehealth: Payer: Self-pay | Admitting: *Deleted

## 2012-04-01 MED ORDER — FUROSEMIDE 40 MG PO TABS: 40.0000 mg | ORAL_TABLET | Freq: Every day | ORAL | Status: DC

## 2012-04-01 MED ORDER — POTASSIUM CHLORIDE CRYS ER 20 MEQ PO TBCR: 20.0000 meq | EXTENDED_RELEASE_TABLET | Freq: Every day | ORAL | Status: DC

## 2012-04-01 NOTE — Telephone Encounter (Signed)
It looks like she was placed on Coreg 6.25 mg bid. [name], PA-C  3:40 PM 04/01/2012

## 2012-04-01 NOTE — Telephone Encounter (Signed)
Alicia Guerrero said that her home health nurse increased Carvedilol to 2 pills twice daily. I have no record of this so I will send this to Danielle Rankin to ask [name] about this since he originally did the prescription and Dr Graciela Husbands nor Dr Daleen Squibb is in the office today.  Please send script in to [facility] in [place].

## 2012-04-01 NOTE — Telephone Encounter (Signed)
[name] RMA s/w HHRN earlier today about coreg was increased to 2 tabs bid. [name] asked for me to s/w [facility] since [place] W. PA was the provider who ordered coreg see SW, PA ov note. lm for cb for [facility] Okey Regal Spinks who increased coreg?

## 2012-04-01 NOTE — Telephone Encounter (Signed)
[name] RMA s/w HHRN earlier today about coreg was increased to 2 tabs bid. [name] asked for me to s/w HHRN since [name] PA was the provider who ordered coreg see SW, PA ov note. lm for cb for HHRN Alicia Guerrero who increased coreg? 

## 2012-04-02 ENCOUNTER — Other Ambulatory Visit: Payer: Self-pay

## 2012-04-02 DIAGNOSIS — I10 Essential (primary) hypertension: Secondary | ICD-10-CM

## 2012-04-02 DIAGNOSIS — I5022 Chronic systolic (congestive) heart failure: Secondary | ICD-10-CM

## 2012-04-02 NOTE — Telephone Encounter (Signed)
Per Guy Sandifer, she increased patient's coreg to 12.5 mg bid.  Medication list was changed to reflect this.

## 2012-04-03 ENCOUNTER — Ambulatory Visit: Payer: Medicare Other | Admitting: Physician Assistant

## 2012-04-06 ENCOUNTER — Telehealth: Payer: Self-pay | Admitting: Oncology

## 2012-04-06 NOTE — Telephone Encounter (Signed)
S/W THE PT AND SHE IS AWARE OF THE REASSIGMENT FROM DR RUBIN TO DR Darnelle Catalan. PT AWARE SHE WILL RECEIVE A LETTER ALONG WITH THE APPT CALENDAR FOR ANY CHANGES.

## 2012-04-13 ENCOUNTER — Encounter: Payer: Self-pay | Admitting: Oncology

## 2012-04-18 ENCOUNTER — Encounter: Payer: Self-pay | Admitting: Physician Assistant

## 2012-04-18 ENCOUNTER — Ambulatory Visit (INDEPENDENT_AMBULATORY_CARE_PROVIDER_SITE_OTHER): Payer: Medicare Other | Admitting: Physician Assistant

## 2012-04-18 ENCOUNTER — Ambulatory Visit (INDEPENDENT_AMBULATORY_CARE_PROVIDER_SITE_OTHER): Payer: Medicare Other | Admitting: *Deleted

## 2012-04-18 VITALS — BP 192/90 | HR 60 | Ht 63.0 in | Wt 164.4 lb

## 2012-04-18 DIAGNOSIS — I4891 Unspecified atrial fibrillation: Secondary | ICD-10-CM

## 2012-04-18 DIAGNOSIS — Z7901 Long term (current) use of anticoagulants: Secondary | ICD-10-CM

## 2012-04-18 DIAGNOSIS — I2510 Atherosclerotic heart disease of native coronary artery without angina pectoris: Secondary | ICD-10-CM

## 2012-04-18 DIAGNOSIS — I5022 Chronic systolic (congestive) heart failure: Secondary | ICD-10-CM

## 2012-04-18 DIAGNOSIS — Z95810 Presence of automatic (implantable) cardiac defibrillator: Secondary | ICD-10-CM

## 2012-04-18 DIAGNOSIS — I10 Essential (primary) hypertension: Secondary | ICD-10-CM

## 2012-04-18 DIAGNOSIS — E782 Mixed hyperlipidemia: Secondary | ICD-10-CM

## 2012-04-18 LAB — POCT INR: INR: 1.8

## 2012-04-18 MED ORDER — TRANDOLAPRIL 4 MG PO TABS: 4.0000 mg | ORAL_TABLET | Freq: Every day | ORAL | Status: DC

## 2012-04-18 MED ORDER — FUROSEMIDE 40 MG PO TABS: 40.0000 mg | ORAL_TABLET | Freq: Every day | ORAL | Status: DC

## 2012-04-18 MED ORDER — CARVEDILOL 25 MG PO TABS: 25.0000 mg | ORAL_TABLET | Freq: Two times a day (BID) | ORAL | Status: DC

## 2012-04-18 MED ORDER — POTASSIUM CHLORIDE CRYS ER 20 MEQ PO TBCR: 20.0000 meq | EXTENDED_RELEASE_TABLET | Freq: Every day | ORAL | Status: DC

## 2012-04-18 MED ORDER — ATORVASTATIN CALCIUM 40 MG PO TABS: 40.0000 mg | ORAL_TABLET | Freq: Every day | ORAL | Status: DC

## 2012-04-18 MED ORDER — WARFARIN SODIUM 5 MG PO TABS: ORAL_TABLET | ORAL | Status: DC

## 2012-04-18 NOTE — Patient Instructions (Addendum)
INCREASE COREG TO 25 MG TWICE DAILY; NEW RX SENT IN TO [facility] TODAY  REFILLS SENT TO [facility] FOR  LASIX, POTASSIUM, [name], LIPITOR  PLEASE FOLLOW UP WITH SCOTT WEAVER, PAC MAY 12 @ 11:30

## 2012-04-18 NOTE — Progress Notes (Signed)
[address], [address] [place], [place]  [address] Phone: [phone] Fax:  [phone]  Date:  04/18/2012   ID:  ATHA MURADYAN, DOB Mar 24, 1935, MRN [id number]  PCP:  Illene Regulus, MD  Primary Cardiologist:  Dr. Valera Castle   Primary Electrophysiologist:  Dr. Sherryl Manges    History of Present Illness: Alicia Guerrero is a 77 y.o. female who returns for f/u.  She has a hx of CAD, s/p multiple PCIs in the past. Last PCI with DES to RCA in setting of NSTEMI in 4/12. Previous EF had been 40%, but normal by other measurements. Other hx includes AFib, HTN, HL, LBBB.  She was admitted 9/10-9/26 with witnessed cardiac arrest requiring defib multiple times. Echocardiogram:  EF 40-45% and severe inf HK to AK c/w infarction. CT was negative for pulmonary embolism. LHC 10/09/11 that demonstrated patent stents and high grade lesion in a mod to large OM2. Overall, medical management was felt to be the best course of action (appeared OM2 lesion may have been chronic). Her hospitalization was complicated by difficulty in extubation and she underwent tracheostomy. She was d/c but ultimately underwent CRT-D (St Jude) with Dr. Hillis Range.  She is overall doing well.  Denies chest pain, significant dyspnea, orthopnea, PND, edema, syncope.  No ICD Rx.    Labs (9/13):    K 4.3, creatinine 0.64, ALT 36, Hgb 11.5  Labs (10/13):  K 3.9, creatinine 0.6, ALT 17, LDL 57, Hgb 12 Labs (12/13):  K 4, Cr 0.6, TSH 1.78  Wt Readings from Last 3 Encounters:  04/18/12 164 lb 6.4 oz (74.571 kg)  02/15/12 161 lb (73.029 kg)  12/20/11 161 lb (73.029 kg)     Past Medical History  Diagnosis Date  . Left bundle branch block   . Atrial fibrillation   . CAD (coronary artery disease)     a. s/p stent to RCA 1996;  b. s/p cutting balloon PTCA to LAD 2001; c. s/p Cypher DES to LAD 2003;  d. NSTEMI 4/12: DES to dRCA;  e.  LHC 9/13 (after presentation with cardiac arrest and + Nz's for NSTEMI): pLAD 30% ISR, oD1 30%,  dLAD 60-70%, pOM1 (small vessel) 90%, oOM2 95% (moderate to large vessel), mCFX 30%, pOM3 30-40%, dRCA stents and pPDA stents patent with 30% ISR => med Rx rec.   . Ischemic cardiomyopathy     a. EF 40% at cath 4/12 with AL and Inf HK;  b.  Echo after presentation with cardiac arrest 9/13:  EF 40-45% and severe inf HK to AK c/w infarction, PASP 42  . HLD (hyperlipidemia)   . HTN (hypertension)   . Other atopic dermatitis and related conditions   . Allergic rhinitis due to pollen   . Asthma   . Other and unspecified ovarian cyst   . Personal history of malignant neoplasm of breast   . Personal history of hyperthyroidism   . Chronic eczema     hands  . Hemorrhoids   . Cancer   . Bradycardia     a. coreg tapered off 05/2011 => b. Metoprolol restarted after cardiac arrest 09/2011  . Cardiac arrest 09/2011    a. VT/VF in community; resusc unsuccessful;  PEA in ED; multiple defibs => revived;  [employer];  c/b by VDRF, cardiogenic shock;  LHC with stable anatomy => med Rx;  difficult ween from vent => s/p trach;  d/c to SNF  . Chronic systolic heart failure   . Biventricular  ICD (implantable cardiac defibrillator) in place     St Jude 10/2011 (implant MD: Allred)    Current Outpatient Prescriptions  Medication Sig Dispense Refill  . acetaminophen (TYLENOL) 325 MG tablet Take 650 mg by mouth every 6 (six) hours as needed. For pain      . aspirin EC 81 MG tablet Take 81 mg by mouth every evening.       Marland Kitchen atorvastatin (LIPITOR) 40 MG tablet Take 1 tablet (40 mg total) by mouth daily.  90 tablet  1  . CALCIUM PO Take 1 tablet by mouth daily.      . carvedilol (COREG) 12.5 MG tablet Take 1 tablet (12.5 mg total) by mouth 2 (two) times daily with a meal. FOR THE 1ST 3 DAYS TAKE 1/2 TABLET TWICE DAILY THEN INCREASE TO 1 TABLET TWICE DAILY  60 tablet  11  . diphenhydrAMINE (BENADRYL) 25 mg capsule Take 25 mg by mouth at bedtime as needed. For sleep      . furosemide (LASIX) 40 MG tablet Take  1 tablet (40 mg total) by mouth daily.  90 tablet  1  . Multiple Vitamin (MULTIVITAMIN WITH MINERALS) TABS Take 1 tablet by mouth daily.      . nitroGLYCERIN (NITROSTAT) 0.4 MG SL tablet Place 0.4 mg under the tongue every 5 (five) minutes x 3 doses as needed. Chest pain      . potassium chloride SA (K-DUR,KLOR-CON) 20 MEQ tablet Take 1 tablet (20 mEq total) by mouth daily.  90 tablet  1  . trandolapril ([name]) 4 MG tablet Take 1 tablet (4 mg total) by mouth daily.  90 tablet  1  . warfarin (COUMADIN) 5 MG tablet Take 1 tablet (5 mg total) by mouth daily.  30 tablet  6   No current facility-administered medications for this visit.    Allergies:    Allergies  Allergen Reactions  . Meperidine Hcl Swelling    Mixed with phenergan swelling of the mouth   . Penicillins Other (See Comments)    Pt has taken penicillin about 6 years ago with no side effects  . Molds & Smuts Other (See Comments)    Runny nose  . Tape Other (See Comments)    Redness, pulls skin off    Social History:  The patient  reports that she has never smoked. She has never used smokeless tobacco. She reports that she does not drink alcohol or use illicit drugs.   ROS:  Please see the history of present illness.   She has a non-productive cough that she attributes to allergies as well as s/p trach.  No melena, hematochezia.   All other systems reviewed and negative.   PHYSICAL EXAM: VS:  BP 192/90  Pulse 60  Ht 5\' 3"  (1.6 m)  Wt 164 lb 6.4 oz (74.571 kg)  BMI 29.13 kg/m2 Well nourished, well developed, in no acute distress HEENT: normal Neck: no JVD Cardiac:  normal S1, S2; RRR; no murmur Lungs:  clear to auscultation bilaterally, no wheezing, rhonchi or rales Abd: soft, nontender, no hepatomegaly Ext: no edema Skin: warm and dry Neuro:  CNs 2-12 intact, no focal abnormalities noted  EKG:  AV pace, HR 60     ASSESSMENT AND PLAN:  1. Hypertension:  Uncontrolled.  Increase Coreg to 25 mg bid.  Consider  addition of spironolactone. 2. Chronic Systolic CHF:  Volume stable.  Adjust coreg as noted and consider adding spironolactone at f/u. 3. CAD:  No angina.  Continue  ASA, statin. 4. Hyperlipidemia:  LDL optimal at last check in 10/2011.  Continue current Rx. 5. VFib Arrest, s/p CRT-D:  F/u with Dr. Sherryl Manges as planned. 6. Atrial Fibrillation:  Maintaining NSR.  Continue coumadin. 7. Disposition:  F/u with me in 4-6 weeks.  Luna Glasgow, PA-C  12:32 PM 04/18/2012

## 2012-04-19 ENCOUNTER — Other Ambulatory Visit: Payer: Self-pay | Admitting: *Deleted

## 2012-04-19 MED ORDER — WARFARIN SODIUM 5 MG PO TABS: ORAL_TABLET | ORAL | Status: DC

## 2012-04-25 ENCOUNTER — Ambulatory Visit: Payer: Medicare Other | Admitting: Physician Assistant

## 2012-05-02 ENCOUNTER — Ambulatory Visit (INDEPENDENT_AMBULATORY_CARE_PROVIDER_SITE_OTHER): Payer: Medicare Other | Admitting: *Deleted

## 2012-05-02 DIAGNOSIS — Z7901 Long term (current) use of anticoagulants: Secondary | ICD-10-CM

## 2012-05-02 DIAGNOSIS — I2510 Atherosclerotic heart disease of native coronary artery without angina pectoris: Secondary | ICD-10-CM

## 2012-05-02 DIAGNOSIS — I4891 Unspecified atrial fibrillation: Secondary | ICD-10-CM

## 2012-05-02 LAB — POCT INR: INR: 2.3

## 2012-05-07 ENCOUNTER — Ambulatory Visit: Payer: Medicare Other | Admitting: Cardiology

## 2012-05-13 ENCOUNTER — Telehealth: Payer: Self-pay | Admitting: General Practice

## 2012-05-13 ENCOUNTER — Encounter: Payer: Self-pay | Admitting: Internal Medicine

## 2012-05-13 ENCOUNTER — Ambulatory Visit (INDEPENDENT_AMBULATORY_CARE_PROVIDER_SITE_OTHER): Payer: Medicare Other | Admitting: *Deleted

## 2012-05-13 ENCOUNTER — Other Ambulatory Visit: Payer: Self-pay | Admitting: Internal Medicine

## 2012-05-13 DIAGNOSIS — I5022 Chronic systolic (congestive) heart failure: Secondary | ICD-10-CM

## 2012-05-13 DIAGNOSIS — Z95810 Presence of automatic (implantable) cardiac defibrillator: Secondary | ICD-10-CM

## 2012-05-13 DIAGNOSIS — I2589 Other forms of chronic ischemic heart disease: Secondary | ICD-10-CM

## 2012-05-13 DIAGNOSIS — I255 Ischemic cardiomyopathy: Secondary | ICD-10-CM

## 2012-05-13 NOTE — Telephone Encounter (Signed)
Spoke with spouse and gave dosing instructions for coumadin.

## 2012-05-14 LAB — REMOTE ICD DEVICE
AL AMPLITUDE: 3 mv
AL IMPEDENCE ICD: 410 Ohm
AL THRESHOLD: 0.875 V
HV IMPEDENCE: 65 Ohm
LV LEAD THRESHOLD: 2.375 V
RV LEAD IMPEDENCE ICD: 430 Ohm
RV LEAD THRESHOLD: 0.625 V
TZON-0003SLOWVT: 340 ms
TZON-0010SLOWVT: 40 ms

## 2012-05-27 ENCOUNTER — Ambulatory Visit (INDEPENDENT_AMBULATORY_CARE_PROVIDER_SITE_OTHER): Payer: Medicare Other | Admitting: Cardiovascular Disease

## 2012-05-27 ENCOUNTER — Encounter: Payer: Self-pay | Admitting: Cardiovascular Disease

## 2012-05-27 ENCOUNTER — Ambulatory Visit (INDEPENDENT_AMBULATORY_CARE_PROVIDER_SITE_OTHER): Payer: Medicare Other

## 2012-05-27 ENCOUNTER — Ambulatory Visit: Payer: Medicare Other | Admitting: Physician Assistant

## 2012-05-27 VITALS — BP 156/84 | HR 61 | Ht 63.0 in | Wt 166.8 lb

## 2012-05-27 DIAGNOSIS — I2510 Atherosclerotic heart disease of native coronary artery without angina pectoris: Secondary | ICD-10-CM

## 2012-05-27 DIAGNOSIS — I2589 Other forms of chronic ischemic heart disease: Secondary | ICD-10-CM

## 2012-05-27 DIAGNOSIS — I4891 Unspecified atrial fibrillation: Secondary | ICD-10-CM

## 2012-05-27 DIAGNOSIS — I255 Ischemic cardiomyopathy: Secondary | ICD-10-CM

## 2012-05-27 DIAGNOSIS — Z7901 Long term (current) use of anticoagulants: Secondary | ICD-10-CM

## 2012-05-27 MED ORDER — SPIRONOLACTONE 25 MG PO TABS: 25.0000 mg | ORAL_TABLET | Freq: Every day | ORAL | Status: DC

## 2012-05-27 NOTE — Progress Notes (Signed)
History of Present Illness: Alicia Guerrero is a 77 y.o. female who returns for cardiac f/u. Her cardiac issues are followed by Dr. Daleen Squibb and Dr. Graciela Husbands. This appt had been with [name], PA-C but due to a scheduling change she is added onto my schedule today. She has a hx of CAD, s/p multiple PCIs in the past. Last PCI with DES to RCA in setting of NSTEMI in 4/12. Previous EF had been 40%, but normal by other measurements. Other hx includes AFib, HTN, HL, LBBB. She was admitted 9/10-9/26/13 with witnessed cardiac arrest requiring defib multiple times. Echocardiogram: EF 40-45% and severe inf HK to AK c/w infarction. CT was negative for pulmonary embolism. LHC 10/09/11 that demonstrated patent stents and high grade lesion in a mod to large OM2. Overall, medical management was felt to be the best course of action (appeared OM2 lesion may have been chronic). Her hospitalization was complicated by difficulty in extubation and she underwent tracheostomy. She was d/c but ultimately underwent CRT-D (St Jude) with Dr. Hillis Range.   She is overall doing well. Denies chest pain, significant dyspnea, orthopnea, PND, edema, syncope. No ICD Rx.   Primary Care Physician: Illene Regulus  Last Lipid Profile:Lipid Panel     Component Value Date/Time   CHOL 141 11/07/2011 0959   TRIG 175.0* 11/07/2011 0959   HDL 49.30 11/07/2011 0959   CHOLHDL 3 11/07/2011 0959   VLDL 35.0 11/07/2011 0959   LDLCALC 57 11/07/2011 0959     Past Medical History  Diagnosis Date  . Left bundle branch block   . Atrial fibrillation   . CAD (coronary artery disease)     a. s/p stent to RCA 1996;  b. s/p cutting balloon PTCA to LAD 2001; c. s/p Cypher DES to LAD 2003;  d. NSTEMI 4/12: DES to dRCA;  e.  LHC 9/13 (after presentation with cardiac arrest and + Nz's for NSTEMI): pLAD 30% ISR, oD1 30%, dLAD 60-70%, pOM1 (small vessel) 90%, oOM2 95% (moderate to large vessel), mCFX 30%, pOM3 30-40%, dRCA stents and pPDA stents patent with  30% ISR => med Rx rec.   . Ischemic cardiomyopathy     a. EF 40% at cath 4/12 with AL and Inf HK;  b.  Echo after presentation with cardiac arrest 9/13:  EF 40-45% and severe inf HK to AK c/w infarction, PASP 42  . HLD (hyperlipidemia)   . HTN (hypertension)   . Other atopic dermatitis and related conditions   . Allergic rhinitis due to pollen   . Asthma   . Other and unspecified ovarian cyst   . Personal history of malignant neoplasm of breast   . Personal history of hyperthyroidism   . Chronic eczema     hands  . Hemorrhoids   . Cancer   . Bradycardia     a. coreg tapered off 05/2011 => b. Metoprolol restarted after cardiac arrest 09/2011  . Cardiac arrest 09/2011    a. VT/VF in community; resusc unsuccessful;  PEA in ED; multiple defibs => revived;  [employer];  c/b by VDRF, cardiogenic shock;  LHC with stable anatomy => med Rx;  difficult ween from vent => s/p trach;  d/c to SNF  . Chronic systolic heart failure   . Biventricular ICD (implantable cardiac defibrillator) in place     St Jude 10/2011 (implant MD: Allred)    Past Surgical History  Procedure Laterality Date  . Oophorectomy    . Abdominal hysterectomy    .  Ptca    . Cholecystectomy    . Coronary stent placement  may and  july 2012  . Breast lumpectomy  08/24/2003    right lumpectomy+sln,T2N0,ERPR+,Her2-  . Polypectomy    . Colonoscopy    . Video bronchoscopy  10/26/2011    Procedure: VIDEO BRONCHOSCOPY WITHOUT FLUORO;  Surgeon: Nelda Bucks, MD;  Location: [facility];  Service: Endoscopy;  Laterality: Bilateral;    Current Outpatient Prescriptions  Medication Sig Dispense Refill  . acetaminophen (TYLENOL) 325 MG tablet Take 650 mg by mouth every 6 (six) hours as needed. For pain      . aspirin EC 81 MG tablet Take 81 mg by mouth every evening.       Marland Kitchen atorvastatin (LIPITOR) 40 MG tablet Take 1 tablet (40 mg total) by mouth daily.  90 tablet  3  . CALCIUM PO Take 1 tablet by mouth daily.      .  carvedilol (COREG) 25 MG tablet Take 1 tablet (25 mg total) by mouth 2 (two) times daily with a meal.  180 tablet  3  . cetirizine (ZYRTEC) 10 MG tablet Take 10 mg by mouth as needed for allergies.      . diphenhydrAMINE (BENADRYL) 25 mg capsule Take 25 mg by mouth at bedtime as needed. For sleep      . furosemide (LASIX) 40 MG tablet Take 1 tablet (40 mg total) by mouth daily.  90 tablet  3  . Multiple Vitamin (MULTIVITAMIN WITH MINERALS) TABS Take 1 tablet by mouth daily.      . nitroGLYCERIN (NITROSTAT) 0.4 MG SL tablet Place 0.4 mg under the tongue every 5 (five) minutes x 3 doses as needed. Chest pain      . potassium chloride SA (K-DUR,KLOR-CON) 20 MEQ tablet Take 1 tablet (20 mEq total) by mouth daily.  90 tablet  3  . trandolapril ([name]) 4 MG tablet Take 1 tablet (4 mg total) by mouth daily.  90 tablet  3  . warfarin (COUMADIN) 5 MG tablet Take as directed by coumadin clinic  90 tablet  0   No current facility-administered medications for this visit.    Allergies  Allergen Reactions  . Meperidine Hcl Swelling    Mixed with phenergan swelling of the mouth   . Penicillins Other (See Comments)    Pt has taken penicillin about 6 years ago with no side effects  . Molds & Smuts Other (See Comments)    Runny nose  . Tape Other (See Comments)    Redness, pulls skin off    History   Social History  . Marital Status: Married    Spouse Name: N/A    Number of Children: 2  . Years of Education: N/A   Occupational History  . Retired     Social History Main Topics  . Smoking status: Never Smoker   . Smokeless tobacco: Never Used  . Alcohol Use: No  . Drug Use: No  . Sexually Active: Not on file   Other Topics Concern  . Not on file   Social History Narrative   0 caffeine drinks daily     Family History  Problem Relation Age of Onset  . Uterine cancer Mother   . Hypertension Mother   . Coronary artery disease Father   . Heart disease Sister   . Heart disease Brother    . Endometrial cancer Sister   . Heart disease Brother   . Clotting disorder Brother  blood clot  . Colon cancer Neg Hx     Review of Systems:  As stated in the HPI and otherwise negative.   BP 156/84  Pulse 61  Ht 5\' 3"  (1.6 m)  Wt 166 lb 12.8 oz (75.66 kg)  BMI 29.55 kg/m2  SpO2 94%  Physical Examination: General: Well developed, well nourished, NAD HEENT: OP clear, mucus membranes moist SKIN: warm, dry. No rashes. Neuro: No focal deficits Musculoskeletal: Muscle strength 5/5 all ext Psychiatric: Mood and affect normal Neck: No JVD, no carotid bruits, no thyromegaly, no lymphadenopathy. Lungs:Clear bilaterally, no wheezes, rhonci, crackles Cardiovascular: Regular rate and rhythm. No murmurs, gallops or rubs. Abdomen:Soft. Bowel sounds present. Non-tender.  Extremities: No lower extremity edema. Pulses are 2 + in the bilateral DP/PT.  EKG:  Assessment and Plan:   1. CAD:  Stable. Continue ASA, statin, beta blocker.   2. Ichemic CM: Continue medical therapy. Will add aldactone 25 mg po Qdaily. ICD in place. F/U with Dr. Graciela Husbands as planned.   3. HTN: Reasonable control.   4. Chronic systolic CHF: Volume status is ok.   5. Atrial fibrillation: Maintaining NSR. Continue coumadin   Follow up with Dr Graciela Husbands in 6 months.

## 2012-05-27 NOTE — Patient Instructions (Addendum)
Your physician has recommended you make the following change in your medication: START Aldactone ( 25 mg ) daily. I sent your script in to [facility]  Your physician recommends that you return for lab work in: 06/13/12   Your physician wants you to follow-up in: 6 months with Dr. Graciela Husbands. You will receive a reminder letter in the mail two months in advance. If you don't receive a letter, please call our office to schedule the follow-up appointment.

## 2012-06-05 ENCOUNTER — Other Ambulatory Visit: Payer: Self-pay | Admitting: *Deleted

## 2012-06-05 DIAGNOSIS — I2510 Atherosclerotic heart disease of native coronary artery without angina pectoris: Secondary | ICD-10-CM

## 2012-06-05 DIAGNOSIS — I255 Ischemic cardiomyopathy: Secondary | ICD-10-CM

## 2012-06-05 DIAGNOSIS — I4891 Unspecified atrial fibrillation: Secondary | ICD-10-CM

## 2012-06-05 MED ORDER — SPIRONOLACTONE 25 MG PO TABS: 25.0000 mg | ORAL_TABLET | Freq: Every day | ORAL | Status: DC

## 2012-06-13 ENCOUNTER — Other Ambulatory Visit: Payer: Medicare Other

## 2012-06-21 ENCOUNTER — Ambulatory Visit (INDEPENDENT_AMBULATORY_CARE_PROVIDER_SITE_OTHER): Payer: Medicare Other | Admitting: *Deleted

## 2012-06-21 DIAGNOSIS — Z7901 Long term (current) use of anticoagulants: Secondary | ICD-10-CM

## 2012-06-21 DIAGNOSIS — I2510 Atherosclerotic heart disease of native coronary artery without angina pectoris: Secondary | ICD-10-CM

## 2012-06-21 DIAGNOSIS — I4891 Unspecified atrial fibrillation: Secondary | ICD-10-CM

## 2012-06-21 LAB — POCT INR: INR: 2.1

## 2012-07-10 ENCOUNTER — Encounter (INDEPENDENT_AMBULATORY_CARE_PROVIDER_SITE_OTHER): Payer: Self-pay | Admitting: General Surgery

## 2012-07-22 ENCOUNTER — Ambulatory Visit (INDEPENDENT_AMBULATORY_CARE_PROVIDER_SITE_OTHER): Payer: Medicare Other | Admitting: Pharmacist

## 2012-07-22 DIAGNOSIS — Z7901 Long term (current) use of anticoagulants: Secondary | ICD-10-CM

## 2012-07-22 DIAGNOSIS — I4891 Unspecified atrial fibrillation: Secondary | ICD-10-CM

## 2012-07-22 DIAGNOSIS — I2510 Atherosclerotic heart disease of native coronary artery without angina pectoris: Secondary | ICD-10-CM

## 2012-07-22 LAB — POCT INR: INR: 2.2

## 2012-07-25 ENCOUNTER — Telehealth: Payer: Self-pay | Admitting: Cardiovascular Disease

## 2012-07-25 NOTE — Telephone Encounter (Signed)
REFILL REQUEST   COUMADIN

## 2012-07-26 ENCOUNTER — Telehealth: Payer: Self-pay | Admitting: *Deleted

## 2012-07-26 MED ORDER — WARFARIN SODIUM 5 MG PO TABS: ORAL_TABLET | ORAL | Status: DC

## 2012-07-26 NOTE — Telephone Encounter (Signed)
Pt called to cancel her appts for 08/08/12 stated that she has enough medical bills from being in the hospital, and that she will see Dr. Carolynne Edouard.....td

## 2012-08-01 ENCOUNTER — Other Ambulatory Visit: Payer: Self-pay | Admitting: *Deleted

## 2012-08-01 ENCOUNTER — Other Ambulatory Visit (HOSPITAL_BASED_OUTPATIENT_CLINIC_OR_DEPARTMENT_OTHER): Payer: Medicare Other | Admitting: Lab

## 2012-08-01 DIAGNOSIS — C50919 Malignant neoplasm of unspecified site of unspecified female breast: Secondary | ICD-10-CM | POA: Insufficient documentation

## 2012-08-01 LAB — CBC WITH DIFFERENTIAL/PLATELET
BASO%: 2 % (ref 0.0–2.0)
EOS%: 6.8 % (ref 0.0–7.0)
HCT: 40.6 % (ref 34.8–46.6)
MCH: 27.7 pg (ref 25.1–34.0)
MCHC: 33.2 g/dL (ref 31.5–36.0)
MONO#: 0.6 10*3/uL (ref 0.1–0.9)
RBC: 4.86 10*6/uL (ref 3.70–5.45)
RDW: 13.9 % (ref 11.2–14.5)
WBC: 6.4 10*3/uL (ref 3.9–10.3)
lymph#: 2 10*3/uL (ref 0.9–3.3)

## 2012-08-01 LAB — COMPREHENSIVE METABOLIC PANEL (CC13)
ALT: 17 U/L (ref 0–55)
AST: 20 U/L (ref 5–34)
Albumin: 3.9 g/dL (ref 3.5–5.0)
CO2: 29 mEq/L (ref 22–29)
Calcium: 10.1 mg/dL (ref 8.4–10.4)
Chloride: 103 mEq/L (ref 98–109)
Creatinine: 0.8 mg/dL (ref 0.6–1.1)
Potassium: 4.1 mEq/L (ref 3.5–5.1)
Sodium: 140 mEq/L (ref 136–145)
Total Protein: 7 g/dL (ref 6.4–8.3)

## 2012-08-08 ENCOUNTER — Ambulatory Visit: Payer: Medicare Other | Admitting: Oncology

## 2012-08-08 ENCOUNTER — Ambulatory Visit: Payer: Medicare Other | Admitting: Family

## 2012-08-19 ENCOUNTER — Ambulatory Visit (INDEPENDENT_AMBULATORY_CARE_PROVIDER_SITE_OTHER): Payer: Medicare Other | Admitting: *Deleted

## 2012-08-19 ENCOUNTER — Encounter: Payer: Self-pay | Admitting: Internal Medicine

## 2012-08-19 DIAGNOSIS — Z95810 Presence of automatic (implantable) cardiac defibrillator: Secondary | ICD-10-CM

## 2012-08-19 DIAGNOSIS — I255 Ischemic cardiomyopathy: Secondary | ICD-10-CM

## 2012-08-19 DIAGNOSIS — I2589 Other forms of chronic ischemic heart disease: Secondary | ICD-10-CM

## 2012-08-20 LAB — REMOTE ICD DEVICE
AL AMPLITUDE: 0.6 mv
AL IMPEDENCE ICD: 380 Ohm
AL THRESHOLD: 0.75 V
ATRIAL PACING ICD: 90 pct
BAMS-0003: 70 {beats}/min
DEV-0020ICD: NEGATIVE
LV LEAD THRESHOLD: 2.75 V
RV LEAD IMPEDENCE ICD: 440 Ohm
RV LEAD THRESHOLD: 0.75 V
TZON-0010SLOWVT: 40 ms

## 2012-08-27 ENCOUNTER — Ambulatory Visit (INDEPENDENT_AMBULATORY_CARE_PROVIDER_SITE_OTHER): Payer: Medicare Other | Admitting: General Surgery

## 2012-08-27 ENCOUNTER — Encounter (INDEPENDENT_AMBULATORY_CARE_PROVIDER_SITE_OTHER): Payer: Self-pay | Admitting: General Surgery

## 2012-08-27 VITALS — BP 140/80 | HR 88 | Resp 16 | Ht 63.0 in | Wt 171.4 lb

## 2012-08-27 DIAGNOSIS — C50911 Malignant neoplasm of unspecified site of right female breast: Secondary | ICD-10-CM

## 2012-08-27 DIAGNOSIS — C50919 Malignant neoplasm of unspecified site of unspecified female breast: Secondary | ICD-10-CM

## 2012-08-27 NOTE — Patient Instructions (Signed)
Continue regular self exams  

## 2012-09-02 ENCOUNTER — Ambulatory Visit (INDEPENDENT_AMBULATORY_CARE_PROVIDER_SITE_OTHER): Payer: Medicare Other | Admitting: Pharmacist

## 2012-09-02 DIAGNOSIS — I4891 Unspecified atrial fibrillation: Secondary | ICD-10-CM

## 2012-09-02 DIAGNOSIS — I2510 Atherosclerotic heart disease of native coronary artery without angina pectoris: Secondary | ICD-10-CM

## 2012-09-02 DIAGNOSIS — Z7901 Long term (current) use of anticoagulants: Secondary | ICD-10-CM

## 2012-09-11 ENCOUNTER — Encounter: Payer: Self-pay | Admitting: *Deleted

## 2012-09-23 NOTE — Progress Notes (Signed)
Subjective:     Patient ID: Alicia Guerrero, female   DOB: 1935-07-02, 77 y.o.   MRN: [id number]  HPI The patient is a 77 year old female who is 9 years status post right lumpectomy and negative sentinel node biopsy for a T2 N0 right breast cancer. Her last mammogram was in December that showed no evidence of malignancy. Since her last visit she has had a myocardial infarction and had to have a defibrillator placed. She denies any breast pain.  Review of Systems  Constitutional: Negative.   HENT: Negative.   Eyes: Negative.   Respiratory: Negative.   Cardiovascular: Positive for chest pain.  Gastrointestinal: Negative.   Endocrine: Negative.   Genitourinary: Negative.   Musculoskeletal: Negative.   Skin: Negative.   Allergic/Immunologic: Negative.   Neurological: Negative.   Hematological: Negative.   Psychiatric/Behavioral: Negative.        Objective:   Physical Exam  Constitutional: She is oriented to person, place, and time. She appears well-developed and well-nourished.  HENT:  Head: Normocephalic and atraumatic.  Eyes: Conjunctivae and EOM are normal. Pupils are equal, round, and reactive to light.  Neck: Normal range of motion. Neck supple.  Cardiovascular: Normal rate, regular rhythm and normal heart sounds.   Pulmonary/Chest: Effort normal and breath sounds normal.  There is some palpable scar in the upper outer right breast with some telangiectasia. This is stable. There is no palpable axillary, cervical, or supraclavicular lymphadenopathy  Abdominal: Soft. Bowel sounds are normal.  Musculoskeletal: Normal range of motion.  Neurological: She is alert and oriented to person, place, and time.  Skin: Skin is warm and dry.  Psychiatric: She has a normal mood and affect. Her behavior is normal.       Assessment:     The patient is 9 years status post right lumpectomy for breast cancer     Plan:     At this point she will continue to do regular self exams. We will plan  to see her back in one year.

## 2012-09-30 ENCOUNTER — Ambulatory Visit (INDEPENDENT_AMBULATORY_CARE_PROVIDER_SITE_OTHER): Payer: Medicare Other | Admitting: Pharmacist

## 2012-09-30 DIAGNOSIS — Z7901 Long term (current) use of anticoagulants: Secondary | ICD-10-CM

## 2012-09-30 DIAGNOSIS — I2510 Atherosclerotic heart disease of native coronary artery without angina pectoris: Secondary | ICD-10-CM

## 2012-09-30 DIAGNOSIS — I4891 Unspecified atrial fibrillation: Secondary | ICD-10-CM

## 2012-09-30 LAB — POCT INR: INR: 1.9

## 2012-10-10 ENCOUNTER — Telehealth: Payer: Self-pay | Admitting: *Deleted

## 2012-10-10 MED ORDER — WARFARIN SODIUM 5 MG PO TABS: ORAL_TABLET | ORAL | Status: DC

## 2012-10-10 NOTE — Telephone Encounter (Signed)
Patient is requesting Coumadin refill to be sent to [facility].

## 2012-10-10 NOTE — Addendum Note (Signed)
Addended by: Migdalia Dk on: 10/10/2012 11:48 AM   Modules accepted: Orders

## 2012-10-17 ENCOUNTER — Ambulatory Visit (INDEPENDENT_AMBULATORY_CARE_PROVIDER_SITE_OTHER): Payer: Medicare Other | Admitting: *Deleted

## 2012-10-17 ENCOUNTER — Ambulatory Visit (INDEPENDENT_AMBULATORY_CARE_PROVIDER_SITE_OTHER): Payer: Medicare Other | Admitting: General Practice

## 2012-10-17 ENCOUNTER — Telehealth: Payer: Self-pay | Admitting: Internal Medicine

## 2012-10-17 DIAGNOSIS — Z7901 Long term (current) use of anticoagulants: Secondary | ICD-10-CM

## 2012-10-17 DIAGNOSIS — Z95810 Presence of automatic (implantable) cardiac defibrillator: Secondary | ICD-10-CM

## 2012-10-17 DIAGNOSIS — I2510 Atherosclerotic heart disease of native coronary artery without angina pectoris: Secondary | ICD-10-CM

## 2012-10-17 DIAGNOSIS — I4891 Unspecified atrial fibrillation: Secondary | ICD-10-CM

## 2012-10-17 NOTE — Telephone Encounter (Signed)
New Problem  Pt states that she has a bruise above the defib that she has never had before. Request a call back to discuss.

## 2012-10-17 NOTE — Progress Notes (Signed)
Site check per pt concern. Pt has bruise which has dissipated some since yesterday. Pt nor spouse recall her having an event to cause the bruise. Site okay per Dr. Ladona Ridgel. Pt instructed to contact us if redness w/ heat occurs or incision drainage of any sort.   Pt on coumadin---being checked today.

## 2012-10-17 NOTE — Telephone Encounter (Signed)
Pt scheduled for wound check at 1200 today.

## 2012-10-29 ENCOUNTER — Ambulatory Visit: Payer: Commercial Managed Care - HMO

## 2012-10-29 ENCOUNTER — Ambulatory Visit (INDEPENDENT_AMBULATORY_CARE_PROVIDER_SITE_OTHER): Payer: Medicare Other | Admitting: General Practice

## 2012-10-29 DIAGNOSIS — Z7901 Long term (current) use of anticoagulants: Secondary | ICD-10-CM

## 2012-10-29 DIAGNOSIS — Z23 Encounter for immunization: Secondary | ICD-10-CM

## 2012-10-29 DIAGNOSIS — I4891 Unspecified atrial fibrillation: Secondary | ICD-10-CM

## 2012-10-29 DIAGNOSIS — I2510 Atherosclerotic heart disease of native coronary artery without angina pectoris: Secondary | ICD-10-CM

## 2012-11-12 ENCOUNTER — Other Ambulatory Visit: Payer: Self-pay | Admitting: *Deleted

## 2012-11-12 DIAGNOSIS — I5022 Chronic systolic (congestive) heart failure: Secondary | ICD-10-CM

## 2012-11-12 MED ORDER — POTASSIUM CHLORIDE CRYS ER 20 MEQ PO TBCR: 20.0000 meq | EXTENDED_RELEASE_TABLET | Freq: Every day | ORAL | Status: DC

## 2012-11-13 ENCOUNTER — Encounter: Payer: Medicare Other | Admitting: Internal Medicine

## 2012-11-26 ENCOUNTER — Ambulatory Visit (INDEPENDENT_AMBULATORY_CARE_PROVIDER_SITE_OTHER): Payer: Medicare Other | Admitting: General Practice

## 2012-11-26 DIAGNOSIS — I4891 Unspecified atrial fibrillation: Secondary | ICD-10-CM

## 2012-11-26 DIAGNOSIS — I2510 Atherosclerotic heart disease of native coronary artery without angina pectoris: Secondary | ICD-10-CM

## 2012-11-26 DIAGNOSIS — Z7901 Long term (current) use of anticoagulants: Secondary | ICD-10-CM

## 2012-11-26 LAB — POCT INR: INR: 4.5

## 2012-11-26 NOTE — Progress Notes (Signed)
Pre-visit discussion using our clinic review tool. No additional management support is needed unless otherwise documented below in the visit note.  

## 2012-12-05 ENCOUNTER — Observation Stay (HOSPITAL_COMMUNITY): Payer: Medicare Other

## 2012-12-05 ENCOUNTER — Telehealth: Payer: Self-pay | Admitting: Internal Medicine

## 2012-12-05 ENCOUNTER — Encounter (HOSPITAL_COMMUNITY): Payer: Self-pay | Admitting: Emergency Medicine

## 2012-12-05 ENCOUNTER — Emergency Department (HOSPITAL_COMMUNITY): Payer: Medicare Other

## 2012-12-05 ENCOUNTER — Inpatient Hospital Stay (HOSPITAL_COMMUNITY)
Admission: EM | Admit: 2012-12-05 | Discharge: 2012-12-07 | DRG: 065 | Disposition: A | Discharge status: Home / Self Care | Payer: Medicare Other | Attending: Internal Medicine | Admitting: Internal Medicine

## 2012-12-05 DIAGNOSIS — R29898 Other symptoms and signs involving the musculoskeletal system: Secondary | ICD-10-CM | POA: Diagnosis present

## 2012-12-05 DIAGNOSIS — I639 Cerebral infarction, unspecified: Secondary | ICD-10-CM

## 2012-12-05 DIAGNOSIS — Z8674 Personal history of sudden cardiac arrest: Secondary | ICD-10-CM

## 2012-12-05 DIAGNOSIS — Z9861 Coronary angioplasty status: Secondary | ICD-10-CM

## 2012-12-05 DIAGNOSIS — R791 Abnormal coagulation profile: Secondary | ICD-10-CM

## 2012-12-05 DIAGNOSIS — I5022 Chronic systolic (congestive) heart failure: Secondary | ICD-10-CM

## 2012-12-05 DIAGNOSIS — Z9181 History of falling: Secondary | ICD-10-CM

## 2012-12-05 DIAGNOSIS — E782 Mixed hyperlipidemia: Secondary | ICD-10-CM

## 2012-12-05 DIAGNOSIS — Z8249 Family history of ischemic heart disease and other diseases of the circulatory system: Secondary | ICD-10-CM

## 2012-12-05 DIAGNOSIS — Z95810 Presence of automatic (implantable) cardiac defibrillator: Secondary | ICD-10-CM

## 2012-12-05 DIAGNOSIS — J45909 Unspecified asthma, uncomplicated: Secondary | ICD-10-CM | POA: Diagnosis present

## 2012-12-05 DIAGNOSIS — Z7901 Long term (current) use of anticoagulants: Secondary | ICD-10-CM

## 2012-12-05 DIAGNOSIS — I255 Ischemic cardiomyopathy: Secondary | ICD-10-CM

## 2012-12-05 DIAGNOSIS — I252 Old myocardial infarction: Secondary | ICD-10-CM

## 2012-12-05 DIAGNOSIS — I447 Left bundle-branch block, unspecified: Secondary | ICD-10-CM | POA: Diagnosis present

## 2012-12-05 DIAGNOSIS — R4182 Altered mental status, unspecified: Secondary | ICD-10-CM

## 2012-12-05 DIAGNOSIS — E0590 Thyrotoxicosis, unspecified without thyrotoxic crisis or storm: Secondary | ICD-10-CM | POA: Diagnosis present

## 2012-12-05 DIAGNOSIS — I779 Disorder of arteries and arterioles, unspecified: Secondary | ICD-10-CM

## 2012-12-05 DIAGNOSIS — I10 Essential (primary) hypertension: Secondary | ICD-10-CM

## 2012-12-05 DIAGNOSIS — I2510 Atherosclerotic heart disease of native coronary artery without angina pectoris: Secondary | ICD-10-CM | POA: Diagnosis present

## 2012-12-05 DIAGNOSIS — Z853 Personal history of malignant neoplasm of breast: Secondary | ICD-10-CM

## 2012-12-05 DIAGNOSIS — Z7982 Long term (current) use of aspirin: Secondary | ICD-10-CM

## 2012-12-05 DIAGNOSIS — I6350 Cerebral infarction due to unspecified occlusion or stenosis of unspecified cerebral artery: Principal | ICD-10-CM

## 2012-12-05 DIAGNOSIS — I379 Nonrheumatic pulmonary valve disorder, unspecified: Secondary | ICD-10-CM

## 2012-12-05 DIAGNOSIS — I2589 Other forms of chronic ischemic heart disease: Secondary | ICD-10-CM | POA: Diagnosis present

## 2012-12-05 DIAGNOSIS — I4891 Unspecified atrial fibrillation: Secondary | ICD-10-CM

## 2012-12-05 LAB — URINE MICROSCOPIC-ADD ON

## 2012-12-05 LAB — COMPREHENSIVE METABOLIC PANEL
ALT: 17 U/L (ref 0–35)
AST: 22 U/L (ref 0–37)
Albumin: 4.4 g/dL (ref 3.5–5.2)
Alkaline Phosphatase: 86 U/L (ref 39–117)
BUN: 14 mg/dL (ref 6–23)
CO2: 29 mEq/L (ref 19–32)
GFR calc non Af Amer: 84 mL/min — ABNORMAL LOW (ref >90)
Potassium: 4.1 mEq/L (ref 3.5–5.1)
Sodium: 138 mEq/L (ref 135–145)
Total Bilirubin: 0.7 mg/dL (ref 0.3–1.2)
Total Protein: 7.7 g/dL (ref 6.0–8.3)

## 2012-12-05 LAB — APTT: aPTT: 68 seconds — ABNORMAL HIGH (ref 24–37)

## 2012-12-05 LAB — DIFFERENTIAL
Basophils Absolute: 0.1 10*3/uL (ref 0.0–0.1)
Basophils Relative: 1 % (ref 0–1)
Eosinophils Absolute: 0.3 10*3/uL (ref 0.0–0.7)
Monocytes Absolute: 0.8 10*3/uL (ref 0.1–1.0)
Neutrophils Relative %: 61 % (ref 43–77)

## 2012-12-05 LAB — URINALYSIS, ROUTINE W REFLEX MICROSCOPIC
Glucose, UA: NEGATIVE mg/dL
Hgb urine dipstick: NEGATIVE
Ketones, ur: NEGATIVE mg/dL
Protein, ur: NEGATIVE mg/dL

## 2012-12-05 LAB — CBC
HCT: 44.6 % (ref 36.0–46.0)
MCH: 28.7 pg (ref 26.0–34.0)
MCHC: 34.5 g/dL (ref 30.0–36.0)
Platelets: 296 10*3/uL (ref 150–400)
RDW: 13.4 % (ref 11.5–15.5)

## 2012-12-05 LAB — HEMOGLOBIN A1C: Mean Plasma Glucose: 146 mg/dL — ABNORMAL HIGH (ref <117)

## 2012-12-05 LAB — POCT I-STAT, CHEM 8
BUN: 14 mg/dL (ref 6–23)
Chloride: 99 mEq/L (ref 96–112)
Creatinine, Ser: 1 mg/dL (ref 0.50–1.10)
Glucose, Bld: 117 mg/dL — ABNORMAL HIGH (ref 70–99)
Potassium: 4.1 mEq/L (ref 3.5–5.1)

## 2012-12-05 LAB — GLUCOSE, CAPILLARY: Glucose-Capillary: 95 mg/dL (ref 70–99)

## 2012-12-05 LAB — TROPONIN I: Troponin I: <0.3 ng/mL (ref <0.30)

## 2012-12-05 LAB — PROTIME-INR
INR: 4.59 — ABNORMAL HIGH (ref 0.00–1.49)
Prothrombin Time: 41.6 seconds — ABNORMAL HIGH (ref 11.6–15.2)

## 2012-12-05 LAB — RAPID URINE DRUG SCREEN, HOSP PERFORMED
Amphetamines: NOT DETECTED
Benzodiazepines: NOT DETECTED

## 2012-12-05 LAB — POCT I-STAT TROPONIN I

## 2012-12-05 MED ORDER — FUROSEMIDE 40 MG PO TABS
40.0000 mg | ORAL_TABLET | Freq: Every day | ORAL | Status: DC
  Administered 2012-12-05 – 2012-12-07 (×3): 40 mg via ORAL
  Filled 2012-12-05 (×3): qty 1

## 2012-12-05 MED ORDER — NITROGLYCERIN 0.4 MG SL SUBL: 0.4000 mg | SUBLINGUAL_TABLET | SUBLINGUAL | Status: DC | PRN

## 2012-12-05 MED ORDER — ACETAMINOPHEN 325 MG PO TABS: 650.0000 mg | ORAL_TABLET | Freq: Four times a day (QID) | ORAL | Status: DC | PRN

## 2012-12-05 MED ORDER — CARVEDILOL 25 MG PO TABS
25.0000 mg | ORAL_TABLET | Freq: Once | ORAL | Status: AC
  Administered 2012-12-05: 25 mg via ORAL
  Filled 2012-12-05: qty 1

## 2012-12-05 MED ORDER — TRANDOLAPRIL 4 MG PO TABS
4.0000 mg | ORAL_TABLET | Freq: Every day | ORAL | Status: DC
  Administered 2012-12-06 – 2012-12-07 (×2): 4 mg via ORAL
  Filled 2012-12-05 (×2): qty 1

## 2012-12-05 MED ORDER — SENNOSIDES-DOCUSATE SODIUM 8.6-50 MG PO TABS
1.0000 | ORAL_TABLET | Freq: Every evening | ORAL | Status: DC | PRN
  Filled 2012-12-05: qty 1

## 2012-12-05 MED ORDER — CARVEDILOL 25 MG PO TABS
25.0000 mg | ORAL_TABLET | Freq: Two times a day (BID) | ORAL | Status: DC
  Administered 2012-12-06 – 2012-12-07 (×3): 25 mg via ORAL
  Filled 2012-12-05 (×6): qty 1

## 2012-12-05 MED ORDER — ATORVASTATIN CALCIUM 40 MG PO TABS
40.0000 mg | ORAL_TABLET | Freq: Every day | ORAL | Status: DC
  Administered 2012-12-05 – 2012-12-07 (×3): 40 mg via ORAL
  Filled 2012-12-05 (×3): qty 1

## 2012-12-05 MED ORDER — DIPHENHYDRAMINE HCL 25 MG PO CAPS: 25.0000 mg | ORAL_CAPSULE | Freq: Every evening | ORAL | Status: DC | PRN

## 2012-12-05 MED ORDER — ADULT MULTIVITAMIN W/MINERALS CH
1.0000 | ORAL_TABLET | Freq: Every day | ORAL | Status: DC
  Administered 2012-12-05 – 2012-12-07 (×3): 1 via ORAL
  Filled 2012-12-05 (×3): qty 1

## 2012-12-05 MED ORDER — POTASSIUM CHLORIDE CRYS ER 20 MEQ PO TBCR
20.0000 meq | EXTENDED_RELEASE_TABLET | Freq: Every day | ORAL | Status: DC
Start: 2012-12-06 — End: 2012-12-07
  Administered 2012-12-06 – 2012-12-07 (×2): 20 meq via ORAL
  Filled 2012-12-05 (×2): qty 1

## 2012-12-05 MED ORDER — STROKE: EARLY STAGES OF RECOVERY BOOK
Freq: Once | Status: AC
  Administered 2012-12-06: 08:00:00
  Filled 2012-12-05: qty 1

## 2012-12-05 MED ORDER — SPIRONOLACTONE 25 MG PO TABS
25.0000 mg | ORAL_TABLET | Freq: Every day | ORAL | Status: DC
  Administered 2012-12-06 – 2012-12-07 (×2): 25 mg via ORAL
  Filled 2012-12-05 (×2): qty 1

## 2012-12-05 MED ORDER — ZOLPIDEM TARTRATE 5 MG PO TABS
5.0000 mg | ORAL_TABLET | Freq: Every evening | ORAL | Status: DC | PRN
  Administered 2012-12-06: 5 mg via ORAL
  Filled 2012-12-05: qty 1

## 2012-12-05 NOTE — H&P (Signed)
[facility]  History and Physical  Alicia Guerrero AVW:[id number] DOB: Nov 24, 1935 DOA: 12/05/2012  Referring physician: EDP PCP: Illene Regulus, MD  Outpatient Specialists:  1. EPS/Cardiology: Dr. Sherryl Manges.  Chief Complaint: Right sided weakness, slurred speech  HPI: Alicia Guerrero is a 77 y.o. female , left-handed, extensive cardiac history-LBBB, A. fib on Coumadin, CAD status post stenting, ischemic cardiomyopathy with EF 40%-45% 9/13, hyperlipidemia, hypertension, cardiac arrest, chronic systolic CHF, [employer] AICD, presented to the ED secondary to right-sided weakness and slurred speech. She was driving her son back from a doctor's appointment on 11/19 and at approximately 3 PM, she and her son noted that she was not driving steadily and the car was swerving, almost waiting mailboxes on the right side of the street. 11/19 at approximately 8:45 PM, her right lower extremity suddenly gave way like it almost did not exist, she still forwarded in the bathroom with her for admitting the wall and she landed on the floor. She denied preceding headache, dizziness, lightheadedness, chest pain or palpitations. There was no loss of consciousness. She got up by herself walked back to her bed and slept through the night. 11/20 a.m., she continued to notice right lower extremity weakness and subsequently while making tea, she noticed that her hand movements were not coordinated and she was spilling things. Family also noticed slurred speech and some facial asymmetry. Family alerted her primary cardiologist and at the same time called EMS. In the ED, CT head showed prior infarct left frontal lobe. Infarcts in the anterior left parietal lobe and anterior left thalamus which were not present previously and may be fairly recent. No hemorrhage or mass effect. Neurology was consulted and hospitalist admission requested.   Review of Systems: All systems reviewed and apart from history of presenting illness, are  negative.  Past Medical History  Diagnosis Date  . Left bundle branch block   . Atrial fibrillation   . CAD (coronary artery disease)     a. s/p stent to RCA 1996;  b. s/p cutting balloon PTCA to LAD 2001; c. s/p Cypher DES to LAD 2003;  d. NSTEMI 4/12: DES to dRCA;  e.  LHC 9/13 (after presentation with cardiac arrest and + Nz's for NSTEMI): pLAD 30% ISR, oD1 30%, dLAD 60-70%, pOM1 (small vessel) 90%, oOM2 95% (moderate to large vessel), mCFX 30%, pOM3 30-40%, dRCA stents and pPDA stents patent with 30% ISR => med Rx rec.   . Ischemic cardiomyopathy     a. EF 40% at cath 4/12 with AL and Inf HK;  b.  Echo after presentation with cardiac arrest 9/13:  EF 40-45% and severe inf HK to AK c/w infarction, PASP 42  . HLD (hyperlipidemia)   . HTN (hypertension)   . Other atopic dermatitis and related conditions   . Allergic rhinitis due to pollen   . Asthma   . Other and unspecified ovarian cyst   . Personal history of malignant neoplasm of breast   . Personal history of hyperthyroidism   . Chronic eczema     hands  . Hemorrhoids   . Cancer   . Bradycardia     a. coreg tapered off 05/2011 => b. Metoprolol restarted after cardiac arrest 09/2011  . Cardiac arrest 09/2011    a. VT/VF in community; resusc unsuccessful;  PEA in ED; multiple defibs => revived;  [employer];  c/b by VDRF, cardiogenic shock;  LHC with stable anatomy => med Rx;  difficult ween from vent =>  s/p trach;  d/c to SNF  . Chronic systolic heart failure   . Biventricular ICD (implantable cardiac defibrillator) in place     St Jude 10/2011 (implant MD: Allred)   Past Surgical History  Procedure Laterality Date  . Oophorectomy    . Abdominal hysterectomy    . Ptca    . Cholecystectomy    . Coronary stent placement  may and  july 2012  . Breast lumpectomy  08/24/2003    right lumpectomy+sln,T2N0,ERPR+,Her2-  . Polypectomy    . Colonoscopy    . Video bronchoscopy  10/26/2011    Procedure: VIDEO BRONCHOSCOPY WITHOUT  FLUORO;  Surgeon: Nelda Bucks, MD;  Location: [facility];  Service: Endoscopy;  Laterality: Bilateral;   Social History:  reports that she has never smoked. She has never used smokeless tobacco. She reports that she does not drink alcohol or use illicit drugs. Married. Independent of activities of daily living.  Allergies  Allergen Reactions  . Meperidine Hcl Swelling    Mixed with phenergan swelling of the mouth   . Penicillins Other (See Comments)    Pt has taken penicillin about 6 years ago with no side effects  . Molds & Smuts Other (See Comments)    Runny nose  . Tape Other (See Comments)    Redness, pulls skin off    Family History  Problem Relation Age of Onset  . Uterine cancer Mother   . Hypertension Mother   . Coronary artery disease Father   . Heart disease Sister   . Heart disease Brother   . Endometrial cancer Sister   . Heart disease Brother   . Clotting disorder Brother     blood clot  . Colon cancer Neg Hx     Prior to Admission medications   Medication Sig Start Date End Date Taking? Authorizing Provider  acetaminophen (TYLENOL) 325 MG tablet Take 650 mg by mouth every 6 (six) hours as needed. For pain   Yes Historical Provider, MD  aspirin EC 81 MG tablet Take 81 mg by mouth every evening.    Yes Historical Provider, MD  atorvastatin (LIPITOR) 40 MG tablet Take 1 tablet (40 mg total) by mouth daily. 04/18/12  Yes Scott Moishe Spice, PA-C  CALCIUM PO Take 1 tablet by mouth daily.   Yes Historical Provider, MD  carvedilol (COREG) 25 MG tablet Take 1 tablet (25 mg total) by mouth 2 (two) times daily with a meal. 04/18/12  Yes Scott T Alben Spittle, PA-C  cetirizine (ZYRTEC) 10 MG tablet Take 10 mg by mouth as needed for allergies.   Yes Historical Provider, MD  diphenhydrAMINE (BENADRYL) 25 mg capsule Take 25 mg by mouth at bedtime as needed. For sleep   Yes Historical Provider, MD  furosemide (LASIX) 40 MG tablet Take 1 tablet (40 mg total) by mouth daily. 04/18/12   Yes Beatrice Lecher, PA-C  Multiple Vitamin (MULTIVITAMIN WITH MINERALS) TABS Take 1 tablet by mouth daily.   Yes Historical Provider, MD  nitroGLYCERIN (NITROSTAT) 0.4 MG SL tablet Place 0.4 mg under the tongue every 5 (five) minutes x 3 doses as needed for chest pain. Chest pain   Yes Historical Provider, MD  potassium chloride SA (K-DUR,KLOR-CON) 20 MEQ tablet Take 1 tablet (20 mEq total) by mouth daily. 11/12/12  Yes Kathleene [name], MD  spironolactone (ALDACTONE) 25 MG tablet Take 1 tablet (25 mg total) by mouth daily. 06/05/12  Yes Kathleene [name], MD  trandolapril ([name]) 4 MG tablet Take  1 tablet (4 mg total) by mouth daily. 04/18/12  Yes Beatrice Lecher, PA-C  warfarin (COUMADIN) 5 MG tablet Take 2.5-5 mg by mouth daily. On Wednesday take a half a tablet 2.5mg .  All other days take a whole tablet 5mg .   Yes Historical Provider, MD   Physical Exam: Filed Vitals:   12/05/12 1445 12/05/12 1452 12/05/12 1500 12/05/12 1515  BP:  130/97    Pulse: 66 65 66 58  Temp:      TempSrc:      Resp: 20 16 16 17   Height:      Weight:      SpO2: 95% 95% 96% 95%     General exam: Moderately built and nourished female patient, lying comfortably supine on the gurney in no obvious distress.  Head, eyes and ENT: Nontraumatic and normocephalic. Pupils equally reacting to light and accommodation. Oral mucosa moist.  Neck: Supple. No JVD, carotid bruit or thyromegaly.  Lymphatics: No lymphadenopathy.  Respiratory system: Clear to auscultation. No increased work of breathing. AICD box left upper anterior chest.  Cardiovascular system: S1 and S2 heard, RRR. No JVD, murmurs, gallops, clicks or pedal edema.  Gastrointestinal system: Abdomen is nondistended, soft and nontender. Normal bowel sounds heard. No organomegaly or masses appreciated.  Central nervous system: Alert and oriented. Mild right lower facial weakness. No dysarthria or aphasia.  Extremities: Left extremities 5 x 5 power.  Right extremities 4+/5 power. Peripheral pulses symmetrically felt.   Skin: No rashes or acute findings.  Musculoskeletal system: Negative exam.  Psychiatry: Pleasant and cooperative.   Labs on Admission:  Basic Metabolic Panel:  Recent Labs Lab 12/05/12 1232 12/05/12 1254  NA 138 139  K 4.1 4.1  CL 99 99  CO2 29  --   GLUCOSE 111* 117*  BUN 14 14  CREATININE 0.67 1.00  CALCIUM 10.0  --    Liver Function Tests:  Recent Labs Lab 12/05/12 1232  AST 22  ALT 17  ALKPHOS 86  BILITOT 0.7  PROT 7.7  ALBUMIN 4.4   No results found for this basename: LIPASE, AMYLASE,  in the last 168 hours No results found for this basename: AMMONIA,  in the last 168 hours CBC:  Recent Labs Lab 12/05/12 1232 12/05/12 1254  WBC 8.8  --   NEUTROABS 5.3  --   HGB 15.4* 16.0*  HCT 44.6 47.0*  MCV 83.2  --   PLT 296  --    Cardiac Enzymes:  Recent Labs Lab 12/05/12 1232  TROPONINI <0.30    BNP (last 3 results) No results found for this basename: PROBNP,  in the last 8760 hours CBG:  Recent Labs Lab 12/05/12 1425  GLUCAP 95    Radiological Exams on Admission: Ct Head Wo Contrast  12/05/2012   CLINICAL DATA:  Slurred speech and right-sided weakness  EXAM: CT HEAD WITHOUT CONTRAST  TECHNIQUE: Contiguous axial images were obtained from the base of the skull through the vertex without intravenous contrast. Study was obtained within 24 hr of patient's arrival at the emergency department.  COMPARISON:  September 26, 2011  FINDINGS: Mild diffuse atrophy is stable. There is no mass, hemorrhage, extra-axial fluid collection, or midline shift. There is patchy small vessel disease throughout the centra semiovale bilaterally. There is evidence of a prior infarct in the mid left frontal lobe, stable. There is evidence of a focal infarct at the gray lobe which was not present previously and may be recent. There is also evidence of a  small infarct in the anterior left thalamus which was not  present previously.  Elsewhere gray-white compartments are normal.  Bony calvarium appears intact. Mastoid air cells are clear. Maxillary sinuses are hypoplastic. There is opacification in a portion of the left maxillary antrum.  IMPRESSION: Atrophy with small vessel disease in the periventricular region. Prior infarct left frontal lobe. Infarcts in the anterior left parietal lobe and anterior left thalamus which were not present previously and may be fairly recent.  No hemorrhage or mass effect.   Electronically Signed   By: Bretta Bang M.D.   On: 12/05/2012 13:04    EKG: Independently reviewed. AV paced rhythm.  Assessment/Plan Principal Problem:   CVA (cerebral infarction) Active Problems:   HYPERLIPIDEMIA, MIXED   HYPERTENSION   CORONARY ARTERY DISEASE   Atrial fibrillation   Left bundle branch block   Cardiomyopathy, ischemic   Automatic implantable cardioverter-defibrillator in situ   Long term (current) use of anticoagulants   Chronic systolic heart failure   Left brain stroke - Likely embolic - Right-sided weakness, speech and facial asymmetry and has clinically improved - Admit to telemetry for observation and evaluation. - Unable to have MRI secondary to AICD. Repeat CT head on 11/20 1 AM - Complete stroke workup: A1c, fasting lipids, 2-D echo and carotid Dopplers. - PT, OT and ST evaluation. - Coumadin and low dose aspirin held secondary to supratherapeutic INR. To be resumed when INR is better. - Neurology consultation appreciated.  Supratherapeutic INR/A. fib/LBBB/CAD/ischemic cardiomyopathy/AICD/chronic systolic CHF - Hold Coumadin and follow daily INR. INR 4.59 - Stable. - Alert with patient's primary cardiology service of patient's admission.  Hypertension - Controlled      Code Status: Full   Family Communication: Discussed with spouse at bedside   Disposition Plan: Home when medically stable   Time spent: 55 minutes   Raevon Broom, MD, FACP,  FHM. [facility] Pager [phone]  If 7PM-7AM, please contact night-coverage [email or web address] Password [email or web address] 12/05/2012, 4:01 PM

## 2012-12-05 NOTE — Telephone Encounter (Signed)
Follow up  Pt's son call and gave this number for call back.  Please try [phone] when calling back.

## 2012-12-05 NOTE — Telephone Encounter (Signed)
Attempted to call the patient. Line busy x 2 tries. Will call back.

## 2012-12-05 NOTE — Telephone Encounter (Signed)
I spoke with the patient. She states that she fell last night about 8:45 pm. She had been in the bathroom and fell against the wall and bumped her head, then hit her right side and proceeded to the ground. She states she "just didn't feel well, " just prior to that. She denies dizziness, lightheadedness, pre-syncope, or syncope. She states that she felt like her right foot/ leg just wouldn't move. She denies numbness/ tingling to the right side. She has complete sensation to her right side. She is able to lift her right leg while sitting. She states that she has dropped quite a few things when initially picking them up with her right hand this morning. She is on warfarin. She denies a knot to her head. She denies a history of stroke. She does report that she drove her spouse to the doctor yesterday and he reports that she was swerving quite a bit. I advised I will review with Dr. Graciela Husbands for plan and recommendations. I will call her back. She is agreeable.

## 2012-12-05 NOTE — Telephone Encounter (Signed)
Reviewed with Dr. Graciela Husbands, he would like the patient to send in a manuel transmission on her device and then proceed to the ER. [name] Rad, RN, BSN  I spoke with the patient's husband. He reports that EMS has arrived and is going to transport the patient. She has started having slurring of her words and increased weakness on her right side. I have informed Dr. Graciela Husbands of this. [name] Rad, RN, BSN

## 2012-12-05 NOTE — Progress Notes (Signed)
Echo Lab  2D Echocardiogram completed.  Alicia Guerrero L Corliss Lamartina, RDCS 12/05/2012 4:00 PM

## 2012-12-05 NOTE — ED Notes (Signed)
Checked patient blood sugar it was 95 notified RN Raynelle Fanning

## 2012-12-05 NOTE — Telephone Encounter (Signed)
Follow up ° ° ° ° ° °Returning nurses call °

## 2012-12-05 NOTE — Consult Note (Signed)
NEURO HOSPITALIST CONSULT NOTE    Reason for Consult: stroke  HPI:                                                                                                                                          Alicia Guerrero is an 77 y.o. female, left handed, with a past medical history significant for HTN, hyperlipidemia, CAD s/p stent deployment, atrial fibrillation s/p pacemaker placement on chronic coumadin, ischemic cardiomyopathy,  Right breast cancer status lumpectomy, comes in today complaining of right leg weakness, trouble using the right hand, speech changes, and mild right face droopiness. She was last known well last night. She said that around 9 pm she began noticing heaviness/weakness of the right leg, " like the leg was not really there" and at some point she fell and had to stay on the floor for a short while because was having a hard time getting up by herself. Went to bed and when woke up this morning the right leg was weaker and couldn't use the right hand properly.  Husband stated that when he came back from work this morning she was " taking more slowly than usual ,slurring her words, and with some mild droopiness of the right lower face". Never had similar symptoms before. Denies HA, vertigo, double vision, difficulty swallowing, chest pain, shortness or breath, or palpitations. CT brain revealed hypodensities in the left parietal and left thalamus concerning for new strokes but no hemorrhage. Can not have MRI due to pacemaker. INR 4.59   Past Medical History  Diagnosis Date  . Left bundle branch block   . Atrial fibrillation   . CAD (coronary artery disease)     a. s/p stent to RCA 1996;  b. s/p cutting balloon PTCA to LAD 2001; c. s/p Cypher DES to LAD 2003;  d. NSTEMI 4/12: DES to dRCA;  e.  LHC 9/13 (after presentation with cardiac arrest and + Nz's for NSTEMI): pLAD 30% ISR, oD1 30%, dLAD 60-70%, pOM1 (small vessel) 90%, oOM2 95% (moderate to large  vessel), mCFX 30%, pOM3 30-40%, dRCA stents and pPDA stents patent with 30% ISR => med Rx rec.   . Ischemic cardiomyopathy     a. EF 40% at cath 4/12 with AL and Inf HK;  b.  Echo after presentation with cardiac arrest 9/13:  EF 40-45% and severe inf HK to AK c/w infarction, PASP 42  . HLD (hyperlipidemia)   . HTN (hypertension)   . Other atopic dermatitis and related conditions   . Allergic rhinitis due to pollen   . Asthma   . Other and unspecified ovarian cyst   . Personal history of malignant neoplasm of breast   . Personal history of hyperthyroidism   . Chronic eczema  hands  . Hemorrhoids   . Cancer   . Bradycardia     a. coreg tapered off 05/2011 => b. Metoprolol restarted after cardiac arrest 09/2011  . Cardiac arrest 09/2011    a. VT/VF in community; resusc unsuccessful;  PEA in ED; multiple defibs => revived;  [employer];  c/b by VDRF, cardiogenic shock;  LHC with stable anatomy => med Rx;  difficult ween from vent => s/p trach;  d/c to SNF  . Chronic systolic heart failure   . Biventricular ICD (implantable cardiac defibrillator) in place     St Jude 10/2011 (implant MD: Allred)    Past Surgical History  Procedure Laterality Date  . Oophorectomy    . Abdominal hysterectomy    . Ptca    . Cholecystectomy    . Coronary stent placement  may and  july 2012  . Breast lumpectomy  08/24/2003    right lumpectomy+sln,T2N0,ERPR+,Her2-  . Polypectomy    . Colonoscopy    . Video bronchoscopy  10/26/2011    Procedure: VIDEO BRONCHOSCOPY WITHOUT FLUORO;  Surgeon: Nelda Bucks, MD;  Location: [facility];  Service: Endoscopy;  Laterality: Bilateral;    Family History  Problem Relation Age of Onset  . Uterine cancer Mother   . Hypertension Mother   . Coronary artery disease Father   . Heart disease Sister   . Heart disease Brother   . Endometrial cancer Sister   . Heart disease Brother   . Clotting disorder Brother     blood clot  . Colon cancer Neg Hx      Family History:   Social History:  reports that she has never smoked. She has never used smokeless tobacco. She reports that she does not drink alcohol or use illicit drugs.  Allergies  Allergen Reactions  . Meperidine Hcl Swelling    Mixed with phenergan swelling of the mouth   . Penicillins Other (See Comments)    Pt has taken penicillin about 6 years ago with no side effects  . Molds & Smuts Other (See Comments)    Runny nose  . Tape Other (See Comments)    Redness, pulls skin off    MEDICATIONS:                                                                                                                     I have reviewed the patient's current medications.   ROS:  History obtained from the patient, family, and chart review.  General ROS: negative for - chills, fatigue, fever, night sweats, weight gain or weight loss Psychological ROS: negative for - behavioral disorder, hallucinations, memory difficulties, mood swings or suicidal ideation Ophthalmic ROS: negative for - blurry vision, double vision, eye pain or loss of vision ENT ROS: negative for - epistaxis, nasal discharge, oral lesions, sore throat, tinnitus or vertigo Allergy and Immunology ROS: negative for - hives or itchy/watery eyes Hematological and Lymphatic ROS: negative for - bleeding problems, bruising or swollen lymph nodes Endocrine ROS: negative for - galactorrhea, hair pattern changes, polydipsia/polyuria or temperature intolerance Respiratory ROS: negative for - cough, hemoptysis, shortness of breath or wheezing Cardiovascular ROS: negative for - chest pain, dyspnea on exertion, edema or irregular heartbeat Gastrointestinal ROS: negative for - abdominal pain, diarrhea, hematemesis, nausea/vomiting or stool incontinence Genito-Urinary ROS: negative for - dysuria,  hematuria, incontinence or urinary frequency/urgency Musculoskeletal ROS: negative for - joint swelling Neurological ROS: as noted in HPI Dermatological ROS: negative for rash and skin lesion changes   Physical exam: pleasant female in no apparent distress. Blood pressure 173/85, pulse 67, temperature 98.2 F (36.8 C), temperature source Oral, resp. rate 18, height 5\' 3"  (1.6 m), weight 74.844 kg (165 lb), SpO2 97.00%. Head: normocephalic. Neck: supple, no bruits, no JVD. Cardiac: no murmurs. Lungs: clear. Abdomen: soft, no tender, no mass. Extremities: no edema.   Neurologic Examination:                                                                                                      Mental Status: Alert, oriented, thought content appropriate.  Speech fluent without evidence of aphasia.  Able to follow 3 step commands without difficulty. Cranial Nerves: II: Discs flat bilaterally; Visual fields grossly normal, pupils equal, round, reactive to light and accommodation III,IV, VI: ptosis not present, extra-ocular motions intact bilaterally V,VII: smile mildly symmetric due to right lower face weakness, facial light touch sensation normal bilaterally VIII: hearing normal bilaterally IX,X: gag reflex present XI: bilateral shoulder shrug XII: midline tongue extension without atrophy or fasciculations  Motor: Subtle weakness right arm and leg. Decreased rapid alternating movements right hand. Tone and bulk:normal tone throughout; no atrophy noted Sensory: Pinprick and light touch intact throughout, bilaterally Deep Tendon Reflexes:  Couldn't elicit. Plantars: Right: mute   Left: mute Cerebellar: normal finger-to-nose,  normal heel-to-shin test Gait:  No tested. CV: pulses palpable throughout    Lab Results  Component Value Date/Time   CHOL 141 11/07/2011  9:59 AM    Results for orders placed during the hospital encounter of 12/05/12 (from the past 48 hour(s))   PROTIME-INR     Status: Abnormal   Collection Time    12/05/12 12:32 PM      Result Value Range   Prothrombin Time 41.6 (*) 11.6 - 15.2 seconds   INR 4.59 (*) 0.00 - 1.49  APTT     Status: Abnormal   Collection Time    12/05/12 12:32 PM      Result Value Range   aPTT 68 (*)  24 - 37 seconds   Comment:            IF BASELINE aPTT IS ELEVATED,     SUGGEST PATIENT RISK ASSESSMENT     BE USED TO DETERMINE APPROPRIATE     ANTICOAGULANT THERAPY.  CBC     Status: Abnormal   Collection Time    12/05/12 12:32 PM      Result Value Range   WBC 8.8  4.0 - 10.5 K/uL   RBC 5.36 (*) 3.87 - 5.11 MIL/uL   Hemoglobin 15.4 (*) 12.0 - 15.0 g/dL   HCT 16.1  09.6 - 04.5 %   MCV 83.2  78.0 - 100.0 fL   MCH 28.7  26.0 - 34.0 pg   MCHC 34.5  30.0 - 36.0 g/dL   RDW 40.9  81.1 - 91.4 %   Platelets 296  150 - 400 K/uL  DIFFERENTIAL     Status: None   Collection Time    12/05/12 12:32 PM      Result Value Range   Neutrophils Relative % 61  43 - 77 %   Neutro Abs 5.3  1.7 - 7.7 K/uL   Lymphocytes Relative 27  12 - 46 %   Lymphs Abs 2.4  0.7 - 4.0 K/uL   Monocytes Relative 9  3 - 12 %   Monocytes Absolute 0.8  0.1 - 1.0 K/uL   Eosinophils Relative 3  0 - 5 %   Eosinophils Absolute 0.3  0.0 - 0.7 K/uL   Basophils Relative 1  0 - 1 %   Basophils Absolute 0.1  0.0 - 0.1 K/uL  POCT I-STAT TROPONIN I     Status: None   Collection Time    12/05/12 12:52 PM      Result Value Range   Troponin i, poc 0.01  0.00 - 0.08 ng/mL   Comment 3            Comment: Due to the release kinetics of cTnI,     a negative result within the first hours     of the onset of symptoms does not rule out     myocardial infarction with certainty.     If myocardial infarction is still suspected,     repeat the test at appropriate intervals.  POCT I-STAT, CHEM 8     Status: Abnormal   Collection Time    12/05/12 12:54 PM      Result Value Range   Sodium 139  135 - 145 mEq/L   Potassium 4.1  3.5 - 5.1 mEq/L   Chloride 99   96 - 112 mEq/L   BUN 14  6 - 23 mg/dL   Creatinine, Ser 7.82  0.50 - 1.10 mg/dL   Glucose, Bld 956 (*) 70 - 99 mg/dL   Calcium, Ion 2.13  0.86 - 1.30 mmol/L   TCO2 28  0 - 100 mmol/L   Hemoglobin 16.0 (*) 12.0 - 15.0 g/dL   HCT 57.8 (*) 46.9 - 62.9 %  URINALYSIS, ROUTINE W REFLEX MICROSCOPIC     Status: Abnormal   Collection Time    12/05/12  1:05 PM      Result Value Range   Color, Urine YELLOW  YELLOW   APPearance CLEAR  CLEAR   Specific Gravity, Urine 1.005  1.005 - 1.030   pH 6.0  5.0 - 8.0   Glucose, UA NEGATIVE  NEGATIVE mg/dL   Hgb urine dipstick NEGATIVE  NEGATIVE   Bilirubin Urine  NEGATIVE  NEGATIVE   Ketones, ur NEGATIVE  NEGATIVE mg/dL   Protein, ur NEGATIVE  NEGATIVE mg/dL   Urobilinogen, UA 0.2  0.0 - 1.0 mg/dL   Nitrite NEGATIVE  NEGATIVE   Leukocytes, UA TRACE (*) NEGATIVE  URINE MICROSCOPIC-ADD ON     Status: None   Collection Time    12/05/12  1:05 PM      Result Value Range   Squamous Epithelial / LPF RARE  RARE   WBC, UA 0-2  <3 WBC/hpf   Bacteria, UA RARE  RARE    Ct Head Wo Contrast  12/05/2012   CLINICAL DATA:  Slurred speech and right-sided weakness  EXAM: CT HEAD WITHOUT CONTRAST  TECHNIQUE: Contiguous axial images were obtained from the base of the skull through the vertex without intravenous contrast. Study was obtained within 24 hr of patient's arrival at the emergency department.  COMPARISON:  September 26, 2011  FINDINGS: Mild diffuse atrophy is stable. There is no mass, hemorrhage, extra-axial fluid collection, or midline shift. There is patchy small vessel disease throughout the centra semiovale bilaterally. There is evidence of a prior infarct in the mid left frontal lobe, stable. There is evidence of a focal infarct at the gray lobe which was not present previously and may be recent. There is also evidence of a small infarct in the anterior left thalamus which was not present previously.  Elsewhere gray-white compartments are normal.  Bony  calvarium appears intact. Mastoid air cells are clear. Maxillary sinuses are hypoplastic. There is opacification in a portion of the left maxillary antrum.  IMPRESSION: Atrophy with small vessel disease in the periventricular region. Prior infarct left frontal lobe. Infarcts in the anterior left parietal lobe and anterior left thalamus which were not present previously and may be fairly recent.  No hemorrhage or mass effect.   Electronically Signed   By: Bretta Bang M.D.   On: 12/05/2012 13:04       Assessment/Plan: 77 y/o with multiple risk factors for stroke, now with a constellation of symptoms/signs consistent with a left brain stroke, most likely subcortical. CT brain displays probable hypodensitiesin in the left parietal and left thalamus concerning for new strokes but no hemorrhage.   Supra-therapeutic INR 4.59 Recommend: Admission to medicine. No anticoagulants or antiplatelets at this moment due to supra-therapeutic INR. Can not have MRI due to pacemaker and thus will get follow CT brain in the morning. Complete stroke work up.  Wyatt Portela, MD [facility] [phone]  12/05/2012, 1:42 PM

## 2012-12-05 NOTE — Telephone Encounter (Signed)
I attempted to call the patient's home #- still ringing busy. I have left a message on the patient's cell # to call.

## 2012-12-05 NOTE — Telephone Encounter (Signed)
New Problem   Pt called to speak to a RN. Pt fell last night around 8:45pm her right side does not feel right, not moving like it should.

## 2012-12-05 NOTE — ED Notes (Signed)
Pt presents with onset of R sided weakness that she first noted yesterday.  Pt reports she began to "drag" R leg last night, causing her to fall forward, striking her head on the wall.  Pt unsure of LOC, reports going to bed "really early", not sure why.  Pt reports onset of R hand weakness and expressive aphasia that she first noted at 0900 this morning.  Pt reports being "a little" confused.

## 2012-12-05 NOTE — ED Provider Notes (Signed)
CSN: [id number]     Arrival date & time 12/05/12  1205 History   First MD Initiated Contact with Patient 12/05/12 1206     No chief complaint on file.  (Consider location/radiation/quality/duration/timing/severity/associated sxs/prior Treatment) HPI Comments: 77 year old female presents with right-sided weakness and slurred speech. She first noticed that her right leg was weak last night when she is walking to the bathroom. The weakness caused her to fall she said she did sustain some minor trauma to her head. She does not have a current headache. She went back to the bed and fell asleep. This morning she knows that she was dropping things with the right hand the right leg is still weak. Her husband has noticed slurred speech. Do this they called EMS and brought her into the hospital. Patient denies any chest pain, shortness of breath or neck pain. No vomiting. They also noted that the patient was driving yesterday and seemed to be having trouble staying straight on the road and was clipping mailboxes.   Past Medical History  Diagnosis Date  . Left bundle branch block   . Atrial fibrillation   . CAD (coronary artery disease)     a. s/p stent to RCA 1996;  b. s/p cutting balloon PTCA to LAD 2001; c. s/p Cypher DES to LAD 2003;  d. NSTEMI 4/12: DES to dRCA;  e.  LHC 9/13 (after presentation with cardiac arrest and + Nz's for NSTEMI): pLAD 30% ISR, oD1 30%, dLAD 60-70%, pOM1 (small vessel) 90%, oOM2 95% (moderate to large vessel), mCFX 30%, pOM3 30-40%, dRCA stents and pPDA stents patent with 30% ISR => med Rx rec.   . Ischemic cardiomyopathy     a. EF 40% at cath 4/12 with AL and Inf HK;  b.  Echo after presentation with cardiac arrest 9/13:  EF 40-45% and severe inf HK to AK c/w infarction, PASP 42  . HLD (hyperlipidemia)   . HTN (hypertension)   . Other atopic dermatitis and related conditions   . Allergic rhinitis due to pollen   . Asthma   . Other and unspecified ovarian cyst   . Personal  history of malignant neoplasm of breast   . Personal history of hyperthyroidism   . Chronic eczema     hands  . Hemorrhoids   . Cancer   . Bradycardia     a. coreg tapered off 05/2011 => b. Metoprolol restarted after cardiac arrest 09/2011  . Cardiac arrest 09/2011    a. VT/VF in community; resusc unsuccessful;  PEA in ED; multiple defibs => revived;  [employer];  c/b by VDRF, cardiogenic shock;  LHC with stable anatomy => med Rx;  difficult ween from vent => s/p trach;  d/c to SNF  . Chronic systolic heart failure   . Biventricular ICD (implantable cardiac defibrillator) in place     St Jude 10/2011 (implant MD: Allred)   Past Surgical History  Procedure Laterality Date  . Oophorectomy    . Abdominal hysterectomy    . Ptca    . Cholecystectomy    . Coronary stent placement  may and  july 2012  . Breast lumpectomy  08/24/2003    right lumpectomy+sln,T2N0,ERPR+,Her2-  . Polypectomy    . Colonoscopy    . Video bronchoscopy  10/26/2011    Procedure: VIDEO BRONCHOSCOPY WITHOUT FLUORO;  Surgeon: Nelda Bucks, MD;  Location: [facility];  Service: Endoscopy;  Laterality: Bilateral;   Family History  Problem Relation Age of Onset  .  Uterine cancer Mother   . Hypertension Mother   . Coronary artery disease Father   . Heart disease Sister   . Heart disease Brother   . Endometrial cancer Sister   . Heart disease Brother   . Clotting disorder Brother     blood clot  . Colon cancer Neg Hx    History  Substance Use Topics  . Smoking status: Never Smoker   . Smokeless tobacco: Never Used  . Alcohol Use: No   OB History   Grav Para Term Preterm Abortions TAB SAB Ect Mult Living                 Review of Systems  Respiratory: Negative for shortness of breath.   Cardiovascular: Negative for chest pain.  Gastrointestinal: Negative for vomiting.  Neurological: Positive for speech difficulty and weakness. Negative for dizziness, numbness and headaches.  All other  systems reviewed and are negative.    Allergies  Meperidine hcl; Penicillins; Molds & smuts; and Tape  Home Medications   Current Outpatient Rx  Name  Route  Sig  Dispense  Refill  . acetaminophen (TYLENOL) 325 MG tablet   Oral   Take 650 mg by mouth every 6 (six) hours as needed. For pain         . aspirin EC 81 MG tablet   Oral   Take 81 mg by mouth every evening.          Marland Kitchen atorvastatin (LIPITOR) 40 MG tablet   Oral   Take 1 tablet (40 mg total) by mouth daily.   90 tablet   3   . CALCIUM PO   Oral   Take 1 tablet by mouth daily.         . carvedilol (COREG) 25 MG tablet   Oral   Take 1 tablet (25 mg total) by mouth 2 (two) times daily with a meal.   180 tablet   3   . cetirizine (ZYRTEC) 10 MG tablet   Oral   Take 10 mg by mouth as needed for allergies.         . diphenhydrAMINE (BENADRYL) 25 mg capsule   Oral   Take 25 mg by mouth at bedtime as needed. For sleep         . furosemide (LASIX) 40 MG tablet   Oral   Take 1 tablet (40 mg total) by mouth daily.   90 tablet   3   . Multiple Vitamin (MULTIVITAMIN WITH MINERALS) TABS   Oral   Take 1 tablet by mouth daily.         . nitroGLYCERIN (NITROSTAT) 0.4 MG SL tablet   Sublingual   Place 0.4 mg under the tongue every 5 (five) minutes x 3 doses as needed. Chest pain         . potassium chloride SA (K-DUR,KLOR-CON) 20 MEQ tablet   Oral   Take 1 tablet (20 mEq total) by mouth daily.   90 tablet   1   . spironolactone (ALDACTONE) 25 MG tablet   Oral   Take 1 tablet (25 mg total) by mouth daily.   90 tablet   3   . trandolapril ([name]) 4 MG tablet   Oral   Take 1 tablet (4 mg total) by mouth daily.   90 tablet   3   . warfarin (COUMADIN) 5 MG tablet      Take as directed by coumadin clinic   90 tablet   1  This is 3 months supply    BP 145/77  Pulse 61  Temp(Src) 98.2 F (36.8 C) (Oral)  Resp 22  Ht 5\' 3"  (1.6 m)  Wt 165 lb (74.844 kg)  BMI 29.24 kg/m2  SpO2  96% Physical Exam  Vitals reviewed. Constitutional: She is oriented to person, place, and time. She appears well-developed and well-nourished.  HENT:  Head: Normocephalic and atraumatic.  Right Ear: External ear normal.  Left Ear: External ear normal.  Nose: Nose normal.  Eyes: EOM are normal. Pupils are equal, round, and reactive to light. Right eye exhibits no discharge. Left eye exhibits no discharge.  Cardiovascular: Normal rate, regular rhythm and normal heart sounds.   Pulmonary/Chest: Effort normal and breath sounds normal.  Abdominal: Soft. There is no tenderness.  Neurological: She is alert and oriented to person, place, and time. She has normal strength. No cranial nerve deficit or sensory deficit. GCS eye subscore is 4. GCS verbal subscore is 5. GCS motor subscore is 6.  5/5 strength in all 4 extremities  Skin: Skin is warm and dry.    ED Course  Procedures (including critical care time) Labs Review Labs Reviewed  PROTIME-INR - Abnormal; Notable for the following:    Prothrombin Time 41.6 (*)    INR 4.59 (*)    All other components within normal limits  APTT - Abnormal; Notable for the following:    aPTT 68 (*)    All other components within normal limits  CBC - Abnormal; Notable for the following:    RBC 5.36 (*)    Hemoglobin 15.4 (*)    All other components within normal limits  URINALYSIS, ROUTINE W REFLEX MICROSCOPIC - Abnormal; Notable for the following:    Leukocytes, UA TRACE (*)    All other components within normal limits  POCT I-STAT, CHEM 8 - Abnormal; Notable for the following:    Glucose, Bld 117 (*)    Hemoglobin 16.0 (*)    HCT 47.0 (*)    All other components within normal limits  DIFFERENTIAL  URINE MICROSCOPIC-ADD ON  ETHANOL  COMPREHENSIVE METABOLIC PANEL  TROPONIN I  URINE RAPID DRUG SCREEN (HOSP PERFORMED)  POCT I-STAT TROPONIN I   Imaging Review Ct Head Wo Contrast  12/05/2012   CLINICAL DATA:  Slurred speech and right-sided  weakness  EXAM: CT HEAD WITHOUT CONTRAST  TECHNIQUE: Contiguous axial images were obtained from the base of the skull through the vertex without intravenous contrast. Study was obtained within 24 hr of patient's arrival at the emergency department.  COMPARISON:  September 26, 2011  FINDINGS: Mild diffuse atrophy is stable. There is no mass, hemorrhage, extra-axial fluid collection, or midline shift. There is patchy small vessel disease throughout the centra semiovale bilaterally. There is evidence of a prior infarct in the mid left frontal lobe, stable. There is evidence of a focal infarct at the gray lobe which was not present previously and may be recent. There is also evidence of a small infarct in the anterior left thalamus which was not present previously.  Elsewhere gray-white compartments are normal.  Bony calvarium appears intact. Mastoid air cells are clear. Maxillary sinuses are hypoplastic. There is opacification in a portion of the left maxillary antrum.  IMPRESSION: Atrophy with small vessel disease in the periventricular region. Prior infarct left frontal lobe. Infarcts in the anterior left parietal lobe and anterior left thalamus which were not present previously and may be fairly recent.  No hemorrhage or mass effect.   Electronically  Signed   By: Bretta Bang M.D.   On: 12/05/2012 13:04    EKG Interpretation    Date/Time:  Thursday December 05 2012 12:14:17 EST Ventricular Rate:  66 PR Interval:  404 QRS Duration: 140 QT Interval:  477 QTC Calculation: 500 R Axis:   -98 Text Interpretation:  Age not entered, assumed to be  77 years old for purpose of ECG interpretation Atrial-ventricular dual-paced rhythm No acute ischemia No significant change since last tracing Confirmed by Shemuel Harkleroad  MD, Anihya Tuma ([id number]) on 12/05/2012 12:22:08 PM            MDM   1. CVA (cerebral vascular accident)    Patient symptoms seem to be improving, has mild to no weakness in her extremities at  this time. Husband does state she still seems to have some slurring in her speech. All her symptoms started last night which was over 12 hours ago, and that she's not a candidate for a code stroke. Discussed with Dr. Cyril Mourning of neurology and will admit to the hospitalist.    Audree Camel, MD 12/05/12 [phone]

## 2012-12-05 NOTE — ED Notes (Signed)
Report attempted x 1

## 2012-12-05 NOTE — Progress Notes (Signed)
*  PRELIMINARY RESULTS* Vascular Ultrasound Carotid Duplex (Doppler) has been completed.  Preliminary findings: Bilateral:  1-39% ICA stenosis.  Vertebral artery flow is antegrade.      Farrel Demark, RDMS, RVT  12/05/2012, 3:59 PM

## 2012-12-06 ENCOUNTER — Observation Stay (HOSPITAL_COMMUNITY): Payer: Medicare Other

## 2012-12-06 DIAGNOSIS — Z95810 Presence of automatic (implantable) cardiac defibrillator: Secondary | ICD-10-CM

## 2012-12-06 DIAGNOSIS — I779 Disorder of arteries and arterioles, unspecified: Secondary | ICD-10-CM

## 2012-12-06 DIAGNOSIS — I2589 Other forms of chronic ischemic heart disease: Secondary | ICD-10-CM

## 2012-12-06 LAB — LIPID PANEL
Cholesterol: 180 mg/dL (ref 0–200)
HDL: 48 mg/dL (ref >39)
LDL Cholesterol: 97 mg/dL (ref 0–99)
Triglycerides: 176 mg/dL — ABNORMAL HIGH (ref <150)
VLDL: 35 mg/dL (ref 0–40)

## 2012-12-06 LAB — HEMOGLOBIN A1C: Mean Plasma Glucose: 148 mg/dL — ABNORMAL HIGH (ref <117)

## 2012-12-06 LAB — PROTIME-INR: Prothrombin Time: 51.2 seconds — ABNORMAL HIGH (ref 11.6–15.2)

## 2012-12-06 NOTE — Progress Notes (Signed)
PT Cancellation Note  Patient Details Name: CHARLSEY MORAGNE MRN: [id number] DOB: [birth year]   Cancelled Treatment:    Reason Eval/Treat Not Completed: Patient not medically ready.  Pt with INR of 6.02.  Will hold mobility at this time and f/u another time.     Kassie Keng, Alison Murray 12/06/2012, 8:29 AM

## 2012-12-06 NOTE — Progress Notes (Signed)
Subjective: Alicia Guerrero was admitted after having symptoms of right leg collapse with fall preceeded by coordination issues. She presented to [facility] ED > 4 hours after on-set of symptoms. CT did suggest new CVA and she was subsequently admitted. She has been on coumadin and her INR was super-therapeutic at the time of admission. Fortunately her symptoms had also resolved shortly before coming to the ED.  This AM she is awake and alert with no focal complaints.   Objective: Lab:  Recent Labs  12/05/12 1232 12/05/12 1254  WBC 8.8  --   NEUTROABS 5.3  --   HGB 15.4* 16.0*  HCT 44.6 47.0*  MCV 83.2  --   PLT 296  --     Recent Labs  12/05/12 1232 12/05/12 1254  NA 138 139  K 4.1 4.1  CL 99 99  GLUCOSE 111* 117*  BUN 14 14  CREATININE 0.67 1.00  CALCIUM 10.0  --     Imaging: CT 11/20: IMPRESSION:  Atrophy with small vessel disease in the periventricular region.  Prior infarct left frontal lobe. Infarcts in the anterior left  parietal lobe and anterior left thalamus which were not present  previously and may be fairly recent.  No hemorrhage or mass effect.  CT 11/21: IMPRESSION:  1. Evolving left thalamic lacunar infarct (series 2, image 14). This  is favored to be the symptomatic lesion. No mass effect or acute  hemorrhage.  2. Underlying chronic multifocal left MCA territory ischemia.  Anterior left frontal lobe white matter hypodensity surrounding the  chronic left operculum infarct has progressed since 2013, and  subacute ischemia in this region is not excluded.  Scheduled Meds: .  stroke: mapping our early stages of recovery book   Does not apply Once  . atorvastatin  40 mg Oral Daily  . carvedilol  25 mg Oral BID WC  . furosemide  40 mg Oral Daily  . multivitamin with minerals  1 tablet Oral Daily  . potassium chloride SA  20 mEq Oral Daily  . spironolactone  25 mg Oral Daily  . trandolapril  4 mg Oral Daily   Continuous Infusions:  PRN Meds:.acetaminophen,  nitroGLYCERIN, senna-docusate, zolpidem   Physical Exam: Filed Vitals:   12/06/12 0804  BP: 155/82  Pulse: 85  Temp: 98.1 F (36.7 C)  Resp: 18   No intake or output data in the 24 hours ending 12/06/12 0954  Gen'l- WNWD woman who has thinned some in the head,neck,face areas, in on distress HEENT- C&S clear Cor 2+ radial, quiet precordium, RRR Pulm - normal respirations Abd - obese, soft, BS+ Neuro - Awake, alert, oriented x 3; Speech is clear, cognition is normal. CN II-XII - normal facial symmetry, PERRLA, EOMI. MS - MAE to command, holds legs up against resistance, cerebellar - no tremor.      Assessment/Plan: 1. Neuro - new small CVA despite being super-anticoagulated. Her symptoms have resolved. Follow up CT noted, remainder of Stroke work-up in process. She has not been able to work with PT due to high INR.  Plan Complete stroke work up  When INR is down to 3 will restart coumadin  PT/OT prior to d/c home  2. Cardiac - appears stable.   [name] (o) [phone]; (c) [phone] Call-grp - Patsi Sears IM  Tele: [phone]  12/06/2012, 9:51 AM

## 2012-12-06 NOTE — Progress Notes (Signed)
Stroke Team Progress Note  HISTORY Alicia Guerrero is an 77 y.o. female, left handed, with a past medical history significant for HTN, hyperlipidemia, CAD s/p stent deployment, atrial fibrillation s/p pacemaker placement on chronic coumadin, ischemic cardiomyopathy, Right breast cancer status lumpectomy, comes in today 12/05/2012 complaining of right leg weakness, trouble using the right hand, speech changes, and mild right face droopiness.  She was last known well last night 12/04/2012. She said that around 9 pm she began noticing heaviness/weakness of the right leg, " like the leg was not really there" and at some point she fell and had to stay on the floor for a short while because was having a hard time getting up by herself. Went to bed and when woke up this morning the right leg was weaker and couldn't use the right hand properly. Husband stated that when he came back from work this morning she was " taking more slowly than usual ,slurring her words, and with some mild droopiness of the right lower face". Never had similar symptoms before. Denies HA, vertigo, double vision, difficulty swallowing, chest pain, shortness or breath, or palpitations. CT brain revealed hypodensities in the left parietal and left thalamus concerning for new strokes but no hemorrhage. Can not have MRI due to pacemaker. INR 4.59  Patient was not a TPA candidate secondary to delay in arrival, elevated INR. She was admitted for further evaluation and treatment.  SUBJECTIVE Her attending, Dr. Debby Bud is at the bedside.  Overall she feels her condition is completely resolved.   OBJECTIVE Most recent Vital Signs: Filed Vitals:   12/06/12 0400 12/06/12 0500 12/06/12 0600 12/06/12 0804  BP: 129/75 129/75 136/68 155/82  Pulse: 84 100 80 85  Temp: 98.8 F (37.1 C) 98.8 F (37.1 C) 98.3 F (36.8 C) 98.1 F (36.7 C)  TempSrc:  Oral  Oral  Resp: 18  18 18   Height:      Weight:  74.8 kg (164 lb 14.5 oz)    SpO2: 96% 96% 95%     CBG (last 3)   Recent Labs  12/05/12 1425  GLUCAP 95    IV Fluid Intake:     MEDICATIONS  .  stroke: mapping our early stages of recovery book   Does not apply Once  . atorvastatin  40 mg Oral Daily  . carvedilol  25 mg Oral BID WC  . furosemide  40 mg Oral Daily  . multivitamin with minerals  1 tablet Oral Daily  . potassium chloride SA  20 mEq Oral Daily  . spironolactone  25 mg Oral Daily  . trandolapril  4 mg Oral Daily   PRN:  acetaminophen, nitroGLYCERIN, senna-docusate, zolpidem  Diet:  Cardiac thin liquids Activity:  OOB with assistance DVT Prophylaxis:  Supratherapeutic INR on coumadin  CLINICALLY SIGNIFICANT STUDIES Basic Metabolic Panel:  Recent Labs Lab 12/05/12 1232 12/05/12 1254  NA 138 139  K 4.1 4.1  CL 99 99  CO2 29  --   GLUCOSE 111* 117*  BUN 14 14  CREATININE 0.67 1.00  CALCIUM 10.0  --    Liver Function Tests:  Recent Labs Lab 12/05/12 1232  AST 22  ALT 17  ALKPHOS 86  BILITOT 0.7  PROT 7.7  ALBUMIN 4.4   CBC:  Recent Labs Lab 12/05/12 1232 12/05/12 1254  WBC 8.8  --   NEUTROABS 5.3  --   HGB 15.4* 16.0*  HCT 44.6 47.0*  MCV 83.2  --   PLT 296  --  Coagulation:  Recent Labs Lab 12/05/12 1232 12/06/12 0525  LABPROT 41.6* 51.2*  INR 4.59* 6.02*   Cardiac Enzymes:  Recent Labs Lab 12/05/12 1232  TROPONINI <0.30   Urinalysis:  Recent Labs Lab 12/05/12 1305  COLORURINE YELLOW  LABSPEC 1.005  PHURINE 6.0  GLUCOSEU NEGATIVE  HGBUR NEGATIVE  BILIRUBINUR NEGATIVE  KETONESUR NEGATIVE  PROTEINUR NEGATIVE  UROBILINOGEN 0.2  NITRITE NEGATIVE  LEUKOCYTESUR TRACE*   Lipid Panel    Component Value Date/Time   CHOL 141 11/07/2011 0959   TRIG 175.0* 11/07/2011 0959   HDL 49.30 11/07/2011 0959   CHOLHDL 3 11/07/2011 0959   VLDL 35.0 11/07/2011 0959   LDLCALC 57 11/07/2011 0959   HgbA1C  Lab Results  Component Value Date   HGBA1C 6.7* 12/05/2012    Urine Drug Screen:     Component Value Date/Time    LABOPIA NONE DETECTED 12/05/2012 1305   COCAINSCRNUR NONE DETECTED 12/05/2012 1305   LABBENZ NONE DETECTED 12/05/2012 1305   AMPHETMU NONE DETECTED 12/05/2012 1305   THCU NONE DETECTED 12/05/2012 1305   LABBARB NONE DETECTED 12/05/2012 1305    Alcohol Level:  Recent Labs Lab 12/05/12 1232  ETH <11    CT of the brain   12/06/2012    1. Evolving left thalamic lacunar infarct (series 2, image 14). This is favored to be the symptomatic lesion. No mass effect or acute hemorrhage.  2. Underlying chronic multifocal left MCA territory ischemia. Anterior left frontal lobe white matter hypodensity surrounding the chronic left operculum infarct has progressed since 2013, and subacute ischemia in this region is not excluded.   12/05/2012   Atrophy with small vessel disease in the periventricular region. Prior infarct left frontal lobe. Infarcts in the anterior left parietal lobe and anterior left thalamus which were not present previously and may be fairly recent.  No hemorrhage or mass effect.   MRI/MRA of the brain  pacemaker  2D Echocardiogram    Carotid Doppler  No evidence of hemodynamically significant internal carotid artery stenosis. Vertebral artery flow is antegrade.   CXR  12/06/2012    No evidence of active cardiopulmonary disease.     EKG Atrial-ventricular dual-paced rhythm. No acute ischemia. No significant change since last tracing  Therapy Recommendations   Physical Exam   Pleasant elderly lady not in distress.Awake alert. Afebrile. Head is nontraumatic. Neck is supple without bruit. Hearing is normal. Cardiac exam no murmur or gallop. Lungs are clear to auscultation. Distal pulses are well felt. Neurological Exam : Awake alert oriented x 3 normal speech and language. Mild right lower face asymmetry. Tongue midline. No drift. Mild diminished fine finger movements on right. Orbits left over rightupper extremity. Mild right grip weak.. Normal sensation . Normal  coordination. ASSESSMENT Alicia Guerrero is a 77 y.o. female presenting with right leg weakness, trouble using the right hand, speech changes, and mild right face droopiness.  Imaging confirms a new small left thalamic infarct. Infarct felt to be embolic secondary to known atrial fibrillation.  On warfarin prior to admission; INR supratherapeutic on arrival 4.59. Now on no anticoagulants due to continued elevated INR. for secondary stroke prevention. Patient with no resultant neuro deficits, they have resolved. Work up underway.  atrial fibrillation  Left bundle block CAD Ischemic cardiomyopathy Hypertension Hyperlipidemia, LDL 57, on lipitor 40 PTA, now on lipitor 40, goal LDL < 100 (< 70 for diabetics)  Hospital day # 1  TREATMENT/PLAN  Resume  warfarin for secondary stroke prevention once INR normalizes.  Can consider NOAC alternative if pt desires. Dr. Pearlean Brownie discussed with Dr. Debby Bud  F/u 2D  Once 2D read, ok for discharge home from stroke standpoint  Follow up Dr. Pearlean Brownie in 2 months, or as needed  Annie Main, MSN, RN, ANVP-BC, ANP-BC, GNP-BC [facility] Pager: [phone] 12/06/2012 9:20 AM  I have personally obtained a history, examined the patient, evaluated imaging results, and formulated the assessment and plan of care. I agree with the above. Delia Heady, MD
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
## 2012-12-06 NOTE — Progress Notes (Signed)
Received critical alert of INR 6.02 and PT 51.2 . Np on call paged. Will cont to monitor pt.

## 2012-12-06 NOTE — Progress Notes (Signed)
UR completed 

## 2012-12-07 DIAGNOSIS — I5022 Chronic systolic (congestive) heart failure: Secondary | ICD-10-CM

## 2012-12-07 DIAGNOSIS — Z7901 Long term (current) use of anticoagulants: Secondary | ICD-10-CM

## 2012-12-07 DIAGNOSIS — R4182 Altered mental status, unspecified: Secondary | ICD-10-CM

## 2012-12-07 NOTE — Progress Notes (Signed)
PT Cancellation Note  Patient Details Name: Alicia Guerrero MRN: [id number] DOB: [birth year]   Cancelled Treatment:    Reason Eval/Treat Not Completed: Patient not medically ready. Per RN no new INR lab results yet, still awaiting results. Last INR result was 6.02. Will defer till next available time when INR is stable.    Donnamarie Poag [name], [place] [phone] 12/07/2012, 8:49 AM

## 2012-12-07 NOTE — Evaluation (Addendum)
Physical Therapy Evaluation Patient Details Name: Alicia Guerrero MRN: [id number] DOB: 07/29/1935 Today's Date: 12/07/2012 Time: 1208-1222 PT Time Calculation (min): 14 min  PT Assessment / Plan / Recommendation History of Present Illness  Pt is a 77 y.o. female adm secondary to generalized weakness, fall, slurred spech. Testing revealed new subacute infarcts in Lt frontal lobe, anterior Lt parietal lobe and Lt thalamus.   Clinical Impression  Pt adm secondary to above. Presents with limitations in functional mobility secondary to deficits listed below (see PT problem list). Pt to benefit from ambulating with RW to increase stability and decrease risk of falls. Family reports they have one at their house. Will be safe to D/C home with 24/7 (A). Pt encouraged to have all throw rugs put away and (A) to amb initially.     PT Assessment  Patient needs continued PT services    Follow Up Recommendations  Home health PT;Supervision for mobility/OOB    Does the patient have the potential to tolerate intense rehabilitation      Barriers to Discharge        Equipment Recommendations  Cane    Recommendations for Other Services OT consult   Frequency Min 4X/week    Precautions / Restrictions Precautions Precautions: Fall Restrictions Weight Bearing Restrictions: No   Pertinent Vitals/Pain No c/o pain. See VSS      Mobility  Bed Mobility Bed Mobility: Not assessed Details for Bed Mobility Assistance: pt sitting in chair and returned for lunch  Transfers Transfers: Sit to Stand;Stand to Sit Sit to Stand: From chair/3-in-1;5: Supervision;With armrests Stand to Sit: 5: Supervision;To chair/3-in-1;With armrests Details for Transfer Assistance: supervision for safety and cues for safe technique/hand placement  Ambulation/Gait Ambulation/Gait Assistance: 4: Min guard;5: Supervision Ambulation Distance (Feet): 120 Feet Assistive device: Straight cane;1 person hand held  assist Ambulation/Gait Assistance Details: pt min guard with turns; cues for safe technique and use of SPC; pt with increased stability with AD; would benefit from RW to increase stability; no LOB noted but pt unsteady when changing directions  Gait Pattern: Wide base of support;Shuffle Gait velocity: decreased Stairs: No Wheelchair Mobility Wheelchair Mobility: No Modified Rankin (Stroke Patients Only) Pre-Morbid Rankin Score: No symptoms Modified Rankin: Moderately severe disability         PT Diagnosis: Abnormality of gait;Generalized weakness  PT Problem List: Decreased balance;Decreased mobility;Decreased knowledge of use of DME PT Treatment Interventions: DME instruction;Gait training;Functional mobility training;Therapeutic activities;Therapeutic exercise;Balance training;Neuromuscular re-education;Patient/family education     PT Goals(Current goals can be found in the care plan section) Acute Rehab PT Goals Patient Stated Goal: to go home after lunch  PT Goal Formulation: With patient Time For Goal Achievement: 12/14/12 Potential to Achieve Goals: Good  Visit Information  Last PT Received On: 12/07/12 Assistance Needed: +1 History of Present Illness: Pt is a 77 y.o. female adm secondary to generalized weakness, fall, slurred spech. Testing revealed new subacute infarcts in Lt frontal lobe, anterior Lt parietal lobe and Lt thalamus.        Prior Functioning  Home Living Family/patient expects to be discharged to:: Private residence Living Arrangements: Spouse/significant other Available Help at Discharge: Family;Available 24 hours/day Type of Home: House Home Access: Ramped entrance Entrance Stairs-Number of Steps: 2 Home Layout: One level Home Equipment: Walker - 2 wheels;Shower seat Additional Comments: pt has tub and walk in shower;   Lives With: Spouse Prior Function Level of Independence: Independent Communication Communication: No difficulties Dominant  Hand: Left    Cognition  Cognition  Arousal/Alertness: Awake/alert Behavior During Therapy: WFL for tasks assessed/performed Overall Cognitive Status: Within Functional Limits for tasks assessed    Extremity/Trunk Assessment Upper Extremity Assessment Upper Extremity Assessment: Defer to OT evaluation Lower Extremity Assessment Lower Extremity Assessment: RLE deficits/detail RLE Deficits / Details: grossly 3+/5; tends to "give out" per pt Cervical / Trunk Assessment Cervical / Trunk Assessment: Normal   Balance Balance Balance Assessed: Yes Static Standing Balance Static Standing - Balance Support: Left upper extremity supported;During functional activity Static Standing - Level of Assistance: 5: Stand by assistance High Level Balance High Level Balance Activites: Direction changes;Turns High Level Balance Comments: pt unsteady with changes in directions; will benefit from RW to increase stability   End of Session PT - End of Session Equipment Utilized During Treatment: Gait belt Activity Tolerance: Patient tolerated treatment well Patient left: in chair;with call bell/phone within reach;with chair alarm set;with family/visitor present Nurse Communication: Mobility status  GP     Donell Sievert, [place] [phone] 12/07/2012, 2:51 PM

## 2012-12-07 NOTE — Progress Notes (Signed)
   CARE MANAGEMENT NOTE 12/07/2012  Patient:  Alicia Guerrero, Alicia Guerrero   Account Number:  [id number]  Date Initiated:  12/07/2012  Documentation initiated by:  [facility]  Subjective/Objective Assessment:   adm: right leg collapse with fall preceeded by coordination issues     Action/Plan:   discharge planning   Anticipated DC Date:  12/07/2012   Anticipated DC Plan:  HOME W HOME HEALTH SERVICES      DC Planning Services  CM consult      [facility] Choice  HOME HEALTH   Choice offered to / List presented to:  C-1 Patient        HH arranged  HH-1 RN  HH-2 PT  HH-3 OT      [facility] agency  [facility]   Status of service:  Completed, signed off Medicare Important Message given?   (If response is "NO", the following Medicare IM given date fields will be blank) Date Medicare IM given:   Date Additional Medicare IM given:    Discharge Disposition:  HOME W HOME HEALTH SERVICES  Per UR Regulation:    If discussed at Long Length of Stay Meetings, dates discussed:    Comments:  12/07/12 11/: 30 CM spoke with pt in room to offer choice for HHPT/OT/RN and pt chose [facility].  Address and contact numbers were verified.  Referral faxed to [facility] for HHPT/OT/RN. No DME ordered.  No oter CM needs were communicated.  Freddy Jaksch, BSN, CM [phone].

## 2012-12-07 NOTE — Progress Notes (Signed)
Subjective: Having bleeding from a wisdom tooth but otherwise OK. Per Dr. Marlis Edelson note she is ready for d/c  Objective: Lab:  Recent Labs  12/05/12 1232 12/05/12 1254  WBC 8.8  --   NEUTROABS 5.3  --   HGB 15.4* 16.0*  HCT 44.6 47.0*  MCV 83.2  --   PLT 296  --     Recent Labs  12/05/12 1232 12/05/12 1254  NA 138 139  K 4.1 4.1  CL 99 99  GLUCOSE 111* 117*  BUN 14 14  CREATININE 0.67 1.00  CALCIUM 10.0  --     Imaging:  Scheduled Meds: . atorvastatin  40 mg Oral Daily  . carvedilol  25 mg Oral BID WC  . furosemide  40 mg Oral Daily  . multivitamin with minerals  1 tablet Oral Daily  . potassium chloride SA  20 mEq Oral Daily  . spironolactone  25 mg Oral Daily  . trandolapril  4 mg Oral Daily   Continuous Infusions:  PRN Meds:.acetaminophen, nitroGLYCERIN, senna-docusate, zolpidem   Physical Exam: Filed Vitals:   12/07/12 0400  BP: 144/87  Pulse: 73  Temp: 98.4 F (36.9 C)  Resp: 18   See d/c summary     Assessment/Plan: For d/c home. Will need HH PT/OT and RN for lab draw. F-F done Dictated # [id number]   Illene Regulus Ross IM (o) [phone]; (c) [phone] Call-grp - Patsi Sears IM  Tele: [phone]  12/07/2012, 10:12 AM
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
## 2012-12-09 ENCOUNTER — Ambulatory Visit: Payer: Commercial Managed Care - HMO | Admitting: General Practice

## 2012-12-09 ENCOUNTER — Encounter: Payer: Self-pay | Admitting: General Practice

## 2012-12-09 ENCOUNTER — Telehealth: Payer: Self-pay

## 2012-12-09 DIAGNOSIS — Z7901 Long term (current) use of anticoagulants: Secondary | ICD-10-CM

## 2012-12-09 DIAGNOSIS — I4891 Unspecified atrial fibrillation: Secondary | ICD-10-CM

## 2012-12-09 DIAGNOSIS — I2510 Atherosclerotic heart disease of native coronary artery without angina pectoris: Secondary | ICD-10-CM

## 2012-12-09 LAB — POCT INR: INR: 3.3

## 2012-12-09 NOTE — Progress Notes (Signed)
Pre-visit discussion using our clinic review tool. No additional management support is needed unless otherwise documented below in the visit note.  

## 2012-12-09 NOTE — Telephone Encounter (Signed)
Phone call to patient letting her know of your message. She does not have a dental appt yet. She would like the Eliquis sent to [facility] pharmacy in [place]

## 2012-12-09 NOTE — Telephone Encounter (Signed)
Patient was instructed to get an urgent appointment with her dentist in regard to bleeding from an impacted wisdom tooth and to do this prior to resuming coumadin or eliquis ( more likely). If she cannot get a dental appointment in the next day or two then she will be started on NOAC (Eliquis).

## 2012-12-09 NOTE — Telephone Encounter (Signed)
Sheryl nurse with [facility] not sure of phone # she said [phone]?? Patient's Pt 39.7 inr 3.3 Per cynthia boyd start coumadin back tomorrow at 5 mg daily and 2.5 mg Wed repeat on Monday The plan was to do dental work when inr is down. Sheryl would like to know if it's okay to start patient back on coumadin. Patient was advised to hold coumadin until theirs a response

## 2012-12-10 ENCOUNTER — Telehealth: Payer: Self-pay | Admitting: General Practice

## 2012-12-10 ENCOUNTER — Other Ambulatory Visit: Payer: Self-pay | Admitting: General Practice

## 2012-12-10 MED ORDER — APIXABAN 5 MG PO TABS: 5.0000 mg | ORAL_TABLET | Freq: Two times a day (BID) | ORAL | Status: DC

## 2012-12-10 NOTE — Telephone Encounter (Signed)
LMOVM for patient or patient's husband to call Bailey Mech, RN @ Coumadin clinic as soon as possible.

## 2012-12-10 NOTE — Telephone Encounter (Signed)
Spoke with patient.  Patient says that she will not have oral surgery until 12/9.  Informed patient that Eliquis would be called to pharmacy and per Dr. Debby Bud to go ahead and start medication this evening.  Informed patient not to take further doses of coumadin. (Patient has been off coumadin for several days).  Patient verbalized understanding and thanked me for the call.  Patient has appointment with Dr. Debby Bud on Tuesday, 12/2.

## 2012-12-10 NOTE — Telephone Encounter (Signed)
Message copied by Garrison Columbus on Tue Dec 10, 2012  8:05 AM ------      Message from: Illene Regulus E      Created: Mon Dec 09, 2012  5:48 PM       She was just in hospital with new stroke. Dr. Pearlean Brownie felt she would be a good candidate for NOAC once her INR was less than 3. Also she is bleeding from an impacted wisdom tooth. She was to see dentist in the window between lower INR and before starting NOAC in the event she needs oral surgery. So all is well in hand. Thanks      ----- Message -----         From: Garrison Columbus, RN         Sent: 12/09/2012   2:17 PM           To: Jacques Navy, MD            Hey Dr. Debby Bud,            Patient's husband says they have appointment with oral surgeon tomorrow and that patient will start on Eliquis after appointment or on Wednesday.  Is that accurate?  Patient was 3.3 today and HH RN didn't give me any of this information so i did tell them to hold coumadin today.  Is there anything else I need to do for this patient?            Cindy       ------

## 2012-12-11 ENCOUNTER — Ambulatory Visit (INDEPENDENT_AMBULATORY_CARE_PROVIDER_SITE_OTHER): Payer: Medicare Other | Admitting: General Practice

## 2012-12-11 ENCOUNTER — Telehealth: Payer: Self-pay | Admitting: General Practice

## 2012-12-11 NOTE — Telephone Encounter (Signed)
Transitional care call:  Patient dc'd from hospital on 12/07/2012.  DC diagnosis:  CVA - Spoke with patient.  Patient states that she is doing well.  Patient denies question regarding hospital dc instructions.  RN reviewed medications with patient and patient states that she has all medications in the home.  Patient says that she will be having oral surgery to remove infected wisdom tooth in the next few days.  Home health will be going to home for physical therapy.  Patient has follow up appointment with Dr. Debby Bud on  Tuesday, 11/2 @ 11am.  Patient denies question for Dr. Debby Bud at this time.

## 2012-12-17 ENCOUNTER — Ambulatory Visit (INDEPENDENT_AMBULATORY_CARE_PROVIDER_SITE_OTHER): Payer: Medicare Other | Admitting: Internal Medicine

## 2012-12-17 ENCOUNTER — Encounter: Payer: Self-pay | Admitting: Internal Medicine

## 2012-12-17 VITALS — BP 140/72 | HR 78 | Temp 97.9°F | Wt 171.0 lb

## 2012-12-17 VITALS — BP 150/87 | HR 74 | Ht 63.0 in | Wt 168.8 lb

## 2012-12-17 DIAGNOSIS — Z7901 Long term (current) use of anticoagulants: Secondary | ICD-10-CM

## 2012-12-17 DIAGNOSIS — I4891 Unspecified atrial fibrillation: Secondary | ICD-10-CM

## 2012-12-17 DIAGNOSIS — I498 Other specified cardiac arrhythmias: Secondary | ICD-10-CM

## 2012-12-17 DIAGNOSIS — I4901 Ventricular fibrillation: Secondary | ICD-10-CM

## 2012-12-17 DIAGNOSIS — I10 Essential (primary) hypertension: Secondary | ICD-10-CM

## 2012-12-17 DIAGNOSIS — I639 Cerebral infarction, unspecified: Secondary | ICD-10-CM

## 2012-12-17 DIAGNOSIS — I2589 Other forms of chronic ischemic heart disease: Secondary | ICD-10-CM

## 2012-12-17 DIAGNOSIS — R001 Bradycardia, unspecified: Secondary | ICD-10-CM

## 2012-12-17 DIAGNOSIS — I5022 Chronic systolic (congestive) heart failure: Secondary | ICD-10-CM

## 2012-12-17 DIAGNOSIS — I255 Ischemic cardiomyopathy: Secondary | ICD-10-CM

## 2012-12-17 DIAGNOSIS — Z95810 Presence of automatic (implantable) cardiac defibrillator: Secondary | ICD-10-CM

## 2012-12-17 LAB — MDC_IDC_ENUM_SESS_TYPE_INCLINIC
Battery Remaining Longevity: 46.8 mo
Brady Statistic RA Percent Paced: 91 %
Implantable Pulse Generator Model: 3265
Implantable Pulse Generator Serial Number: 7063327
Lead Channel Impedance Value: 612.5 Ohm
Lead Channel Pacing Threshold Amplitude: 0.75 V
Lead Channel Pacing Threshold Amplitude: 0.875 V
Lead Channel Pacing Threshold Amplitude: 2.125 V
Lead Channel Pacing Threshold Pulse Width: 0.5 ms
Lead Channel Pacing Threshold Pulse Width: 0.5 ms
Lead Channel Pacing Threshold Pulse Width: 0.8 ms
Lead Channel Sensing Intrinsic Amplitude: 11.3 mV
Lead Channel Sensing Intrinsic Amplitude: 3.1 mV
Lead Channel Setting Pacing Amplitude: 2 V
Lead Channel Setting Pacing Amplitude: 2 V
Lead Channel Setting Pacing Pulse Width: 0.5 ms
Lead Channel Setting Pacing Pulse Width: 0.8 ms
Zone Setting Detection Interval: 300 ms
Zone Setting Detection Interval: 340 ms

## 2012-12-17 MED ORDER — FLUOCINONIDE 0.05 % EX CREA: 1.0000 "application " | TOPICAL_CREAM | Freq: Two times a day (BID) | CUTANEOUS | Status: DC

## 2012-12-17 NOTE — Assessment & Plan Note (Signed)
90% atrial pacing iwith reasonable heart rate excursion

## 2012-12-17 NOTE — Assessment & Plan Note (Addendum)
Paroxysmal atrial fibrillation.she has had intercurrent dstroke. He is currently on apixaban and we will discontinue her aspirin

## 2012-12-17 NOTE — Patient Instructions (Addendum)
Your physician has recommended you make the following change in your medication:  1) Stop Aspirin 2) Hold Eliquis after Sunday pm dose for tooth surgery  Remote monitoring is used to monitor your Pacemaker of ICD from home. This monitoring reduces the number of office visits required to check your device to one time per year. It allows [place] to keep an eye on the functioning of your device to ensure it is working properly. You are scheduled for a device check from home on 03/19/2013. You may send your transmission at any time that day. If you have a wireless device, the transmission will be sent automatically. After your physician reviews your transmission, you will receive a postcard with your next transmission date.   Your physician wants you to follow-up in: 6 months with Alicia Guerrero, PAC.  You will receive a reminder letter in the mail two months in advance. If you don't receive a letter, please call our office to schedule the follow-up appointment.  Your physician wants you to follow-up in: one year with Alicia Guerrero.  You will receive a reminder letter in the mail two months in advance. If you don't receive a letter, please call our office to schedule the follow-up appointment.

## 2012-12-17 NOTE — Progress Notes (Signed)
Patient Care Team: Jacques Navy, MD as PCP - General   HPI  Alicia Guerrero is a 77 y.o. female Seen in followup for cardiac arrest in the context of coronary artery disease and left bundle branch block. This occurred in the context of coronary artery disease. Her troponins were borderline elevated. There was a prolonged windows and she did not go immediately catheterization. Because of persistent left ventricular dysfunction and no clear trigger for the event he successfully underwent CRT-D implantation. 10/13 by Dr. Marta Antu   She was hospitalized 11/14 for strokes x2 that occurred in the context of a supratherapeutic INR. It was elected to switch her to apixaban. She was discharged on aspirin.  She is gradually recovering. She has some fatigue. He denies chest pain or shortness of breath or above her baseline  Hospital records reviewed    Past Medical History  Diagnosis Date  . Left bundle branch block   . Atrial fibrillation   . CAD (coronary artery disease)     a. s/p stent to RCA 1996;  b. s/p cutting balloon PTCA to LAD 2001; c. s/p Cypher DES to LAD 2003;  d. NSTEMI 4/12: DES to dRCA;  e.  LHC 9/13 (after presentation with cardiac arrest and + Nz's for NSTEMI): pLAD 30% ISR, oD1 30%, dLAD 60-70%, pOM1 (small vessel) 90%, oOM2 95% (moderate to large vessel), mCFX 30%, pOM3 30-40%, dRCA stents and pPDA stents patent with 30% ISR => med Rx rec.   . Ischemic cardiomyopathy     a. EF 40% at cath 4/12 with AL and Inf HK;  b.  Echo after presentation with cardiac arrest 9/13:  EF 40-45% and severe inf HK to AK c/w infarction, PASP 42  . HLD (hyperlipidemia)   . HTN (hypertension)   . Other atopic dermatitis and related conditions   . Allergic rhinitis due to pollen   . Asthma   . Other and unspecified ovarian cyst   . Personal history of malignant neoplasm of breast   . Personal history of hyperthyroidism   . Chronic eczema     hands  . Hemorrhoids   . Cancer   .  Bradycardia     a. coreg tapered off 05/2011 => b. Metoprolol restarted after cardiac arrest 09/2011  . Cardiac arrest 09/2011    a. VT/VF in community; resusc unsuccessful;  PEA in ED; multiple defibs => revived;  [employer];  c/b by VDRF, cardiogenic shock;  LHC with stable anatomy => med Rx;  difficult ween from vent => s/p trach;  d/c to SNF  . Chronic systolic heart failure   . Biventricular ICD (implantable cardiac defibrillator) in place     St Jude 10/2011 (implant MD: Allred)    Past Surgical History  Procedure Laterality Date  . Oophorectomy    . Abdominal hysterectomy    . Ptca    . Cholecystectomy    . Coronary stent placement  may and  july 2012  . Breast lumpectomy  08/24/2003    right lumpectomy+sln,T2N0,ERPR+,Her2-  . Polypectomy    . Colonoscopy    . Video bronchoscopy  10/26/2011    Procedure: VIDEO BRONCHOSCOPY WITHOUT FLUORO;  Surgeon: Nelda Bucks, MD;  Location: [facility];  Service: Endoscopy;  Laterality: Bilateral;    Current Outpatient Prescriptions  Medication Sig Dispense Refill  . acetaminophen (TYLENOL) 325 MG tablet Take 650 mg by mouth every 6 (six) hours as needed. For pain      .  apixaban (ELIQUIS) 5 MG TABS tablet Take 1 tablet (5 mg total) by mouth 2 (two) times daily.  60 tablet  2  . aspirin EC 81 MG tablet Take 81 mg by mouth every evening.       Marland Kitchen atorvastatin (LIPITOR) 40 MG tablet Take 1 tablet (40 mg total) by mouth daily.  90 tablet  3  . CALCIUM PO Take 1 tablet by mouth daily.      . carvedilol (COREG) 25 MG tablet Take 1 tablet (25 mg total) by mouth 2 (two) times daily with a meal.  180 tablet  3  . cetirizine (ZYRTEC) 10 MG tablet Take 10 mg by mouth as needed for allergies.      . diphenhydrAMINE (BENADRYL) 25 mg capsule Take 25 mg by mouth at bedtime as needed. For sleep      . fluocinonide cream (LIDEX) 0.05 % Apply 1 application topically 2 (two) times daily.  60 g  2  . furosemide (LASIX) 40 MG tablet Take 1 tablet  (40 mg total) by mouth daily.  90 tablet  3  . Multiple Vitamin (MULTIVITAMIN WITH MINERALS) TABS Take 1 tablet by mouth daily.      . nitroGLYCERIN (NITROSTAT) 0.4 MG SL tablet Place 0.4 mg under the tongue every 5 (five) minutes x 3 doses as needed for chest pain. Chest pain      . potassium chloride SA (K-DUR,KLOR-CON) 20 MEQ tablet Take 1 tablet (20 mEq total) by mouth daily.  90 tablet  1  . spironolactone (ALDACTONE) 25 MG tablet Take 1 tablet (25 mg total) by mouth daily.  90 tablet  3  . trandolapril ([name]) 4 MG tablet Take 1 tablet (4 mg total) by mouth daily.  90 tablet  3   No current facility-administered medications for this visit.    Allergies  Allergen Reactions  . Meperidine Hcl Swelling    Mixed with phenergan swelling of the mouth   . Penicillins Other (See Comments)    Pt has taken penicillin about 6 years ago with no side effects  . Molds & Smuts Other (See Comments)    Runny nose  . Tape Other (See Comments)    Redness, pulls skin off    Review of Systems negative except from HPI and PMH  Physical Exam BP 150/87  Pulse 74  Ht 5\' 3"  (1.6 m)  Wt 168 lb 12.8 oz (76.567 kg)  BMI 29.91 kg/m2 Well developed and nourished in no acute distress HENT normal Neck supple with JVP-6 Clear Device pocket well healed; without hematoma or erythema.  There is no tethering Regular rate and rhythm, no murmurs or gallops Abd-soft with active BS No Clubbing cyanosis edema Skin-warm and dry A & Oriented  Grossly normal sensory and motor function   ECG demonstrates AV pacing with a right bundle branch block paced configuration  Assessment and  Plan

## 2012-12-17 NOTE — Assessment & Plan Note (Signed)
.  ddfn The patient's device was interrogated.  The information was reviewed. No changes were made in the programming.

## 2012-12-17 NOTE — Assessment & Plan Note (Signed)
No intercurrent Ventricular tachycardia  

## 2012-12-17 NOTE — Assessment & Plan Note (Signed)
Euvolemic. Will continue current meds 

## 2012-12-17 NOTE — Progress Notes (Signed)
Pre visit review using our clinic review tool, if applicable. No additional management support is needed unless otherwise documented below in the visit note. 

## 2012-12-19 NOTE — Progress Notes (Signed)
Subjective:    Patient ID: Alicia Guerrero, female    DOB: June 30, 1935, 77 y.o.   MRN: [id number]  HPI Carney Bern has had a remarkable medical history: CAD with MI, 24 days in ICU and comatose who has made a good recovery. She has a significant ischemic CM and has an AICD. She was hospitalized Nov 20-22 for CVA - hospital d/c summary, labs and imaging reviewed. Since d/c she has done well with no sequelae. She has successfully transitioned to NOAC from coumadin. She did have a bad wisdom tooth with bleeding but this has stablized.  Past Medical History  Diagnosis Date  . Left bundle branch block   . Atrial fibrillation   . CAD (coronary artery disease)     a. s/p stent to RCA 1996;  b. s/p cutting balloon PTCA to LAD 2001; c. s/p Cypher DES to LAD 2003;  d. NSTEMI 4/12: DES to dRCA;  e.  LHC 9/13 (after presentation with cardiac arrest and + Nz's for NSTEMI): pLAD 30% ISR, oD1 30%, dLAD 60-70%, pOM1 (small vessel) 90%, oOM2 95% (moderate to large vessel), mCFX 30%, pOM3 30-40%, dRCA stents and pPDA stents patent with 30% ISR => med Rx rec.   . Ischemic cardiomyopathy     a. EF 40% at cath 4/12 with AL and Inf HK;  b.  Echo after presentation with cardiac arrest 9/13:  EF 40-45% and severe inf HK to AK c/w infarction, PASP 42  . HLD (hyperlipidemia)   . HTN (hypertension)   . Other atopic dermatitis and related conditions   . Allergic rhinitis due to pollen   . Asthma   . Other and unspecified ovarian cyst   . Personal history of malignant neoplasm of breast   . Personal history of hyperthyroidism   . Chronic eczema     hands  . Hemorrhoids   . Cancer   . Bradycardia     a. coreg tapered off 05/2011 => b. Metoprolol restarted after cardiac arrest 09/2011  . Cardiac arrest 09/2011    a. VT/VF in community; resusc unsuccessful;  PEA in ED; multiple defibs => revived;  [employer];  c/b by VDRF, cardiogenic shock;  LHC with stable anatomy => med Rx;  difficult ween from vent => s/p trach;  d/c  to SNF  . Chronic systolic heart failure   . Biventricular ICD (implantable cardiac defibrillator) in place     St Jude 10/2011 (implant MD: Allred)   Past Surgical History  Procedure Laterality Date  . Oophorectomy    . Abdominal hysterectomy    . Ptca    . Cholecystectomy    . Coronary stent placement  may and  july 2012  . Breast lumpectomy  08/24/2003    right lumpectomy+sln,T2N0,ERPR+,Her2-  . Polypectomy    . Colonoscopy    . Video bronchoscopy  10/26/2011    Procedure: VIDEO BRONCHOSCOPY WITHOUT FLUORO;  Surgeon: Nelda Bucks, MD;  Location: [facility];  Service: Endoscopy;  Laterality: Bilateral;   Family History  Problem Relation Age of Onset  . Uterine cancer Mother   . Hypertension Mother   . Coronary artery disease Father   . Heart disease Sister   . Heart disease Brother   . Endometrial cancer Sister   . Heart disease Brother   . Clotting disorder Brother     blood clot  . Colon cancer Neg Hx    History   Social History  . Marital Status: Married  Spouse Name: N/A    Number of Children: 2  . Years of Education: N/A   Occupational History  . Retired     Social History Main Topics  . Smoking status: Never Smoker   . Smokeless tobacco: Never Used  . Alcohol Use: No  . Drug Use: No  . Sexual Activity: Not on file   Other Topics Concern  . Not on file   Social History Narrative   0 caffeine drinks daily     Current Outpatient Prescriptions on File Prior to Visit  Medication Sig Dispense Refill  . acetaminophen (TYLENOL) 325 MG tablet Take 650 mg by mouth every 6 (six) hours as needed. For pain      . apixaban (ELIQUIS) 5 MG TABS tablet Take 1 tablet (5 mg total) by mouth 2 (two) times daily.  60 tablet  2  . atorvastatin (LIPITOR) 40 MG tablet Take 1 tablet (40 mg total) by mouth daily.  90 tablet  3  . CALCIUM PO Take 1 tablet by mouth daily.      . carvedilol (COREG) 25 MG tablet Take 1 tablet (25 mg total) by mouth 2 (two) times daily  with a meal.  180 tablet  3  . cetirizine (ZYRTEC) 10 MG tablet Take 10 mg by mouth as needed for allergies.      . diphenhydrAMINE (BENADRYL) 25 mg capsule Take 25 mg by mouth at bedtime as needed. For sleep      . furosemide (LASIX) 40 MG tablet Take 1 tablet (40 mg total) by mouth daily.  90 tablet  3  . Multiple Vitamin (MULTIVITAMIN WITH MINERALS) TABS Take 1 tablet by mouth daily.      . nitroGLYCERIN (NITROSTAT) 0.4 MG SL tablet Place 0.4 mg under the tongue every 5 (five) minutes x 3 doses as needed for chest pain. Chest pain      . potassium chloride SA (K-DUR,KLOR-CON) 20 MEQ tablet Take 1 tablet (20 mEq total) by mouth daily.  90 tablet  1  . spironolactone (ALDACTONE) 25 MG tablet Take 1 tablet (25 mg total) by mouth daily.  90 tablet  3  . trandolapril ([name]) 4 MG tablet Take 1 tablet (4 mg total) by mouth daily.  90 tablet  3   No current facility-administered medications on file prior to visit.      Review of Systems  Constitutional: Negative for chills, activity change, appetite change and fatigue.  HENT: Negative.   Eyes: Negative.   Respiratory: Negative for apnea, cough, shortness of breath and wheezing.   Cardiovascular: Negative for chest pain and palpitations.  Gastrointestinal: Negative.   Endocrine: Negative.   Genitourinary: Negative.   Musculoskeletal: Positive for arthralgias and back pain.  Skin: Negative.   Allergic/Immunologic: Negative.   Neurological: Negative for dizziness, seizures, facial asymmetry, speech difficulty, weakness, light-headedness and headaches.  Hematological: Negative.   Psychiatric/Behavioral: Negative.        Objective:   Physical Exam Filed Vitals:   12/17/12 1102  BP: 140/72  Pulse: 78  Temp: 97.9 F (36.6 C)   Wt Readings from Last 3 Encounters:  12/17/12 168 lb 12.8 oz (76.567 kg)  12/17/12 171 lb (77.565 kg)  12/06/12 164 lb 14.5 oz (74.8 kg)   BP Readings from Last 3 Encounters:  12/17/12 150/87  12/17/12  140/72  12/07/12 144/87   Gen'l - WNWD woman, overweight but she has lost weight over the last year HEENT - [place]/AT, C&S clear Cor- 2+ radial, RRR Pulm -  normal respirations, CTAP Neuro - A&O x 3, CN II-XII normal with no facial changes, EOMI, normal strength, normal gait and balance.       Assessment & Plan:

## 2012-12-19 NOTE — Assessment & Plan Note (Signed)
BP Readings from Last 3 Encounters:  12/17/12 150/87  12/17/12 140/72  12/07/12 144/87   Borderline OK BP. Will continue present medications

## 2012-12-19 NOTE — Assessment & Plan Note (Signed)
Hospitalized 11/20-11/22/14 for CVA while on coumadin with high INR. Rapid recovery in regard to symptoms. Per neuro consult, Dr. Pearlean Brownie, no additional eval was indicated other than as outlined in d/c summary. She was switched from coumadin to NOAC. On exam today she is doing well and is neurologically in tact.  Plan Continue NOAC  Overall risk reduction to continue  Follow up with Dr. Pearlean Brownie as instructed.

## 2012-12-19 NOTE — Assessment & Plan Note (Signed)
Successfully switched from coumadin to eliquis Nov. 25, 2014. Tolerating treatment well.

## 2013-01-05 DIAGNOSIS — I6350 Cerebral infarction due to unspecified occlusion or stenosis of unspecified cerebral artery: Secondary | ICD-10-CM

## 2013-01-05 DIAGNOSIS — Z7901 Long term (current) use of anticoagulants: Secondary | ICD-10-CM

## 2013-01-05 DIAGNOSIS — I10 Essential (primary) hypertension: Secondary | ICD-10-CM

## 2013-01-27 ENCOUNTER — Telehealth: Payer: Self-pay | Admitting: *Deleted

## 2013-01-27 DIAGNOSIS — I5022 Chronic systolic (congestive) heart failure: Secondary | ICD-10-CM

## 2013-01-27 DIAGNOSIS — I2510 Atherosclerotic heart disease of native coronary artery without angina pectoris: Secondary | ICD-10-CM

## 2013-01-27 DIAGNOSIS — I10 Essential (primary) hypertension: Secondary | ICD-10-CM

## 2013-01-27 DIAGNOSIS — I4891 Unspecified atrial fibrillation: Secondary | ICD-10-CM

## 2013-01-27 DIAGNOSIS — I255 Ischemic cardiomyopathy: Secondary | ICD-10-CM

## 2013-01-27 MED ORDER — POTASSIUM CHLORIDE CRYS ER 20 MEQ PO TBCR: 20.0000 meq | EXTENDED_RELEASE_TABLET | Freq: Every day | ORAL | Status: DC

## 2013-01-27 MED ORDER — TRANDOLAPRIL 4 MG PO TABS: 4.0000 mg | ORAL_TABLET | Freq: Every day | ORAL | Status: DC

## 2013-01-27 MED ORDER — CARVEDILOL 25 MG PO TABS: 25.0000 mg | ORAL_TABLET | Freq: Two times a day (BID) | ORAL | Status: DC

## 2013-01-27 MED ORDER — SPIRONOLACTONE 25 MG PO TABS: 25.0000 mg | ORAL_TABLET | Freq: Every day | ORAL | Status: DC

## 2013-01-27 MED ORDER — FUROSEMIDE 40 MG PO TABS: 40.0000 mg | ORAL_TABLET | Freq: Every day | ORAL | Status: DC

## 2013-01-27 MED ORDER — CETIRIZINE HCL 10 MG PO TABS: 10.0000 mg | ORAL_TABLET | ORAL | Status: DC | PRN

## 2013-01-27 MED ORDER — ATORVASTATIN CALCIUM 40 MG PO TABS: 40.0000 mg | ORAL_TABLET | Freq: Every day | ORAL | Status: DC

## 2013-01-27 NOTE — Telephone Encounter (Signed)
Pt phoned in requesting refills for all of her meds, except for the OTC meds. Last OV with PCP 12/17/12.  Per refill policy, refills were sent to new [facility] mail order pharmacy.

## 2013-01-30 ENCOUNTER — Telehealth: Payer: Self-pay | Admitting: *Deleted

## 2013-01-30 DIAGNOSIS — Z1239 Encounter for other screening for malignant neoplasm of breast: Secondary | ICD-10-CM

## 2013-01-30 NOTE — Telephone Encounter (Signed)
Patient phoned stating she needed a "referral" for her mammogram.  Please advise....   CB# [phone]

## 2013-01-30 NOTE — Telephone Encounter (Signed)
Order placed for mammogram/referral

## 2013-01-30 NOTE — Telephone Encounter (Signed)
Notified patient of MD response & orders.

## 2013-02-11 ENCOUNTER — Telehealth: Payer: Self-pay

## 2013-02-11 NOTE — Telephone Encounter (Signed)
Patient has been advised

## 2013-02-11 NOTE — Telephone Encounter (Signed)
Phone call from patient requesting a referral for pap smear to Dr Raynelle Dick so she can call the office and make her appt.

## 2013-02-11 NOTE — Telephone Encounter (Signed)
With no history of cervical cancer or gyn problems all organizations including [facility] state PAP smears are not indicated after 65. If despite this information she wishes a referral - OK.

## 2013-03-11 ENCOUNTER — Encounter: Payer: Self-pay | Admitting: Internal Medicine

## 2013-03-14 ENCOUNTER — Other Ambulatory Visit: Payer: Self-pay | Admitting: Internal Medicine

## 2013-03-19 ENCOUNTER — Encounter: Payer: Self-pay | Admitting: Internal Medicine

## 2013-03-19 ENCOUNTER — Ambulatory Visit (INDEPENDENT_AMBULATORY_CARE_PROVIDER_SITE_OTHER): Payer: Commercial Managed Care - HMO | Admitting: *Deleted

## 2013-03-19 DIAGNOSIS — I4891 Unspecified atrial fibrillation: Secondary | ICD-10-CM

## 2013-03-19 DIAGNOSIS — I4901 Ventricular fibrillation: Secondary | ICD-10-CM

## 2013-04-06 LAB — MDC_IDC_ENUM_SESS_TYPE_REMOTE
Battery Remaining Longevity: 43 mo
Battery Remaining Percentage: 73 %
Battery Voltage: 2.96 V
Brady Statistic AP VP Percent: 89 %
Brady Statistic AP VS Percent: 1 %
Brady Statistic AS VP Percent: 10 %
Brady Statistic AS VS Percent: 1 %
Brady Statistic RA Percent Paced: 89 %
Date Time Interrogation Session: 20150304070021
HighPow Impedance: 71 Ohm
HighPow Impedance: 71 Ohm
Implantable Pulse Generator Serial Number: 7063327
Lead Channel Impedance Value: 410 Ohm
Lead Channel Impedance Value: 450 Ohm
Lead Channel Impedance Value: 630 Ohm
Lead Channel Pacing Threshold Amplitude: 0.75 V
Lead Channel Pacing Threshold Amplitude: 0.875 V
Lead Channel Pacing Threshold Amplitude: 3.625 V
Lead Channel Pacing Threshold Pulse Width: 0.5 ms
Lead Channel Pacing Threshold Pulse Width: 0.5 ms
Lead Channel Pacing Threshold Pulse Width: 0.8 ms
Lead Channel Sensing Intrinsic Amplitude: 12 mV
Lead Channel Sensing Intrinsic Amplitude: 3 mV
Lead Channel Setting Pacing Amplitude: 2 V
Lead Channel Setting Pacing Amplitude: 2 V
Lead Channel Setting Pacing Amplitude: 4.625
Lead Channel Setting Pacing Pulse Width: 0.5 ms
Lead Channel Setting Pacing Pulse Width: 0.8 ms
Lead Channel Setting Sensing Sensitivity: 0.5 mV
Zone Setting Detection Interval: 300 ms
Zone Setting Detection Interval: 340 ms

## 2013-04-25 ENCOUNTER — Ambulatory Visit (INDEPENDENT_AMBULATORY_CARE_PROVIDER_SITE_OTHER): Payer: Medicare HMO | Admitting: Nurse Practitioner

## 2013-04-25 ENCOUNTER — Encounter: Payer: Self-pay | Admitting: Nurse Practitioner

## 2013-04-25 VITALS — BP 122/76 | HR 69 | Temp 98.8°F | Resp 18 | Ht 63.0 in | Wt 160.0 lb

## 2013-04-25 DIAGNOSIS — R062 Wheezing: Secondary | ICD-10-CM

## 2013-04-25 DIAGNOSIS — J989 Respiratory disorder, unspecified: Secondary | ICD-10-CM

## 2013-04-25 MED ORDER — DOXYCYCLINE HYCLATE 100 MG PO TABS: 100.0000 mg | ORAL_TABLET | Freq: Two times a day (BID) | ORAL | Status: DC

## 2013-04-25 MED ORDER — ALBUTEROL SULFATE (2.5 MG/3ML) 0.083% IN NEBU: INHALATION_SOLUTION | RESPIRATORY_TRACT | Status: DC

## 2013-04-25 MED ORDER — GUAIFENESIN 400 MG PO TABS: ORAL_TABLET | ORAL | Status: DC

## 2013-04-25 NOTE — Patient Instructions (Signed)
I am treating you for pneumonia, although I have not confirmed it by chest xray. Please start antibiotic. Eat yogurt daily, 2 hours after or before you take antibiotic, to help prevent diarrhea. Take humibid daily to help break up congestion. Use nebulizer once daily for next week. Deep breathing & coughing after nebulizer. Start daily sinus rinses Milta Deiters med sinus rinse). Sip fluids every hour. Rest. Follow up 2 weeks or sooner if you feel worse.   Pneumonia, Adult Pneumonia is an infection of the lungs.  CAUSES Pneumonia may be caused by bacteria or a virus. Usually, these infections are caused by breathing infectious particles into the lungs (respiratory tract). SYMPTOMS   Cough.  Fever.  Chest pain.  Increased rate of breathing.  Wheezing.  Mucus production. DIAGNOSIS  If you have the common symptoms of pneumonia, your caregiver will typically confirm the diagnosis with a chest X-ray. The X-ray will show an abnormality in the lung (pulmonary infiltrate) if you have pneumonia. Other tests of your blood, urine, or sputum may be done to find the specific cause of your pneumonia. Your caregiver may also do tests (blood gases or pulse oximetry) to see how well your lungs are working. TREATMENT  Some forms of pneumonia may be spread to other people when you cough or sneeze. You may be asked to wear a mask before and during your exam. Pneumonia that is caused by bacteria is treated with antibiotic medicine. Pneumonia that is caused by the influenza virus may be treated with an antiviral medicine. Most other viral infections must run their course. These infections will not respond to antibiotics.  PREVENTION A pneumococcal shot (vaccine) is available to prevent a common bacterial cause of pneumonia. This is usually suggested for:  People over 21 years old.  Patients on chemotherapy.  People with chronic lung problems, such as bronchitis or emphysema.  People with immune system  problems. If you are over 65 or have a high risk condition, you may receive the pneumococcal vaccine if you have not received it before. In some countries, a routine influenza vaccine is also recommended. This vaccine can help prevent some cases of pneumonia.You may be offered the influenza vaccine as part of your care. If you smoke, it is time to quit. You may receive instructions on how to stop smoking. Your caregiver can provide medicines and counseling to help you quit. HOME CARE INSTRUCTIONS   Cough suppressants may be used if you are losing too much rest. However, coughing protects you by clearing your lungs. You should avoid using cough suppressants if you can.  Your caregiver may have prescribed medicine if he or she thinks your pneumonia is caused by a bacteria or influenza. Finish your medicine even if you start to feel better.  Your caregiver may also prescribe an expectorant. This loosens the mucus to be coughed up.  Only take over-the-counter or prescription medicines for pain, discomfort, or fever as directed by your caregiver.  Do not smoke. Smoking is a common cause of bronchitis and can contribute to pneumonia. If you are a smoker and continue to smoke, your cough may last several weeks after your pneumonia has cleared.  A cold steam vaporizer or humidifier in your room or home may help loosen mucus.  Coughing is often worse at night. Sleeping in a semi-upright position in a recliner or using a couple pillows under your head will help with this.  Get rest as you feel it is needed. Your body will usually let you  know when you need to rest. SEEK IMMEDIATE MEDICAL CARE IF:   Your illness becomes worse. This is especially true if you are elderly or weakened from any other disease.  You cannot control your cough with suppressants and are losing sleep.  You begin coughing up blood.  You develop pain which is getting worse or is uncontrolled with medicines.  You have a  fever.  Any of the symptoms which initially brought you in for treatment are getting worse rather than better.  You develop shortness of breath or chest pain. MAKE SURE YOU:   Understand these instructions.  Will watch your condition.  Will get help right away if you are not doing well or get worse. Document Released: 01/02/2005 Document Revised: 03/27/2011 Document Reviewed: 03/24/2010 [facility] Patient Information 2014 [place], [place].

## 2013-04-25 NOTE — Progress Notes (Signed)
   Subjective:    Patient ID: Alicia Guerrero, female    DOB: 25-Jun-1935, 78 y.o.   MRN: [id number]  Cough This is a new problem. The current episode started in the past 7 days. The problem has been unchanged. The cough is productive of sputum. Associated symptoms include ear congestion, nasal congestion, postnasal drip, rhinorrhea, a sore throat, shortness of breath and wheezing. Pertinent negatives include no chest pain, chills, fever or headaches. The symptoms are aggravated by pollens. She has tried nothing for the symptoms. The treatment provided no relief. Her past medical history is significant for environmental allergies.      Review of Systems  Constitutional: Positive for fatigue. Negative for fever, chills, activity change and appetite change.  HENT: Positive for congestion, postnasal drip, rhinorrhea and sore throat.   Respiratory: Positive for cough, chest tightness, shortness of breath and wheezing.   Cardiovascular: Negative for chest pain.  Gastrointestinal: Negative for nausea, vomiting and diarrhea.  Musculoskeletal: Negative for back pain.  Allergic/Immunologic: Positive for environmental allergies.  Neurological: Negative for headaches.  Psychiatric/Behavioral: Negative for confusion.       Objective:   Physical Exam  Vitals reviewed. Constitutional: She is oriented to person, place, and time. She appears well-developed and well-nourished. No distress.  HENT:  Head: Normocephalic and atraumatic.  Right Ear: External ear normal.  Left Ear: External ear normal.  Mouth/Throat: Oropharynx is clear and moist. No oropharyngeal exudate.  bilat TM effusions, bones visible. Nasal quailty to voice.  Eyes: Conjunctivae are normal. Pupils are equal, round, and reactive to light.  Neck: Normal range of motion. Neck supple. No thyromegaly present.  Cardiovascular: Normal rate, regular rhythm and normal heart sounds.   No murmur heard. Pulmonary/Chest: Effort normal. No  respiratory distress. She has wheezes. She has rales.  Bilateral rales & wheezes bases.  Lymphadenopathy:    She has no cervical adenopathy.  Neurological: She is alert and oriented to person, place, and time.  Skin: Skin is warm and dry.  Psychiatric: She has a normal mood and affect. Her behavior is normal. Thought content normal.          Assessment & Plan:  1. Wheezing DD: pneumonia, allergy induced asthma, viral bronchitis - albuterol (PROVENTIL) (2.5 MG/3ML) 0.083% nebulizer solution; Use 1 to 2 times daily in nebulizer for 5 days, then q8H PRN wheeze.  Dispense: 75 mL; Refill: 12  2. Respiratory illness  - doxycycline (VIBRA-TABS) 100 MG tablet; Take 1 tablet (100 mg total) by mouth 2 (two) times daily.  Dispense: 20 tablet; Refill: 0 - guaifenesin (HUMIBID E) 400 MG TABS tablet; Take 1t po q8h for 5 days then q8h PRN chest congestion  Dispense: 56 tablet; Refill: 0 -albuterol neb 1-2 times daily for 5 days, then as needed q8h.

## 2013-04-25 NOTE — Progress Notes (Signed)
Pre visit review using our clinic review tool, if applicable. No additional management support is needed unless otherwise documented below in the visit note. 

## 2013-05-05 ENCOUNTER — Other Ambulatory Visit: Payer: Self-pay | Admitting: Nurse Practitioner

## 2013-05-05 ENCOUNTER — Telehealth: Payer: Self-pay

## 2013-05-05 ENCOUNTER — Ambulatory Visit: Payer: Medicare HMO | Admitting: Nurse Practitioner

## 2013-05-05 DIAGNOSIS — R05 Cough: Secondary | ICD-10-CM

## 2013-05-05 DIAGNOSIS — R059 Cough, unspecified: Secondary | ICD-10-CM

## 2013-05-05 NOTE — Telephone Encounter (Signed)
Recommend CXR-ask pt where she wants done, AND OV. Please schedule.

## 2013-05-05 NOTE — Telephone Encounter (Signed)
Spoke with pt, put in order for chest Xray to be done at [facility] lab. Pt has an appointment with Layne at 2:00 pm

## 2013-05-05 NOTE — Telephone Encounter (Signed)
Spoke with pt, she is still coughing and states her chest is sore. She is doing the breathing treatments but cannot stop coughing. She doesn't have anymore antibiotic and was wanting to get a refill on it. Please advise next step. Should pt RTC?

## 2013-05-05 NOTE — Progress Notes (Signed)
Pt called complaining of persistenet cough & chest pain. Finished 10d doxy. Will order chest xr & see in ofc tomorrow.

## 2013-05-06 ENCOUNTER — Telehealth: Payer: Self-pay | Admitting: Nurse Practitioner

## 2013-05-06 ENCOUNTER — Ambulatory Visit (INDEPENDENT_AMBULATORY_CARE_PROVIDER_SITE_OTHER)
Admission: RE | Admit: 2013-05-06 | Discharge: 2013-05-06 | Disposition: A | Discharge status: Home / Self Care | Payer: Medicare HMO | Source: Ambulatory Visit | Attending: Nurse Practitioner | Admitting: Nurse Practitioner

## 2013-05-06 ENCOUNTER — Encounter: Payer: Self-pay | Admitting: Nurse Practitioner

## 2013-05-06 ENCOUNTER — Ambulatory Visit (INDEPENDENT_AMBULATORY_CARE_PROVIDER_SITE_OTHER): Payer: Medicare HMO | Admitting: Nurse Practitioner

## 2013-05-06 VITALS — BP 93/59 | HR 62 | Temp 98.2°F | Ht 63.0 in | Wt 159.8 lb

## 2013-05-06 DIAGNOSIS — J989 Respiratory disorder, unspecified: Secondary | ICD-10-CM

## 2013-05-06 DIAGNOSIS — R059 Cough, unspecified: Secondary | ICD-10-CM

## 2013-05-06 DIAGNOSIS — J45909 Unspecified asthma, uncomplicated: Secondary | ICD-10-CM

## 2013-05-06 DIAGNOSIS — R9389 Abnormal findings on diagnostic imaging of other specified body structures: Secondary | ICD-10-CM

## 2013-05-06 DIAGNOSIS — R05 Cough: Secondary | ICD-10-CM

## 2013-05-06 MED ORDER — HYDROCODONE-HOMATROPINE 5-1.5 MG/5ML PO SYRP: 5.0000 mL | ORAL_SOLUTION | Freq: Every evening | ORAL | Status: DC | PRN

## 2013-05-06 MED ORDER — MONTELUKAST SODIUM 10 MG PO TABS: 10.0000 mg | ORAL_TABLET | Freq: Every day | ORAL | Status: DC

## 2013-05-06 NOTE — Progress Notes (Signed)
Pre visit review using our clinic review tool, if applicable. No additional management support is needed unless otherwise documented below in the visit note. 

## 2013-05-06 NOTE — Progress Notes (Signed)
   Subjective:    Patient ID: Alicia Guerrero, female    DOB: November 14, 1935, 78 y.o.   MRN: [id number]  Cough This is a chronic (pt states she has had cough since she was in rehab facility after having MI. Cough worse in last month.) problem. The current episode started 1 to 4 weeks ago (4 weeks). The problem has been waxing and waning. The problem occurs hourly. The cough is productive of sputum. Associated symptoms include nasal congestion, postnasal drip and wheezing. Pertinent negatives include no chest pain, chills, ear congestion, ear pain, fever, headaches, heartburn, hemoptysis, myalgias, sore throat, shortness of breath or weight loss. The symptoms are aggravated by lying down. She has tried a beta-agonist inhaler (mucolytic, 10 days doxycycline) for the symptoms. The treatment provided no relief. Her past medical history is significant for asthma. prolonged hospital stay & rehab stay after MI      Review of Systems  Constitutional: Positive for fatigue. Negative for fever, chills, weight loss, activity change and appetite change.  HENT: Positive for congestion and postnasal drip. Negative for ear pain, sore throat and voice change.   Respiratory: Positive for cough and wheezing. Negative for hemoptysis, chest tightness and shortness of breath.   Cardiovascular: Negative for chest pain.  Gastrointestinal: Negative for heartburn and abdominal pain.  Musculoskeletal: Negative for back pain and myalgias.  Neurological: Negative for headaches.       Objective:   Physical Exam  Vitals reviewed. Constitutional: She is oriented to person, place, and time. She appears well-developed and well-nourished. No distress.  HENT:  Head: Normocephalic and atraumatic.  Right Ear: External ear normal.  Left Ear: External ear normal.  Mouth/Throat: Oropharynx is clear and moist. No oropharyngeal exudate.  Upper dentures in place. Mole-like growth on tongue. Sounds nasal.  Eyes: Conjunctivae are normal.  Right eye exhibits no discharge. Left eye exhibits no discharge.  Neck: Normal range of motion. Neck supple. No thyromegaly present.  Cardiovascular: Normal rate, regular rhythm and normal heart sounds.   No murmur heard. Pulmonary/Chest: Effort normal. No respiratory distress. She has wheezes (clears w/cough).  Cough sounds productive.  Lymphadenopathy:    She has no cervical adenopathy.  Neurological: She is alert and oriented to person, place, and time.  Skin: Skin is warm.  Psychiatric: She has a normal mood and affect. Her behavior is normal. Thought content normal.          Assessment & Plan:  1. Respiratory illness Completed 10 day course doxy. DD: unresolved pneumonia, bronchitis, allergy induced asthma flare  2. Cough Persistent, 1 month CXR today - HYDROcodone-homatropine (HYCODAN) 5-1.5 MG/5ML syrup; Take 5 mLs by mouth at bedtime as needed for cough.  Dispense: 120 mL; Refill: 0  3. Unspecified asthma(493.90) - montelukast (SINGULAIR) 10 MG tablet; Take 1 tablet (10 mg total) by mouth at bedtime.  Dispense: 30 tablet; Refill: 3

## 2013-05-06 NOTE — Patient Instructions (Signed)
Please use nebulizer daily. Use cough syrup at night so you sleep well. Rinse sinuses with Neilmed sinus rinse daily.  I will call you with chest xray results & start antibiotic if needed.

## 2013-05-06 NOTE — Telephone Encounter (Signed)
Density noted RLL on xray. Pt c/o chronic cough since she was in rehab facility, worse in last month. Will order CT scan per radiology recommendation. Discussed w/pt, she is in agreement. Answered all questions.

## 2013-05-07 ENCOUNTER — Telehealth: Payer: Self-pay | Admitting: General Practice

## 2013-05-07 NOTE — Telephone Encounter (Signed)
Blood pressure today, 4/22 @ 11:10 am - 119/70

## 2013-05-09 ENCOUNTER — Ambulatory Visit: Payer: Medicare HMO | Admitting: Nurse Practitioner

## 2013-05-12 ENCOUNTER — Telehealth: Payer: Self-pay | Admitting: *Deleted

## 2013-05-12 ENCOUNTER — Encounter (HOSPITAL_COMMUNITY): Payer: Self-pay

## 2013-05-12 ENCOUNTER — Ambulatory Visit (HOSPITAL_COMMUNITY)
Admission: RE | Admit: 2013-05-12 | Discharge: 2013-05-12 | Disposition: A | Discharge status: Home / Self Care | Payer: Medicare HMO | Source: Ambulatory Visit | Attending: Nurse Practitioner | Admitting: Nurse Practitioner

## 2013-05-12 DIAGNOSIS — R9389 Abnormal findings on diagnostic imaging of other specified body structures: Secondary | ICD-10-CM

## 2013-05-12 DIAGNOSIS — R918 Other nonspecific abnormal finding of lung field: Secondary | ICD-10-CM | POA: Insufficient documentation

## 2013-05-12 NOTE — Telephone Encounter (Signed)
Ref to pulm due to chronic cough & abnml chest CT Pt c/o nausea today. Took meds on empty stomach. Vomited several times. Decreased appetite. Scheduled for appt. Tomorrow at 2 pm. Instructed to go to ER if condition worsens or unable to keep down fluids.

## 2013-05-12 NOTE — Telephone Encounter (Signed)
Patient was calling for CT results. Patient also stated that she has had a lot of nausea and vomiting. Patient believes one or more of her medications is causing the nausea and vomiting. Patient also has no appetite.

## 2013-05-13 NOTE — Telephone Encounter (Signed)
Patient called this morning, she is better & does not need OV today. She said she thinks she may have been sick because she took her medication on an empty stomach.

## 2013-05-15 ENCOUNTER — Encounter: Payer: Self-pay | Admitting: *Deleted

## 2013-05-16 ENCOUNTER — Encounter: Payer: Self-pay | Admitting: Internal Medicine

## 2013-05-16 ENCOUNTER — Ambulatory Visit (INDEPENDENT_AMBULATORY_CARE_PROVIDER_SITE_OTHER): Payer: Commercial Managed Care - HMO | Admitting: Internal Medicine

## 2013-05-16 ENCOUNTER — Other Ambulatory Visit (INDEPENDENT_AMBULATORY_CARE_PROVIDER_SITE_OTHER): Payer: Medicare HMO

## 2013-05-16 ENCOUNTER — Telehealth: Payer: Self-pay | Admitting: Internal Medicine

## 2013-05-16 VITALS — BP 90/60 | HR 66 | Temp 97.7°F | Ht 63.0 in | Wt 156.4 lb

## 2013-05-16 DIAGNOSIS — I10 Essential (primary) hypertension: Secondary | ICD-10-CM

## 2013-05-16 DIAGNOSIS — N189 Chronic kidney disease, unspecified: Secondary | ICD-10-CM

## 2013-05-16 DIAGNOSIS — R0600 Dyspnea, unspecified: Secondary | ICD-10-CM

## 2013-05-16 DIAGNOSIS — I5022 Chronic systolic (congestive) heart failure: Secondary | ICD-10-CM

## 2013-05-16 DIAGNOSIS — I2589 Other forms of chronic ischemic heart disease: Secondary | ICD-10-CM

## 2013-05-16 DIAGNOSIS — R0989 Other specified symptoms and signs involving the circulatory and respiratory systems: Secondary | ICD-10-CM

## 2013-05-16 DIAGNOSIS — I2510 Atherosclerotic heart disease of native coronary artery without angina pectoris: Secondary | ICD-10-CM

## 2013-05-16 DIAGNOSIS — R0609 Other forms of dyspnea: Secondary | ICD-10-CM

## 2013-05-16 DIAGNOSIS — I255 Ischemic cardiomyopathy: Secondary | ICD-10-CM

## 2013-05-16 DIAGNOSIS — R9389 Abnormal findings on diagnostic imaging of other specified body structures: Secondary | ICD-10-CM

## 2013-05-16 DIAGNOSIS — I4891 Unspecified atrial fibrillation: Secondary | ICD-10-CM

## 2013-05-16 LAB — BRAIN NATRIURETIC PEPTIDE: PRO B NATRI PEPTIDE: 52 pg/mL (ref 0.0–100.0)

## 2013-05-16 LAB — BASIC METABOLIC PANEL
BUN: 42 mg/dL — ABNORMAL HIGH (ref 6–23)
BUN: 42 mg/dL — ABNORMAL HIGH (ref 6–23)
CALCIUM: 10.8 mg/dL — AB (ref 8.4–10.5)
CALCIUM: 10.9 mg/dL — AB (ref 8.4–10.5)
CHLORIDE: 99 meq/L (ref 96–112)
CO2: 27 mEq/L (ref 19–32)
CO2: 25 mEq/L (ref 19–32)
CREATININE: 1.7 mg/dL — AB (ref 0.4–1.2)
Chloride: 99 mEq/L (ref 96–112)
Creatinine, Ser: 1.8 mg/dL — ABNORMAL HIGH (ref 0.4–1.2)
GFR: 29.09 mL/min — ABNORMAL LOW (ref >60.00)
GFR: 31.08 mL/min — ABNORMAL LOW (ref >60.00)
Glucose, Bld: 110 mg/dL — ABNORMAL HIGH (ref 70–99)
Glucose, Bld: 108 mg/dL — ABNORMAL HIGH (ref 70–99)
POTASSIUM: 4.6 meq/L (ref 3.5–5.1)
Potassium: 4.7 mEq/L (ref 3.5–5.1)
Sodium: 136 mEq/L (ref 135–145)
Sodium: 137 mEq/L (ref 135–145)

## 2013-05-16 LAB — CBC WITH DIFFERENTIAL/PLATELET
Basophils Absolute: 0.1 10*3/uL (ref 0.0–0.1)
Basophils Relative: 1.1 % (ref 0.0–3.0)
Eosinophils Absolute: 0.3 10*3/uL (ref 0.0–0.7)
Eosinophils Relative: 3.6 % (ref 0.0–5.0)
HCT: 39.2 % (ref 36.0–46.0)
Hemoglobin: 13.3 g/dL (ref 12.0–15.0)
LYMPHS PCT: 23.2 % (ref 12.0–46.0)
Lymphs Abs: 1.8 10*3/uL (ref 0.7–4.0)
MCHC: 34 g/dL (ref 30.0–36.0)
MCV: 84.6 fl (ref 78.0–100.0)
MONOS PCT: 8.6 % (ref 3.0–12.0)
Monocytes Absolute: 0.7 10*3/uL (ref 0.1–1.0)
Neutro Abs: 4.9 10*3/uL (ref 1.4–7.7)
Neutrophils Relative %: 63.5 % (ref 43.0–77.0)
PLATELETS: 263 10*3/uL (ref 150.0–400.0)
RBC: 4.63 Mil/uL (ref 3.87–5.11)
RDW: 13.1 % (ref 11.5–14.6)
WBC: 7.8 10*3/uL (ref 4.5–10.5)

## 2013-05-16 LAB — TSH: TSH: 1.6 u[IU]/mL (ref 0.35–5.50)

## 2013-05-16 MED ORDER — VALSARTAN 80 MG PO TABS: 80.0000 mg | ORAL_TABLET | Freq: Every day | ORAL | Status: DC

## 2013-05-16 MED ORDER — CARVEDILOL 25 MG PO TABS: ORAL_TABLET | ORAL | Status: DC

## 2013-05-16 NOTE — Progress Notes (Signed)
Quick Note:  Spoke with pt and notified of results per Dr. Wert. Pt verbalized understanding and denied any questions.  ______ 

## 2013-05-16 NOTE — Telephone Encounter (Signed)
I spoke with the pt and notified her of lab results  She verbalized understanding  Nothing further needed 

## 2013-05-16 NOTE — Progress Notes (Signed)
Subjective:    Patient ID: Alicia Guerrero, female    DOB: 1935-07-21   MRN: [id number]  HPI  30 yowf never smoker with variable wheezing x decades ago attributed to cat exposure resolved to point where needed no inhaler at all still had some problems with sinus drainage year round and some breathing difficulty around dust or fumes referred by Nicky Pugh PA in [facility] for persistent cough/ sob since Oct 2013 while on ACEi  05/16/2013 1st [place] [facility] office visit/ Donavan Kerlin  Chief Complaint  Patient presents with  . Pulmonary Consult    Referred per Dr. Nicky Pugh for eval of abnormal CT Chest. Pt c/o cough since Oct 2014- prod with clear, foamy sputum.  She c/o DOE with moderate exertion.   pt was on vent p sudden death in Fall 2013 seemed worse p trach came out and coughed every day since then - worse when talking on phone - drinking water makes it better, lying down makes it worse - uses albuterol every other day seems to help some  No obvious other patterns in day to day or daytime variabilty or assoc  cp or chest tightness, subjective wheeze overt sinus or hb symptoms. No unusual exp hx or h/o childhood pna/ asthma or knowledge of premature birth.    Also denies any obvious fluctuation of symptoms with weather or environmental changes or other aggravating or alleviating factors except as outlined above   Current Medications, Allergies, Complete Past Medical History, Past Surgical History, Family History, and Social History were reviewed in [employer] record.             Review of Systems  Constitutional: Negative for fever, chills and unexpected weight change.  HENT: Positive for postnasal drip. Negative for congestion, dental problem, ear pain, nosebleeds, rhinorrhea, sinus pressure, sneezing, sore throat, trouble swallowing and voice change.   Eyes: Negative for visual disturbance.  Respiratory: Positive for cough and shortness of breath. Negative  for choking.   Cardiovascular: Negative for chest pain and leg swelling.  Gastrointestinal: Negative for vomiting, abdominal pain and diarrhea.  Genitourinary: Negative for difficulty urinating.  Musculoskeletal: Negative for arthralgias.  Skin: Negative for rash.  Neurological: Negative for tremors, syncope and headaches.  Hematological: Does not bruise/bleed easily.       Objective:   Physical Exam  Wt Readings from Last 3 Encounters:  05/16/13 156 lb 6.4 oz (70.943 kg)  05/06/13 159 lb 12 oz (72.462 kg)  04/25/13 160 lb (72.576 kg)      HEENT: nl dentition, turbinates, and orophanx. Nl external ear canals without cough reflex   NECK :  without JVD/Nodes/TM/ nl carotid upstrokes bilaterally   LUNGS: no acc muscle use, a few insp crackles both bases  bilaterally without cough on insp or exp maneuvers   CV:  RRR  no s3 or murmur or increase in P2, no edema   ABD:  soft and nontender with nl excursion in the supine position. No bruits or organomegaly, bowel sounds nl  MS:  warm without deformities, calf tenderness, cyanosis or clubbing  SKIN: warm and dry without lesions    NEURO:  alert, approp, no deficits     CT chest w/o contrast 05/12/13  Vague area within the left lung base having the appearance of  interstitial pneumonitis. 5 mm and smaller pulmonary nodules are  appreciated within the right and left lung bases. Differential  considerations interstitial pneumonitis       Assessment &  Plan:

## 2013-05-16 NOTE — Progress Notes (Signed)
Quick Note:  LMTCB ______ 

## 2013-05-16 NOTE — Patient Instructions (Addendum)
Reduce your coreg to one half twice daily   Stop [facility] diovan 80 mg one daily   Please remember to go to the lab and x-ray department downstairs for your tests - we will call you with the results when they are available.  See Dr Caryl Comes next and return here right away  if not happy with breathing/coughing after you see him   Please schedule a follow up visit in 3 months but call sooner if needed with  cxr on return. Late add  Recheck bmet/ esr one week

## 2013-05-17 DIAGNOSIS — R9389 Abnormal findings on diagnostic imaging of other specified body structures: Secondary | ICD-10-CM | POA: Insufficient documentation

## 2013-05-17 DIAGNOSIS — N189 Chronic kidney disease, unspecified: Secondary | ICD-10-CM | POA: Insufficient documentation

## 2013-05-17 NOTE — Assessment & Plan Note (Signed)
Although there are clearly abnormalities on CT scan, they should probably be considered "microscopic" since not obvious on plain cxr .     In the setting of obvious "macroscopic" health issues,  I am very reluctatnt to embark on an invasive w/u at this point but will arrange consevative  follow up and in the meantime see what we can do to address the patient's subjective concerns.   

## 2013-05-17 NOTE — Assessment & Plan Note (Signed)
D/c acei 05/17/2013 due to ? Pseudoasthma   Try diovan 80 mg daily but d/c if creat > 2

## 2013-05-17 NOTE — Assessment & Plan Note (Signed)
Symptoms are markedly disproportionate to objective findings and not clear this is a lung problem but pt does appear to have difficult airway management issues.   DDX of  difficult airways managment all start with A and  include Adherence, Ace Inhibitors, Acid Reflux, Active Sinus Disease, Alpha 1 Antitripsin deficiency, Anxiety masquerading as Airways dz,  ABPA,  allergy(esp in young), Aspiration (esp in elderly), Adverse effects of DPI,  Active smokers, plus two Bs  = Bronchiectasis and Beta blocker use..and one C= CHF  Adherence is always the initial "prime suspect" and is a multilayered concern that requires a "trust but verify" approach in every patient - starting with knowing how to use medications, especially inhalers, correctly, keeping up with refills and understanding the fundamental difference between maintenance and prns vs those medications only taken for a very short course and then stopped and not refilled.   ? ACEi effect > needs trial off   ? chf > excluded by BNP << 100  ? Allergy/ asthma > possible given h/o rhiinitis > eval later and just use saba prn for now  ? Beta blocker effect > bp too low anyway, try reduce coreg and consider  Strongly prefer in this setting: Bystolic, the most beta -1  selective Beta blocker available in sample form, with bisoprolol the most selective generic choice  on the market.   Will regroup in 4 weeks

## 2013-05-17 NOTE — Assessment & Plan Note (Signed)
On ACEi 05/16/13 > changed to diovan 80 mg daily and recheck on week

## 2013-05-19 ENCOUNTER — Telehealth: Payer: Self-pay | Admitting: *Deleted

## 2013-05-19 NOTE — Telephone Encounter (Signed)
TP out of the office on 5/8  Pt scheduled with MW for 5/8 at 11:45

## 2013-05-19 NOTE — Telephone Encounter (Signed)
Message copied by Rosana Berger on Mon May 19, 2013 10:01 AM ------      Message from: Tanda Rockers      Created: Sat May 17, 2013  7:24 PM       Needs bmet/ esr May 8 and f/u bp check with Tammy NP ------

## 2013-05-20 ENCOUNTER — Telehealth: Payer: Self-pay | Admitting: Internal Medicine

## 2013-05-20 NOTE — Telephone Encounter (Signed)
Spoke with pt's husband. MW wanted pt to come back in and have BP rechecked. He is aware of this. Nothing further was needed.

## 2013-05-23 ENCOUNTER — Ambulatory Visit (INDEPENDENT_AMBULATORY_CARE_PROVIDER_SITE_OTHER): Payer: Commercial Managed Care - HMO | Admitting: Internal Medicine

## 2013-05-23 ENCOUNTER — Encounter: Payer: Self-pay | Admitting: Internal Medicine

## 2013-05-23 ENCOUNTER — Other Ambulatory Visit (INDEPENDENT_AMBULATORY_CARE_PROVIDER_SITE_OTHER): Payer: Commercial Managed Care - HMO

## 2013-05-23 VITALS — BP 92/60 | HR 65 | Temp 98.0°F | Ht 63.0 in | Wt 159.0 lb

## 2013-05-23 DIAGNOSIS — N189 Chronic kidney disease, unspecified: Secondary | ICD-10-CM

## 2013-05-23 DIAGNOSIS — R0989 Other specified symptoms and signs involving the circulatory and respiratory systems: Secondary | ICD-10-CM

## 2013-05-23 DIAGNOSIS — R0600 Dyspnea, unspecified: Secondary | ICD-10-CM

## 2013-05-23 DIAGNOSIS — R0609 Other forms of dyspnea: Secondary | ICD-10-CM

## 2013-05-23 DIAGNOSIS — I10 Essential (primary) hypertension: Secondary | ICD-10-CM

## 2013-05-23 LAB — BASIC METABOLIC PANEL
BUN: 23 mg/dL (ref 6–23)
CHLORIDE: 102 meq/L (ref 96–112)
CO2: 29 mEq/L (ref 19–32)
Calcium: 10.3 mg/dL (ref 8.4–10.5)
Creatinine, Ser: 1.6 mg/dL — ABNORMAL HIGH (ref 0.4–1.2)
GFR: 33.57 mL/min — AB (ref >60.00)
GLUCOSE: 102 mg/dL — AB (ref 70–99)
POTASSIUM: 4.8 meq/L (ref 3.5–5.1)
SODIUM: 138 meq/L (ref 135–145)

## 2013-05-23 LAB — SEDIMENTATION RATE: Sed Rate: 25 mm/hr — ABNORMAL HIGH (ref 0–22)

## 2013-05-23 MED ORDER — PREDNISONE 10 MG PO TABS: ORAL_TABLET | ORAL | Status: DC

## 2013-05-23 NOTE — Patient Instructions (Addendum)
Prednisone 10 mg take  4 each am x 2 days,   2 each am x 2 days,  1 each am x 2 days and stop   Stop aldactone for now   Keep appts for follow up cxr as per previous visit unless you feel like you are loosing ground

## 2013-05-23 NOTE — Progress Notes (Signed)
Subjective:    Patient ID: Alicia Guerrero, female    DOB: 08-Sep-1935   MRN: [id number]  HPI  86 yowf never smoker with variable wheezing x decades ago attributed to cat exposure resolved to point where needed no inhaler at all still had some problems with sinus drainage year round and some breathing difficulty around dust or fumes referred by Nicky Pugh PA in [facility] for persistent cough/ sob since Oct 2013 while on ACEi  05/16/2013 1st [place] [facility] office visit/ Alicia Guerrero  Chief Complaint  Patient presents with  . Pulmonary Consult    Referred per Dr. Nicky Pugh for eval of abnormal CT Chest. Pt c/o cough since Oct 2014- prod with clear, foamy sputum.  She c/o DOE with moderate exertion.   pt was on vent p sudden death in Fall 2013 seemed worse p trach came out and coughed every day since then - worse when talking on phone - drinking water makes it better, lying down makes it worse - uses albuterol every other day seems to help some rec Reduce your coreg to one half twice daily  Stop [facility] diovan 80 mg one daily  See Dr Caryl Comes next and return here right away  if not happy with breathing/coughing after you see him   05/23/2013 f/u ov/Alicia Guerrero re: f/u cough/ hbp off acei  Chief Complaint  Patient presents with  . Follow-up    Pt states that her BP has been doing well. She also has had less cough since last visit. No new co's today.     Denies orthostatic complaints - Not limited by breathing from desired activities    No obvious day to day or daytime variabilty or assoc   cp or chest tightness, subjective wheeze overt sinus or hb symptoms. No unusual exp hx or h/o childhood pna/ asthma or knowledge of premature birth.  Sleeping ok without nocturnal  or early am exacerbation  of respiratory  c/o's or need for noct saba. Also denies any obvious fluctuation of symptoms with weather or environmental changes or other aggravating or alleviating factors except as outlined above    Current Medications, Allergies, Complete Past Medical History, Past Surgical History, Family History, and Social History were reviewed in [employer] record.  ROS  The following are not active complaints unless bolded sore throat, dysphagia, dental problems, itching, sneezing,  nasal congestion or excess/ purulent secretions, ear ache,   fever, chills, sweats, unintended wt loss, pleuritic or exertional cp, hemoptysis,  orthopnea pnd or leg swelling, presyncope, palpitations, heartburn, abdominal pain, anorexia, nausea, vomiting, diarrhea  or change in bowel or urinary habits, change in stools or urine, dysuria,hematuria,  rash, arthralgias, visual complaints, headache, numbness weakness or ataxia or problems with walking or coordination,  change in mood/affect or memory.         Objective:   Physical Exam  05/23/2013         159  Wt Readings from Last 3 Encounters:  05/16/13 156 lb 6.4 oz (70.943 kg)  05/06/13 159 lb 12 oz (72.462 kg)  04/25/13 160 lb (72.576 kg)      HEENT: nl dentition, turbinates, and orophanx. Nl external ear canals without cough reflex   NECK :  without JVD/Nodes/TM/ nl carotid upstrokes bilaterally   LUNGS: no acc muscle use, a few insp crackles both bases  bilaterally without cough on insp or exp maneuvers   CV:  RRR  no s3 or murmur or increase in P2, no  edema   ABD:  soft and nontender with nl excursion in the supine position. No bruits or organomegaly, bowel sounds nl  MS:  warm without deformities, calf tenderness, cyanosis or clubbing  SKIN: warm and dry without lesions    NEURO:  alert, approp, no deficits     CT chest w/o contrast 05/12/13  Vague area within the left lung base having the appearance of  interstitial pneumonitis. 5 mm and smaller pulmonary nodules are  appreciated within the right and left lung bases. Differential  considerations interstitial pneumonitis     Recent Labs Lab 05/23/13 1115  NA 138   K 4.8  CL 102  CO2 29  BUN 23  CREATININE 1.6*  GLUCOSE 102*     Lab Results  Component Value Date   ESRSEDRATE 25* 05/23/2013           Assessment & Plan:

## 2013-05-24 NOTE — Assessment & Plan Note (Signed)
-   trial off acei 05/16/2013 for ? Pseudoasthma > improved  Doubt the changes on CT are really contributing to her symptoms but will need f/u at next ov

## 2013-05-24 NOTE — Assessment & Plan Note (Signed)
On ACEi 05/16/13 > changed to diovan 80 mg daily   Lab Results  Component Value Date   CREATININE 1.6* 05/23/2013   CREATININE 1.7* 05/16/2013   CREATININE 1.8* 05/16/2013   CREATININE 1.00 12/05/2012   CREATININE 0.8 08/01/2012     No worse on arb than acei > continue diovan but d/c aldactone due to low bp

## 2013-05-24 NOTE — Assessment & Plan Note (Signed)
Over treated at present so rec d/c aldactone/ continue all other meds the same    Each maintenance medication was reviewed in detail including most importantly the difference between maintenance and as needed and under what circumstances the prns are to be used.  Please see instructions for details which were reviewed in writing and the patient given a copy.

## 2013-06-04 ENCOUNTER — Ambulatory Visit (INDEPENDENT_AMBULATORY_CARE_PROVIDER_SITE_OTHER): Payer: Commercial Managed Care - HMO | Admitting: Internal Medicine

## 2013-06-04 ENCOUNTER — Encounter: Payer: Self-pay | Admitting: Internal Medicine

## 2013-06-04 VITALS — BP 120/68 | HR 76 | Temp 99.2°F | Resp 16 | Wt 162.8 lb

## 2013-06-04 DIAGNOSIS — Z23 Encounter for immunization: Secondary | ICD-10-CM

## 2013-06-04 DIAGNOSIS — I5022 Chronic systolic (congestive) heart failure: Secondary | ICD-10-CM

## 2013-06-04 DIAGNOSIS — I6350 Cerebral infarction due to unspecified occlusion or stenosis of unspecified cerebral artery: Secondary | ICD-10-CM

## 2013-06-04 DIAGNOSIS — I10 Essential (primary) hypertension: Secondary | ICD-10-CM

## 2013-06-04 DIAGNOSIS — I2510 Atherosclerotic heart disease of native coronary artery without angina pectoris: Secondary | ICD-10-CM

## 2013-06-04 DIAGNOSIS — I4901 Ventricular fibrillation: Secondary | ICD-10-CM

## 2013-06-04 DIAGNOSIS — I639 Cerebral infarction, unspecified: Secondary | ICD-10-CM

## 2013-06-04 DIAGNOSIS — J45909 Unspecified asthma, uncomplicated: Secondary | ICD-10-CM

## 2013-06-04 MED ORDER — VITAMIN D 1000 UNITS PO TABS: 1000.0000 [IU] | ORAL_TABLET | Freq: Every day | ORAL | Status: AC

## 2013-06-04 MED ORDER — POTASSIUM CHLORIDE CRYS ER 20 MEQ PO TBCR: 20.0000 meq | EXTENDED_RELEASE_TABLET | Freq: Every day | ORAL | Status: DC

## 2013-06-04 MED ORDER — NITROGLYCERIN 0.4 MG SL SUBL: 0.4000 mg | SUBLINGUAL_TABLET | SUBLINGUAL | Status: DC | PRN

## 2013-06-04 NOTE — Assessment & Plan Note (Signed)
Continue with current prescription therapy as reflected on the Med list.  

## 2013-06-04 NOTE — Assessment & Plan Note (Signed)
Continue with current prescription therapy as reflected on the Med list. On Eloquis

## 2013-06-04 NOTE — Progress Notes (Signed)
Pre visit review using our clinic review tool, if applicable. No additional management support is needed unless otherwise documented below in the visit note. 

## 2013-06-04 NOTE — Assessment & Plan Note (Signed)
Chronic Better off [name] Continue with current prescription therapy as reflected on the Med list.

## 2013-06-04 NOTE — Assessment & Plan Note (Signed)
defibrilator 2013

## 2013-06-04 NOTE — Progress Notes (Signed)
Subjective:     HPI New pt - transferring from Dr Linda Hedges   Hx: Alicia Guerrero has had a remarkable medical history: CAD with MI 09/2011: 24 days in ICU and comatose who has made a good recovery. She has a significant ischemic CM and has an AICD. She was hospitalized Nov 20-22 for CVA - hospital d/c summary, labs and imaging reviewed. Since d/c she has done well with no sequelae. She has successfully transitioned to NOAC from coumadin. She did have a bad wisdom tooth with bleeding but this has stablized.  Past Medical History   Diagnosis  Date   .  Left bundle branch block    .  Atrial fibrillation    .  CAD (coronary artery disease)      a. s/p stent to RCA 1996; b. s/p cutting balloon PTCA to LAD 2001; c. s/p Cypher DES to LAD 2003; d. NSTEMI 4/12: DES to dRCA; e. [place] 9/13 (after presentation with cardiac arrest and + Nz's for NSTEMI): pLAD 30% ISR, oD1 30%, dLAD 60-70%, pOM1 (small vessel) 90%, oOM2 95% (moderate to large vessel), mCFX 30%, pOM3 30-40%, dRCA stents and pPDA stents patent with 30% ISR => med Rx rec.   .  Ischemic cardiomyopathy      a. EF 40% at cath 4/12 with AL and Inf HK; b. Echo after presentation with cardiac arrest 9/13: EF 40-45% and severe inf HK to AK c/w infarction, PASP 42   .  HLD (hyperlipidemia)    .  HTN (hypertension)    .  Other atopic dermatitis and related conditions    .  Allergic rhinitis due to pollen    .  Asthma    .  Other and unspecified ovarian cyst    .  Personal history of malignant neoplasm of breast    .  Personal history of hyperthyroidism    .  Chronic eczema      hands   .  Hemorrhoids    .  Cancer    .  Bradycardia      a. coreg tapered off 05/2011 => b. Metoprolol restarted after cardiac arrest 09/2011   .  Cardiac arrest  09/2011     a. VT/VF in community; resusc unsuccessful; PEA in ED; multiple defibs => revived; [employer]; c/b by VDRF, cardiogenic shock; LHC with stable anatomy => med Rx; difficult ween from vent => s/p trach;  d/c to SNF   .  Chronic systolic heart failure    .  Biventricular ICD (implantable cardiac defibrillator) in place      St Jude 10/2011 (implant MD: Allred)    Past Surgical History   Procedure  Laterality  Date   .  Oophorectomy     .  Abdominal hysterectomy     .  Ptca     .  Cholecystectomy     .  Coronary stent placement   may and july 2012   .  Breast lumpectomy   08/24/2003     right lumpectomy+sln,T2N0,ERPR+,Her2-   .  Polypectomy     .  Colonoscopy     .  Video bronchoscopy   10/26/2011     Procedure: VIDEO BRONCHOSCOPY WITHOUT FLUORO; Surgeon: Raylene Miyamoto, MD; Location: [facility] ENDOSCOPY; Service: Endoscopy; Laterality: Bilateral;    Family History   Problem  Relation  Age of Onset   .  Uterine cancer  Mother    .  Hypertension  Mother    .  Coronary  artery disease  Father    .  Heart disease  Sister    .  Heart disease  Brother    .  Endometrial cancer  Sister    .  Heart disease  Brother    .  Clotting disorder  Brother      blood clot   .  Colon cancer  Neg Hx     History    Social History   .  Marital Status:  Married     Spouse Name:  N/A     Number of Children:  2   .  Years of Education:  N/A    Occupational History   .  Retired     Social History Main Topics   .  Smoking status:  Never Smoker   .  Smokeless tobacco:  Never Used   .  Alcohol Use:  No   .  Drug Use:  No   .  Sexual Activity:  Not on file    Other Topics  Concern   .  Not on file    Social History Narrative    0 caffeine drinks daily    Current Outpatient Prescriptions on File Prior to Visit   Medication  Sig  Dispense  Refill   .  acetaminophen (TYLENOL) 325 MG tablet  Take 650 mg by mouth every 6 (six) hours as needed. For pain     .  apixaban (ELIQUIS) 5 MG TABS tablet  Take 1 tablet (5 mg total) by mouth 2 (two) times daily.  60 tablet  2   .  atorvastatin (LIPITOR) 40 MG tablet  Take 1 tablet (40 mg total) by mouth daily.  90 tablet  3   .  CALCIUM PO  Take 1 tablet by  mouth daily.     .  carvedilol (COREG) 25 MG tablet  Take 1 tablet (25 mg total) by mouth 2 (two) times daily with a meal.  180 tablet  3   .  cetirizine (ZYRTEC) 10 MG tablet  Take 10 mg by mouth as needed for allergies.     .  diphenhydrAMINE (BENADRYL) 25 mg capsule  Take 25 mg by mouth at bedtime as needed. For sleep     .  furosemide (LASIX) 40 MG tablet  Take 1 tablet (40 mg total) by mouth daily.  90 tablet  3   .  Multiple Vitamin (MULTIVITAMIN WITH MINERALS) TABS  Take 1 tablet by mouth daily.     .  nitroGLYCERIN (NITROSTAT) 0.4 MG SL tablet  Place 0.4 mg under the tongue every 5 (five) minutes x 3 doses as needed for chest pain. Chest pain     .  potassium chloride SA (K-DUR,KLOR-CON) 20 MEQ tablet  Take 1 tablet (20 mEq total) by mouth daily.  90 tablet  1   .  spironolactone (ALDACTONE) 25 MG tablet  Take 1 tablet (25 mg total) by mouth daily.  90 tablet  3   .  trandolapril ([name]) 4 MG tablet  Take 1 tablet (4 mg total) by mouth daily.  90 tablet  3    BP Readings from Last 3 Encounters:  06/04/13 120/68  05/23/13 92/60  05/16/13 90/60   Wt Readings from Last 3 Encounters:  06/04/13 162 lb 12 oz (73.823 kg)  05/23/13 159 lb (72.122 kg)  05/16/13 156 lb 6.4 oz (70.943 kg)      Review of Systems  Constitutional: Negative for chills, activity change, appetite change,  fatigue and unexpected weight change.  HENT: Negative for congestion, mouth sores and sinus pressure.   Eyes: Negative for visual disturbance.  Respiratory: Negative for cough and chest tightness.   Gastrointestinal: Negative for nausea and abdominal pain.  Genitourinary: Negative for frequency, difficulty urinating and vaginal pain.  Musculoskeletal: Negative for back pain and gait problem.  Skin: Negative for pallor and rash.  Neurological: Negative for dizziness, tremors, weakness, numbness and headaches.  Psychiatric/Behavioral: Positive for decreased concentration. Negative for suicidal ideas,  behavioral problems, confusion, sleep disturbance and agitation. The patient is not nervous/anxious.        Objective:   Physical Exam  Constitutional: She appears well-developed. No distress.  Obese NAD  HENT:  Head: Normocephalic.  Right Ear: External ear normal.  Left Ear: External ear normal.  Nose: Nose normal.  Mouth/Throat: Oropharynx is clear and moist.  Eyes: Conjunctivae are normal. Pupils are equal, round, and reactive to light. Right eye exhibits no discharge. Left eye exhibits no discharge.  Neck: Normal range of motion. Neck supple. No JVD present. No tracheal deviation present. No thyromegaly present.  Cardiovascular: Normal rate, regular rhythm and normal heart sounds.   Pulmonary/Chest: No stridor. No respiratory distress. She has no wheezes.  Abdominal: Soft. Bowel sounds are normal. She exhibits no distension and no mass. There is no tenderness. There is no rebound and no guarding.  Musculoskeletal: She exhibits no edema and no tenderness.  Lymphadenopathy:    She has no cervical adenopathy.  Neurological: She displays normal reflexes. No cranial nerve deficit. She exhibits normal muscle tone. Coordination normal.  Skin: No rash noted. No erythema.  Psychiatric: She has a normal mood and affect. Her behavior is normal. Judgment and thought content normal.    Lab Results  Component Value Date   WBC 7.8 05/16/2013   HGB 13.3 05/16/2013   HCT 39.2 05/16/2013   PLT 263.0 05/16/2013   GLUCOSE 102* 05/23/2013   CHOL 180 12/06/2012   TRIG 176* 12/06/2012   HDL 48 12/06/2012   LDLDIRECT 91.1 03/21/2010   LDLCALC 97 12/06/2012   ALT 17 12/05/2012   AST 22 12/05/2012   NA 138 05/23/2013   K 4.8 05/23/2013   CL 102 05/23/2013   CREATININE 1.6* 05/23/2013   BUN 23 05/23/2013   CO2 29 05/23/2013   TSH 1.60 05/16/2013   INR 3.3 12/09/2012   HGBA1C 6.8* 12/06/2012    A complex case     Assessment & Plan:

## 2013-06-18 ENCOUNTER — Telehealth: Payer: Self-pay | Admitting: Internal Medicine

## 2013-06-18 NOTE — Telephone Encounter (Signed)
Updated pt her transmission will automatically send.

## 2013-06-18 NOTE — Telephone Encounter (Signed)
New message     Need to know how to do a remote pacer check scheuled for monday

## 2013-06-23 ENCOUNTER — Encounter: Payer: Self-pay | Admitting: Internal Medicine

## 2013-06-23 ENCOUNTER — Ambulatory Visit (INDEPENDENT_AMBULATORY_CARE_PROVIDER_SITE_OTHER): Payer: Commercial Managed Care - HMO | Admitting: *Deleted

## 2013-06-23 DIAGNOSIS — I5022 Chronic systolic (congestive) heart failure: Secondary | ICD-10-CM | POA: Diagnosis not present

## 2013-06-23 DIAGNOSIS — I498 Other specified cardiac arrhythmias: Secondary | ICD-10-CM

## 2013-06-23 DIAGNOSIS — I447 Left bundle-branch block, unspecified: Secondary | ICD-10-CM

## 2013-06-23 DIAGNOSIS — R001 Bradycardia, unspecified: Secondary | ICD-10-CM

## 2013-06-23 DIAGNOSIS — I2589 Other forms of chronic ischemic heart disease: Secondary | ICD-10-CM | POA: Diagnosis not present

## 2013-06-23 DIAGNOSIS — I4891 Unspecified atrial fibrillation: Secondary | ICD-10-CM | POA: Diagnosis not present

## 2013-06-23 DIAGNOSIS — I255 Ischemic cardiomyopathy: Secondary | ICD-10-CM

## 2013-06-23 DIAGNOSIS — I4901 Ventricular fibrillation: Secondary | ICD-10-CM

## 2013-06-23 LAB — MDC_IDC_ENUM_SESS_TYPE_REMOTE
Battery Remaining Longevity: 2.6
Brady Statistic RA Percent Paced: 89 %
Brady Statistic RV Percent Paced: 99 %
HIGH POWER IMPEDANCE MEASURED VALUE: 61 Ohm
Lead Channel Impedance Value: 560 Ohm
Lead Channel Pacing Threshold Amplitude: 0.875 V
Lead Channel Pacing Threshold Amplitude: 1.125 V
Lead Channel Pacing Threshold Pulse Width: 0.5 ms
Lead Channel Pacing Threshold Pulse Width: 0.5 ms
Lead Channel Sensing Intrinsic Amplitude: 2.9 mV
Lead Channel Setting Pacing Amplitude: 2 V
Lead Channel Setting Pacing Amplitude: 2 V
Lead Channel Setting Pacing Amplitude: 4.625
Lead Channel Setting Pacing Pulse Width: 0.5 ms
Lead Channel Setting Pacing Pulse Width: 0.8 ms
Lead Channel Setting Sensing Sensitivity: 0.5 mV
MDC IDC MSMT BATTERY REMAINING PERCENTAGE: 68 %
MDC IDC MSMT LEADCHNL RA IMPEDANCE VALUE: 350 Ohm
MDC IDC MSMT LEADCHNL RV IMPEDANCE VALUE: 400 Ohm
MDC IDC MSMT LEADCHNL RV SENSING INTR AMPL: 7.7 mV
MDC IDC PG MODEL: 3265
MDC IDC PG SERIAL: 7063327
MDC IDC SET ZONE DETECTION INTERVAL: 300 ms
Zone Setting Detection Interval: 340 ms

## 2013-06-23 NOTE — Progress Notes (Signed)
Remote ICD transmission.   

## 2013-07-16 ENCOUNTER — Ambulatory Visit (INDEPENDENT_AMBULATORY_CARE_PROVIDER_SITE_OTHER): Payer: Commercial Managed Care - HMO | Admitting: *Deleted

## 2013-07-16 ENCOUNTER — Encounter: Payer: Self-pay | Admitting: Internal Medicine

## 2013-07-16 DIAGNOSIS — I2589 Other forms of chronic ischemic heart disease: Secondary | ICD-10-CM

## 2013-07-16 DIAGNOSIS — I5022 Chronic systolic (congestive) heart failure: Secondary | ICD-10-CM

## 2013-07-16 DIAGNOSIS — I255 Ischemic cardiomyopathy: Secondary | ICD-10-CM

## 2013-07-16 LAB — MDC_IDC_ENUM_SESS_TYPE_INCLINIC
Battery Remaining Longevity: 28.8 mo
Brady Statistic RA Percent Paced: 88 %
HighPow Impedance: 61.875
Implantable Pulse Generator Serial Number: 7063327
Lead Channel Impedance Value: 400 Ohm
Lead Channel Impedance Value: 437.5 Ohm
Lead Channel Pacing Threshold Amplitude: 1 V
Lead Channel Pacing Threshold Amplitude: 2.25 V
Lead Channel Pacing Threshold Pulse Width: 0.5 ms
Lead Channel Pacing Threshold Pulse Width: 0.8 ms
Lead Channel Pacing Threshold Pulse Width: 1.5 ms
Lead Channel Sensing Intrinsic Amplitude: 11.3 mV
Lead Channel Sensing Intrinsic Amplitude: 3.1 mV
Lead Channel Setting Pacing Amplitude: 2 V
Lead Channel Setting Pacing Amplitude: 2 V
Lead Channel Setting Pacing Amplitude: 3.25 V
Lead Channel Setting Pacing Pulse Width: 0.5 ms
Lead Channel Setting Pacing Pulse Width: 1.5 ms
Lead Channel Setting Sensing Sensitivity: 0.5 mV
MDC IDC MSMT LEADCHNL LV IMPEDANCE VALUE: 537.5 Ohm
MDC IDC MSMT LEADCHNL LV PACING THRESHOLD AMPLITUDE: 3.5 V
MDC IDC MSMT LEADCHNL RV PACING THRESHOLD AMPLITUDE: 0.75 V
MDC IDC MSMT LEADCHNL RV PACING THRESHOLD PULSEWIDTH: 0.5 ms
MDC IDC PG MODEL: 3265
MDC IDC SESS DTM: 20150701110914
MDC IDC SET ZONE DETECTION INTERVAL: 300 ms
MDC IDC SET ZONE DETECTION INTERVAL: 340 ms
MDC IDC STAT BRADY RV PERCENT PACED: 99 %

## 2013-07-16 NOTE — Progress Notes (Signed)
CRT-D device check in office. Thresholds and sensing consistent with previous device measurements.  No LV capture mid2->RVcoil.  Reprogrammed proximal 4->RV coil with a pw of 1.31msec.   Lead impedance trends stable over time. 2 mode switch episodes recorded, <1%, + eliquis. No ventricular arrhythmia episodes recorded. Patient bi-ventricularly pacing >100 % of the time. Device programmed with appropriate safety margins. Heart failure diagnostics reviewed.  CorVue abnormal throughout the month of May.   Audible/vibratory alerts demonstrated for patient.  Estimated longevity 2.4 years.  Patient enrolled in remote follow up. Plan to check device remotely in 3 months and see in office in 6 months. Patient education completed including shock plan.  She continues to c/o muscle aches off of the atorvastatin.  [place]lin 10/20/13.

## 2013-07-24 ENCOUNTER — Telehealth: Payer: Self-pay | Admitting: Internal Medicine

## 2013-07-24 NOTE — Telephone Encounter (Signed)
New message     Talk to [place].  She is supposed to be checking with the doctor about patient taking lipitor 40mg .

## 2013-07-24 NOTE — Telephone Encounter (Signed)
After consulting with device tech I informed pt that she could start taking lipitor 40 mg again. Pt verbalized understanding.

## 2013-08-11 ENCOUNTER — Encounter (INDEPENDENT_AMBULATORY_CARE_PROVIDER_SITE_OTHER): Payer: Self-pay | Admitting: General Surgery

## 2013-08-11 ENCOUNTER — Other Ambulatory Visit: Payer: Self-pay

## 2013-08-11 DIAGNOSIS — I5022 Chronic systolic (congestive) heart failure: Secondary | ICD-10-CM

## 2013-08-11 MED ORDER — POTASSIUM CHLORIDE CRYS ER 20 MEQ PO TBCR: 20.0000 meq | EXTENDED_RELEASE_TABLET | Freq: Every day | ORAL | Status: DC

## 2013-08-15 ENCOUNTER — Ambulatory Visit (INDEPENDENT_AMBULATORY_CARE_PROVIDER_SITE_OTHER)
Admission: RE | Admit: 2013-08-15 | Discharge: 2013-08-15 | Disposition: A | Discharge status: Home / Self Care | Payer: Commercial Managed Care - HMO | Source: Ambulatory Visit | Attending: Internal Medicine | Admitting: Internal Medicine

## 2013-08-15 ENCOUNTER — Telehealth: Payer: Self-pay | Admitting: Internal Medicine

## 2013-08-15 ENCOUNTER — Encounter: Payer: Self-pay | Admitting: Internal Medicine

## 2013-08-15 ENCOUNTER — Other Ambulatory Visit (INDEPENDENT_AMBULATORY_CARE_PROVIDER_SITE_OTHER): Payer: Commercial Managed Care - HMO

## 2013-08-15 ENCOUNTER — Ambulatory Visit (INDEPENDENT_AMBULATORY_CARE_PROVIDER_SITE_OTHER): Payer: Commercial Managed Care - HMO | Admitting: Internal Medicine

## 2013-08-15 VITALS — BP 128/64 | HR 60 | Temp 98.1°F | Ht 63.0 in | Wt 166.0 lb

## 2013-08-15 DIAGNOSIS — R9389 Abnormal findings on diagnostic imaging of other specified body structures: Secondary | ICD-10-CM

## 2013-08-15 DIAGNOSIS — J45909 Unspecified asthma, uncomplicated: Secondary | ICD-10-CM

## 2013-08-15 DIAGNOSIS — R0600 Dyspnea, unspecified: Secondary | ICD-10-CM

## 2013-08-15 DIAGNOSIS — I10 Essential (primary) hypertension: Secondary | ICD-10-CM

## 2013-08-15 DIAGNOSIS — R0989 Other specified symptoms and signs involving the circulatory and respiratory systems: Secondary | ICD-10-CM

## 2013-08-15 DIAGNOSIS — R0609 Other forms of dyspnea: Secondary | ICD-10-CM

## 2013-08-15 LAB — CBC WITH DIFFERENTIAL/PLATELET
BASOS PCT: 0.8 % (ref 0.0–3.0)
Basophils Absolute: 0.1 10*3/uL (ref 0.0–0.1)
Eosinophils Absolute: 0.5 10*3/uL (ref 0.0–0.7)
Eosinophils Relative: 6.1 % — ABNORMAL HIGH (ref 0.0–5.0)
HCT: 39.8 % (ref 36.0–46.0)
Hemoglobin: 13 g/dL (ref 12.0–15.0)
Lymphocytes Relative: 23.9 % (ref 12.0–46.0)
Lymphs Abs: 1.8 10*3/uL (ref 0.7–4.0)
MCHC: 32.7 g/dL (ref 30.0–36.0)
MCV: 84.8 fl (ref 78.0–100.0)
MONO ABS: 0.8 10*3/uL (ref 0.1–1.0)
MONOS PCT: 10.3 % (ref 3.0–12.0)
NEUTROS PCT: 58.9 % (ref 43.0–77.0)
Neutro Abs: 4.6 10*3/uL (ref 1.4–7.7)
Platelets: 273 10*3/uL (ref 150.0–400.0)
RBC: 4.7 Mil/uL (ref 3.87–5.11)
RDW: 13.4 % (ref 11.5–15.5)
WBC: 7.7 10*3/uL (ref 4.0–10.5)

## 2013-08-15 LAB — BASIC METABOLIC PANEL
BUN: 20 mg/dL (ref 6–23)
CHLORIDE: 103 meq/L (ref 96–112)
CO2: 31 meq/L (ref 19–32)
Calcium: 9.6 mg/dL (ref 8.4–10.5)
Creatinine, Ser: 1 mg/dL (ref 0.4–1.2)
GFR: 58.65 mL/min — ABNORMAL LOW (ref >60.00)
Glucose, Bld: 120 mg/dL — ABNORMAL HIGH (ref 70–99)
Potassium: 4.3 mEq/L (ref 3.5–5.1)
SODIUM: 139 meq/L (ref 135–145)

## 2013-08-15 LAB — SEDIMENTATION RATE: SED RATE: 14 mm/h (ref 0–22)

## 2013-08-15 NOTE — Progress Notes (Signed)
Subjective:    Patient ID: Alicia Guerrero, female    DOB: June 16, 1935   MRN: [id number]  Brief patient profile:  35 yowf never smoker with variable wheezing x decades ago attributed to cat exposure resolved to point where needed no inhaler at all still had some problems with sinus drainage year round and some breathing difficulty around dust or fumes referred to pulmonary clinic 05/16/13  by Nicky Pugh PA in [facility] for persistent cough/ sob since Oct 2013 while on ACEi   History of Present Illness  05/16/2013 1st [place] [facility] office visit/ Evon Dejarnett  Chief Complaint  Patient presents with  . Pulmonary Consult    Referred per Dr. Nicky Pugh for eval of abnormal CT Chest. Pt c/o cough since Oct 2014- prod with clear, foamy sputum.  She c/o DOE with moderate exertion.   pt was on vent p sudden death in Fall 2013 seemed worse p trach came out and coughed every day since then - worse when talking on phone - drinking water makes it better, lying down makes it worse - uses albuterol every other day seems to help some rec Reduce your coreg to one half twice daily  Stop [facility] diovan 80 mg one daily     05/23/2013 f/u ov/Cleburn Maiolo re: f/u cough/ hbp off acei  Chief Complaint  Patient presents with  . Follow-up    Pt states that her BP has been doing well. She also has had less cough since last visit. No new co's today.  Denies orthostatic complaints - Not limited by breathing from desired activities   rec Prednisone 10 mg take  4 each am x 2 days,   2 each am x 2 days,  1 each am x 2 days and stop  Stop aldactone for now    08/15/2013 f/u ov/Rick Carruthers re: cough resolved off acei Chief Complaint  Patient presents with  . Follow-up    Pt states that her cough is much improved. Breathing is doing well.  No new co's today.    some nasal drip/ pnds controlled on zyrtec  Not limited by breathing from desired activities   No swelling of ext or orthopnea off aldactone No need for saba in any form  now    No obvious day to day or daytime variabilty or assoc   cp or chest tightness, subjective wheeze overt sinus or hb symptoms. No unusual exp hx or h/o childhood pna/ asthma or knowledge of premature birth.  Sleeping ok without nocturnal  or early am exacerbation  of respiratory  c/o's or need for noct saba. Also denies any obvious fluctuation of symptoms with weather or environmental changes or other aggravating or alleviating factors except as outlined above   Current Medications, Allergies, Complete Past Medical History, Past Surgical History, Family History, and Social History were reviewed in [employer] record.  ROS  The following are not active complaints unless bolded sore throat, dysphagia, dental problems, itching, sneezing,  nasal congestion or excess/ purulent secretions, ear ache,   fever, chills, sweats, unintended wt loss, pleuritic or exertional cp, hemoptysis,  orthopnea pnd or leg swelling, presyncope, palpitations, heartburn, abdominal pain, anorexia, nausea, vomiting, diarrhea  or change in bowel or urinary habits, change in stools or urine, dysuria,hematuria,  rash, arthralgias, visual complaints, headache, numbness weakness or ataxia or problems with walking or coordination,  change in mood/affect or memory.         Objective:   Physical Exam  05/23/2013  159 > 08/15/2013   166  Wt Readings from Last 3 Encounters:  05/16/13 156 lb 6.4 oz (70.943 kg)  05/06/13 159 lb 12 oz (72.462 kg)  04/25/13 160 lb (72.576 kg)      HEENT: nl dentition, turbinates, and orophanx. Nl external ear canals without cough reflex   NECK :  without JVD/Nodes/TM/ nl carotid upstrokes bilaterally   LUNGS: no acc muscle use, No sign  crackles both bases  bilaterally without cough on insp or exp maneuvers   CV:  RRR  no s3 or murmur or increase in P2, no edema   ABD:  soft and nontender with nl excursion in the supine position. No bruits or organomegaly,  bowel sounds nl  MS:  warm without deformities, calf tenderness, cyanosis or clubbing      CT chest w/o contrast 05/12/13  Vague area within the left lung base having the appearance of  interstitial pneumonitis. 5 mm and smaller pulmonary nodules are  appreciated within the right and left lung bases. Differential  considerations interstitial pneumonitis    08/15/13 CXR No active cardiopulmonary disease.  Previously noted faint nodular opacity over the right mid to lower  lung not visualized on the current exam.    Recent Labs Lab 08/15/13 0948  NA 139  K 4.3  CL 103  CO2 31  BUN 20  CREATININE 1.0  GLUCOSE 120*    Recent Labs Lab 08/15/13 0948  HGB 13.0  HCT 39.8  WBC 7.7  PLT 273.0     Lab Results  Component Value Date   ESRSEDRATE 14 08/15/2013   ESRSEDRATE 25* 05/23/2013           Assessment & Plan:

## 2013-08-15 NOTE — Progress Notes (Signed)
Quick Note:  Spoke with pt and notified of results per Dr. Wert. Pt verbalized understanding and denied any questions.  ______ 

## 2013-08-15 NOTE — Telephone Encounter (Signed)
Patient only has a week supply of potassium left.  She states that she needs a refill sent to [facility] mail order pharmacy.  Last seen Dr. Alain Marion in May.  Patient states Pharmacy has tried for 3 weeks to contact provider.

## 2013-08-15 NOTE — Patient Instructions (Addendum)
Please remember to go to the lab   department downstairs for your tests - we will call you with the results when they are available.  Please schedule a follow up visit in 6  months but call sooner if needed with cxr on return

## 2013-08-15 NOTE — Assessment & Plan Note (Signed)
D/c acei 05/17/2013 due to ? Pseudoasthma > improved 05/23/13 > resolved 08/15/2013

## 2013-08-15 NOTE — Telephone Encounter (Signed)
Called pt spoke with husband inform him rx was fax to [facility] on Monday 08/11/13...Alicia Guerrero

## 2013-08-16 NOTE — Assessment & Plan Note (Signed)
-   trial off acei 05/16/2013 for ? Pseudoasthma > improved 05/23/13 > resolved 08/15/13

## 2013-08-16 NOTE — Assessment & Plan Note (Signed)
See CT 05/12/13 > not apparent on cxr 08/15/13  Discussed in detail all the  indications, usual  risks and alternatives  relative to the benefits with patient who agrees to proceed with conservative f/u with cxr in 6 months as very low risk

## 2013-08-16 NOTE — Assessment & Plan Note (Signed)
Now off acei no need at all for saba in any form

## 2013-08-18 ENCOUNTER — Telehealth: Payer: Self-pay | Admitting: Internal Medicine

## 2013-08-18 NOTE — Telephone Encounter (Signed)
Relevant patient education assigned to patient using Emmi. ° °

## 2013-08-26 ENCOUNTER — Encounter (INDEPENDENT_AMBULATORY_CARE_PROVIDER_SITE_OTHER): Payer: Self-pay | Admitting: General Surgery

## 2013-09-18 ENCOUNTER — Telehealth: Payer: Self-pay | Admitting: Internal Medicine

## 2013-09-18 ENCOUNTER — Ambulatory Visit (INDEPENDENT_AMBULATORY_CARE_PROVIDER_SITE_OTHER): Payer: Commercial Managed Care - HMO | Admitting: General Surgery

## 2013-09-18 MED ORDER — APIXABAN 5 MG PO TABS: ORAL_TABLET | ORAL | Status: DC

## 2013-09-18 NOTE — Telephone Encounter (Signed)
Called pt to verify pharmacy pt want med sent to [facility]. Inform her will send...Johny Chess

## 2013-09-18 NOTE — Telephone Encounter (Signed)
Pt called in and said that she needs a refill for ELIQUIS 5 MG TABS tablet.  She stated that the pharmicy has faxed a request over a couple of times and she is completely out.  Will not be home til after 4 today she has other dr appts.    Thank You!!!

## 2013-10-19 ENCOUNTER — Encounter: Payer: Self-pay | Admitting: Internal Medicine

## 2013-10-20 ENCOUNTER — Ambulatory Visit (INDEPENDENT_AMBULATORY_CARE_PROVIDER_SITE_OTHER): Payer: Commercial Managed Care - HMO | Admitting: *Deleted

## 2013-10-20 DIAGNOSIS — I255 Ischemic cardiomyopathy: Secondary | ICD-10-CM

## 2013-10-20 DIAGNOSIS — I5022 Chronic systolic (congestive) heart failure: Secondary | ICD-10-CM

## 2013-10-20 LAB — MDC_IDC_ENUM_SESS_TYPE_REMOTE
Battery Remaining Longevity: 29 mo
Battery Voltage: 2.92 V
Brady Statistic AP VS Percent: 1 %
Brady Statistic AS VP Percent: 11 %
Brady Statistic RA Percent Paced: 86 %
Date Time Interrogation Session: 20151004132114
HIGH POWER IMPEDANCE MEASURED VALUE: 71 Ohm
HighPow Impedance: 71 Ohm
Lead Channel Impedance Value: 440 Ohm
Lead Channel Impedance Value: 590 Ohm
Lead Channel Pacing Threshold Amplitude: 3 V
Lead Channel Pacing Threshold Pulse Width: 0.5 ms
Lead Channel Pacing Threshold Pulse Width: 0.5 ms
Lead Channel Pacing Threshold Pulse Width: 1.5 ms
Lead Channel Sensing Intrinsic Amplitude: 11.3 mV
Lead Channel Sensing Intrinsic Amplitude: 2.6 mV
Lead Channel Setting Pacing Amplitude: 2 V
Lead Channel Setting Pacing Pulse Width: 1.5 ms
Lead Channel Setting Sensing Sensitivity: 0.5 mV
MDC IDC MSMT BATTERY REMAINING PERCENTAGE: 60 %
MDC IDC MSMT LEADCHNL RA IMPEDANCE VALUE: 390 Ohm
MDC IDC MSMT LEADCHNL RA PACING THRESHOLD AMPLITUDE: 1 V
MDC IDC MSMT LEADCHNL RV PACING THRESHOLD AMPLITUDE: 0.875 V
MDC IDC PG SERIAL: 7063327
MDC IDC SET LEADCHNL LV PACING AMPLITUDE: 4 V
MDC IDC SET LEADCHNL RV PACING AMPLITUDE: 2 V
MDC IDC SET LEADCHNL RV PACING PULSEWIDTH: 0.5 ms
MDC IDC SET ZONE DETECTION INTERVAL: 300 ms
MDC IDC STAT BRADY AP VP PERCENT: 86 %
MDC IDC STAT BRADY AS VS PERCENT: 1 %
Zone Setting Detection Interval: 340 ms

## 2013-10-20 NOTE — Progress Notes (Signed)
Remote ICD transmission.   

## 2013-11-04 ENCOUNTER — Encounter: Payer: Self-pay | Admitting: Cardiology

## 2013-12-18 ENCOUNTER — Ambulatory Visit (INDEPENDENT_AMBULATORY_CARE_PROVIDER_SITE_OTHER): Payer: Commercial Managed Care - HMO | Admitting: Family

## 2013-12-18 ENCOUNTER — Encounter: Payer: Self-pay | Admitting: Family

## 2013-12-18 VITALS — BP 172/80 | HR 62 | Temp 98.0°F | Resp 18 | Ht 63.0 in | Wt 174.0 lb

## 2013-12-18 DIAGNOSIS — M25559 Pain in unspecified hip: Secondary | ICD-10-CM | POA: Insufficient documentation

## 2013-12-18 DIAGNOSIS — M25551 Pain in right hip: Secondary | ICD-10-CM

## 2013-12-18 NOTE — Patient Instructions (Signed)
Thank you for choosing [facility].  Summary/Instructions:  If your symptoms worsen or fail to improve, please contact our office for further instruction, or in case of emergency go directly to the emergency room at the closest medical facility.   Stretch 3-4 times per day holding each stretch for about 30 seconds each stretch.  Ice / heat as needed for about 20 minutes 2-3 times per day or as needed   Trochanteric Bursitis You have hip pain due to trochanteric bursitis. Bursitis means that the sack near the outside of the hip is filled with fluid and inflamed. This sack is made up of protective soft tissue. The pain from trochanteric bursitis can be severe and keep you from sleep. It can radiate to the buttocks or down the outside of the thigh to the knee. The pain is almost always worse when rising from the seated or lying position and with walking. Pain can improve after you take a few steps. It happens more often in people with hip joint and lumbar spine problems, such as arthritis or previous surgery. Very rarely the trochanteric bursa can become infected, and antibiotics and/or surgery may be needed. Treatment often includes an injection of local anesthetic mixed with cortisone medicine. This medicine is injected into the area where it is most tender over the hip. Repeat injections may be necessary if the response to treatment is slow. You can apply ice packs over the tender area for 30 minutes every 2 hours for the next few days. Anti-inflammatory and/or narcotic pain medicine may also be helpful. Limit your activity for the next few days if the pain continues. See your caregiver in 5-10 days if you are not greatly improved.  SEEK IMMEDIATE MEDICAL CARE IF:  You develop severe pain, fever, or increased redness.  You have pain that radiates below the knee. EXERCISES STRETCHING EXERCISES - Trochanteric Bursitis  These exercises may help you when beginning to rehabilitate your injury.  Your symptoms may resolve with or without further involvement from your physician, physical therapist, or athletic trainer. While completing these exercises, remember:   Restoring tissue flexibility helps normal motion to return to the joints. This allows healthier, less painful movement and activity.  An effective stretch should be held for at least 30 seconds.  A stretch should never be painful. You should only feel a gentle lengthening or release in the stretched tissue. STRETCH - Iliotibial Band  On the floor or bed, lie on your side so your injured leg is on top. Bend your knee and grab your ankle.  Slowly bring your knee back so that your thigh is in line with your trunk. Keep your heel at your buttocks and gently arch your back so your head, shoulders and hips line up.  Slowly lower your leg so that your knee approaches the floor/bed until you feel a gentle stretch on the outside of your thigh. If you do not feel a stretch and your knee will not fall farther, place the heel of your opposite foot on top of your knee and pull your thigh down farther.  Hold this stretch for __________ seconds.  Repeat __________ times. Complete this exercise __________ times per day. STRETCH - Hamstrings, Supine   Lie on your back. Loop a belt or towel over the ball of your foot as shown.  Straighten your knee and slowly pull on the belt to raise your injured leg. Do not allow the knee to bend. Keep your opposite leg flat on the floor.  Raise the leg until you feel a gentle stretch behind your knee or thigh. Hold this position for __________ seconds.  Repeat __________ times. Complete this stretch __________ times per day. STRETCH - Quadriceps, Prone   Lie on your stomach on a firm surface, such as a bed or padded floor.  Bend your knee and grasp your ankle. If you are unable to reach your ankle or pant leg, use a belt around your foot to lengthen your reach.  Gently pull your heel toward your  buttocks. Your knee should not slide out to the side. You should feel a stretch in the front of your thigh and/or knee.  Hold this position for __________ seconds.  Repeat __________ times. Complete this stretch __________ times per day. STRETCHING - Hip Flexors, Lunge Half kneel with your knee on the floor and your opposite knee bent and directly over your ankle.  Keep good posture with your head over your shoulders. Tighten your buttocks to point your tailbone downward; this will prevent your back from arching too much.  You should feel a gentle stretch in the front of your thigh and/or hip. If you do not feel any resistance, slightly slide your opposite foot forward and then slowly lunge forward so your knee once again lines up over your ankle. Be sure your tailbone remains pointed downward.  Hold this stretch for __________ seconds.  Repeat __________ times. Complete this stretch __________ times per day. STRETCH - Adductors, Lunge  While standing, spread your legs.  Lean away from your injured leg by bending your opposite knee. You may rest your hands on your thigh for balance.  You should feel a stretch in your inner thigh. Hold for __________ seconds.  Repeat __________ times. Complete this exercise __________ times per day. Document Released: 02/10/2004 Document Revised: 05/19/2013 Document Reviewed: 04/16/2008 [facility] Patient Information 2015 [place], [place]. This information is not intended to replace advice given to you by your health care provider. Make sure you discuss any questions you have with your health care provider.

## 2013-12-18 NOTE — Assessment & Plan Note (Signed)
Symptoms and exam consistent with trochanteric bursitis. Continue over-the-counter medications as needed for symptom relief. Stretches were shown to the patient's inpatient knowledge. He denies therapy as needed. Follow up if symptoms worsen or fail to improve.

## 2013-12-18 NOTE — Progress Notes (Signed)
Pre visit review using our clinic review tool, if applicable. No additional management support is needed unless otherwise documented below in the visit note. 

## 2013-12-18 NOTE — Progress Notes (Signed)
Subjective:    Patient ID: Alicia Guerrero, female    DOB: May 28, 1935, 78 y.o.   MRN: [id number]  Chief Complaint  Patient presents with  . Hip Pain    having right hip pain x3 days, hurts to walk, cross legs, going down leg, had a stroke on that side a year ago    HPI:  Alicia Guerrero is a 78 y.o. female who presents today for hip pain.   Acute right hip pain started about 3 days ago. She woke up in the middle of the night with some leg cramps. Has used bengay and then when she woke up it has been sore. Denies any trauma to the area. Denies any unaccustomed activity or lifting. Indicates she is unable to cross her legs and it feels like something is pulling. States it is feeling a little bit better today.  Allergies  Allergen Reactions  . [name] [Trandolapril]     cough  . Meperidine Hcl Swelling    Mixed with phenergan swelling of the mouth   . Penicillins Other (See Comments)    Pt has taken penicillin about 6 years ago with no side effects  . Molds & Smuts Other (See Comments)    Runny nose  . Tape Other (See Comments)    Redness, pulls skin off    Current Outpatient Prescriptions on File Prior to Visit  Medication Sig Dispense Refill  . acetaminophen (TYLENOL) 325 MG tablet Take 650 mg by mouth every 6 (six) hours as needed. For pain    . albuterol (PROVENTIL) (2.5 MG/3ML) 0.083% nebulizer solution Take 2.5 mg by nebulization every 6 (six) hours as needed.    Marland Kitchen apixaban (ELIQUIS) 5 MG TABS tablet TAKE ONE TABLET BY MOUTH TWICE DAILY 60 tablet 6  . atorvastatin (LIPITOR) 40 MG tablet Take 1 tablet (40 mg total) by mouth daily. 90 tablet 3  . carvedilol (COREG) 25 MG tablet One half twice daily    . cetirizine (ZYRTEC) 10 MG tablet Take 1 tablet (10 mg total) by mouth as needed for allergies. 90 tablet 3  . cholecalciferol (VITAMIN D) 1000 UNITS tablet Take 1 tablet (1,000 Units total) by mouth daily. 100 tablet 3  . diphenhydrAMINE (BENADRYL) 25 mg capsule Take 25 mg by  mouth at bedtime as needed. For sleep    . fluocinonide cream (LIDEX) 9.29 % Apply 1 application topically 2 (two) times daily as needed.    . furosemide (LASIX) 40 MG tablet Take 1 tablet (40 mg total) by mouth daily. 90 tablet 3  . nitroGLYCERIN (NITROSTAT) 0.4 MG SL tablet Place 1 tablet (0.4 mg total) under the tongue every 5 (five) minutes x 3 doses as needed for chest pain. Chest pain 20 tablet 3  . potassium chloride SA (K-DUR,KLOR-CON) 20 MEQ tablet Take 1 tablet (20 mEq total) by mouth daily. 90 tablet 3  . valsartan (DIOVAN) 80 MG tablet Take 1 tablet (80 mg total) by mouth daily. 30 tablet 11   No current facility-administered medications on file prior to visit.    Review of Systems    See HPI  Objective:    BP 172/80 mmHg  Pulse 62  Temp(Src) 98 F (36.7 C) (Oral)  Resp 18  Ht 5\' 3"  (1.6 m)  Wt 174 lb (78.926 kg)  BMI 30.83 kg/m2  SpO2 95% Nursing note and vital signs reviewed.  Physical Exam  Constitutional: She is oriented to person, place, and time. She appears well-developed and well-nourished. No distress.  Cardiovascular: Normal rate, regular rhythm, normal heart sounds and intact distal pulses.   Pulmonary/Chest: Effort normal and breath sounds normal.  Musculoskeletal:  No obvious deformity, discoloration, or edema. Tenderness elicited over the greater trochanteric bursa of right hip. Range of motion is full. Some soreness elicited with crossing her right leg over her left. Pulses and sensation are intact.  Neurological: She is alert and oriented to person, place, and time.  Skin: Skin is warm and dry.  Psychiatric: She has a normal mood and affect. Her behavior is normal. Judgment and thought content normal.       Assessment & Plan:

## 2013-12-25 ENCOUNTER — Encounter (HOSPITAL_COMMUNITY): Payer: Self-pay | Admitting: Cardiology

## 2014-01-28 ENCOUNTER — Ambulatory Visit (INDEPENDENT_AMBULATORY_CARE_PROVIDER_SITE_OTHER)
Admission: RE | Admit: 2014-01-28 | Discharge: 2014-01-28 | Disposition: A | Discharge status: Home / Self Care | Payer: Commercial Managed Care - HMO | Source: Ambulatory Visit | Attending: Internal Medicine | Admitting: Internal Medicine

## 2014-01-28 ENCOUNTER — Encounter: Payer: Self-pay | Admitting: Internal Medicine

## 2014-01-28 ENCOUNTER — Ambulatory Visit (INDEPENDENT_AMBULATORY_CARE_PROVIDER_SITE_OTHER): Payer: Commercial Managed Care - HMO | Admitting: Internal Medicine

## 2014-01-28 VITALS — BP 114/76 | HR 65 | Temp 98.0°F | Ht 63.0 in | Wt 171.4 lb

## 2014-01-28 DIAGNOSIS — I10 Essential (primary) hypertension: Secondary | ICD-10-CM | POA: Diagnosis not present

## 2014-01-28 DIAGNOSIS — IMO0001 Reserved for inherently not codable concepts without codable children: Secondary | ICD-10-CM

## 2014-01-28 DIAGNOSIS — J4521 Mild intermittent asthma with (acute) exacerbation: Secondary | ICD-10-CM | POA: Diagnosis not present

## 2014-01-28 DIAGNOSIS — R05 Cough: Secondary | ICD-10-CM | POA: Diagnosis not present

## 2014-01-28 MED ORDER — PREDNISONE 10 MG PO TABS: ORAL_TABLET | ORAL | Status: DC

## 2014-01-28 MED ORDER — NEBIVOLOL HCL 10 MG PO TABS: 10.0000 mg | ORAL_TABLET | Freq: Every day | ORAL | Status: DC

## 2014-01-28 MED ORDER — CEFDINIR 300 MG PO CAPS: 300.0000 mg | ORAL_CAPSULE | Freq: Two times a day (BID) | ORAL | Status: DC

## 2014-01-28 NOTE — Progress Notes (Signed)
Quick Note:  Spoke with pt and notified of results per Dr. Wert. Pt verbalized understanding and denied any questions.  ______ 

## 2014-01-28 NOTE — Patient Instructions (Addendum)
omnicef 300 mg twice daily x 10 days  Prednisone 10 mg take  4 each am x 2 days,   2 each am x 2 days,  1 each am x 2 days and stop   Ok to use the nebulizer with albuterol  up to every 4 hours if needed for wheeze, cough, short of breath  Coreg is likely making you wheeze so stop it  For now and start bystolic 10 mg daily   Please remember to go to the  x-ray department downstairs for your tests - we will call you with the results when they are available.  Please schedule a follow up office visit in 2 weeks, sooner if needed with Tammy to recheck your blood pressure

## 2014-01-28 NOTE — Assessment & Plan Note (Signed)
Strongly prefer in this setting: Bystolic, the most beta -1  selective Beta blocker available in sample form, with bisoprolol the most selective generic choice  on the market.   Try x 2 week sample of bystolic 10 mg daily

## 2014-01-28 NOTE — Progress Notes (Signed)
Subjective:    Patient ID: Alicia Guerrero, female    DOB: [birth year]   MRN: [id number]  Brief patient profile:  33 yowf never smoker with variable wheezing x decades ago attributed to cat exposure resolved to point where needed no inhaler at all still had some problems with sinus drainage year round and some breathing difficulty around dust or fumes referred to pulmonary clinic 05/16/13  by Alicia Pugh PA in [facility] for persistent cough/ sob since Oct 2013 while on ACEi   History of Present Illness  05/16/2013 1st [place] [facility] office visit/ Alicia Guerrero  Chief Complaint  Patient presents with  . Pulmonary Consult    Referred per Dr. Nicky Guerrero for eval of abnormal CT Chest. Pt c/o cough since Oct 2014- prod with clear, foamy sputum.  She c/o DOE with moderate exertion.   pt was on vent p sudden death in Fall 2013 seemed worse p trach came out and coughed every day since then - worse when talking on phone - drinking water makes it better, lying down makes it worse - uses albuterol every other day seems to help some rec Reduce your coreg to one half twice daily  Stop [facility] diovan 80 mg one daily     05/23/2013 f/u ov/Alicia Guerrero re: f/u cough/ hbp off acei  Chief Complaint  Patient presents with  . Follow-up    Pt states that her BP has been doing well. She also has had less cough since last visit. No new co's today.  Denies orthostatic complaints - Not limited by breathing from desired activities   rec Prednisone 10 mg take  4 each am x 2 days,   2 each am x 2 days,  1 each am x 2 days and stop  Stop aldactone for now    08/15/2013 f/u ov/Alicia Guerrero re: cough resolved off acei Chief Complaint  Patient presents with  . Follow-up    Pt states that her cough is much improved. Breathing is doing well.  No new co's today.    some nasal drip/ pnds controlled on zyrtec  Not limited by breathing from desired activities   No swelling of ext or orthopnea off aldactone No need for saba in any form  now rec No change rx   01/28/2014 acute ov/Alicia Guerrero re: recurrent cough off acei but on coreg  Chief Complaint  Patient presents with  . Acute Visit    Pt c/o increased cough, wheezing and "rattling in chest" for the past 2 wks. Her cough is prod with yellow to clear sputum. She is using her nebulizer every night.   was doing great until acute onset of head cold she thinks she got  from husband nasal > chest congestion Albuterol helps some  Before the illness no need for albuterol just zyrtec and prn benadryl    No obvious day to day or daytime variabilty or assoc sob  cp or chest tightness, subjective wheeze overt sinus or hb symptoms. No unusual exp hx or h/o childhood pna/ asthma or knowledge of premature birth.  Sleeping ok without nocturnal  or early am exacerbation  of respiratory  c/o's or need for noct saba. Also denies any obvious fluctuation of symptoms with weather or environmental changes or other aggravating or alleviating factors except as outlined above   Current Medications, Allergies, Complete Past Medical History, Past Surgical History, Family History, and Social History were reviewed in [employer] record.  ROS  The following are not  active complaints unless bolded sore throat, dysphagia, dental problems, itching, sneezing,  nasal congestion or excess/ purulent secretions, ear ache,   fever, chills, sweats, unintended wt loss, pleuritic or exertional cp, hemoptysis,  orthopnea pnd or leg swelling, presyncope, palpitations, heartburn, abdominal pain, anorexia, nausea, vomiting, diarrhea  or change in bowel or urinary habits, change in stools or urine, dysuria,hematuria,  rash, arthralgias, visual complaints, headache, numbness weakness or ataxia or problems with walking or coordination,  change in mood/affect or memory.         Objective:   Physical Exam  05/23/2013         159 > 08/15/2013   166 > 01/28/2014 171  Wt Readings from Last 3 Encounters:   05/16/13 156 lb 6.4 oz (70.943 kg)  05/06/13 159 lb 12 oz (72.462 kg)  04/25/13 160 lb (72.576 kg)      HEENT: nl dentition, turbinates, and orophanx. Nl external ear canals without cough reflex   NECK :  without JVD/Nodes/TM/ nl carotid upstrokes bilaterally   LUNGS: no acc muscle use  Min late  exp rhonchi   bilaterally without cough on insp or exp maneuvers   CV:  RRR  no s3 or murmur or increase in P2, no edema   ABD:  soft and nontender with nl excursion in the supine position. No bruits or organomegaly, bowel sounds nl  MS:  warm without deformities, calf tenderness, cyanosis or clubbing              CXR PA and Lateral:   01/28/2014 :     I personally reviewed images and agree with radiology impression as follows:    No acute chest process. Stable chest exam as above.      Assessment & Plan:

## 2014-02-02 ENCOUNTER — Encounter: Payer: Self-pay | Admitting: Internal Medicine

## 2014-02-02 NOTE — Assessment & Plan Note (Signed)
Acute  Moderately sever  flare in setting of uri with increasing need for saba while on relatively non specific BB which tolerates well chronically s need for saba at all   DDX of  difficult airways management all start with A and  include Adherence, Ace Inhibitors, Acid Reflux, Active Sinus Disease, Alpha 1 Antitripsin deficiency, Anxiety masquerading as Airways dz,  ABPA,  allergy(esp in young), Aspiration (esp in elderly), Adverse effects of DPI,  Active smokers, plus two Bs  = Bronchiectasis and Beta blocker use..and one C= CHF  Adherence is always the initial "prime suspect" and is a multilayered concern that requires a "trust but verify" approach in every patient - starting with knowing how to use medications, especially inhalers, correctly, keeping up with refills and understanding the fundamental difference between maintenance and prns vs those medications only taken for a very short course and then stopped and not refilled.  ? Active sinus dz ?> omnicef x 10 days then sinus ct if not clear  ? Allergy/ Prednisone 10 mg take  4 each am x 2 days,   2 each am x 2 days,  1 each am x 2 days and stop  ? Effects of BB > see hbp  See instructions for specific recommendations which were reviewed directly with the patient who was given a copy with highlighter outlining the key components.

## 2014-02-11 ENCOUNTER — Ambulatory Visit: Payer: Commercial Managed Care - HMO | Admitting: Internal Medicine

## 2014-02-11 ENCOUNTER — Encounter: Payer: Self-pay | Admitting: Adult Health

## 2014-02-11 ENCOUNTER — Ambulatory Visit (INDEPENDENT_AMBULATORY_CARE_PROVIDER_SITE_OTHER): Payer: Commercial Managed Care - HMO | Admitting: Adult Health

## 2014-02-11 VITALS — BP 126/74 | HR 70 | Temp 98.2°F | Ht 63.0 in | Wt 172.6 lb

## 2014-02-11 DIAGNOSIS — J4521 Mild intermittent asthma with (acute) exacerbation: Secondary | ICD-10-CM | POA: Diagnosis not present

## 2014-02-11 DIAGNOSIS — IMO0001 Reserved for inherently not codable concepts without codable children: Secondary | ICD-10-CM

## 2014-02-11 MED ORDER — LEVALBUTEROL HCL 0.63 MG/3ML IN NEBU
0.6300 mg | INHALATION_SOLUTION | Freq: Once | RESPIRATORY_TRACT | Status: AC
  Administered 2014-02-11: 0.63 mg via RESPIRATORY_TRACT

## 2014-02-11 MED ORDER — BISOPROLOL FUMARATE 10 MG PO TABS: 10.0000 mg | ORAL_TABLET | Freq: Every day | ORAL | Status: DC

## 2014-02-11 MED ORDER — VALSARTAN 80 MG PO TABS: 80.0000 mg | ORAL_TABLET | Freq: Every day | ORAL | Status: DC

## 2014-02-11 MED ORDER — ALBUTEROL SULFATE (2.5 MG/3ML) 0.083% IN NEBU: 2.5000 mg | INHALATION_SOLUTION | Freq: Four times a day (QID) | RESPIRATORY_TRACT | Status: DC | PRN

## 2014-02-11 NOTE — Patient Instructions (Addendum)
Add Mucinex DM Twice daily  As needed  Cough/congestion .  May use Bisoprolol 10mg  daily in place of Bystolic .  Follow up Dr. Melvyn Novas in 4-6 weeks with PFT  Please contact office for sooner follow up if symptoms do not improve or worsen or seek emergency care

## 2014-02-11 NOTE — Progress Notes (Signed)
Subjective:    Patient ID: Alicia Guerrero, female    DOB: August 25, 1935   MRN: [id number]  Brief patient profile:  71 yowf never smoker with variable wheezing x decades ago attributed to cat exposure resolved to point where needed no inhaler at all still had some problems with sinus drainage year round and some breathing difficulty around dust or fumes referred to pulmonary clinic 05/16/13  by Alicia Pugh PA in [facility] for persistent cough/ sob since Oct 2013 while on ACEi   History of Present Illness  05/16/2013 1st [place] [facility] office visit/ Wert  Chief Complaint  Patient presents with  . Pulmonary Consult    Referred per Dr. Nicky Guerrero for eval of abnormal CT Chest. Pt c/o cough since Oct 2014- prod with clear, foamy sputum.  She c/o DOE with moderate exertion.   pt was on vent p sudden death in Fall 2013 seemed worse p trach came out and coughed every day since then - worse when talking on phone - drinking water makes it better, lying down makes it worse - uses albuterol every other day seems to help some rec Reduce your coreg to one half twice daily  Stop [facility] diovan 80 mg one daily     05/23/2013 f/u ov/Wert re: f/u cough/ hbp off acei  Chief Complaint  Patient presents with  . Follow-up    Pt states that her BP has been doing well. She also has had less cough since last visit. No new co's today.  Denies orthostatic complaints - Not limited by breathing from desired activities   rec Prednisone 10 mg take  4 each am x 2 days,   2 each am x 2 days,  1 each am x 2 days and stop  Stop aldactone for now    08/15/2013 f/u ov/Wert re: cough resolved off acei Chief Complaint  Patient presents with  . Follow-up    Pt states that her cough is much improved. Breathing is doing well.  No new co's today.    some nasal drip/ pnds controlled on zyrtec  Not limited by breathing from desired activities   No swelling of ext or orthopnea off aldactone No need for saba in any form  now rec No change rx   01/28/2014 acute ov/Wert re: recurrent cough off acei but on coreg  Chief Complaint  Patient presents with  . Acute Visit    Pt c/o increased cough, wheezing and "rattling in chest" for the past 2 wks. Her cough is prod with yellow to clear sputum. She is using her nebulizer every night.   was doing great until acute onset of head cold she thinks she got  from husband nasal > chest congestion Albuterol helps some  Before the illness no need for albuterol just zyrtec and prn benadryl  >omnicef x 10 and steroids taper   02/11/14 Follow up and Med review  Patient returns for a two-week follow-up and medication review next reviewed all her medications organize them into a medication count with patient education. Last visit . Patient with acute bronchitis /sinusitis flare. Treated with Omnicef and a steroid taper Says that she is feeling better. Still has rattle in upper chest. prod cough w/clear mucus. denies f/n/v/d Denies any hemoptysis, orthopnea, PND or leg swelling   Current Medications, Allergies, Complete Past Medical History, Past Surgical History, Family History, and Social History were reviewed in [employer] record.  ROS  The following are not active  complaints unless bolded sore throat, dysphagia, dental problems, itching, sneezing,  nasal congestion or excess/ purulent secretions, ear ache,   fever, chills, sweats, unintended wt loss, pleuritic or exertional cp, hemoptysis,  orthopnea pnd or leg swelling, presyncope, palpitations, heartburn, abdominal pain, anorexia, nausea, vomiting, diarrhea  or change in bowel or urinary habits, change in stools or urine, dysuria,hematuria,  rash, arthralgias, visual complaints, headache, numbness weakness or ataxia or problems with walking or coordination,  change in mood/affect or memory.         Objective:   Physical Exam  05/23/2013         159 > 08/15/2013   166 > 01/28/2014 171 >172 02/11/14       HEENT: nl dentition, turbinates, and orophanx. Nl external ear canals without cough reflex   NECK :  without JVD/Nodes/TM/ nl carotid upstrokes bilaterally   LUNGS: no acc muscle use , no wheezing  bilaterally without cough on insp or exp maneuvers   CV:  RRR  no s3 or murmur or increase in P2, no edema   ABD:  soft and nontender with nl excursion in the supine position. No bruits or organomegaly, bowel sounds nl  MS:  warm without deformities, calf tenderness, cyanosis or clubbing              CXR PA and Lateral:   01/28/2014 :     I personally reviewed images and agree with radiology impression as follows:    No acute chest process. Stable chest exam as above.      Assessment & Plan:

## 2014-02-19 ENCOUNTER — Ambulatory Visit (INDEPENDENT_AMBULATORY_CARE_PROVIDER_SITE_OTHER): Payer: Commercial Managed Care - HMO | Admitting: Internal Medicine

## 2014-02-19 ENCOUNTER — Encounter: Payer: Self-pay | Admitting: Internal Medicine

## 2014-02-19 VITALS — BP 126/80 | HR 76 | Ht 63.0 in | Wt 174.8 lb

## 2014-02-19 DIAGNOSIS — I4901 Ventricular fibrillation: Secondary | ICD-10-CM

## 2014-02-19 DIAGNOSIS — I447 Left bundle-branch block, unspecified: Secondary | ICD-10-CM | POA: Diagnosis not present

## 2014-02-19 DIAGNOSIS — I255 Ischemic cardiomyopathy: Secondary | ICD-10-CM | POA: Diagnosis not present

## 2014-02-19 DIAGNOSIS — I5022 Chronic systolic (congestive) heart failure: Secondary | ICD-10-CM

## 2014-02-19 DIAGNOSIS — R001 Bradycardia, unspecified: Secondary | ICD-10-CM | POA: Diagnosis not present

## 2014-02-19 DIAGNOSIS — Z4502 Encounter for adjustment and management of automatic implantable cardiac defibrillator: Secondary | ICD-10-CM

## 2014-02-19 LAB — MDC_IDC_ENUM_SESS_TYPE_INCLINIC
Brady Statistic RV Percent Paced: 99 %
Date Time Interrogation Session: 20160204144629
HIGH POWER IMPEDANCE MEASURED VALUE: 68.625
Implantable Pulse Generator Model: 3265
Implantable Pulse Generator Serial Number: 7063327
Lead Channel Impedance Value: 587.5 Ohm
Lead Channel Pacing Threshold Amplitude: 0.875 V
Lead Channel Pacing Threshold Pulse Width: 1 ms
Lead Channel Sensing Intrinsic Amplitude: 3.5 mV
Lead Channel Setting Pacing Amplitude: 2 V
Lead Channel Setting Pacing Amplitude: 4 V
Lead Channel Setting Pacing Pulse Width: 0.5 ms
Lead Channel Setting Sensing Sensitivity: 0.5 mV
MDC IDC MSMT BATTERY REMAINING LONGEVITY: 22.8 mo
MDC IDC MSMT LEADCHNL LV PACING THRESHOLD AMPLITUDE: 2.75 V
MDC IDC MSMT LEADCHNL RA IMPEDANCE VALUE: 375 Ohm
MDC IDC MSMT LEADCHNL RA PACING THRESHOLD AMPLITUDE: 1.125 V
MDC IDC MSMT LEADCHNL RA PACING THRESHOLD PULSEWIDTH: 0.5 ms
MDC IDC MSMT LEADCHNL RV IMPEDANCE VALUE: 362.5 Ohm
MDC IDC MSMT LEADCHNL RV PACING THRESHOLD PULSEWIDTH: 0.5 ms
MDC IDC MSMT LEADCHNL RV SENSING INTR AMPL: 11.3 mV
MDC IDC SET LEADCHNL LV PACING PULSEWIDTH: 1.5 ms
MDC IDC SET LEADCHNL RV PACING AMPLITUDE: 2 V
MDC IDC SET ZONE DETECTION INTERVAL: 300 ms
MDC IDC STAT BRADY RA PERCENT PACED: 87 %
Zone Setting Detection Interval: 340 ms

## 2014-02-19 MED ORDER — BISOPROLOL FUMARATE 5 MG PO TABS: 5.0000 mg | ORAL_TABLET | Freq: Every day | ORAL | Status: DC

## 2014-02-19 NOTE — Patient Instructions (Addendum)
Your physician has recommended you make the following change in your medication:  1) DECREASE Bisoprolol to 5 mg daily 2) Take Lasix 40 mg twice daily for three days, then return to normal dosing of 40 mg daily  Remote monitoring is used to monitor your Pacemaker of ICD from home. This monitoring reduces the number of office visits required to check your device to one time per year. It allows [place] to keep an eye on the functioning of your device to ensure it is working properly. You are scheduled for a device check from home on 05/21/14. You may send your transmission at any time that day. If you have a wireless device, the transmission will be sent automatically. After your physician reviews your transmission, you will receive a postcard with your next transmission date.  Your physician wants you to follow-up in: 1 year with Dr. Caryl Comes.  You will receive a reminder letter in the mail two months in advance. If you don't receive a letter, please call our office to schedule the follow-up appointment.   Resources: [facility]  [place]   [phone]  [facility]  - [phone] [address] [place], [place] [address]

## 2014-02-19 NOTE — Progress Notes (Signed)
Patient Care Team: [name], MD as PCP - General (Internal Medicine) Collene Schlichter, [place] as Consulting Physician (Gastroenterology) [name], MD as Consulting Physician (Cardiology) Tanda Rockers, MD as Consulting Physician (Pulmonary Disease)   HPI  Alicia Guerrero is a 79 y.o. female Seen in followup for aborted cardiac arrest in the context of coronary artery disease and left bundle branch block. This occurred in the context of coronary artery disease. LHC at that time >>Stents patent in RCA/PDA, LAD, and D1. There is a 95% ostial stenosis of a moderate to large OM1. Looking at the prior cath, it is difficult to tell whether or not this was present before. As the patient's arrest was over a week ago, at this time I think that medical management is the best course. Discussed with Dr. Martinique who rounded on the patient this morning.  Because of persistent left ventricular dysfunction and no clear trigger for the event he successfully underwent CRT-D implantation. 10/13 by Dr. Amaryllis Dyke   She was hospitalized 11/14 for strokes x2 that occurred in the context of a supratherapeutic INR. It was elected to switch her to apixaban. She was discharged also on aspirin.  She is gradually recovering. She has some fatigue.  She has some nocturnal dyspnea. She has some dyspnea on exertion. She has been caring for her husband.      Past Medical History  Diagnosis Date  . Left bundle branch block   . Atrial fibrillation   . CAD (coronary artery disease)     a. s/p stent to RCA 1996;  b. s/p cutting balloon PTCA to LAD 2001; c. s/p [place] DES to LAD 2003;  d. NSTEMI 4/12: DES to dRCA;  e.  [place] 9/13 (after presentation with cardiac arrest and + Nz's for NSTEMI): pLAD 30% ISR, oD1 30%, dLAD 60-70%, pOM1 (small vessel) 90%, oOM2 95% (moderate to large vessel), mCFX 30%, pOM3 30-40%, dRCA stents and pPDA stents patent with 30% ISR => med Rx rec.   . Ischemic cardiomyopathy       a. EF 40% at cath 4/12 with AL and Inf HK;  b.  Echo after presentation with cardiac arrest 9/13:  EF 40-45% and severe inf HK to AK c/w infarction, PASP 42  . HLD (hyperlipidemia)   . HTN (hypertension)   . Other atopic dermatitis and related conditions   . Allergic rhinitis due to pollen   . Asthma   . Other and unspecified ovarian cyst   . Personal history of malignant neoplasm of breast   . Personal history of hyperthyroidism   . Chronic eczema     hands  . Hemorrhoids   . Cancer   . Bradycardia     a. coreg tapered off 05/2011 => b. Metoprolol restarted after cardiac arrest 09/2011  . Cardiac arrest 09/2011    a. VT/VF in community; resusc unsuccessful;  PEA in ED; multiple defibs => revived;  [employer];  c/b by VDRF, cardiogenic shock;  LHC with stable anatomy => med Rx;  difficult ween from vent => s/p trach;  d/c to SNF  . Chronic systolic heart failure   . Biventricular ICD (implantable cardiac defibrillator) in place     St Jude 10/2011 (implant MD: Allred)    Past Surgical History  Procedure Laterality Date  . Oophorectomy    . Abdominal hysterectomy    . Ptca    . Cholecystectomy    . Coronary stent placement  may and  july 2012  . Breast lumpectomy  08/24/2003    right lumpectomy+sln,T2N0,ERPR+,Her2-  . Polypectomy    . Colonoscopy    . Video bronchoscopy  10/26/2011    Procedure: VIDEO BRONCHOSCOPY WITHOUT FLUORO;  Surgeon: Raylene Miyamoto, MD;  Location: [facility] ENDOSCOPY;  Service: Endoscopy;  Laterality: Bilateral;  . Left heart catheterization with coronary angiogram N/A 10/09/2011    Procedure: LEFT HEART CATHETERIZATION WITH CORONARY ANGIOGRAM;  Surgeon: Larey Dresser, MD;  Location: [facility] CATH LAB;  Service: Cardiovascular;  Laterality: N/A;  . Bi-ventricular implantable cardioverter defibrillator N/A 11/02/2011    Procedure: BI-VENTRICULAR IMPLANTABLE CARDIOVERTER DEFIBRILLATOR  (CRT-D);  Surgeon: Thompson Grayer, MD;  Location: [facility] CATH LAB;  Service:  Cardiovascular;  Laterality: N/A;    Current Outpatient Prescriptions  Medication Sig Dispense Refill  . acetaminophen (TYLENOL) 325 MG tablet Take 650 mg by mouth every 6 (six) hours as needed. For pain    . albuterol (PROVENTIL) (2.5 MG/3ML) 0.083% nebulizer solution Take 3 mLs (2.5 mg total) by nebulization every 6 (six) hours as needed. 75 mL 5  . apixaban (ELIQUIS) 5 MG TABS tablet TAKE ONE TABLET BY MOUTH TWICE DAILY 60 tablet 6  . atorvastatin (LIPITOR) 40 MG tablet Take 1 tablet (40 mg total) by mouth daily. 90 tablet 3  . bisoprolol (ZEBETA) 10 MG tablet Take 1 tablet (10 mg total) by mouth daily. 90 tablet 3  . cetirizine (ZYRTEC) 10 MG tablet Take 1 tablet (10 mg total) by mouth as needed for allergies. 90 tablet 3  . fluocinonide cream (LIDEX) 1.76 % Apply 1 application topically 2 (two) times daily as needed (skin).     . furosemide (LASIX) 40 MG tablet Take 1 tablet (40 mg total) by mouth daily. 90 tablet 3  . nitroGLYCERIN (NITROSTAT) 0.4 MG SL tablet Place 1 tablet (0.4 mg total) under the tongue every 5 (five) minutes x 3 doses as needed for chest pain. Chest pain 20 tablet 3  . potassium chloride SA (K-DUR,KLOR-CON) 20 MEQ tablet Take 1 tablet (20 mEq total) by mouth daily. 90 tablet 3  . valsartan (DIOVAN) 80 MG tablet Take 1 tablet (80 mg total) by mouth daily. 30 tablet 11  . cholecalciferol (VITAMIN D) 1000 UNITS tablet Take 1 tablet (1,000 Units total) by mouth daily. (Patient not taking: Reported on 02/19/2014) 100 tablet 3   No current facility-administered medications for this visit.    Allergies  Allergen Reactions  . Coreg [Carvedilol]     Severe wheezing   . [name] [Trandolapril]     cough  . Meperidine Hcl Swelling    Mixed with phenergan swelling of the mouth   . Penicillins Other (See Comments)    Pt has taken penicillin about 6 years ago with no side effects  . Molds & Smuts Other (See Comments)    Runny nose  . Tape Other (See Comments)    Redness,  pulls skin off    Review of Systems negative except from HPI and PMH  Physical Exam BP 126/80 mmHg  Pulse 76  Ht 5' 3"  (1.6 m)  Wt 174 lb 12.8 oz (79.289 kg)  BMI 30.97 kg/m2 Well developed and nourished in no acute distress HENT normal Neck supple with JVP-6 Clear Device pocket well healed; without hematoma or erythema.  There is no tethering Regular rate and rhythm, no murmurs or gallops Abd-soft with active BS No Clubbing cyanosis edema Skin-warm and dry A & Oriented  Grossly normal sensory and motor  function   ECG demonstrates AV pacing with a right bundle branch block paced configuration  Assessment and  Plan  Atrial fibrillation No intercurrent atrial fibrillation or flutter    Heart Failure-chronic systolic  Ventricular Tachycardia/fibrillation No intercurrent Ventricular tachycardia  Sinus Bradycardia  Biventricular ICD St Jude The patient's device was interrogated.  The information was reviewed. Output reprogrammed to 1-coil for improved threshol  Her dyspnea is almost certainly a combination of heart failure as well as COPD. I will take the liberty with her blood pressure being quite good to decrease her bisoprolol from 10--5; we will also give her a few extra days Lasix at 40 mg twice daily.

## 2014-02-24 ENCOUNTER — Encounter: Payer: Self-pay | Admitting: Internal Medicine

## 2014-02-26 NOTE — Assessment & Plan Note (Signed)
Recent flare with upper airway cough   Patient's medications were reviewed today and patient education was given. Computerized medication calendar was adjusted/completed   Plan  Add Mucinex DM Twice daily  As needed  Cough/congestion .  May use Bisoprolol 10mg  daily in place of Bystolic .  Follow up Dr. Melvyn Novas in 4-6 weeks with PFT  Please contact office for sooner follow up if symptoms do not improve or worsen or seek emergency care

## 2014-03-13 ENCOUNTER — Other Ambulatory Visit: Payer: Self-pay | Admitting: *Deleted

## 2014-03-13 DIAGNOSIS — I5022 Chronic systolic (congestive) heart failure: Secondary | ICD-10-CM

## 2014-03-13 MED ORDER — FUROSEMIDE 40 MG PO TABS: 40.0000 mg | ORAL_TABLET | Freq: Every day | ORAL | Status: DC

## 2014-03-13 MED ORDER — ATORVASTATIN CALCIUM 40 MG PO TABS
40.0000 mg | ORAL_TABLET | Freq: Every day | ORAL | Status: DC
Start: 2014-03-13 — End: 2014-05-15

## 2014-03-19 ENCOUNTER — Telehealth: Payer: Self-pay | Admitting: Internal Medicine

## 2014-03-19 DIAGNOSIS — H547 Unspecified visual loss: Secondary | ICD-10-CM

## 2014-03-19 NOTE — Telephone Encounter (Signed)
Pt request refill for atorvastatin (LIPITOR) 40 MG to be send to [facility], pt stated [facility] told her that they do not have to authorization for this med. Please help

## 2014-03-20 NOTE — Telephone Encounter (Signed)
Ok both Thx 

## 2014-03-20 NOTE — Telephone Encounter (Signed)
[name] was sent to [facility] mail order pharmacy on 03/13/14. See meds. Pt informed. Alicia Guerrero is needing a referral to Dr. Arnoldo Morale at [facility] for routine eye exam. Please advise

## 2014-03-25 ENCOUNTER — Encounter: Payer: Commercial Managed Care - HMO | Admitting: Internal Medicine

## 2014-03-25 ENCOUNTER — Ambulatory Visit: Payer: Medicare HMO | Admitting: Internal Medicine

## 2014-04-22 ENCOUNTER — Other Ambulatory Visit: Payer: Self-pay | Admitting: Internal Medicine

## 2014-04-23 ENCOUNTER — Other Ambulatory Visit: Payer: Self-pay | Admitting: Internal Medicine

## 2014-04-23 DIAGNOSIS — R0600 Dyspnea, unspecified: Secondary | ICD-10-CM

## 2014-04-24 ENCOUNTER — Encounter: Payer: Self-pay | Admitting: Internal Medicine

## 2014-04-24 ENCOUNTER — Ambulatory Visit (INDEPENDENT_AMBULATORY_CARE_PROVIDER_SITE_OTHER): Payer: Commercial Managed Care - HMO | Admitting: Internal Medicine

## 2014-04-24 VITALS — BP 134/80 | HR 87 | Ht 63.25 in | Wt 176.0 lb

## 2014-04-24 DIAGNOSIS — I10 Essential (primary) hypertension: Secondary | ICD-10-CM

## 2014-04-24 DIAGNOSIS — R0600 Dyspnea, unspecified: Secondary | ICD-10-CM

## 2014-04-24 DIAGNOSIS — J4520 Mild intermittent asthma, uncomplicated: Secondary | ICD-10-CM

## 2014-04-24 LAB — PULMONARY FUNCTION TEST
DL/VA % pred: 107 %
DL/VA: 5.05 ml/min/mmHg/L
DLCO UNC % PRED: 77 %
DLCO UNC: 18.08 ml/min/mmHg
FEF 25-75 POST: 1.48 L/s
FEF 25-75 PRE: 1.26 L/s
FEF2575-%Change-Post: 17 %
FEF2575-%PRED-POST: 96 %
FEF2575-%Pred-Pre: 82 %
FEV1-%CHANGE-POST: 7 %
FEV1-%PRED-PRE: 68 %
FEV1-%Pred-Post: 73 %
FEV1-PRE: 1.35 L
FEV1-Post: 1.45 L
FEV1FVC-%Change-Post: -5 %
FEV1FVC-%PRED-PRE: 105 %
FEV6-%CHANGE-POST: 14 %
FEV6-%Pred-Post: 76 %
FEV6-%Pred-Pre: 67 %
FEV6-POST: 1.94 L
FEV6-Pre: 1.69 L
FEV6FVC-%Change-Post: 0 %
FEV6FVC-%PRED-POST: 105 %
FEV6FVC-%PRED-PRE: 105 %
FVC-%CHANGE-POST: 13 %
FVC-%PRED-POST: 72 %
FVC-%PRED-PRE: 64 %
FVC-POST: 1.94 L
FVC-Pre: 1.71 L
POST FEV1/FVC RATIO: 75 %
POST FEV6/FVC RATIO: 100 %
PRE FEV1/FVC RATIO: 79 %
Pre FEV6/FVC Ratio: 100 %
RV % PRED: 103 %
RV: 2.37 L
TLC % pred: 90 %
TLC: 4.45 L

## 2014-04-24 NOTE — Patient Instructions (Signed)
Your lung function is normal, no need for pulmonary follow up unless you start needing your albuterol again more than a few times a week

## 2014-04-24 NOTE — Progress Notes (Signed)
Subjective:    Patient ID: Alicia Guerrero, female    DOB: 1935-09-28   MRN: [id number]  Brief patient profile:  12 yowf never smoker with variable wheezing x decades   attributed to cat exposure resolved to point where needed no inhaler at all still had some problems with sinus drainage year round and some breathing difficulty around dust or fumes referred to pulmonary clinic 05/16/13  by Nicky Pugh PA in [facility] for persistent cough/ sob since Oct 2013 while on ACEi with nl pfts 04/24/2014 off acei and on bisoprolol instead of coreg    History of Present Illness  05/16/2013 1st [place] [facility] office visit/ Tariya Morrissette  Chief Complaint  Patient presents with  . Pulmonary Consult    Referred per Dr. Nicky Pugh for eval of abnormal CT Chest. Pt c/o cough since Oct 2014- prod with clear, foamy sputum.  She c/o DOE with moderate exertion.   pt was on vent p sudden death in Fall 2013 seemed worse p trach came out and coughed every day since then - worse when talking on phone - drinking water makes it better, lying down makes it worse - uses albuterol every other day seems to help some rec Reduce your coreg to one half twice daily  Stop [facility] diovan 80 mg one daily     05/23/2013 f/u ov/Dashanique Brownstein re: f/u cough/ hbp off acei  Chief Complaint  Patient presents with  . Follow-up    Pt states that her BP has been doing well. She also has had less cough since last visit. No new co's today.  Denies orthostatic complaints - Not limited by breathing from desired activities   rec Prednisone 10 mg take  4 each am x 2 days,   2 each am x 2 days,  1 each am x 2 days and stop  Stop aldactone for now    08/15/2013 f/u ov/Kyerra Vargo re: cough resolved off acei Chief Complaint  Patient presents with  . Follow-up    Pt states that her cough is much improved. Breathing is doing well.  No new co's today.    some nasal drip/ pnds controlled on zyrtec  Not limited by breathing from desired activities   No  swelling of ext or orthopnea off aldactone No need for saba in any form now rec No change rx   01/28/2014 acute ov/Yoshua Geisinger re: recurrent cough off acei but on coreg  Chief Complaint  Patient presents with  . Acute Visit    Pt c/o increased cough, wheezing and "rattling in chest" for the past 2 wks. Her cough is prod with yellow to clear sputum. She is using her nebulizer every night.   was doing great until acute onset of head cold she thinks she got  from husband nasal > chest congestion Albuterol helps some  Before the illness no need for albuterol just zyrtec and prn benadryl  >omnicef x 10 and steroids taper   02/11/14 Follow up and Med review  Patient returns for a two-week follow-up and medication review next reviewed all her medications organize them into a medication count with patient education. Last visit . Patient with acute bronchitis /sinusitis flare. Treated with Omnicef and a steroid taper Says that she is feeling better. Still has rattle in upper chest. prod cough w/clear mucus  rec Add Mucinex DM Twice daily  As needed  Cough/congestion .  May use Bisoprolol 10mg  daily in place of Bystolic .     04/24/2014  f/u ov/Silvestre Mines re: cough/ sob resolved off acei/bisoprolol with no maint rx for airways  Chief Complaint  Patient presents with  . Follow-up    PFT Results; no issues with breathing today.  Not limited by breathing from desired activities  > R Hip limiting  No neb weeks    No obvious day to day or daytime variabilty or assoc chronic cough or cp or chest tightness, subjective wheeze overt sinus or hb symptoms. No unusual exp hx or h/o childhood pna/ asthma or knowledge of premature birth.  Sleeping ok without nocturnal  or early am exacerbation  of respiratory  c/o's or need for noct saba. Also denies any obvious fluctuation of symptoms with weather or environmental changes or other aggravating or alleviating factors except as outlined above   Current Medications,  Allergies, Complete Past Medical History, Past Surgical History, Family History, and Social History were reviewed in [employer] record.  ROS  The following are not active complaints unless bolded sore throat, dysphagia, dental problems, itching, sneezing,  nasal congestion or excess/ purulent secretions, ear ache,   fever, chills, sweats, unintended wt loss, pleuritic or exertional cp, hemoptysis,  orthopnea pnd or leg swelling, presyncope, palpitations, heartburn, abdominal pain, anorexia, nausea, vomiting, diarrhea  or change in bowel or urinary habits, change in stools or urine, dysuria,hematuria,  rash, arthralgias, visual complaints, headache, numbness weakness or ataxia or problems with walking or coordination,  change in mood/affect or memory.          Objective:   Physical Exam  05/23/2013     wt    159 > 08/15/2013   166  Wt Readings from Last 3 Encounters:  04/24/14 176 lb (79.833 kg)  02/19/14 174 lb 12.8 oz (79.289 kg)  02/11/14 172 lb 9.6 oz (78.291 kg)    Vital signs reviewed      HEENT: nl dentition, turbinates, and orophanx. Nl external ear canals without cough reflex   NECK :  without JVD/Nodes/TM/ nl carotid upstrokes bilaterally   LUNGS: no acc muscle use , no wheezing  bilaterally without cough on insp or exp maneuvers   CV:  RRR  no s3 or murmur or increase in P2, no edema   ABD:  soft and nontender with nl excursion in the supine position. No bruits or organomegaly, bowel sounds nl  MS:  warm without deformities, calf tenderness, cyanosis or clubbing              CXR PA and Lateral:   01/28/2014 :     I personally reviewed images and agree with radiology impression as follows:    No acute chest process. Stable chest exam as above.      Assessment & Plan:

## 2014-04-24 NOTE — Progress Notes (Signed)
PFT done today. 

## 2014-04-26 ENCOUNTER — Encounter: Payer: Self-pay | Admitting: Internal Medicine

## 2014-04-26 NOTE — Assessment & Plan Note (Signed)
Try off coreg due to moderate intermittent asthma 01/28/2014 > all symptoms resolved   Doing well off acei and coreg with good bp control on diovan and bisoprolol  F/u here is prn

## 2014-04-26 NOTE — Assessment & Plan Note (Addendum)
-   trial off coreg and on bystolic 2/68/34 > all symptoms resolved 04/24/14 off all resp rx  - PFTs 04/24/14 > wnl off al lrx   I had an extended summary /final  discussion with the patient reviewing all relevant studies completed to date and  lasting 15 to 20 minutes of a 25 minute visit on the following ongoing concerns:  1) there is no residual airflow obst at all off all rx  2) she may be prone to acute asthma but has saba for this and no chronic symptoms - rule of two's reviewed  3)  Each maintenance medication was reviewed in detail including most importantly the difference between maintenance and as needed and under what circumstances the prns are to be used.  Please see instructions for details which were reviewed in writing and the patient given a copy.

## 2014-05-01 DIAGNOSIS — H538 Other visual disturbances: Secondary | ICD-10-CM | POA: Diagnosis not present

## 2014-05-01 DIAGNOSIS — H26493 Other secondary cataract, bilateral: Secondary | ICD-10-CM | POA: Diagnosis not present

## 2014-05-13 IMAGING — CR DG CHEST 1V PORT
1 series · 1 of 1 positions shown · non-contrast
Comparison: Chest x-ray 09/27/2011.

CLINICAL DATA: Intubated patient.  Evaluate for endotracheal tube
placement.

PORTABLE CHEST - 1 VIEW

[AP]
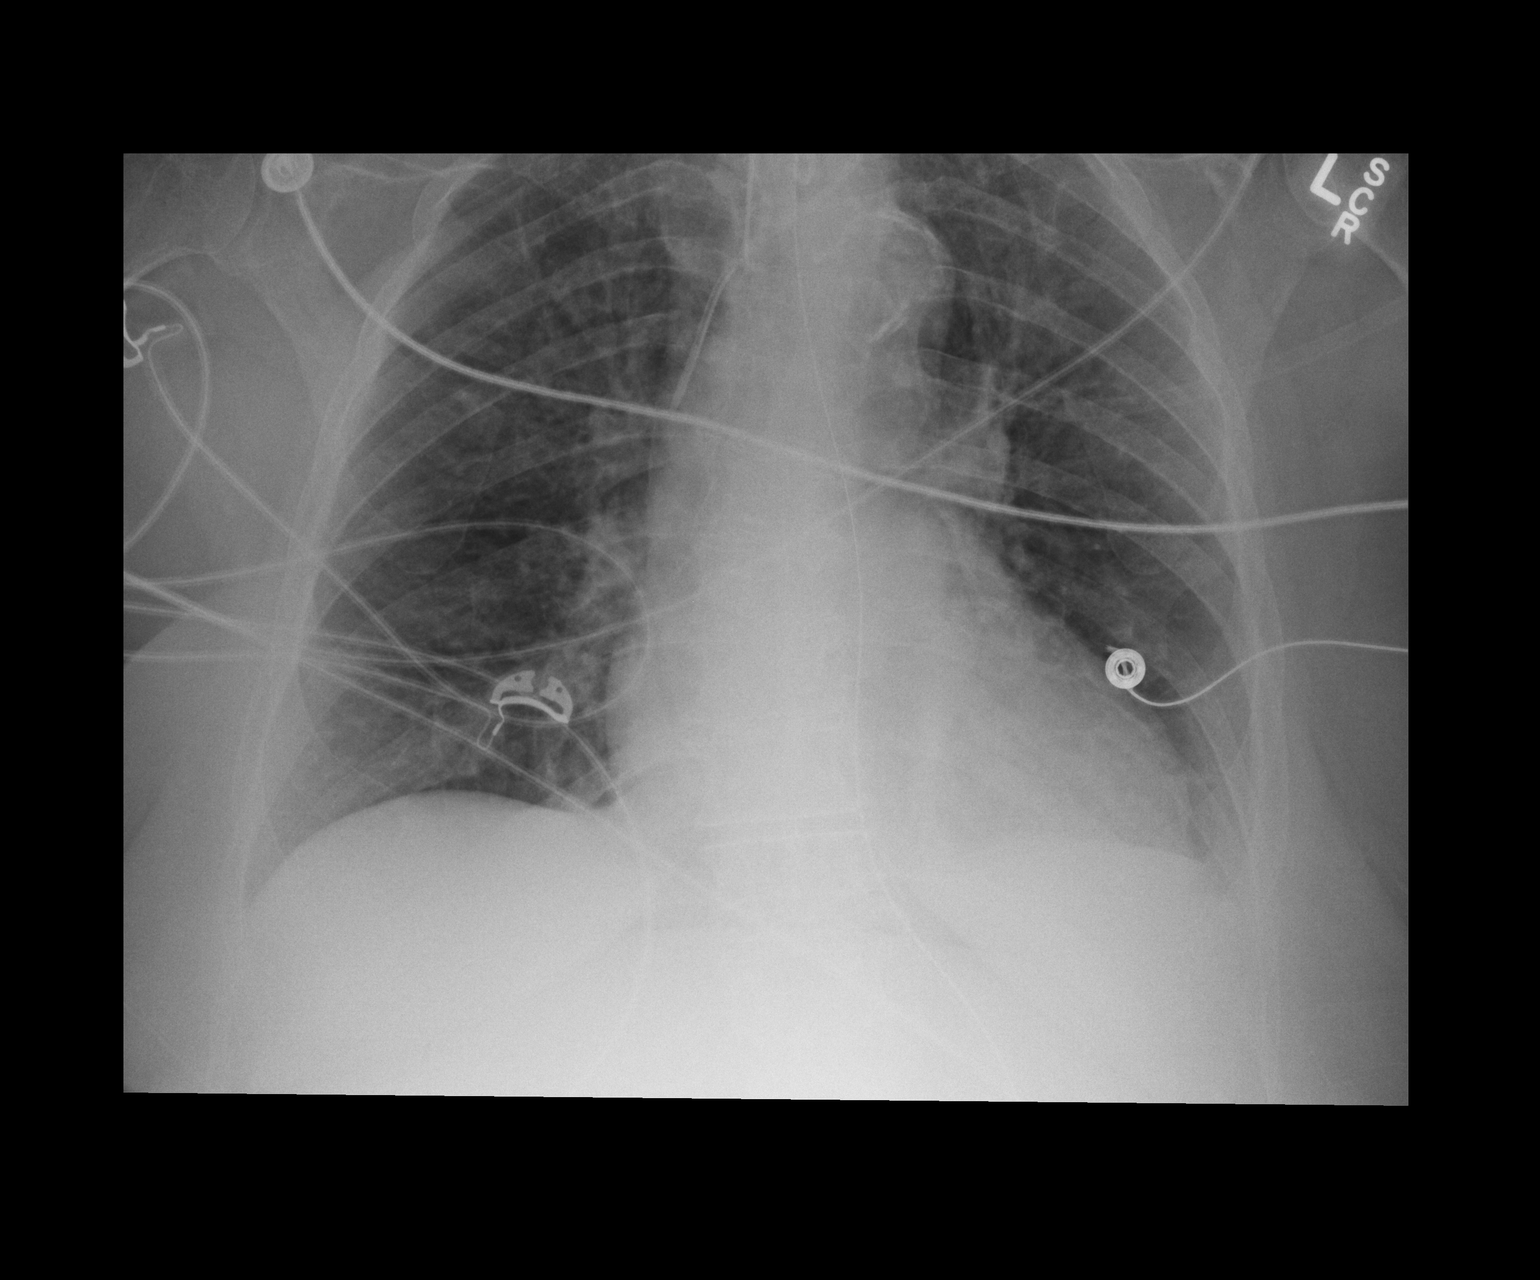

[1 of 1 positions shown; findings below may reference images not displayed]

FINDINGS: Compared to the prior examination, the endotracheal tube
has been withdrawn slightly, now with tip approximately 3.6 cm
above the carina. There is a left-sided internal jugular central
venous catheter with tip terminating in the mid superior vena cava.
A nasogastric tube is seen extending into the stomach, however, the
tip of the nasogastric tube extends below the lower margin of the
image.  Lung volumes have improved.  There are resolving bibasilar
opacities, compatible with resolving areas of subsegmental
atelectasis.  Small left pleural effusion.  Pulmonary venous
engorgement without frank pulmonary edema.  Heart size is
borderline to mildly enlarged.  Mediastinal contours are
unremarkable.  Atherosclerosis in the thoracic aorta.
IMPRESSION: 1.  Support apparatus, as above.  Endotracheal tube is now
appropriately located.
2.  Improving lung volumes with decreasing bibasilar subsegmental
atelectasis.
3.  Small left pleural effusion.
4.  Borderline to mildly enlarged cardiac silhouette.
5.  Atherosclerosis.

## 2014-05-14 ENCOUNTER — Ambulatory Visit (INDEPENDENT_AMBULATORY_CARE_PROVIDER_SITE_OTHER): Payer: Commercial Managed Care - HMO | Admitting: Internal Medicine

## 2014-05-14 ENCOUNTER — Other Ambulatory Visit (INDEPENDENT_AMBULATORY_CARE_PROVIDER_SITE_OTHER): Payer: Commercial Managed Care - HMO

## 2014-05-14 ENCOUNTER — Ambulatory Visit (INDEPENDENT_AMBULATORY_CARE_PROVIDER_SITE_OTHER)
Admission: RE | Admit: 2014-05-14 | Discharge: 2014-05-14 | Disposition: A | Discharge status: Home / Self Care | Payer: Commercial Managed Care - HMO | Source: Ambulatory Visit | Attending: Internal Medicine | Admitting: Internal Medicine

## 2014-05-14 ENCOUNTER — Encounter: Payer: Self-pay | Admitting: Internal Medicine

## 2014-05-14 VITALS — BP 130/72 | HR 64 | Ht 63.0 in | Wt 175.0 lb

## 2014-05-14 DIAGNOSIS — Z0000 Encounter for general adult medical examination without abnormal findings: Secondary | ICD-10-CM | POA: Diagnosis not present

## 2014-05-14 DIAGNOSIS — I10 Essential (primary) hypertension: Secondary | ICD-10-CM

## 2014-05-14 DIAGNOSIS — M25551 Pain in right hip: Secondary | ICD-10-CM

## 2014-05-14 DIAGNOSIS — I255 Ischemic cardiomyopathy: Secondary | ICD-10-CM

## 2014-05-14 DIAGNOSIS — I2510 Atherosclerotic heart disease of native coronary artery without angina pectoris: Secondary | ICD-10-CM | POA: Diagnosis not present

## 2014-05-14 DIAGNOSIS — I5022 Chronic systolic (congestive) heart failure: Secondary | ICD-10-CM

## 2014-05-14 LAB — BASIC METABOLIC PANEL
BUN: 15 mg/dL (ref 6–23)
CO2: 32 mEq/L (ref 19–32)
Calcium: 10.3 mg/dL (ref 8.4–10.5)
Chloride: 99 mEq/L (ref 96–112)
Creatinine, Ser: 1 mg/dL (ref 0.40–1.20)
GFR: 57.18 mL/min — AB (ref >60.00)
GLUCOSE: 151 mg/dL — AB (ref 70–99)
POTASSIUM: 3.8 meq/L (ref 3.5–5.1)
SODIUM: 139 meq/L (ref 135–145)

## 2014-05-14 LAB — HEMOGLOBIN A1C: Hgb A1c MFr Bld: 7.4 % — ABNORMAL HIGH (ref 4.6–6.5)

## 2014-05-14 LAB — HEPATIC FUNCTION PANEL
ALBUMIN: 4.5 g/dL (ref 3.5–5.2)
ALK PHOS: 87 U/L (ref 39–117)
ALT: 19 U/L (ref 0–35)
AST: 21 U/L (ref 0–37)
Bilirubin, Direct: 0.1 mg/dL (ref 0.0–0.3)
TOTAL PROTEIN: 7.1 g/dL (ref 6.0–8.3)
Total Bilirubin: 0.9 mg/dL (ref 0.2–1.2)

## 2014-05-14 LAB — TSH: TSH: 2.48 u[IU]/mL (ref 0.35–4.50)

## 2014-05-14 MED ORDER — VALSARTAN 80 MG PO TABS: 80.0000 mg | ORAL_TABLET | Freq: Every day | ORAL | Status: DC

## 2014-05-14 MED ORDER — POTASSIUM CHLORIDE CRYS ER 20 MEQ PO TBCR: 20.0000 meq | EXTENDED_RELEASE_TABLET | Freq: Every day | ORAL | Status: DC

## 2014-05-14 NOTE — Assessment & Plan Note (Addendum)
4/16 R hip pain - Troch bursitis X ray Massage, ice and stretching Will inject if needed

## 2014-05-14 NOTE — Assessment & Plan Note (Signed)
  On Valsartan, Bisoprolol, NTG prn, Lipitor No angina

## 2014-05-14 NOTE — Progress Notes (Signed)
Subjective:     HPI  The patient is here for a wellness exam  C/o R hip pain x 1.5 week  The patient presents for a follow-up of  chronic hypertension, chronic dyslipidemia, type 2 diabetes controlled with medicines  Hx: Alicia Guerrero has had a remarkable medical history: CAD with MI 09/2011: 24 days in ICU and comatose who has made a good recovery. She has a significant ischemic CM and has an AICD. She was hospitalized Nov 2014 for CVA - hospital d/c summary, labs and imaging reviewed. Since d/c she has done well with no sequelae. She has successfully transitioned to NOAC from coumadin. She did have a bad wisdom tooth with bleeding but this has stablized.  Past Medical History   Diagnosis  Date   .  Left bundle branch block    .  Atrial fibrillation    .  CAD (coronary artery disease)      a. s/p stent to RCA 1996; b. s/p cutting balloon PTCA to LAD 2001; c. s/p Cypher DES to LAD 2003; d. NSTEMI 4/12: DES to dRCA; e. Lantana 9/13 (after presentation with cardiac arrest and + Nz's for NSTEMI): pLAD 30% ISR, oD1 30%, dLAD 60-70%, pOM1 (small vessel) 90%, oOM2 95% (moderate to large vessel), mCFX 30%, pOM3 30-40%, dRCA stents and pPDA stents patent with 30% ISR => med Rx rec.   .  Ischemic cardiomyopathy      a. EF 40% at cath 4/12 with AL and Inf HK; b. Echo after presentation with cardiac arrest 9/13: EF 40-45% and severe inf HK to AK c/w infarction, PASP 42   .  HLD (hyperlipidemia)    .  HTN (hypertension)    .  Other atopic dermatitis and related conditions    .  Allergic rhinitis due to pollen    .  Asthma    .  Other and unspecified ovarian cyst    .  Personal history of malignant neoplasm of breast    .  Personal history of hyperthyroidism    .  Chronic eczema      hands   .  Hemorrhoids    .  Cancer    .  Bradycardia      a. coreg tapered off 05/2011 => b. Metoprolol restarted after cardiac arrest 09/2011   .  Cardiac arrest  09/2011     a. VT/VF in community; resusc unsuccessful; PEA in  ED; multiple defibs => revived; [employer]; c/b by VDRF, cardiogenic shock; LHC with stable anatomy => med Rx; difficult ween from vent => s/p trach; d/c to SNF   .  Chronic systolic heart failure    .  Biventricular ICD (implantable cardiac defibrillator) in place      St Jude 10/2011 (implant MD: Allred)    Past Surgical History   Procedure  Laterality  Date   .  Oophorectomy     .  Abdominal hysterectomy     .  Ptca     .  Cholecystectomy     .  Coronary stent placement   may and july 2012   .  Breast lumpectomy   08/24/2003     right lumpectomy+sln,T2N0,ERPR+,Her2-   .  Polypectomy     .  Colonoscopy     .  Video bronchoscopy   10/26/2011     Procedure: VIDEO BRONCHOSCOPY WITHOUT FLUORO; Surgeon: Raylene Miyamoto, MD; Location: [facility]; Service: Endoscopy; Laterality: Bilateral;    Family History   Problem  Relation  Age of Onset   .  Uterine cancer  Mother    .  Hypertension  Mother    .  Coronary artery disease  Father    .  Heart disease  Sister    .  Heart disease  Brother    .  Endometrial cancer  Sister    .  Heart disease  Brother    .  Clotting disorder  Brother      blood clot   .  Colon cancer  Neg Hx     History    Social History   .  Marital Status:  Married     Spouse Name:  N/A     Number of Children:  2   .  Years of Education:  N/A    Occupational History   .  Retired     Social History Main Topics   .  Smoking status:  Never Smoker   .  Smokeless tobacco:  Never Used   .  Alcohol Use:  No   .  Drug Use:  No   .  Sexual Activity:  Not on file    Other Topics  Concern   .  Not on file    Social History Narrative    0 caffeine drinks daily    Current Outpatient Prescriptions on File Prior to Visit   Medication  Sig  Dispense  Refill   .  acetaminophen (TYLENOL) 325 MG tablet  Take 650 mg by mouth every 6 (six) hours as needed. For pain     .  apixaban (ELIQUIS) 5 MG TABS tablet  Take 1 tablet (5 mg total) by mouth 2 (two)  times daily.  60 tablet  2   .  atorvastatin (LIPITOR) 40 MG tablet  Take 1 tablet (40 mg total) by mouth daily.  90 tablet  3   .  CALCIUM PO  Take 1 tablet by mouth daily.     .  carvedilol (COREG) 25 MG tablet  Take 1 tablet (25 mg total) by mouth 2 (two) times daily with a meal.  180 tablet  3   .  cetirizine (ZYRTEC) 10 MG tablet  Take 10 mg by mouth as needed for allergies.     .  diphenhydrAMINE (BENADRYL) 25 mg capsule  Take 25 mg by mouth at bedtime as needed. For sleep     .  furosemide (LASIX) 40 MG tablet  Take 1 tablet (40 mg total) by mouth daily.  90 tablet  3   .  Multiple Vitamin (MULTIVITAMIN WITH MINERALS) TABS  Take 1 tablet by mouth daily.     .  nitroGLYCERIN (NITROSTAT) 0.4 MG SL tablet  Place 0.4 mg under the tongue every 5 (five) minutes x 3 doses as needed for chest pain. Chest pain     .  potassium chloride SA (K-DUR,KLOR-CON) 20 MEQ tablet  Take 1 tablet (20 mEq total) by mouth daily.  90 tablet  1   .  spironolactone (ALDACTONE) 25 MG tablet  Take 1 tablet (25 mg total) by mouth daily.  90 tablet  3   .  trandolapril ([name]) 4 MG tablet  Take 1 tablet (4 mg total) by mouth daily.  90 tablet  3    BP Readings from Last 3 Encounters:  05/14/14 130/72  04/24/14 134/80  02/19/14 126/80   Wt Readings from Last 3 Encounters:  05/14/14 175 lb (79.379 kg)  04/24/14 176 lb (79.833 kg)  02/19/14 174 lb 12.8 oz (79.289 kg)      Review of Systems  Constitutional: Negative for chills, activity change, appetite change, fatigue and unexpected weight change.  HENT: Negative for congestion, mouth sores and sinus pressure.   Eyes: Negative for visual disturbance.  Respiratory: Negative for cough and chest tightness.   Gastrointestinal: Negative for nausea and abdominal pain.  Genitourinary: Negative for frequency, difficulty urinating and vaginal pain.  Musculoskeletal: Negative for back pain and gait problem.  Skin: Negative for pallor and rash.  Neurological: Negative  for dizziness, tremors, weakness, numbness and headaches.  Psychiatric/Behavioral: Positive for decreased concentration. Negative for suicidal ideas, behavioral problems, confusion, sleep disturbance and agitation. The patient is not nervous/anxious.        Objective:   Physical Exam  Constitutional: She appears well-developed. No distress.  Obese NAD  HENT:  Head: Normocephalic.  Right Ear: External ear normal.  Left Ear: External ear normal.  Nose: Nose normal.  Mouth/Throat: Oropharynx is clear and moist.  Eyes: Conjunctivae are normal. Pupils are equal, round, and reactive to light. Right eye exhibits no discharge. Left eye exhibits no discharge.  Neck: Normal range of motion. Neck supple. No JVD present. No tracheal deviation present. No thyromegaly present.  Cardiovascular: Normal rate, regular rhythm and normal heart sounds.   Pulmonary/Chest: No stridor. No respiratory distress. She has no wheezes.  Abdominal: Soft. Bowel sounds are normal. She exhibits no distension and no mass. There is no tenderness. There is no rebound and no guarding.  Musculoskeletal: She exhibits no edema or tenderness.  Lymphadenopathy:    She has no cervical adenopathy.  Neurological: She displays normal reflexes. No cranial nerve deficit. She exhibits normal muscle tone. Coordination normal.  Skin: No rash noted. No erythema.  Psychiatric: She has a normal mood and affect. Her behavior is normal. Judgment and thought content normal.  B hips NT w/good ROM T. Major is tender on R  Lab Results  Component Value Date   WBC 7.7 08/15/2013   HGB 13.0 08/15/2013   HCT 39.8 08/15/2013   PLT 273.0 08/15/2013   GLUCOSE 120* 08/15/2013   CHOL 180 12/06/2012   TRIG 176* 12/06/2012   HDL 48 12/06/2012   LDLDIRECT 91.1 03/21/2010   LDLCALC 97 12/06/2012   ALT 17 12/05/2012   AST 22 12/05/2012   NA 139 08/15/2013   K 4.3 08/15/2013   CL 103 08/15/2013   CREATININE 1.0 08/15/2013   BUN 20 08/15/2013     CO2 31 08/15/2013   TSH 1.60 05/16/2013   INR 3.3 12/09/2012   HGBA1C 6.8* 12/06/2012        Assessment & Plan:

## 2014-05-14 NOTE — Patient Instructions (Signed)
Preventive Care for Adults A healthy lifestyle and preventive care can promote health and wellness. Preventive health guidelines for women include the following key practices.  A routine yearly physical is a good way to check with your health care provider about your health and preventive screening. It is a chance to share any concerns and updates on your health and to receive a thorough exam.  Visit your dentist for a routine exam and preventive care every 6 months. Brush your teeth twice a day and floss once a day. Good oral hygiene prevents tooth decay and gum disease.  The frequency of eye exams is based on your age, health, family medical history, use of contact lenses, and other factors. Follow your health care provider's recommendations for frequency of eye exams.  Eat a healthy diet. Foods like vegetables, fruits, whole grains, low-fat dairy products, and lean protein foods contain the nutrients you need without too many calories. Decrease your intake of foods high in solid fats, added sugars, and salt. Eat the right amount of calories for you.Get information about a proper diet from your health care provider, if necessary.  Regular physical exercise is one of the most important things you can do for your health. Most adults should get at least 150 minutes of moderate-intensity exercise (any activity that increases your heart rate and causes you to sweat) each week. In addition, most adults need muscle-strengthening exercises on 2 or more days a week.  Maintain a healthy weight. The body mass index (BMI) is a screening tool to identify possible weight problems. It provides an estimate of body fat based on height and weight. Your health care provider can find your BMI and can help you achieve or maintain a healthy weight.For adults 20 years and older:  A BMI below 18.5 is considered underweight.  A BMI of 18.5 to 24.9 is normal.  A BMI of 25 to 29.9 is considered overweight.  A BMI of  30 and above is considered obese.  Maintain normal blood lipids and cholesterol levels by exercising and minimizing your intake of saturated fat. Eat a balanced diet with plenty of fruit and vegetables. Blood tests for lipids and cholesterol should begin at age 76 and be repeated every 5 years. If your lipid or cholesterol levels are high, you are over 50, or you are at high risk for heart disease, you may need your cholesterol levels checked more frequently.Ongoing high lipid and cholesterol levels should be treated with medicines if diet and exercise are not working.  If you smoke, find out from your health care provider how to quit. If you do not use tobacco, do not start.  Lung cancer screening is recommended for adults aged 22-80 years who are at high risk for developing lung cancer because of a history of smoking. A yearly low-dose CT scan of the lungs is recommended for people who have at least a 30-pack-year history of smoking and are a current smoker or have quit within the past 15 years. A pack year of smoking is smoking an average of 1 pack of cigarettes a day for 1 year (for example: 1 pack a day for 30 years or 2 packs a day for 15 years). Yearly screening should continue until the smoker has stopped smoking for at least 15 years. Yearly screening should be stopped for people who develop a health problem that would prevent them from having lung cancer treatment.  If you are pregnant, do not drink alcohol. If you are breastfeeding,  be very cautious about drinking alcohol. If you are not pregnant and choose to drink alcohol, do not have more than 1 drink per day. One drink is considered to be 12 ounces (355 mL) of beer, 5 ounces (148 mL) of wine, or 1.5 ounces (44 mL) of liquor.  Avoid use of street drugs. Do not share needles with anyone. Ask for help if you need support or instructions about stopping the use of drugs.  High blood pressure causes heart disease and increases the risk of  stroke. Your blood pressure should be checked at least every 1 to 2 years. Ongoing high blood pressure should be treated with medicines if weight loss and exercise do not work.  If you are 75-52 years old, ask your health care provider if you should take aspirin to prevent strokes.  Diabetes screening involves taking a blood sample to check your fasting blood sugar level. This should be done once every 3 years, after age 15, if you are within normal weight and without risk factors for diabetes. Testing should be considered at a younger age or be carried out more frequently if you are overweight and have at least 1 risk factor for diabetes.  Breast cancer screening is essential preventive care for women. You should practice "breast self-awareness." This means understanding the normal appearance and feel of your breasts and may include breast self-examination. Any changes detected, no matter how small, should be reported to a health care provider. Women in their 58s and 30s should have a clinical breast exam (CBE) by a health care provider as part of a regular health exam every 1 to 3 years. After age 16, women should have a CBE every year. Starting at age 53, women should consider having a mammogram (breast X-ray test) every year. Women who have a family history of breast cancer should talk to their health care provider about genetic screening. Women at a high risk of breast cancer should talk to their health care providers about having an MRI and a mammogram every year.  Breast cancer gene (BRCA)-related cancer risk assessment is recommended for women who have family members with BRCA-related cancers. BRCA-related cancers include breast, ovarian, tubal, and peritoneal cancers. Having family members with these cancers may be associated with an increased risk for harmful changes (mutations) in the breast cancer genes BRCA1 and BRCA2. Results of the assessment will determine the need for genetic counseling and  BRCA1 and BRCA2 testing.  Routine pelvic exams to screen for cancer are no longer recommended for nonpregnant women who are considered low risk for cancer of the pelvic organs (ovaries, uterus, and vagina) and who do not have symptoms. Ask your health care provider if a screening pelvic exam is right for you.  If you have had past treatment for cervical cancer or a condition that could lead to cancer, you need Pap tests and screening for cancer for at least 20 years after your treatment. If Pap tests have been discontinued, your risk factors (such as having a new sexual partner) need to be reassessed to determine if screening should be resumed. Some women have medical problems that increase the chance of getting cervical cancer. In these cases, your health care provider may recommend more frequent screening and Pap tests.  The HPV test is an additional test that may be used for cervical cancer screening. The HPV test looks for the virus that can cause the cell changes on the cervix. The cells collected during the Pap test can be  tested for HPV. The HPV test could be used to screen women aged 30 years and older, and should be used in women of any age who have unclear Pap test results. After the age of 30, women should have HPV testing at the same frequency as a Pap test.  Colorectal cancer can be detected and often prevented. Most routine colorectal cancer screening begins at the age of 50 years and continues through age 75 years. However, your health care provider may recommend screening at an earlier age if you have risk factors for colon cancer. On a yearly basis, your health care provider may provide home test kits to check for hidden blood in the stool. Use of a small camera at the end of a tube, to directly examine the colon (sigmoidoscopy or colonoscopy), can detect the earliest forms of colorectal cancer. Talk to your health care provider about this at age 50, when routine screening begins. Direct  exam of the colon should be repeated every 5-10 years through age 75 years, unless early forms of pre-cancerous polyps or small growths are found.  People who are at an increased risk for hepatitis B should be screened for this virus. You are considered at high risk for hepatitis B if:  You were born in a country where hepatitis B occurs often. Talk with your health care provider about which countries are considered high risk.  Your parents were born in a high-risk country and you have not received a shot to protect against hepatitis B (hepatitis B vaccine).  You have HIV or AIDS.  You use needles to inject street drugs.  You live with, or have sex with, someone who has hepatitis B.  You get hemodialysis treatment.  You take certain medicines for conditions like cancer, organ transplantation, and autoimmune conditions.  Hepatitis C blood testing is recommended for all people born from 1945 through 1965 and any individual with known risks for hepatitis C.  Practice safe sex. Use condoms and avoid high-risk sexual practices to reduce the spread of sexually transmitted infections (STIs). STIs include gonorrhea, chlamydia, syphilis, trichomonas, herpes, HPV, and human immunodeficiency virus (HIV). Herpes, HIV, and HPV are viral illnesses that have no cure. They can result in disability, cancer, and death.  You should be screened for sexually transmitted illnesses (STIs) including gonorrhea and chlamydia if:  You are sexually active and are younger than 24 years.  You are older than 24 years and your health care provider tells you that you are at risk for this type of infection.  Your sexual activity has changed since you were last screened and you are at an increased risk for chlamydia or gonorrhea. Ask your health care provider if you are at risk.  If you are at risk of being infected with HIV, it is recommended that you take a prescription medicine daily to prevent HIV infection. This is  called preexposure prophylaxis (PrEP). You are considered at risk if:  You are a heterosexual woman, are sexually active, and are at increased risk for HIV infection.  You take drugs by injection.  You are sexually active with a partner who has HIV.  Talk with your health care provider about whether you are at high risk of being infected with HIV. If you choose to begin PrEP, you should first be tested for HIV. You should then be tested every 3 months for as long as you are taking PrEP.  Osteoporosis is a disease in which the bones lose minerals and strength   with aging. This can result in serious bone fractures or breaks. The risk of osteoporosis can be identified using a bone density scan. Women ages 65 years and over and women at risk for fractures or osteoporosis should discuss screening with their health care providers. Ask your health care provider whether you should take a calcium supplement or vitamin D to reduce the rate of osteoporosis.  Menopause can be associated with physical symptoms and risks. Hormone replacement therapy is available to decrease symptoms and risks. You should talk to your health care provider about whether hormone replacement therapy is right for you.  Use sunscreen. Apply sunscreen liberally and repeatedly throughout the day. You should seek shade when your shadow is shorter than you. Protect yourself by wearing long sleeves, pants, a wide-brimmed hat, and sunglasses year round, whenever you are outdoors.  Once a month, do a whole body skin exam, using a mirror to look at the skin on your back. Tell your health care provider of new moles, moles that have irregular borders, moles that are larger than a pencil eraser, or moles that have changed in shape or color.  Stay current with required vaccines (immunizations).  Influenza vaccine. All adults should be immunized every year.  Tetanus, diphtheria, and acellular pertussis (Td, Tdap) vaccine. Pregnant women should  receive 1 dose of Tdap vaccine during each pregnancy. The dose should be obtained regardless of the length of time since the last dose. Immunization is preferred during the 27th-36th week of gestation. An adult who has not previously received Tdap or who does not know her vaccine status should receive 1 dose of Tdap. This initial dose should be followed by tetanus and diphtheria toxoids (Td) booster doses every 10 years. Adults with an unknown or incomplete history of completing a 3-dose immunization series with Td-containing vaccines should begin or complete a primary immunization series including a Tdap dose. Adults should receive a Td booster every 10 years.  Varicella vaccine. An adult without evidence of immunity to varicella should receive 2 doses or a second dose if she has previously received 1 dose. Pregnant females who do not have evidence of immunity should receive the first dose after pregnancy. This first dose should be obtained before leaving the health care facility. The second dose should be obtained 4-8 weeks after the first dose.  Human papillomavirus (HPV) vaccine. Females aged 13-26 years who have not received the vaccine previously should obtain the 3-dose series. The vaccine is not recommended for use in pregnant females. However, pregnancy testing is not needed before receiving a dose. If a female is found to be pregnant after receiving a dose, no treatment is needed. In that case, the remaining doses should be delayed until after the pregnancy. Immunization is recommended for any person with an immunocompromised condition through the age of 26 years if she did not get any or all doses earlier. During the 3-dose series, the second dose should be obtained 4-8 weeks after the first dose. The third dose should be obtained 24 weeks after the first dose and 16 weeks after the second dose.  Zoster vaccine. One dose is recommended for adults aged 60 years or older unless certain conditions are  present.  Measles, mumps, and rubella (MMR) vaccine. Adults born before 1957 generally are considered immune to measles and mumps. Adults born in 1957 or later should have 1 or more doses of MMR vaccine unless there is a contraindication to the vaccine or there is laboratory evidence of immunity to   each of the three diseases. A routine second dose of MMR vaccine should be obtained at least 28 days after the first dose for students attending postsecondary schools, health care workers, or international travelers. People who received inactivated measles vaccine or an unknown type of measles vaccine during 1963-1967 should receive 2 doses of MMR vaccine. People who received inactivated mumps vaccine or an unknown type of mumps vaccine before 1979 and are at high risk for mumps infection should consider immunization with 2 doses of MMR vaccine. For females of childbearing age, rubella immunity should be determined. If there is no evidence of immunity, females who are not pregnant should be vaccinated. If there is no evidence of immunity, females who are pregnant should delay immunization until after pregnancy. Unvaccinated health care workers born before 1957 who lack laboratory evidence of measles, mumps, or rubella immunity or laboratory confirmation of disease should consider measles and mumps immunization with 2 doses of MMR vaccine or rubella immunization with 1 dose of MMR vaccine.  Pneumococcal 13-valent conjugate (PCV13) vaccine. When indicated, a person who is uncertain of her immunization history and has no record of immunization should receive the PCV13 vaccine. An adult aged 19 years or older who has certain medical conditions and has not been previously immunized should receive 1 dose of PCV13 vaccine. This PCV13 should be followed with a dose of pneumococcal polysaccharide (PPSV23) vaccine. The PPSV23 vaccine dose should be obtained at least 8 weeks after the dose of PCV13 vaccine. An adult aged 19  years or older who has certain medical conditions and previously received 1 or more doses of PPSV23 vaccine should receive 1 dose of PCV13. The PCV13 vaccine dose should be obtained 1 or more years after the last PPSV23 vaccine dose.  Pneumococcal polysaccharide (PPSV23) vaccine. When PCV13 is also indicated, PCV13 should be obtained first. All adults aged 65 years and older should be immunized. An adult younger than age 65 years who has certain medical conditions should be immunized. Any person who resides in a nursing home or long-term care facility should be immunized. An adult smoker should be immunized. People with an immunocompromised condition and certain other conditions should receive both PCV13 and PPSV23 vaccines. People with human immunodeficiency virus (HIV) infection should be immunized as soon as possible after diagnosis. Immunization during chemotherapy or radiation therapy should be avoided. Routine use of PPSV23 vaccine is not recommended for American Indians, [place] Natives, or people younger than 65 years unless there are medical conditions that require PPSV23 vaccine. When indicated, people who have unknown immunization and have no record of immunization should receive PPSV23 vaccine. One-time revaccination 5 years after the first dose of PPSV23 is recommended for people aged 19-64 years who have chronic kidney failure, nephrotic syndrome, asplenia, or immunocompromised conditions. People who received 1-2 doses of PPSV23 before age 65 years should receive another dose of PPSV23 vaccine at age 65 years or later if at least 5 years have passed since the previous dose. Doses of PPSV23 are not needed for people immunized with PPSV23 at or after age 65 years.  Meningococcal vaccine. Adults with asplenia or persistent complement component deficiencies should receive 2 doses of quadrivalent meningococcal conjugate (MenACWY-D) vaccine. The doses should be obtained at least 2 months apart.  Microbiologists working with certain meningococcal bacteria, military recruits, people at risk during an outbreak, and people who travel to or live in countries with a high rate of meningitis should be immunized. A first-year college student up through age   21 years who is living in a residence hall should receive a dose if she did not receive a dose on or after her 16th birthday. Adults who have certain high-risk conditions should receive one or more doses of vaccine.  Hepatitis A vaccine. Adults who wish to be protected from this disease, have certain high-risk conditions, work with hepatitis A-infected animals, work in hepatitis A research labs, or travel to or work in countries with a high rate of hepatitis A should be immunized. Adults who were previously unvaccinated and who anticipate close contact with an international adoptee during the first 60 days after arrival in the [place] from a country with a high rate of hepatitis A should be immunized.  Hepatitis B vaccine. Adults who wish to be protected from this disease, have certain high-risk conditions, may be exposed to blood or other infectious body fluids, are household contacts or sex partners of hepatitis B positive people, are clients or workers in certain care facilities, or travel to or work in countries with a high rate of hepatitis B should be immunized.  Haemophilus influenzae type b (Hib) vaccine. A previously unvaccinated person with asplenia or sickle cell disease or having a scheduled splenectomy should receive 1 dose of Hib vaccine. Regardless of previous immunization, a recipient of a hematopoietic stem cell transplant should receive a 3-dose series 6-12 months after her successful transplant. Hib vaccine is not recommended for adults with HIV infection. Preventive Services / Frequency Ages 64 to 68 years  Blood pressure check.** / Every 1 to 2 years.  Lipid and cholesterol check.** / Every 5 years beginning at age  22.  Clinical breast exam.** / Every 3 years for women in their 88s and 53s.  BRCA-related cancer risk assessment.** / For women who have family members with a BRCA-related cancer (breast, ovarian, tubal, or peritoneal cancers).  Pap test.** / Every 2 years from ages 90 through 51. Every 3 years starting at age 21 through age 56 or 3 with a history of 3 consecutive normal Pap tests.  HPV screening.** / Every 3 years from ages 24 through ages 1 to 46 with a history of 3 consecutive normal Pap tests.  Hepatitis C blood test.** / For any individual with known risks for hepatitis C.  Skin self-exam. / Monthly.  Influenza vaccine. / Every year.  Tetanus, diphtheria, and acellular pertussis (Tdap, Td) vaccine.** / Consult your health care provider. Pregnant women should receive 1 dose of Tdap vaccine during each pregnancy. 1 dose of Td every 10 years.  Varicella vaccine.** / Consult your health care provider. Pregnant females who do not have evidence of immunity should receive the first dose after pregnancy.  HPV vaccine. / 3 doses over 6 months, if 72 and younger. The vaccine is not recommended for use in pregnant females. However, pregnancy testing is not needed before receiving a dose.  Measles, mumps, rubella (MMR) vaccine.** / You need at least 1 dose of MMR if you were born in 1957 or later. You may also need a 2nd dose. For females of childbearing age, rubella immunity should be determined. If there is no evidence of immunity, females who are not pregnant should be vaccinated. If there is no evidence of immunity, females who are pregnant should delay immunization until after pregnancy.  Pneumococcal 13-valent conjugate (PCV13) vaccine.** / Consult your health care provider.  Pneumococcal polysaccharide (PPSV23) vaccine.** / 1 to 2 doses if you smoke cigarettes or if you have certain conditions.  Meningococcal vaccine.** /  1 dose if you are age 19 to 21 years and a first-year college  student living in a residence hall, or have one of several medical conditions, you need to get vaccinated against meningococcal disease. You may also need additional booster doses.  Hepatitis A vaccine.** / Consult your health care provider.  Hepatitis B vaccine.** / Consult your health care provider.  Haemophilus influenzae type b (Hib) vaccine.** / Consult your health care provider. Ages 40 to 64 years  Blood pressure check.** / Every 1 to 2 years.  Lipid and cholesterol check.** / Every 5 years beginning at age 20 years.  Lung cancer screening. / Every year if you are aged 55-80 years and have a 30-pack-year history of smoking and currently smoke or have quit within the past 15 years. Yearly screening is stopped once you have quit smoking for at least 15 years or develop a health problem that would prevent you from having lung cancer treatment.  Clinical breast exam.** / Every year after age 40 years.  BRCA-related cancer risk assessment.** / For women who have family members with a BRCA-related cancer (breast, ovarian, tubal, or peritoneal cancers).  Mammogram.** / Every year beginning at age 40 years and continuing for as long as you are in good health. Consult with your health care provider.  Pap test.** / Every 3 years starting at age 30 years through age 65 or 70 years with a history of 3 consecutive normal Pap tests.  HPV screening.** / Every 3 years from ages 30 years through ages 65 to 70 years with a history of 3 consecutive normal Pap tests.  Fecal occult blood test (FOBT) of stool. / Every year beginning at age 50 years and continuing until age 75 years. You may not need to do this test if you get a colonoscopy every 10 years.  Flexible sigmoidoscopy or colonoscopy.** / Every 5 years for a flexible sigmoidoscopy or every 10 years for a colonoscopy beginning at age 50 years and continuing until age 75 years.  Hepatitis C blood test.** / For all people born from 1945 through  1965 and any individual with known risks for hepatitis C.  Skin self-exam. / Monthly.  Influenza vaccine. / Every year.  Tetanus, diphtheria, and acellular pertussis (Tdap/Td) vaccine.** / Consult your health care provider. Pregnant women should receive 1 dose of Tdap vaccine during each pregnancy. 1 dose of Td every 10 years.  Varicella vaccine.** / Consult your health care provider. Pregnant females who do not have evidence of immunity should receive the first dose after pregnancy.  Zoster vaccine.** / 1 dose for adults aged 60 years or older.  Measles, mumps, rubella (MMR) vaccine.** / You need at least 1 dose of MMR if you were born in 1957 or later. You may also need a 2nd dose. For females of childbearing age, rubella immunity should be determined. If there is no evidence of immunity, females who are not pregnant should be vaccinated. If there is no evidence of immunity, females who are pregnant should delay immunization until after pregnancy.  Pneumococcal 13-valent conjugate (PCV13) vaccine.** / Consult your health care provider.  Pneumococcal polysaccharide (PPSV23) vaccine.** / 1 to 2 doses if you smoke cigarettes or if you have certain conditions.  Meningococcal vaccine.** / Consult your health care provider.  Hepatitis A vaccine.** / Consult your health care provider.  Hepatitis B vaccine.** / Consult your health care provider.  Haemophilus influenzae type b (Hib) vaccine.** / Consult your health care provider. Ages 65   years and over  Blood pressure check.** / Every 1 to 2 years.  Lipid and cholesterol check.** / Every 5 years beginning at age 22 years.  Lung cancer screening. / Every year if you are aged 73-80 years and have a 30-pack-year history of smoking and currently smoke or have quit within the past 15 years. Yearly screening is stopped once you have quit smoking for at least 15 years or develop a health problem that would prevent you from having lung cancer  treatment.  Clinical breast exam.** / Every year after age 4 years.  BRCA-related cancer risk assessment.** / For women who have family members with a BRCA-related cancer (breast, ovarian, tubal, or peritoneal cancers).  Mammogram.** / Every year beginning at age 40 years and continuing for as long as you are in good health. Consult with your health care provider.  Pap test.** / Every 3 years starting at age 9 years through age 34 or 91 years with 3 consecutive normal Pap tests. Testing can be stopped between 65 and 70 years with 3 consecutive normal Pap tests and no abnormal Pap or HPV tests in the past 10 years.  HPV screening.** / Every 3 years from ages 57 years through ages 64 or 45 years with a history of 3 consecutive normal Pap tests. Testing can be stopped between 65 and 70 years with 3 consecutive normal Pap tests and no abnormal Pap or HPV tests in the past 10 years.  Fecal occult blood test (FOBT) of stool. / Every year beginning at age 15 years and continuing until age 17 years. You may not need to do this test if you get a colonoscopy every 10 years.  Flexible sigmoidoscopy or colonoscopy.** / Every 5 years for a flexible sigmoidoscopy or every 10 years for a colonoscopy beginning at age 86 years and continuing until age 71 years.  Hepatitis C blood test.** / For all people born from [birth year] through 1963 and any individual with known risks for hepatitis C.  Osteoporosis screening.** / A one-time screening for women ages 83 years and over and women at risk for fractures or osteoporosis.  Skin self-exam. / Monthly.  Influenza vaccine. / Every year.  Tetanus, diphtheria, and acellular pertussis (Tdap/Td) vaccine.** / 1 dose of Td every 10 years.  Varicella vaccine.** / Consult your health care provider.  Zoster vaccine.** / 1 dose for adults aged 61 years or older.  Pneumococcal 13-valent conjugate (PCV13) vaccine.** / Consult your health care provider.  Pneumococcal  polysaccharide (PPSV23) vaccine.** / 1 dose for all adults aged 28 years and older.  Meningococcal vaccine.** / Consult your health care provider.  Hepatitis A vaccine.** / Consult your health care provider.  Hepatitis B vaccine.** / Consult your health care provider.  Haemophilus influenzae type b (Hib) vaccine.** / Consult your health care provider. ** Family history and personal history of risk and conditions may change your health care provider's recommendations. Document Released: 02/28/2001 Document Revised: 05/19/2013 Document Reviewed: 05/30/2010 [facility] Patient Information 2015 [place], [place]. This information is not intended to replace advice given to you by your health care provider. Make sure you discuss any questions you have with your health care provider.

## 2014-05-14 NOTE — Assessment & Plan Note (Signed)
On Valsartan, Bisoprolol

## 2014-05-14 NOTE — Assessment & Plan Note (Signed)
Labs

## 2014-05-14 NOTE — Progress Notes (Signed)
Pre visit review using our clinic review tool, if applicable. No additional management support is needed unless otherwise documented below in the visit note. 

## 2014-05-14 NOTE — Assessment & Plan Note (Signed)
Here for medicare wellness/physical  Diet: heart healthy  Physical activity: not sedentary  Depression/mood screen: negative  Hearing: intact to whispered voice  Visual acuity: grossly normal, performs annual eye exam  ADLs: capable  Fall risk: none  Home safety: good  Cognitive evaluation: intact to orientation, naming, recall and repetition  EOL planning: adv directives, full code/ I agree  I have personally reviewed and have noted  1. The patient's medical, surgical and social history  2. Their use of alcohol, tobacco or illicit drugs  3. Their current medications and supplements  4. The patient's functional ability including ADL's, fall risks, home safety risks and hearing or visual impairment.  5. Diet and physical activities  6. Evidence for depression or mood disorders 7. The roster of all physicians providing medical care to patient - is listed in the Snapshot section of the chart and reviewed today.    Today patient counseled on age appropriate routine health concerns for screening and prevention, each reviewed and up to date or declined. Immunizations reviewed and up to date or declined. Labs ordered and reviewed. Risk factors for depression reviewed and negative. Hearing function and visual acuity are intact. ADLs screened and addressed as needed. Functional ability and level of safety reviewed and appropriate. Education, counseling and referrals performed based on assessed risks today. Patient provided with a copy of personalized plan for preventive services.  Colonoscopy 2013

## 2014-05-15 ENCOUNTER — Other Ambulatory Visit: Payer: Self-pay | Admitting: Internal Medicine

## 2014-05-17 IMAGING — CR DG CHEST 1V PORT
1 series · 1 of 1 positions shown · non-contrast
Comparison: Portable exam 8388 hours compared to 10/02/2011 at 9679
hours

CLINICAL DATA: Intubation

PORTABLE CHEST - 1 VIEW

[view not recorded]
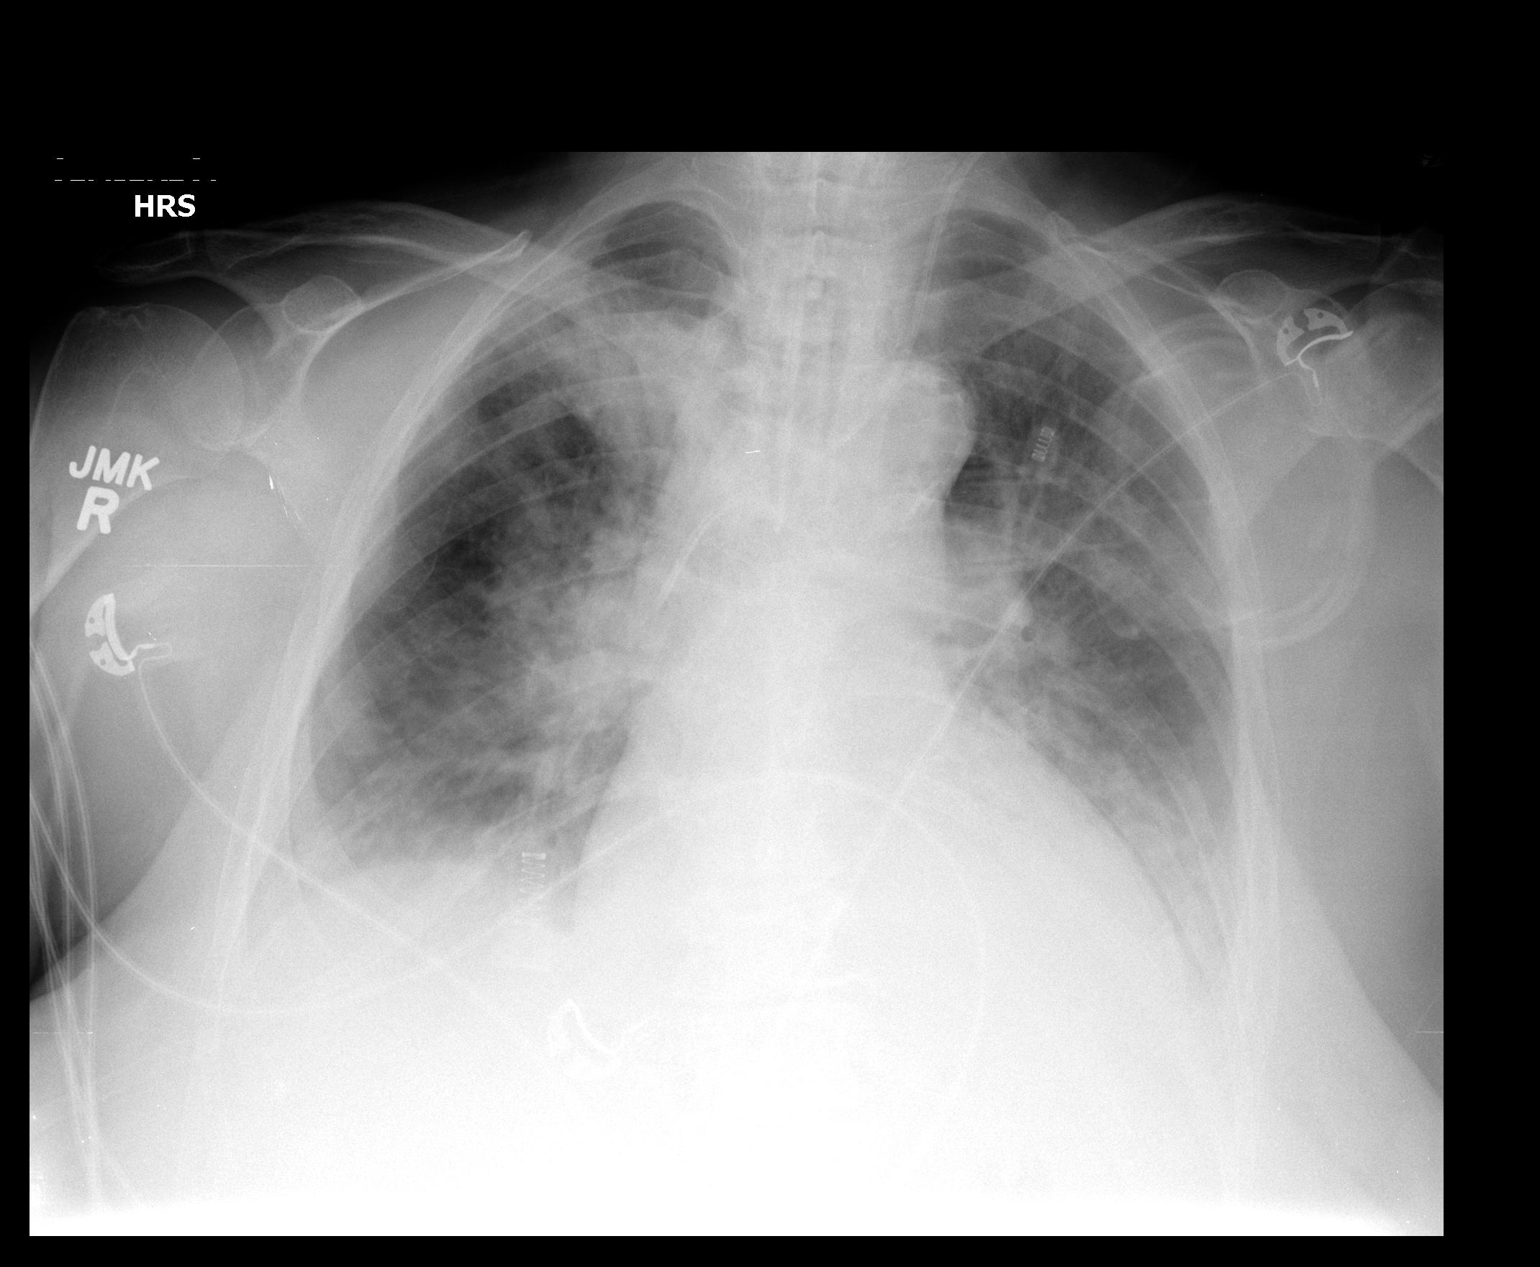

[1 of 1 positions shown; findings below may reference images not displayed]

FINDINGS: Tip of endotracheal tube is 6 mm from carina.
Left jugular Swan-Ganz catheter, tip projecting over SVC.
Previously identified nasogastric tube is not definitely
visualized.
Numerous cardiac monitoring leads project over chest.
Cardiac silhouette remains enlarged.
Bilateral perihilar and basilar infiltrates.
More focal opacity in right upper lobe persists.
Atherosclerotic calcification aorta.
No definite pneumothorax.
IMPRESSION: Tip of endotracheal tube 6 mm above carina, recommend withdrawal 2
cm.
Otherwise no significant interval change.

Critical Value/emergent results were called by telephone at the
time of interpretation on 10/02/2011 at 3131 hours to Adelzo RN on
[id number], who verbally acknowledged these results.

## 2014-05-18 ENCOUNTER — Other Ambulatory Visit: Payer: Self-pay | Admitting: Internal Medicine

## 2014-05-19 IMAGING — CR DG CHEST 1V PORT
1 series · 1 of 1 positions shown · non-contrast
Comparison: Portable chest x-ray of 10/02/2011

CLINICAL DATA: Evaluate endotracheal tube and central venous lines

PORTABLE CHEST - 1 VIEW

[AP]
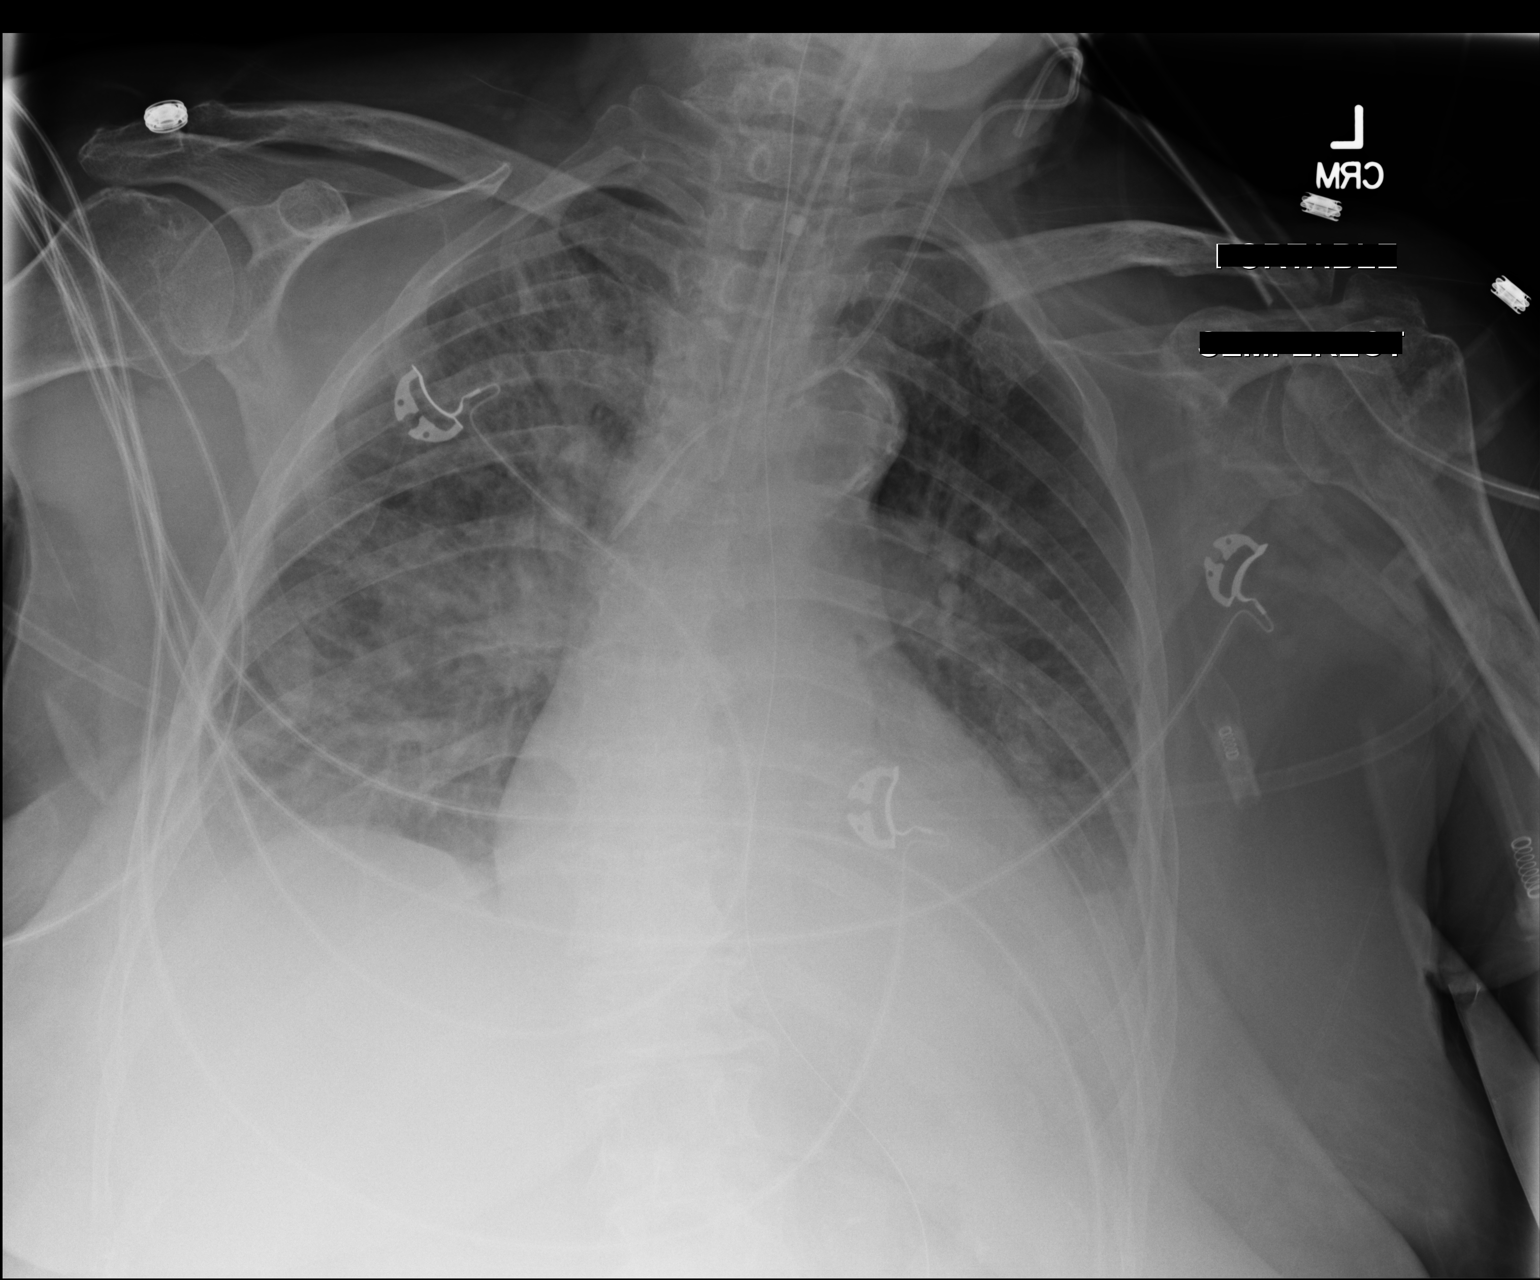

[1 of 1 positions shown; findings below may reference images not displayed]

FINDINGS: The tip of the endotracheal tube is still approximately
1.3 cm above the carina.  There is now more diffuse airspace
disease most consistent with edema and bilateral pleural effusions
with cardiomegaly.  A left central venous line remains with the tip
overlying the mid SVC.  No pneumothorax is noted.
IMPRESSION: 1.  Tip of endotracheal tube only 1.3 cm above carina.
2.  Worsening of apparent CHF with congestion, cardiomegaly, and
effusions.

## 2014-05-21 ENCOUNTER — Encounter (INDEPENDENT_AMBULATORY_CARE_PROVIDER_SITE_OTHER): Payer: Commercial Managed Care - HMO | Admitting: *Deleted

## 2014-05-21 ENCOUNTER — Other Ambulatory Visit: Payer: Self-pay | Admitting: Internal Medicine

## 2014-05-21 DIAGNOSIS — R001 Bradycardia, unspecified: Secondary | ICD-10-CM | POA: Diagnosis not present

## 2014-05-21 DIAGNOSIS — Z95810 Presence of automatic (implantable) cardiac defibrillator: Secondary | ICD-10-CM | POA: Diagnosis not present

## 2014-05-21 DIAGNOSIS — I255 Ischemic cardiomyopathy: Secondary | ICD-10-CM

## 2014-05-21 DIAGNOSIS — I5022 Chronic systolic (congestive) heart failure: Secondary | ICD-10-CM | POA: Diagnosis not present

## 2014-05-21 LAB — CUP PACEART REMOTE DEVICE CHECK
Battery Remaining Percentage: 47 %
Battery Voltage: 2.92 V
Brady Statistic AS VP Percent: 4.7 %
Date Time Interrogation Session: 20160505060018
HIGH POWER IMPEDANCE MEASURED VALUE: 62 Ohm
HighPow Impedance: 62 Ohm
Lead Channel Impedance Value: 350 Ohm
Lead Channel Impedance Value: 690 Ohm
Lead Channel Pacing Threshold Amplitude: 1.125 V
Lead Channel Pacing Threshold Pulse Width: 0.5 ms
Lead Channel Pacing Threshold Pulse Width: 0.5 ms
Lead Channel Setting Pacing Amplitude: 3.25 V
Lead Channel Setting Pacing Pulse Width: 0.5 ms
Lead Channel Setting Pacing Pulse Width: 1 ms
MDC IDC MSMT BATTERY REMAINING LONGEVITY: 28 mo
MDC IDC MSMT LEADCHNL LV PACING THRESHOLD AMPLITUDE: 2.75 V
MDC IDC MSMT LEADCHNL LV PACING THRESHOLD PULSEWIDTH: 1 ms
MDC IDC MSMT LEADCHNL RA IMPEDANCE VALUE: 380 Ohm
MDC IDC MSMT LEADCHNL RA SENSING INTR AMPL: 2.9 mV
MDC IDC MSMT LEADCHNL RV PACING THRESHOLD AMPLITUDE: 0.875 V
MDC IDC MSMT LEADCHNL RV SENSING INTR AMPL: 11.3 mV
MDC IDC SET LEADCHNL RA PACING AMPLITUDE: 2 V
MDC IDC SET LEADCHNL RV PACING AMPLITUDE: 2 V
MDC IDC SET LEADCHNL RV SENSING SENSITIVITY: 0.5 mV
MDC IDC STAT BRADY AP VP PERCENT: 94 %
MDC IDC STAT BRADY AP VS PERCENT: 1 %
MDC IDC STAT BRADY AS VS PERCENT: 1 %
MDC IDC STAT BRADY RA PERCENT PACED: 93 %
Pulse Gen Serial Number: 7063327
Zone Setting Detection Interval: 300 ms
Zone Setting Detection Interval: 340 ms

## 2014-05-21 IMAGING — CR DG CHEST 1V PORT
1 series · 1 of 1 positions shown · non-contrast
Comparison: 10/05/2011.

CLINICAL DATA: Tracheostomy.

PORTABLE CHEST - 1 VIEW

[AP]
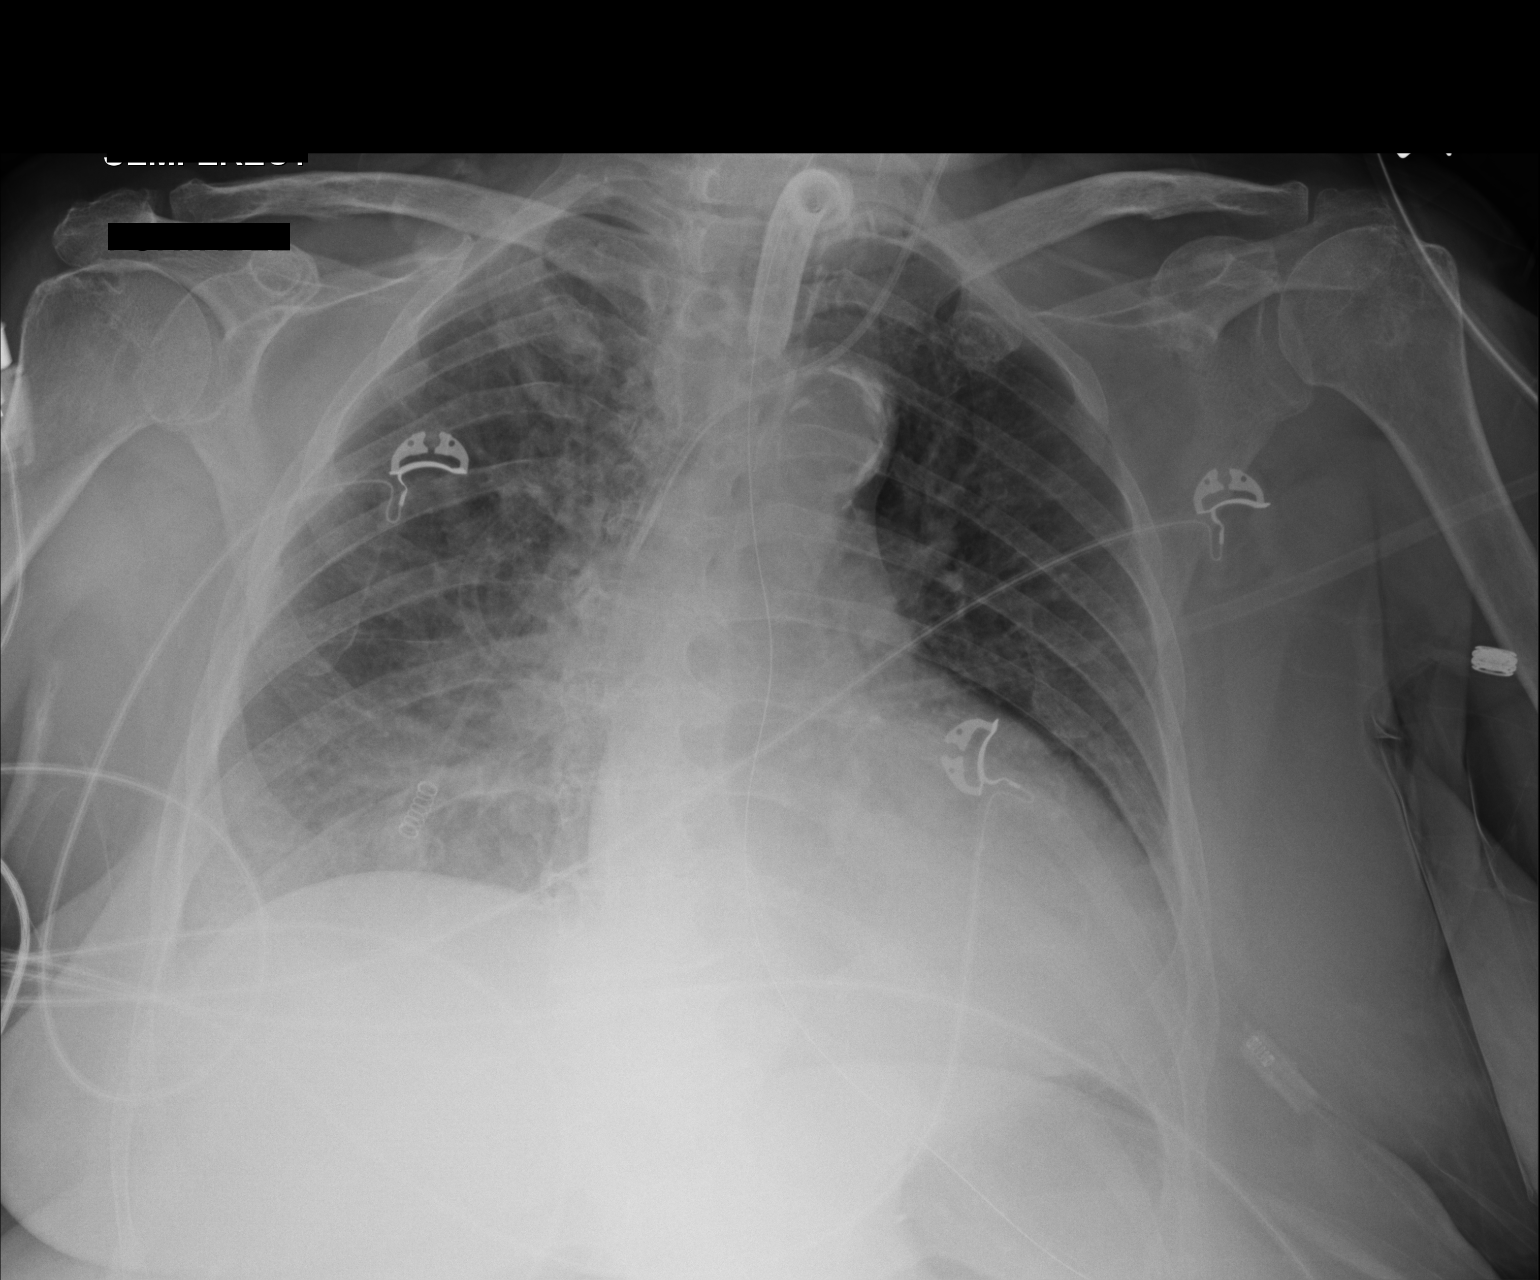

[1 of 1 positions shown; findings below may reference images not displayed]

FINDINGS: Tracheostomy is unchanged.  Left IJ central line remains
present.  Interval introduction of a nasogastric tube with the tip
not visualized.  Shifting pleural effusions are present with
bilateral basilar atelectasis.  Pleural effusions appear small.
Aortic atherosclerosis.  Patient rotated to the left. Monitoring
leads are projected over the chest.  Cardiomediastinal contours are
unchanged.
IMPRESSION: 1.  The tracheostomy and left IJ central line are unchanged.
2.  Interval introduction of nasogastric tube.  Tip not visualized.
3.  Basilar atelectasis and shifting pleural effusions.

## 2014-05-22 IMAGING — CR DG CHEST 1V PORT
1 series · 1 of 1 positions shown · non-contrast
Comparison: 10/06/2011; 10/05/2011; 10/02/2011

CLINICAL DATA: Tracheostomy tube, evaluate pulmonary infiltrates
and lines

PORTABLE CHEST - 1 VIEW

[AP]
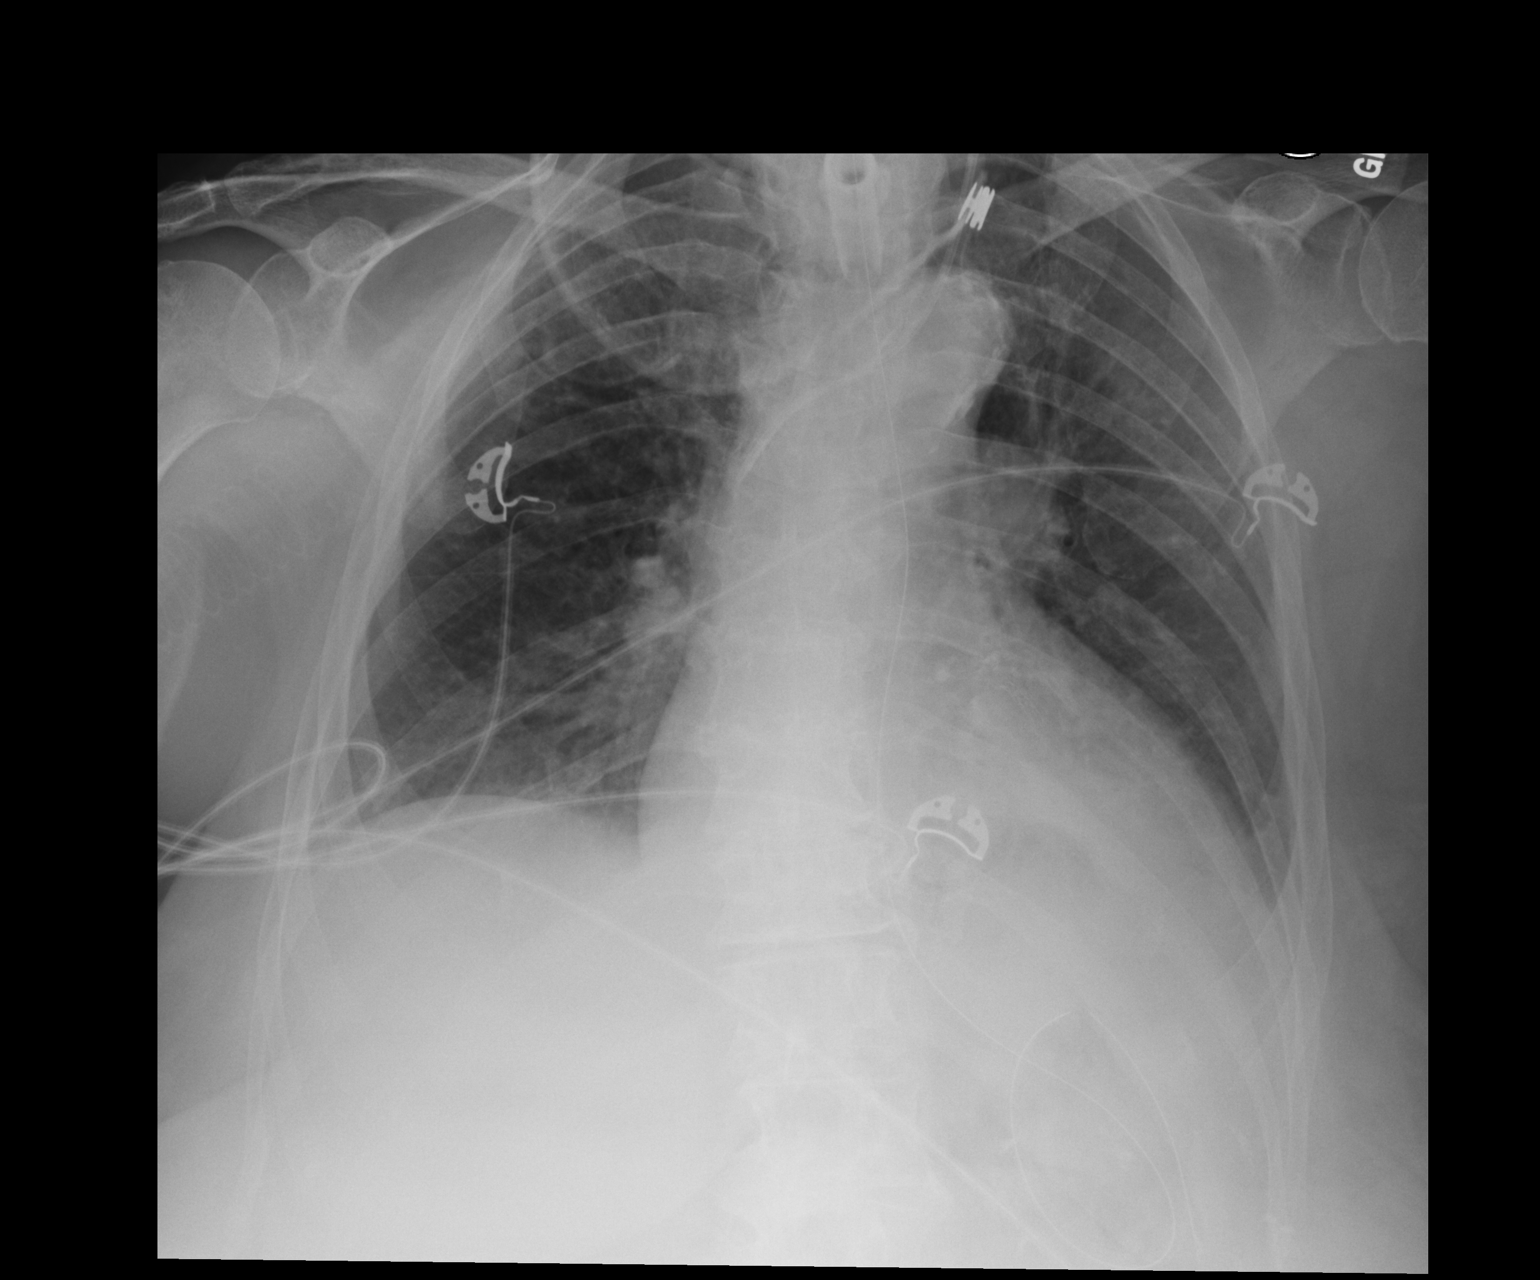

[1 of 1 positions shown; findings below may reference images not displayed]

FINDINGS: Grossly unchanged enlarged cardiac silhouette and
mediastinal contours without chronic calcifications in the aortic
arch and descending thoracic aorta.  Stable position of support
apparatus.  Minimal improved aeration of the lungs with persistent
basilar heterogeneous opacities, left greater than right.  Small
left-sided pleural effusion is unchanged.  No pneumothorax.
Unchanged bones.
IMPRESSION: 1.  Stable positioning of support apparatus.  No pneumothorax.
2.  Improved aeration of the lungs with persistent basilar
opacities, left greater than right, likely atelectasis.
3.  Unchanged small left-sided effusion.

## 2014-06-08 ENCOUNTER — Encounter: Payer: Self-pay | Admitting: Cardiology

## 2014-06-17 ENCOUNTER — Encounter: Payer: Self-pay | Admitting: Internal Medicine

## 2014-08-20 ENCOUNTER — Encounter: Payer: Self-pay | Admitting: Internal Medicine

## 2014-08-20 ENCOUNTER — Ambulatory Visit (INDEPENDENT_AMBULATORY_CARE_PROVIDER_SITE_OTHER): Payer: Commercial Managed Care - HMO | Admitting: *Deleted

## 2014-08-20 ENCOUNTER — Telehealth: Payer: Self-pay | Admitting: Cardiology

## 2014-08-20 DIAGNOSIS — I5022 Chronic systolic (congestive) heart failure: Secondary | ICD-10-CM

## 2014-08-20 DIAGNOSIS — I255 Ischemic cardiomyopathy: Secondary | ICD-10-CM

## 2014-08-20 NOTE — Telephone Encounter (Signed)
LMOVM reminding pt to send remote transmission.   

## 2014-08-20 NOTE — Progress Notes (Signed)
Remote ICD transmission.   

## 2014-08-27 LAB — CUP PACEART REMOTE DEVICE CHECK
Battery Remaining Longevity: 26 mo
Battery Remaining Percentage: 43 %
Brady Statistic AP VP Percent: 94 %
Brady Statistic AS VP Percent: 5.2 %
Brady Statistic AS VS Percent: 1 %
Brady Statistic RA Percent Paced: 93 %
Date Time Interrogation Session: 20160804153837
HighPow Impedance: 75 Ohm
HighPow Impedance: 75 Ohm
Lead Channel Impedance Value: 350 Ohm
Lead Channel Impedance Value: 400 Ohm
Lead Channel Impedance Value: 700 Ohm
Lead Channel Pacing Threshold Amplitude: 0.875 V
Lead Channel Pacing Threshold Amplitude: 2.75 V
Lead Channel Pacing Threshold Pulse Width: 0.5 ms
Lead Channel Pacing Threshold Pulse Width: 1 ms
Lead Channel Sensing Intrinsic Amplitude: 12 mV
Lead Channel Sensing Intrinsic Amplitude: 3 mV
Lead Channel Setting Pacing Amplitude: 2 V
Lead Channel Setting Pacing Pulse Width: 0.5 ms
Lead Channel Setting Pacing Pulse Width: 1 ms
MDC IDC MSMT BATTERY VOLTAGE: 2.9 V
MDC IDC MSMT LEADCHNL RA PACING THRESHOLD AMPLITUDE: 1 V
MDC IDC MSMT LEADCHNL RA PACING THRESHOLD PULSEWIDTH: 0.5 ms
MDC IDC SET LEADCHNL LV PACING AMPLITUDE: 3.25 V
MDC IDC SET LEADCHNL RV PACING AMPLITUDE: 2 V
MDC IDC SET LEADCHNL RV SENSING SENSITIVITY: 0.5 mV
MDC IDC SET ZONE DETECTION INTERVAL: 300 ms
MDC IDC SET ZONE DETECTION INTERVAL: 340 ms
MDC IDC STAT BRADY AP VS PERCENT: 1 %

## 2014-09-14 ENCOUNTER — Telehealth: Payer: Self-pay | Admitting: Internal Medicine

## 2014-09-14 DIAGNOSIS — Z853 Personal history of malignant neoplasm of breast: Secondary | ICD-10-CM

## 2014-09-14 NOTE — Telephone Encounter (Signed)
Patient need to talk to you about being referred to  Dr Marlou Starks the 29th 2:50, please advise

## 2014-09-15 NOTE — Telephone Encounter (Signed)
I called pt- she needs referral for Dr. Marlou Starks @ [facility] for 10/15/14 AND a referral Dr. Niederwald Blas for mammogram also for the 29th.

## 2014-09-24 ENCOUNTER — Other Ambulatory Visit: Payer: Self-pay | Admitting: Internal Medicine

## 2014-09-25 ENCOUNTER — Encounter: Payer: Self-pay | Admitting: Cardiology

## 2014-10-15 DIAGNOSIS — C50411 Malignant neoplasm of upper-outer quadrant of right female breast: Secondary | ICD-10-CM | POA: Diagnosis not present

## 2014-10-15 DIAGNOSIS — Z853 Personal history of malignant neoplasm of breast: Secondary | ICD-10-CM | POA: Diagnosis not present

## 2014-10-15 DIAGNOSIS — Z1231 Encounter for screening mammogram for malignant neoplasm of breast: Secondary | ICD-10-CM | POA: Diagnosis not present

## 2014-10-15 LAB — HM MAMMOGRAPHY

## 2014-10-21 ENCOUNTER — Encounter: Payer: Self-pay | Admitting: Internal Medicine

## 2014-11-23 ENCOUNTER — Other Ambulatory Visit: Payer: Self-pay | Admitting: Internal Medicine

## 2014-11-23 ENCOUNTER — Ambulatory Visit (INDEPENDENT_AMBULATORY_CARE_PROVIDER_SITE_OTHER): Payer: Commercial Managed Care - HMO | Admitting: *Deleted

## 2014-11-23 DIAGNOSIS — I5022 Chronic systolic (congestive) heart failure: Secondary | ICD-10-CM | POA: Diagnosis not present

## 2014-11-23 DIAGNOSIS — I255 Ischemic cardiomyopathy: Secondary | ICD-10-CM | POA: Diagnosis not present

## 2014-11-23 LAB — CUP PACEART REMOTE DEVICE CHECK
Battery Remaining Longevity: 22 mo
Battery Remaining Percentage: 38 %
Battery Voltage: 2.9 V
Brady Statistic AP VP Percent: 94 %
Brady Statistic AS VS Percent: 1 %
HIGH POWER IMPEDANCE MEASURED VALUE: 70 Ohm
HIGH POWER IMPEDANCE MEASURED VALUE: 70 Ohm
Implantable Lead Implant Date: 20131017
Lead Channel Impedance Value: 360 Ohm
Lead Channel Impedance Value: 700 Ohm
Lead Channel Pacing Threshold Amplitude: 0.875 V
Lead Channel Pacing Threshold Amplitude: 1 V
Lead Channel Pacing Threshold Pulse Width: 0.5 ms
Lead Channel Sensing Intrinsic Amplitude: 8.4 mV
Lead Channel Setting Pacing Amplitude: 3.25 V
Lead Channel Setting Pacing Pulse Width: 1 ms
MDC IDC LEAD IMPLANT DT: 20131017
MDC IDC LEAD IMPLANT DT: 20131017
MDC IDC LEAD LOCATION: 753858
MDC IDC LEAD LOCATION: 753859
MDC IDC LEAD LOCATION: 753860
MDC IDC MSMT LEADCHNL RA IMPEDANCE VALUE: 400 Ohm
MDC IDC MSMT LEADCHNL RA PACING THRESHOLD PULSEWIDTH: 0.5 ms
MDC IDC MSMT LEADCHNL RA SENSING INTR AMPL: 3 mV
MDC IDC PG SERIAL: 7063327
MDC IDC SESS DTM: 20161107070014
MDC IDC SET LEADCHNL RA PACING AMPLITUDE: 2 V
MDC IDC SET LEADCHNL RV PACING AMPLITUDE: 2 V
MDC IDC SET LEADCHNL RV PACING PULSEWIDTH: 0.5 ms
MDC IDC SET LEADCHNL RV SENSING SENSITIVITY: 0.5 mV
MDC IDC STAT BRADY AP VS PERCENT: 1 %
MDC IDC STAT BRADY AS VP PERCENT: 5.1 %
MDC IDC STAT BRADY RA PERCENT PACED: 94 %

## 2014-11-23 NOTE — Progress Notes (Signed)
Remote ICD transmission.   

## 2014-11-24 ENCOUNTER — Encounter: Payer: Self-pay | Admitting: Cardiology

## 2014-12-25 ENCOUNTER — Telehealth: Payer: Self-pay | Admitting: Internal Medicine

## 2014-12-25 MED ORDER — FLUOCINONIDE 0.05 % EX CREA: 1.0000 "application " | TOPICAL_CREAM | Freq: Two times a day (BID) | CUTANEOUS | Status: DC | PRN

## 2014-12-25 NOTE — Telephone Encounter (Signed)
Needs fluoc cream

## 2015-01-20 ENCOUNTER — Telehealth: Payer: Self-pay | Admitting: Internal Medicine

## 2015-01-20 NOTE — Telephone Encounter (Signed)
Pt called requesting assistant with getting Eliquis 5 mg tablet. I explained to the pt that Dr. Alain Marion with [facility], would have to help her with that because he was the one following her Eliquis and that she needed to contact his office. Pt stated that she would contact them to request assistants with getting this medication. I advised the pt that if she has any other problems, questions or concerns to call the office. Pt verbalized understanding.

## 2015-01-20 NOTE — Telephone Encounter (Signed)
Pt can check if Xarelto or Pradaxa is covered better Thx

## 2015-01-20 NOTE — Telephone Encounter (Signed)
Pt called stating her prescription for ELIQUIS 5 MG TABS tablet [id number] is now too expensive to take.  She's wondering if there is something else she can take. She states she is allergic to warfarin.  She is very concerned about this. Can you please call her at [phone]

## 2015-02-02 ENCOUNTER — Telehealth: Payer: Self-pay | Admitting: Internal Medicine

## 2015-02-02 NOTE — Telephone Encounter (Signed)
patient states that she has a tooth extraction scheduled for 02/10/2015. She asks how far in advance the should stop taking her eliquis and when to start it back. Please advise patient.

## 2015-02-05 NOTE — Telephone Encounter (Signed)
Skip Eliquis the night before. Re-start on the night of 1/25 if no bleeding Thx

## 2015-02-05 NOTE — Telephone Encounter (Signed)
Pt informed

## 2015-02-17 ENCOUNTER — Telehealth: Payer: Self-pay | Admitting: Internal Medicine

## 2015-02-17 NOTE — Telephone Encounter (Signed)
I called and spoke with the patient and gave her the alternative names of medications to Eliquis 1) Pradaxa 150 mg BID 2) Xarelto 20 mg QD  I asked her to please check with her pharmacy to see which might be better, cost wise, for her.  I have also pulled Eliquis samples for her Lot: [id number] Exp: 5/19 # 3 boxes.  She is aware these will be at the front desk for her.

## 2015-02-17 NOTE — Telephone Encounter (Signed)
New problem    Pt need to speak to nurse concerning getting a cheaper blood thinner. Please call.

## 2015-03-12 ENCOUNTER — Other Ambulatory Visit: Payer: Self-pay | Admitting: *Deleted

## 2015-03-12 MED ORDER — BISOPROLOL FUMARATE 5 MG PO TABS: 5.0000 mg | ORAL_TABLET | Freq: Every day | ORAL | Status: DC

## 2015-03-12 MED ORDER — ATORVASTATIN CALCIUM 40 MG PO TABS: 40.0000 mg | ORAL_TABLET | Freq: Every day | ORAL | Status: DC

## 2015-03-17 ENCOUNTER — Telehealth: Payer: Self-pay | Admitting: *Deleted

## 2015-03-17 MED ORDER — ATORVASTATIN CALCIUM 40 MG PO TABS: 40.0000 mg | ORAL_TABLET | Freq: Every day | ORAL | Status: DC

## 2015-03-17 MED ORDER — BISOPROLOL FUMARATE 5 MG PO TABS: 5.0000 mg | ORAL_TABLET | Freq: Every day | ORAL | Status: DC

## 2015-03-17 MED ORDER — POTASSIUM CHLORIDE CRYS ER 20 MEQ PO TBCR: 20.0000 meq | EXTENDED_RELEASE_TABLET | Freq: Every day | ORAL | Status: DC

## 2015-03-17 MED ORDER — FUROSEMIDE 40 MG PO TABS: 40.0000 mg | ORAL_TABLET | Freq: Every day | ORAL | Status: DC

## 2015-03-17 MED ORDER — VALSARTAN 80 MG PO TABS: 80.0000 mg | ORAL_TABLET | Freq: Every day | ORAL | Status: DC

## 2015-03-17 NOTE — Telephone Encounter (Signed)
Received call pt states [employer] home delivery stated that they have not received prescriptions from MD. Inform pt per chart MD sent Bisoprolol & Lipitor on 03/12/15. Will resend...Alicia Guerrero

## 2015-04-26 ENCOUNTER — Ambulatory Visit (INDEPENDENT_AMBULATORY_CARE_PROVIDER_SITE_OTHER): Payer: Medicare HMO | Admitting: Internal Medicine

## 2015-04-26 ENCOUNTER — Encounter: Payer: Self-pay | Admitting: Internal Medicine

## 2015-04-26 VITALS — BP 160/91 | HR 84 | Ht 63.0 in | Wt 175.2 lb

## 2015-04-26 DIAGNOSIS — Z95810 Presence of automatic (implantable) cardiac defibrillator: Secondary | ICD-10-CM | POA: Diagnosis not present

## 2015-04-26 DIAGNOSIS — I5022 Chronic systolic (congestive) heart failure: Secondary | ICD-10-CM | POA: Diagnosis not present

## 2015-04-26 DIAGNOSIS — I255 Ischemic cardiomyopathy: Secondary | ICD-10-CM | POA: Diagnosis not present

## 2015-04-26 DIAGNOSIS — I4901 Ventricular fibrillation: Secondary | ICD-10-CM | POA: Diagnosis not present

## 2015-04-26 LAB — CUP PACEART INCLINIC DEVICE CHECK
Battery Remaining Longevity: 18
HighPow Impedance: 79.875
Implantable Lead Implant Date: 20131017
Implantable Lead Implant Date: 20131017
Implantable Lead Implant Date: 20131017
Lead Channel Impedance Value: 362.5 Ohm
Lead Channel Impedance Value: 762.5 Ohm
Lead Channel Pacing Threshold Amplitude: 1 V
Lead Channel Pacing Threshold Pulse Width: 0.5 ms
Lead Channel Pacing Threshold Pulse Width: 0.5 ms
Lead Channel Pacing Threshold Pulse Width: 0.5 ms
Lead Channel Pacing Threshold Pulse Width: 1 ms
Lead Channel Sensing Intrinsic Amplitude: 11.3 mV
Lead Channel Setting Pacing Amplitude: 2 V
Lead Channel Setting Pacing Amplitude: 2 V
Lead Channel Setting Pacing Amplitude: 3.75 V
Lead Channel Setting Pacing Pulse Width: 0.5 ms
MDC IDC LEAD LOCATION: 753858
MDC IDC LEAD LOCATION: 753859
MDC IDC LEAD LOCATION: 753860
MDC IDC MSMT LEADCHNL LV PACING THRESHOLD AMPLITUDE: 3.25 V
MDC IDC MSMT LEADCHNL RA IMPEDANCE VALUE: 400 Ohm
MDC IDC MSMT LEADCHNL RA PACING THRESHOLD AMPLITUDE: 1 V
MDC IDC MSMT LEADCHNL RA SENSING INTR AMPL: 2.9 mV
MDC IDC MSMT LEADCHNL RV PACING THRESHOLD AMPLITUDE: 1 V
MDC IDC MSMT LEADCHNL RV PACING THRESHOLD AMPLITUDE: 1 V
MDC IDC MSMT LEADCHNL RV PACING THRESHOLD PULSEWIDTH: 0.5 ms
MDC IDC PG SERIAL: 7063327
MDC IDC SESS DTM: 20170410131429
MDC IDC SET LEADCHNL LV PACING PULSEWIDTH: 1 ms
MDC IDC SET LEADCHNL RV SENSING SENSITIVITY: 0.5 mV
MDC IDC STAT BRADY RA PERCENT PACED: 94 %
MDC IDC STAT BRADY RV PERCENT PACED: 99 %

## 2015-04-26 NOTE — Patient Instructions (Signed)
Medication Instructions: - Your physician recommends that you continue on your current medications as directed. Please refer to the Current Medication list given to you today.  Labwork: - none  Procedures/Testing: - none  Follow-Up: Remote monitoring is used to monitor your Pacemaker of ICD from home. This monitoring reduces the number of office visits required to check your device to one time per year. It allows [place] to keep an eye on the functioning of your device to ensure it is working properly. You are scheduled for a device check from home on 07/26/15. You may send your transmission at any time that day. If you have a wireless device, the transmission will be sent automatically. After your physician reviews your transmission, you will receive a postcard with your next transmission date.  - Your physician wants you to follow-up in: 1 year with Dr. Caryl Comes. You will receive a reminder letter in the mail two months in advance. If you don't receive a letter, please call our office to schedule the follow-up appointment.  Any Additional Special Instructions Will Be Listed Below (If Applicable).     If you need a refill on your cardiac medications before your next appointment, please call your pharmacy.

## 2015-04-26 NOTE — Progress Notes (Signed)
Patient Care Team: [name], MD as PCP - General (Internal Medicine) Collene Schlichter, [place] as Consulting Physician (Gastroenterology) [name], MD as Consulting Physician (Cardiology) Tanda Rockers, MD as Consulting Physician (Pulmonary Disease)   HPI  Alicia Guerrero is a 81 y.o. female Seen in followup for aborted cardiac arrest in the context of coronary artery disease and left bundle branch block. This occurred in the context of coronary artery disease. LHC at that time >>Stents patent in RCA/PDA, LAD, and D1. There is a 95% ostial stenosis of a moderate to large OM1. Looking at the prior cath, it is difficult to tell whether or not this was present before.  Because of persistent left ventricular dysfunction and no clear trigger for the event she successfully underwent CRT-D implantation. 10/13 by Dr. Amaryllis Dyke   She was hospitalized 11/14 for strokes x2 that occurred in the context of a supratherapeutic INR. It was elected to switch her to apixaban.  .   More recently she has been doing pretty well. She denies chest pain or shortness of breath. There has been no peripheral edema. Her blood pressures at home are in the 120 range.      Past Medical History  Diagnosis Date  . Left bundle branch block   . Atrial fibrillation ([place])   . CAD (coronary artery disease)     a. s/p stent to RCA 1996;  b. s/p cutting balloon PTCA to LAD 2001; c. s/p Cypher DES to LAD 2003;  d. NSTEMI 4/12: DES to dRCA;  e.  [place] 9/13 (after presentation with cardiac arrest and + Nz's for NSTEMI): pLAD 30% ISR, oD1 30%, dLAD 60-70%, pOM1 (small vessel) 90%, oOM2 95% (moderate to large vessel), mCFX 30%, pOM3 30-40%, dRCA stents and pPDA stents patent with 30% ISR => med Rx rec.   . Ischemic cardiomyopathy     a. EF 40% at cath 4/12 with AL and Inf HK;  b.  Echo after presentation with cardiac arrest 9/13:  EF 40-45% and severe inf HK to AK c/w infarction, PASP 42  . HLD  (hyperlipidemia)   . HTN (hypertension)   . Other atopic dermatitis and related conditions   . Allergic rhinitis due to pollen   . Asthma   . Other and unspecified ovarian cyst   . Personal history of malignant neoplasm of breast   . Personal history of hyperthyroidism   . Chronic eczema     hands  . Hemorrhoids   . Cancer ([place])   . Bradycardia     a. coreg tapered off 05/2011 => b. Metoprolol restarted after cardiac arrest 09/2011  . Cardiac arrest ([place]) 09/2011    a. VT/VF in community; resusc unsuccessful;  PEA in ED; multiple defibs => revived;  [employer];  c/b by VDRF, cardiogenic shock;  LHC with stable anatomy => med Rx;  difficult ween from vent => s/p trach;  d/c to SNF  . Chronic systolic heart failure ([place])   . Biventricular ICD (implantable cardiac defibrillator) in place     St Jude 10/2011 (implant MD: Allred)    Past Surgical History  Procedure Laterality Date  . Oophorectomy    . Abdominal hysterectomy    . Ptca    . Cholecystectomy    . Coronary stent placement  may and  july 2012  . Breast lumpectomy  08/24/2003    right lumpectomy+sln,T2N0,ERPR+,Her2-  . Polypectomy    . Colonoscopy    .  Video bronchoscopy  10/26/2011    Procedure: VIDEO BRONCHOSCOPY WITHOUT FLUORO;  Surgeon: Raylene Miyamoto, MD;  Location: [facility] ENDOSCOPY;  Service: Endoscopy;  Laterality: Bilateral;  . Left heart catheterization with coronary angiogram N/A 10/09/2011    Procedure: LEFT HEART CATHETERIZATION WITH CORONARY ANGIOGRAM;  Surgeon: Larey Dresser, MD;  Location: [facility] CATH LAB;  Service: Cardiovascular;  Laterality: N/A;  . Bi-ventricular implantable cardioverter defibrillator N/A 11/02/2011    Procedure: BI-VENTRICULAR IMPLANTABLE CARDIOVERTER DEFIBRILLATOR  (CRT-D);  Surgeon: Thompson Grayer, MD;  Location: [facility] CATH LAB;  Service: Cardiovascular;  Laterality: N/A;    Current Outpatient Prescriptions  Medication Sig Dispense Refill  . acetaminophen (TYLENOL) 325 MG tablet Take  650 mg by mouth every 6 (six) hours as needed. For pain    . albuterol (PROVENTIL) (2.5 MG/3ML) 0.083% nebulizer solution Take 3 mLs (2.5 mg total) by nebulization every 6 (six) hours as needed. 75 mL 5  . atorvastatin (LIPITOR) 40 MG tablet Take 1 tablet (40 mg total) by mouth daily. Yearly physical due in April must see md for refills 90 tablet 0  . bisoprolol (ZEBETA) 5 MG tablet Take 1 tablet (5 mg total) by mouth daily. 90 tablet 0  . cetirizine (ZYRTEC) 10 MG tablet Take 1 tablet (10 mg total) by mouth as needed for allergies. 90 tablet 3  . ELIQUIS 5 MG TABS tablet TAKE ONE TABLET BY MOUTH TWICE DAILY 60 tablet 11  . fluocinonide cream (LIDEX) 1.76 % Apply 1 application topically 2 (two) times daily as needed (skin). 60 g 3  . furosemide (LASIX) 40 MG tablet Take 1 tablet (40 mg total) by mouth daily. 90 tablet 0  . nitroGLYCERIN (NITROSTAT) 0.4 MG SL tablet Place 1 tablet (0.4 mg total) under the tongue every 5 (five) minutes x 3 doses as needed for chest pain. Chest pain 20 tablet 3  . potassium chloride SA (K-DUR,KLOR-CON) 20 MEQ tablet Take 1 tablet (20 mEq total) by mouth daily. Yearly physical due in April must see md for refills 90 tablet 0  . valsartan (DIOVAN) 80 MG tablet Take 1 tablet (80 mg total) by mouth daily. Yearly physical due in April must see md for refills 90 tablet 0   No current facility-administered medications for this visit.    Allergies  Allergen Reactions  . Coreg [Carvedilol]     Severe wheezing   . [name] [Trandolapril]     cough  . Meperidine Hcl Swelling    Mixed with phenergan swelling of the mouth   . Penicillins Other (See Comments)    Pt has taken penicillin about 6 years ago with no side effects  . Molds & Smuts Other (See Comments)    Runny nose  . Tape Other (See Comments)    Redness, pulls skin off  . Tetanus Toxoids Rash    Review of Systems negative except from HPI and PMH  Physical Exam BP 160/91 mmHg  Pulse 84  Ht 5' 3"  (1.6 m)   Wt 175 lb 3.2 oz (79.47 kg)  BMI 31.04 kg/m2 Well developed and nourished in no acute distress HENT normal Neck supple with JVP-6 Clear Device pocket well healed; without hematoma or erythema.  There is no tethering Regular rate and rhythm, no murmurs or gallops Abd-soft with active BS No Clubbing cyanosis edema Skin-warm and dry A & Oriented  Grossly normal sensory and motor function   ECG demonstrates AV pacing with  upright QRS in lead V1 negative lead 1  Assessment and  Plan  Atrial fibrillation No intercurrent atrial fibrillation or flutter    Heart Failure-chronic systolic  Hypertension  Ventricular Tachycardia/fibrillation No intercurrent Ventricular tachycardia  Sinus Bradycardia  LV lead threshold-high  Biventricular ICD St Jude The patient's device was interrogated.  The information was reviewed.  Programming of the LV lead is limited to tip-coil ; in other vectors  she either has no capture or diaphragmatic stimulation. Her functional status is stable.  Blood pressures at home are in acceptable range  Euvolemic continue current meds   .

## 2015-05-14 ENCOUNTER — Ambulatory Visit (INDEPENDENT_AMBULATORY_CARE_PROVIDER_SITE_OTHER): Payer: Medicare HMO | Admitting: Internal Medicine

## 2015-05-14 ENCOUNTER — Encounter: Payer: Self-pay | Admitting: Internal Medicine

## 2015-05-14 VITALS — BP 140/72 | HR 69 | Ht 63.0 in | Wt 177.0 lb

## 2015-05-14 DIAGNOSIS — I5022 Chronic systolic (congestive) heart failure: Secondary | ICD-10-CM

## 2015-05-14 DIAGNOSIS — E119 Type 2 diabetes mellitus without complications: Secondary | ICD-10-CM | POA: Insufficient documentation

## 2015-05-14 DIAGNOSIS — E1121 Type 2 diabetes mellitus with diabetic nephropathy: Secondary | ICD-10-CM

## 2015-05-14 DIAGNOSIS — L309 Dermatitis, unspecified: Secondary | ICD-10-CM | POA: Diagnosis not present

## 2015-05-14 DIAGNOSIS — Z0000 Encounter for general adult medical examination without abnormal findings: Secondary | ICD-10-CM

## 2015-05-14 DIAGNOSIS — I480 Paroxysmal atrial fibrillation: Secondary | ICD-10-CM

## 2015-05-14 DIAGNOSIS — B372 Candidiasis of skin and nail: Secondary | ICD-10-CM

## 2015-05-14 DIAGNOSIS — E782 Mixed hyperlipidemia: Secondary | ICD-10-CM

## 2015-05-14 DIAGNOSIS — I10 Essential (primary) hypertension: Secondary | ICD-10-CM

## 2015-05-14 MED ORDER — ATORVASTATIN CALCIUM 40 MG PO TABS: 40.0000 mg | ORAL_TABLET | Freq: Every day | ORAL | Status: DC

## 2015-05-14 MED ORDER — KETOCONAZOLE 2 % EX CREA: 1.0000 "application " | TOPICAL_CREAM | Freq: Every day | CUTANEOUS | Status: DC

## 2015-05-14 MED ORDER — APIXABAN 5 MG PO TABS: 5.0000 mg | ORAL_TABLET | Freq: Two times a day (BID) | ORAL | Status: DC

## 2015-05-14 MED ORDER — VALSARTAN 80 MG PO TABS: 80.0000 mg | ORAL_TABLET | Freq: Every day | ORAL | Status: DC

## 2015-05-14 MED ORDER — POTASSIUM CHLORIDE CRYS ER 20 MEQ PO TBCR: 20.0000 meq | EXTENDED_RELEASE_TABLET | Freq: Every day | ORAL | Status: DC

## 2015-05-14 MED ORDER — BISOPROLOL FUMARATE 5 MG PO TABS
5.0000 mg | ORAL_TABLET | Freq: Every day | ORAL | Status: DC
Start: 2015-05-14 — End: 2016-05-19

## 2015-05-14 MED ORDER — TRIAMCINOLONE ACETONIDE 0.5 % EX OINT: 1.0000 "application " | TOPICAL_OINTMENT | Freq: Two times a day (BID) | CUTANEOUS | Status: DC

## 2015-05-14 MED ORDER — FUROSEMIDE 40 MG PO TABS: 40.0000 mg | ORAL_TABLET | Freq: Every day | ORAL | Status: DC

## 2015-05-14 NOTE — Progress Notes (Signed)
Subjective:  Patient ID: Alicia Guerrero, female    DOB: [birth year]  Age: 80 y.o. MRN: [id number]  CC: No chief complaint on file.   HPI Alicia Guerrero presents for a well exam C/o SBP150-170 C/o allergies, rash under the breasts - off and on; not better w/baby powder   Outpatient Prescriptions Prior to Visit  Medication Sig Dispense Refill  . acetaminophen (TYLENOL) 325 MG tablet Take 650 mg by mouth every 6 (six) hours as needed. For pain    . albuterol (PROVENTIL) (2.5 MG/3ML) 0.083% nebulizer solution Take 3 mLs (2.5 mg total) by nebulization every 6 (six) hours as needed. 75 mL 5  . atorvastatin (LIPITOR) 40 MG tablet Take 1 tablet (40 mg total) by mouth daily. Yearly physical due in April must see md for refills 90 tablet 0  . bisoprolol (ZEBETA) 5 MG tablet Take 1 tablet (5 mg total) by mouth daily. 90 tablet 0  . cetirizine (ZYRTEC) 10 MG tablet Take 1 tablet (10 mg total) by mouth as needed for allergies. 90 tablet 3  . fluocinonide cream (LIDEX) [id number] % Apply 1 application topically 2 (two) times daily as needed (skin). 60 g 3  . furosemide (LASIX) 40 MG tablet Take 1 tablet (40 mg total) by mouth daily. 90 tablet 0  . nitroGLYCERIN (NITROSTAT) 0.4 MG SL tablet Place 1 tablet (0.4 mg total) under the tongue every 5 (five) minutes x 3 doses as needed for chest pain. Chest pain 20 tablet 3  . potassium chloride SA (K-DUR,KLOR-CON) 20 MEQ tablet Take 1 tablet (20 mEq total) by mouth daily. Yearly physical due in April must see md for refills 90 tablet 0  . ELIQUIS 5 MG TABS tablet TAKE ONE TABLET BY MOUTH TWICE DAILY 60 tablet 11  . valsartan (DIOVAN) 80 MG tablet Take 1 tablet (80 mg total) by mouth daily. Yearly physical due in April must see md for refills 90 tablet 0   No facility-administered medications prior to visit.    ROS Review of Systems  Constitutional: Negative for chills, activity change, appetite change, fatigue and unexpected weight change.  HENT: Negative  for congestion, mouth sores and sinus pressure.   Eyes: Negative for visual disturbance.  Respiratory: Negative for cough and chest tightness.   Gastrointestinal: Negative for nausea and abdominal pain.  Genitourinary: Negative for frequency, difficulty urinating and vaginal pain.  Musculoskeletal: Negative for back pain and gait problem.  Skin: Positive for rash. Negative for pallor.  Neurological: Negative for dizziness, tremors, weakness, numbness and headaches.  Psychiatric/Behavioral: Negative for suicidal ideas, confusion and sleep disturbance.    Objective:  BP 140/72 mmHg  Pulse 69  Ht 5\' 3"  (1.6 m)  Wt 177 lb (80.287 kg)  BMI 31.36 kg/m2  SpO2 94%  BP Readings from Last 3 Encounters:  05/14/15 140/72  04/26/15 160/91  05/14/14 130/72    Wt Readings from Last 3 Encounters:  05/14/15 177 lb (80.287 kg)  04/26/15 175 lb 3.2 oz (79.47 kg)  05/14/14 175 lb (79.379 kg)    Physical Exam  Constitutional: She appears well-developed. No distress.  HENT:  Head: Normocephalic.  Right Ear: External ear normal.  Left Ear: External ear normal.  Nose: Nose normal.  Mouth/Throat: Oropharynx is clear and moist.  Eyes: Conjunctivae are normal. Pupils are equal, round, and reactive to light. Right eye exhibits no discharge. Left eye exhibits no discharge.  Neck: Normal range of motion. Neck supple. No JVD present. No tracheal deviation present. No  thyromegaly present.  Cardiovascular: Normal rate, regular rhythm and normal heart sounds.   Pulmonary/Chest: No stridor. No respiratory distress. She has no wheezes.  Abdominal: Soft. Bowel sounds are normal. She exhibits no distension and no mass. There is no tenderness. There is no rebound and no guarding.  Musculoskeletal: She exhibits no edema or tenderness.  Lymphadenopathy:    She has no cervical adenopathy.  Neurological: She displays normal reflexes. No cranial nerve deficit. She exhibits normal muscle tone. Coordination normal.   Skin: Rash noted. No erythema.  Psychiatric: She has a normal mood and affect. Her behavior is normal. Judgment and thought content normal.  Obese RLE eczema patch B intertrigo  Lab Results  Component Value Date   WBC 7.7 08/15/2013   HGB 13.0 08/15/2013   HCT 39.8 08/15/2013   PLT 273.0 08/15/2013   GLUCOSE 151* 05/14/2014   CHOL 180 12/06/2012   TRIG 176* 12/06/2012   HDL 48 12/06/2012   LDLDIRECT 91.1 03/21/2010   LDLCALC 97 12/06/2012   ALT 19 05/14/2014   AST 21 05/14/2014   NA 139 05/14/2014   K 3.8 05/14/2014   CL 99 05/14/2014   CREATININE 1.00 05/14/2014   BUN 15 05/14/2014   CO2 32 05/14/2014   TSH 2.48 05/14/2014   INR 3.3 12/09/2012   HGBA1C 7.4* 05/14/2014    Dg Hip Unilat With Pelvis 2-3 Views Right  05/14/2014  CLINICAL DATA:  RIGHT hip pain for 1 and half weeks. History of arthritis. EXAM: RIGHT HIP (WITH PELVIS) 2-3 VIEWS COMPARISON:  None. FINDINGS: Panniculus projects over the hip. No fracture. The obturator rings appear within normal limits. There are no marginal osteophytes off the femoral head on the lateral view. There is probably some superior joint space narrowing on the weight-bearing frontal view. IMPRESSION: No acute osseous abnormality.  Question mild joint space narrowing. Electronically Signed   By: Dereck Ligas M.D.   On: 05/14/2014 09:28    Assessment & Plan:   There are no diagnoses linked to this encounter. I have changed Ms. Vanderschaaf's ELIQUIS to apixaban. I have also changed her valsartan. I am also having her maintain her acetaminophen, cetirizine, nitroGLYCERIN, albuterol, fluocinonide cream, atorvastatin, bisoprolol, furosemide, and potassium chloride SA.  Meds ordered this encounter  Medications  . valsartan (DIOVAN) 80 MG tablet    Sig: Take 1 tablet (80 mg total) by mouth daily.    Dispense:  30 tablet    Refill:  11  . apixaban (ELIQUIS) 5 MG TABS tablet    Sig: Take 1 tablet (5 mg total) by mouth 2 (two) times daily.     Dispense:  60 tablet    Refill:  11     Follow-up: No Follow-up on file.  Walker Kehr, MD

## 2015-05-14 NOTE — Progress Notes (Signed)
Pre visit review using our clinic review tool, if applicable. No additional management support is needed unless otherwise documented below in the visit note. 

## 2015-05-14 NOTE — Assessment & Plan Note (Signed)
Labs

## 2015-05-14 NOTE — Assessment & Plan Note (Signed)
R leg Kenalog cream

## 2015-05-14 NOTE — Assessment & Plan Note (Signed)
On Valsartan, Bisoprolol 

## 2015-05-14 NOTE — Patient Instructions (Signed)
Get athletic bra (ventilating and moisture wicking)   Preventive Care for Adults, Female A healthy lifestyle and preventive care can promote health and wellness. Preventive health guidelines for women include the following key practices.  A routine yearly physical is a good way to check with your health care provider about your health and preventive screening. It is a chance to share any concerns and updates on your health and to receive a thorough exam.  Visit your dentist for a routine exam and preventive care every 6 months. Brush your teeth twice a day and floss once a day. Good oral hygiene prevents tooth decay and gum disease.  The frequency of eye exams is based on your age, health, family medical history, use of contact lenses, and other factors. Follow your health care provider's recommendations for frequency of eye exams.  Eat a healthy diet. Foods like vegetables, fruits, whole grains, low-fat dairy products, and lean protein foods contain the nutrients you need without too many calories. Decrease your intake of foods high in solid fats, added sugars, and salt. Eat the right amount of calories for you.Get information about a proper diet from your health care provider, if necessary.  Regular physical exercise is one of the most important things you can do for your health. Most adults should get at least 150 minutes of moderate-intensity exercise (any activity that increases your heart rate and causes you to sweat) each week. In addition, most adults need muscle-strengthening exercises on 2 or more days a week.  Maintain a healthy weight. The body mass index (BMI) is a screening tool to identify possible weight problems. It provides an estimate of body fat based on height and weight. Your health care provider can find your BMI and can help you achieve or maintain a healthy weight.For adults 20 years and older:  A BMI below 18.5 is considered underweight.  A BMI of 18.5 to 24.9 is  normal.  A BMI of 25 to 29.9 is considered overweight.  A BMI of 30 and above is considered obese.  Maintain normal blood lipids and cholesterol levels by exercising and minimizing your intake of saturated fat. Eat a balanced diet with plenty of fruit and vegetables. Blood tests for lipids and cholesterol should begin at age 48 and be repeated every 5 years. If your lipid or cholesterol levels are high, you are over 50, or you are at high risk for heart disease, you may need your cholesterol levels checked more frequently.Ongoing high lipid and cholesterol levels should be treated with medicines if diet and exercise are not working.  If you smoke, find out from your health care provider how to quit. If you do not use tobacco, do not start.  Lung cancer screening is recommended for adults aged 57-80 years who are at high risk for developing lung cancer because of a history of smoking. A yearly low-dose CT scan of the lungs is recommended for people who have at least a 30-pack-year history of smoking and are a current smoker or have quit within the past 15 years. A pack year of smoking is smoking an average of 1 pack of cigarettes a day for 1 year (for example: 1 pack a day for 30 years or 2 packs a day for 15 years). Yearly screening should continue until the smoker has stopped smoking for at least 15 years. Yearly screening should be stopped for people who develop a health problem that would prevent them from having lung cancer treatment.  If you  are pregnant, do not drink alcohol. If you are breastfeeding, be very cautious about drinking alcohol. If you are not pregnant and choose to drink alcohol, do not have more than 1 drink per day. One drink is considered to be 12 ounces (355 mL) of beer, 5 ounces (148 mL) of wine, or 1.5 ounces (44 mL) of liquor.  Avoid use of street drugs. Do not share needles with anyone. Ask for help if you need support or instructions about stopping the use of  drugs.  High blood pressure causes heart disease and increases the risk of stroke. Your blood pressure should be checked at least every 1 to 2 years. Ongoing high blood pressure should be treated with medicines if weight loss and exercise do not work.  If you are 48-70 years old, ask your health care provider if you should take aspirin to prevent strokes.  Diabetes screening is done by taking a blood sample to check your blood glucose level after you have not eaten for a certain period of time (fasting). If you are not overweight and you do not have risk factors for diabetes, you should be screened once every 3 years starting at age 59. If you are overweight or obese and you are 98-49 years of age, you should be screened for diabetes every year as part of your cardiovascular risk assessment.  Breast cancer screening is essential preventive care for women. You should practice "breast self-awareness." This means understanding the normal appearance and feel of your breasts and may include breast self-examination. Any changes detected, no matter how small, should be reported to a health care provider. Women in their 28s and 30s should have a clinical breast exam (CBE) by a health care provider as part of a regular health exam every 1 to 3 years. After age 6, women should have a CBE every year. Starting at age 23, women should consider having a mammogram (breast X-ray test) every year. Women who have a family history of breast cancer should talk to their health care provider about genetic screening. Women at a high risk of breast cancer should talk to their health care providers about having an MRI and a mammogram every year.  Breast cancer gene (BRCA)-related cancer risk assessment is recommended for women who have family members with BRCA-related cancers. BRCA-related cancers include breast, ovarian, tubal, and peritoneal cancers. Having family members with these cancers may be associated with an increased  risk for harmful changes (mutations) in the breast cancer genes BRCA1 and BRCA2. Results of the assessment will determine the need for genetic counseling and BRCA1 and BRCA2 testing.  Your health care provider may recommend that you be screened regularly for cancer of the pelvic organs (ovaries, uterus, and vagina). This screening involves a pelvic examination, including checking for microscopic changes to the surface of your cervix (Pap test). You may be encouraged to have this screening done every 3 years, beginning at age 2.  For women ages 32-65, health care providers may recommend pelvic exams and Pap testing every 3 years, or they may recommend the Pap and pelvic exam, combined with testing for human papilloma virus (HPV), every 5 years. Some types of HPV increase your risk of cervical cancer. Testing for HPV may also be done on women of any age with unclear Pap test results.  Other health care providers may not recommend any screening for nonpregnant women who are considered low risk for pelvic cancer and who do not have symptoms. Ask your health care  provider if a screening pelvic exam is right for you.  If you have had past treatment for cervical cancer or a condition that could lead to cancer, you need Pap tests and screening for cancer for at least 20 years after your treatment. If Pap tests have been discontinued, your risk factors (such as having a new sexual partner) need to be reassessed to determine if screening should resume. Some women have medical problems that increase the chance of getting cervical cancer. In these cases, your health care provider may recommend more frequent screening and Pap tests.  Colorectal cancer can be detected and often prevented. Most routine colorectal cancer screening begins at the age of 86 years and continues through age 46 years. However, your health care provider may recommend screening at an earlier age if you have risk factors for colon cancer. On a  yearly basis, your health care provider may provide home test kits to check for hidden blood in the stool. Use of a small camera at the end of a tube, to directly examine the colon (sigmoidoscopy or colonoscopy), can detect the earliest forms of colorectal cancer. Talk to your health care provider about this at age 73, when routine screening begins. Direct exam of the colon should be repeated every 5-10 years through age 87 years, unless early forms of precancerous polyps or small growths are found.  People who are at an increased risk for hepatitis B should be screened for this virus. You are considered at high risk for hepatitis B if:  You were born in a country where hepatitis B occurs often. Talk with your health care provider about which countries are considered high risk.  Your parents were born in a high-risk country and you have not received a shot to protect against hepatitis B (hepatitis B vaccine).  You have HIV or AIDS.  You use needles to inject street drugs.  You live with, or have sex with, someone who has hepatitis B.  You get hemodialysis treatment.  You take certain medicines for conditions like cancer, organ transplantation, and autoimmune conditions.  Hepatitis C blood testing is recommended for all people born from [birth year] through 1965 and any individual with known risks for hepatitis C.  Practice safe sex. Use condoms and avoid high-risk sexual practices to reduce the spread of sexually transmitted infections (STIs). STIs include gonorrhea, chlamydia, syphilis, trichomonas, herpes, HPV, and human immunodeficiency virus (HIV). Herpes, HIV, and HPV are viral illnesses that have no cure. They can result in disability, cancer, and death.  You should be screened for sexually transmitted illnesses (STIs) including gonorrhea and chlamydia if:  You are sexually active and are younger than 24 years.  You are older than 24 years and your health care provider tells you that you are  at risk for this type of infection.  Your sexual activity has changed since you were last screened and you are at an increased risk for chlamydia or gonorrhea. Ask your health care provider if you are at risk.  If you are at risk of being infected with HIV, it is recommended that you take a prescription medicine daily to prevent HIV infection. This is called preexposure prophylaxis (PrEP). You are considered at risk if:  You are sexually active and do not regularly use condoms or know the HIV status of your partner(s).  You take drugs by injection.  You are sexually active with a partner who has HIV.  Talk with your health care provider about whether you are  at high risk of being infected with HIV. If you choose to begin PrEP, you should first be tested for HIV. You should then be tested every 3 months for as long as you are taking PrEP.  Osteoporosis is a disease in which the bones lose minerals and strength with aging. This can result in serious bone fractures or breaks. The risk of osteoporosis can be identified using a bone density scan. Women ages 103 years and over and women at risk for fractures or osteoporosis should discuss screening with their health care providers. Ask your health care provider whether you should take a calcium supplement or vitamin D to reduce the rate of osteoporosis.  Menopause can be associated with physical symptoms and risks. Hormone replacement therapy is available to decrease symptoms and risks. You should talk to your health care provider about whether hormone replacement therapy is right for you.  Use sunscreen. Apply sunscreen liberally and repeatedly throughout the day. You should seek shade when your shadow is shorter than you. Protect yourself by wearing long sleeves, pants, a wide-brimmed hat, and sunglasses year round, whenever you are outdoors.  Once a month, do a whole body skin exam, using a mirror to look at the skin on your back. Tell your health  care provider of new moles, moles that have irregular borders, moles that are larger than a pencil eraser, or moles that have changed in shape or color.  Stay current with required vaccines (immunizations).  Influenza vaccine. All adults should be immunized every year.  Tetanus, diphtheria, and acellular pertussis (Td, Tdap) vaccine. Pregnant women should receive 1 dose of Tdap vaccine during each pregnancy. The dose should be obtained regardless of the length of time since the last dose. Immunization is preferred during the 27th-36th week of gestation. An adult who has not previously received Tdap or who does not know her vaccine status should receive 1 dose of Tdap. This initial dose should be followed by tetanus and diphtheria toxoids (Td) booster doses every 10 years. Adults with an unknown or incomplete history of completing a 3-dose immunization series with Td-containing vaccines should begin or complete a primary immunization series including a Tdap dose. Adults should receive a Td booster every 10 years.  Varicella vaccine. An adult without evidence of immunity to varicella should receive 2 doses or a second dose if she has previously received 1 dose. Pregnant females who do not have evidence of immunity should receive the first dose after pregnancy. This first dose should be obtained before leaving the health care facility. The second dose should be obtained 4-8 weeks after the first dose.  Human papillomavirus (HPV) vaccine. Females aged 13-26 years who have not received the vaccine previously should obtain the 3-dose series. The vaccine is not recommended for use in pregnant females. However, pregnancy testing is not needed before receiving a dose. If a female is found to be pregnant after receiving a dose, no treatment is needed. In that case, the remaining doses should be delayed until after the pregnancy. Immunization is recommended for any person with an immunocompromised condition through  the age of 81 years if she did not get any or all doses earlier. During the 3-dose series, the second dose should be obtained 4-8 weeks after the first dose. The third dose should be obtained 24 weeks after the first dose and 16 weeks after the second dose.  Zoster vaccine. One dose is recommended for adults aged 21 years or older unless certain conditions are present.  Measles, mumps, and rubella (MMR) vaccine. Adults born before [birth year] generally are considered immune to measles and mumps. Adults born in [birth year] or later should have 1 or more doses of MMR vaccine unless there is a contraindication to the vaccine or there is laboratory evidence of immunity to each of the three diseases. A routine second dose of MMR vaccine should be obtained at least 28 days after the first dose for students attending postsecondary schools, health care workers, or international travelers. People who received inactivated measles vaccine or an unknown type of measles vaccine during 1963-1967 should receive 2 doses of MMR vaccine. People who received inactivated mumps vaccine or an unknown type of mumps vaccine before 1979 and are at high risk for mumps infection should consider immunization with 2 doses of MMR vaccine. For females of childbearing age, rubella immunity should be determined. If there is no evidence of immunity, females who are not pregnant should be vaccinated. If there is no evidence of immunity, females who are pregnant should delay immunization until after pregnancy. Unvaccinated health care workers born before [birth year] who lack laboratory evidence of measles, mumps, or rubella immunity or laboratory confirmation of disease should consider measles and mumps immunization with 2 doses of MMR vaccine or rubella immunization with 1 dose of MMR vaccine.  Pneumococcal 13-valent conjugate (PCV13) vaccine. When indicated, a person who is uncertain of his immunization history and has no record of immunization should receive the  PCV13 vaccine. All adults 53 years of age and older should receive this vaccine. An adult aged 37 years or older who has certain medical conditions and has not been previously immunized should receive 1 dose of PCV13 vaccine. This PCV13 should be followed with a dose of pneumococcal polysaccharide (PPSV23) vaccine. Adults who are at high risk for pneumococcal disease should obtain the PPSV23 vaccine at least 8 weeks after the dose of PCV13 vaccine. Adults older than 78 years of age who have normal immune system function should obtain the PPSV23 vaccine dose at least 1 year after the dose of PCV13 vaccine.  Pneumococcal polysaccharide (PPSV23) vaccine. When PCV13 is also indicated, PCV13 should be obtained first. All adults aged 54 years and older should be immunized. An adult younger than age 65 years who has certain medical conditions should be immunized. Any person who resides in a nursing home or long-term care facility should be immunized. An adult smoker should be immunized. People with an immunocompromised condition and certain other conditions should receive both PCV13 and PPSV23 vaccines. People with human immunodeficiency virus (HIV) infection should be immunized as soon as possible after diagnosis. Immunization during chemotherapy or radiation therapy should be avoided. Routine use of PPSV23 vaccine is not recommended for American Indians, [place] Natives, or people younger than 65 years unless there are medical conditions that require PPSV23 vaccine. When indicated, people who have unknown immunization and have no record of immunization should receive PPSV23 vaccine. One-time revaccination 5 years after the first dose of PPSV23 is recommended for people aged 19-64 years who have chronic kidney failure, nephrotic syndrome, asplenia, or immunocompromised conditions. People who received 1-2 doses of PPSV23 before age 40 years should receive another dose of PPSV23 vaccine at age 64 years or later if at least  5 years have passed since the previous dose. Doses of PPSV23 are not needed for people immunized with PPSV23 at or after age 102 years.  Meningococcal vaccine. Adults with asplenia or persistent complement component deficiencies should receive 2 doses of quadrivalent meningococcal  conjugate (MenACWY-D) vaccine. The doses should be obtained at least 2 months apart. Microbiologists working with certain meningococcal bacteria, [employer] recruits, people at risk during an outbreak, and people who travel to or live in countries with a high rate of meningitis should be immunized. A first-year college student up through age 65 years who is living in a residence hall should receive a dose if she did not receive a dose on or after her 16th birthday. Adults who have certain high-risk conditions should receive one or more doses of vaccine.  Hepatitis A vaccine. Adults who wish to be protected from this disease, have certain high-risk conditions, work with hepatitis A-infected animals, work in hepatitis A research labs, or travel to or work in countries with a high rate of hepatitis A should be immunized. Adults who were previously unvaccinated and who anticipate close contact with an international adoptee during the first 60 days after arrival in the [place] from a country with a high rate of hepatitis A should be immunized.  Hepatitis B vaccine. Adults who wish to be protected from this disease, have certain high-risk conditions, may be exposed to blood or other infectious body fluids, are household contacts or sex partners of hepatitis B positive people, are clients or workers in certain care facilities, or travel to or work in countries with a high rate of hepatitis B should be immunized.  Haemophilus influenzae type b (Hib) vaccine. A previously unvaccinated person with asplenia or sickle cell disease or having a scheduled splenectomy should receive 1 dose of Hib vaccine. Regardless of previous immunization, a  recipient of a hematopoietic stem cell transplant should receive a 3-dose series 6-12 months after her successful transplant. Hib vaccine is not recommended for adults with HIV infection. Preventive Services / Frequency Ages 85 to 67 years  Blood pressure check.** / Every 3-5 years.  Lipid and cholesterol check.** / Every 5 years beginning at age 77.  Clinical breast exam.** / Every 3 years for women in their 38s and 44s.  BRCA-related cancer risk assessment.** / For women who have family members with a BRCA-related cancer (breast, ovarian, tubal, or peritoneal cancers).  Pap test.** / Every 2 years from ages 60 through 5. Every 3 years starting at age 37 through age 53 or 71 with a history of 3 consecutive normal Pap tests.  HPV screening.** / Every 3 years from ages 66 through ages 77 to 38 with a history of 3 consecutive normal Pap tests.  Hepatitis C blood test.** / For any individual with known risks for hepatitis C.  Skin self-exam. / Monthly.  Influenza vaccine. / Every year.  Tetanus, diphtheria, and acellular pertussis (Tdap, Td) vaccine.** / Consult your health care provider. Pregnant women should receive 1 dose of Tdap vaccine during each pregnancy. 1 dose of Td every 10 years.  Varicella vaccine.** / Consult your health care provider. Pregnant females who do not have evidence of immunity should receive the first dose after pregnancy.  HPV vaccine. / 3 doses over 6 months, if 75 and younger. The vaccine is not recommended for use in pregnant females. However, pregnancy testing is not needed before receiving a dose.  Measles, mumps, rubella (MMR) vaccine.** / You need at least 1 dose of MMR if you were born in 1957 or later. You may also need a 2nd dose. For females of childbearing age, rubella immunity should be determined. If there is no evidence of immunity, females who are not pregnant should be vaccinated. If there  is no evidence of immunity, females who are pregnant  should delay immunization until after pregnancy.  Pneumococcal 13-valent conjugate (PCV13) vaccine.** / Consult your health care provider.  Pneumococcal polysaccharide (PPSV23) vaccine.** / 1 to 2 doses if you smoke cigarettes or if you have certain conditions.  Meningococcal vaccine.** / 1 dose if you are age 42 to 34 years and a Market researcher living in a residence hall, or have one of several medical conditions, you need to get vaccinated against meningococcal disease. You may also need additional booster doses.  Hepatitis A vaccine.** / Consult your health care provider.  Hepatitis B vaccine.** / Consult your health care provider.  Haemophilus influenzae type b (Hib) vaccine.** / Consult your health care provider. Ages 59 to 89 years  Blood pressure check.** / Every year.  Lipid and cholesterol check.** / Every 5 years beginning at age 45 years.  Lung cancer screening. / Every year if you are aged 4-80 years and have a 30-pack-year history of smoking and currently smoke or have quit within the past 15 years. Yearly screening is stopped once you have quit smoking for at least 15 years or develop a health problem that would prevent you from having lung cancer treatment.  Clinical breast exam.** / Every year after age 25 years.  BRCA-related cancer risk assessment.** / For women who have family members with a BRCA-related cancer (breast, ovarian, tubal, or peritoneal cancers).  Mammogram.** / Every year beginning at age 58 years and continuing for as long as you are in good health. Consult with your health care provider.  Pap test.** / Every 3 years starting at age 51 years through age 33 or 56 years with a history of 3 consecutive normal Pap tests.  HPV screening.** / Every 3 years from ages 36 years through ages 34 to 70 years with a history of 3 consecutive normal Pap tests.  Fecal occult blood test (FOBT) of stool. / Every year beginning at age 64 years and  continuing until age 57 years. You may not need to do this test if you get a colonoscopy every 10 years.  Flexible sigmoidoscopy or colonoscopy.** / Every 5 years for a flexible sigmoidoscopy or every 10 years for a colonoscopy beginning at age 21 years and continuing until age 31 years.  Hepatitis C blood test.** / For all people born from [birth year] through 1965 and any individual with known risks for hepatitis C.  Skin self-exam. / Monthly.  Influenza vaccine. / Every year.  Tetanus, diphtheria, and acellular pertussis (Tdap/Td) vaccine.** / Consult your health care provider. Pregnant women should receive 1 dose of Tdap vaccine during each pregnancy. 1 dose of Td every 10 years.  Varicella vaccine.** / Consult your health care provider. Pregnant females who do not have evidence of immunity should receive the first dose after pregnancy.  Zoster vaccine.** / 1 dose for adults aged 40 years or older.  Measles, mumps, rubella (MMR) vaccine.** / You need at least 1 dose of MMR if you were born in 1957 or later. You may also need a second dose. For females of childbearing age, rubella immunity should be determined. If there is no evidence of immunity, females who are not pregnant should be vaccinated. If there is no evidence of immunity, females who are pregnant should delay immunization until after pregnancy.  Pneumococcal 13-valent conjugate (PCV13) vaccine.** / Consult your health care provider.  Pneumococcal polysaccharide (PPSV23) vaccine.** / 1 to 2 doses if you smoke cigarettes or if  you have certain conditions.  Meningococcal vaccine.** / Consult your health care provider.  Hepatitis A vaccine.** / Consult your health care provider.  Hepatitis B vaccine.** / Consult your health care provider.  Haemophilus influenzae type b (Hib) vaccine.** / Consult your health care provider. Ages 33 years and over  Blood pressure check.** / Every year.  Lipid and cholesterol check.** / Every 5 years  beginning at age 21 years.  Lung cancer screening. / Every year if you are aged 33-80 years and have a 30-pack-year history of smoking and currently smoke or have quit within the past 15 years. Yearly screening is stopped once you have quit smoking for at least 15 years or develop a health problem that would prevent you from having lung cancer treatment.  Clinical breast exam.** / Every year after age 79 years.  BRCA-related cancer risk assessment.** / For women who have family members with a BRCA-related cancer (breast, ovarian, tubal, or peritoneal cancers).  Mammogram.** / Every year beginning at age 69 years and continuing for as long as you are in good health. Consult with your health care provider.  Pap test.** / Every 3 years starting at age 85 years through age 3 or 22 years with 3 consecutive normal Pap tests. Testing can be stopped between 65 and 70 years with 3 consecutive normal Pap tests and no abnormal Pap or HPV tests in the past 10 years.  HPV screening.** / Every 3 years from ages 48 years through ages 67 or 40 years with a history of 3 consecutive normal Pap tests. Testing can be stopped between 65 and 70 years with 3 consecutive normal Pap tests and no abnormal Pap or HPV tests in the past 10 years.  Fecal occult blood test (FOBT) of stool. / Every year beginning at age 33 years and continuing until age 88 years. You may not need to do this test if you get a colonoscopy every 10 years.  Flexible sigmoidoscopy or colonoscopy.** / Every 5 years for a flexible sigmoidoscopy or every 10 years for a colonoscopy beginning at age 56 years and continuing until age 87 years.  Hepatitis C blood test.** / For all people born from [birth year] through 1963 and any individual with known risks for hepatitis C.  Osteoporosis screening.** / A one-time screening for women ages 51 years and over and women at risk for fractures or osteoporosis.  Skin self-exam. / Monthly.  Influenza vaccine. /  Every year.  Tetanus, diphtheria, and acellular pertussis (Tdap/Td) vaccine.** / 1 dose of Td every 10 years.  Varicella vaccine.** / Consult your health care provider.  Zoster vaccine.** / 1 dose for adults aged 9 years or older.  Pneumococcal 13-valent conjugate (PCV13) vaccine.** / Consult your health care provider.  Pneumococcal polysaccharide (PPSV23) vaccine.** / 1 dose for all adults aged 36 years and older.  Meningococcal vaccine.** / Consult your health care provider.  Hepatitis A vaccine.** / Consult your health care provider.  Hepatitis B vaccine.** / Consult your health care provider.  Haemophilus influenzae type b (Hib) vaccine.** / Consult your health care provider. ** Family history and personal history of risk and conditions may change your health care provider's recommendations.   This information is not intended to replace advice given to you by your health care provider. Make sure you discuss any questions you have with your health care provider.   Document Released: 02/28/2001 Document Revised: 01/23/2014 Document Reviewed: 05/30/2010 Elsevier Interactive Patient Education [employer].

## 2015-05-14 NOTE — Assessment & Plan Note (Signed)
Valsartan, Zebeta, Furosemide 

## 2015-05-14 NOTE — Assessment & Plan Note (Signed)
Valsartan, Zebeta, Furosemide, [facility]

## 2015-05-14 NOTE — Assessment & Plan Note (Signed)
4/17 under the breasts Ketoconazole Rx

## 2015-05-14 NOTE — Assessment & Plan Note (Signed)
Here for medicare wellness/physical  Diet: heart healthy  Physical activity:  sedentary  Depression/mood screen: negative  Hearing: intact to whispered voice  Visual acuity: grossly normal, performs annual eye exam  ADLs: capable  Fall risk: low to none  Home safety: good  Cognitive evaluation: intact to orientation, naming, recall and repetition  EOL planning: adv directives, full code/ I agree  I have personally reviewed and have noted  1. The patient's medical, surgical and social history  2. Their use of alcohol, tobacco or illicit drugs  3. Their current medications and supplements  4. The patient's functional ability including ADL's, fall risks, home safety risks and hearing or visual impairment.  5. Diet and physical activities  6. Evidence for depression or mood disorders 7. The roster of all physicians providing medical care to patient - is listed in the Snapshot section of the chart and reviewed today.    Today patient counseled on age appropriate routine health concerns for screening and prevention, each reviewed and up to date or declined. Immunizations reviewed and up to date or declined. Labs ordered and reviewed. Risk factors for depression reviewed and negative. Hearing function and visual acuity are intact. ADLs screened and addressed as needed. Functional ability and level of safety reviewed and appropriate. Education, counseling and referrals performed based on assessed risks today. Patient provided with a copy of personalized plan for preventive services.     Colonoscopy 2013

## 2015-05-14 NOTE — Assessment & Plan Note (Signed)
[facility] 

## 2015-05-19 ENCOUNTER — Ambulatory Visit (INDEPENDENT_AMBULATORY_CARE_PROVIDER_SITE_OTHER): Payer: Medicare HMO

## 2015-05-19 ENCOUNTER — Other Ambulatory Visit (INDEPENDENT_AMBULATORY_CARE_PROVIDER_SITE_OTHER): Payer: Medicare HMO

## 2015-05-19 VITALS — BP 130/80 | Ht 63.0 in | Wt 177.2 lb

## 2015-05-19 DIAGNOSIS — Z0000 Encounter for general adult medical examination without abnormal findings: Secondary | ICD-10-CM

## 2015-05-19 DIAGNOSIS — I10 Essential (primary) hypertension: Secondary | ICD-10-CM | POA: Diagnosis not present

## 2015-05-19 DIAGNOSIS — E1121 Type 2 diabetes mellitus with diabetic nephropathy: Secondary | ICD-10-CM | POA: Diagnosis not present

## 2015-05-19 DIAGNOSIS — I5022 Chronic systolic (congestive) heart failure: Secondary | ICD-10-CM

## 2015-05-19 DIAGNOSIS — I480 Paroxysmal atrial fibrillation: Secondary | ICD-10-CM | POA: Diagnosis not present

## 2015-05-19 DIAGNOSIS — E782 Mixed hyperlipidemia: Secondary | ICD-10-CM

## 2015-05-19 LAB — HEPATIC FUNCTION PANEL
ALT: 15 U/L (ref 0–35)
AST: 17 U/L (ref 0–37)
Albumin: 4.4 g/dL (ref 3.5–5.2)
Alkaline Phosphatase: 79 U/L (ref 39–117)
BILIRUBIN DIRECT: 0.1 mg/dL (ref 0.0–0.3)
BILIRUBIN TOTAL: 0.8 mg/dL (ref 0.2–1.2)
TOTAL PROTEIN: 7.4 g/dL (ref 6.0–8.3)

## 2015-05-19 LAB — URINALYSIS, ROUTINE W REFLEX MICROSCOPIC
Bilirubin Urine: NEGATIVE
Hgb urine dipstick: NEGATIVE
NITRITE: NEGATIVE
SPECIFIC GRAVITY, URINE: 1.025 (ref 1.000–1.030)
Total Protein, Urine: NEGATIVE
Urine Glucose: NEGATIVE
Urobilinogen, UA: 0.2 (ref 0.0–1.0)
pH: 5.5 (ref 5.0–8.0)

## 2015-05-19 LAB — HEMOGLOBIN A1C: Hgb A1c MFr Bld: 8.1 % — ABNORMAL HIGH (ref 4.6–6.5)

## 2015-05-19 LAB — TSH: TSH: 2.78 u[IU]/mL (ref 0.35–4.50)

## 2015-05-19 LAB — BASIC METABOLIC PANEL
BUN: 16 mg/dL (ref 6–23)
CALCIUM: 9.6 mg/dL (ref 8.4–10.5)
CO2: 29 mEq/L (ref 19–32)
CREATININE: 1.13 mg/dL (ref 0.40–1.20)
Chloride: 103 mEq/L (ref 96–112)
GFR: 49.53 mL/min — AB (ref >60.00)
Glucose, Bld: 166 mg/dL — ABNORMAL HIGH (ref 70–99)
Potassium: 4.4 mEq/L (ref 3.5–5.1)
SODIUM: 142 meq/L (ref 135–145)

## 2015-05-19 LAB — LIPID PANEL
CHOLESTEROL: 183 mg/dL (ref 0–200)
HDL: 46.5 mg/dL (ref >39.00)
LDL CALC: 103 mg/dL — AB (ref 0–99)
NonHDL: 136.34
TRIGLYCERIDES: 166 mg/dL — AB (ref 0.0–149.0)
Total CHOL/HDL Ratio: 4
VLDL: 33.2 mg/dL (ref 0.0–40.0)

## 2015-05-19 LAB — CBC WITH DIFFERENTIAL/PLATELET
BASOS PCT: 0.8 % (ref 0.0–3.0)
Basophils Absolute: 0.1 10*3/uL (ref 0.0–0.1)
EOS PCT: 6.1 % — AB (ref 0.0–5.0)
Eosinophils Absolute: 0.5 10*3/uL (ref 0.0–0.7)
HCT: 41.7 % (ref 36.0–46.0)
HEMOGLOBIN: 13.8 g/dL (ref 12.0–15.0)
Lymphocytes Relative: 27.8 % (ref 12.0–46.0)
Lymphs Abs: 2.1 10*3/uL (ref 0.7–4.0)
MCHC: 33 g/dL (ref 30.0–36.0)
MCV: 81.8 fl (ref 78.0–100.0)
MONO ABS: 0.7 10*3/uL (ref 0.1–1.0)
MONOS PCT: 9.3 % (ref 3.0–12.0)
Neutro Abs: 4.2 10*3/uL (ref 1.4–7.7)
Neutrophils Relative %: 56 % (ref 43.0–77.0)
Platelets: 279 10*3/uL (ref 150.0–400.0)
RBC: 5.1 Mil/uL (ref 3.87–5.11)
RDW: 14.6 % (ref 11.5–15.5)
WBC: 7.4 10*3/uL (ref 4.0–10.5)

## 2015-05-19 NOTE — Patient Instructions (Addendum)
Alicia Guerrero , Thank you for taking time to come for your Medicare Wellness Visit. I appreciate your ongoing commitment to your health goals. Please review the following plan we discussed and let me know if I can assist you in the future.   Reviewed for annual vision exam;  Services for the [place]: [phone] - [place]: [phone]  To enable visually impaired persons to regain or maintain a level of safety and independence and remain in their current living and work situations.  The [facility] assistance for eyewear is coordinated through [facility]; Please call Cherlyn Labella at [phone]  Will monitor A1C result and if over 6.7; Up in the in 7 range, may want to discuss treatment   Alicia Guerrero will call [facility] and get last dexa scan report Zostavax was added to med list as confirmed by the patient   These are the goals we discussed: Goals    . patient     The patient will discuss boundary of duties based on current self care needs        This is a list of the screening recommended for you and due dates:  Health Maintenance  Topic Date Due  . Complete foot exam   08/16/1945  . Eye exam for diabetics  [birth year]16/1945  . Tetanus Vaccine  08/15/1956  . Shingles Vaccine  08/15/1997  . DEXA scan (bone density measurement)  08/16/2002  . Hemoglobin A1C  11/13/2014  . Flu Shot  08/17/2015  . Colon Cancer Screening  03/13/2016  . Pneumonia vaccines  Completed     Screening for Type 2 Diabetes Screening is a way to check for type 2 diabetes in people who do not have symptoms of the disease, but who may likely develop diabetes in the future. Diabetes can lead to serious health problems, but finding diabetes early allows for early treatment. DIABETES RISK FACTORS   Family history of diabetes.  Diseases of the pancreas.  Obesity or being overweight.  Certain racial or ethnic groups:  American [place].  Pacific Islander.  Hispanic.  Asian.  African American.  High blood pressure  (hypertension).  History of diabetes while pregnant (gestational diabetes).  Delivering a baby that weighed over 9 pounds.  Being inactive.  High cholesterol or triglycerides.  Age, especially over 13 years of age.  Other diseases or conditions.  Diseases of the pancreas.  Cardiovascular disease.  Disorders of the endocrine system.  Certain medicines, such as those that treat high blood cholesterol levels. WHO IS SCREENED Adults  Adults who have no risk factors and no symptoms should be screened starting at age 67. If the screening tests are normal, they should be repeated every 3 years.  Adults who do not have symptoms, but have 1 or more risk factors, should be screened.  Adults who have 2 or more risk factors may be screened every year.  Adults who have an A1c (3 month average of blood glucose) greater than 5.7% or who had an impaired glucose tolerance (IGT) or impaired fasting glucose (IFG) on a previous test should be screened.  Pregnant women who have risk factors should be screened at their first prenatal visit.  Women who have given birth and had gestational diabetes should be screened 6-12 weeks after the child is born. This screening should be repeated every 1-3 years after the first test. Children or Adolescents  Children and adolescents should be screened for type 2 diabetes if they are overweight and have 2 of the following risk factors:  Having a family history of type 2 diabetes.  Being a member of a high risk race or ethnic group.  Having signs of insulin resistance or conditions associated with insulin resistance.  Having a mother who had gestational diabetes while pregnant with him or her.  Screening should start at age 60 or at the onset of puberty, whichever comes first. This should be repeated every 2 years. SCREENING In a screening, your caregiver may:  Ask questions about your overall health. This will include questions about the health of  close family members, too.  Ask about any diabetes-like symptoms you may have.  Perform a physical exam.  Order some tests that may include:  A fasting plasma glucose test. This measures the level of glucose in your blood. It is done after you have had nothing to eat but water (fasted) for 8 hours.  A random blood glucose test. This test is done without the need to fast.  An oral glucose tolerance test. This is a blood test done in 2 parts. First, a blood sample is taken after you have fasted. Then, another sample is taken after you drink a liquid that contains a lot of sugar.  An A1c test. This test shows how much glucose has been in your blood over the past 2 to 3 months.   This information is not intended to replace advice given to you by your health care provider. Make sure you discuss any questions you have with your health care provider.   Document Released: 10/29/2008 Document Revised: 01/23/2014 Document Reviewed: 08/10/2010 Elsevier Interactive Patient Education 2016 [place] in the Home  Falls can cause injuries. They can happen to people of all ages. There are many things you can do to make your home safe and to help prevent falls.  WHAT CAN I DO ON THE OUTSIDE OF MY HOME?  Regularly fix the edges of walkways and driveways and fix any cracks.  Remove anything that might make you trip as you walk through a door, such as a raised step or threshold.  Trim any bushes or trees on the path to your home.  Use bright outdoor lighting.  Clear any walking paths of anything that might make someone trip, such as rocks or tools.  Regularly check to see if handrails are loose or broken. Make sure that both sides of any steps have handrails.  Any raised decks and porches should have guardrails on the edges.  Have any leaves, snow, or ice cleared regularly.  Use sand or salt on walking paths during winter.  Clean up any spills in your garage right away.  This includes oil or grease spills. WHAT CAN I DO IN THE BATHROOM?   Use night lights.  Install grab bars by the toilet and in the tub and shower. Do not use towel bars as grab bars.  Use non-skid mats or decals in the tub or shower.  If you need to sit down in the shower, use a plastic, non-slip stool.  Keep the floor dry. Clean up any water that spills on the floor as soon as it happens.  Remove soap buildup in the tub or shower regularly.  Attach bath mats securely with double-sided non-slip rug tape.  Do not have throw rugs and other things on the floor that can make you trip. WHAT CAN I DO IN THE BEDROOM?  Use night lights.  Make sure that you have a light by your bed that is easy to  reach.  Do not use any sheets or blankets that are too big for your bed. They should not hang down onto the floor.  Have a firm chair that has side arms. You can use this for support while you get dressed.  Do not have throw rugs and other things on the floor that can make you trip. WHAT CAN I DO IN THE KITCHEN?  Clean up any spills right away.  Avoid walking on wet floors.  Keep items that you use a lot in easy-to-reach places.  If you need to reach something above you, use a strong step stool that has a grab bar.  Keep electrical cords out of the way.  Do not use floor polish or wax that makes floors slippery. If you must use wax, use non-skid floor wax.  Do not have throw rugs and other things on the floor that can make you trip. WHAT CAN I DO WITH MY STAIRS?  Do not leave any items on the stairs.  Make sure that there are handrails on both sides of the stairs and use them. Fix handrails that are broken or loose. Make sure that handrails are as long as the stairways.  Check any carpeting to make sure that it is firmly attached to the stairs. Fix any carpet that is loose or worn.  Avoid having throw rugs at the top or bottom of the stairs. If you do have throw rugs, attach them  to the floor with carpet tape.  Make sure that you have a light switch at the top of the stairs and the bottom of the stairs. If you do not have them, ask someone to add them for you. WHAT ELSE CAN I DO TO HELP PREVENT FALLS?  Wear shoes that:  Do not have high heels.  Have rubber bottoms.  Are comfortable and fit you well.  Are closed at the toe. Do not wear sandals.  If you use a stepladder:  Make sure that it is fully opened. Do not climb a closed stepladder.  Make sure that both sides of the stepladder are locked into place.  Ask someone to hold it for you, if possible.  Clearly mark and make sure that you can see:  Any grab bars or handrails.  First and last steps.  Where the edge of each step is.  Use tools that help you move around (mobility aids) if they are needed. These include:  Canes.  Walkers.  Scooters.  Crutches.  Turn on the lights when you go into a dark area. Replace any light bulbs as soon as they burn out.  Set up your furniture so you have a clear path. Avoid moving your furniture around.  If any of your floors are uneven, fix them.  If there are any pets around you, be aware of where they are.  Review your medicines with your doctor. Some medicines can make you feel dizzy. This can increase your chance of falling. Ask your doctor what other things that you can do to help prevent falls.   This information is not intended to replace advice given to you by your health care provider. Make sure you discuss any questions you have with your health care provider.   Document Released: 10/29/2008 Document Revised: 05/19/2014 Document Reviewed: 02/06/2014 Elsevier Interactive Patient Education 2016 [employer].  Health Maintenance, Female Adopting a healthy lifestyle and getting preventive care can go a long way to promote health and wellness. Talk with your health care provider  about what schedule of regular examinations is right for you. This  is a good chance for you to check in with your provider about disease prevention and staying healthy. In between checkups, there are plenty of things you can do on your own. Experts have done a lot of research about which lifestyle changes and preventive measures are most likely to keep you healthy. Ask your health care provider for more information. WEIGHT AND DIET  Eat a healthy diet  Be sure to include plenty of vegetables, fruits, low-fat dairy products, and lean protein.  Do not eat a lot of foods high in solid fats, added sugars, or salt.  Get regular exercise. This is one of the most important things you can do for your health.  Most adults should exercise for at least 150 minutes each week. The exercise should increase your heart rate and make you sweat (moderate-intensity exercise).  Most adults should also do strengthening exercises at least twice a week. This is in addition to the moderate-intensity exercise.  Maintain a healthy weight  Body mass index (BMI) is a measurement that can be used to identify possible weight problems. It estimates body fat based on height and weight. Your health care provider can help determine your BMI and help you achieve or maintain a healthy weight.  For females 20 years of age and older:   A BMI below 18.5 is considered underweight.  A BMI of 18.5 to 24.9 is normal.  A BMI of 25 to 29.9 is considered overweight.  A BMI of 30 and above is considered obese.  Watch levels of cholesterol and blood lipids  You should start having your blood tested for lipids and cholesterol at 78 years of age, then have this test every 5 years.  You may need to have your cholesterol levels checked more often if:  Your lipid or cholesterol levels are high.  You are older than 78 years of age.  You are at high risk for heart disease.  CANCER SCREENING   Lung Cancer  Lung cancer screening is recommended for adults 25-57 years old who are at high risk  for lung cancer because of a history of smoking.  A yearly low-dose CT scan of the lungs is recommended for people who:  Currently smoke.  Have quit within the past 15 years.  Have at least a 30-pack-year history of smoking. A pack year is smoking an average of one pack of cigarettes a day for 1 year.  Yearly screening should continue until it has been 15 years since you quit.  Yearly screening should stop if you develop a health problem that would prevent you from having lung cancer treatment.  Breast Cancer  Practice breast self-awareness. This means understanding how your breasts normally appear and feel.  It also means doing regular breast self-exams. Let your health care provider know about any changes, no matter how small.  If you are in your 20s or 30s, you should have a clinical breast exam (CBE) by a health care provider every 1-3 years as part of a regular health exam.  If you are 46 or older, have a CBE every year. Also consider having a breast X-ray (mammogram) every year.  If you have a family history of breast cancer, talk to your health care provider about genetic screening.  If you are at high risk for breast cancer, talk to your health care provider about having an MRI and a mammogram every year.  Breast cancer gene (  BRCA) assessment is recommended for women who have family members with BRCA-related cancers. BRCA-related cancers include:  Breast.  Ovarian.  Tubal.  Peritoneal cancers.  Results of the assessment will determine the need for genetic counseling and BRCA1 and BRCA2 testing. Cervical Cancer Your health care provider may recommend that you be screened regularly for cancer of the pelvic organs (ovaries, uterus, and vagina). This screening involves a pelvic examination, including checking for microscopic changes to the surface of your cervix (Pap test). You may be encouraged to have this screening done every 3 years, beginning at age 61.  For women  ages 67-65, health care providers may recommend pelvic exams and Pap testing every 3 years, or they may recommend the Pap and pelvic exam, combined with testing for human papilloma virus (HPV), every 5 years. Some types of HPV increase your risk of cervical cancer. Testing for HPV may also be done on women of any age with unclear Pap test results.  Other health care providers may not recommend any screening for nonpregnant women who are considered low risk for pelvic cancer and who do not have symptoms. Ask your health care provider if a screening pelvic exam is right for you.  If you have had past treatment for cervical cancer or a condition that could lead to cancer, you need Pap tests and screening for cancer for at least 20 years after your treatment. If Pap tests have been discontinued, your risk factors (such as having a new sexual partner) need to be reassessed to determine if screening should resume. Some women have medical problems that increase the chance of getting cervical cancer. In these cases, your health care provider may recommend more frequent screening and Pap tests. Colorectal Cancer  This type of cancer can be detected and often prevented.  Routine colorectal cancer screening usually begins at 78 years of age and continues through 78 years of age.  Your health care provider may recommend screening at an earlier age if you have risk factors for colon cancer.  Your health care provider may also recommend using home test kits to check for hidden blood in the stool.  A small camera at the end of a tube can be used to examine your colon directly (sigmoidoscopy or colonoscopy). This is done to check for the earliest forms of colorectal cancer.  Routine screening usually begins at age 67.  Direct examination of the colon should be repeated every 5-10 years through 78 years of age. However, you may need to be screened more often if early forms of precancerous polyps or small growths  are found. Skin Cancer  Check your skin from head to toe regularly.  Tell your health care provider about any new moles or changes in moles, especially if there is a change in a mole's shape or color.  Also tell your health care provider if you have a mole that is larger than the size of a pencil eraser.  Always use sunscreen. Apply sunscreen liberally and repeatedly throughout the day.  Protect yourself by wearing long sleeves, pants, a wide-brimmed hat, and sunglasses whenever you are outside. HEART DISEASE, DIABETES, AND HIGH BLOOD PRESSURE   High blood pressure causes heart disease and increases the risk of stroke. High blood pressure is more likely to develop in:  People who have blood pressure in the high end of the normal range (130-139/85-89 mm Hg).  People who are overweight or obese.  People who are African American.  If you are 18-39 years  of age, have your blood pressure checked every 3-5 years. If you are 13 years of age or older, have your blood pressure checked every year. You should have your blood pressure measured twice--once when you are at a hospital or clinic, and once when you are not at a hospital or clinic. Record the average of the two measurements. To check your blood pressure when you are not at a hospital or clinic, you can use:  An automated blood pressure machine at a pharmacy.  A home blood pressure monitor.  If you are between 34 years and 28 years old, ask your health care provider if you should take aspirin to prevent strokes.  Have regular diabetes screenings. This involves taking a blood sample to check your fasting blood sugar level.  If you are at a normal weight and have a low risk for diabetes, have this test once every three years after 78 years of age.  If you are overweight and have a high risk for diabetes, consider being tested at a younger age or more often. PREVENTING INFECTION  Hepatitis B  If you have a higher risk for hepatitis  B, you should be screened for this virus. You are considered at high risk for hepatitis B if:  You were born in a country where hepatitis B is common. Ask your health care provider which countries are considered high risk.  Your parents were born in a high-risk country, and you have not been immunized against hepatitis B (hepatitis B vaccine).  You have HIV or AIDS.  You use needles to inject street drugs.  You live with someone who has hepatitis B.  You have had sex with someone who has hepatitis B.  You get hemodialysis treatment.  You take certain medicines for conditions, including cancer, organ transplantation, and autoimmune conditions. Hepatitis C  Blood testing is recommended for:  Everyone born from [birth year] through 1963.  Anyone with known risk factors for hepatitis C. Sexually transmitted infections (STIs)  You should be screened for sexually transmitted infections (STIs) including gonorrhea and chlamydia if:  You are sexually active and are younger than 77 years of age.  You are older than 79 years of age and your health care provider tells you that you are at risk for this type of infection.  Your sexual activity has changed since you were last screened and you are at an increased risk for chlamydia or gonorrhea. Ask your health care provider if you are at risk.  If you do not have HIV, but are at risk, it may be recommended that you take a prescription medicine daily to prevent HIV infection. This is called pre-exposure prophylaxis (PrEP). You are considered at risk if:  You are sexually active and do not regularly use condoms or know the HIV status of your partner(s).  You take drugs by injection.  You are sexually active with a partner who has HIV. Talk with your health care provider about whether you are at high risk of being infected with HIV. If you choose to begin PrEP, you should first be tested for HIV. You should then be tested every 3 months for as long as  you are taking PrEP.  PREGNANCY   If you are premenopausal and you may become pregnant, ask your health care provider about preconception counseling.  If you may become pregnant, take 400 to 800 micrograms (mcg) of folic acid every day.  If you want to prevent pregnancy, talk to your health care provider  about birth control (contraception). OSTEOPOROSIS AND MENOPAUSE   Osteoporosis is a disease in which the bones lose minerals and strength with aging. This can result in serious bone fractures. Your risk for osteoporosis can be identified using a bone density scan.  If you are 32 years of age or older, or if you are at risk for osteoporosis and fractures, ask your health care provider if you should be screened.  Ask your health care provider whether you should take a calcium or vitamin D supplement to lower your risk for osteoporosis.  Menopause may have certain physical symptoms and risks.  Hormone replacement therapy may reduce some of these symptoms and risks. Talk to your health care provider about whether hormone replacement therapy is right for you.  HOME CARE INSTRUCTIONS   Schedule regular health, dental, and eye exams.  Stay current with your immunizations.   Do not use any tobacco products including cigarettes, chewing tobacco, or electronic cigarettes.  If you are pregnant, do not drink alcohol.  If you are breastfeeding, limit how much and how often you drink alcohol.  Limit alcohol intake to no more than 1 drink per day for nonpregnant women. One drink equals 12 ounces of beer, 5 ounces of wine, or 1 ounces of hard liquor.  Do not use street drugs.  Do not share needles.  Ask your health care provider for help if you need support or information about quitting drugs.  Tell your health care provider if you often feel depressed.  Tell your health care provider if you have ever been abused or do not feel safe at home.   This information is not intended to replace  advice given to you by your health care provider. Make sure you discuss any questions you have with your health care provider.   Document Released: 07/18/2010 Document Revised: 01/23/2014 Document Reviewed: 12/04/2012 Elsevier Interactive Patient Education [employer].

## 2015-05-19 NOTE — Progress Notes (Addendum)
Subjective:   Alicia Guerrero is a 80 y.o. female who presents for Medicare Annual (Subsequent) preventive examination.  Review of Systems:  HRA assessment completed during visit;   The Patient was informed that this wellness visit is to identify risk and educate on how to reduce risk for increase disease through lifestyle changes.   ROS deferred to CPE exam with physician completed recently Family and medical hx given below;  Sister had "widow maker" CAD but still living; 60 months older  Father succumb from massive MI   Lifestyle review:  HTN; good control today;  Atrial Fib DM2' stated she needed to cut back on sugar but was not diabetic. Had gestational diabetes; Given information on Diabetic numbers; not sure if she was sick last year but A1c checked today; States she does like sweets  Hyperlipidemia rechecked today Biventricular pacemaker; no issues per the patient Hx breast cancer on right; no chemo; radiation;was 80 yo  Annual mammogram on the left  Stroke x 80 yo; taking eliquis; involved in the right side;   Lives with spouse; he still fairly independent but complains a lot   Tobacco: no  ETOH no   Medication review/ Adherent; was putting K in glass of liquid to soften prior to taking; Is going to call the pharmacy today to check on delivery of cream recently ordered; Will check on directions for K or recommend liquid type that is covered by the plan;   BMI: 31 Diet;  Eat meal for breakfast; eggs, love bacon, loves breakfast;  Eats what her spouse does not eat; homemade soups; has to fix 2 meals Greens and green beans; locked in to fixing what spouse want eat  Is starting to fix what she wants to eat; spouse is very picky Loves to cook cabbage and corn beef  Agreed that going out to eat may be a good plan for them both as they both can eat whatever they choose  (planning 2 meals for herself and spouse creating mores stress for the patient)   Exercise;  [place]rying  [place] and walks to get mail;  Takes 30 minutes every day  Does her own shopping;  Works her blueberries patches   HOME SAFETY[place] home is [place]  2 bathrooms (his and hers); stand alone shower  No falls; no fear of falling   Removal of clutter clearing paths through the home,  Railing as needed discussed  May have issues due to spouse disabilities and may have to move in the future;  May be a time when spouse has to go to nursing home  Spouse is very dependent on wife creating stress  Community safety; yes Smoke detectors yes Firearms safety reviewed and will keep in a safe place if these exist. Driving accidents; no;  Advised to use sun protection or large brim hat  Stressors (1-5) 2-3 Go walking; go out and work in the yard;  Spouse is crying at times; has a Chiropodist  Sleeps anti-depressants;   Depression: Denies feeling depressed or hopeless; voices pleasure in daily life Mood stable; at times; keep in "my blessings" to get up and function/ go to church  Spouse did drink but does not anymore; Goes to church now which they both enjoy   Goal; is to make a list of activities for the spouse and enlist his input into what he can start doing that will lessen her spouse.  The specific goal is the patient will help the spouse to feel confident in pouring and taking his own meds;   Cognitive; Presents with a few verbalized recall issues; started shortly after her stroke; today, she is engaged in Public affairs consultant checkbook, medications; no failures of task Ad8 score reviewed for issues; . Issues making decisions; no . Less interest in hobbies / activities" no . Repeats questions, stories; family complaining: NO . Trouble using ordinary gadgets; microwave; computer: no . Forgets the month or year: no . Mismanaging finances: no . Missing apt: no but does write them down . Daily problems with thinking of  memory NO Ad8 score is 0   Fall assessment no Gait assessment appears normal;   Mobilization and Functional losses from last year to this year? Does all ADL's; grocery shops; cooks; manages home; laundry; needs some assistance as she is more fatigued and overwhelmed   Sleep pattern changes; states she sleep fine  Urinary or fecal incontinence reviewed: no  Counseling Health Maintenance Colonoscopy; 02/2011 due 02/2016 EKG: 04/2015  Mammogram: 09/2014 Dexa/ was normal and declines dexa; Dr. Isaiah Blakes at [facility] / is current  Call to [facility] and will fax over last DEXA from Dec 2013  PAP; no more pap; TAH   Hearing: 4000hz  in both ears   Ophthalmology exam; last spring; hasn't had eye glass rx filled [employer] may assist; given information [place] [facility] doctor [place] [address];   Immunizations Due: tries to avoid the tetanus due allergic reaction; (local) was given in  doses prior but now avoids/ had the last one around the time of retirement in 17;;    Advanced Directive; yes; will bring copy for chart  Health Recommendations and Referrals Given resource for assistance for eye glasses Collaborated on setting boundary for spouse to start doing the things he can do to take more stress off of her. Agreed also to eat at local restaurants so they both can eat what the choose; the wife will not have to cook only what the spouse wants and then eat what he chooses not to eat.    Current Care Team reviewed and updated   Cardiac Risk Factors include: advanced age (>81mn, >>30women);dyslipidemia;hypertension;obesity (BMI >30kg/m2)     Objective:     Vitals: BP 130/80 mmHg  Ht 5' 3"  (1.6 m)  Wt 177 lb 4 oz (80.4 kg)  BMI 31.41 kg/m2  Body mass index is 31.41 kg/(m^2).   Tobacco History  Smoking status  . Never Smoker   Smokeless tobacco  . Never Used     Counseling given: Yes   Past Medical History  Diagnosis Date  . Left bundle branch block   . Atrial fibrillation  ([place]   . CAD (coronary artery disease)     a. s/p stent to RCA 1996;  b. s/p cutting balloon PTCA to LAD 2001; c. s/p Cypher DES to LAD 2003;  d. NSTEMI 4/12: DES to dRCA;  e.  LBent9/13 (after presentation with cardiac arrest and + Nz's for NSTEMI): pLAD 30% ISR, oD1 30%, dLAD 60-70%, pOM1 (small vessel) 90%, oOM2 95% (moderate to large vessel), mCFX 30%, pOM3 30-40%, dRCA stents and pPDA stents patent with 30% ISR => med Rx rec.   . Ischemic cardiomyopathy     a. EF 40% at cath 4/12 with AL and Inf HK;  b.  Echo after presentation with cardiac arrest 9/13:  EF 40-45% and severe inf HK to AK c/w infarction, PASP 42  . HLD (  hyperlipidemia)   . HTN (hypertension)   . Other atopic dermatitis and related conditions   . Allergic rhinitis due to pollen   . Asthma   . Other and unspecified ovarian cyst   . Personal history of malignant neoplasm of breast   . Personal history of hyperthyroidism   . Chronic eczema     hands  . Hemorrhoids   . Cancer ([place])   . Bradycardia     a. coreg tapered off 05/2011 => b. Metoprolol restarted after cardiac arrest 09/2011  . Cardiac arrest ([place]) 09/2011    a. VT/VF in community; resusc unsuccessful;  PEA in ED; multiple defibs => revived;  [employer];  c/b by VDRF, cardiogenic shock;  LHC with stable anatomy => med Rx;  difficult ween from vent => s/p trach;  d/c to SNF  . Chronic systolic heart failure ([place])   . Biventricular ICD (implantable cardiac defibrillator) in place     St Jude 10/2011 (implant MD: Allred)   Past Surgical History  Procedure Laterality Date  . Oophorectomy    . Abdominal hysterectomy    . Ptca    . Cholecystectomy    . Coronary stent placement  may and  july 2012  . Breast lumpectomy  08/24/2003    right lumpectomy+sln,T2N0,ERPR+,Her2-  . Polypectomy    . Colonoscopy    . Video bronchoscopy  10/26/2011    Procedure: VIDEO BRONCHOSCOPY WITHOUT FLUORO;  Surgeon: Raylene Miyamoto, MD;  Location: [facility];  Service:  Endoscopy;  Laterality: Bilateral;  . Left heart catheterization with coronary angiogram N/A 10/09/2011    Procedure: LEFT HEART CATHETERIZATION WITH CORONARY ANGIOGRAM;  Surgeon: Larey Dresser, MD;  Location: [facility] CATH LAB;  Service: Cardiovascular;  Laterality: N/A;  . Bi-ventricular implantable cardioverter defibrillator N/A 11/02/2011    Procedure: BI-VENTRICULAR IMPLANTABLE CARDIOVERTER DEFIBRILLATOR  (CRT-D);  Surgeon: Thompson Grayer, MD;  Location: [facility] CATH LAB;  Service: Cardiovascular;  Laterality: N/A;   Family History  Problem Relation Age of Onset  . Uterine cancer Mother   . Hypertension Mother   . Coronary artery disease Father   . Heart disease Sister   . Heart disease Brother   . Endometrial cancer Sister   . Heart disease Brother   . Clotting disorder Brother     blood clot  . Colon cancer Neg Hx    History  Sexual Activity  . Sexual Activity: Not on file    Outpatient Encounter Prescriptions as of 05/19/2015  Medication Sig  . acetaminophen (TYLENOL) 325 MG tablet Take 650 mg by mouth every 6 (six) hours as needed. For pain  . albuterol (PROVENTIL) (2.5 MG/3ML) 0.083% nebulizer solution Take 3 mLs (2.5 mg total) by nebulization every 6 (six) hours as needed.  Marland Kitchen apixaban (ELIQUIS) 5 MG TABS tablet Take 1 tablet (5 mg total) by mouth 2 (two) times daily.  Marland Kitchen atorvastatin (LIPITOR) 40 MG tablet Take 1 tablet (40 mg total) by mouth daily.  . bisoprolol (ZEBETA) 5 MG tablet Take 1 tablet (5 mg total) by mouth daily.  . cetirizine (ZYRTEC) 10 MG tablet Take 1 tablet (10 mg total) by mouth as needed for allergies.  . furosemide (LASIX) 40 MG tablet Take 1 tablet (40 mg total) by mouth daily.  Marland Kitchen ketoconazole (NIZORAL) 2 % cream Apply 1 application topically daily. Under breasts  . nitroGLYCERIN (NITROSTAT) 0.4 MG SL tablet Place 1 tablet (0.4 mg total) under the tongue every 5 (five) minutes x 3 doses as needed  for chest pain. Chest pain  . potassium chloride SA (K-DUR,KLOR-CON)  20 MEQ tablet Take 1 tablet (20 mEq total) by mouth daily.  Marland Kitchen triamcinolone ointment (KENALOG) 0.5 % Apply 1 application topically 2 (two) times daily. On the leg rash  . valsartan (DIOVAN) 80 MG tablet Take 1 tablet (80 mg total) by mouth daily.   No facility-administered encounter medications on file as of 05/19/2015.    Activities of Daily Living In your present state of health, do you have any difficulty performing the following activities: 05/19/2015  Hearing? N  Vision? Y  Difficulty concentrating or making decisions? N  Walking or climbing stairs? N  Dressing or bathing? N  Doing errands, shopping? N  Preparing Food and eating ? N  Using the Toilet? N  In the past six months, have you accidently leaked urine? N  Do you have problems with loss of bowel control? N  Managing your Medications? N  Managing your Finances? N  Housekeeping or managing your Housekeeping? N    Patient Care Team: [name], MD as PCP - General (Internal Medicine) Collene Schlichter, Student-PA as Consulting Physician (Gastroenterology) [name], MD as Consulting Physician (Cardiology) Tanda Rockers, MD as Consulting Physician (Pulmonary Disease)    Assessment:     Exercise Activities and Dietary recommendations Current Exercise Habits: Home exercise routine, Type of exercise: walking, Time (Minutes): 30, Frequency (Times/Week): 6 (walks every day weather permitting), Weekly Exercise (Minutes/Week): 180, Intensity: Mild  Goals    . patient     The patient will discuss boundary of duties based on current self care needs       Fall Risk Fall Risk  05/19/2015 05/14/2015 05/14/2014  Falls in the past year? Yes No No   Depression Screen PHQ 2/9 Scores 05/19/2015 05/14/2015 05/14/2014  PHQ - 2 Score 0 1 0     Cognitive Testing No flowsheet data found.  Immunization History  Administered Date(s) Administered  . Influenza Split 10/16/2013  . Influenza Whole 10/20/2008  .  Influenza,inj,Quad PF,36+ Mos 10/29/2012  . Influenza-Unspecified 10/18/2014  . Pneumococcal Conjugate-13 06/04/2013  . Pneumococcal Polysaccharide-23 11/19/2007  . Zoster 01/08/2008  No issues or loss of function  Screening Tests Health Maintenance  Topic Date Due  . FOOT EXAM  08/16/1945  . OPHTHALMOLOGY EXAM  [birth year]16/1947  . TETANUS/TDAP  08/15/1956  . ZOSTAVAX  08/15/1997  . DEXA SCAN  08/16/2002  . HEMOGLOBIN A1C  11/13/2014  . INFLUENZA VACCINE  08/17/2015  . COLONOSCOPY  03/13/2016  . PNA vac Low Risk Adult  Completed      Plan:  [facility] assistance for eyewear is coordinated through [facility]; Please call Cherlyn Labella at [phone]  Will monitor A1C result and if over 6.7; Up in the in 7 range, may want to discuss treatment. Can be seen at the [facility] Diabetes and Nutrition center if Blood sugar remains elevated   Manuela Schwartz will call [facility] and get last dexa scan report/ completed and to fax dexa completed 12/2011 Zostavax was added to med list as confirmed by the patient   During the course of the visit the patient was educated and counseled about the following appropriate screening and preventive services:   Vaccines to include Pneumoccal, Influenza, Hepatitis B, Td, Zostavax, HCV/ hold on tetanus due to allergies  Electrocardiogram/ this year via cardiology   Cardiovascular Disease/ being managed  Colorectal cancer screening/ due 02/2016  Bone density screening/ 12/2011 confirmed; asked for report  Diabetes screening/ ongoing  and being checked today  Glaucoma screening/ annual eye exam  Mammography/09/2014/ due 09/2015 at [facility] measured site; left femoral neck -1.9; taking Vtit D   Nutrition counseling / discussed fup if A1c continues to be elvated   Patient Instructions (the written plan) was given to the patient.   Educated regarding osteopenia; Dr. Isaiah Blakes following and taking Vit D per the patient.   CBSWH,QPRFF, RN  05/19/2015     Medical  screening examination/treatment/procedure(s) were performed by non-physician practitioner and as supervising physician I was immediately available for consultation/collaboration. I agree with above. Walker Kehr, MD

## 2015-05-20 ENCOUNTER — Other Ambulatory Visit: Payer: Self-pay | Admitting: Internal Medicine

## 2015-05-20 MED ORDER — METFORMIN HCL 500 MG PO TABS: 500.0000 mg | ORAL_TABLET | Freq: Every day | ORAL | Status: DC

## 2015-06-17 ENCOUNTER — Other Ambulatory Visit: Payer: Self-pay | Admitting: *Deleted

## 2015-06-17 MED ORDER — METFORMIN HCL 500 MG PO TABS: 500.0000 mg | ORAL_TABLET | Freq: Every day | ORAL | Status: DC

## 2015-07-26 ENCOUNTER — Telehealth: Payer: Self-pay | Admitting: Cardiology

## 2015-07-26 ENCOUNTER — Encounter: Payer: Medicare HMO | Admitting: *Deleted

## 2015-07-26 NOTE — Telephone Encounter (Signed)
Confirmed remote transmission w/ pt son.    

## 2015-09-28 DIAGNOSIS — Z23 Encounter for immunization: Secondary | ICD-10-CM | POA: Diagnosis not present

## 2015-10-18 ENCOUNTER — Encounter: Payer: Self-pay | Admitting: Internal Medicine

## 2015-10-18 DIAGNOSIS — Z1231 Encounter for screening mammogram for malignant neoplasm of breast: Secondary | ICD-10-CM | POA: Diagnosis not present

## 2015-10-18 DIAGNOSIS — Z853 Personal history of malignant neoplasm of breast: Secondary | ICD-10-CM | POA: Diagnosis not present

## 2015-10-18 DIAGNOSIS — C50411 Malignant neoplasm of upper-outer quadrant of right female breast: Secondary | ICD-10-CM | POA: Diagnosis not present

## 2015-10-18 DIAGNOSIS — M85852 Other specified disorders of bone density and structure, left thigh: Secondary | ICD-10-CM | POA: Diagnosis not present

## 2015-10-18 LAB — HM MAMMOGRAPHY

## 2015-10-22 DIAGNOSIS — I5022 Chronic systolic (congestive) heart failure: Secondary | ICD-10-CM | POA: Diagnosis not present

## 2015-10-22 DIAGNOSIS — E119 Type 2 diabetes mellitus without complications: Secondary | ICD-10-CM | POA: Diagnosis not present

## 2015-10-22 DIAGNOSIS — I110 Hypertensive heart disease with heart failure: Secondary | ICD-10-CM | POA: Diagnosis not present

## 2015-10-22 DIAGNOSIS — Z0000 Encounter for general adult medical examination without abnormal findings: Secondary | ICD-10-CM | POA: Diagnosis not present

## 2015-10-22 DIAGNOSIS — E7800 Pure hypercholesterolemia, unspecified: Secondary | ICD-10-CM | POA: Diagnosis not present

## 2015-10-22 DIAGNOSIS — I252 Old myocardial infarction: Secondary | ICD-10-CM | POA: Diagnosis not present

## 2015-10-26 ENCOUNTER — Encounter: Payer: Self-pay | Admitting: Internal Medicine

## 2015-12-14 ENCOUNTER — Telehealth: Payer: Self-pay | Admitting: Internal Medicine

## 2015-12-14 NOTE — Telephone Encounter (Signed)
Okay to give samples? I am inquiring due to the fact that her pcp last refilled this. Please advise. Thanks, MI

## 2015-12-14 NOTE — Telephone Encounter (Signed)
OK for samples- I think we initially started this her on this back in 2014.  Thanks!

## 2015-12-14 NOTE — Telephone Encounter (Signed)
New Message  Patient calling the office for samples of medication:   1.  What medication and dosage are you requesting samples for? eliquis 5 mg tablets twice daily totaling 5 mg   2.  Are you currently out of this medication?  Only has two pills left  Pt voiced they're in the donut hole

## 2015-12-15 NOTE — Telephone Encounter (Signed)
I called patient to let her know that I would place some samples at the front desk. Her home phone rang busy and her cell phone does not have vm set up. I will try to reach her again later.

## 2015-12-15 NOTE — Telephone Encounter (Signed)
Follow UP    Alicia Guerrero is calling back concerning her medication , she has not heard from anyone as of yet . Please call   Thanks

## 2015-12-15 NOTE — Telephone Encounter (Signed)
Patient aware.

## 2016-01-03 ENCOUNTER — Telehealth: Payer: Self-pay | Admitting: Internal Medicine

## 2016-01-03 NOTE — Telephone Encounter (Signed)
New Message  Patient calling the office for samples of medication:   1.  What medication and dosage are you requesting samples for? apixaban (Eliquis) 5 mg tablets  2.  Are you currently out of this medication?  Pt only has 3 days left

## 2016-01-05 ENCOUNTER — Telehealth: Payer: Self-pay | Admitting: Internal Medicine

## 2016-01-05 NOTE — Telephone Encounter (Signed)
New message  Pt is calling again about samples for Eliquis that were requested on 12/18  Please call pt back and advise, she only has 2 left

## 2016-01-05 NOTE — Telephone Encounter (Signed)
placed 2 boxes at front desk. pt aware.

## 2016-01-31 ENCOUNTER — Encounter: Payer: Self-pay | Admitting: Gastroenterology

## 2016-03-29 ENCOUNTER — Other Ambulatory Visit: Payer: Self-pay | Admitting: *Deleted

## 2016-03-29 DIAGNOSIS — R69 Illness, unspecified: Secondary | ICD-10-CM | POA: Diagnosis not present

## 2016-03-29 MED ORDER — GLUCOSE BLOOD VI STRP: 1.0000 | ORAL_STRIP | Freq: Two times a day (BID) | 2 refills | Status: DC

## 2016-03-29 NOTE — Telephone Encounter (Signed)
Left msg on triage requesting rx for one touch ultra testing strips sent to [facility]. Sent electronically...lmb

## 2016-04-03 DIAGNOSIS — R69 Illness, unspecified: Secondary | ICD-10-CM | POA: Diagnosis not present

## 2016-04-03 MED ORDER — ONETOUCH ULTRASOFT LANCETS MISC: 1.0000 | Freq: Two times a day (BID) | 3 refills | Status: DC

## 2016-04-03 MED ORDER — ONETOUCH ULTRA 2 W/DEVICE KIT: PACK | 0 refills | Status: DC

## 2016-04-03 NOTE — Telephone Encounter (Signed)
Pt left msg on Friday afternoon stating she need rx for BS monitor too. The one that she has no longer work. Sent to [place].Marland KitchenJohny Chess

## 2016-04-03 NOTE — Addendum Note (Signed)
Addended by: Earnstine Regal on: 04/03/2016 09:15 AM   Modules accepted: Orders

## 2016-04-12 ENCOUNTER — Telehealth: Payer: Self-pay

## 2016-04-17 ENCOUNTER — Other Ambulatory Visit: Payer: Self-pay | Admitting: *Deleted

## 2016-04-17 MED ORDER — VALSARTAN 80 MG PO TABS: 80.0000 mg | ORAL_TABLET | Freq: Every day | ORAL | 0 refills | Status: DC

## 2016-04-18 NOTE — Telephone Encounter (Signed)
error 

## 2016-04-21 ENCOUNTER — Encounter: Payer: Self-pay | Admitting: Internal Medicine

## 2016-05-08 ENCOUNTER — Other Ambulatory Visit: Payer: Self-pay | Admitting: Internal Medicine

## 2016-05-09 ENCOUNTER — Ambulatory Visit (INDEPENDENT_AMBULATORY_CARE_PROVIDER_SITE_OTHER): Payer: Medicare HMO | Admitting: Internal Medicine

## 2016-05-09 VITALS — BP 130/64 | HR 76 | Ht 63.0 in | Wt 170.0 lb

## 2016-05-09 DIAGNOSIS — I480 Paroxysmal atrial fibrillation: Secondary | ICD-10-CM

## 2016-05-09 DIAGNOSIS — I5022 Chronic systolic (congestive) heart failure: Secondary | ICD-10-CM | POA: Diagnosis not present

## 2016-05-09 DIAGNOSIS — I255 Ischemic cardiomyopathy: Secondary | ICD-10-CM | POA: Diagnosis not present

## 2016-05-09 DIAGNOSIS — Z95810 Presence of automatic (implantable) cardiac defibrillator: Secondary | ICD-10-CM

## 2016-05-09 DIAGNOSIS — I4901 Ventricular fibrillation: Secondary | ICD-10-CM | POA: Diagnosis not present

## 2016-05-09 NOTE — Progress Notes (Signed)
Patient Care Team: [name], MD as PCP - General (Internal Medicine) Collene Schlichter, Student-PA (Inactive) as Consulting Physician (Gastroenterology) [name], MD as Consulting Physician (Cardiology) Tanda Rockers, MD as Consulting Physician (Pulmonary Disease)   HPI  Alicia Guerrero is a 81 y.o. female Seen in followup for aborted cardiac arrest in the context of coronary artery disease and left bundle branch block. This occurred in the context of coronary artery disease. LHC at that time >>Stents patent in RCA/PDA, LAD, and D1. There is a 95% ostial stenosis of a moderate to large OM1. Looking at the prior cath, it is difficult to tell whether or not this was present before.  Because of persistent left ventricular dysfunction and no clear trigger for the event she successfully underwent CRT-D implantation. 10/13 by Dr. Amaryllis Dyke   She was hospitalized 11/14 for strokes x2 that occurred in the context of a supratherapeutic INR. It was elected to switch her to apixaban. No bleeding issues  Date Cr Hgb  5/17 1.13 13.8  /     She denies chest pain or shortness of breath. There has been no peripheral edema.  Struggling with night cramps in legs  Currently relieved by pickle juice      Past Medical History:  Diagnosis Date  . Allergic rhinitis due to pollen   . Asthma   . Atrial fibrillation ([place])   . Biventricular ICD (implantable cardiac defibrillator) in place    St Jude 10/2011 (implant MD: Allred)  . Bradycardia    a. coreg tapered off 05/2011 => b. Metoprolol restarted after cardiac arrest 09/2011  . CAD (coronary artery disease)    a. s/p stent to RCA 1996;  b. s/p cutting balloon PTCA to LAD 2001; c. s/p Cypher DES to LAD 2003;  d. NSTEMI 4/12: DES to dRCA;  e.  [place] 9/13 (after presentation with cardiac arrest and + Nz's for NSTEMI): pLAD 30% ISR, oD1 30%, dLAD 60-70%, pOM1 (small vessel) 90%, oOM2 95% (moderate to large vessel), mCFX 30%, pOM3 30-40%,  dRCA stents and pPDA stents patent with 30% ISR => med Rx rec.   . Cancer ([place])   . Cardiac arrest ([place]) 09/2011   a. VT/VF in community; resusc unsuccessful;  PEA in ED; multiple defibs => revived;  [employer];  c/b by VDRF, cardiogenic shock;  LHC with stable anatomy => med Rx;  difficult ween from vent => s/p trach;  d/c to SNF  . Chronic eczema    hands  . Chronic systolic heart failure ([place])   . Hemorrhoids   . HLD (hyperlipidemia)   . HTN (hypertension)   . Ischemic cardiomyopathy    a. EF 40% at cath 4/12 with AL and Inf HK;  b.  Echo after presentation with cardiac arrest 9/13:  EF 40-45% and severe inf HK to AK c/w infarction, PASP 42  . Left bundle branch block   . Other and unspecified ovarian cyst   . Other atopic dermatitis and related conditions   . Personal history of hyperthyroidism   . Personal history of malignant neoplasm of breast     Past Surgical History:  Procedure Laterality Date  . ABDOMINAL HYSTERECTOMY    . BI-VENTRICULAR IMPLANTABLE CARDIOVERTER DEFIBRILLATOR N/A 11/02/2011   Procedure: BI-VENTRICULAR IMPLANTABLE CARDIOVERTER DEFIBRILLATOR  (CRT-D);  Surgeon: Thompson Grayer, MD;  Location: [facility] CATH LAB;  Service: Cardiovascular;  Laterality: N/A;  . BREAST LUMPECTOMY  08/24/2003   right lumpectomy+sln,T2N0,ERPR+,Her2-  .  CHOLECYSTECTOMY    . COLONOSCOPY    . CORONARY STENT PLACEMENT  may and  july 2012  . LEFT HEART CATHETERIZATION WITH CORONARY ANGIOGRAM N/A 10/09/2011   Procedure: LEFT HEART CATHETERIZATION WITH CORONARY ANGIOGRAM;  Surgeon: Larey Dresser, MD;  Location: [facility] CATH LAB;  Service: Cardiovascular;  Laterality: N/A;  . OOPHORECTOMY    . POLYPECTOMY    . PTCA    . VIDEO BRONCHOSCOPY  10/26/2011   Procedure: VIDEO BRONCHOSCOPY WITHOUT FLUORO;  Surgeon: Raylene Miyamoto, MD;  Location: [facility];  Service: Endoscopy;  Laterality: Bilateral;    Current Outpatient Prescriptions  Medication Sig Dispense Refill  . acetaminophen  (TYLENOL) 325 MG tablet Take 650 mg by mouth every 6 (six) hours as needed. For pain    . albuterol (PROVENTIL) (2.5 MG/3ML) 0.083% nebulizer solution Take 3 mLs (2.5 mg total) by nebulization every 6 (six) hours as needed. 75 mL 5  . apixaban (ELIQUIS) 5 MG TABS tablet Take 1 tablet (5 mg total) by mouth 2 (two) times daily. 60 tablet 11  . atorvastatin (LIPITOR) 40 MG tablet Take 1 tablet (40 mg total) by mouth daily. 90 tablet 3  . bisoprolol (ZEBETA) 5 MG tablet Take 1 tablet (5 mg total) by mouth daily. 90 tablet 3  . Blood Glucose Monitoring Suppl (ONE TOUCH ULTRA 2) w/Device KIT Use to check blood sugars daily Dx e11.9 1 each 0  . cetirizine (ZYRTEC) 10 MG tablet Take 1 tablet (10 mg total) by mouth as needed for allergies. 90 tablet 3  . furosemide (LASIX) 40 MG tablet Take 1 tablet (40 mg total) by mouth daily. 90 tablet 3  . glucose blood (ONE TOUCH ULTRA TEST) test strip 1 each by Other route 2 (two) times daily. Use to check blood sugars twice a day Dx E11.9 100 each 2  . ketoconazole (NIZORAL) 2 % cream Apply 1 application topically daily. Under breasts 45 g 1  . Lancets (ONETOUCH ULTRASOFT) lancets 1 each by Other route 2 (two) times daily. Use to check blood sugars twice a day Dx E11.9 100 each 3  . metFORMIN (GLUCOPHAGE) 500 MG tablet Take 1 tablet (500 mg total) by mouth daily with breakfast. 90 tablet 3  . nitroGLYCERIN (NITROSTAT) 0.4 MG SL tablet Place 1 tablet (0.4 mg total) under the tongue every 5 (five) minutes x 3 doses as needed for chest pain. Chest pain 20 tablet 3  . potassium chloride SA (K-DUR,KLOR-CON) 20 MEQ tablet Take 1 tablet (20 mEq total) by mouth daily. 90 tablet 3  . triamcinolone ointment (KENALOG) 0.5 % Apply 1 application topically 2 (two) times daily. On the leg rash 45 g 2  . valsartan (DIOVAN) 80 MG tablet Take 1 tablet (80 mg total) by mouth daily. Yearly physical is due must see MD for future refills 30 tablet 0   No current facility-administered  medications for this visit.     Allergies  Allergen Reactions  . Coreg [Carvedilol]     Severe wheezing   . [name] [Trandolapril]     cough  . Meperidine Hcl Swelling    Mixed with phenergan swelling of the mouth   . Penicillins Other (See Comments)    Pt has taken penicillin about 6 years ago with no side effects  . Molds & Smuts Other (See Comments)    Runny nose  . Tape Other (See Comments)    Redness, pulls skin off  . Tetanus Toxoids Rash    Review of Systems  negative except from HPI and PMH  Physical Exam BP 130/64   Pulse 76   Ht 5' 3" (1.6 m)   Wt 170 lb (77.1 kg)   SpO2 95%   BMI 30.11 kg/m  Well developed and nourished in no acute distress HENT normal Neck supple with JVP flat Clear Device pocket well healed; without hematoma or erythema.  There is no tethering Regular rate and rhythm, no murmurs or gallops Abd-soft with active BS No Clubbing cyanosis edema Skin-warm and dry A & Oriented  Grossly normal sensory and motor function   ECG Personally reviewed  demonstrates AV pacing with  upright QRS in lead V1 negative lead 1    Assessment and  Plan  Atrial fibrillation  Heart Failure-chronic systolic  Hypertension  Ventricular Tachycardia/fibrillation No intercurrent Ventricular tachycardia  Sinus Bradycardia  LV lead threshold-high  Biventricular ICD St Jude The patient's device was interrogated.  The information was reviewed.  Programming of the LV lead is limited to tip-coil ; in other vectors  she either has no capture or diaphragmatic stimulation. Her functional status is stable.  Device is approaching ERI    No intercurrent atrial fibrillation or flutter    Need to check blood work on  anticoagulation   Blood pressures  Are stable  Euvolemic continue current meds   .

## 2016-05-09 NOTE — Patient Instructions (Signed)
Medication Instructions: - Your physician recommends that you continue on your current medications as directed. Please refer to the Current Medication list given to you today.  Labwork: - Your physician recommends that you have lab work today: BMP/CBC   Procedures/Testing: - none ordered  Follow-Up: - Remote monitoring is used to monitor your Pacemaker of ICD from home. This monitoring reduces the number of office visits required to check your device to one time per year. It allows [place] to keep an eye on the functioning of your device to ensure it is working properly. You are scheduled for a device check from home on 08/08/16. You may send your transmission at any time that day. If you have a wireless device, the transmission will be sent automatically. After your physician reviews your transmission, you will receive a postcard with your next transmission date.  - Your physician wants you to follow-up in: 6 months with Tommye Standard, PA for Dr. Caryl Comes. You will receive a reminder letter in the mail two months in advance. If you don't receive a letter, please call our office to schedule the follow-up appointment.   Any Additional Special Instructions Will Be Listed Below (If Applicable). - [facility]- [phone]    If you need a refill on your cardiac medications before your next appointment, please call your pharmacy.

## 2016-05-10 LAB — CBC WITH DIFFERENTIAL/PLATELET
BASOS ABS: 0.1 10*3/uL (ref 0.0–0.2)
Basos: 1 %
EOS (ABSOLUTE): 0.4 10*3/uL (ref 0.0–0.4)
Eos: 5 %
HEMOGLOBIN: 13.2 g/dL (ref 11.1–15.9)
Hematocrit: 39.5 % (ref 34.0–46.6)
Immature Grans (Abs): 0 10*3/uL (ref 0.0–0.1)
Immature Granulocytes: 0 %
LYMPHS ABS: 2.4 10*3/uL (ref 0.7–3.1)
LYMPHS: 26 %
MCH: 27.1 pg (ref 26.6–33.0)
MCHC: 33.4 g/dL (ref 31.5–35.7)
MCV: 81 fL (ref 79–97)
MONOCYTES: 10 %
Monocytes Absolute: 0.9 10*3/uL (ref 0.1–0.9)
Neutrophils Absolute: 5.3 10*3/uL (ref 1.4–7.0)
Neutrophils: 58 %
Platelets: 286 10*3/uL (ref 150–379)
RBC: 4.87 x10E6/uL (ref 3.77–5.28)
RDW: 14.4 % (ref 12.3–15.4)
WBC: 9.1 10*3/uL (ref 3.4–10.8)

## 2016-05-10 LAB — BASIC METABOLIC PANEL
BUN/Creatinine Ratio: 16 (ref 12–28)
BUN: 14 mg/dL (ref 8–27)
CO2: 27 mmol/L (ref 18–29)
CREATININE: 0.86 mg/dL (ref 0.57–1.00)
Calcium: 10 mg/dL (ref 8.7–10.3)
Chloride: 100 mmol/L (ref 96–106)
GFR calc Af Amer: 75 mL/min/{1.73_m2} (ref >59)
GFR calc non Af Amer: 65 mL/min/{1.73_m2} (ref >59)
Glucose: 123 mg/dL — ABNORMAL HIGH (ref 65–99)
Potassium: 4 mmol/L (ref 3.5–5.2)
SODIUM: 140 mmol/L (ref 134–144)

## 2016-05-11 LAB — CUP PACEART INCLINIC DEVICE CHECK
Brady Statistic RV Percent Paced: 97 %
Date Time Interrogation Session: 20180424193640
HighPow Impedance: 73.125
Implantable Lead Implant Date: 20131017
Implantable Lead Implant Date: 20131017
Implantable Lead Location: 753858
Implantable Lead Location: 753859
Implantable Pulse Generator Implant Date: 20131017
Lead Channel Impedance Value: 687.5 Ohm
Lead Channel Pacing Threshold Amplitude: 1 V
Lead Channel Pacing Threshold Pulse Width: 0.5 ms
Lead Channel Sensing Intrinsic Amplitude: 2.8 mV
Lead Channel Setting Pacing Amplitude: 2 V
Lead Channel Setting Pacing Pulse Width: 1 ms
MDC IDC LEAD IMPLANT DT: 20131017
MDC IDC LEAD LOCATION: 753860
MDC IDC MSMT LEADCHNL LV PACING THRESHOLD AMPLITUDE: 3.5 V
MDC IDC MSMT LEADCHNL LV PACING THRESHOLD PULSEWIDTH: 1 ms
MDC IDC MSMT LEADCHNL RA IMPEDANCE VALUE: 400 Ohm
MDC IDC MSMT LEADCHNL RA PACING THRESHOLD AMPLITUDE: 1.25 V
MDC IDC MSMT LEADCHNL RA PACING THRESHOLD PULSEWIDTH: 0.5 ms
MDC IDC MSMT LEADCHNL RV IMPEDANCE VALUE: 350 Ohm
MDC IDC MSMT LEADCHNL RV SENSING INTR AMPL: 11.3 mV
MDC IDC SET LEADCHNL LV PACING AMPLITUDE: 4 V
MDC IDC SET LEADCHNL RA PACING AMPLITUDE: 2.5 V
MDC IDC SET LEADCHNL RV PACING PULSEWIDTH: 0.5 ms
MDC IDC SET LEADCHNL RV SENSING SENSITIVITY: 0.5 mV
MDC IDC STAT BRADY RA PERCENT PACED: 93 %
Pulse Gen Serial Number: 7063327

## 2016-05-15 ENCOUNTER — Encounter: Payer: Medicare HMO | Admitting: Internal Medicine

## 2016-05-15 ENCOUNTER — Encounter: Payer: Self-pay | Admitting: Internal Medicine

## 2016-05-15 ENCOUNTER — Ambulatory Visit (INDEPENDENT_AMBULATORY_CARE_PROVIDER_SITE_OTHER): Payer: Medicare HMO | Admitting: Internal Medicine

## 2016-05-15 VITALS — BP 148/82 | HR 60 | Temp 98.3°F | Ht 63.0 in | Wt 168.0 lb

## 2016-05-15 DIAGNOSIS — I5022 Chronic systolic (congestive) heart failure: Secondary | ICD-10-CM

## 2016-05-15 DIAGNOSIS — I639 Cerebral infarction, unspecified: Secondary | ICD-10-CM | POA: Diagnosis not present

## 2016-05-15 DIAGNOSIS — Z23 Encounter for immunization: Secondary | ICD-10-CM

## 2016-05-15 DIAGNOSIS — E1121 Type 2 diabetes mellitus with diabetic nephropathy: Secondary | ICD-10-CM | POA: Diagnosis not present

## 2016-05-15 DIAGNOSIS — I480 Paroxysmal atrial fibrillation: Secondary | ICD-10-CM

## 2016-05-15 LAB — POCT GLYCOSYLATED HEMOGLOBIN (HGB A1C): Hemoglobin A1C: 6.7

## 2016-05-15 MED ORDER — GLUCOSE BLOOD VI STRP: 1.0000 | ORAL_STRIP | Freq: Every day | 11 refills | Status: DC | PRN

## 2016-05-15 MED ORDER — ONETOUCH ULTRASOFT LANCETS MISC: 1.0000 | Freq: Two times a day (BID) | 3 refills | Status: DC

## 2016-05-15 NOTE — Assessment & Plan Note (Signed)
Valsartan, Zebeta, Furosemide, Eliquis

## 2016-05-15 NOTE — Assessment & Plan Note (Signed)
On Eliquis

## 2016-05-15 NOTE — Progress Notes (Signed)
Subjective:  Patient ID: Alicia Guerrero, female    DOB: 1935-09-28  Age: 81 y.o. MRN: [id number]  CC: No chief complaint on file.   HPI Alicia Guerrero presents for HTN, DM, cardiomyopathy f/u. BP nl at home  Outpatient Medications Prior to Visit  Medication Sig Dispense Refill  . acetaminophen (TYLENOL) 325 MG tablet Take 650 mg by mouth every 6 (six) hours as needed. For pain    . albuterol (PROVENTIL) (2.5 MG/3ML) 0.083% nebulizer solution Take 3 mLs (2.5 mg total) by nebulization every 6 (six) hours as needed. 75 mL 5  . apixaban (ELIQUIS) 5 MG TABS tablet Take 1 tablet (5 mg total) by mouth 2 (two) times daily. 60 tablet 11  . atorvastatin (LIPITOR) 40 MG tablet Take 1 tablet (40 mg total) by mouth daily. 90 tablet 3  . bisoprolol (ZEBETA) 5 MG tablet Take 1 tablet (5 mg total) by mouth daily. 90 tablet 3  . Blood Glucose Monitoring Suppl (ONE TOUCH ULTRA 2) w/Device KIT Use to check blood sugars daily Dx e11.9 1 each 0  . cetirizine (ZYRTEC) 10 MG tablet Take 1 tablet (10 mg total) by mouth as needed for allergies. 90 tablet 3  . furosemide (LASIX) 40 MG tablet Take 1 tablet (40 mg total) by mouth daily. 90 tablet 3  . glucose blood (ONE TOUCH ULTRA TEST) test strip 1 each by Other route 2 (two) times daily. Use to check blood sugars twice a day Dx E11.9 100 each 2  . ketoconazole (NIZORAL) 2 % cream Apply 1 application topically daily. Under breasts 45 g 1  . Lancets (ONETOUCH ULTRASOFT) lancets 1 each by Other route 2 (two) times daily. Use to check blood sugars twice a day Dx E11.9 100 each 3  . metFORMIN (GLUCOPHAGE) 500 MG tablet Take 1 tablet (500 mg total) by mouth daily with breakfast. 90 tablet 3  . nitroGLYCERIN (NITROSTAT) 0.4 MG SL tablet Place 1 tablet (0.4 mg total) under the tongue every 5 (five) minutes x 3 doses as needed for chest pain. Chest pain 20 tablet 3  . potassium chloride SA (K-DUR,KLOR-CON) 20 MEQ tablet Take 1 tablet (20 mEq total) by mouth daily. 90  tablet 3  . triamcinolone ointment (KENALOG) 0.5 % Apply 1 application topically 2 (two) times daily. On the leg rash 45 g 2  . valsartan (DIOVAN) 80 MG tablet Take 1 tablet (80 mg total) by mouth daily. Yearly physical is due must see MD for future refills 30 tablet 0   No facility-administered medications prior to visit.     ROS Review of Systems  Constitutional: Negative for activity change, appetite change, chills, fatigue and unexpected weight change.  HENT: Negative for congestion, mouth sores and sinus pressure.   Eyes: Negative for visual disturbance.  Respiratory: Negative for cough and chest tightness.   Gastrointestinal: Negative for abdominal pain and nausea.  Genitourinary: Negative for difficulty urinating, frequency and vaginal pain.  Musculoskeletal: Positive for back pain. Negative for gait problem.  Skin: Negative for pallor and rash.  Neurological: Negative for dizziness, tremors, weakness, numbness and headaches.  Psychiatric/Behavioral: Negative for confusion, sleep disturbance and suicidal ideas.    Objective:  BP (!) 148/82 (BP Location: Left Arm, Patient Position: Sitting, Cuff Size: Normal)   Pulse 60   Temp 98.3 F (36.8 C) (Oral)   Ht 5' 3" (1.6 m)   Wt 168 lb 0.6 oz (76.2 kg)   SpO2 98%   BMI 29.77 kg/m  BP Readings from Last 3 Encounters:  05/15/16 (!) 148/82  05/09/16 130/64  05/19/15 130/80    Wt Readings from Last 3 Encounters:  05/15/16 168 lb 0.6 oz (76.2 kg)  05/09/16 170 lb (77.1 kg)  05/19/15 177 lb 4 oz (80.4 kg)    Physical Exam  Constitutional: She appears well-developed. No distress.  HENT:  Head: Normocephalic.  Right Ear: External ear normal.  Left Ear: External ear normal.  Nose: Nose normal.  Mouth/Throat: Oropharynx is clear and moist.  Eyes: Conjunctivae are normal. Pupils are equal, round, and reactive to light. Right eye exhibits no discharge. Left eye exhibits no discharge.  Neck: Normal range of motion. Neck  supple. No JVD present. No tracheal deviation present. No thyromegaly present.  Cardiovascular: Normal rate, regular rhythm and normal heart sounds.   Pulmonary/Chest: No stridor. No respiratory distress. She has no wheezes.  Abdominal: Soft. Bowel sounds are normal. She exhibits no distension and no mass. There is no tenderness. There is no rebound and no guarding.  Musculoskeletal: She exhibits no edema or tenderness.  Lymphadenopathy:    She has no cervical adenopathy.  Neurological: She displays normal reflexes. No cranial nerve deficit. She exhibits normal muscle tone. Coordination normal.  Skin: No rash noted. No erythema.  Psychiatric: She has a normal mood and affect. Her behavior is normal. Judgment and thought content normal.    Lab Results  Component Value Date   WBC 9.1 05/09/2016   HGB 13.8 05/19/2015   HCT 39.5 05/09/2016   PLT 286 05/09/2016   GLUCOSE 123 (H) 05/09/2016   CHOL 183 05/19/2015   TRIG 166.0 (H) 05/19/2015   HDL 46.50 05/19/2015   LDLDIRECT 91.1 03/21/2010   LDLCALC 103 (H) 05/19/2015   ALT 15 05/19/2015   AST 17 05/19/2015   NA 140 05/09/2016   K 4.0 05/09/2016   CL 100 05/09/2016   CREATININE 0.86 05/09/2016   BUN 14 05/09/2016   CO2 27 05/09/2016   TSH 2.78 05/19/2015   INR 3.3 12/09/2012   HGBA1C 8.1 (H) 05/19/2015    Dg Hip Unilat With Pelvis 2-3 Views Right  Result Date: 05/14/2014 CLINICAL DATA:  RIGHT hip pain for 1 and half weeks. History of arthritis. EXAM: RIGHT HIP (WITH PELVIS) 2-3 VIEWS COMPARISON:  None. FINDINGS: Panniculus projects over the hip. No fracture. The obturator rings appear within normal limits. There are no marginal osteophytes off the femoral head on the lateral view. There is probably some superior joint space narrowing on the weight-bearing frontal view. IMPRESSION: No acute osseous abnormality.  Question mild joint space narrowing. Electronically Signed   By: Dereck Ligas M.D.   On: 05/14/2014 09:28     Assessment & Plan:   There are no diagnoses linked to this encounter. I am having Ms. Eskridge maintain her acetaminophen, cetirizine, nitroGLYCERIN, albuterol, apixaban, potassium chloride SA, atorvastatin, bisoprolol, furosemide, ketoconazole, triamcinolone ointment, metFORMIN, glucose blood, ONE TOUCH ULTRA 2, onetouch ultrasoft, and valsartan.  No orders of the defined types were placed in this encounter.    Follow-up: No Follow-up on file.  Walker Kehr, MD

## 2016-05-15 NOTE — Progress Notes (Signed)
Pre visit review using our clinic review tool, if applicable. No additional management support is needed unless otherwise documented below in the visit note. 

## 2016-05-15 NOTE — Patient Instructions (Signed)
Hemoglobin A1c Test Some of the sugar (glucose) that circulates in your blood sticks or binds to blood proteins. Hemoglobin (Hb or Hgb) is one type of blood protein that glucose binds to. It also carries oxygen in the red blood cells (RBCs). When glucose binds to Hb, the glucose-coated Hb is called glycated Hb. Once Hb is glycated, it remains that way for the life of the RBC. This is about 120 days. Rather than testing your blood glucose level on one single day, the hemoglobin A1c (HbA1c) test measures the average amount of glycated hemoglobin and, therefore, the average amount of glucose in your blood during the 3-4 months just before the test is done. The HbA1c test is used to monitor long-term control of blood sugar in people who have diabetes mellitus. The HbA1c test can also be used in addition to or in combination with fasting blood glucose level and oral glucose tolerance tests. What do the results mean? It is your responsibility to obtain your test results. Ask the lab or department performing the test when and how you will get your results. Contact your health care provider to discuss any questions you have about your results. Range of Normal Values Ranges for normal values may vary among different labs and hospitals. You should always check with your health care provider after having lab work or other tests done to discuss the meaning of your test results and whether your values are considered within normal limits. The ranges for normal HbA1c test results are as follows:  Adult or child without diabetes: 4-5.9%.  Adult or child with diabetes and good blood glucose control: less than 6.5%.  Several factors can affect HbA1c test results. These may include:  Diseases (hemoglobinopathies) that cause a change in the shape, size, or amount of Hb in your blood.  Longer than normal RBC life span.  Abnormally low levels of certain proteins in your blood.  Eating foods or taking supplements that  are high in vitamin C (ascorbic acid).  Meaning of Results Outside Normal Value Ranges Abnormally high HbA1c values are most commonly an indication of prediabetes mellitus and diabetes mellitus:  An HbA1c result of 5.7-6.4% is considered diagnostic of prediabetes mellitus.  An HbA1c result of 6.5% or higher on two separate occasions is considered diagnostic of diabetes mellitus.  Abnormally low HbA1c values can be caused by several health conditions. These may include:  Pregnancy.  A large amount of blood loss.  Blood transfusions.  Low red blood cell count (anemia). This is caused by premature destruction of red blood cells.  Long-term kidney failure.  Some unusual forms of Hb (Hb variants), such as sickle cell trait.  Discuss your test results with your health care provider. He or she will use the results to make a diagnosis and determine a treatment plan that is right for you. Talk with your health care provider to discuss your results, treatment options, and if necessary, the need for more tests. Talk with your health care provider if you have any questions about your results. This information is not intended to replace advice given to you by your health care provider. Make sure you discuss any questions you have with your health care provider. Document Released: 01/25/2004 Document Revised: 09/29/2015 Document Reviewed: 05/19/2013 Elsevier Interactive Patient Education  2017 Elsevier Inc.  

## 2016-05-15 NOTE — Assessment & Plan Note (Signed)
On Metformin Labs 

## 2016-05-18 ENCOUNTER — Telehealth: Payer: Self-pay

## 2016-05-18 NOTE — Telephone Encounter (Signed)
Pt called back and is aware and agreeable to normal results.

## 2016-05-19 ENCOUNTER — Other Ambulatory Visit: Payer: Self-pay | Admitting: *Deleted

## 2016-05-19 ENCOUNTER — Telehealth: Payer: Self-pay | Admitting: *Deleted

## 2016-05-19 MED ORDER — APIXABAN 5 MG PO TABS: 5.0000 mg | ORAL_TABLET | Freq: Two times a day (BID) | ORAL | 11 refills | Status: DC

## 2016-05-19 MED ORDER — ATORVASTATIN CALCIUM 40 MG PO TABS: 40.0000 mg | ORAL_TABLET | Freq: Every day | ORAL | 3 refills | Status: DC

## 2016-05-19 MED ORDER — BISOPROLOL FUMARATE 5 MG PO TABS: 5.0000 mg | ORAL_TABLET | Freq: Every day | ORAL | 3 refills | Status: DC

## 2016-05-19 MED ORDER — POTASSIUM CHLORIDE CRYS ER 20 MEQ PO TBCR: 20.0000 meq | EXTENDED_RELEASE_TABLET | Freq: Every day | ORAL | 3 refills | Status: DC

## 2016-05-19 MED ORDER — VALSARTAN 80 MG PO TABS: 80.0000 mg | ORAL_TABLET | Freq: Every day | ORAL | 3 refills | Status: DC

## 2016-05-19 MED ORDER — METFORMIN HCL 500 MG PO TABS: 500.0000 mg | ORAL_TABLET | Freq: Every day | ORAL | 3 refills | Status: DC

## 2016-05-19 MED ORDER — FUROSEMIDE 40 MG PO TABS: 40.0000 mg | ORAL_TABLET | Freq: Every day | ORAL | 3 refills | Status: DC

## 2016-05-19 MED ORDER — CETIRIZINE HCL 10 MG PO TABS: 10.0000 mg | ORAL_TABLET | ORAL | 3 refills | Status: AC | PRN

## 2016-05-19 NOTE — Telephone Encounter (Signed)
Spoke with patient regarding receiving manual transmission sent on 03/11/16. Advised patient per Merlin last communication was 05/19/16, therefore her monitor is transmitting properly at this time. Patient verbalized understanding and was appreciative. Patient states no further questions or concerns.

## 2016-05-19 NOTE — Telephone Encounter (Signed)
Rec'd call pt requesting refills on all medications. Need the elaquis to be sent to local pharmacy [facility]. Verified all meds that she is needing sent electronically to [place]...Johny Chess

## 2016-05-22 DIAGNOSIS — R69 Illness, unspecified: Secondary | ICD-10-CM | POA: Diagnosis not present

## 2016-06-21 ENCOUNTER — Other Ambulatory Visit: Payer: Self-pay | Admitting: *Deleted

## 2016-06-21 MED ORDER — APIXABAN 5 MG PO TABS: 5.0000 mg | ORAL_TABLET | Freq: Two times a day (BID) | ORAL | 3 refills | Status: DC

## 2016-06-21 NOTE — Telephone Encounter (Signed)
Rec'd call pt states MD was suppose to refill her eliquis when she saw him back on 4/30, but pharmacy didn't get rx. Inform pt per chart refill was sent on 5/4, not sure why pharmacy told her they didn't get rx, but will resend this am.../lmb

## 2016-06-26 DIAGNOSIS — S90862A Insect bite (nonvenomous), left foot, initial encounter: Secondary | ICD-10-CM | POA: Diagnosis not present

## 2016-07-07 DIAGNOSIS — I25119 Atherosclerotic heart disease of native coronary artery with unspecified angina pectoris: Secondary | ICD-10-CM | POA: Diagnosis not present

## 2016-07-07 DIAGNOSIS — I252 Old myocardial infarction: Secondary | ICD-10-CM | POA: Diagnosis not present

## 2016-07-07 DIAGNOSIS — E7800 Pure hypercholesterolemia, unspecified: Secondary | ICD-10-CM | POA: Diagnosis not present

## 2016-07-07 DIAGNOSIS — E876 Hypokalemia: Secondary | ICD-10-CM | POA: Diagnosis not present

## 2016-07-07 DIAGNOSIS — Z6829 Body mass index (BMI) 29.0-29.9, adult: Secondary | ICD-10-CM | POA: Diagnosis not present

## 2016-07-07 DIAGNOSIS — I4901 Ventricular fibrillation: Secondary | ICD-10-CM | POA: Diagnosis not present

## 2016-07-07 DIAGNOSIS — I110 Hypertensive heart disease with heart failure: Secondary | ICD-10-CM | POA: Diagnosis not present

## 2016-07-07 DIAGNOSIS — Z0000 Encounter for general adult medical examination without abnormal findings: Secondary | ICD-10-CM | POA: Diagnosis not present

## 2016-07-07 DIAGNOSIS — E8770 Fluid overload, unspecified: Secondary | ICD-10-CM | POA: Diagnosis not present

## 2016-07-07 DIAGNOSIS — E669 Obesity, unspecified: Secondary | ICD-10-CM | POA: Diagnosis not present

## 2016-07-07 DIAGNOSIS — G4700 Insomnia, unspecified: Secondary | ICD-10-CM | POA: Diagnosis not present

## 2016-07-10 DIAGNOSIS — R69 Illness, unspecified: Secondary | ICD-10-CM | POA: Diagnosis not present

## 2016-07-31 ENCOUNTER — Telehealth: Payer: Self-pay | Admitting: Internal Medicine

## 2016-07-31 MED ORDER — BISOPROLOL FUMARATE 5 MG PO TABS: 5.0000 mg | ORAL_TABLET | Freq: Every day | ORAL | 2 refills | Status: DC

## 2016-07-31 NOTE — Telephone Encounter (Signed)
Pt needs a refill of bisoprolol (ZEBETA) 5 MG tablet  [facility] delivery  Pt just had yearly in April

## 2016-07-31 NOTE — Telephone Encounter (Signed)
Verified chart sent rx to [facility]...Alicia Guerrero

## 2016-08-08 ENCOUNTER — Telehealth: Payer: Self-pay | Admitting: Internal Medicine

## 2016-08-08 ENCOUNTER — Ambulatory Visit (INDEPENDENT_AMBULATORY_CARE_PROVIDER_SITE_OTHER): Payer: Medicare HMO | Admitting: *Deleted

## 2016-08-08 DIAGNOSIS — I255 Ischemic cardiomyopathy: Secondary | ICD-10-CM

## 2016-08-08 MED ORDER — TELMISARTAN 40 MG PO TABS: 40.0000 mg | ORAL_TABLET | Freq: Every day | ORAL | 11 refills | Status: DC

## 2016-08-08 NOTE — Telephone Encounter (Signed)
Pt called regarding her valsartan (DIOVAN) 80 MG tablet  She would like something else sent in   [facility] mail delivery

## 2016-08-09 MED ORDER — TELMISARTAN 40 MG PO TABS: 40.0000 mg | ORAL_TABLET | Freq: Every day | ORAL | 3 refills | Status: DC

## 2016-08-09 MED ORDER — TELMISARTAN 40 MG PO TABS: 40.0000 mg | ORAL_TABLET | Freq: Every day | ORAL | 0 refills | Status: DC

## 2016-08-09 NOTE — Progress Notes (Signed)
Remote ICD transmission.   

## 2016-08-09 NOTE — Telephone Encounter (Signed)
Notified pt w/MD recommendation. Sent 10 day supply to local [facility] & resent rx to [facility] for 90 day...Alicia Guerrero

## 2016-08-10 ENCOUNTER — Encounter: Payer: Self-pay | Admitting: Cardiology

## 2016-09-07 LAB — CUP PACEART REMOTE DEVICE CHECK
Battery Voltage: 2.74 V
Brady Statistic AP VP Percent: 89 %
Brady Statistic AP VS Percent: 1.1 %
Brady Statistic AS VS Percent: 1 %
Brady Statistic RA Percent Paced: 88 %
Date Time Interrogation Session: 20180724080018
HighPow Impedance: 68 Ohm
HighPow Impedance: 68 Ohm
Implantable Lead Implant Date: 20131017
Implantable Lead Location: 753859
Implantable Pulse Generator Implant Date: 20131017
Lead Channel Impedance Value: 380 Ohm
Lead Channel Impedance Value: 740 Ohm
Lead Channel Pacing Threshold Amplitude: 1 V
Lead Channel Pacing Threshold Amplitude: 3.5 V
Lead Channel Pacing Threshold Pulse Width: 0.5 ms
Lead Channel Pacing Threshold Pulse Width: 0.5 ms
Lead Channel Pacing Threshold Pulse Width: 1 ms
Lead Channel Sensing Intrinsic Amplitude: 12 mV
Lead Channel Sensing Intrinsic Amplitude: 2.7 mV
Lead Channel Setting Pacing Amplitude: 2 V
Lead Channel Setting Pacing Amplitude: 2 V
Lead Channel Setting Pacing Amplitude: 4 V
Lead Channel Setting Pacing Pulse Width: 0.5 ms
Lead Channel Setting Sensing Sensitivity: 0.5 mV
MDC IDC LEAD IMPLANT DT: 20131017
MDC IDC LEAD IMPLANT DT: 20131017
MDC IDC LEAD LOCATION: 753858
MDC IDC LEAD LOCATION: 753860
MDC IDC MSMT BATTERY REMAINING LONGEVITY: 9 mo
MDC IDC MSMT BATTERY REMAINING PERCENTAGE: 16 %
MDC IDC MSMT LEADCHNL RA PACING THRESHOLD AMPLITUDE: 0.875 V
MDC IDC MSMT LEADCHNL RV IMPEDANCE VALUE: 350 Ohm
MDC IDC PG SERIAL: 7063327
MDC IDC SET LEADCHNL LV PACING PULSEWIDTH: 1 ms
MDC IDC STAT BRADY AS VP PERCENT: 6 %

## 2016-09-22 DIAGNOSIS — R69 Illness, unspecified: Secondary | ICD-10-CM | POA: Diagnosis not present

## 2016-10-29 NOTE — Progress Notes (Signed)
Cardiology Office Note Date:  10/30/2016  Patient ID:  Alicia Guerrero [birth year], MRN [id number] PCP:  [name], MD  Cardiologist:  Dr. Caryl Comes     Chief Complaint:  6 month visit  History of Present Illness: Alicia Guerrero is a 81 y.o. female with history of CAD, cardiac arrest w.ICD, PAFib (hx of strokes with supratherapeutic INRs >> eliquis), HTN, HLD, ICM, LBBB, hypothyroidism, breast cancer tx with surgery.  She comes today to be seen for Dr. Caryl Comes, last seen by him in April, at that time doing well without changes made to her tx.  She is feeling well.  Denies any kind of CP, palpitations or SOB.  No symptoms of PND or orthopnea, no dizziness, near syncope or syncope.  She describes herself as active caring for her home, cooking, shopping, and taking care of her husband.  She denies any difficulties with her ADLs.  Denies any bleeding or signs of bleeding.  She has her annual labs lipids managed with her PMD.  Device information: SJM CRT-D, implanted 11/02/11, Dr. Rayann Heman (follows w/Dr. Caryl Comes)   Past Medical History:  Diagnosis Date  . Allergic rhinitis due to pollen   . Asthma   . Atrial fibrillation ([place])   . Biventricular ICD (implantable cardiac defibrillator) in place    St Jude 10/2011 (implant MD: Allred)  . Bradycardia    a. coreg tapered off 05/2011 => b. Metoprolol restarted after cardiac arrest 09/2011  . CAD (coronary artery disease)    a. s/p stent to RCA 1996;  b. s/p cutting balloon PTCA to LAD 2001; c. s/p Cypher DES to LAD 2003;  d. NSTEMI 4/12: DES to dRCA;  e.  [place] 9/13 (after presentation with cardiac arrest and + Nz's for NSTEMI): pLAD 30% ISR, oD1 30%, dLAD 60-70%, pOM1 (small vessel) 90%, oOM2 95% (moderate to large vessel), mCFX 30%, pOM3 30-40%, dRCA stents and pPDA stents patent with 30% ISR => med Rx rec.   . Cancer ([place])   . Cardiac arrest ([place]) 09/2011   a. VT/VF in community; resusc unsuccessful;  PEA in ED; multiple defibs =>  revived;  [facility];  c/b by VDRF, cardiogenic shock;  LHC with stable anatomy => med Rx;  difficult ween from vent => s/p trach;  d/c to SNF  . Chronic eczema    hands  . Chronic systolic heart failure ([place])   . Hemorrhoids   . HLD (hyperlipidemia)   . HTN (hypertension)   . Ischemic cardiomyopathy    a. EF 40% at cath 4/12 with AL and Inf HK;  b.  Echo after presentation with cardiac arrest 9/13:  EF 40-45% and severe inf HK to AK c/w infarction, PASP 42  . Left bundle branch block   . Other and unspecified ovarian cyst   . Other atopic dermatitis and related conditions   . Personal history of hyperthyroidism   . Personal history of malignant neoplasm of breast     Past Surgical History:  Procedure Laterality Date  . ABDOMINAL HYSTERECTOMY    . BI-VENTRICULAR IMPLANTABLE CARDIOVERTER DEFIBRILLATOR N/A 11/02/2011   Procedure: BI-VENTRICULAR IMPLANTABLE CARDIOVERTER DEFIBRILLATOR  (CRT-D);  Surgeon: Thompson Grayer, MD;  Location: [facility] CATH LAB;  Service: Cardiovascular;  Laterality: N/A;  . BREAST LUMPECTOMY  08/24/2003   right lumpectomy+sln,T2N0,ERPR+,Her2-  . CHOLECYSTECTOMY    . COLONOSCOPY    . CORONARY STENT PLACEMENT  may and  july 2012  . LEFT HEART CATHETERIZATION WITH CORONARY ANGIOGRAM N/A 10/09/2011  Procedure: LEFT HEART CATHETERIZATION WITH CORONARY ANGIOGRAM;  Surgeon: Larey Dresser, MD;  Location: [facility] CATH LAB;  Service: Cardiovascular;  Laterality: N/A;  . OOPHORECTOMY    . POLYPECTOMY    . PTCA    . VIDEO BRONCHOSCOPY  10/26/2011   Procedure: VIDEO BRONCHOSCOPY WITHOUT FLUORO;  Surgeon: Raylene Miyamoto, MD;  Location: [facility] ENDOSCOPY;  Service: Endoscopy;  Laterality: Bilateral;    Current Outpatient Prescriptions  Medication Sig Dispense Refill  . acetaminophen (TYLENOL) 325 MG tablet Take 650 mg by mouth every 6 (six) hours as needed. For pain    . albuterol (PROVENTIL) (2.5 MG/3ML) 0.083% nebulizer solution Take 3 mLs (2.5 mg total) by nebulization  every 6 (six) hours as needed. 75 mL 5  . apixaban (ELIQUIS) 5 MG TABS tablet Take 1 tablet (5 mg total) by mouth 2 (two) times daily. 180 tablet 3  . atorvastatin (LIPITOR) 40 MG tablet Take 1 tablet (40 mg total) by mouth daily. 90 tablet 3  . bisoprolol (ZEBETA) 5 MG tablet Take 1 tablet (5 mg total) by mouth daily. 90 tablet 2  . Blood Glucose Monitoring Suppl (ONE TOUCH ULTRA 2) w/Device KIT Use to check blood sugars daily Dx e11.9 1 each 0  . cetirizine (ZYRTEC) 10 MG tablet Take 1 tablet (10 mg total) by mouth as needed for allergies. 90 tablet 3  . furosemide (LASIX) 40 MG tablet Take 1 tablet (40 mg total) by mouth daily. 90 tablet 3  . glucose blood (ONE TOUCH ULTRA TEST) test strip 1 each by Other route daily as needed for other. Use to check blood sugars twice a day Dx E11.9 50 each 11  . ketoconazole (NIZORAL) 2 % cream Apply 1 application topically daily. Under breasts 45 g 1  . Lancets (ONETOUCH ULTRASOFT) lancets 1 each by Other route 2 (two) times daily. Use to check blood sugars twice a day Dx E11.9 100 each 3  . metFORMIN (GLUCOPHAGE) 500 MG tablet Take 1 tablet (500 mg total) by mouth daily with breakfast. 90 tablet 3  . nitroGLYCERIN (NITROSTAT) 0.4 MG SL tablet Place 1 tablet (0.4 mg total) under the tongue every 5 (five) minutes x 3 doses as needed for chest pain. Chest pain 20 tablet 3  . potassium chloride SA (K-DUR,KLOR-CON) 20 MEQ tablet Take 1 tablet (20 mEq total) by mouth daily. 90 tablet 3  . telmisartan (MICARDIS) 40 MG tablet Take 1 tablet (40 mg total) by mouth daily. 90 tablet 3  . triamcinolone ointment (KENALOG) 0.5 % Apply 1 application topically 2 (two) times daily. On the leg rash 45 g 2  . FLUZONE HIGH-DOSE 0.5 ML injection      No current facility-administered medications for this visit.     Allergies:   Coreg [carvedilol]; [name] [trandolapril]; Meperidine hcl; Penicillins; Molds & smuts; Tape; and Tetanus toxoids   Social History:  The patient  reports  that she has never smoked. She has never used smokeless tobacco. She reports that she does not drink alcohol or use drugs.   Family History:  The patient's family history includes Clotting disorder in her brother; Coronary artery disease in her father; Endometrial cancer in her sister; Heart disease in her brother, brother, and sister; Hypertension in her mother; Uterine cancer in her mother.  ROS:  Please see the history of present illness.  All other systems are reviewed and otherwise negative.   PHYSICAL EXAM:  VS:  BP (!) 148/80   Pulse 91   Ht 5'  3" (1.6 m)   Wt 173 lb (78.5 kg)   BMI 30.65 kg/m  BMI: Body mass index is 30.65 kg/m. Well nourished, well developed, in no acute distress  HEENT: normocephalic, atraumatic  Neck: no JVD, carotid bruits or masses Cardiac:  RRR; no significant murmurs, no rubs, or gallops Lungs:  CTA b/l, no wheezing, rhonchi or rales  Abd: soft, nontender MS: no deformity, age appropriate atrophy Ext: no edema  Skin: warm and dry, no rash Neuro:  No gross deficits appreciated Psych: euthymic mood, full affect  ICD site is stable, no tethering or discomfort   EKG:  Not done today ICD interrogation done today and reviewed by myself: battery will be nearing ERI in approx 6 months, lead measurements are stable, LV thresholds chronically elevated, no VT, brief AMS.  CorVue suggests fluid OL  12/05/12: TTE Study Conclusions - Left ventricle: The cavity size was severely dilated. Wall thickness was increased in a pattern of moderate LVH. Systolic function was severely reduced. The estimated ejection fraction was in the range of 25% to 30%. Diffuse hypokinesis. - Right atrium: The atrium was mildly dilated. - Pulmonary arteries: PA peak pressure: 59m Hg (S).  Recent Labs: 05/09/2016: BUN 14; Creatinine, Ser 0.86; Hemoglobin 13.2; Platelets 286; Potassium 4.0; Sodium 140  No results found for requested labs within last 8760 hours.   CrCl  cannot be calculated (Patient's most recent lab result is older than the maximum 21 days allowed.).   Wt Readings from Last 3 Encounters:  10/30/16 173 lb (78.5 kg)  05/15/16 168 lb 0.6 oz (76.2 kg)  05/09/16 170 lb (77.1 kg)     Other studies reviewed: Additional studies/records reviewed today include: summarized above  ASSESSMENT AND PLAN:  1. ICD     Intact function, no changes made, cyber security was done  2. Hx of cardiac arrest:      No VT  3. ICM     Weight is up a 5 lbs from last, corvue suggests fluid OL, no overt symptoms or exam findings of fluid OL though she did have some saltier foods (she says is unusual) the last few days with some soup.     She is asked to take 1 1/2 tabs of lasix x3 days then resume 1 daily     On BB/ARB, lasix tx  4. CAD     No anginal complaints     On BB, statin  5. HTN     No changes today  6. Paroxysmal AFib     CHA2DS2Vasc is at least 5, on Eliquis     BMEt and H/H today      Disposition: F/u with Q 3 month remotes and office in 6 months, sooner if needed.  We discussed by then likely will be close to needing gen change  Current medicines are reviewed at length with the patient today.  The patient did not have any concerns regarding medicines.  SVenetia Night PA-C 10/30/2016 11:24 AM     [place] [phone](office)  [phone](fax)

## 2016-10-30 ENCOUNTER — Ambulatory Visit (INDEPENDENT_AMBULATORY_CARE_PROVIDER_SITE_OTHER): Payer: Medicare HMO | Admitting: Physician Assistant

## 2016-10-30 VITALS — BP 148/80 | HR 91 | Ht 63.0 in | Wt 173.0 lb

## 2016-10-30 DIAGNOSIS — I2510 Atherosclerotic heart disease of native coronary artery without angina pectoris: Secondary | ICD-10-CM

## 2016-10-30 DIAGNOSIS — I480 Paroxysmal atrial fibrillation: Secondary | ICD-10-CM | POA: Diagnosis not present

## 2016-10-30 DIAGNOSIS — I255 Ischemic cardiomyopathy: Secondary | ICD-10-CM | POA: Diagnosis not present

## 2016-10-30 DIAGNOSIS — I10 Essential (primary) hypertension: Secondary | ICD-10-CM

## 2016-10-30 DIAGNOSIS — Z95810 Presence of automatic (implantable) cardiac defibrillator: Secondary | ICD-10-CM

## 2016-10-30 DIAGNOSIS — Z79899 Other long term (current) drug therapy: Secondary | ICD-10-CM

## 2016-10-30 DIAGNOSIS — I4891 Unspecified atrial fibrillation: Secondary | ICD-10-CM | POA: Diagnosis not present

## 2016-10-30 LAB — BASIC METABOLIC PANEL
BUN/Creatinine Ratio: 13 (ref 12–28)
BUN: 13 mg/dL (ref 8–27)
CALCIUM: 10 mg/dL (ref 8.7–10.3)
CHLORIDE: 102 mmol/L (ref 96–106)
CO2: 25 mmol/L (ref 20–29)
CREATININE: 0.99 mg/dL (ref 0.57–1.00)
GFR calc Af Amer: 63 mL/min/{1.73_m2} (ref >59)
GFR calc non Af Amer: 54 mL/min/{1.73_m2} — ABNORMAL LOW (ref >59)
GLUCOSE: 110 mg/dL — AB (ref 65–99)
Potassium: 4.3 mmol/L (ref 3.5–5.2)
Sodium: 144 mmol/L (ref 134–144)

## 2016-10-30 LAB — HEMOGLOBIN AND HEMATOCRIT, BLOOD
HEMOGLOBIN: 12.8 g/dL (ref 11.1–15.9)
Hematocrit: 38.3 % (ref 34.0–46.6)

## 2016-10-30 MED ORDER — APIXABAN 5 MG PO TABS: 5.0000 mg | ORAL_TABLET | Freq: Two times a day (BID) | ORAL | 3 refills | Status: DC

## 2016-10-30 NOTE — Addendum Note (Signed)
Addended by: Claude Manges on: 10/30/2016 01:24 PM   Modules accepted: Orders

## 2016-10-30 NOTE — Patient Instructions (Addendum)
Medication Instructions:   Your physician recommends that you continue on your current medications as directed. Please refer to the Current Medication list given to you today.   If you need a refill on your cardiac medications before your next appointment, please call your pharmacy.  Labwork:  BMET AND H&H TODAY    Testing/Procedures: NONE ORDERED  TODAY    Follow-Up:  Your physician wants you to follow-up in:  [place]  [place] will receive a reminder letter in the mail two months in advance. If you don't receive a letter, please call our office to schedule the follow-up appointment.      Remote monitoring is used to monitor your Pacemaker of ICD from home. This monitoring reduces the number of office visits required to check your device to one time per year. It allows [place] to keep an eye on the functioning of your device to ensure it is working properly. You are scheduled for a device check from home on .11-07-16  You may send your transmission at any time that day. If you have a wireless device, the transmission will be sent automatically. After your physician reviews your transmission, you will receive a postcard with your next transmission date.     Any Other Special Instructions Will Be Listed Below (If Applicable).

## 2016-11-01 ENCOUNTER — Telehealth: Payer: Self-pay | Admitting: Internal Medicine

## 2016-11-01 NOTE — Telephone Encounter (Signed)
The samples will be here today or tomorrow

## 2016-11-01 NOTE — Telephone Encounter (Signed)
Routing to dr Ronnald Ramp, just as a reminder to let tamara know if we can get samples for this, thanks

## 2016-11-01 NOTE — Telephone Encounter (Signed)
Pt cannot afford her apixaban (ELIQUIS) 5 MG TABS tablet She would like to know if we can give her a months worth of samples  She was notified we may not be able to do this, her cardiologists tol her to call us for samples.  Please advise

## 2016-11-01 NOTE — Telephone Encounter (Signed)
Patient advised that we are currently out, but dr Ronnald Ramp is reaching out to rep to see if we can get more brought to our office, I will call patient back

## 2016-11-02 DIAGNOSIS — Z853 Personal history of malignant neoplasm of breast: Secondary | ICD-10-CM | POA: Diagnosis not present

## 2016-11-02 DIAGNOSIS — Z1231 Encounter for screening mammogram for malignant neoplasm of breast: Secondary | ICD-10-CM | POA: Diagnosis not present

## 2016-11-02 DIAGNOSIS — C50411 Malignant neoplasm of upper-outer quadrant of right female breast: Secondary | ICD-10-CM | POA: Diagnosis not present

## 2016-11-02 LAB — HM MAMMOGRAPHY

## 2016-11-07 ENCOUNTER — Ambulatory Visit (INDEPENDENT_AMBULATORY_CARE_PROVIDER_SITE_OTHER): Payer: Medicare HMO | Admitting: *Deleted

## 2016-11-07 DIAGNOSIS — I255 Ischemic cardiomyopathy: Secondary | ICD-10-CM

## 2016-11-08 NOTE — Progress Notes (Signed)
Remote ICD transmission.   

## 2016-11-09 LAB — CUP PACEART REMOTE DEVICE CHECK
Battery Remaining Percentage: 9 %
Battery Voltage: 2.68 V
Brady Statistic AP VP Percent: 94 %
Brady Statistic AS VP Percent: 3.8 %
Brady Statistic RA Percent Paced: 94 %
HIGH POWER IMPEDANCE MEASURED VALUE: 82 Ohm
HighPow Impedance: 82 Ohm
Implantable Lead Implant Date: 20131017
Implantable Lead Location: 753859
Lead Channel Impedance Value: 350 Ohm
Lead Channel Pacing Threshold Amplitude: 0.875 V
Lead Channel Pacing Threshold Amplitude: 0.875 V
Lead Channel Pacing Threshold Pulse Width: 0.5 ms
Lead Channel Pacing Threshold Pulse Width: 1 ms
Lead Channel Sensing Intrinsic Amplitude: 11.3 mV
Lead Channel Setting Pacing Amplitude: 2 V
Lead Channel Setting Pacing Pulse Width: 1 ms
MDC IDC LEAD IMPLANT DT: 20131017
MDC IDC LEAD IMPLANT DT: 20131017
MDC IDC LEAD LOCATION: 753858
MDC IDC LEAD LOCATION: 753860
MDC IDC MSMT BATTERY REMAINING LONGEVITY: 5 mo
MDC IDC MSMT LEADCHNL LV IMPEDANCE VALUE: 730 Ohm
MDC IDC MSMT LEADCHNL LV PACING THRESHOLD AMPLITUDE: 3.75 V
MDC IDC MSMT LEADCHNL RA IMPEDANCE VALUE: 380 Ohm
MDC IDC MSMT LEADCHNL RA PACING THRESHOLD PULSEWIDTH: 0.5 ms
MDC IDC MSMT LEADCHNL RA SENSING INTR AMPL: 2.6 mV
MDC IDC PG IMPLANT DT: 20131017
MDC IDC PG SERIAL: 7063327
MDC IDC SESS DTM: 20181023060017
MDC IDC SET LEADCHNL LV PACING AMPLITUDE: 4 V
MDC IDC SET LEADCHNL RV PACING AMPLITUDE: 2 V
MDC IDC SET LEADCHNL RV PACING PULSEWIDTH: 0.5 ms
MDC IDC SET LEADCHNL RV SENSING SENSITIVITY: 0.5 mV
MDC IDC STAT BRADY AP VS PERCENT: 1 %
MDC IDC STAT BRADY AS VS PERCENT: 1 %

## 2016-11-10 ENCOUNTER — Encounter: Payer: Self-pay | Admitting: Cardiology

## 2016-12-15 ENCOUNTER — Telehealth: Payer: Self-pay | Admitting: Internal Medicine

## 2016-12-15 NOTE — Telephone Encounter (Signed)
Please advise 

## 2016-12-15 NOTE — Telephone Encounter (Signed)
Copied from [place] [id number]. Topic: Quick Communication - See Telephone Encounter >> Dec 15, 2016  2:04 PM Conception Chancy, NT wrote: CRM for notification. See Telephone encounter for:  12/15/16.  Pt states she is out of eliquis but does not have the money to pay for them and would like [name] nurse to give her a call to discuss what she should do

## 2016-12-15 NOTE — Telephone Encounter (Signed)
Patient called the office and requested eliquis samples as she is in the donut hole. She is aware that we would not be able to provide her with enough samples to last her until January but that I could place two weeks at the front desk. I also explained patient assistance to her as well. Patient verbalized her understanding and was very appreciative for samples.

## 2017-02-06 ENCOUNTER — Ambulatory Visit (INDEPENDENT_AMBULATORY_CARE_PROVIDER_SITE_OTHER): Payer: Medicare HMO | Admitting: *Deleted

## 2017-02-06 DIAGNOSIS — I255 Ischemic cardiomyopathy: Secondary | ICD-10-CM

## 2017-02-06 NOTE — Progress Notes (Signed)
Remote ICD transmission.   

## 2017-02-07 ENCOUNTER — Encounter: Payer: Self-pay | Admitting: Cardiology

## 2017-03-05 ENCOUNTER — Telehealth: Payer: Self-pay | Admitting: Internal Medicine

## 2017-03-05 LAB — CUP PACEART REMOTE DEVICE CHECK
Battery Remaining Longevity: 4 mo
Battery Remaining Percentage: 6 %
Battery Voltage: 2.63 V
Brady Statistic AP VP Percent: 91 %
Brady Statistic AS VP Percent: 5.5 %
Brady Statistic RA Percent Paced: 91 %
HIGH POWER IMPEDANCE MEASURED VALUE: 71 Ohm
HighPow Impedance: 71 Ohm
Implantable Lead Implant Date: 20131017
Implantable Lead Implant Date: 20131017
Implantable Lead Implant Date: 20131017
Implantable Lead Location: 753859
Lead Channel Impedance Value: 350 Ohm
Lead Channel Pacing Threshold Amplitude: 0.875 V
Lead Channel Pacing Threshold Amplitude: 3.75 V
Lead Channel Pacing Threshold Pulse Width: 0.5 ms
Lead Channel Pacing Threshold Pulse Width: 1 ms
Lead Channel Sensing Intrinsic Amplitude: 11.3 mV
Lead Channel Setting Pacing Amplitude: 2 V
Lead Channel Setting Sensing Sensitivity: 0.5 mV
MDC IDC LEAD LOCATION: 753858
MDC IDC LEAD LOCATION: 753860
MDC IDC MSMT LEADCHNL LV IMPEDANCE VALUE: 760 Ohm
MDC IDC MSMT LEADCHNL RA IMPEDANCE VALUE: 390 Ohm
MDC IDC MSMT LEADCHNL RA PACING THRESHOLD AMPLITUDE: 1 V
MDC IDC MSMT LEADCHNL RA PACING THRESHOLD PULSEWIDTH: 0.5 ms
MDC IDC MSMT LEADCHNL RA SENSING INTR AMPL: 2.3 mV
MDC IDC PG IMPLANT DT: 20131017
MDC IDC PG SERIAL: 7063327
MDC IDC SESS DTM: 20190122090017
MDC IDC SET LEADCHNL LV PACING AMPLITUDE: 4 V
MDC IDC SET LEADCHNL LV PACING PULSEWIDTH: 1 ms
MDC IDC SET LEADCHNL RV PACING AMPLITUDE: 2 V
MDC IDC SET LEADCHNL RV PACING PULSEWIDTH: 0.5 ms
MDC IDC STAT BRADY AP VS PERCENT: 1 %
MDC IDC STAT BRADY AS VS PERCENT: 1 %

## 2017-03-05 NOTE — Telephone Encounter (Signed)
Patient declined AWV at this time.  

## 2017-03-28 DIAGNOSIS — H26493 Other secondary cataract, bilateral: Secondary | ICD-10-CM | POA: Diagnosis not present

## 2017-03-28 DIAGNOSIS — H353131 Nonexudative age-related macular degeneration, bilateral, early dry stage: Secondary | ICD-10-CM | POA: Diagnosis not present

## 2017-04-11 DIAGNOSIS — H26492 Other secondary cataract, left eye: Secondary | ICD-10-CM | POA: Diagnosis not present

## 2017-04-16 ENCOUNTER — Telehealth: Payer: Self-pay | Admitting: *Deleted

## 2017-04-16 NOTE — Telephone Encounter (Signed)
[facility] home monitoring alert- ICD RRT 04/14/17. Pt made aware. I will have scheduling call to arrange f/u with SK/RU/AS to arrange generator replacement. She verbalizes understanding and will be unable to do anything this Wednesday due to an eye procedure. I let her know that it will likely be later than that.

## 2017-04-18 ENCOUNTER — Encounter: Payer: Medicare HMO | Admitting: Nurse Practitioner

## 2017-04-18 DIAGNOSIS — H26491 Other secondary cataract, right eye: Secondary | ICD-10-CM | POA: Diagnosis not present

## 2017-04-18 NOTE — H&P (View-Only) (Signed)
Electrophysiology Office Note Date: 04/19/2017  ID:  Alicia Guerrero, DOB [birth year], MRN [id number]  PCP: [name], MD Electrophysiologist: Caryl Comes  CC: ICD at [place] is a 82 y.o. female seen today for Dr Caryl Comes.  She presents today for electrophysiology followup after her device was found to be at [facility].   Since last being seen in our clinic, the patient reports doing very well. She denies chest pain, palpitations, dyspnea, PND, orthopnea, nausea, vomiting, dizziness, syncope, edema, weight gain, or early satiety.  She has not had ICD shocks.   Device History: STJ CRTD implanted 2013 for ICM, CHF History of appropriate therapy: No History of AAD therapy: No   Past Medical History:  Diagnosis Date  . Allergic rhinitis due to pollen   . Asthma   . Atrial fibrillation ([place])   . Biventricular ICD (implantable cardiac defibrillator) in place    St Jude 10/2011 (implant MD: Allred)  . Bradycardia    a. coreg tapered off 05/2011 => b. Metoprolol restarted after cardiac arrest 09/2011  . CAD (coronary artery disease)    a. s/p stent to RCA 1996;  b. s/p cutting balloon PTCA to LAD 2001; c. s/p Cypher DES to LAD 2003;  d. NSTEMI 4/12: DES to dRCA;  e.  [place] 9/13 (after presentation with cardiac arrest and + Nz's for NSTEMI): pLAD 30% ISR, oD1 30%, dLAD 60-70%, pOM1 (small vessel) 90%, oOM2 95% (moderate to large vessel), mCFX 30%, pOM3 30-40%, dRCA stents and pPDA stents patent with 30% ISR => med Rx rec.   . Cancer ([place])   . Cardiac arrest ([place]) 09/2011   a. VT/VF in community; resusc unsuccessful;  PEA in ED; multiple defibs => revived;  [employer];  c/b by VDRF, cardiogenic shock;  LHC with stable anatomy => med Rx;  difficult ween from vent => s/p trach;  d/c to SNF  . Chronic eczema    hands  . Chronic systolic heart failure ([place])   . Hemorrhoids   . HLD (hyperlipidemia)   . HTN (hypertension)   . Ischemic cardiomyopathy    a. EF 40% at cath 4/12 with  AL and Inf HK;  b.  Echo after presentation with cardiac arrest 9/13:  EF 40-45% and severe inf HK to AK c/w infarction, PASP 42  . Left bundle branch block   . Other and unspecified ovarian cyst   . Other atopic dermatitis and related conditions   . Personal history of hyperthyroidism   . Personal history of malignant neoplasm of breast    Past Surgical History:  Procedure Laterality Date  . ABDOMINAL HYSTERECTOMY    . BI-VENTRICULAR IMPLANTABLE CARDIOVERTER DEFIBRILLATOR N/A 11/02/2011   Procedure: BI-VENTRICULAR IMPLANTABLE CARDIOVERTER DEFIBRILLATOR  (CRT-D);  Surgeon: Thompson Grayer, MD;  Location: [facility] CATH LAB;  Service: Cardiovascular;  Laterality: N/A;  . BREAST LUMPECTOMY  08/24/2003   right lumpectomy+sln,T2N0,ERPR+,Her2-  . CHOLECYSTECTOMY    . COLONOSCOPY    . CORONARY STENT PLACEMENT  may and  july 2012  . LEFT HEART CATHETERIZATION WITH CORONARY ANGIOGRAM N/A 10/09/2011   Procedure: LEFT HEART CATHETERIZATION WITH CORONARY ANGIOGRAM;  Surgeon: Larey Dresser, MD;  Location: [facility] CATH LAB;  Service: Cardiovascular;  Laterality: N/A;  . OOPHORECTOMY    . POLYPECTOMY    . PTCA    . VIDEO BRONCHOSCOPY  10/26/2011   Procedure: VIDEO BRONCHOSCOPY WITHOUT FLUORO;  Surgeon: Raylene Miyamoto, MD;  Location: [facility] ENDOSCOPY;  Service: Endoscopy;  Laterality:  Bilateral;    Current Outpatient Medications  Medication Sig Dispense Refill  . acetaminophen (TYLENOL) 325 MG tablet Take 650 mg by mouth every 6 (six) hours as needed. For pain    . albuterol (PROVENTIL) (2.5 MG/3ML) 0.083% nebulizer solution Take 3 mLs (2.5 mg total) by nebulization every 6 (six) hours as needed. 75 mL 5  . apixaban (ELIQUIS) 5 MG TABS tablet Take 1 tablet (5 mg total) by mouth 2 (two) times daily. 180 tablet 3  . atorvastatin (LIPITOR) 40 MG tablet Take 1 tablet (40 mg total) by mouth daily. 90 tablet 3  . bisoprolol (ZEBETA) 5 MG tablet Take 1 tablet (5 mg total) by mouth daily. 90 tablet 2  . Blood Glucose  Monitoring Suppl (ONE TOUCH ULTRA 2) w/Device KIT Use to check blood sugars daily Dx e11.9 1 each 0  . cetirizine (ZYRTEC) 10 MG tablet Take 1 tablet (10 mg total) by mouth as needed for allergies. 90 tablet 3  . FLUZONE HIGH-DOSE 0.5 ML injection     . furosemide (LASIX) 40 MG tablet Take 1 tablet (40 mg total) by mouth daily. 90 tablet 3  . glucose blood (ONE TOUCH ULTRA TEST) test strip 1 each by Other route daily as needed for other. Use to check blood sugars twice a day Dx E11.9 50 each 11  . ketoconazole (NIZORAL) 2 % cream Apply 1 application topically daily. Under breasts 45 g 1  . Lancets (ONETOUCH ULTRASOFT) lancets 1 each by Other route 2 (two) times daily. Use to check blood sugars twice a day Dx E11.9 100 each 3  . metFORMIN (GLUCOPHAGE) 500 MG tablet Take 1 tablet (500 mg total) by mouth daily with breakfast. 90 tablet 3  . nitroGLYCERIN (NITROSTAT) 0.4 MG SL tablet Place 1 tablet (0.4 mg total) under the tongue every 5 (five) minutes x 3 doses as needed for chest pain. Chest pain 20 tablet 3  . potassium chloride SA (K-DUR,KLOR-CON) 20 MEQ tablet Take 1 tablet (20 mEq total) by mouth daily. 90 tablet 3  . telmisartan (MICARDIS) 40 MG tablet Take 1 tablet (40 mg total) by mouth daily. 90 tablet 3  . triamcinolone ointment (KENALOG) 0.5 % Apply 1 application topically 2 (two) times daily. On the leg rash 45 g 2   No current facility-administered medications for this visit.     Allergies:   Coreg [carvedilol]; [name] [trandolapril]; Meperidine hcl; Penicillins; Molds & smuts; Tape; and Tetanus toxoids   Social History: Social History   Socioeconomic History  . Marital status: Married    Spouse name: Not on file  . Number of children: 2  . Years of education: Not on file  . Highest education level: Not on file  Occupational History  . Occupation: Retired   Scientific laboratory technician  . Financial resource strain: Not on file  . Food insecurity:    Worry: Not on file    Inability: Not on  file  . Transportation needs:    Medical: Not on file    Non-medical: Not on file  Tobacco Use  . Smoking status: Never Smoker  . Smokeless tobacco: Never Used  Substance and Sexual Activity  . Alcohol use: No    Alcohol/week: 0.0 oz  . Drug use: No  . Sexual activity: Not on file  Lifestyle  . Physical activity:    Days per week: Not on file    Minutes per session: Not on file  . Stress: Not on file  Relationships  . Social  connections:    Talks on phone: Not on file    Gets together: Not on file    Attends religious service: Not on file    Active member of club or organization: Not on file    Attends meetings of clubs or organizations: Not on file    Relationship status: Not on file  . Intimate partner violence:    Fear of current or ex partner: Not on file    Emotionally abused: Not on file    Physically abused: Not on file    Forced sexual activity: Not on file  Other Topics Concern  . Not on file  Social History Narrative   0 caffeine drinks daily     Family History: Family History  Problem Relation Age of Onset  . Uterine cancer Mother   . Hypertension Mother   . Coronary artery disease Father   . Heart disease Brother   . Heart disease Sister   . Endometrial cancer Sister   . Heart disease Brother   . Clotting disorder Brother        blood clot  . Colon cancer Neg Hx     Review of Systems: All other systems reviewed and are otherwise negative except as noted above.   Physical Exam: VS:  BP 124/88   Pulse 62   Ht _0  (1.6 m)   Wt 175 lb (79.4 kg)   SpO2 95%   BMI 31.00 kg/m  , BMI Body mass index is 31 kg/m.  GEN- The patient is elderly appearing, alert and oriented x 3 today.   HEENT: normocephalic, atraumatic; sclera clear, conjunctiva pink; hearing intact; oropharynx clear; neck supple  Lungs- Clear to ausculation bilaterally, normal work of breathing.  No wheezes, rales, rhonchi Heart- Regular rate and rhythm (paced) GI- soft,  non-tender, non-distended, bowel sounds present, no hepatosplenomegaly Extremities- no clubbing, cyanosis, or edema  MS- no significant deformity or atrophy Skin- warm and dry, no rash or lesion; ICD pocket well healed Psych- euthymic mood, full affect Neuro- strength and sensation are intact  ICD interrogation- reviewed in detail today,  See PACEART report  EKG:  EKG is ordered today. The ekg ordered today shows sinus rhythm with V pacing  Recent Labs: 05/09/2016: Platelets 286 10/30/2016: BUN 13; Creatinine, Ser 0.99; Hemoglobin 12.8; Potassium 4.3; Sodium 144   Wt Readings from Last 3 Encounters:  04/19/17 175 lb (79.4 kg)  10/30/16 173 lb (78.5 kg)  05/15/16 168 lb 0.6 oz (76.2 kg)     Other studies Reviewed: Additional studies/ records that were reviewed today include: prior office notes   Assessment and Plan:  1.  Chronic systolic dysfunction euvolemic today Stable on an appropriate medical regimen ICD at [facility]. Risks, benefits to gen change reviewed with patient who wishes to proceed. Will plan with Dr Caryl Comes at next available time. Hold Eliquis for 2 days prior to procedure  Update echo prior to gen change  2.  Prior cardiac arrest No recurrent ventricular arrhythmias  3.  CAD No recent ischemic symptoms Continue current therapy  4.  Paroxysmal atrial fibrillation Burden by device interrogation 0% Continue Eliquis for CHADS2VASC of 5  5.  HTN Stable No change required today    Current medicines are reviewed at length with the patient today.   The patient does not have concerns regarding her medicines.  The following changes were made today:  none  Labs/ tests ordered today include: pre-procedure labs, echo  Orders Placed This Encounter  Procedures  .  Basic metabolic panel  . CBC  . CUP PACEART [place]  . EKG 12-Lead     Disposition:   Follow up with Dr Caryl Comes after gen change      Signed, Chanetta Marshall, NP 04/19/2017 12:35 PM  [place] [address] [phone] (office) [phone] (fax)

## 2017-04-18 NOTE — Progress Notes (Signed)
Electrophysiology Office Note Date: 04/19/2017  ID:  Alicia Guerrero, DOB [birth year], MRN [id number]  PCP: [name], MD Electrophysiologist: Caryl Comes  CC: ICD at [place] is a 82 y.o. female seen today for Dr Caryl Comes.  She presents today for electrophysiology followup after her device was found to be at [facility].   Since last being seen in our clinic, the patient reports doing very well. She denies chest pain, palpitations, dyspnea, PND, orthopnea, nausea, vomiting, dizziness, syncope, edema, weight gain, or early satiety.  She has not had ICD shocks.   Device History: STJ CRTD implanted 2013 for ICM, CHF History of appropriate therapy: No History of AAD therapy: No   Past Medical History:  Diagnosis Date  . Allergic rhinitis due to pollen   . Asthma   . Atrial fibrillation ([place])   . Biventricular ICD (implantable cardiac defibrillator) in place    St Jude 10/2011 (implant MD: Allred)  . Bradycardia    a. coreg tapered off 05/2011 => b. Metoprolol restarted after cardiac arrest 09/2011  . CAD (coronary artery disease)    a. s/p stent to RCA 1996;  b. s/p cutting balloon PTCA to LAD 2001; c. s/p Cypher DES to LAD 2003;  d. NSTEMI 4/12: DES to dRCA;  e.  [place] 9/13 (after presentation with cardiac arrest and + Nz's for NSTEMI): pLAD 30% ISR, oD1 30%, dLAD 60-70%, pOM1 (small vessel) 90%, oOM2 95% (moderate to large vessel), mCFX 30%, pOM3 30-40%, dRCA stents and pPDA stents patent with 30% ISR => med Rx rec.   . Cancer ([place])   . Cardiac arrest ([place]) 09/2011   a. VT/VF in community; resusc unsuccessful;  PEA in ED; multiple defibs => revived;  [employer];  c/b by VDRF, cardiogenic shock;  LHC with stable anatomy => med Rx;  difficult ween from vent => s/p trach;  d/c to SNF  . Chronic eczema    hands  . Chronic systolic heart failure ([place])   . Hemorrhoids   . HLD (hyperlipidemia)   . HTN (hypertension)   . Ischemic cardiomyopathy    a. EF 40% at cath 4/12 with  AL and Inf HK;  b.  Echo after presentation with cardiac arrest 9/13:  EF 40-45% and severe inf HK to AK c/w infarction, PASP 42  . Left bundle branch block   . Other and unspecified ovarian cyst   . Other atopic dermatitis and related conditions   . Personal history of hyperthyroidism   . Personal history of malignant neoplasm of breast    Past Surgical History:  Procedure Laterality Date  . ABDOMINAL HYSTERECTOMY    . BI-VENTRICULAR IMPLANTABLE CARDIOVERTER DEFIBRILLATOR N/A 11/02/2011   Procedure: BI-VENTRICULAR IMPLANTABLE CARDIOVERTER DEFIBRILLATOR  (CRT-D);  Surgeon: Thompson Grayer, MD;  Location: [facility] CATH LAB;  Service: Cardiovascular;  Laterality: N/A;  . BREAST LUMPECTOMY  08/24/2003   right lumpectomy+sln,T2N0,ERPR+,Her2-  . CHOLECYSTECTOMY    . COLONOSCOPY    . CORONARY STENT PLACEMENT  may and  july 2012  . LEFT HEART CATHETERIZATION WITH CORONARY ANGIOGRAM N/A 10/09/2011   Procedure: LEFT HEART CATHETERIZATION WITH CORONARY ANGIOGRAM;  Surgeon: Larey Dresser, MD;  Location: [facility] CATH LAB;  Service: Cardiovascular;  Laterality: N/A;  . OOPHORECTOMY    . POLYPECTOMY    . PTCA    . VIDEO BRONCHOSCOPY  10/26/2011   Procedure: VIDEO BRONCHOSCOPY WITHOUT FLUORO;  Surgeon: Raylene Miyamoto, MD;  Location: [facility] ENDOSCOPY;  Service: Endoscopy;  Laterality:  Bilateral;    Current Outpatient Medications  Medication Sig Dispense Refill  . acetaminophen (TYLENOL) 325 MG tablet Take 650 mg by mouth every 6 (six) hours as needed. For pain    . albuterol (PROVENTIL) (2.5 MG/3ML) 0.083% nebulizer solution Take 3 mLs (2.5 mg total) by nebulization every 6 (six) hours as needed. 75 mL 5  . apixaban (ELIQUIS) 5 MG TABS tablet Take 1 tablet (5 mg total) by mouth 2 (two) times daily. 180 tablet 3  . atorvastatin (LIPITOR) 40 MG tablet Take 1 tablet (40 mg total) by mouth daily. 90 tablet 3  . bisoprolol (ZEBETA) 5 MG tablet Take 1 tablet (5 mg total) by mouth daily. 90 tablet 2  . Blood Glucose  Monitoring Suppl (ONE TOUCH ULTRA 2) w/Device KIT Use to check blood sugars daily Dx e11.9 1 each 0  . cetirizine (ZYRTEC) 10 MG tablet Take 1 tablet (10 mg total) by mouth as needed for allergies. 90 tablet 3  . FLUZONE HIGH-DOSE 0.5 ML injection     . furosemide (LASIX) 40 MG tablet Take 1 tablet (40 mg total) by mouth daily. 90 tablet 3  . glucose blood (ONE TOUCH ULTRA TEST) test strip 1 each by Other route daily as needed for other. Use to check blood sugars twice a day Dx E11.9 50 each 11  . ketoconazole (NIZORAL) 2 % cream Apply 1 application topically daily. Under breasts 45 g 1  . Lancets (ONETOUCH ULTRASOFT) lancets 1 each by Other route 2 (two) times daily. Use to check blood sugars twice a day Dx E11.9 100 each 3  . metFORMIN (GLUCOPHAGE) 500 MG tablet Take 1 tablet (500 mg total) by mouth daily with breakfast. 90 tablet 3  . nitroGLYCERIN (NITROSTAT) 0.4 MG SL tablet Place 1 tablet (0.4 mg total) under the tongue every 5 (five) minutes x 3 doses as needed for chest pain. Chest pain 20 tablet 3  . potassium chloride SA (K-DUR,KLOR-CON) 20 MEQ tablet Take 1 tablet (20 mEq total) by mouth daily. 90 tablet 3  . telmisartan (MICARDIS) 40 MG tablet Take 1 tablet (40 mg total) by mouth daily. 90 tablet 3  . triamcinolone ointment (KENALOG) 0.5 % Apply 1 application topically 2 (two) times daily. On the leg rash 45 g 2   No current facility-administered medications for this visit.     Allergies:   Coreg [carvedilol]; [name] [trandolapril]; Meperidine hcl; Penicillins; Molds & smuts; Tape; and Tetanus toxoids   Social History: Social History   Socioeconomic History  . Marital status: Married    Spouse name: Not on file  . Number of children: 2  . Years of education: Not on file  . Highest education level: Not on file  Occupational History  . Occupation: Retired   Scientific laboratory technician  . Financial resource strain: Not on file  . Food insecurity:    Worry: Not on file    Inability: Not on  file  . Transportation needs:    Medical: Not on file    Non-medical: Not on file  Tobacco Use  . Smoking status: Never Smoker  . Smokeless tobacco: Never Used  Substance and Sexual Activity  . Alcohol use: No    Alcohol/week: 0.0 oz  . Drug use: No  . Sexual activity: Not on file  Lifestyle  . Physical activity:    Days per week: Not on file    Minutes per session: Not on file  . Stress: Not on file  Relationships  . Social  connections:    Talks on phone: Not on file    Gets together: Not on file    Attends religious service: Not on file    Active member of club or organization: Not on file    Attends meetings of clubs or organizations: Not on file    Relationship status: Not on file  . Intimate partner violence:    Fear of current or ex partner: Not on file    Emotionally abused: Not on file    Physically abused: Not on file    Forced sexual activity: Not on file  Other Topics Concern  . Not on file  Social History Narrative   0 caffeine drinks daily     Family History: Family History  Problem Relation Age of Onset  . Uterine cancer Mother   . Hypertension Mother   . Coronary artery disease Father   . Heart disease Brother   . Heart disease Sister   . Endometrial cancer Sister   . Heart disease Brother   . Clotting disorder Brother        blood clot  . Colon cancer Neg Hx     Review of Systems: All other systems reviewed and are otherwise negative except as noted above.   Physical Exam: VS:  BP 124/88   Pulse 62   Ht _0  (1.6 m)   Wt 175 lb (79.4 kg)   SpO2 95%   BMI 31.00 kg/m  , BMI Body mass index is 31 kg/m.  GEN- The patient is elderly appearing, alert and oriented x 3 today.   HEENT: normocephalic, atraumatic; sclera clear, conjunctiva pink; hearing intact; oropharynx clear; neck supple  Lungs- Clear to ausculation bilaterally, normal work of breathing.  No wheezes, rales, rhonchi Heart- Regular rate and rhythm (paced) GI- soft,  non-tender, non-distended, bowel sounds present, no hepatosplenomegaly Extremities- no clubbing, cyanosis, or edema  MS- no significant deformity or atrophy Skin- warm and dry, no rash or lesion; ICD pocket well healed Psych- euthymic mood, full affect Neuro- strength and sensation are intact  ICD interrogation- reviewed in detail today,  See PACEART report  EKG:  EKG is ordered today. The ekg ordered today shows sinus rhythm with V pacing  Recent Labs: 05/09/2016: Platelets 286 10/30/2016: BUN 13; Creatinine, Ser 0.99; Hemoglobin 12.8; Potassium 4.3; Sodium 144   Wt Readings from Last 3 Encounters:  04/19/17 175 lb (79.4 kg)  10/30/16 173 lb (78.5 kg)  05/15/16 168 lb 0.6 oz (76.2 kg)     Other studies Reviewed: Additional studies/ records that were reviewed today include: prior office notes   Assessment and Plan:  1.  Chronic systolic dysfunction euvolemic today Stable on an appropriate medical regimen ICD at [facility]. Risks, benefits to gen change reviewed with patient who wishes to proceed. Will plan with Dr Caryl Comes at next available time. Hold Eliquis for 2 days prior to procedure  Update echo prior to gen change  2.  Prior cardiac arrest No recurrent ventricular arrhythmias  3.  CAD No recent ischemic symptoms Continue current therapy  4.  Paroxysmal atrial fibrillation Burden by device interrogation 0% Continue Eliquis for CHADS2VASC of 5  5.  HTN Stable No change required today    Current medicines are reviewed at length with the patient today.   The patient does not have concerns regarding her medicines.  The following changes were made today:  none  Labs/ tests ordered today include: pre-procedure labs, echo  Orders Placed This Encounter  Procedures  .  Basic metabolic panel  . CBC  . CUP PACEART [place]  . EKG 12-Lead     Disposition:   Follow up with Dr Caryl Comes after gen change      Signed, Chanetta Marshall, NP 04/19/2017 12:35 PM  [place] [address] [phone] (office) [phone] (fax)

## 2017-04-19 ENCOUNTER — Encounter: Payer: Self-pay | Admitting: *Deleted

## 2017-04-19 ENCOUNTER — Ambulatory Visit: Payer: Medicare HMO | Admitting: Nurse Practitioner

## 2017-04-19 ENCOUNTER — Encounter: Payer: Self-pay | Admitting: Nurse Practitioner

## 2017-04-19 ENCOUNTER — Encounter: Payer: Medicare HMO | Admitting: Nurse Practitioner

## 2017-04-19 VITALS — BP 124/88 | HR 62 | Ht 63.0 in | Wt 175.0 lb

## 2017-04-19 DIAGNOSIS — I5022 Chronic systolic (congestive) heart failure: Secondary | ICD-10-CM | POA: Diagnosis not present

## 2017-04-19 DIAGNOSIS — I10 Essential (primary) hypertension: Secondary | ICD-10-CM

## 2017-04-19 DIAGNOSIS — I480 Paroxysmal atrial fibrillation: Secondary | ICD-10-CM | POA: Diagnosis not present

## 2017-04-19 DIAGNOSIS — I4901 Ventricular fibrillation: Secondary | ICD-10-CM | POA: Diagnosis not present

## 2017-04-19 DIAGNOSIS — I2510 Atherosclerotic heart disease of native coronary artery without angina pectoris: Secondary | ICD-10-CM

## 2017-04-19 LAB — CUP PACEART INCLINIC DEVICE CHECK
Date Time Interrogation Session: 20190404115431
Implantable Lead Implant Date: 20131017
Implantable Lead Location: 753858
Implantable Lead Location: 753859
Implantable Pulse Generator Implant Date: 20131017
MDC IDC LEAD IMPLANT DT: 20131017
MDC IDC LEAD IMPLANT DT: 20131017
MDC IDC LEAD LOCATION: 753860
MDC IDC PG SERIAL: 7063327

## 2017-04-19 NOTE — Addendum Note (Signed)
Addended by: Claude Manges on: 04/19/2017 01:02 PM   Modules accepted: Orders

## 2017-04-19 NOTE — Patient Instructions (Addendum)
Medication Instructions:   Your physician recommends that you continue on your current medications as directed. Please refer to the Current Medication list given to you today.   If you need a refill on your cardiac medications before your next appointment, please call your pharmacy.  Labwork:  BMET  CBC  TODAY    Testing/Procedures:  Your physician has requested that you have an echocardiogram. Echocardiography is a painless test that uses sound waves to create images of your heart. It provides your doctor with information about the size and shape of your heart and how well your heart's chambers and valves are working. This procedure takes approximately one hour. There are no restrictions for this procedure.   Follow-Up:  AFTER  05-11-17     [place]  WITH DR Caryl Comes    Any Other Special Instructions Will Be Listed Below (If Applicable).

## 2017-04-20 LAB — CBC
HEMATOCRIT: 41.8 % (ref 34.0–46.6)
Hemoglobin: 13.7 g/dL (ref 11.1–15.9)
MCH: 27.1 pg (ref 26.6–33.0)
MCHC: 32.8 g/dL (ref 31.5–35.7)
MCV: 83 fL (ref 79–97)
PLATELETS: 289 10*3/uL (ref 150–379)
RBC: 5.05 x10E6/uL (ref 3.77–5.28)
RDW: 14.6 % (ref 12.3–15.4)
WBC: 9.1 10*3/uL (ref 3.4–10.8)

## 2017-04-20 LAB — BASIC METABOLIC PANEL
BUN / CREAT RATIO: 10 — AB (ref 12–28)
BUN: 10 mg/dL (ref 8–27)
CO2: 25 mmol/L (ref 20–29)
CREATININE: 1.04 mg/dL — AB (ref 0.57–1.00)
Calcium: 10.3 mg/dL (ref 8.7–10.3)
Chloride: 103 mmol/L (ref 96–106)
GFR calc Af Amer: 59 mL/min/{1.73_m2} — ABNORMAL LOW (ref >59)
GFR calc non Af Amer: 51 mL/min/{1.73_m2} — ABNORMAL LOW (ref >59)
GLUCOSE: 124 mg/dL — AB (ref 65–99)
Potassium: 4.4 mmol/L (ref 3.5–5.2)
SODIUM: 145 mmol/L — AB (ref 134–144)

## 2017-04-25 ENCOUNTER — Telehealth: Payer: Self-pay

## 2017-04-25 NOTE — Telephone Encounter (Signed)
Pt called to clarify her upcoming appts for ECHO and Generator change out. We addressed her procedure instructions as well as her instructions to hold her Eliquis starting 4/24 for her change out on 4/26. Pt verbalized understanding and had no additional questions.

## 2017-05-09 ENCOUNTER — Ambulatory Visit (HOSPITAL_COMMUNITY): Payer: Medicare HMO | Attending: Cardiovascular Disease

## 2017-05-09 ENCOUNTER — Other Ambulatory Visit: Payer: Self-pay

## 2017-05-09 DIAGNOSIS — I255 Ischemic cardiomyopathy: Secondary | ICD-10-CM | POA: Diagnosis not present

## 2017-05-09 DIAGNOSIS — E785 Hyperlipidemia, unspecified: Secondary | ICD-10-CM | POA: Insufficient documentation

## 2017-05-09 DIAGNOSIS — I4891 Unspecified atrial fibrillation: Secondary | ICD-10-CM | POA: Insufficient documentation

## 2017-05-09 DIAGNOSIS — J45909 Unspecified asthma, uncomplicated: Secondary | ICD-10-CM | POA: Insufficient documentation

## 2017-05-09 DIAGNOSIS — I10 Essential (primary) hypertension: Secondary | ICD-10-CM | POA: Diagnosis not present

## 2017-05-09 DIAGNOSIS — Z95810 Presence of automatic (implantable) cardiac defibrillator: Secondary | ICD-10-CM | POA: Insufficient documentation

## 2017-05-09 DIAGNOSIS — Z8249 Family history of ischemic heart disease and other diseases of the circulatory system: Secondary | ICD-10-CM | POA: Insufficient documentation

## 2017-05-09 DIAGNOSIS — I447 Left bundle-branch block, unspecified: Secondary | ICD-10-CM | POA: Insufficient documentation

## 2017-05-09 DIAGNOSIS — I2510 Atherosclerotic heart disease of native coronary artery without angina pectoris: Secondary | ICD-10-CM | POA: Diagnosis not present

## 2017-05-11 ENCOUNTER — Ambulatory Visit (HOSPITAL_COMMUNITY)
Admission: RE | Admit: 2017-05-11 | Discharge: 2017-05-11 | Disposition: A | Discharge status: Home / Self Care | Payer: Medicare HMO | Source: Ambulatory Visit | Attending: Internal Medicine | Admitting: Internal Medicine

## 2017-05-11 ENCOUNTER — Encounter (HOSPITAL_COMMUNITY)
Admission: RE | Disposition: A | Discharge status: Home / Self Care | Payer: Self-pay | Source: Ambulatory Visit | Attending: Internal Medicine

## 2017-05-11 DIAGNOSIS — I5022 Chronic systolic (congestive) heart failure: Secondary | ICD-10-CM | POA: Diagnosis not present

## 2017-05-11 DIAGNOSIS — Z955 Presence of coronary angioplasty implant and graft: Secondary | ICD-10-CM | POA: Insufficient documentation

## 2017-05-11 DIAGNOSIS — I447 Left bundle-branch block, unspecified: Secondary | ICD-10-CM | POA: Diagnosis not present

## 2017-05-11 DIAGNOSIS — I255 Ischemic cardiomyopathy: Secondary | ICD-10-CM | POA: Insufficient documentation

## 2017-05-11 DIAGNOSIS — Z4502 Encounter for adjustment and management of automatic implantable cardiac defibrillator: Secondary | ICD-10-CM | POA: Insufficient documentation

## 2017-05-11 DIAGNOSIS — Z880 Allergy status to penicillin: Secondary | ICD-10-CM | POA: Insufficient documentation

## 2017-05-11 DIAGNOSIS — Z7901 Long term (current) use of anticoagulants: Secondary | ICD-10-CM | POA: Diagnosis not present

## 2017-05-11 DIAGNOSIS — I252 Old myocardial infarction: Secondary | ICD-10-CM | POA: Diagnosis not present

## 2017-05-11 DIAGNOSIS — J45909 Unspecified asthma, uncomplicated: Secondary | ICD-10-CM | POA: Insufficient documentation

## 2017-05-11 DIAGNOSIS — I2510 Atherosclerotic heart disease of native coronary artery without angina pectoris: Secondary | ICD-10-CM | POA: Diagnosis not present

## 2017-05-11 DIAGNOSIS — I110 Hypertensive heart disease with heart failure: Secondary | ICD-10-CM | POA: Insufficient documentation

## 2017-05-11 DIAGNOSIS — E785 Hyperlipidemia, unspecified: Secondary | ICD-10-CM | POA: Insufficient documentation

## 2017-05-11 DIAGNOSIS — I472 Ventricular tachycardia: Secondary | ICD-10-CM | POA: Diagnosis not present

## 2017-05-11 DIAGNOSIS — I480 Paroxysmal atrial fibrillation: Secondary | ICD-10-CM | POA: Insufficient documentation

## 2017-05-11 DIAGNOSIS — Z8674 Personal history of sudden cardiac arrest: Secondary | ICD-10-CM | POA: Insufficient documentation

## 2017-05-11 DIAGNOSIS — Z8249 Family history of ischemic heart disease and other diseases of the circulatory system: Secondary | ICD-10-CM | POA: Insufficient documentation

## 2017-05-11 HISTORY — PX: BIV ICD GENERATOR CHANGEOUT: EP1194

## 2017-05-11 LAB — GLUCOSE, CAPILLARY: Glucose-Capillary: 128 mg/dL — ABNORMAL HIGH (ref 65–99)

## 2017-05-11 LAB — SURGICAL PCR SCREEN
MRSA, PCR: NEGATIVE
Staphylococcus aureus: NEGATIVE

## 2017-05-11 SURGERY — BIV ICD GENERATOR CHANGEOUT

## 2017-05-11 MED ORDER — MUPIROCIN 2 % EX OINT
1.0000 "application " | TOPICAL_OINTMENT | Freq: Once | CUTANEOUS | Status: AC
  Administered 2017-05-11: 1 via TOPICAL
  Filled 2017-05-11: qty 22

## 2017-05-11 MED ORDER — SODIUM CHLORIDE 0.9 % IV SOLN: INTRAVENOUS | Status: AC

## 2017-05-11 MED ORDER — GENTAMICIN SULFATE 40 MG/ML IJ SOLN
INTRAMUSCULAR | Status: AC
  Filled 2017-05-11: qty 2

## 2017-05-11 MED ORDER — FENTANYL CITRATE (PF) 100 MCG/2ML IJ SOLN
INTRAMUSCULAR | Status: DC | PRN
  Administered 2017-05-11: 25 ug via INTRAVENOUS

## 2017-05-11 MED ORDER — LIDOCAINE HCL (PF) 1 % IJ SOLN
INTRAMUSCULAR | Status: DC | PRN
  Administered 2017-05-11: 45 mL

## 2017-05-11 MED ORDER — ACETAMINOPHEN 325 MG PO TABS: 325.0000 mg | ORAL_TABLET | ORAL | Status: DC | PRN

## 2017-05-11 MED ORDER — VANCOMYCIN HCL IN DEXTROSE 1-5 GM/200ML-% IV SOLN
INTRAVENOUS | Status: AC
  Filled 2017-05-11: qty 200

## 2017-05-11 MED ORDER — FENTANYL CITRATE (PF) 100 MCG/2ML IJ SOLN
INTRAMUSCULAR | Status: AC
  Filled 2017-05-11: qty 2

## 2017-05-11 MED ORDER — ONDANSETRON HCL 4 MG/2ML IJ SOLN: 4.0000 mg | Freq: Four times a day (QID) | INTRAMUSCULAR | Status: DC | PRN

## 2017-05-11 MED ORDER — SODIUM CHLORIDE 0.9 % IV SOLN
INTRAVENOUS | Status: DC
  Administered 2017-05-11: 09:00:00 via INTRAVENOUS

## 2017-05-11 MED ORDER — CHLORHEXIDINE GLUCONATE 4 % EX LIQD
60.0000 mL | Freq: Once | CUTANEOUS | Status: AC
  Administered 2017-05-11: 4 via TOPICAL
  Filled 2017-05-11: qty 60

## 2017-05-11 MED ORDER — MIDAZOLAM HCL 5 MG/5ML IJ SOLN
INTRAMUSCULAR | Status: AC
  Filled 2017-05-11: qty 5

## 2017-05-11 MED ORDER — MIDAZOLAM HCL 5 MG/5ML IJ SOLN
INTRAMUSCULAR | Status: DC | PRN
  Administered 2017-05-11 (×3): 1 mg via INTRAVENOUS

## 2017-05-11 MED ORDER — LIDOCAINE HCL (PF) 1 % IJ SOLN
INTRAMUSCULAR | Status: AC
  Filled 2017-05-11: qty 30

## 2017-05-11 MED ORDER — HEPARIN (PORCINE) IN NACL 1000-0.9 UT/500ML-% IV SOLN
INTRAVENOUS | Status: AC
  Filled 2017-05-11: qty 500

## 2017-05-11 MED ORDER — SODIUM CHLORIDE 0.9 % IV SOLN
80.0000 mg | INTRAVENOUS | Status: AC
  Administered 2017-05-11: 80 mg

## 2017-05-11 MED ORDER — VANCOMYCIN HCL IN DEXTROSE 1-5 GM/200ML-% IV SOLN
1000.0000 mg | INTRAVENOUS | Status: AC
  Administered 2017-05-11: 1000 mg via INTRAVENOUS

## 2017-05-11 MED ORDER — MUPIROCIN 2 % EX OINT
TOPICAL_OINTMENT | CUTANEOUS | Status: AC
  Administered 2017-05-11: 1 via TOPICAL
  Filled 2017-05-11: qty 22

## 2017-05-11 SURGICAL SUPPLY — 5 items
ASSURA CRTD CD3369-40Q (ICD Generator) ×2 IMPLANT
CABLE SURGICAL S-101-97-12 (CABLE) ×1 IMPLANT
DEFIB ASSURA MULTI-CHMBR CRT-D (ICD Generator) ×1 IMPLANT
PAD DEFIB LIFELINK (PAD) ×2 IMPLANT
TRAY PACEMAKER INSERTION (CUSTOM PROCEDURE TRAY) ×1 IMPLANT

## 2017-05-11 NOTE — Discharge Instructions (Signed)
**Note -identified via Obfuscation** Pacemaker Battery Change, Care After °This sheet gives you information about how to care for yourself after your procedure. Your health care provider may also give you more specific instructions. If you have problems or questions, contact your health care provider. °What can I expect after the procedure? °After your procedure, it is common to have: °· Pain or soreness at the site where the pacemaker was inserted. °· Swelling at the site where the pacemaker was inserted. ° °Follow these instructions at home: °Incision care °· Keep the incision clean and dry. °? Do not take baths, swim, or use a hot tub until your health care provider approves. °? You may shower the day after your procedure, or as directed by your health care provider. °? Pat the area dry with a clean towel. Do not rub the area. This may cause bleeding. °· Follow instructions from your health care provider about how to take care of your incision. Make sure you: °? Wash your hands with soap and water before you change your bandage (dressing). If soap and water are not available, use hand sanitizer. °? Change your dressing as told by your health care provider. °? Leave stitches (sutures), skin glue, or adhesive strips in place. These skin closures may need to stay in place for 2 weeks or longer. If adhesive strip edges start to loosen and curl up, you may trim the loose edges. Do not remove adhesive strips completely unless your health care provider tells you to do that. °· Check your incision area every day for signs of infection. Check for: °? More redness, swelling, or pain. °? More fluid or blood. °? Warmth. °? Pus or a bad smell. °Activity °· Do not lift anything that is heavier than 10 lb (4.5 kg) until your health care provider says it is okay to do so. °· For the first 2 weeks, or as long as told by your health care provider: °? Avoid lifting your left arm higher than your shoulder. °? Be gentle when you move your arms over your head. It is okay  to raise your arm to comb your hair. °? Avoid strenuous exercise. °· Ask your health care provider when it is okay to: °? Resume your normal activities. °? Return to work or school. °? Resume sexual activity. °Eating and drinking °· Eat a heart-healthy diet. This should include plenty of fresh fruits and vegetables, whole grains, low-fat dairy products, and lean protein like chicken and fish. °· Limit alcohol intake to no more than 1 drink a day for non-pregnant women and 2 drinks a day for men. One drink equals 12 oz of beer, 5 oz of wine, or 1½ oz of hard liquor. °· Check ingredients and nutrition facts on packaged foods and beverages. Avoid the following types of food: °? Food that is high in salt (sodium). °? Food that is high in saturated fat, like full-fat dairy or red meat. °? Food that is high in trans fat, like fried food. °? Food and drinks that are high in sugar. °Lifestyle °· Do not use any products that contain nicotine or tobacco, such as cigarettes and e-cigarettes. If you need help quitting, ask your health care provider. °· Take steps to manage and control your weight. °· Get regular exercise. Aim for 150 minutes of moderate-intensity exercise (such as walking or yoga) or 75 minutes of vigorous exercise (such as running or swimming) each week. °· Manage other health problems, such as diabetes or high blood pressure. Ask your health  **Note -identified via Obfuscation** care provider how you can manage these conditions. °General instructions °· Do not drive for 24 hours after your procedure if you were given a medicine to help you relax (sedative). °· Take over-the-counter and prescription medicines only as told by your health care provider. °· Avoid putting pressure on the area where the pacemaker was placed. °· If you need an MRI after your pacemaker has been placed, be sure to tell the health care provider who orders the MRI that you have a pacemaker. °· Avoid close and prolonged exposure to electrical devices that have strong  magnetic fields. These include: °? Cell phones. Avoid keeping them in a pocket near the pacemaker, and try using the ear opposite the pacemaker. °? MP3 players. °? Household appliances, like microwaves. °? Metal detectors. °? Electric generators. °? High-tension wires. °· Keep all follow-up visits as directed by your health care provider. This is important. °Contact a health care provider if: °· You have pain at the incision site that is not relieved by over-the-counter or prescription medicines. °· You have any of these around your incision site or coming from it: °? More redness, swelling, or pain. °? Fluid or blood. °? Warmth to the touch. °? Pus or a bad smell. °· You have a fever. °· You feel brief, occasional palpitations, light-headedness, or any symptoms that you think might be related to your heart. °Get help right away if: °· You experience chest pain that is different from the pain at the pacemaker site. °· You develop a red streak that extends above or below the incision site. °· You experience shortness of breath. °· You have palpitations or an irregular heartbeat. °· You have light-headedness that does not go away quickly. °· You faint or have dizzy spells. °· Your pulse suddenly drops or increases rapidly and does not return to normal. °· You begin to gain weight and your legs and ankles swell. °Summary °· After your procedure, it is common to have pain, soreness, and some swelling where the pacemaker was inserted. °· Make sure to keep your incision clean and dry. Follow instructions from your health care provider about how to take care of your incision. °· Check your incision every day for signs of infection, such as more pain or swelling, pus or a bad smell, warmth, or leaking fluid and blood. °· Avoid strenuous exercise and lifting your left arm higher than your shoulder for 2 weeks, or as long as told by your health care provider. °This information is not intended to replace advice given to you by  your health care provider. Make sure you discuss any questions you have with your health care provider. °Document Released: 10/23/2012 Document Revised: 11/25/2015 Document Reviewed: 11/25/2015 °Elsevier Interactive Patient Education © 2017 [employer] ° °

## 2017-05-11 NOTE — Interval H&P Note (Signed)
ICD Criteria  Current LVEF:55%. Within 12 months prior to implant: Yes   Heart failure history: Yes, Class II  Cardiomyopathy history: Yes, Non-Ischemic Cardiomyopathy.  Atrial Fibrillation/Atrial Flutter: No.  Ventricular tachycardia history: Yes, Hemodynamic instability present. VT Type: Sustained Ventricular Tachycardia - Monomorphic and Polymorphic.  Cardiac arrest history: No.  History of syndromes with risk of sudden death: No.  Previous ICD: Yes, Reason for ICD:  Secondary prevention.  Current ICD indication: Secondary  PPM indication: No.   Class I or II Bradycardia indication present: No  Beta Blocker therapy for 3 or more months: Yes, prescribed.   Ace Inhibitor/ARB therapy for 3 or more months: Yes, prescribed.   History and Physical Interval Note:  05/11/2017 9:44 AM  Alicia Guerrero  has presented today for surgery, with the diagnosis of ERI  The various methods of treatment have been discussed with the patient and family. After consideration of risks, benefits and other options for treatment, the patient has consented to  Procedure(s): ICD [place] (N/A) as a surgical intervention .  The patient's history has been reviewed, patient examined, no change in status, stable for surgery.  I have reviewed the patient's chart and labs.  Questions were answered to the patient's satisfaction.     Virl Axe

## 2017-05-11 NOTE — Progress Notes (Signed)
**Note -Identified via Obfuscation** Patient discharged home and accompanied by family. Patient states she needs a new refill for her Nitroglycerin. Multiple attempts made to contact on-call EP lab PA. Dr. Caryl Comes notified regarding need for Nitroglycerin refill.

## 2017-05-14 ENCOUNTER — Encounter (HOSPITAL_COMMUNITY): Payer: Self-pay | Admitting: Internal Medicine

## 2017-05-14 ENCOUNTER — Telehealth: Payer: Self-pay

## 2017-05-14 MED ORDER — NITROGLYCERIN 0.4 MG SL SUBL: 0.4000 mg | SUBLINGUAL_TABLET | SUBLINGUAL | 3 refills | Status: DC | PRN

## 2017-05-14 MED FILL — Heparin Sod (Porcine)-NaCl IV Soln 1000 Unit/500ML-0.9%: INTRAVENOUS | Qty: 500 | Status: AC

## 2017-05-14 NOTE — Telephone Encounter (Signed)
Nitro refill sent in.

## 2017-05-14 NOTE — Telephone Encounter (Signed)
-----   Message from [name], MD sent at 05/11/2017  5:42 PM EDT ----- L  She needs a refill on her nitro Rx  Please  Thanks steve

## 2017-05-24 ENCOUNTER — Ambulatory Visit (INDEPENDENT_AMBULATORY_CARE_PROVIDER_SITE_OTHER): Payer: Medicare HMO | Admitting: *Deleted

## 2017-05-24 DIAGNOSIS — I5022 Chronic systolic (congestive) heart failure: Secondary | ICD-10-CM | POA: Diagnosis not present

## 2017-05-24 DIAGNOSIS — Z95810 Presence of automatic (implantable) cardiac defibrillator: Secondary | ICD-10-CM

## 2017-05-24 DIAGNOSIS — I4901 Ventricular fibrillation: Secondary | ICD-10-CM

## 2017-05-24 LAB — CUP PACEART INCLINIC DEVICE CHECK
Battery Remaining Longevity: 50 mo
Brady Statistic RA Percent Paced: 84 %
HIGH POWER IMPEDANCE MEASURED VALUE: 63 Ohm
Implantable Lead Implant Date: 20131017
Implantable Lead Location: 753858
Implantable Lead Location: 753859
Implantable Lead Location: 753860
Lead Channel Impedance Value: 362.5 Ohm
Lead Channel Pacing Threshold Amplitude: 1 V
Lead Channel Pacing Threshold Pulse Width: 0.5 ms
Lead Channel Pacing Threshold Pulse Width: 0.5 ms
Lead Channel Setting Pacing Amplitude: 1.875
Lead Channel Setting Pacing Amplitude: 2 V
Lead Channel Setting Pacing Pulse Width: 1.5 ms
Lead Channel Setting Sensing Sensitivity: 0.5 mV
MDC IDC LEAD IMPLANT DT: 20131017
MDC IDC LEAD IMPLANT DT: 20131017
MDC IDC MSMT LEADCHNL LV IMPEDANCE VALUE: 812.5 Ohm
MDC IDC MSMT LEADCHNL LV PACING THRESHOLD AMPLITUDE: 3.25 V
MDC IDC MSMT LEADCHNL LV PACING THRESHOLD PULSEWIDTH: 1.5 ms
MDC IDC MSMT LEADCHNL RA IMPEDANCE VALUE: 375 Ohm
MDC IDC MSMT LEADCHNL RA PACING THRESHOLD AMPLITUDE: 0.875 V
MDC IDC MSMT LEADCHNL RA SENSING INTR AMPL: 3 mV
MDC IDC MSMT LEADCHNL RV SENSING INTR AMPL: 8.9 mV
MDC IDC PG IMPLANT DT: 20190426
MDC IDC PG SERIAL: 9820905
MDC IDC SESS DTM: 20190509171921
MDC IDC SET LEADCHNL LV PACING AMPLITUDE: 3.75 V
MDC IDC SET LEADCHNL RV PACING PULSEWIDTH: 0.5 ms

## 2017-05-24 NOTE — Progress Notes (Signed)
Wound check appointment. Dermabond removed. Wound without redness or edema. Incision edges approximated, wound well healed. Normal device function. RA and RV thresholds, sensing, and impedances consistent with implant measurements; outputs at chronic settings. LV threshold chronically elevated, measures 3.75V @ 1.36ms (M2-RVc), 3.25V @ 1.49ms (tip-RVc). No other vectors with capture. Reprogrammed LV vector to tip-RVc per historic settings per SK, LV capture remains on auto. Histogram distribution appropriate for patient and level of activity. No mode switches or ventricular arrhythmias noted. Patient educated about wound care, arm mobility, lifting restrictions, shock plan, and Merlin monitor. ROV with SK on 09/11/17.

## 2017-06-04 ENCOUNTER — Telehealth: Payer: Self-pay | Admitting: Internal Medicine

## 2017-06-04 NOTE — Telephone Encounter (Signed)
Copied from [place] [id number]. Topic: Quick Communication - Rx Refill/Question >> Jun 04, 2017  3:56 PM Scherrie Gerlach wrote: Medication: atorvastatin (LIPITOR) 40 MG tablet furosemide (LASIX) 40 MG tablet metFORMIN (GLUCOPHAGE) 500 MG tablet  [place], [place] [phone] (Phone) [phone] (Fax)  90 days

## 2017-06-05 MED ORDER — ATORVASTATIN CALCIUM 40 MG PO TABS: 40.0000 mg | ORAL_TABLET | Freq: Every day | ORAL | 0 refills | Status: DC

## 2017-06-05 MED ORDER — FUROSEMIDE 40 MG PO TABS: 40.0000 mg | ORAL_TABLET | Freq: Every day | ORAL | 0 refills | Status: DC

## 2017-06-05 MED ORDER — METFORMIN HCL 500 MG PO TABS: 500.0000 mg | ORAL_TABLET | Freq: Every day | ORAL | 0 refills | Status: DC

## 2017-06-05 NOTE — Telephone Encounter (Signed)
Pt is overdue for annual appt. Last saw MD 04/2016 will need appt before 90 day script can be approved...Alicia Guerrero

## 2017-06-05 NOTE — Addendum Note (Signed)
Addended by: Earnstine Regal on: 06/05/2017 10:44 AM   Modules accepted: Orders

## 2017-06-05 NOTE — Telephone Encounter (Signed)
An appointment for her CPE has been made for 07/20/17.

## 2017-06-05 NOTE — Telephone Encounter (Signed)
Sent enough refill to pharmacy until appt.Marland KitchenJohny Guerrero

## 2017-06-05 NOTE — Telephone Encounter (Signed)
Attempted to contact pt regarding prescription refill request; last office visit 05/15/17 for physical exam; no upcoming visits noted; spoke with pt's husband Jeneen Rinks and he states that she is still sleep; left message for pt to call office back  .  Lasix LOV: 05/15/16 Last Refill: ? PCP: [name] Pharmacy: [facility]   Glucophage LOV: 05/15/16 Last Refill: ? PCP: [name] Pharmacy: [facility]  Last HA1C: 05/15/16

## 2017-06-06 ENCOUNTER — Other Ambulatory Visit: Payer: Self-pay | Admitting: Internal Medicine

## 2017-06-06 MED ORDER — ATORVASTATIN CALCIUM 40 MG PO TABS: 40.0000 mg | ORAL_TABLET | Freq: Every day | ORAL | 0 refills | Status: DC

## 2017-06-06 NOTE — Addendum Note (Signed)
Addended by: Earnstine Regal on: 06/06/2017 01:56 PM   Modules accepted: Orders

## 2017-06-06 NOTE — Telephone Encounter (Addendum)
Contacted pt regarding phone call from MD's office; pt informed that refills for furosemide and metformin had been called into pharmacy; she verbalizes understanding; however her request for atorvastatin was not not addressed; will route request to office  Atorvastatin LOV: 05/15/16 Last Refill: PCP: Dr Alain [facility] Pharmacy:[facility]  Pt has appointment for physical scheduled 07/20/17 at [phone]

## 2017-06-06 NOTE — Telephone Encounter (Signed)
Copied from [place] [id number] [id number]. Topic: Quick Communication - Office Called Patient >> Jun 05, 2017  7:46 AM Yvette Rack wrote: Reason for CRM: pt returning call from practice they just called at 7:25 that person didn't say why they called >> Jun 05, 2017  8:18 AM Morphies, Isidoro Donning wrote: From 06/04/17 telephone note.

## 2017-06-06 NOTE — Addendum Note (Signed)
Addended by: Addison Naegeli on: 06/06/2017 01:33 PM   Modules accepted: Orders

## 2017-06-20 ENCOUNTER — Telehealth: Payer: Self-pay | Admitting: Cardiology

## 2017-06-20 NOTE — Telephone Encounter (Signed)
Patient called and wanted to know if she needed to be seen by MD before having oral surgery. Call routed to [place].

## 2017-06-20 NOTE — Telephone Encounter (Signed)
Ms. Alicia Guerrero is having significant tooth pain and has an appointment with an oral surgeon tomorrow. She has been using oral gel andmedications to help manage the pain. She isn't sure if she needs to stop her Eliquis. I confirmed that she just has an appointment tomorrow with Dr. Lewanda Rife and no procedure is scheduled, so she does not need to hold her Eliquis at this time. She says she has amoxicillin that was prescribed to take an hour before seeing the surgeon and she mentions that she is going to take them today to see if they help with the pain- I advised that she take them as prescribed and that they are not used for pain management.  I asked what she may need from Dr. Caryl Comes today and she mentions that he is the doctor she goes to if she "needs anything". I advised her that Dr. Caryl Comes cannot manage her dental condition and to call her PCP, but he is out of town this week. I advised if she is in significant pain and cannot wait until her appointment tomorrow that she should call Dr. Caesar Bookman office and let them know so that they can advise her as to what to do. She ended the phone conversation abruptly and hung up.

## 2017-06-28 ENCOUNTER — Telehealth: Payer: Self-pay | Admitting: Internal Medicine

## 2017-06-28 NOTE — Telephone Encounter (Signed)
Patient states she is scheduled for oral surgery on Monday 07/02/17.  Needs to know what to do about holding her Eliquis.

## 2017-06-28 NOTE — Telephone Encounter (Signed)
Spoke with pt regarding her tooth extractions scheduled for Monday 6/17. Per Dr Caryl Comes, pt should hold Eliquis on Sunday 6/16, Monday 6/17. To resume, he advises at least an additional 48hrs (Wed 6/18 and Thur 6/19) but she should consult her oral surgeon if he/she believes it should be held longer. Pt agrees to plan and verbalizes understanding.

## 2017-07-02 ENCOUNTER — Ambulatory Visit (INDEPENDENT_AMBULATORY_CARE_PROVIDER_SITE_OTHER)
Admission: RE | Admit: 2017-07-02 | Discharge: 2017-07-02 | Disposition: A | Discharge status: Home / Self Care | Payer: Medicare HMO | Source: Ambulatory Visit | Attending: Internal Medicine | Admitting: Internal Medicine

## 2017-07-02 ENCOUNTER — Encounter: Payer: Self-pay | Admitting: Internal Medicine

## 2017-07-02 ENCOUNTER — Ambulatory Visit (INDEPENDENT_AMBULATORY_CARE_PROVIDER_SITE_OTHER): Payer: Medicare HMO | Admitting: Internal Medicine

## 2017-07-02 DIAGNOSIS — J209 Acute bronchitis, unspecified: Secondary | ICD-10-CM | POA: Diagnosis not present

## 2017-07-02 DIAGNOSIS — J4520 Mild intermittent asthma, uncomplicated: Secondary | ICD-10-CM

## 2017-07-02 DIAGNOSIS — R05 Cough: Secondary | ICD-10-CM | POA: Diagnosis not present

## 2017-07-02 MED ORDER — METHYLPREDNISOLONE ACETATE 80 MG/ML IJ SUSP
80.0000 mg | Freq: Once | INTRAMUSCULAR | Status: AC
  Administered 2017-07-02: 80 mg via INTRAMUSCULAR

## 2017-07-02 MED ORDER — FLUTICASONE FUROATE-VILANTEROL 100-25 MCG/INH IN AEPB: 1.0000 | INHALATION_SPRAY | Freq: Every day | RESPIRATORY_TRACT | 5 refills | Status: DC

## 2017-07-02 MED ORDER — PROMETHAZINE-CODEINE 6.25-10 MG/5ML PO SYRP: 5.0000 mL | ORAL_SOLUTION | ORAL | 0 refills | Status: DC | PRN

## 2017-07-02 MED ORDER — METHYLPREDNISOLONE 4 MG PO TBPK: ORAL_TABLET | ORAL | 0 refills | Status: DC

## 2017-07-02 MED ORDER — AZITHROMYCIN 250 MG PO TABS: ORAL_TABLET | ORAL | 0 refills | Status: DC

## 2017-07-02 NOTE — Assessment & Plan Note (Signed)
Exacerbation - Depo-medrol 80 mg IM Breo qd CXR Medrol dose-pac

## 2017-07-02 NOTE — Assessment & Plan Note (Signed)
CXR Zpac Prom-cod syrup Rx

## 2017-07-02 NOTE — Progress Notes (Signed)
Subjective:  Patient ID: Alicia Guerrero, female    DOB: [birth year]  Age: 82 y.o. MRN: [id number]  CC: No chief complaint on file.   HPI Alicia Guerrero presents for cough and asthma w/SOB since Fri. C/o yellow mucus and runny nose  Outpatient Medications Prior to Visit  Medication Sig Dispense Refill  . acetaminophen (TYLENOL) 325 MG tablet Take 650 mg by mouth every 6 (six) hours as needed for moderate pain or headache.     . albuterol (PROVENTIL) (2.5 MG/3ML) 0.083% nebulizer solution Take 3 mLs (2.5 mg total) by nebulization every 6 (six) hours as needed. (Patient taking differently: Take 2.5 mg by nebulization every 6 (six) hours as needed for wheezing or shortness of breath. ) 75 mL 5  . apixaban (ELIQUIS) 5 MG TABS tablet Take 1 tablet (5 mg total) by mouth 2 (two) times daily. 180 tablet 3  . atorvastatin (LIPITOR) 40 MG tablet Take 1 tablet (40 mg total) by mouth daily. pls keep scheduled appt for future refills 90 tablet 0  . bisoprolol (ZEBETA) 5 MG tablet Take 1 tablet (5 mg total) by mouth daily. 90 tablet 2  . Blood Glucose Monitoring Suppl (ONE TOUCH ULTRA 2) w/Device KIT Use to check blood sugars daily Dx e11.9 1 each 0  . cetirizine (ZYRTEC) 10 MG tablet Take 1 tablet (10 mg total) by mouth as needed for allergies. (Patient taking differently: Take 10 mg by mouth daily. ) 90 tablet 3  . furosemide (LASIX) 40 MG tablet Take 1 tablet (40 mg total) by mouth daily. Must keep scheduled appt for future refills 90 tablet 0  . glucose blood (ONE TOUCH ULTRA TEST) test strip 1 each by Other route daily as needed for other. Use to check blood sugars twice a day Dx E11.9 50 each 11  . ketoconazole (NIZORAL) 2 % cream Apply 1 application topically daily. Under breasts (Patient taking differently: Apply 1 application topically daily as needed for irritation. Under breasts) 45 g 1  . Lancets (ONETOUCH ULTRASOFT) lancets 1 each by Other route 2 (two) times daily. Use to check blood sugars  twice a day Dx E11.9 100 each 3  . metFORMIN (GLUCOPHAGE) 500 MG tablet Take 1 tablet (500 mg total) by mouth daily with breakfast. Must keep scheduled appt for future refills 90 tablet 0  . Multiple Vitamins-Minerals (PRESERVISION AREDS PO) Take 1 capsule by mouth 2 (two) times daily.    . nitroGLYCERIN (NITROSTAT) 0.4 MG SL tablet Place 1 tablet (0.4 mg total) under the tongue every 5 (five) minutes x 3 doses as needed for chest pain. Chest pain 20 tablet 3  . potassium chloride SA (K-DUR,KLOR-CON) 20 MEQ tablet Take 1 tablet (20 mEq total) by mouth daily. 90 tablet 3  . telmisartan (MICARDIS) 40 MG tablet Take 1 tablet (40 mg total) by mouth daily. 90 tablet 3  . triamcinolone ointment (KENALOG) 0.5 % Apply 1 application topically 2 (two) times daily. On the leg rash 45 g 2   No facility-administered medications prior to visit.     ROS: Review of Systems  Constitutional: Positive for chills and fatigue. Negative for activity change, appetite change and unexpected weight change.  HENT: Positive for postnasal drip, rhinorrhea, sinus pain and sore throat. Negative for congestion, mouth sores and sinus pressure.   Eyes: Negative for visual disturbance.  Respiratory: Positive for cough, shortness of breath and wheezing. Negative for chest tightness.   Gastrointestinal: Negative for abdominal pain and nausea.  Genitourinary:  Negative for difficulty urinating, frequency and vaginal pain.  Musculoskeletal: Negative for back pain and gait problem.  Skin: Negative for pallor and rash.  Neurological: Negative for dizziness, tremors, weakness, numbness and headaches.  Psychiatric/Behavioral: Negative for confusion and sleep disturbance.    Objective:  BP 136/84 (BP Location: Left Arm, Patient Position: Sitting, Cuff Size: Normal)   Pulse 66   Temp 98.9 F (37.2 C) (Oral)   Ht 5' 3"  (1.6 m)   Wt 172 lb (78 kg)   SpO2 95%   BMI 30.47 kg/m   BP Readings from Last 3 Encounters:  07/02/17  136/84  05/11/17 (!) 146/86  04/19/17 124/88    Wt Readings from Last 3 Encounters:  07/02/17 172 lb (78 kg)  05/11/17 170 lb (77.1 kg)  04/19/17 175 lb (79.4 kg)    Physical Exam  Constitutional: She appears well-developed. No distress.  HENT:  Head: Normocephalic.  Right Ear: External ear normal.  Left Ear: External ear normal.  Nose: Nose normal.  Mouth/Throat: Oropharynx is clear and moist.  Eyes: Pupils are equal, round, and reactive to light. Conjunctivae are normal. Right eye exhibits no discharge. Left eye exhibits no discharge.  Neck: Normal range of motion. Neck supple. No JVD present. No tracheal deviation present. No thyromegaly present.  Cardiovascular: Normal rate, regular rhythm and normal heart sounds.  Pulmonary/Chest: No stridor. No respiratory distress. She has wheezes.  Abdominal: Soft. Bowel sounds are normal. She exhibits no distension and no mass. There is no tenderness. There is no rebound and no guarding.  Musculoskeletal: She exhibits no edema or tenderness.  Lymphadenopathy:    She has no cervical adenopathy.  Neurological: She displays normal reflexes. No cranial nerve deficit. She exhibits normal muscle tone. Coordination normal.  Skin: No rash noted. No erythema.  Psychiatric: She has a normal mood and affect. Her behavior is normal. Judgment and thought content normal.  eryth throat   I personally provided BREO inhaler use teaching. After the teaching patient was able to demonstrate it's use effectively. All questions were answered  Lab Results  Component Value Date   WBC 9.1 04/19/2017   HGB 13.7 04/19/2017   HCT 41.8 04/19/2017   PLT 289 04/19/2017   GLUCOSE 124 (H) 04/19/2017   CHOL 183 05/19/2015   TRIG 166.0 (H) 05/19/2015   HDL 46.50 05/19/2015   LDLDIRECT 91.1 03/21/2010   LDLCALC 103 (H) 05/19/2015   ALT 15 05/19/2015   AST 17 05/19/2015   NA 145 (H) 04/19/2017   K 4.4 04/19/2017   CL 103 04/19/2017   CREATININE 1.04 (H)  04/19/2017   BUN 10 04/19/2017   CO2 25 04/19/2017   TSH 2.78 05/19/2015   INR 3.3 12/09/2012   HGBA1C 6.7 05/15/2016    No results found.  Assessment & Plan:   There are no diagnoses linked to this encounter.   No orders of the defined types were placed in this encounter.    Follow-up: No follow-ups on file.  Walker Kehr, MD

## 2017-07-02 NOTE — Addendum Note (Signed)
Addended by: Karren Cobble on: 07/02/2017 01:54 PM   Modules accepted: Orders

## 2017-07-09 ENCOUNTER — Telehealth: Payer: Self-pay | Admitting: Internal Medicine

## 2017-07-09 NOTE — Telephone Encounter (Signed)
Copied from [place] [id number]. Topic: General - Other >> Jul 09, 2017  9:21 AM Lennox Solders wrote: Reason for CRM: pt is calling and needs a new rxs albuterol solution 0.083  and albuterol nebulizer. Pt lost her nebulizer machine. [place] pharmacy call [phone]

## 2017-07-10 MED ORDER — ALBUTEROL SULFATE (2.5 MG/3ML) 0.083% IN NEBU: 2.5000 mg | INHALATION_SOLUTION | Freq: Four times a day (QID) | RESPIRATORY_TRACT | 3 refills | Status: DC | PRN

## 2017-07-10 MED ORDER — NEBULIZER DEVI: 0 refills | Status: DC

## 2017-07-10 NOTE — Telephone Encounter (Signed)
RX sent

## 2017-07-18 DIAGNOSIS — R69 Illness, unspecified: Secondary | ICD-10-CM | POA: Diagnosis not present

## 2017-07-20 ENCOUNTER — Encounter: Payer: Medicare HMO | Admitting: Internal Medicine

## 2017-07-25 ENCOUNTER — Encounter: Payer: Self-pay | Admitting: Internal Medicine

## 2017-07-25 ENCOUNTER — Ambulatory Visit (INDEPENDENT_AMBULATORY_CARE_PROVIDER_SITE_OTHER): Payer: Medicare HMO | Admitting: Internal Medicine

## 2017-07-25 VITALS — BP 124/78 | HR 67 | Temp 98.1°F | Ht 63.0 in | Wt 168.0 lb

## 2017-07-25 DIAGNOSIS — I5022 Chronic systolic (congestive) heart failure: Secondary | ICD-10-CM

## 2017-07-25 DIAGNOSIS — Z0000 Encounter for general adult medical examination without abnormal findings: Secondary | ICD-10-CM | POA: Diagnosis not present

## 2017-07-25 DIAGNOSIS — I10 Essential (primary) hypertension: Secondary | ICD-10-CM | POA: Diagnosis not present

## 2017-07-25 DIAGNOSIS — E1121 Type 2 diabetes mellitus with diabetic nephropathy: Secondary | ICD-10-CM

## 2017-07-25 DIAGNOSIS — J4520 Mild intermittent asthma, uncomplicated: Secondary | ICD-10-CM | POA: Diagnosis not present

## 2017-07-25 DIAGNOSIS — I4901 Ventricular fibrillation: Secondary | ICD-10-CM

## 2017-07-25 MED ORDER — METFORMIN HCL 500 MG PO TABS: 500.0000 mg | ORAL_TABLET | Freq: Every day | ORAL | 3 refills | Status: DC

## 2017-07-25 MED ORDER — BISOPROLOL FUMARATE 5 MG PO TABS: 5.0000 mg | ORAL_TABLET | Freq: Every day | ORAL | 2 refills | Status: DC

## 2017-07-25 MED ORDER — ATORVASTATIN CALCIUM 40 MG PO TABS: 40.0000 mg | ORAL_TABLET | Freq: Every day | ORAL | 3 refills | Status: DC

## 2017-07-25 MED ORDER — KETOCONAZOLE 2 % EX CREA: 1.0000 "application " | TOPICAL_CREAM | Freq: Every day | CUTANEOUS | 1 refills | Status: DC | PRN

## 2017-07-25 MED ORDER — ZOSTER VAC RECOMB ADJUVANTED 50 MCG/0.5ML IM SUSR: 0.5000 mL | Freq: Once | INTRAMUSCULAR | 1 refills | Status: AC

## 2017-07-25 MED ORDER — TRIAMCINOLONE ACETONIDE 0.5 % EX OINT: 1.0000 "application " | TOPICAL_OINTMENT | Freq: Two times a day (BID) | CUTANEOUS | 3 refills | Status: DC

## 2017-07-25 MED ORDER — FUROSEMIDE 40 MG PO TABS: 40.0000 mg | ORAL_TABLET | Freq: Every day | ORAL | 3 refills | Status: DC

## 2017-07-25 MED ORDER — APIXABAN 5 MG PO TABS: 5.0000 mg | ORAL_TABLET | Freq: Two times a day (BID) | ORAL | 3 refills | Status: DC

## 2017-07-25 MED ORDER — TELMISARTAN 40 MG PO TABS: 40.0000 mg | ORAL_TABLET | Freq: Every day | ORAL | 3 refills | Status: DC

## 2017-07-25 MED ORDER — POTASSIUM CHLORIDE CRYS ER 20 MEQ PO TBCR: 20.0000 meq | EXTENDED_RELEASE_TABLET | Freq: Every day | ORAL | 3 refills | Status: DC

## 2017-07-25 NOTE — Assessment & Plan Note (Signed)
Here for medicare wellness/physical  Diet: heart healthy  Physical activity:  sedentary  Depression/mood screen: negative  Hearing: intact to whispered voice  Visual acuity: grossly normal, performs annual eye exam  ADLs: capable  Fall risk: low to none  Home safety: good  Cognitive evaluation: intact to orientation, naming, recall and repetition  EOL planning: adv directives, full code/ I agree  I have personally reviewed and have noted  1. The patient's medical, surgical and social history  2. Their use of alcohol, tobacco or illicit drugs  3. Their current medications and supplements  4. The patient's functional ability including ADL's, fall risks, home safety risks and hearing or visual impairment.  5. Diet and physical activities  6. Evidence for depression or mood disorders 7. The roster of all physicians providing medical care to patient - is listed in the Snapshot section of the chart and reviewed today.    Today patient counseled on age appropriate routine health concerns for screening and prevention, each reviewed and up to date or declined. Immunizations reviewed and up to date or declined. Labs ordered and reviewed. Risk factors for depression reviewed and negative. Hearing function and visual acuity are intact. ADLs screened and addressed as needed. Functional ability and level of safety reviewed and appropriate. Education, counseling and referrals performed based on assessed risks today. Patient provided with a copy of personalized plan for preventive services.  [facility]    Colonoscopy 2013

## 2017-07-25 NOTE — Assessment & Plan Note (Signed)
Valsartan, Zebeta, Furosemide

## 2017-07-25 NOTE — Assessment & Plan Note (Signed)
Breo

## 2017-07-25 NOTE — Assessment & Plan Note (Signed)
Labs Metformin 

## 2017-07-25 NOTE — Assessment & Plan Note (Signed)
New device.

## 2017-07-25 NOTE — Progress Notes (Signed)
Subjective:  Patient ID: Alicia Guerrero, female    DOB: Jun 11, 1935  Age: 82 y.o. MRN: [id number]  CC: No chief complaint on file.   HPI Alicia Guerrero presents for asthma, CHF, anticoagulation, HTN f/u Alicia Guerrero is in the donut hole Well exam  Outpatient Medications Prior to Visit  Medication Sig Dispense Refill  . acetaminophen (TYLENOL) 325 MG tablet Take 650 mg by mouth every 6 (six) hours as needed for moderate pain or headache.     . albuterol (PROVENTIL) (2.5 MG/3ML) 0.083% nebulizer solution Take 3 mLs (2.5 mg total) by nebulization every 6 (six) hours as needed for wheezing or shortness of breath. 75 mL 3  . apixaban (ELIQUIS) 5 MG TABS tablet Take 1 tablet (5 mg total) by mouth 2 (two) times daily. 180 tablet 3  . atorvastatin (LIPITOR) 40 MG tablet Take 1 tablet (40 mg total) by mouth daily. pls keep scheduled appt for future refills 90 tablet 0  . bisoprolol (ZEBETA) 5 MG tablet Take 1 tablet (5 mg total) by mouth daily. 90 tablet 2  . Blood Glucose Monitoring Suppl (ONE TOUCH ULTRA 2) w/Device KIT Use to check blood sugars daily Dx e11.9 1 each 0  . cetirizine (ZYRTEC) 10 MG tablet Take 1 tablet (10 mg total) by mouth as needed for allergies. (Patient taking differently: Take 10 mg by mouth daily. ) 90 tablet 3  . fluticasone furoate-vilanterol (BREO ELLIPTA) 100-25 MCG/INH AEPB Inhale 1 puff into the lungs daily. 1 each 5  . furosemide (LASIX) 40 MG tablet Take 1 tablet (40 mg total) by mouth daily. Must keep scheduled appt for future refills 90 tablet 0  . glucose blood (ONE TOUCH ULTRA TEST) test strip 1 each by Other route daily as needed for other. Use to check blood sugars twice a day Dx E11.9 50 each 11  . ketoconazole (NIZORAL) 2 % cream Apply 1 application topically daily. Under breasts (Patient taking differently: Apply 1 application topically daily as needed for irritation. Under breasts) 45 g 1  . Lancets (ONETOUCH ULTRASOFT) lancets 1 each by Other route 2 (two) times  daily. Use to check blood sugars twice a day Dx E11.9 100 each 3  . metFORMIN (GLUCOPHAGE) 500 MG tablet Take 1 tablet (500 mg total) by mouth daily with breakfast. Must keep scheduled appt for future refills 90 tablet 0  . methylPREDNISolone (MEDROL DOSEPAK) 4 MG TBPK tablet As directed 21 tablet 0  . Multiple Vitamins-Minerals (PRESERVISION AREDS PO) Take 1 capsule by mouth 2 (two) times daily.    . nitroGLYCERIN (NITROSTAT) 0.4 MG SL tablet Place 1 tablet (0.4 mg total) under the tongue every 5 (five) minutes x 3 doses as needed for chest pain. Chest pain 20 tablet 3  . potassium chloride SA (K-DUR,KLOR-CON) 20 MEQ tablet Take 1 tablet (20 mEq total) by mouth daily. 90 tablet 3  . Respiratory Therapy Supplies (NEBULIZER) DEVI Use with solution Q6H PRN wheezing or shortness of breath 1 each 0  . telmisartan (MICARDIS) 40 MG tablet Take 1 tablet (40 mg total) by mouth daily. 90 tablet 3  . triamcinolone ointment (KENALOG) 0.5 % Apply 1 application topically 2 (two) times daily. On the leg rash 45 g 2  . azithromycin (ZITHROMAX Z-PAK) 250 MG tablet As directed 6 tablet 0  . promethazine-codeine (PHENERGAN WITH CODEINE) 6.25-10 MG/5ML syrup Take 5 mLs by mouth every 4 (four) hours as needed. 300 mL 0   No facility-administered medications prior to visit.  ROS: Review of Systems  Constitutional: Negative for activity change, appetite change, chills, fatigue and unexpected weight change.  HENT: Negative for congestion, mouth sores and sinus pressure.   Eyes: Negative for visual disturbance.  Respiratory: Negative for cough and chest tightness.   Gastrointestinal: Negative for abdominal pain and nausea.  Genitourinary: Negative for difficulty urinating, frequency and vaginal pain.  Musculoskeletal: Negative for back pain and gait problem.  Skin: Negative for pallor and rash.  Neurological: Negative for dizziness, tremors, weakness, numbness and headaches.  Psychiatric/Behavioral: Negative  for confusion and sleep disturbance.    Objective:  BP 124/78 (BP Location: Left Arm, Patient Position: Sitting, Cuff Size: Large)   Pulse 67   Temp 98.1 F (36.7 C) (Oral)   Ht _0  (1.6 m)   Wt 168 lb (76.2 kg)   SpO2 96%   BMI 29.76 kg/m   BP Readings from Last 3 Encounters:  07/25/17 124/78  07/02/17 136/84  05/11/17 (!) 146/86    Wt Readings from Last 3 Encounters:  07/25/17 168 lb (76.2 kg)  07/02/17 172 lb (78 kg)  05/11/17 170 lb (77.1 kg)    Physical Exam  Constitutional: She appears well-developed. No distress.  HENT:  Head: Normocephalic.  Right Ear: External ear normal.  Left Ear: External ear normal.  Nose: Nose normal.  Mouth/Throat: Oropharynx is clear and moist.  Eyes: Pupils are equal, round, and reactive to light. Conjunctivae are normal. Right eye exhibits no discharge. Left eye exhibits no discharge.  Neck: Normal range of motion. Neck supple. No JVD present. No tracheal deviation present. No thyromegaly present.  Cardiovascular: Normal rate, regular rhythm and normal heart sounds.  Pulmonary/Chest: No stridor. No respiratory distress. She has no wheezes.  Abdominal: Soft. Bowel sounds are normal. She exhibits no distension and no mass. There is no tenderness. There is no rebound and no guarding.  Musculoskeletal: She exhibits no edema or tenderness.  Lymphadenopathy:    She has no cervical adenopathy.  Neurological: She displays normal reflexes. No cranial nerve deficit. She exhibits normal muscle tone. Coordination normal.  Skin: No rash noted. No erythema.  Psychiatric: She has a normal mood and affect. Her behavior is normal. Judgment and thought content normal.  edentulous obese  Lab Results  Component Value Date   WBC 9.1 04/19/2017   HGB 13.7 04/19/2017   HCT 41.8 04/19/2017   PLT 289 04/19/2017   GLUCOSE 124 (H) 04/19/2017   CHOL 183 05/19/2015   TRIG 166.0 (H) 05/19/2015   HDL 46.50 05/19/2015   LDLDIRECT 91.1 03/21/2010    LDLCALC 103 (H) 05/19/2015   ALT 15 05/19/2015   AST 17 05/19/2015   NA 145 (H) 04/19/2017   K 4.4 04/19/2017   CL 103 04/19/2017   CREATININE 1.04 (H) 04/19/2017   BUN 10 04/19/2017   CO2 25 04/19/2017   TSH 2.78 05/19/2015   INR 3.3 12/09/2012   HGBA1C 6.7 05/15/2016    Dg Chest 2 View  Result Date: 07/02/2017 CLINICAL DATA:  Cough and wheezing EXAM: CHEST - 2 VIEW COMPARISON:  January 28, 2014 FINDINGS: There is minimal scarring in the lateral left base. There is no edema or consolidation. Heart is upper normal in size with pulmonary vascularity normal. Pacemaker leads are attached to the right atrium, middle cardiac vein, and coronary sinus. No adenopathy. There is aortic atherosclerosis. There is degenerative change in the lower thoracic and upper lumbar spine regions. IMPRESSION: Slight scarring lateral left base. No edema or consolidation. Stable cardiac  silhouette. There is aortic atherosclerosis. Aortic Atherosclerosis (ICD10-I70.0). Electronically Signed   By: Lowella Grip III M.D.   On: 07/02/2017 13:24    Assessment & Plan:   There are no diagnoses linked to this encounter.   No orders of the defined types were placed in this encounter.    Follow-up: Return in about 3 months (around 10/25/2017) for a follow-up visit.  Walker Kehr, MD

## 2017-07-25 NOTE — Assessment & Plan Note (Signed)
Valsartan, Bisoprolol

## 2017-08-03 ENCOUNTER — Telehealth: Payer: Self-pay

## 2017-08-03 NOTE — Telephone Encounter (Signed)
Attempted to confirm remote transmission with pt. No answer and was unable to leave a message.  The person that answered the phone said the number for pt is the wrong number.

## 2017-08-10 ENCOUNTER — Telehealth: Payer: Self-pay | Admitting: Cardiology

## 2017-08-10 NOTE — Telephone Encounter (Signed)
Spoke w/ pt and requested that she send a manual transmission b/c her home monitor has not updated in at least 7 days.   

## 2017-09-11 ENCOUNTER — Encounter: Payer: Self-pay | Admitting: Internal Medicine

## 2017-09-11 ENCOUNTER — Ambulatory Visit (INDEPENDENT_AMBULATORY_CARE_PROVIDER_SITE_OTHER): Payer: Medicare HMO | Admitting: Internal Medicine

## 2017-09-11 VITALS — BP 144/82 | HR 66 | Ht 63.0 in | Wt 172.2 lb

## 2017-09-11 DIAGNOSIS — I5022 Chronic systolic (congestive) heart failure: Secondary | ICD-10-CM | POA: Diagnosis not present

## 2017-09-11 DIAGNOSIS — I4901 Ventricular fibrillation: Secondary | ICD-10-CM | POA: Diagnosis not present

## 2017-09-11 DIAGNOSIS — Z95810 Presence of automatic (implantable) cardiac defibrillator: Secondary | ICD-10-CM | POA: Diagnosis not present

## 2017-09-11 NOTE — Progress Notes (Signed)
Patient Care Team: [name], [name], MD as PCP - General (Internal Medicine) Myra Rude (Inactive) as Consulting Physician (Gastroenterology) [name], MD as Consulting Physician (Cardiology) Tanda Rockers, MD as Consulting Physician (Pulmonary Disease)   HPI  Alicia Guerrero is a 82 y.o. female Seen in followup for aborted cardiac arrest in the context of coronary artery disease and left bundle branch block. This occurred in the context of coronary artery disease. LHC at that time >>Stents patent in RCA/PDA, LAD, and D1. There is a 95% ostial stenosis of a moderate to large OM1. Looking at the prior cath, it is difficult to tell whether or not this was present before.  Because of persistent left ventricular dysfunction and no clear trigger for the event she successfully underwent CRT-D implantation. 10/13 by Dr. Amaryllis Dyke   She was hospitalized 11/14 for strokes x2 that occurred in the context of a supratherapeutic INR. It was elected to switch her to apixaban.    Echo EF 4/19 55-65%   Date Cr Hgb  5/17 1.13 13.8  4/19 1.04 13.7   The patient denies chest pain, shortness of breath x when climbing,  nocturnal dyspnea, orthopnea or peripheral edema.  There have been no palpitations, lightheadedness or syncope.  No bleeding   Past Medical History:  Diagnosis Date  . Allergic rhinitis due to pollen   . Asthma   . Atrial fibrillation ([place])   . Biventricular ICD (implantable cardiac defibrillator) in place    St Jude 10/2011 (implant MD: Allred)  . Bradycardia    a. coreg tapered off 05/2011 => b. Metoprolol restarted after cardiac arrest 09/2011  . CAD (coronary artery disease)    a. s/p stent to RCA 1996;  b. s/p cutting balloon PTCA to LAD 2001; c. s/p Cypher DES to LAD 2003;  d. NSTEMI 4/12: DES to dRCA;  e.  [place] 9/13 (after presentation with cardiac arrest and + Nz's for NSTEMI): pLAD 30% ISR, oD1 30%, dLAD 60-70%, pOM1 (small vessel) 90%, oOM2  95% (moderate to large vessel), mCFX 30%, pOM3 30-40%, dRCA stents and pPDA stents patent with 30% ISR => med Rx rec.   . Cancer ([place])   . Cardiac arrest ([place]) 09/2011   a. VT/VF in community; resusc unsuccessful;  PEA in ED; multiple defibs => revived;  [employer];  c/b by VDRF, cardiogenic shock;  LHC with stable anatomy => med Rx;  difficult ween from vent => s/p trach;  d/c to SNF  . Chronic eczema    hands  . Chronic systolic heart failure ([place])   . Hemorrhoids   . HLD (hyperlipidemia)   . HTN (hypertension)   . Ischemic cardiomyopathy    a. EF 40% at cath 4/12 with AL and Inf HK;  b.  Echo after presentation with cardiac arrest 9/13:  EF 40-45% and severe inf HK to AK c/w infarction, PASP 42  . Left bundle branch block   . Other and unspecified ovarian cyst   . Other atopic dermatitis and related conditions   . Personal history of hyperthyroidism   . Personal history of malignant neoplasm of breast     Past Surgical History:  Procedure Laterality Date  . ABDOMINAL HYSTERECTOMY    . BI-VENTRICULAR IMPLANTABLE CARDIOVERTER DEFIBRILLATOR N/A 11/02/2011   Procedure: BI-VENTRICULAR IMPLANTABLE CARDIOVERTER DEFIBRILLATOR  (CRT-D);  Surgeon: Thompson Grayer, MD;  Location: [facility] CATH LAB;  Service: Cardiovascular;  Laterality: N/A;  . [place]  N/A 05/11/2017   Procedure: BIV ICD GENERATOR CHANGEOUT;  Surgeon: [name], MD;  Location: [facility];  Service: Cardiovascular;  Laterality: N/A;  . BREAST LUMPECTOMY  08/24/2003   right lumpectomy+sln,T2N0,ERPR+,Her2-  . CHOLECYSTECTOMY    . COLONOSCOPY    . CORONARY STENT PLACEMENT  may and  july 2012  . LEFT HEART CATHETERIZATION WITH CORONARY ANGIOGRAM N/A 10/09/2011   Procedure: LEFT HEART CATHETERIZATION WITH CORONARY ANGIOGRAM;  Surgeon: Larey Dresser, MD;  Location: [facility] CATH LAB;  Service: Cardiovascular;  Laterality: N/A;  . OOPHORECTOMY    . POLYPECTOMY    . PTCA    . VIDEO BRONCHOSCOPY  10/26/2011    Procedure: VIDEO BRONCHOSCOPY WITHOUT FLUORO;  Surgeon: Raylene Miyamoto, MD;  Location: [facility] ENDOSCOPY;  Service: Endoscopy;  Laterality: Bilateral;    Current Outpatient Medications  Medication Sig Dispense Refill  . acetaminophen (TYLENOL) 325 MG tablet Take 650 mg by mouth every 6 (six) hours as needed for moderate pain or headache.     . albuterol (PROVENTIL) (2.5 MG/3ML) 0.083% nebulizer solution Take 3 mLs (2.5 mg total) by nebulization every 6 (six) hours as needed for wheezing or shortness of breath. 75 mL 3  . apixaban (ELIQUIS) 5 MG TABS tablet Take 1 tablet (5 mg total) by mouth 2 (two) times daily. 180 tablet 3  . atorvastatin (LIPITOR) 40 MG tablet Take 1 tablet (40 mg total) by mouth daily. pls keep scheduled appt for future refills 90 tablet 3  . bisoprolol (ZEBETA) 5 MG tablet Take 1 tablet (5 mg total) by mouth daily. 90 tablet 2  . Blood Glucose Monitoring Suppl (ONE TOUCH ULTRA 2) w/Device KIT Use to check blood sugars daily Dx e11.9 1 each 0  . cetirizine (ZYRTEC) 10 MG tablet Take 1 tablet (10 mg total) by mouth as needed for allergies. (Patient taking differently: Take 10 mg by mouth daily. ) 90 tablet 3  . fluticasone furoate-vilanterol (BREO ELLIPTA) 100-25 MCG/INH AEPB Inhale 1 puff into the lungs daily. 1 each 5  . furosemide (LASIX) 40 MG tablet Take 1 tablet (40 mg total) by mouth daily. Must keep scheduled appt for future refills 90 tablet 3  . glucose blood (ONE TOUCH ULTRA TEST) test strip 1 each by Other route daily as needed for other. Use to check blood sugars twice a day Dx E11.9 50 each 11  . ketoconazole (NIZORAL) 2 % cream Apply 1 application topically daily as needed for irritation. Under breasts 60 g 1  . Lancets (ONETOUCH ULTRASOFT) lancets 1 each by Other route 2 (two) times daily. Use to check blood sugars twice a day Dx E11.9 100 each 3  . metFORMIN (GLUCOPHAGE) 500 MG tablet Take 1 tablet (500 mg total) by mouth daily with breakfast. Must keep  scheduled appt for future refills 90 tablet 3  . methylPREDNISolone (MEDROL DOSEPAK) 4 MG TBPK tablet As directed 21 tablet 0  . Multiple Vitamins-Minerals (PRESERVISION AREDS PO) Take 1 capsule by mouth 2 (two) times daily.    . nitroGLYCERIN (NITROSTAT) 0.4 MG SL tablet Place 1 tablet (0.4 mg total) under the tongue every 5 (five) minutes x 3 doses as needed for chest pain. Chest pain 20 tablet 3  . potassium chloride SA (K-DUR,KLOR-CON) 20 MEQ tablet Take 1 tablet (20 mEq total) by mouth daily. 90 tablet 3  . Respiratory Therapy Supplies (NEBULIZER) DEVI Use with solution Q6H PRN wheezing or shortness of breath 1 each 0  . telmisartan (MICARDIS) 40 MG  tablet Take 1 tablet (40 mg total) by mouth daily. 90 tablet 3  . triamcinolone ointment (KENALOG) 0.5 % Apply 1 application topically 2 (two) times daily. On the leg rash 45 g 3   No current facility-administered medications for this visit.     Allergies  Allergen Reactions  . Coreg [Carvedilol] Other (See Comments)    Severe wheezing   . [name] [Trandolapril] Cough  . Meperidine Hcl Swelling and Other (See Comments)    Mixed with phenergan, swelling of the mouth   . Penicillins Other (See Comments)    Pt has taken penicillin about 6 years ago with no side effects  . Molds & Smuts Other (See Comments)    Runny nose  . Tape Other (See Comments)    Redness, pulls skin off  . Tetanus Toxoids Rash    Review of Systems negative except from HPI and PMH  Physical Exam BP (!) 144/82   Pulse 66   Ht 5' 3"  (1.6 m)   Wt 172 lb 3.2 oz (78.1 kg)   SpO2 98%   BMI 30.50 kg/m  Well developed and nourished in no acute distress HENT normal Neck supple with JVP-flat Clear Regular rate and rhythm, no murmurs or gallops Abd-soft with active BS No Clubbing cyanosis edema Skin-warm and dry A & Oriented  Grossly normal sensory and motor function    ECG Personally reviewed  demonstrates AV pacing with  upright QRS in lead V1 negative lead  1    Assessment and  Plan  Atrial fibrillation  Heart Failure-chronic systolic  Hypertension  Ventricular Tachycardia/fibrillation    Sinus Bradycardia  LV lead threshold-high  Biventricular ICD St Jude The patient's device was interrogated.  The information was reviewed. No changes were made in the programming.      Euvolemic continue current meds  No intercurrent atrial fibrillation or flutter  On Anticoagulation;  No bleeding issues   BP reasonably controlled   No intercurrent Ventricular tachycardia     .

## 2017-09-11 NOTE — Patient Instructions (Signed)
Medication Instructions:  Your physician recommends that you continue on your current medications as directed. Please refer to the Current Medication list given to you today.  Labwork: None ordered.  Testing/Procedures: None ordered.  Follow-Up: Your physician wants you to follow-up in: One Year with Dr Klein. You will receive a reminder letter in the mail two months in advance. If you don't receive a letter, please call our office to schedule the follow-up appointment.   Any Other Special Instructions Will Be Listed Below (If Applicable).     If you need a refill on your cardiac medications before your next appointment, please call your pharmacy.  

## 2017-10-11 ENCOUNTER — Encounter: Payer: Medicare HMO | Admitting: Internal Medicine

## 2017-10-28 DIAGNOSIS — R69 Illness, unspecified: Secondary | ICD-10-CM | POA: Diagnosis not present

## 2017-11-05 DIAGNOSIS — M8589 Other specified disorders of bone density and structure, multiple sites: Secondary | ICD-10-CM | POA: Diagnosis not present

## 2017-11-05 DIAGNOSIS — Z1231 Encounter for screening mammogram for malignant neoplasm of breast: Secondary | ICD-10-CM | POA: Diagnosis not present

## 2017-11-05 DIAGNOSIS — Z853 Personal history of malignant neoplasm of breast: Secondary | ICD-10-CM | POA: Diagnosis not present

## 2017-11-05 LAB — HM DEXA SCAN

## 2017-11-07 DIAGNOSIS — C50411 Malignant neoplasm of upper-outer quadrant of right female breast: Secondary | ICD-10-CM | POA: Diagnosis not present

## 2017-11-09 ENCOUNTER — Encounter: Payer: Self-pay | Admitting: Internal Medicine

## 2017-11-27 ENCOUNTER — Other Ambulatory Visit: Payer: Self-pay | Admitting: Physician Assistant

## 2017-11-27 NOTE — Telephone Encounter (Signed)
Eliquis 5mg  refill request received; pt is 82 yrs old, wt-78.1kg, Crea-1.04 on 04/19/17, last seen by Dr. Caryl Comes on 09/11/17; will send in refill to requested pharmacy.

## 2017-12-05 LAB — CUP PACEART INCLINIC DEVICE CHECK
Date Time Interrogation Session: 20191120161339
Implantable Lead Implant Date: 20131017
Implantable Lead Location: 753859
Implantable Pulse Generator Implant Date: 20190426
MDC IDC LEAD IMPLANT DT: 20131017
MDC IDC LEAD IMPLANT DT: 20131017
MDC IDC LEAD LOCATION: 753858
MDC IDC LEAD LOCATION: 753860
MDC IDC PG SERIAL: 9820905

## 2017-12-17 ENCOUNTER — Ambulatory Visit (INDEPENDENT_AMBULATORY_CARE_PROVIDER_SITE_OTHER): Payer: Medicare HMO

## 2017-12-17 DIAGNOSIS — I4901 Ventricular fibrillation: Secondary | ICD-10-CM | POA: Diagnosis not present

## 2017-12-17 NOTE — Progress Notes (Signed)
Remote ICD transmission.   

## 2018-01-21 ENCOUNTER — Other Ambulatory Visit: Payer: Self-pay | Admitting: Internal Medicine

## 2018-01-21 MED ORDER — FUROSEMIDE 40 MG PO TABS: 40.0000 mg | ORAL_TABLET | Freq: Every day | ORAL | 1 refills | Status: DC

## 2018-01-21 NOTE — Telephone Encounter (Signed)
Copied from [place] [id number]. Topic: Quick Communication - Rx Refill/Question >> Jan 21, 2018  8:35 AM [name], [place] C wrote: Medication: furosemide (LASIX) 40 MG tablet - pt says that she is completely out of her Rx. Advised pt of refill timeframe.  Has the patient contacted their pharmacy? Yes   (Agent: If no, request that the patient contact the pharmacy for the refill.) (Agent: If yes, when and what did the pharmacy advise?)  Preferred Pharmacy (with phone number or street name): [place], [place] [address] [phone] (Phone) [phone] (Fax)    Agent: Please be advised that RX refills may take up to 3 business days. We ask that you follow-up with your pharmacy.

## 2018-01-21 NOTE — Telephone Encounter (Signed)
Remaining refills sent to [facility] in [place]. Previous prescription was sent to [facility].

## 2018-01-28 ENCOUNTER — Emergency Department (HOSPITAL_COMMUNITY): Payer: Medicare HMO

## 2018-01-28 ENCOUNTER — Emergency Department (HOSPITAL_COMMUNITY)
Admission: EM | Admit: 2018-01-28 | Discharge: 2018-01-28 | Disposition: A | Discharge status: Home / Self Care | Payer: Medicare HMO | Attending: Emergency Medicine | Admitting: Emergency Medicine

## 2018-01-28 ENCOUNTER — Encounter (HOSPITAL_COMMUNITY): Payer: Self-pay | Admitting: Emergency Medicine

## 2018-01-28 DIAGNOSIS — E119 Type 2 diabetes mellitus without complications: Secondary | ICD-10-CM | POA: Insufficient documentation

## 2018-01-28 DIAGNOSIS — I5022 Chronic systolic (congestive) heart failure: Secondary | ICD-10-CM | POA: Insufficient documentation

## 2018-01-28 DIAGNOSIS — I110 Hypertensive heart disease with heart failure: Secondary | ICD-10-CM | POA: Diagnosis not present

## 2018-01-28 DIAGNOSIS — M19012 Primary osteoarthritis, left shoulder: Secondary | ICD-10-CM | POA: Diagnosis not present

## 2018-01-28 DIAGNOSIS — I10 Essential (primary) hypertension: Secondary | ICD-10-CM | POA: Diagnosis not present

## 2018-01-28 DIAGNOSIS — M549 Dorsalgia, unspecified: Secondary | ICD-10-CM | POA: Diagnosis not present

## 2018-01-28 DIAGNOSIS — M25512 Pain in left shoulder: Secondary | ICD-10-CM | POA: Insufficient documentation

## 2018-01-28 DIAGNOSIS — Z79899 Other long term (current) drug therapy: Secondary | ICD-10-CM | POA: Insufficient documentation

## 2018-01-28 DIAGNOSIS — Z7984 Long term (current) use of oral hypoglycemic drugs: Secondary | ICD-10-CM | POA: Insufficient documentation

## 2018-01-28 DIAGNOSIS — J4520 Mild intermittent asthma, uncomplicated: Secondary | ICD-10-CM | POA: Insufficient documentation

## 2018-01-28 DIAGNOSIS — Z7901 Long term (current) use of anticoagulants: Secondary | ICD-10-CM | POA: Diagnosis not present

## 2018-01-28 DIAGNOSIS — Z853 Personal history of malignant neoplasm of breast: Secondary | ICD-10-CM | POA: Insufficient documentation

## 2018-01-28 DIAGNOSIS — I2510 Atherosclerotic heart disease of native coronary artery without angina pectoris: Secondary | ICD-10-CM | POA: Diagnosis not present

## 2018-01-28 LAB — CBC
HEMATOCRIT: 37.8 % (ref 36.0–46.0)
Hemoglobin: 12 g/dL (ref 12.0–15.0)
MCH: 27.3 pg (ref 26.0–34.0)
MCHC: 31.7 g/dL (ref 30.0–36.0)
MCV: 85.9 fL (ref 80.0–100.0)
Platelets: 306 10*3/uL (ref 150–400)
RBC: 4.4 MIL/uL (ref 3.87–5.11)
RDW: 14 % (ref 11.5–15.5)
WBC: 13.3 10*3/uL — ABNORMAL HIGH (ref 4.0–10.5)
nRBC: 0 % (ref 0.0–0.2)

## 2018-01-28 LAB — BASIC METABOLIC PANEL
Anion gap: 13 (ref 5–15)
BUN: 15 mg/dL (ref 8–23)
CHLORIDE: 104 mmol/L (ref 98–111)
CO2: 22 mmol/L (ref 22–32)
Calcium: 9.4 mg/dL (ref 8.9–10.3)
Creatinine, Ser: 1.07 mg/dL — ABNORMAL HIGH (ref 0.44–1.00)
GFR calc Af Amer: 57 mL/min — ABNORMAL LOW (ref >60)
GFR calc non Af Amer: 49 mL/min — ABNORMAL LOW (ref >60)
Glucose, Bld: 218 mg/dL — ABNORMAL HIGH (ref 70–99)
Potassium: 3.7 mmol/L (ref 3.5–5.1)
SODIUM: 139 mmol/L (ref 135–145)

## 2018-01-28 LAB — I-STAT TROPONIN, ED: Troponin i, poc: 0.02 ng/mL (ref 0.00–0.08)

## 2018-01-28 MED ORDER — OXYCODONE-ACETAMINOPHEN 5-325 MG PO TABS: 1.0000 | ORAL_TABLET | ORAL | 0 refills | Status: DC | PRN

## 2018-01-28 MED ORDER — OXYCODONE-ACETAMINOPHEN 5-325 MG PO TABS
1.0000 | ORAL_TABLET | Freq: Once | ORAL | Status: AC
  Administered 2018-01-28: 1 via ORAL
  Filled 2018-01-28: qty 1

## 2018-01-28 MED ORDER — DEXAMETHASONE SODIUM PHOSPHATE 10 MG/ML IJ SOLN
10.0000 mg | Freq: Once | INTRAMUSCULAR | Status: AC
  Administered 2018-01-28: 10 mg via INTRAMUSCULAR
  Filled 2018-01-28: qty 1

## 2018-01-28 MED ORDER — HYDROMORPHONE HCL 1 MG/ML IJ SOLN
1.0000 mg | Freq: Once | INTRAMUSCULAR | Status: AC
  Administered 2018-01-28: 1 mg via INTRAMUSCULAR
  Filled 2018-01-28: qty 1

## 2018-01-28 NOTE — ED Triage Notes (Signed)
Reports pain in back of left shoulder that started a few days ago.  Reports being a heart patient with a pacemaker.

## 2018-01-28 NOTE — ED Notes (Signed)
1 attempt unsuccessful

## 2018-01-28 NOTE — ED Provider Notes (Signed)
[facility] EMERGENCY DEPARTMENT Provider Note   CSN: [id number] Arrival date & time: 01/28/18  0109     History   Chief Complaint Chief Complaint  Patient presents with  . Shoulder Pain    HPI Alicia Guerrero is a 83 y.o. female.  Patient presents to the emergency department for evaluation of left shoulder pain.  Patient reports that pain began 3 days ago.  She has a history of shoulder bursitis.  She reports that when the pain began she thought it was simply her bursitis, but the pain has progressively worsened and is now severe.  Pain is located just behind the left shoulder.  She cannot move her arm because of worsening pain.  She denies direct injury.  She has never had any diagnosed coronary artery disease or MI, does have a pacemaker.     Past Medical History:  Diagnosis Date  . Allergic rhinitis due to pollen   . Asthma   . Atrial fibrillation ([place])   . Biventricular ICD (implantable cardiac defibrillator) in place    St Jude 10/2011 (implant MD: Allred)  . Bradycardia    a. coreg tapered off 05/2011 => b. Metoprolol restarted after cardiac arrest 09/2011  . CAD (coronary artery disease)    a. s/p stent to RCA 1996;  b. s/p cutting balloon PTCA to LAD 2001; c. s/p Cypher DES to LAD 2003;  d. NSTEMI 4/12: DES to dRCA;  e.  [place] 9/13 (after presentation with cardiac arrest and + Nz's for NSTEMI): pLAD 30% ISR, oD1 30%, dLAD 60-70%, pOM1 (small vessel) 90%, oOM2 95% (moderate to large vessel), mCFX 30%, pOM3 30-40%, dRCA stents and pPDA stents patent with 30% ISR => med Rx rec.   . Cancer ([place])   . Cardiac arrest ([place]) 09/2011   a. VT/VF in community; resusc unsuccessful;  PEA in ED; multiple defibs => revived;  [employer];  c/b by VDRF, cardiogenic shock;  LHC with stable anatomy => med Rx;  difficult ween from vent => s/p trach;  d/c to SNF  . Chronic eczema    hands  . Chronic systolic heart failure ([place])   . Hemorrhoids   . HLD  (hyperlipidemia)   . HTN (hypertension)   . Ischemic cardiomyopathy    a. EF 40% at cath 4/12 with AL and Inf HK;  b.  [place] after presentation with cardiac arrest 9/13:  EF 40-45% and severe inf HK to AK c/w infarction, PASP 42  . Left bundle branch block   . Other and unspecified ovarian cyst   . Other atopic dermatitis and related conditions   . Personal history of hyperthyroidism   . Personal history of malignant neoplasm of breast     Patient Active Problem List   Diagnosis Date Noted  . Acute bronchitis 07/02/2017  . Diabetes type 2, controlled ([place]) 05/14/2015  . Intertriginous candidiasis 05/14/2015  . Eczema 05/14/2015  . Well adult exam 05/14/2014  . Essential hypertension 01/28/2014  . Hip pain 12/18/2013  . Chronic renal failure 05/17/2013  . Abnormal CT scan, chest 05/17/2013  . Dyspnea 05/16/2013  . Cough 05/06/2013  . Cerebral infarction ([place]) 12/05/2012  . Breast cancer ([place]) 08/01/2012  .  Diaphragmatic stimulation 02/15/2012  . Chronic systolic heart failure ([place]) 12/20/2011  . Long term (current) use of anticoagulants 11/10/2011  . Cardiomyopathy, ischemic 11/02/2011  . Biventricular defibrillator-St. Jude 11/02/2011  . Sinus bradycardia 10/01/2011  . Ventricular fibrillation ([place]) 09/26/2011  . Left bundle  branch block 05/25/2010  . CAROTID ARTERY STENOSIS, WITHOUT INFARCTION 09/15/2009  . ADENOCARCINOMA, BREAST, HX OF 09/26/2008  . Atrial fibrillation ([place]) 07/09/2008  . HYPERLIPIDEMIA, MIXED 01/01/2007  . Essential hypertension, benign 01/01/2007  . Coronary atherosclerosis 01/01/2007  . Mild intermittent asthma in adult without complication 37/48/2707  . HYPERTHYROIDISM, HX OF 01/01/2007  . PERCUTANEOUS TRANSLUMINAL CORONARY ANGIOPLASTY, HX OF 01/01/2007    Past Surgical History:  Procedure Laterality Date  . ABDOMINAL HYSTERECTOMY    . BI-VENTRICULAR IMPLANTABLE CARDIOVERTER DEFIBRILLATOR N/A 11/02/2011   Procedure: BI-VENTRICULAR IMPLANTABLE  CARDIOVERTER DEFIBRILLATOR  (CRT-D);  Surgeon: Thompson Grayer, MD;  Location: [facility] CATH LAB;  Service: Cardiovascular;  Laterality: N/A;  . BIV ICD GENERATOR CHANGEOUT N/A 05/11/2017   Procedure: BIV ICD GENERATOR CHANGEOUT;  Surgeon: [name], MD;  Location: [facility] CV LAB;  Service: Cardiovascular;  Laterality: N/A;  . BREAST LUMPECTOMY  08/24/2003   right lumpectomy+sln,T2N0,ERPR+,Her2-  . CHOLECYSTECTOMY    . COLONOSCOPY    . CORONARY STENT PLACEMENT  may and  july 2012  . LEFT HEART CATHETERIZATION WITH CORONARY ANGIOGRAM N/A 10/09/2011   Procedure: LEFT HEART CATHETERIZATION WITH CORONARY ANGIOGRAM;  Surgeon: Larey Dresser, MD;  Location: [facility] CATH LAB;  Service: Cardiovascular;  Laterality: N/A;  . OOPHORECTOMY    . POLYPECTOMY    . PTCA    . VIDEO BRONCHOSCOPY  10/26/2011   Procedure: VIDEO BRONCHOSCOPY WITHOUT FLUORO;  Surgeon: Raylene Miyamoto, MD;  Location: [facility] ENDOSCOPY;  Service: Endoscopy;  Laterality: Bilateral;     OB History   No obstetric history on file.      Home Medications    Prior to Admission medications   Medication Sig Start Date End Date Taking? Authorizing Provider  acetaminophen (TYLENOL) 325 MG tablet Take 650 mg by mouth every 6 (six) hours as needed for moderate pain or headache.    Yes [provider]  albuterol (PROVENTIL) (2.5 MG/3ML) 0.083% nebulizer solution Take 3 mLs (2.5 mg total) by nebulization every 6 (six) hours as needed for wheezing or shortness of breath. 07/10/17  Yes [name], [name], MD  apixaban (ELIQUIS) 5 MG TABS tablet Take 1 tablet (5 mg total) by mouth 2 (two) times daily. 07/25/17  Yes [name], [name], MD  atorvastatin (LIPITOR) 40 MG tablet Take 1 tablet (40 mg total) by mouth daily. pls keep scheduled appt for future refills 07/25/17  Yes [name], [name], MD  bisoprolol (ZEBETA) 5 MG tablet Take 1 tablet (5 mg total) by mouth daily. 07/25/17  Yes [name], [name], MD  cetirizine (ZYRTEC) 10 MG  tablet Take 1 tablet (10 mg total) by mouth as needed for allergies. Patient taking differently: Take 10 mg by mouth every morning.  05/19/16  Yes [name], [name], MD  fluticasone furoate-vilanterol (BREO ELLIPTA) 100-25 MCG/INH AEPB Inhale 1 puff into the lungs daily. Patient taking differently: Inhale 1 puff into the lungs daily as needed (shortness of breath).  07/02/17  Yes [name], [name], MD  furosemide (LASIX) 40 MG tablet Take 1 tablet (40 mg total) by mouth daily. Must keep scheduled appt for future refills 01/21/18  Yes [name], [name], MD  ketoconazole (NIZORAL) 2 % cream Apply 1 application topically daily as needed for irritation. Under breasts 07/25/17  Yes [name], [name], MD  metFORMIN (GLUCOPHAGE) 500 MG tablet Take 1 tablet (500 mg total) by mouth daily with breakfast. Must keep scheduled appt for future refills 07/25/17  Yes [name], [name], MD  Multiple Vitamins-Minerals (  PRESERVISION AREDS PO) Take 1 capsule by mouth 2 (two) times daily.   Yes [provider]  naproxen sodium (ALEVE) 220 MG tablet Take 220 mg by mouth daily as needed (pain).   Yes [provider]  nitroGLYCERIN (NITROSTAT) 0.4 MG SL tablet Place 1 tablet (0.4 mg total) under the tongue every 5 (five) minutes x 3 doses as needed for chest pain. Chest pain 05/14/17  Yes [name], MD  potassium chloride SA (K-DUR,KLOR-CON) 20 MEQ tablet Take 1 tablet (20 mEq total) by mouth daily. 07/25/17  Yes [name], [name], MD  telmisartan (MICARDIS) 40 MG tablet Take 1 tablet (40 mg total) by mouth daily. 07/25/17  Yes [name], [name], MD  triamcinolone ointment (KENALOG) 0.5 % Apply 1 application topically 2 (two) times daily. On the leg rash Patient taking differently: Apply 1 application topically 2 (two) times daily as needed (leg rash).  07/25/17  Yes [name], [name], MD  Blood Glucose Monitoring Suppl (ONE TOUCH ULTRA 2) w/Device KIT Use to check blood sugars daily Dx  e11.9 04/03/16   [name], [name], MD  glucose blood (ONE TOUCH ULTRA TEST) test strip 1 each by Other route daily as needed for other. Use to check blood sugars twice a day Dx E11.9 05/15/16   [name], [name], MD  Lancets [facility] ULTRASOFT) lancets 1 each by Other route 2 (two) times daily. Use to check blood sugars twice a day Dx E11.9 05/15/16   [name], [name], MD  Respiratory Therapy Supplies (NEBULIZER) DEVI Use with solution Q6H PRN wheezing or shortness of breath 07/10/17   [name], [name], MD    Family History Family History  Problem Relation Age of Onset  . Uterine cancer Mother   . Hypertension Mother   . Coronary artery disease Father   . Heart disease Brother   . Heart disease Sister   . Endometrial cancer Sister   . Heart disease Brother   . Clotting disorder Brother        blood clot  . Colon cancer Neg Hx     Social History Social History   Tobacco Use  . Smoking status: Never Smoker  . Smokeless tobacco: Never Used  Substance Use Topics  . Alcohol use: No    Alcohol/week: 0.0 standard drinks  . Drug use: No     Allergies   Coreg [carvedilol]; [name] [trandolapril]; Meperidine hcl; Penicillins; Molds & smuts; Tape; and Tetanus toxoids   Review of Systems Review of Systems  Musculoskeletal: Positive for arthralgias.  All other systems reviewed and are negative.    Physical Exam Updated Vital Signs Ht _0  (1.6 m)   Wt 78.9 kg   BMI 30.82 kg/m   Physical Exam Vitals signs and nursing note reviewed.  Constitutional:      General: She is not in acute distress.    Appearance: Normal appearance. She is well-developed.  HENT:     Head: Normocephalic and atraumatic.     Right Ear: Hearing normal.     Left Ear: Hearing normal.     Nose: Nose normal.  Eyes:     Conjunctiva/sclera: Conjunctivae normal.     Pupils: Pupils are equal, round, and reactive to light.  Neck:     Musculoskeletal: Normal range of motion and neck supple.    Cardiovascular:     Rate and Rhythm: Regular rhythm.     Heart sounds: S1 normal and S2 normal. No murmur. No friction rub. No gallop.  Pulmonary:     Effort: Pulmonary effort is normal. No respiratory distress.     Breath sounds: Normal breath sounds.  Chest:     Chest wall: No tenderness.  Abdominal:     General: Bowel sounds are normal.     Palpations: Abdomen is soft.     Tenderness: There is no abdominal tenderness. There is no guarding or rebound. Negative signs include Murphy's sign and McBurney's sign.     Hernia: No hernia is present.  Musculoskeletal:     Left shoulder: She exhibits decreased range of motion and tenderness. She exhibits no deformity.  Skin:    General: Skin is warm and dry.     Findings: Bruising (Anterior left shoulder) present. No rash.  Neurological:     Mental Status: She is alert and oriented to person, place, and time.     GCS: GCS eye subscore is 4. GCS verbal subscore is 5. GCS motor subscore is 6.     Cranial Nerves: No cranial nerve deficit.     Sensory: No sensory deficit.     Coordination: Coordination normal.  Psychiatric:        Speech: Speech normal.        Behavior: Behavior normal.        Thought Content: Thought content normal.      ED Treatments / Results  Labs (all labs ordered are listed, but only abnormal results are displayed) Labs Reviewed  CBC - Abnormal; Notable for the following components:      Result Value   WBC 13.3 (*)    All other components within normal limits  BASIC METABOLIC PANEL - Abnormal; Notable for the following components:   Glucose, Bld 218 (*)    Creatinine, Ser 1.07 (*)    GFR calc non Af Amer 49 (*)    GFR calc Af Amer 57 (*)    All other components within normal limits  I-STAT TROPONIN, ED    EKG None  Radiology Dg Shoulder Left  Result Date: 01/28/2018 CLINICAL DATA:  Back pain and left shoulder pain starting a few days ago. Old bruises on the anterior shoulder. No known injury. EXAM:  LEFT SHOULDER - 2+ VIEW COMPARISON:  None. FINDINGS: Degenerative changes in the glenohumeral and acromioclavicular joints. No evidence of acute fracture or dislocation. No focal bone lesion or bone destruction. Bone cortex appears intact. Pacemaker generator pack in the left chest. Calcification of the aorta. IMPRESSION: Degenerative changes in the left shoulder. No acute bony abnormalities. Electronically Signed   By: Lucienne Capers M.D.   On: 01/28/2018 03:34    Procedures Procedures (including critical care time)  Medications Ordered in ED Medications  oxyCODONE-acetaminophen (PERCOCET/ROXICET) 5-325 MG per tablet 1 tablet (1 tablet Oral Given 01/28/18 0339)     Initial Impression / Assessment and Plan / ED Course  I have reviewed the triage vital signs and the nursing notes.  Pertinent labs & imaging results that were available during my care of the patient were reviewed by me and considered in my medical decision making (see chart for details).     Patient presents with left shoulder pain.  Patient denies any injury, however, on examination she is noted to have bruising across the anterior aspect of the left shoulder.  Patient does not remember injuring the area, states that she is on Eliquis and does bruise easily.  Examination reveals some tenderness of the posterior shoulder soft tissues patient is holding her arm preferentially down at her side  and has severe worsening of pain with minimal movement at the shoulder.  This is consistent with musculoskeletal pain.  Cardiac evaluation performed and is negative.  The pain has been present for 3 days, I do not believe that she requires any further cardiac work-up.  X-ray of left shoulder unrevealing.  Patient appropriate for discharge, treat for musculoskeletal pain.  Final Clinical Impressions(s) / ED Diagnoses   Final diagnoses:  Acute pain of left shoulder    ED Discharge Orders    None       Keylah Darwish, Gwenyth Allegra,  MD 01/28/18 [phone]

## 2018-02-03 LAB — CUP PACEART REMOTE DEVICE CHECK
Battery Remaining Longevity: 45 mo
Battery Remaining Percentage: 86 %
Battery Voltage: 3.01 V
Brady Statistic AP VP Percent: 89 %
Brady Statistic AP VS Percent: 1 %
Brady Statistic AS VS Percent: 1 %
Brady Statistic RA Percent Paced: 89 %
Date Time Interrogation Session: 20191202070015
HIGH POWER IMPEDANCE MEASURED VALUE: 66 Ohm
HighPow Impedance: 66 Ohm
Implantable Lead Implant Date: 20131017
Implantable Lead Implant Date: 20131017
Implantable Lead Location: 753858
Implantable Lead Location: 753859
Implantable Lead Location: 753860
Lead Channel Impedance Value: 340 Ohm
Lead Channel Impedance Value: 380 Ohm
Lead Channel Impedance Value: 730 Ohm
Lead Channel Pacing Threshold Amplitude: 0.875 V
Lead Channel Pacing Threshold Amplitude: 1 V
Lead Channel Pacing Threshold Amplitude: 3.5 V
Lead Channel Pacing Threshold Pulse Width: 0.5 ms
Lead Channel Pacing Threshold Pulse Width: 0.5 ms
Lead Channel Pacing Threshold Pulse Width: 1.5 ms
Lead Channel Sensing Intrinsic Amplitude: 11.7 mV
Lead Channel Sensing Intrinsic Amplitude: 2.5 mV
Lead Channel Setting Pacing Amplitude: 1.875
Lead Channel Setting Pacing Amplitude: 2 V
Lead Channel Setting Pacing Amplitude: 4 V
Lead Channel Setting Pacing Pulse Width: 0.5 ms
Lead Channel Setting Pacing Pulse Width: 1.5 ms
Lead Channel Setting Sensing Sensitivity: 0.5 mV
MDC IDC LEAD IMPLANT DT: 20131017
MDC IDC PG IMPLANT DT: 20190426
MDC IDC STAT BRADY AS VP PERCENT: 9.5 %
Pulse Gen Serial Number: 9820905

## 2018-02-06 ENCOUNTER — Other Ambulatory Visit: Payer: Self-pay | Admitting: Internal Medicine

## 2018-03-18 ENCOUNTER — Ambulatory Visit (INDEPENDENT_AMBULATORY_CARE_PROVIDER_SITE_OTHER): Payer: Medicare HMO | Admitting: *Deleted

## 2018-03-18 DIAGNOSIS — I255 Ischemic cardiomyopathy: Secondary | ICD-10-CM

## 2018-03-18 DIAGNOSIS — I5022 Chronic systolic (congestive) heart failure: Secondary | ICD-10-CM

## 2018-03-18 LAB — CUP PACEART REMOTE DEVICE CHECK
Battery Remaining Longevity: 43 mo
Battery Remaining Percentage: 81 %
Battery Voltage: 2.96 V
Brady Statistic AP VS Percent: 1 %
Brady Statistic AS VP Percent: 9.2 %
Brady Statistic AS VS Percent: 1 %
Brady Statistic RA Percent Paced: 89 %
Date Time Interrogation Session: 20200302114536
HighPow Impedance: 66 Ohm
HighPow Impedance: 75 Ohm
Implantable Lead Implant Date: 20131017
Implantable Lead Implant Date: 20131017
Implantable Lead Location: 753858
Implantable Lead Location: 753859
Implantable Lead Location: 753860
Implantable Pulse Generator Implant Date: 20190426
Lead Channel Impedance Value: 330 Ohm
Lead Channel Impedance Value: 360 Ohm
Lead Channel Impedance Value: 760 Ohm
Lead Channel Pacing Threshold Amplitude: 0.75 V
Lead Channel Pacing Threshold Amplitude: 0.875 V
Lead Channel Pacing Threshold Amplitude: 3.625 V
Lead Channel Pacing Threshold Pulse Width: 0.5 ms
Lead Channel Pacing Threshold Pulse Width: 0.5 ms
Lead Channel Pacing Threshold Pulse Width: 1.5 ms
Lead Channel Sensing Intrinsic Amplitude: 11.7 mV
Lead Channel Sensing Intrinsic Amplitude: 2.5 mV
Lead Channel Setting Pacing Amplitude: 2 V
Lead Channel Setting Pacing Amplitude: 4.125
Lead Channel Setting Pacing Pulse Width: 1.5 ms
Lead Channel Setting Sensing Sensitivity: 0.5 mV
MDC IDC LEAD IMPLANT DT: 20131017
MDC IDC SET LEADCHNL RA PACING AMPLITUDE: 1.75 V
MDC IDC SET LEADCHNL RV PACING PULSEWIDTH: 0.5 ms
MDC IDC STAT BRADY AP VP PERCENT: 89 %
Pulse Gen Serial Number: 9820905

## 2018-03-25 NOTE — Progress Notes (Signed)
Remote ICD transmission.   

## 2018-06-17 ENCOUNTER — Ambulatory Visit (INDEPENDENT_AMBULATORY_CARE_PROVIDER_SITE_OTHER): Payer: Medicare HMO | Admitting: *Deleted

## 2018-06-17 DIAGNOSIS — I255 Ischemic cardiomyopathy: Secondary | ICD-10-CM | POA: Diagnosis not present

## 2018-06-18 LAB — CUP PACEART REMOTE DEVICE CHECK
Date Time Interrogation Session: 20200602083251
Implantable Lead Implant Date: 20131017
Implantable Lead Implant Date: 20131017
Implantable Lead Implant Date: 20131017
Implantable Lead Location: 753858
Implantable Lead Location: 753859
Implantable Lead Location: 753860
Implantable Pulse Generator Implant Date: 20190426
Pulse Gen Serial Number: 9820905

## 2018-06-24 ENCOUNTER — Encounter: Payer: Self-pay | Admitting: Cardiology

## 2018-06-24 NOTE — Progress Notes (Signed)
Remote ICD transmission.   

## 2018-06-25 ENCOUNTER — Encounter (HOSPITAL_COMMUNITY): Payer: Self-pay

## 2018-06-25 ENCOUNTER — Ambulatory Visit: Payer: Self-pay | Admitting: Internal Medicine

## 2018-06-25 ENCOUNTER — Other Ambulatory Visit: Payer: Self-pay

## 2018-06-25 ENCOUNTER — Emergency Department (HOSPITAL_COMMUNITY): Payer: Medicare HMO

## 2018-06-25 ENCOUNTER — Observation Stay (HOSPITAL_COMMUNITY)
Admission: EM | Admit: 2018-06-25 | Discharge: 2018-06-26 | Disposition: A | Discharge status: Home / Self Care | Payer: Medicare HMO | Attending: Internal Medicine | Admitting: Internal Medicine

## 2018-06-25 DIAGNOSIS — Z20828 Contact with and (suspected) exposure to other viral communicable diseases: Secondary | ICD-10-CM | POA: Insufficient documentation

## 2018-06-25 DIAGNOSIS — I10 Essential (primary) hypertension: Secondary | ICD-10-CM | POA: Diagnosis present

## 2018-06-25 DIAGNOSIS — Z853 Personal history of malignant neoplasm of breast: Secondary | ICD-10-CM | POA: Diagnosis not present

## 2018-06-25 DIAGNOSIS — E119 Type 2 diabetes mellitus without complications: Secondary | ICD-10-CM | POA: Diagnosis not present

## 2018-06-25 DIAGNOSIS — Z7984 Long term (current) use of oral hypoglycemic drugs: Secondary | ICD-10-CM | POA: Diagnosis not present

## 2018-06-25 DIAGNOSIS — R270 Ataxia, unspecified: Principal | ICD-10-CM | POA: Insufficient documentation

## 2018-06-25 DIAGNOSIS — E782 Mixed hyperlipidemia: Secondary | ICD-10-CM | POA: Diagnosis present

## 2018-06-25 DIAGNOSIS — Z95810 Presence of automatic (implantable) cardiac defibrillator: Secondary | ICD-10-CM | POA: Insufficient documentation

## 2018-06-25 DIAGNOSIS — Z79899 Other long term (current) drug therapy: Secondary | ICD-10-CM | POA: Diagnosis not present

## 2018-06-25 DIAGNOSIS — R69 Illness, unspecified: Secondary | ICD-10-CM | POA: Diagnosis not present

## 2018-06-25 DIAGNOSIS — Z7901 Long term (current) use of anticoagulants: Secondary | ICD-10-CM | POA: Insufficient documentation

## 2018-06-25 DIAGNOSIS — Z8673 Personal history of transient ischemic attack (TIA), and cerebral infarction without residual deficits: Secondary | ICD-10-CM

## 2018-06-25 DIAGNOSIS — I5022 Chronic systolic (congestive) heart failure: Secondary | ICD-10-CM | POA: Insufficient documentation

## 2018-06-25 DIAGNOSIS — I255 Ischemic cardiomyopathy: Secondary | ICD-10-CM | POA: Diagnosis present

## 2018-06-25 DIAGNOSIS — I2510 Atherosclerotic heart disease of native coronary artery without angina pectoris: Secondary | ICD-10-CM | POA: Insufficient documentation

## 2018-06-25 DIAGNOSIS — I639 Cerebral infarction, unspecified: Secondary | ICD-10-CM | POA: Diagnosis not present

## 2018-06-25 DIAGNOSIS — J45909 Unspecified asthma, uncomplicated: Secondary | ICD-10-CM | POA: Insufficient documentation

## 2018-06-25 DIAGNOSIS — R2689 Other abnormalities of gait and mobility: Secondary | ICD-10-CM | POA: Insufficient documentation

## 2018-06-25 DIAGNOSIS — Z9049 Acquired absence of other specified parts of digestive tract: Secondary | ICD-10-CM | POA: Insufficient documentation

## 2018-06-25 DIAGNOSIS — F430 Acute stress reaction: Secondary | ICD-10-CM

## 2018-06-25 DIAGNOSIS — R41841 Cognitive communication deficit: Secondary | ICD-10-CM | POA: Diagnosis not present

## 2018-06-25 DIAGNOSIS — R410 Disorientation, unspecified: Secondary | ICD-10-CM | POA: Diagnosis not present

## 2018-06-25 DIAGNOSIS — I110 Hypertensive heart disease with heart failure: Secondary | ICD-10-CM | POA: Diagnosis not present

## 2018-06-25 DIAGNOSIS — Z03818 Encounter for observation for suspected exposure to other biological agents ruled out: Secondary | ICD-10-CM | POA: Diagnosis not present

## 2018-06-25 DIAGNOSIS — R269 Unspecified abnormalities of gait and mobility: Secondary | ICD-10-CM | POA: Diagnosis present

## 2018-06-25 DIAGNOSIS — R4182 Altered mental status, unspecified: Secondary | ICD-10-CM | POA: Diagnosis not present

## 2018-06-25 LAB — CBC WITH DIFFERENTIAL/PLATELET
Abs Immature Granulocytes: 0.02 10*3/uL (ref 0.00–0.07)
Basophils Absolute: 0.1 10*3/uL (ref 0.0–0.1)
Basophils Relative: 1 %
Eosinophils Absolute: 0.3 10*3/uL (ref 0.0–0.5)
Eosinophils Relative: 5 %
HCT: 41.5 % (ref 36.0–46.0)
Hemoglobin: 12.9 g/dL (ref 12.0–15.0)
Immature Granulocytes: 0 %
Lymphocytes Relative: 23 %
Lymphs Abs: 1.5 10*3/uL (ref 0.7–4.0)
MCH: 25.9 pg — ABNORMAL LOW (ref 26.0–34.0)
MCHC: 31.1 g/dL (ref 30.0–36.0)
MCV: 83.3 fL (ref 80.0–100.0)
Monocytes Absolute: 0.6 10*3/uL (ref 0.1–1.0)
Monocytes Relative: 9 %
Neutro Abs: 3.9 10*3/uL (ref 1.7–7.7)
Neutrophils Relative %: 62 %
Platelets: 269 10*3/uL (ref 150–400)
RBC: 4.98 MIL/uL (ref 3.87–5.11)
RDW: 13.7 % (ref 11.5–15.5)
WBC: 6.3 10*3/uL (ref 4.0–10.5)
nRBC: 0 % (ref 0.0–0.2)

## 2018-06-25 LAB — GLUCOSE, CAPILLARY: Glucose-Capillary: 151 mg/dL — ABNORMAL HIGH (ref 70–99)

## 2018-06-25 LAB — COMPREHENSIVE METABOLIC PANEL
ALT: 16 U/L (ref 0–44)
AST: 19 U/L (ref 15–41)
Albumin: 4 g/dL (ref 3.5–5.0)
Alkaline Phosphatase: 65 U/L (ref 38–126)
Anion gap: 13 (ref 5–15)
BUN: 9 mg/dL (ref 8–23)
CO2: 23 mmol/L (ref 22–32)
Calcium: 9.7 mg/dL (ref 8.9–10.3)
Chloride: 106 mmol/L (ref 98–111)
Creatinine, Ser: 0.92 mg/dL (ref 0.44–1.00)
GFR calc Af Amer: >60 mL/min (ref >60)
GFR calc non Af Amer: 59 mL/min — ABNORMAL LOW (ref >60)
Glucose, Bld: 147 mg/dL — ABNORMAL HIGH (ref 70–99)
Potassium: 3.3 mmol/L — ABNORMAL LOW (ref 3.5–5.1)
Sodium: 142 mmol/L (ref 135–145)
Total Bilirubin: 0.8 mg/dL (ref 0.3–1.2)
Total Protein: 6.8 g/dL (ref 6.5–8.1)

## 2018-06-25 LAB — VITAMIN B12: Vitamin B-12: 193 pg/mL (ref 180–914)

## 2018-06-25 LAB — SARS CORONAVIRUS 2: SARS Coronavirus 2: NOT DETECTED

## 2018-06-25 LAB — CBG MONITORING, ED: Glucose-Capillary: 129 mg/dL — ABNORMAL HIGH (ref 70–99)

## 2018-06-25 LAB — TSH: TSH: 2.054 u[IU]/mL (ref 0.350–4.500)

## 2018-06-25 LAB — FOLATE: Folate: 17.5 ng/mL (ref >5.9)

## 2018-06-25 MED ORDER — ATORVASTATIN CALCIUM 40 MG PO TABS
40.0000 mg | ORAL_TABLET | Freq: Every day | ORAL | Status: DC
  Administered 2018-06-26: 40 mg via ORAL
  Filled 2018-06-25: qty 1

## 2018-06-25 MED ORDER — IRBESARTAN 150 MG PO TABS: 150.0000 mg | ORAL_TABLET | Freq: Every day | ORAL | Status: DC

## 2018-06-25 MED ORDER — ACETAMINOPHEN 160 MG/5ML PO SOLN: 650.0000 mg | ORAL | Status: DC | PRN

## 2018-06-25 MED ORDER — INSULIN ASPART 100 UNIT/ML ~~LOC~~ SOLN
0.0000 [IU] | Freq: Three times a day (TID) | SUBCUTANEOUS | Status: DC
  Administered 2018-06-26: 3 [IU] via SUBCUTANEOUS

## 2018-06-25 MED ORDER — FUROSEMIDE 40 MG PO TABS
40.0000 mg | ORAL_TABLET | Freq: Every day | ORAL | Status: DC
  Administered 2018-06-26: 12:00:00 40 mg via ORAL
  Filled 2018-06-25: qty 1

## 2018-06-25 MED ORDER — PROSIGHT PO TABS
1.0000 | ORAL_TABLET | Freq: Every day | ORAL | Status: DC
  Administered 2018-06-25 – 2018-06-26 (×2): 1 via ORAL
  Filled 2018-06-25 (×2): qty 1

## 2018-06-25 MED ORDER — OCUVITE-LUTEIN PO CAPS
1.0000 | ORAL_CAPSULE | Freq: Every day | ORAL | Status: DC
  Filled 2018-06-25 (×2): qty 1

## 2018-06-25 MED ORDER — ACETAMINOPHEN 325 MG PO TABS: 650.0000 mg | ORAL_TABLET | ORAL | Status: DC | PRN

## 2018-06-25 MED ORDER — STROKE: EARLY STAGES OF RECOVERY BOOK
Freq: Once | Status: AC
  Administered 2018-06-25: 22:00:00

## 2018-06-25 MED ORDER — BISOPROLOL FUMARATE 5 MG PO TABS: 5.0000 mg | ORAL_TABLET | Freq: Every day | ORAL | Status: DC

## 2018-06-25 MED ORDER — SODIUM CHLORIDE 0.9 % IV SOLN: INTRAVENOUS | Status: DC

## 2018-06-25 MED ORDER — PRESERVISION AREDS PO CAPS: ORAL_CAPSULE | Freq: Two times a day (BID) | ORAL | Status: DC

## 2018-06-25 MED ORDER — ACETAMINOPHEN 650 MG RE SUPP: 650.0000 mg | RECTAL | Status: DC | PRN

## 2018-06-25 MED ORDER — NITROGLYCERIN 0.4 MG SL SUBL: 0.4000 mg | SUBLINGUAL_TABLET | SUBLINGUAL | Status: DC | PRN

## 2018-06-25 MED ORDER — POTASSIUM CHLORIDE CRYS ER 20 MEQ PO TBCR
20.0000 meq | EXTENDED_RELEASE_TABLET | Freq: Every day | ORAL | Status: DC
  Administered 2018-06-26: 12:00:00 20 meq via ORAL
  Filled 2018-06-25: qty 1

## 2018-06-25 MED ORDER — LORATADINE 10 MG PO TABS
10.0000 mg | ORAL_TABLET | Freq: Every day | ORAL | Status: DC
  Administered 2018-06-26: 12:00:00 10 mg via ORAL
  Filled 2018-06-25: qty 1

## 2018-06-25 MED ORDER — SENNOSIDES-DOCUSATE SODIUM 8.6-50 MG PO TABS: 1.0000 | ORAL_TABLET | Freq: Every evening | ORAL | Status: DC | PRN

## 2018-06-25 MED ORDER — APIXABAN 5 MG PO TABS
5.0000 mg | ORAL_TABLET | Freq: Two times a day (BID) | ORAL | Status: DC
  Administered 2018-06-25 – 2018-06-26 (×2): 5 mg via ORAL
  Filled 2018-06-25 (×2): qty 1

## 2018-06-25 NOTE — ED Notes (Signed)
Patient transported to CT 

## 2018-06-25 NOTE — ED Notes (Signed)
Alicia Guerrero, [phone], call for updates

## 2018-06-25 NOTE — ED Provider Notes (Signed)
[facility] Provider Note   CSN: [id number] Arrival date & time: 06/25/18  1401    History   Chief Complaint Chief Complaint  Patient presents with   Stroke Symptoms   balance issues    HPI Alicia [facility]ro is a 83 y.o. female.     HPI Pt states she started feeling unstable about a week ago.  She has not fallen but she feels unsteady when she walks.  It is intermittent and has not changed in severity.  She has not noticed that she is falling to any particular side.  Pt had been visiting her sister in [place].  Pt drove home about a week ago she states.  She got lost coming home.  Notes in the chart indicate pt is very forgetful.  No vomiting or diarrhea.  No fevers or chills.   Pt states she is worn out from staying with her sister.  Her sister was hollering and yelling at night. Pt was not able to sleep and she had to calm her down all of the time.  Past Medical History:  Diagnosis Date   Allergic rhinitis due to pollen    Asthma    Atrial fibrillation ([place])    Biventricular ICD (implantable cardiac defibrillator) in place    St Jude 10/2011 (implant MD: Allred)   Bradycardia    a. coreg tapered off 05/2011 => b. Metoprolol restarted after cardiac arrest 09/2011   CAD (coronary artery disease)    a. s/p stent to RCA 1996;  b. s/p cutting balloon PTCA to LAD 2001; c. s/p Cypher DES to LAD 2003;  d. NSTEMI 4/12: DES to dRCA;  e.  [place] 9/13 (after presentation with cardiac arrest and + Nz's for NSTEMI): pLAD 30% ISR, oD1 30%, dLAD 60-70%, pOM1 (small vessel) 90%, oOM2 95% (moderate to large vessel), mCFX 30%, pOM3 30-40%, dRCA stents and pPDA stents patent with 30% ISR => med Rx rec.    Cancer [facility])    Cardiac arrest ([place]) 09/2011   a. VT/VF in community; resusc unsuccessful;  PEA in ED; multiple defibs => revived;  [employer];  c/b by VDRF, cardiogenic shock;  LHC with stable anatomy => med Rx;  difficult ween from vent => s/p  trach;  d/c to SNF   Chronic eczema    hands   Chronic systolic heart failure ([place])    Hemorrhoids    HLD (hyperlipidemia)    HTN (hypertension)    Ischemic cardiomyopathy    a. EF 40% at cath 4/12 with AL and Inf HK;  b.  Echo after presentation with cardiac arrest 9/13:  EF 40-45% and severe inf HK to AK c/w infarction, PASP 42   Left bundle branch block    Other and unspecified ovarian cyst    Other atopic dermatitis and related conditions    Personal history of hyperthyroidism    Personal history of malignant neoplasm of breast     Patient Active Problem List   Diagnosis Date Noted   Acute bronchitis 07/02/2017   Diabetes type 2, controlled ([place]) 05/14/2015   Intertriginous candidiasis 05/14/2015   Eczema 05/14/2015   Well adult exam 05/14/2014   Essential hypertension 01/28/2014   Hip pain 12/18/2013   Chronic renal failure 05/17/2013   Abnormal CT scan, chest 05/17/2013   Dyspnea 05/16/2013   Cough 05/06/2013   Cerebral infarction ([place]) 12/05/2012   Breast cancer ([place]) 08/01/2012    Diaphragmatic stimulation 72/53/6644   Chronic systolic  heart failure ([place]) 12/20/2011   Long term (current) use of anticoagulants 11/10/2011   Cardiomyopathy, ischemic 11/02/2011   Biventricular defibrillator-St. Jude 11/02/2011   Sinus bradycardia 10/01/2011   Ventricular fibrillation ([place]) 09/26/2011   Left bundle branch block 05/25/2010   CAROTID ARTERY STENOSIS, WITHOUT INFARCTION 09/15/2009   ADENOCARCINOMA, BREAST, HX OF 09/26/2008   Atrial fibrillation ([place]) 07/09/2008   HYPERLIPIDEMIA, MIXED 01/01/2007   Essential hypertension, benign 01/01/2007   Coronary atherosclerosis 01/01/2007   Mild intermittent asthma in adult without complication 67/67/2094   HYPERTHYROIDISM, HX OF 01/01/2007   PERCUTANEOUS TRANSLUMINAL CORONARY ANGIOPLASTY, HX OF 01/01/2007    Past Surgical History:  Procedure Laterality Date   ABDOMINAL HYSTERECTOMY       BI-VENTRICULAR IMPLANTABLE CARDIOVERTER DEFIBRILLATOR N/A 11/02/2011   Procedure: BI-VENTRICULAR IMPLANTABLE CARDIOVERTER DEFIBRILLATOR  (CRT-D);  Surgeon: Thompson Grayer, MD;  Location: [facility] CATH LAB;  Service: Cardiovascular;  Laterality: N/A;   BIV ICD GENERATOR CHANGEOUT N/A 05/11/2017   Procedure: BIV ICD GENERATOR CHANGEOUT;  Surgeon: [name], MD;  Location: [facility] CV LAB;  Service: Cardiovascular;  Laterality: N/A;   BREAST LUMPECTOMY  08/24/2003   right lumpectomy+sln,T2N0,ERPR+,Her2-   CHOLECYSTECTOMY     COLONOSCOPY     CORONARY STENT PLACEMENT  may and  july 2012   LEFT HEART CATHETERIZATION WITH CORONARY ANGIOGRAM N/A 10/09/2011   Procedure: LEFT HEART CATHETERIZATION WITH CORONARY ANGIOGRAM;  Surgeon: Larey Dresser, MD;  Location: [facility] CATH LAB;  Service: Cardiovascular;  Laterality: N/A;   OOPHORECTOMY     POLYPECTOMY     PTCA     VIDEO BRONCHOSCOPY  10/26/2011   Procedure: VIDEO BRONCHOSCOPY WITHOUT FLUORO;  Surgeon: Raylene Miyamoto, MD;  Location: [facility] ENDOSCOPY;  Service: Endoscopy;  Laterality: Bilateral;     OB History   No obstetric history on file.      Home Medications    Prior to Admission medications   Medication Sig Start Date End Date Taking? Authorizing Provider  acetaminophen (TYLENOL) 325 MG tablet Take 650 mg by mouth every 6 (six) hours as needed for moderate pain or headache.     [provider]  albuterol (PROVENTIL) (2.5 MG/3ML) 0.083% nebulizer solution Take 3 mLs (2.5 mg total) by nebulization every 6 (six) hours as needed for wheezing or shortness of breath. 07/10/17   [name], [name], MD  apixaban (ELIQUIS) 5 MG TABS tablet Take 1 tablet (5 mg total) by mouth 2 (two) times daily. 07/25/17   [name], [name], MD  atorvastatin (LIPITOR) 40 MG tablet Take 1 tablet (40 mg total) by mouth daily. pls keep scheduled appt for future refills 07/25/17   [name], [name], MD  bisoprolol (ZEBETA) 5 MG tablet TAKE 1  TABLET DAILY 02/07/18   [name], [name], MD  Blood Glucose Monitoring Suppl (ONE TOUCH ULTRA 2) w/Device KIT Use to check blood sugars daily Dx e11.9 04/03/16   [name], [name], MD  cetirizine (ZYRTEC) 10 MG tablet Take 1 tablet (10 mg total) by mouth as needed for allergies. Patient taking differently: Take 10 mg by mouth every morning.  05/19/16   [name], [name], MD  fluticasone furoate-vilanterol (BREO ELLIPTA) 100-25 MCG/INH AEPB Inhale 1 puff into the lungs daily. Patient taking differently: Inhale 1 puff into the lungs daily as needed (shortness of breath).  07/02/17   [name], [name], MD  furosemide (LASIX) 40 MG tablet Take 1 tablet (40 mg total) by mouth daily. Must keep scheduled appt for future refills 01/21/18   [name],  [name], MD  glucose blood (ONE TOUCH ULTRA TEST) test strip 1 each by Other route daily as needed for other. Use to check blood sugars twice a day Dx E11.9 05/15/16   [name], [name], MD  ketoconazole (NIZORAL) 2 % cream Apply 1 application topically daily as needed for irritation. Under breasts 07/25/17   [name], [name], MD  Lancets [facility] ULTRASOFT) lancets 1 each by Other route 2 (two) times daily. Use to check blood sugars twice a day Dx E11.9 05/15/16   [name], [name], MD  metFORMIN (GLUCOPHAGE) 500 MG tablet Take 1 tablet (500 mg total) by mouth daily with breakfast. Must keep scheduled appt for future refills 07/25/17   [name], [name], MD  Multiple Vitamins-Minerals (PRESERVISION AREDS PO) Take 1 capsule by mouth 2 (two) times daily.    [provider]  naproxen sodium (ALEVE) 220 MG tablet Take 220 mg by mouth daily as needed (pain).    [provider]  nitroGLYCERIN (NITROSTAT) 0.4 MG SL tablet Place 1 tablet (0.4 mg total) under the tongue every 5 (five) minutes x 3 doses as needed for chest pain. Chest pain 05/14/17   [name], MD  oxyCODONE-acetaminophen (PERCOCET) 5-325 MG tablet Take 1-2 tablets  by mouth every 4 (four) hours as needed. 01/28/18   Pollina, Gwenyth Allegra, MD  potassium chloride SA (K-DUR,KLOR-CON) 20 MEQ tablet Take 1 tablet (20 mEq total) by mouth daily. 07/25/17   [name], [name], MD  Respiratory Therapy Supplies (NEBULIZER) DEVI Use with solution Q6H PRN wheezing or shortness of breath 07/10/17   [name], [name], MD  telmisartan (MICARDIS) 40 MG tablet Take 1 tablet (40 mg total) by mouth daily. 07/25/17   [name], [name], MD  triamcinolone ointment (KENALOG) 0.5 % Apply 1 application topically 2 (two) times daily. On the leg rash Patient taking differently: Apply 1 application topically 2 (two) times daily as needed (leg rash).  07/25/17   [name], [name], MD    Family History Family History  Problem Relation Age of Onset   Uterine cancer Mother    Hypertension Mother    Coronary artery disease Father    Heart disease Brother    Heart disease Sister    Endometrial cancer Sister    Heart disease Brother    Clotting disorder Brother        blood clot   Colon cancer Neg Hx     Social History Social History   Tobacco Use   Smoking status: Never Smoker   Smokeless tobacco: Never Used  Substance Use Topics   Alcohol use: No    Alcohol/week: 0.0 standard drinks   Drug use: No     Allergies   Coreg [carvedilol]; [name] [trandolapril]; Meperidine hcl; Penicillins; Molds & smuts; Tape; and Tetanus toxoids   Review of Systems Review of Systems  All other systems reviewed and are negative.    Physical Exam Updated Vital Signs BP (!) 185/77    Pulse (!) 59    Temp 98.9 F (37.2 C) (Oral)    Resp 13    SpO2 96%   Physical Exam Vitals signs and nursing note reviewed.  Constitutional:      General: She is not in acute distress.    Appearance: She is well-developed.  HENT:     Head: Normocephalic and atraumatic.     Right Ear: External ear normal.     Left Ear: External ear normal.  Eyes:     General: No  scleral  icterus.       Right eye: No discharge.        Left eye: No discharge.     Conjunctiva/sclera: Conjunctivae normal.  Neck:     Musculoskeletal: Neck supple.     Trachea: No tracheal deviation.  Cardiovascular:     Rate and Rhythm: Normal rate and regular rhythm.  Pulmonary:     Effort: Pulmonary effort is normal. No respiratory distress.     Breath sounds: Normal breath sounds. No stridor. No wheezing or rales.  Abdominal:     General: Bowel sounds are normal. There is no distension.     Palpations: Abdomen is soft.     Tenderness: There is no abdominal tenderness. There is no guarding or rebound.  Musculoskeletal:        General: No tenderness.  Skin:    General: Skin is warm and dry.     Findings: No rash.  Neurological:     Mental Status: She is alert and oriented to person, place, and time.     Cranial Nerves: No cranial nerve deficit (No facial droop, extraocular movements intact, tongue midline ).     Sensory: No sensory deficit.     Motor: No abnormal muscle tone or seizure activity.     Coordination: Coordination normal.     Comments: No pronator drift bilateral upper extrem, able to hold both legs off bed for 5 seconds, sensation intact in all extremities, no visual field cuts, no left or right sided neglect, normal finger-nose exam bilaterally, no nystagmus noted       ED Treatments / Results  Labs (all labs ordered are listed, but only abnormal results are displayed) Labs Reviewed  COMPREHENSIVE METABOLIC PANEL - Abnormal; Notable for the following components:      Result Value   Potassium 3.3 (*)    Glucose, Bld 147 (*)    GFR calc non Af Amer 59 (*)    All other components within normal limits  CBC WITH DIFFERENTIAL/PLATELET - Abnormal; Notable for the following components:   MCH 25.9 (*)    All other components within normal limits  CBG MONITORING, ED - Abnormal; Notable for the following components:   Glucose-Capillary 129 (*)    All other components  within normal limits  SARS CORONAVIRUS 2  URINALYSIS, ROUTINE W REFLEX MICROSCOPIC  VITAMIN B12  VITAMIN B1  FOLATE    EKG EKG Interpretation  Date/Time:  Tuesday June 25 2018 14:37:35 EDT Ventricular Rate:  60 PR Interval:    QRS Duration: 151 QT Interval:  470 QTC Calculation: 470 R Axis:   -63 Text Interpretation:  Paced rhythm. Right bundle branch block Consider left ventricular hypertrophy Confirmed by Sherwood Gambler [id number]) on 06/25/2018 2:39:44 PM   Radiology Ct Head Wo Contrast  Result Date: 06/25/2018 CLINICAL DATA:  Imbalance, forgetfulness. Expressive aphasia. EXAM: CT HEAD WITHOUT CONTRAST TECHNIQUE: Contiguous axial images were obtained from the base of the skull through the vertex without intravenous contrast. COMPARISON:  12/06/2012. FINDINGS: Brain: No evidence for acute infarction, hemorrhage, mass lesion, or extra-axial fluid. Generalized atrophy, not unexpected for age. Large cortical infarcts affect the LEFT frontal and posterior frontal regions of the brain, with encephalomalacia and regional gliosis. Superimposed is extensive chronic microvascular ischemic change throughout the white matter. Hydrocephalus ex vacuo. Remote LEFT thalamic lacunar infarct could have been acute in 2014. There may be a chronic LEFT brainstem infarct at the lower midbrain/upper pontine level. Vascular: Calcification of the cavernous internal carotid arteries  consistent with cerebrovascular atherosclerotic disease. No signs of intracranial large vessel occlusion. Skull: Calvarium intact. Sinuses/Orbits: No acute findings. Other: None. IMPRESSION: Chronic changes as described. Remote LEFT hemisphere infarcts similar to priors. No acute intracranial findings. Electronically Signed   By: Staci Righter M.D.   On: 06/25/2018 15:19    Procedures Procedures (including critical care time)  Medications Ordered in ED Medications - No data to display   Initial Impression / Assessment and Plan / ED  Course  I have reviewed the triage vital signs and the nursing notes.  Pertinent labs & imaging results that were available during my care of the patient were reviewed by me and considered in my medical decision making (see chart for details).  Clinical Course as of Jun 25 1702  Tue Jun 25, 2018  1619 Will Consult with neurology for their expertise considering her symptoms.      [JK]    Clinical Course User Index [JK] Dorie Rank, MD    Patient presents to the ED with confusion and difficulty with her gait.    Patient has had recent stressors and has not been sleeping well.  This could play a factor.  However, the patient does have difficulty with her gait and has had prior strokes.  Patient was evaluated by neurology.  They recommend admission for further evaluation including MRI.  I will consult the medical service for admission and further treatment.  Final Clinical Impressions(s) / ED Diagnoses   Final diagnoses:  Ataxia  Confusion      Dorie Rank, MD 06/25/18 [phone]

## 2018-06-25 NOTE — H&P (Signed)
History and Physical    Alicia Guerrero HYW:[id number] DOB: [birth year] DOA: 06/25/2018  PCP: Alicia Anger, MD  Patient coming from: Home  I have personally briefly reviewed patient's old medical records in [place]  Chief Complaint: Stroke symptoms, balance issues and memory problems  HPI: Alicia Guerrero is a 83 y.o. female with medical history significant of atrial fibrillation with biventricular implantable cardiac defibrillator, ischemic cardiomyopathy, hypertension, hyperlipidemia, chronic systolic heart failure, and a cardiac arrest in September 2013, malignant neoplasm of the breast who came into the emergency department due to having memory issues that appear to of started approximately 2 weeks ago.  Permission is obtained from the chart and the patient's son.  The patient approximately 12 weeks ago has started taking care of her sister who is been diagnosed with Alzheimer's disease.  Her sister lives in [place].  Patient has only been getting a few hours of sleep due to her sisters anxiety and behavioral issues associated with her dementia.  She has not been eating well because her sister has not been eating.  The past couple of weeks she has become extremely exhausted and recently pulled over into [employer] because she could not recall where she was when [place] was [place].  Son has experience with taking care of ill people and is very concerned that his mother is becoming extraordinarily exhausted.  He has had a slow decline over the past 2 to 3 weeks feels her gait has become unsteady and she has to hold onto things.  His balance issues are new.  All of this has occurred in the past couple of weeks.  Endorses that she has not fallen but feels unsteady when she walks.  She states that this is intermittent and has not changed in severity.  She is not listing to either side.  With regards the patient's getting lost driving home note notes in the chart indicate that the  patient is very forgetful.  Has no fevers or chills.  No vomiting or diarrhea. ED Course: CT scan showed markedly abnormal brain but no acute changes.  EKG shows a paced rhythm with a right bundle branch block, potassium is low at 3.3, glucose elevated at 147  Review of Systems: As per HPI otherwise all other systems reviewed and  negative.   Past Medical History:  Diagnosis Date   Allergic rhinitis due to pollen    Asthma    Atrial fibrillation ([place])    Biventricular ICD (implantable cardiac defibrillator) in place    St Jude 10/2011 (implant MD: Allred)   Bradycardia    a. coreg tapered off 05/2011 => b. Metoprolol restarted after cardiac arrest 09/2011   CAD (coronary artery disease)    a. s/p stent to RCA 1996;  b. s/p cutting balloon PTCA to LAD 2001; c. s/p Cypher DES to LAD 2003;  d. NSTEMI 4/12: DES to dRCA;  e.  [place] 9/13 (after presentation with cardiac arrest and + Nz's for NSTEMI): pLAD 30% ISR, oD1 30%, dLAD 60-70%, pOM1 (small vessel) 90%, oOM2 95% (moderate to large vessel), mCFX 30%, pOM3 30-40%, dRCA stents and pPDA stents patent with 30% ISR => med Rx rec.    Cancer [facility])    Cardiac arrest ([place]) 09/2011   a. VT/VF in community; resusc unsuccessful;  PEA in ED; multiple defibs => revived;  [employer];  c/b by VDRF, cardiogenic shock;  LHC with stable anatomy => med Rx;  difficult ween from vent =>  s/p trach;  d/c to SNF   Chronic eczema    hands   Chronic systolic heart failure (HCC)    Hemorrhoids    HLD (hyperlipidemia)    HTN (hypertension)    Ischemic cardiomyopathy    a. EF 40% at cath 4/12 with AL and Inf HK;  b.  Echo after presentation with cardiac arrest 9/13:  EF 40-45% and severe inf HK to AK c/w infarction, PASP 42   Left bundle branch block    Other and unspecified ovarian cyst    Other atopic dermatitis and related conditions    Personal history of hyperthyroidism    Personal history of malignant neoplasm of breast     Past  Surgical History:  Procedure Laterality Date   ABDOMINAL HYSTERECTOMY     BI-VENTRICULAR IMPLANTABLE CARDIOVERTER DEFIBRILLATOR N/A 11/02/2011   Procedure: BI-VENTRICULAR IMPLANTABLE CARDIOVERTER DEFIBRILLATOR  (CRT-D);  Surgeon: Thompson Grayer, MD;  Location: [facility] CATH LAB;  Service: Cardiovascular;  Laterality: N/A;   BIV ICD GENERATOR CHANGEOUT N/A 05/11/2017   Procedure: BIV ICD GENERATOR CHANGEOUT;  Surgeon: [name], MD;  Location: [facility] CV LAB;  Service: Cardiovascular;  Laterality: N/A;   BREAST LUMPECTOMY  08/24/2003   right lumpectomy+sln,T2N0,ERPR+,Her2-   CHOLECYSTECTOMY     COLONOSCOPY     CORONARY STENT PLACEMENT  may and  july 2012   LEFT HEART CATHETERIZATION WITH CORONARY ANGIOGRAM N/A 10/09/2011   Procedure: LEFT HEART CATHETERIZATION WITH CORONARY ANGIOGRAM;  Surgeon: Larey Dresser, MD;  Location[facility] CATH LAB;  Service: Cardiovascular;  Laterality: N/A;   OOPHORECTOMY     POLYPECTOMY     PTCA     VIDEO BRONCHOSCOPY  10/26/2011   Procedure: VIDEO BRONCHOSCOPY WITHOUT FLUORO;  Surgeon: Raylene Miyamoto, MD;  Location: [facility] ENDOSCOPY;  Service: Endoscopy;  Laterality: Bilateral;    Social History   Social History Narrative   0 caffeine drinks daily      reports that she has never smoked. She has never used smokeless tobacco. She reports that she does not drink alcohol or use drugs.  Allergies  Allergen Reactions   Coreg [Carvedilol] Other (See Comments)    Severe wheezing    [name] [Trandolapril] Cough   Meperidine Hcl Swelling and Other (See Comments)    Mixed with phenergan, swelling of the mouth    Penicillins Other (See Comments)    Pt has taken penicillin about 6 years ago with no side effects   Molds & Smuts Other (See Comments)    Runny nose   Tape Other (See Comments)    Redness, pulls skin off   Tetanus Toxoids Rash    Family History  Problem Relation Age of Onset   Uterine cancer Mother    Hypertension Mother     Coronary artery disease Father    Heart disease Brother    Heart disease Sister    Endometrial cancer Sister    Heart disease Brother    Clotting disorder Brother        blood clot   Colon cancer Neg Hx      Prior to Admission medications   Medication Sig Start Date End Date Taking? Authorizing Provider  apixaban (ELIQUIS) 5 MG TABS tablet Take 1 tablet (5 mg total) by mouth 2 (two) times daily. 07/25/17  Yes [name], [name], MD  atorvastatin (LIPITOR) 40 MG tablet Take 1 tablet (40 mg total) by mouth daily. pls keep scheduled appt for future refills 07/25/17  Yes [name], [name], MD  bisoprolol (ZEBETA) 5 MG tablet TAKE 1 TABLET DAILY Patient taking differently: Take 5 mg by mouth daily.  02/07/18  Yes [name], [name], MD  cetirizine (ZYRTEC) 10 MG tablet Take 1 tablet (10 mg total) by mouth as needed for allergies. Patient taking differently: Take 10 mg by mouth daily.  05/19/16  Yes [name], [name], MD  furosemide (LASIX) 40 MG tablet Take 1 tablet (40 mg total) by mouth daily. Must keep scheduled appt for future refills 01/21/18  Yes [name], [name], MD  metFORMIN (GLUCOPHAGE) 500 MG tablet Take 1 tablet (500 mg total) by mouth daily with breakfast. Must keep scheduled appt for future refills 07/25/17  Yes [name], [name], MD  Multiple Vitamins-Minerals (PRESERVISION AREDS PO) Take 1 capsule by mouth 2 (two) times daily.   Yes [provider]  nitroGLYCERIN (NITROSTAT) 0.4 MG SL tablet Place 1 tablet (0.4 mg total) under the tongue every 5 (five) minutes x 3 doses as needed for chest pain. Chest pain 05/14/17  Yes [name], MD  potassium chloride SA (K-DUR,KLOR-CON) 20 MEQ tablet Take 1 tablet (20 mEq total) by mouth daily. 07/25/17  Yes [name], [name], MD  telmisartan (MICARDIS) 40 MG tablet Take 1 tablet (40 mg total) by mouth daily. 07/25/17  Yes [name], [name], MD  Blood Glucose Monitoring Suppl (ONE TOUCH ULTRA 2) w/Device KIT Use  to check blood sugars daily Dx e11.9 04/03/16   [name], [name], MD  glucose blood (ONE TOUCH ULTRA TEST) test strip 1 each by Other route daily as needed for other. Use to check blood sugars twice a day Dx E11.9 05/15/16   [name], [name], MD  Lancets [facility] ULTRASOFT) lancets 1 each by Other route 2 (two) times daily. Use to check blood sugars twice a day Dx E11.9 05/15/16   [name], [name], MD  Respiratory Therapy Supplies (NEBULIZER) DEVI Use with solution Q6H PRN wheezing or shortness of breath 07/10/17   [name], [name], MD    Physical Exam:  Constitutional: Exhausted appearing and minimally confused but calm and comfortable Vitals:   06/25/18 1600 06/25/18 1702 06/25/18 1730 06/25/18 1745  BP: (!) 188/94 (!) 185/77 (!) 190/70 (!) 193/88  Pulse: 61 (!) 59    Resp: (!) 23 13 19 18   Temp:      TempSrc:      SpO2: 96% 96%     Eyes: PERRL, lids and conjunctivae normal ENMT: Mucous membranes are moist. Posterior pharynx clear of any exudate or lesions.Normal dentition.  Neck: normal, supple, no masses, no thyromegaly Respiratory: clear to auscultation bilaterally, no wheezing, no crackles. Normal respiratory effort. No accessory muscle use.  Cardiovascular: Regular rate and rhythm, no murmurs / rubs / gallops. No extremity edema. 2+ pedal pulses. No carotid bruits.  Abdomen: no tenderness, no masses palpated. No hepatosplenomegaly. Bowel sounds positive.  Musculoskeletal: no clubbing / cyanosis. No joint deformity upper and lower extremities. Good ROM, no contractures. Normal muscle tone.  Skin: no rashes, lesions, ulcers. No induration Neurologic: CN 2-12 grossly intact. Sensation intact, DTR normal. Strength 5/5 in all 4.  UnAble to stand up with her eyes closed Psychiatric: Normal judgment and insight. Alert and oriented x 3.  Exhausted   Labs on Admission: I have personally reviewed following labs and imaging studies  CBC: Recent Labs  Lab 06/25/18 1452    WBC 6.3  NEUTROABS 3.9  HGB 12.9  HCT 41.5  MCV 83.3  PLT 080   Basic Metabolic Panel:  Recent Labs  Lab 06/25/18 1452  NA 142  K 3.3*  CL 106  CO2 23  GLUCOSE 147*  BUN 9  CREATININE 0.92  CALCIUM 9.7   Liver Function Tests: Recent Labs  Lab 06/25/18 1452  AST 19  ALT 16  ALKPHOS 65  BILITOT 0.8  PROT 6.8  ALBUMIN 4.0   CBG: Recent Labs  Lab 06/25/18 1436  GLUCAP 129*   Urine analysis:    Component Value Date/Time   COLORURINE YELLOW 05/19/2015 [place] 05/19/2015 0843   LABSPEC 1.025 05/19/2015 0843   PHURINE 5.5 05/19/2015 0843   GLUCOSEU NEGATIVE 05/19/2015 0843   HGBUR NEGATIVE 05/19/2015 0843   BILIRUBINUR NEGATIVE 05/19/2015 0843   KETONESUR TRACE (A) 05/19/2015 0843   PROTEINUR NEGATIVE 12/05/2012 1305   UROBILINOGEN 0.2 05/19/2015 0843   NITRITE NEGATIVE 05/19/2015 0843   LEUKOCYTESUR TRACE (A) 05/19/2015 0843    Radiological Exams on Admission: Ct Head Wo Contrast  Result Date: 06/25/2018 CLINICAL DATA:  Imbalance, forgetfulness. Expressive aphasia. EXAM: CT HEAD WITHOUT CONTRAST TECHNIQUE: Contiguous axial images were obtained from the base of the skull through the vertex without intravenous contrast. COMPARISON:  12/06/2012. FINDINGS: Brain: No evidence for acute infarction, hemorrhage, mass lesion, or extra-axial fluid. Generalized atrophy, not unexpected for age. Large cortical infarcts affect the LEFT frontal and posterior frontal regions of the brain, with encephalomalacia and regional gliosis. Superimposed is extensive chronic microvascular ischemic change throughout the white matter. Hydrocephalus ex vacuo. Remote LEFT thalamic lacunar infarct could have been acute in 2014. There may be a chronic LEFT brainstem infarct at the lower midbrain/upper pontine level. Vascular: Calcification of the cavernous internal carotid arteries consistent with cerebrovascular atherosclerotic disease. No signs of intracranial large vessel  occlusion. Skull: Calvarium intact. Sinuses/Orbits: No acute findings. Other: None. IMPRESSION: Chronic changes as described. Remote LEFT hemisphere infarcts similar to priors. No acute intracranial findings. Electronically Signed   By: Staci Righter M.D.   On: 06/25/2018 15:19    EKG: Independently reviewed.  Paced rhythm with right bundle branch block  Assessment/Plan Principal Problem:   Balance problem Active Problems:   Essential hypertension, benign   Exhaustion delirium   HYPERLIPIDEMIA, MIXED   Coronary atherosclerosis   Cardiomyopathy, ischemic   Diabetes type 2, controlled ([place])   History of cardioembolic stroke   Biventricular defibrillator-St. Jude   Balance disorder   1.  Balance problem with associated memory issues concern for TIA versus stroke: Patient CT scan is still markedly abnormal from prior strokes including arch cortical infarcts of the left frontal and posterior frontal regions of the brain, encephalomalacia and regional gliosis.  She has superimposed extensive chronic microvascular ischemic changes throughout the white matter she has hydrocephalus ex vacuo moat left thalamic lacunar infarct which was thought to be acute in 2014 chronic left brainstem infarct at the lower midbrain may be contributing to dementia and with the dementia the exhaustion may be bringing to light the seriousness of her neurological issues.  [place] admit her to the hospital and evaluate her for TIA versus stroke an MRI is ordered we are awaiting confirmation the patient's defibrillator is MRI compatible.  She is start aspirin and stroke protocol including PT, OT, and ST.  2.  Severe markedly elevated blood pressures with essential hypertension and given concern for stroke will allow for permissive hypertension.  3.  Exhaustion delirium: Clearly needs rest.  I think that with the significant damage her brain has had over the years from prior stroke  she probably cannot tolerate this degree of  stress and exhaustion.  Her sister will need to get additional help.  Patient can probably give 20% of what she is currently giving in terms of help for her sister.  4.  Mixed hyperlipidemia: Continue home statin.  5.  Coronary atherosclerosis with ischemic cardiomyopathy: Noted continue home medication management.  6.  Diabetes type 2 controlled: Noted sliding scale insulin coverage with home medications.  7.  History of cardioembolic stroke noted above please see CT scan report.  8.  Biventricular defibrillator [facility] Jude noted awaiting determination if MRI compatible.    DVT prophylaxis: Eliquis therapy Code Status: Full code Family Communication: tried son [facility]; husband Jeneen Rinks and spoke at length [phone] Disposition Plan: Hopefully home in 4 to 48 hours Consults called: Stroke team Admission status: Observation  It is my clinical opinion that referral for OBSERVATION is reasonable and necessary in this patient based on the above information provided. The aforementioned taken together are felt to place the patient at high risk for further clinical deterioration. However it is anticipated that the patient may be medically stable for discharge from the hospital within 24 to 48 hours.  Lady Deutscher MD FACP [facility] Pager [phone]  How to contact the [facility] Attending or Consulting provider [place] or covering provider during after hours [place], for this patient?  1. Check the care team in [facility] and look for a) attending/consulting [facility] provider listed and b) the [facility] team listed 2. Log into [email or web address] and use [place]'s universal password to access. If you do not have the password, please contact the hospital operator. 3. Locate the [facility] provider you are looking for under [facility] and page to a number that you can be directly reached. 4. If you still have difficulty reaching the provider, please page the [facility] (Director on Call) for the Hospitalists listed  on amion for assistance.  If 7PM-7AM, please contact night-coverage [email or web address] Password [email or web address]  06/25/2018, 6:03 PM

## 2018-06-25 NOTE — ED Notes (Signed)
ED TO INPATIENT HANDOFF REPORT  ED Nurse Name and Phone #: Suezanne Jacquet [phone]  S Name/Age/Gender Alicia Guerrero 83 y.o. female Room/Bed: 029C/029C  Code Status   Code Status: Full Code  Home/SNF/Other Home Patient oriented to: self, place, time and situation Is this baseline? Yes   Triage Complete: Triage complete  Chief Complaint Loss of balance, stroke Sx, memory loss  Triage Note Pt endorses loss of balance, forgetful of how to do certain simple tasks x 2 weeks. Appears to be having difficulty with expressive aphasia. LSN 2 weeks ago.    Allergies Allergies  Allergen Reactions  . Coreg [Carvedilol] Other (See Comments)    Severe wheezing   . [name] [Trandolapril] Cough  . Meperidine Hcl Swelling and Other (See Comments)    Mixed with phenergan, swelling of the mouth   . Penicillins Other (See Comments)    Pt has taken penicillin about 6 years ago with no side effects  . Molds & Smuts Other (See Comments)    Runny nose  . Tape Other (See Comments)    Redness, pulls skin off  . Tetanus Toxoids Rash    Level of Care/Admitting Diagnosis ED Disposition    ED Disposition Condition [facility] Area: [place] [[id number]]  Level of Care: Telemetry Medical [104]  I expect the patient will be discharged within 24 hours: Yes  LOW acuity---Tx typically complete <24 hrs---ACUTE conditions typically can be evaluated <24 hours---LABS likely to return to acceptable levels <24 hours---IS near functional baseline---EXPECTED to return to current living arrangement---NOT newly hypoxic: Meets criteria for 5C-Observation unit  Covid Evaluation: Screening Protocol (No Symptoms)  Diagnosis: Balance disorder [[id number]]  Admitting Physician: Lady Deutscher [[id number]]  Attending Physician: Lady Deutscher [[id number]]  PT Class (Do Not Modify): Observation [104]  PT Acc Code (Do Not Modify): Observation [10022]       B Medical/Surgery History Past  Medical History:  Diagnosis Date  . Allergic rhinitis due to pollen   . Asthma   . Atrial fibrillation ([place])   . Biventricular ICD (implantable cardiac defibrillator) in place    St Jude 10/2011 (implant MD: Allred)  . Bradycardia    a. coreg tapered off 05/2011 => b. Metoprolol restarted after cardiac arrest 09/2011  . CAD (coronary artery disease)    a. s/p stent to RCA 1996;  b. s/p cutting balloon PTCA to LAD 2001; c. s/p Cypher DES to LAD 2003;  d. NSTEMI 4/12: DES to dRCA;  e.  [place] 9/13 (after presentation with cardiac arrest and + Nz's for NSTEMI): pLAD 30% ISR, oD1 30%, dLAD 60-70%, pOM1 (small vessel) 90%, oOM2 95% (moderate to large vessel), mCFX 30%, pOM3 30-40%, dRCA stents and pPDA stents patent with 30% ISR => med Rx rec.   . Cancer ([place])   . Cardiac arrest ([place]) 09/2011   a. VT/VF in community; resusc unsuccessful;  PEA in ED; multiple defibs => revived;  [employer];  c/b by VDRF, cardiogenic shock;  LHC with stable anatomy => med Rx;  difficult ween from vent => s/p trach;  d/c to SNF  . Chronic eczema    hands  . Chronic systolic heart failure ([place])   . Hemorrhoids   . HLD (hyperlipidemia)   . HTN (hypertension)   . Ischemic cardiomyopathy    a. EF 40% at cath 4/12 with AL and Inf HK;  b.  Echo after presentation with cardiac arrest 9/13:  EF 40-45% and severe  inf HK to AK c/w infarction, PASP 42  . Left bundle branch block   . Other and unspecified ovarian cyst   . Other atopic dermatitis and related conditions   . Personal history of hyperthyroidism   . Personal history of malignant neoplasm of breast    Past Surgical History:  Procedure Laterality Date  . ABDOMINAL HYSTERECTOMY    . BI-VENTRICULAR IMPLANTABLE CARDIOVERTER DEFIBRILLATOR N/A 11/02/2011   Procedure: BI-VENTRICULAR IMPLANTABLE CARDIOVERTER DEFIBRILLATOR  (CRT-D);  Surgeon: Thompson Grayer, MD;  Location[facility] C[facility] LAB;  Service: Cardiovascular;  Laterality: N/A;  . BIV ICD GENERATOR CHANGEOUT N/A  05/11/2017   Procedure: BIV ICD GENERATOR CHANGEOUT;  Surgeon: [name], MD;  Location: [facility] CV LAB;  Service: Cardiovascular;  Laterality: N/A;  . BREAST LUMPECTOMY  08/24/2003   right lumpectomy+sln,T2N0,ERPR+,Her2-  . CHOLECYSTECTOMY    . COLONOSCOPY    . CORONARY STENT PLACEMENT  may and  july 2012  . LEFT HEART CATHETERIZATION WITH CORONARY ANGIOGRAM N/A 10/09/2011   Procedure: LEFT HEART CATHETERIZATION WITH CORONARY ANGIOGRAM;  Surgeon: Larey Dresser, MD;  Location: [facility] CATH LAB;  Service: Cardiovascular;  Laterality: N/A;  . OOPHORECTOMY    . POLYPECTOMY    . PTCA    . VIDEO BRONCHOSCOPY  10/26/2011   Procedure: VIDEO BRONCHOSCOPY WITHOUT FLUORO;  Surgeon: Raylene Miyamoto, MD;  Location: [facility] ENDOSCOPY;  Service: Endoscopy;  Laterality: Bilateral;     A IV Location/Drains/Wounds Patient Lines/Drains/Airways Status   Active Line/Drains/Airways    Name:   Placement date:   Placement time:   Site:   Days:   Peripheral IV 06/25/18 Left Forearm   06/25/18    1934    Forearm   less than 1   Incision 11/01/11 Neck Medial   11/01/11    1700     2428   Incision 11/02/11 Chest Left;Upper   11/02/11    1645     2427   Incision   -    -        Tracheostomy [place] 6 mm Uncuffed   10/12/11    1630    6 mm   2448          Intake/Output Last 24 hours No intake or output data in the 24 hours ending 06/25/18 1935  Labs/Imaging Results for orders placed or performed during the hospital encounter of 06/25/18 (from the past 48 hour(s))  CBG monitoring, ED     Status: Abnormal   Collection Time: 06/25/18  2:36 PM  Result Value Ref Range   Glucose-Capillary 129 (H) 70 - 99 mg/dL   Comment 1 Notify RN    Comment 2 Document in Chart   Comprehensive metabolic panel     Status: Abnormal   Collection Time: 06/25/18  2:52 PM  Result Value Ref Range   Sodium 142 135 - 145 mmol/L   Potassium 3.3 (L) 3.5 - 5.1 mmol/L   Chloride 106 98 - 111 mmol/L   CO2 23 22 - 32 mmol/L    Glucose, Bld 147 (H) 70 - 99 mg/dL   BUN 9 8 - 23 mg/dL   Creatinine, Ser 0.92 0.44 - 1.00 mg/dL   Calcium 9.7 8.9 - 10.3 mg/dL   Total Protein 6.8 6.5 - 8.1 g/dL   Albumin 4.0 3.5 - 5.0 g/dL   AST 19 15 - 41 U/L   ALT 16 0 - 44 U/L   Alkaline Phosphatase 65 38 - 126 U/L   Total Bilirubin 0.8  0.3 - 1.2 mg/dL   GFR calc non Af Amer 59 (L) >60 mL/min   GFR calc Af Amer >60 >60 mL/min   Anion gap 13 5 - 15    Comment: Performed at [facility] [address] [place], [place] [address]  CBC with Differential     Status: Abnormal   Collection Time: 06/25/18  2:52 PM  Result Value Ref Range   WBC 6.3 4.0 - 10.5 K/uL   RBC 4.98 3.87 - 5.11 MIL/uL   Hemoglobin 12.9 12.0 - 15.0 g/dL   HCT 41.5 36.0 - 46.0 %   MCV 83.3 80.0 - 100.0 fL   MCH 25.9 (L) 26.0 - 34.0 pg   MCHC 31.1 30.0 - 36.0 g/dL   RDW 13.7 11.5 - 15.5 %   Platelets 269 150 - 400 K/uL   nRBC 0.0 0.0 - 0.2 %   Neutrophils Relative % 62 %   Neutro Abs 3.9 1.7 - 7.7 K/uL   Lymphocytes Relative 23 %   Lymphs Abs 1.5 0.7 - 4.0 K/uL   Monocytes Relative 9 %   Monocytes Absolute 0.6 0.1 - 1.0 K/uL   Eosinophils Relative 5 %   Eosinophils Absolute 0.3 0.0 - 0.5 K/uL   Basophils Relative 1 %   Basophils Absolute 0.1 0.0 - 0.1 K/uL   Immature Granulocytes 0 %   Abs Immature Granulocytes 0.02 0.00 - 0.07 K/uL    Comment: Performed at [facility] [address] [place], [place] [address]  SARS Coronavirus 2     Status: None   Collection Time: 06/25/18  4:04 PM  Result Value Ref Range   SARS Coronavirus 2 NOT DETECTED NOT DETECTED    Comment: (NOTE) SARS-CoV-2 target nucleic acids are NOT DETECTED. The SARS-CoV-2 RNA is generally detectable in upper and lower respiratory specimens during the acute phase of infection.  Negative  results do not preclude SARS-CoV-2 infection, do not rule out co-infections with other pathogens, and should not be used as the sole basis for treatment or other patient management  decisions.  Negative results must be combined with clinical observations, patient history, and epidemiological information. The expected result is Not Detected. Fact Sheet for Patients: http://www.biofiredefense.com/wp-content/uploads/2020/03/BIOFIRE-COVID -19-patients.pdf Fact Sheet for Healthcare Providers: http://www.biofiredefense.com/wp-content/uploads/2020/03/BIOFIRE-COVID -19-hcp.pdf This test is not yet approved or cleared by the [place] and  has been authorized for detection and/or diagnosis of SARS-CoV-2 by FDA under an Emergency Use Authorization (EUA).  This EUA will remain in effec t (meaning this test can be used) for the duration of  the COVID-19 declaration under Section 564(b)(1) of the Act, 21 U.S.C. section 360bbb-3(b)(1), unless the authorization is terminated or revoked sooner. Performed at [facility], [place] [address] [place], [place] [address]   Vitamin B12     Status: None   Collection Time: 06/25/18  5:01 PM  Result Value Ref Range   Vitamin B-12 193 180 - 914 pg/mL    Comment: (NOTE) This assay is not validated for testing neonatal or myeloproliferative syndrome specimens for Vitamin B12 levels. Performed at [facility], [place] [address] [place], [place] [address]   Folate     Status: None   Collection Time: 06/25/18  5:01 PM  Result Value Ref Range   Folate 17.5 >5.9 ng/mL    Comment: Performed at [facility], [place] [address], [place], [place] [address]  TSH     Status: None   Collection Time: 06/25/18  6:26 PM  Result Value Ref Range   TSH 2.054 0.350 - 4.500 uIU/mL    Comment: Performed by a 3rd Generation assay with a functional sensitivity of <=0.01 uIU/mL. Performed at [facility], [place] [address], [place], [place] [address]    Ct Head Wo Contrast  Result Date: 06/25/2018 CLINICAL DATA:  Imbalance, forgetfulness. Expressive aphasia. EXAM: CT HEAD WITHOUT CONTRAST TECHNIQUE: Contiguous axial images were  obtained from the base of the skull through the vertex without intravenous contrast. COMPARISON:  12/06/2012. FINDINGS: Brain: No evidence for acute infarction, hemorrhage, mass lesion, or extra-axial fluid. Generalized atrophy, not unexpected for age. Large cortical infarcts affect the LEFT frontal and posterior frontal regions of the brain, with encephalomalacia and regional gliosis. Superimposed is extensive chronic microvascular ischemic change throughout the white matter. Hydrocephalus ex vacuo. Remote LEFT thalamic lacunar infarct could have been acute in 2014. There may be a chronic LEFT brainstem infarct at the lower midbrain/upper pontine level. Vascular: Calcification of the cavernous internal carotid arteries consistent with cerebrovascular atherosclerotic disease. No signs of intracranial large vessel occlusion. Skull: Calvarium intact. Sinuses/Orbits: No acute findings. Other: None. IMPRESSION: Chronic changes as described. Remote LEFT hemisphere infarcts similar to priors. No acute intracranial findings. Electronically Signed   By: Staci Righter M.D.   On: 06/25/2018 15:19    Pending Labs Unresulted Labs (From admission, onward)    Start     Ordered   06/26/18 0500  Hemoglobin A1c  Tomorrow morning,   R     06/25/18 1802   06/26/18 0500  Lipid panel  Tomorrow morning,   R    Comments:  Fasting    06/25/18 1802   06/25/18 1649  Vitamin B1  Once,   R    Question:  Specimen collection method  Answer:  IV Team   06/25/18 1649   06/25/18 1428  Urinalysis, Routine w reflex microscopic  ONCE - STAT,   STAT     06/25/18 1427          Vitals/Pain Today's Vitals   06/25/18 1702 06/25/18 1730 06/25/18 1745 06/25/18 1845  BP: (!) 185/77 (!) 190/70 (!) 193/88 (!) 185/96  Pulse: (!) 59   66  Resp: 13 19 18 20   Temp:      TempSrc:      SpO2: 96%   96%  PainSc:        Isolation Precautions No active isolations  Medications Medications  atorvastatin (LIPITOR) tablet 40 mg (has no  administration in time range)  bisoprolol (ZEBETA) tablet 5 mg (has no administration in time range)  furosemide (LASIX) tablet 40 mg (has no administration in time range)  nitroGLYCERIN (NITROSTAT) SL tablet 0.4 mg (has no administration in time range)  irbesartan (AVAPRO) tablet 150 mg (has no administration in time range)  apixaban (ELIQUIS) tablet 5 mg (has no administration in time range)  PreserVision AREDS CAPS (has no administration in time range)  potassium chloride SA (K-DUR) CR tablet 20 mEq (has no administration in time range)  loratadine (CLARITIN) tablet 10 mg (has no administration in time range)   stroke: mapping our early stages of recovery book (has no administration in time range)  acetaminophen (TYLENOL) tablet 650 mg (has no administration in time range)    Or  acetaminophen (TYLENOL) solution 650 mg (has no administration in time range)    Or  acetaminophen (TYLENOL) suppository 650 mg (has no administration in time range)  senna-docusate (Senokot-S) tablet 1 tablet (has no administration  in time range)  0.9 %  sodium chloride infusion (has no administration in time range)  insulin aspart (novoLOG) injection 0-15 Units (has no administration in time range)    Mobility walks with person assist Moderate fall risk   Focused Assessments Neuro Assessment Handoff:  Swallow screen pass? Yes    NIH Stroke Scale ( + Modified Stroke Scale Criteria)  Interval: Initial Level of Consciousness (1a.)   : Alert, keenly responsive LOC Questions (1b. )   +: Answers both questions correctly LOC Commands (1c. )   + : Performs both tasks correctly Best Gaze (2. )  +: Normal Visual (3. )  +: No visual loss Facial Palsy (4. )    : Normal symmetrical movements Motor Arm, Left (5a. )   +: No drift Motor Arm, Right (5b. )   +: No drift Motor Leg, Left (6a. )   +: No drift Motor Leg, Right (6b. )   +: No drift Limb Ataxia (7. ): Absent Sensory (8. )   +: Normal, no sensory  loss Best Language (9. )   +: No aphasia Dysarthria (10. ): Normal Extinction/Inattention (11.)   +: No Abnormality Modified SS Total  +: 0 Complete NIHSS TOTAL: 0 Last date known well: 06/11/18   Neuro Assessment: Exceptions to WDL Neuro Checks:   Initial (06/25/18 1415)  Last Documented NIHSS Modified Score: 0 (06/25/18 1415) Has TPA been given? No If patient is a Neuro Trauma and patient is going to OR before floor call report to [place] nurse: [phone] or [phone]     R Recommendations: See Admitting Provider Note  Report given to:   Additional Notes: N/A

## 2018-06-25 NOTE — ED Triage Notes (Signed)
Pt endorses loss of balance, forgetful of how to do certain simple tasks x 2 weeks. Appears to be having difficulty with expressive aphasia. LSN 2 weeks ago.

## 2018-06-25 NOTE — ED Notes (Signed)
Per pt- has been having balance issues, and been forgetful, increasing in past 2 weeks, has been taking care of her sister who has Alzheimer's for awhile and says that she is under a great deal of stress.  Was driving home last Thursday at 1030pm and got lost- had to call 911 to get her sons to come get her. Also has been having difficulty remembering how to turn on the microwave and how to get in the door, trying to push when it is a pull door.

## 2018-06-25 NOTE — Consult Note (Addendum)
Neurology Consultation  Reason for Consult: gait issues and memory issues Referring Physician: Tomi Bamberger  History is obtained from: Son  HPI: Alicia Guerrero is a 83 y.o. female with history of atrial fibrillation with biventricular implantable cardiac defibrillator- I have called MRI and given them the information awaiting to see if this is a MRI compatible, ischemic cardiomyopathy, hypertension, hyperlipidemia chronic systolic heart failure, cardiac arrest and 9 of 2013, malignant neoplasm of breast.  Patient was brought to the emergency department secondary to having memory issues that appear to have started approximately 2 weeks ago.  Majority of the information is gathered from both the patient but most from her son.  Apparently she has a sister who is suffering from memory decline/Alzheimer's.  Patient went takes care of her and possibly only gets a few hours if that of sleep a night.  It is also noted by son that if her sister does not eat, patient does not eat.  Over the past 2 weeks she has become extremely exhausted.  Recently she was driving down [address] and pulled over to [employer] because she could not recall where she was.  Son states that he has taking care of multiple people in his family and when he looked at his mother he could clearly tell that she was extremely exhausted almost to a point where she is just staring forward.  He does state that this has been a slow decline over the last 2 to 3 weeks with no intermittent periods of complete lucidity.  As far as her gait, he feels as though her gait has become unsteady where she has to hold onto things.  Again this is all occurred in the last couple weeks.  Should be noted that over the past 12 to 10-week she started to take care of her sister and she has had a progressive decline since then.   ROS: A 14 point ROS was performed and is negative except as noted in the HPI.   Past Medical History:  Diagnosis Date  . Allergic rhinitis due to  pollen   . Asthma   . Atrial fibrillation ([place])   . Biventricular ICD (implantable cardiac defibrillator) in place    St Jude 10/2011 (implant MD: Allred)  . Bradycardia    a. coreg tapered off 05/2011 => b. Metoprolol restarted after cardiac arrest 09/2011  . CAD (coronary artery disease)    a. s/p stent to RCA 1996;  b. s/p cutting balloon PTCA to LAD 2001; c. s/p Cypher DES to LAD 2003;  d. NSTEMI 4/12: DES to dRCA;  e.  [place] 9/13 (after presentation with cardiac arrest and + Nz's for NSTEMI): pLAD 30% ISR, oD1 30%, dLAD 60-70%, pOM1 (small vessel) 90%, oOM2 95% (moderate to large vessel), mCFX 30%, pOM3 30-40%, dRCA stents and pPDA stents patent with 30% ISR => med Rx rec.   . Cancer ([place])   . Cardiac arrest ([place]) 09/2011   a. VT/VF in community; resusc unsuccessful;  PEA in ED; multiple defibs => revived;  [employer];  c/b by VDRF, cardiogenic shock;  LHC with stable anatomy => med Rx;  difficult ween from vent => s/p trach;  d/c to SNF  . Chronic eczema    hands  . Chronic systolic heart failure ([place])   . Hemorrhoids   . HLD (hyperlipidemia)   . HTN (hypertension)   . Ischemic cardiomyopathy    a. EF 40% at cath 4/12 with AL and Inf HK;  b.  Echo after presentation with cardiac arrest 9/13:  EF 40-45% and severe inf HK to AK c/w infarction, PASP 42  . Left bundle branch block   . Other and unspecified ovarian cyst   . Other atopic dermatitis and related conditions   . Personal history of hyperthyroidism   . Personal history of malignant neoplasm of breast     Family History  Problem Relation Age of Onset  . Uterine cancer Mother   . Hypertension Mother   . Coronary artery disease Father   . Heart disease Brother   . Heart disease Sister   . Endometrial cancer Sister   . Heart disease Brother   . Clotting disorder Brother        blood clot  . Colon cancer Neg Hx    Social History:   reports that she has never smoked. She has never used smokeless tobacco. She reports  that she does not drink alcohol or use drugs.  Medications No current facility-administered medications for this encounter.   Current Outpatient Medications:  .  acetaminophen (TYLENOL) 325 MG tablet, Take 650 mg by mouth every 6 (six) hours as needed for moderate pain or headache. , Disp: , Rfl:  .  albuterol (PROVENTIL) (2.5 MG/3ML) 0.083% nebulizer solution, Take 3 mLs (2.5 mg total) by nebulization every 6 (six) hours as needed for wheezing or shortness of breath., Disp: 75 mL, Rfl: 3 .  apixaban (ELIQUIS) 5 MG TABS tablet, Take 1 tablet (5 mg total) by mouth 2 (two) times daily., Disp: 180 tablet, Rfl: 3 .  atorvastatin (LIPITOR) 40 MG tablet, Take 1 tablet (40 mg total) by mouth daily. pls keep scheduled appt for future refills, Disp: 90 tablet, Rfl: 3 .  bisoprolol (ZEBETA) 5 MG tablet, TAKE 1 TABLET DAILY, Disp: 90 tablet, Rfl: 2 .  Blood Glucose Monitoring Suppl (ONE TOUCH ULTRA 2) w/Device KIT, Use to check blood sugars daily Dx e11.9, Disp: 1 each, Rfl: 0 .  cetirizine (ZYRTEC) 10 MG tablet, Take 1 tablet (10 mg total) by mouth as needed for allergies. (Patient taking differently: Take 10 mg by mouth every morning. ), Disp: 90 tablet, Rfl: 3 .  fluticasone furoate-vilanterol (BREO ELLIPTA) 100-25 MCG/INH AEPB, Inhale 1 puff into the lungs daily. (Patient taking differently: Inhale 1 puff into the lungs daily as needed (shortness of breath). ), Disp: 1 each, Rfl: 5 .  furosemide (LASIX) 40 MG tablet, Take 1 tablet (40 mg total) by mouth daily. Must keep scheduled appt for future refills, Disp: 90 tablet, Rfl: 1 .  glucose blood (ONE TOUCH ULTRA TEST) test strip, 1 each by Other route daily as needed for other. Use to check blood sugars twice a day Dx E11.9, Disp: 50 each, Rfl: 11 .  ketoconazole (NIZORAL) 2 % cream, Apply 1 application topically daily as needed for irritation. Under breasts, Disp: 60 g, Rfl: 1 .  Lancets (ONETOUCH ULTRASOFT) lancets, 1 each by Other route 2 (two) times  daily. Use to check blood sugars twice a day Dx E11.9, Disp: 100 each, Rfl: 3 .  metFORMIN (GLUCOPHAGE) 500 MG tablet, Take 1 tablet (500 mg total) by mouth daily with breakfast. Must keep scheduled appt for future refills, Disp: 90 tablet, Rfl: 3 .  Multiple Vitamins-Minerals (PRESERVISION AREDS PO), Take 1 capsule by mouth 2 (two) times daily., Disp: , Rfl:  .  naproxen sodium (ALEVE) 220 MG tablet, Take 220 mg by mouth daily as needed (pain)., Disp: , Rfl:  .  nitroGLYCERIN (NITROSTAT)  0.4 MG SL tablet, Place 1 tablet (0.4 mg total) under the tongue every 5 (five) minutes x 3 doses as needed for chest pain. Chest pain, Disp: 20 tablet, Rfl: 3 .  oxyCODONE-acetaminophen (PERCOCET) 5-325 MG tablet, Take 1-2 tablets by mouth every 4 (four) hours as needed., Disp: 20 tablet, Rfl: 0 .  potassium chloride SA (K-DUR,KLOR-CON) 20 MEQ tablet, Take 1 tablet (20 mEq total) by mouth daily., Disp: 90 tablet, Rfl: 3 .  Respiratory Therapy Supplies (NEBULIZER) DEVI, Use with solution Q6H PRN wheezing or shortness of breath, Disp: 1 each, Rfl: 0 .  telmisartan (MICARDIS) 40 MG tablet, Take 1 tablet (40 mg total) by mouth daily., Disp: 90 tablet, Rfl: 3 .  triamcinolone ointment (KENALOG) 0.5 %, Apply 1 application topically 2 (two) times daily. On the leg rash (Patient taking differently: Apply 1 application topically 2 (two) times daily as needed (leg rash). ), Disp: 45 g, Rfl: 3   Exam: Current vital signs: BP (!) 185/86   Pulse 60   Temp 98.9 F (37.2 C) (Oral)   Resp (!) 9   SpO2 94%  Vital signs in last 24 hours: Temp:  [98.9 F (37.2 C)] 98.9 F (37.2 C) (06/09 1421) Pulse Rate:  [60-61] 60 (06/09 1545) Resp:  [9-13] 9 (06/09 1545) BP: (179-192)/(80-108) 185/86 (06/09 1545) SpO2:  [94 %-97 %] 94 % (06/09 1545)  Physical Exam  Constitutional: Appears well-developed and well-nourished.  Psych: Affect appropriate to situation Eyes: No scleral injection HENT: No OP obstrucion Head:  Normocephalic.  Cardiovascular: Normal rate and regular rhythm.  Respiratory: Effort normal, non-labored breathing GI: Soft.  No distension. There is no tenderness.  Skin: WDI  Neuro: Mental Status: Patient is awake, alert, oriented to person, place, month, year, and situation.  Patient could not recall 3 objects, patient was only able to name 4 animals and perseverated on 2 or 3 during the questioning.  When asked how many quarters are in a dollar she would say it for however when I asked about $2 she perseverated on for and tell thinking about it for very long time and then got the correct answer.  She was able to name objects with no difficulty and follow three-step commands  Cranial Nerves: II: Visual Fields are full.  III,IV, VI: EOMI without ptosis or diploplia. Pupils equal, round and reactive to light V: Facial sensation is symmetric to temperature VII: Facial movement is symmetric.  VIII: hearing is intact to voice X: Palat elevates symmetrically XI: Shoulder shrug is symmetric. XII: tongue is midline without atrophy or fasciculations.  Motor: Tone is normal. Bulk is normal. 5/5 strength was present in all four extremities.  Sensory: Sensation is symmetric to light touch and temperature in the arms and legs.  She had proprioception intact, cold sensation was decreased from mid calf down to foot.  Vibratory sensation was intact Deep Tendon Reflexes: 2+ and symmetric in the biceps and no reflexes noted at the patellae.  Plantars: Toes are downgoing bilaterally.  Cerebellar: FNF and HKS are intact bilaterally  Labs I have reviewed labs in epic and the results pertinent to this consultation are:   CBC    Component Value Date/Time   WBC 6.3 06/25/2018 1452   RBC 4.98 06/25/2018 1452   HGB 12.9 06/25/2018 1452   HGB 13.7 04/19/2017 1309   HGB 13.5 08/01/2012 1447   HCT 41.5 06/25/2018 1452   HCT 41.8 04/19/2017 1309   HCT 40.6 08/01/2012 1447   PLT 269 06/25/2018  1452    PLT 289 04/19/2017 1309   MCV 83.3 06/25/2018 1452   MCV 83 04/19/2017 1309   MCV 83.6 08/01/2012 1447   MCH 25.9 (L) 06/25/2018 1452   MCHC 31.1 06/25/2018 1452   RDW 13.7 06/25/2018 1452   RDW 14.6 04/19/2017 1309   RDW 13.9 08/01/2012 1447   LYMPHSABS 1.5 06/25/2018 1452   LYMPHSABS 2.4 05/09/2016 1619   LYMPHSABS 2.0 08/01/2012 1447   MONOABS 0.6 06/25/2018 1452   MONOABS 0.6 08/01/2012 1447   EOSABS 0.3 06/25/2018 1452   EOSABS 0.4 05/09/2016 1619   BASOSABS 0.1 06/25/2018 1452   BASOSABS 0.1 05/09/2016 1619   BASOSABS 0.1 08/01/2012 1447    CMP     Component Value Date/Time   NA 142 06/25/2018 1452   NA 145 (H) 04/19/2017 1309   NA 140 08/01/2012 1447   K 3.3 (L) 06/25/2018 1452   K 4.1 08/01/2012 1447   CL 106 06/25/2018 1452   CO2 23 06/25/2018 1452   CO2 29 08/01/2012 1447   GLUCOSE 147 (H) 06/25/2018 1452   GLUCOSE 104 08/01/2012 1447   BUN 9 06/25/2018 1452   BUN 10 04/19/2017 1309   BUN 13.9 08/01/2012 1447   CREATININE 0.92 06/25/2018 1452   CREATININE 0.8 08/01/2012 1447   CALCIUM 9.7 06/25/2018 1452   CALCIUM 10.1 08/01/2012 1447   PROT 6.8 06/25/2018 1452   PROT 7.0 08/01/2012 1447   ALBUMIN 4.0 06/25/2018 1452   ALBUMIN 3.9 08/01/2012 1447   AST 19 06/25/2018 1452   AST 20 08/01/2012 1447   ALT 16 06/25/2018 1452   ALT 17 08/01/2012 1447   ALKPHOS 65 06/25/2018 1452   ALKPHOS 73 08/01/2012 1447   BILITOT 0.8 06/25/2018 1452   BILITOT 0.86 08/01/2012 1447   GFRNONAA 59 (L) 06/25/2018 1452   GFRAA >60 06/25/2018 1452    Lipid Panel     Component Value Date/Time   CHOL 183 05/19/2015 0843   TRIG 166.0 (H) 05/19/2015 0843   HDL 46.50 05/19/2015 0843   CHOLHDL 4 05/19/2015 0843   VLDL 33.2 05/19/2015 0843   LDLCALC 103 (H) 05/19/2015 0843   LDLDIRECT 91.1 03/21/2010 0925     Imaging I have reviewed the images obtained:  CT-scan of the brain-chronic changes.  Remote left hemispheric infarct similar to prior studies.  MRI  examination of the brain-I discussed with MRI and the ICD is a conditional ICD they will be looking further into this   Assessment:  82 year old female with progressive decline in  memory/short-term memory over the past 2 weeks as well as some question of gait problem as well.  This is occurred in the setting of lack of rest/sleep/nutrition.  She does have atrial fibrillation with ICD and on Eliquis.  I do very much question if patient's issues are secondary to exhaustion and possibly decline in mentation that is underlying with exhaustion and physical stress   Recommendations: -MRI if possible -B12, tsh -PT/OT   Etta Quill PA-C [facility] [phone]  M-F  (9:00 am- 5:00 PM)  06/25/2018, 4:25 PM   I have seen the patient reviewed the above note.  She has had progressive problems which she has noted since the middle of last week.  She states that her husband noticed it a little while before that.  I do wonder whether this could simply be exhaustion/stress in the setting of possibly some mild underlying neurocognitive issue.  With the relative abrupt as described, I think an MRI would  be reasonable if it could be obtained.  If not, then I would treat this is more of a mild delirious state due to lack of sleep.  Roland Rack, MD [facility] [phone]  If 7pm- 7am, please page neurology on call as listed in [place].

## 2018-06-25 NOTE — Telephone Encounter (Signed)
My wife went to [place] with her sister who has dementia.   Stayed there for 12 wks.   Wife started home from [place]. And got lost.   I got ahold of her on her cell phone.   She didn't know where she was.   I called the police.   They found her in the [place] parking lot.   I went and brought her home. She was forgetful.    Over the past 2 days she is very forgetful.   She can't turn on the microwave today. I instructed him to take her to the ED.  She needs to be checked out. I will call my son to take [place] to [facility] ED now.   Husband can't drive.  I let him know I would let Dr. Alain Marion know.  I forwarded these notes to his office.

## 2018-06-25 NOTE — ED Notes (Signed)
Pt's husband updated on phone

## 2018-06-25 NOTE — ED Notes (Signed)
Attempted report 

## 2018-06-26 ENCOUNTER — Observation Stay (HOSPITAL_BASED_OUTPATIENT_CLINIC_OR_DEPARTMENT_OTHER): Payer: Medicare HMO

## 2018-06-26 ENCOUNTER — Observation Stay (HOSPITAL_COMMUNITY): Payer: Medicare HMO

## 2018-06-26 DIAGNOSIS — R2689 Other abnormalities of gait and mobility: Secondary | ICD-10-CM | POA: Diagnosis not present

## 2018-06-26 DIAGNOSIS — Z95810 Presence of automatic (implantable) cardiac defibrillator: Secondary | ICD-10-CM | POA: Diagnosis not present

## 2018-06-26 DIAGNOSIS — E119 Type 2 diabetes mellitus without complications: Secondary | ICD-10-CM | POA: Diagnosis not present

## 2018-06-26 DIAGNOSIS — I4891 Unspecified atrial fibrillation: Secondary | ICD-10-CM

## 2018-06-26 DIAGNOSIS — R69 Illness, unspecified: Secondary | ICD-10-CM | POA: Diagnosis not present

## 2018-06-26 DIAGNOSIS — R413 Other amnesia: Secondary | ICD-10-CM | POA: Diagnosis not present

## 2018-06-26 DIAGNOSIS — I255 Ischemic cardiomyopathy: Secondary | ICD-10-CM | POA: Diagnosis not present

## 2018-06-26 LAB — LIPID PANEL
Cholesterol: 203 mg/dL — ABNORMAL HIGH (ref 0–200)
HDL: 44 mg/dL (ref >40)
LDL Cholesterol: 120 mg/dL — ABNORMAL HIGH (ref 0–99)
Total CHOL/HDL Ratio: 4.6 RATIO
Triglycerides: 196 mg/dL — ABNORMAL HIGH (ref <150)
VLDL: 39 mg/dL (ref 0–40)

## 2018-06-26 LAB — HEMOGLOBIN A1C
Hgb A1c MFr Bld: 6.4 % — ABNORMAL HIGH (ref 4.8–5.6)
Mean Plasma Glucose: 136.98 mg/dL

## 2018-06-26 LAB — GLUCOSE, CAPILLARY
Glucose-Capillary: 107 mg/dL — ABNORMAL HIGH (ref 70–99)
Glucose-Capillary: 114 mg/dL — ABNORMAL HIGH (ref 70–99)
Glucose-Capillary: 179 mg/dL — ABNORMAL HIGH (ref 70–99)

## 2018-06-26 LAB — ECHOCARDIOGRAM COMPLETE

## 2018-06-26 MED ORDER — CYANOCOBALAMIN 1000 MCG/ML IJ SOLN
1000.0000 ug | Freq: Once | INTRAMUSCULAR | Status: AC
  Administered 2018-06-26: 1000 ug via INTRAMUSCULAR
  Filled 2018-06-26: qty 1

## 2018-06-26 NOTE — Discharge Summary (Signed)
Discharge Summary  Alicia Guerrero OMV:[id number] DOB: 1935/06/19  PCP: [name], MD  Admit date: 06/25/2018 Discharge date: 06/26/2018  Time spent: 29mns, more than 50% time spent on coordination of care. Case discussed with neurology  Observation status  Recommendations for Outpatient Follow-up:  1. F/u with PCP within a week  for hospital discharge follow up, repeat cbc/bmp at follow up. pcp to check methylmalonic acid level, consider parental b12 supplement if methylmalonic acid level is low, pcp to refer patient to have neuropsychology testing 2. F/u with neurology as needed for memory issues 3. Home health arranged, PR/RN, social worker for caregiver exhaustion , patient is a main caregiver for her sister who has progressive dementia 4. Continue all home meds, no new meds at discharge  Discharge Diagnoses:  Active Hospital Problems   Diagnosis Date Noted   Balance problem 06/25/2018   History of cardioembolic stroke 062/[id number]  Exhaustion delirium 06/25/2018   Diabetes mellitus type 2, noninsulin dependent ([place] 05/14/2015   Cardiomyopathy, ischemic 11/02/2011   Biventricular defibrillator-St. Jude 11/02/2011   HYPERLIPIDEMIA, MIXED 01/01/2007   Essential hypertension, benign 01/01/2007   Coronary atherosclerosis 01/01/2007    Resolved Hospital Problems  No resolved problems to display.    Discharge Condition: stable  Diet recommendation: heart healthy/carb modified  There were no vitals filed for this visit.  History of present illness: (per admitting MD Dr SEvangeline Gula PCP: PCassandria Anger MD  Patient coming from: Home  I have personally briefly reviewed [place] Chief Complaint: Stroke symptoms, balance issues and memory problems  HPI: Alicia Batteyis a 83y.o. female with medical history significant of atrial fibrillation with biventricular implantable cardiac defibrillator, ischemic  cardiomyopathy, hypertension, hyperlipidemia, chronic systolic heart failure, and a cardiac arrest in September 2013, malignant neoplasm of the breast who came into the emergency department due to having memory issues that appear to of started approximately 2 weeks ago.  Permission is obtained from the chart and the patient's son.  The patient approximately 12 weeks ago has started taking care of her sister who is been diagnosed with Alzheimer's disease.  [place] sister [place] in [place]  Patient has only been getting a few hours of sleep due to her sisters anxiety and behavioral issues associated with her dementia.  She has not been eating well because her sister has not been eating.  The past couple of weeks she has become extremely exhausted and recently pulled over into [employer] because she could not recall where she was when she was on [address]  Son has experience with taking care of ill people and is very concerned that his mother is becoming extraordinarily exhausted.  He has had a slow decline over the past 2 to 3 weeks feels her gait has become unsteady and she has to hold onto things.  His balance issues are new.  All of this has occurred in the past couple of weeks.  Endorses that she has not fallen but feels unsteady when she walks.  She states that this is intermittent and has not changed in severity.  She is not listing to either side.  With regards the patient's getting lost driving home note notes in the chart indicate that the patient is very forgetful.  Has no fevers or chills.  No vomiting or diarrhea. ED Course: CT scan showed markedly abnormal brain but no acute changes.  EKG shows a paced rhythm with a right bundle branch  block, potassium is low at 3.3, glucose elevated at 147  Hospital Course:  Principal Problem:   Balance problem Active Problems:   HYPERLIPIDEMIA, MIXED   Essential hypertension, benign   Coronary atherosclerosis   Cardiomyopathy, ischemic   Biventricular  defibrillator-St. Jude   Diabetes mellitus type 2, noninsulin dependent (HCC)   History of cardioembolic stroke   Exhaustion delirium  Exhaustion delirium:  -patient reports she is the main caregiver to her sister who has progressive dementia, -she presents with progressive decline in  memory/short-term memory over the past 2 weeks as well as some question of gait problem as well.  This is occurred in the setting of lack of rest/sleep/nutrition -MRI no acute infarct, echo lvef 55%-60%, b12 level wnl -she received im b12 injection per neurology recommendation, neurology recommend check methylmalonic acid level and consider weekly injection if MMA level is low -she likely needs outpatient psychotherapy to help with the stress associated with caring for her sister. -outpatient neuropsychology testing per neurology recommendation -she is to follow up with pcp, pcp to refer to neuropsychology testing Home health RN/PT arranged    Afib with ischemic cardiomyopathy , h/o cardiac arrest s/p biv ICD , h/o cardioembolic cva On eliquis, bisoprolol, micardis, lasix, stable at baseline  Hyperlipidemia Continue statin  Hypokalemia: Replace k  noninsulin dependent dm2, controlled a1c 6.4 Continue home meds metformin   Covid 19 screening negative  Procedures:  none  Consultations:  Neurology  Case manager   Discharge Exam: BP (!) 159/76 (BP Location: Left Arm)    Pulse 64    Temp 98.6 F (37 C) (Oral)    Resp 16    SpO2 97%   General: NAD Cardiovascular: RRR Respiratory: CTABL  Discharge Instructions You were cared for by a hospitalist during your hospital stay. If you have any questions about your discharge medications or the care you received while you were in the hospital after you are discharged, you can call the unit and asked to speak with the hospitalist on call if the hospitalist that took care of you is not available. Once you are discharged, your primary care physician  will handle any further medical issues. Please note that NO REFILLS for any discharge medications will be authorized once you are discharged, as it is imperative that you return to your primary care physician (or establish a relationship with a primary care physician if you do not have one) for your aftercare needs so that they can reassess your need for medications and monitor your lab values.  Discharge Instructions    Diet - low sodium heart healthy   Complete by:  As directed    Increase activity slowly   Complete by:  As directed      Allergies as of 06/26/2018      Reactions   Coreg [carvedilol] Other (See Comments)   Severe wheezing   [name] [trandolapril] Cough   Meperidine Hcl Swelling, Other (See Comments)   Mixed with phenergan, swelling of the mouth    Penicillins Other (See Comments)   Pt has taken penicillin about 6 years ago with no side effects   Molds & Smuts Other (See Comments)   Runny nose   Tape Other (See Comments)   Redness, pulls skin off   Tetanus Toxoids Rash      Medication List    TAKE these medications   apixaban 5 MG Tabs tablet Commonly known as:  Eliquis Take 1 tablet (5 mg total) by mouth 2 (two) times daily.  Notes to patient:  This evening.   atorvastatin 40 MG tablet Commonly known as:  LIPITOR Take 1 tablet (40 mg total) by mouth daily. pls keep scheduled appt for future refills Notes to patient:  Tomorrow morning.   bisoprolol 5 MG tablet Commonly known as:  ZEBETA TAKE 1 TABLET DAILY   cetirizine 10 MG tablet Commonly known as:  ZYRTEC Take 1 tablet (10 mg total) by mouth as needed for allergies. What changed:  when to take this   furosemide 40 MG tablet Commonly known as:  LASIX Take 1 tablet (40 mg total) by mouth daily. Must keep scheduled appt for future refills Notes to patient:  Tomorrow morning.   glucose blood test strip Commonly known as:  ONE TOUCH ULTRA TEST 1 each by Other route daily as needed for other. Use to  check blood sugars twice a day Dx E11.9   metFORMIN 500 MG tablet Commonly known as:  Glucophage Take 1 tablet (500 mg total) by mouth daily with breakfast. Must keep scheduled appt for future refills   Nebulizer Devi Use with solution Q6H PRN wheezing or shortness of breath   nitroGLYCERIN 0.4 MG SL tablet Commonly known as:  NITROSTAT Place 1 tablet (0.4 mg total) under the tongue every 5 (five) minutes x 3 doses as needed for chest pain. Chest pain   ONE TOUCH ULTRA 2 w/Device Kit Use to check blood sugars daily Dx e11.9   onetouch ultrasoft lancets 1 each by Other route 2 (two) times daily. Use to check blood sugars twice a day Dx E11.9   potassium chloride SA 20 MEQ tablet Commonly known as:  K-DUR Take 1 tablet (20 mEq total) by mouth daily. Notes to patient:  Tomorrow morning.   PRESERVISION AREDS PO Take 1 capsule by mouth 2 (two) times daily. Notes to patient:  This evening.   telmisartan 40 MG tablet Commonly known as:  Micardis Take 1 tablet (40 mg total) by mouth daily.      Allergies  Allergen Reactions   Coreg [Carvedilol] Other (See Comments)    Severe wheezing    [name] [Trandolapril] Cough   Meperidine Hcl Swelling and Other (See Comments)    Mixed with phenergan, swelling of the mouth    Penicillins Other (See Comments)    Pt has taken penicillin about 6 years ago with no side effects   Molds & Smuts Other (See Comments)    Runny nose   Tape Other (See Comments)    Redness, pulls skin off   Tetanus Toxoids Rash   Follow-up Information    [name], [name], MD Follow up in 1 week(s).   Specialty:  Internal Medicine Why:  hospital discharge follow up Contact information: [place] [address] [phone]        [facility] Follow up.   Why:  as needed for memory issues  Contact information: [address]     [place] [id number] [phone]           The  results of significant diagnostics from this hospitalization (including imaging, microbiology, ancillary and laboratory) are listed below for reference.    Significant Diagnostic Studies: Ct Head Wo Contrast  Result Date: 06/25/2018 CLINICAL DATA:  Imbalance, forgetfulness. Expressive aphasia. EXAM: CT HEAD WITHOUT CONTRAST TECHNIQUE: Contiguous axial images were obtained from the base of the skull through the vertex without intravenous contrast. COMPARISON:  12/06/2012. FINDINGS: Brain: No evidence for acute infarction, hemorrhage, mass lesion,  or extra-axial fluid. Generalized atrophy, not unexpected for age. Large cortical infarcts affect the LEFT frontal and posterior frontal regions of the brain, with encephalomalacia and regional gliosis. Superimposed is extensive chronic microvascular ischemic change throughout the white matter. Hydrocephalus ex vacuo. Remote LEFT thalamic lacunar infarct could have been acute in 2014. There may be a chronic LEFT brainstem infarct at the lower midbrain/upper pontine level. Vascular: Calcification of the cavernous internal carotid arteries consistent with cerebrovascular atherosclerotic disease. No signs of intracranial large vessel occlusion. Skull: Calvarium intact. Sinuses/Orbits: No acute findings. Other: None. IMPRESSION: Chronic changes as described. Remote LEFT hemisphere infarcts similar to priors. No acute intracranial findings. Electronically Signed   By: Staci Righter M.D.   On: 06/25/2018 15:19   Mr Brain Wo Contrast  Result Date: 06/26/2018 CLINICAL DATA:  Memory decline over the past 2 weeks. EXAM: MRI HEAD WITHOUT CONTRAST TECHNIQUE: Multiplanar, multiecho pulse sequences of the brain and surrounding structures were obtained without intravenous contrast. COMPARISON:  Head CT 06/25/2018 FINDINGS: Brain: There is no evidence of acute infarct, intracranial hemorrhage, mass, midline shift, or extra-axial fluid collection. Chronic moderate-sized infarcts are  noted in the left frontal and left parietal lobes. Chronic lacunar infarcts are noted in the left thalamus and at the midbrain-pons junction just left of midline. Patchy to confluent T2 hyperintensities in the cerebral white matter bilaterally are nonspecific but compatible with moderate to severe chronic small vessel ischemic disease. There is moderate cerebral atrophy. Vascular: Major intracranial vascular flow voids are preserved. Skull and upper cervical spine: Unremarkable bone marrow signal. Sinuses/Orbits: Bilateral cataract extraction. Chronic left maxillary sinusitis. Trace mastoid effusions. Other: None. IMPRESSION: 1. No acute intracranial abnormality. 2. Moderate to severe chronic small vessel ischemic disease with multiple chronic infarcts as above. Electronically Signed   By: Logan Bores M.D.   On: 06/26/2018 16:12   Vas US Carotid (at [place] Only)  Result Date: 06/26/2018 Carotid Arterial Duplex Study Indications:       CVA and balance problem. Risk Factors:      Hypertension, hyperlipidemia. Comparison Study:  no prior Performing Technologist: June Leap RDMS, RVT  Examination Guidelines: A complete evaluation includes B-mode imaging, spectral Doppler, color Doppler, and power Doppler as needed of all accessible portions of each vessel. Bilateral testing is considered an integral part of a complete examination. Limited examinations for reoccurring indications may be performed as noted.  Right Carotid Findings: +----------+--------+--------+--------+------------+--------+             PSV cm/s EDV cm/s Stenosis Describe     Comments  +----------+--------+--------+--------+------------+--------+  CCA Prox   51       9                                        +----------+--------+--------+--------+------------+--------+  CCA Distal 62       14                                       +----------+--------+--------+--------+------------+--------+  ICA Prox   65       13       1-39%    heterogenous            +----------+--------+--------+--------+------------+--------+  ICA Distal 62       21                                       +----------+--------+--------+--------+------------+--------+  ECA        66       10                                       +----------+--------+--------+--------+------------+--------+ +----------+--------+-------+----------------+-------------------+             PSV cm/s EDV cms Describe         Arm Pressure (mmHG)  +----------+--------+-------+----------------+-------------------+  Subclavian 68               Multiphasic, WNL                      +----------+--------+-------+----------------+-------------------+ +---------+--------+--+--------+-+---------+  Vertebral PSV cm/s 34 EDV cm/s 8 Antegrade  +---------+--------+--+--------+-+---------+  Left Carotid Findings: +----------+--------+--------+--------+------------+--------+             PSV cm/s EDV cm/s Stenosis Describe     Comments  +----------+--------+--------+--------+------------+--------+  CCA Prox   56       11                                       +----------+--------+--------+--------+------------+--------+  CCA Distal 69       12                                       +----------+--------+--------+--------+------------+--------+  ICA Prox   49       11       1-39%    heterogenous           +----------+--------+--------+--------+------------+--------+  ICA Distal 62       11                                       +----------+--------+--------+--------+------------+--------+  ECA        57       4                                        +----------+--------+--------+--------+------------+--------+ +----------+--------+--------+----------------+-------------------+  Subclavian PSV cm/s EDV cm/s Describe         Arm Pressure (mmHG)  +----------+--------+--------+----------------+-------------------+             98                Multiphasic, WNL                       +----------+--------+--------+----------------+-------------------+ +---------+--------+--+--------+-+---------+  Vertebral PSV cm/s 25 EDV cm/s 5 Antegrade  +---------+--------+--+--------+-+---------+  Summary: Right Carotid: Velocities in the right ICA are consistent with a 1-39% stenosis. Left Carotid: Velocities in the left ICA are consistent with a 1-39% stenosis. Vertebrals:  Bilateral vertebral arteries demonstrate antegrade flow. Subclavians: Normal flow hemodynamics were seen in bilateral subclavian              arteries. *See table(s) above for measurements and observations.  Electronically signed by Antony Contras MD on 06/26/2018 at 12:48:03 PM.    Final     Microbiology: Recent Results (from the past 240 hour(s))  SARS Coronavirus 2  Status: None   Collection Time: 06/25/18  4:04 PM  Result Value Ref Range Status   SARS Coronavirus 2 NOT DETECTED NOT DETECTED Final    Comment: (NOTE) SARS-CoV-2 target nucleic acids are NOT DETECTED. The SARS-CoV-2 RNA is generally detectable in upper and lower respiratory specimens during the acute phase of infection.  Negative  results do not preclude SARS-CoV-2 infection, do not rule out co-infections with other pathogens, and should not be used as the sole basis for treatment or other patient management decisions.  Negative results must be combined with clinical observations, patient history, and epidemiological information. The expected result is Not Detected. Fact Sheet for Patients: http://www.biofiredefense.com/wp-content/uploads/2020/03/BIOFIRE-COVID -19-patients.pdf Fact Sheet for Healthcare Providers: http://www.biofiredefense.com/wp-content/uploads/2020/03/BIOFIRE-COVID -19-hcp.pdf This test is not yet approved or cleared by the [place] and  has been authorized for detection and/or diagnosis of SARS-CoV-2 by FDA under an Emergency Use Authorization (EUA).  This EUA will remain in effec t (meaning this test can be used)  for the duration of  the COVID-19 declaration under Section 564(b)(1) of the Act, 21 U.S.C. section 360bbb-3(b)(1), unless the authorization is terminated or revoked sooner. Performed at [facility], [place] [address], [place], [place] [address]      Labs: Basic Metabolic Panel: Recent Labs  Lab 06/25/18 1452  NA 142  K 3.3*  CL 106  CO2 23  GLUCOSE 147*  BUN 9  CREATININE 0.92  CALCIUM 9.7   Liver Function Tests: Recent Labs  Lab 06/25/18 1452  AST 19  ALT 16  ALKPHOS 65  BILITOT 0.8  PROT 6.8  ALBUMIN 4.0   No results for input(s): LIPASE, AMYLASE in the last 168 hours. No results for input(s): AMMONIA in the last 168 hours. CBC: Recent Labs  Lab 06/25/18 1452  WBC 6.3  NEUTROABS 3.9  HGB 12.9  HCT 41.5  MCV 83.3  PLT 269   Cardiac Enzymes: No results for input(s): CKTOTAL, CKMB, CKMBINDEX, TROPONINI in the last 168 hours. BNP: BNP (last 3 results) No results for input(s): BNP in the last 8760 hours.  ProBNP (last 3 results) No results for input(s): PROBNP in the last 8760 hours.  CBG: Recent Labs  Lab 06/25/18 1436 06/25/18 2108 06/26/18 0608 06/26/18 1221 06/26/18 1647  GLUCAP 129* 151* 107* 114* 179*       Signed:  Florencia Reasons MD, PhD  [facility] 06/26/2018, 10:08 PM

## 2018-06-26 NOTE — Progress Notes (Signed)
06/26/18 1418  PT Visit Information  Last PT Received On 06/26/18  Assistance Needed +1  History of Present Illness Pt is an 83 y/o female admitted secondary to progressive decline in memory and gait difficulty as well. CT with no acute changes, however, awaiting MRI. PMH includes CVA, a fib, s/p defibrilator, CHF, HTN, DM, and ischemic cardiomyopathy.   Precautions  Precautions Fall  Restrictions  Weight Bearing Restrictions No  Home Living  Family/patient expects to be discharged to: Private residence  Living Arrangements Spouse/significant other  Available Help at Discharge Family;Available 24 hours/day  Type of Home House  Home Access Stairs to enter  Entrance Stairs-Number of Steps 3  Entrance Stairs-Rails None  Home Layout One level  Bathroom Shower/Tub Walk-in shower  [place] - single point;Walker - 4 wheels   Lives With Spouse  Prior Function  Level of Independence Independent  Communication  Communication No difficulties  Pain Assessment  Pain Assessment No/denies pain  Cognition  Arousal/Alertness Awake/alert  Behavior During Therapy WFL for tasks assessed/performed  Overall Cognitive Status Impaired/Different from baseline  Area of Impairment Attention;Memory;Following commands;Safety/judgement;Awareness  Current Attention Level Sustained  Memory Decreased short-term memory  Following Commands Follows one step commands with increased time  Safety/Judgement Decreased awareness of deficits;Decreased awareness of safety  Awareness Emergent  Upper Extremity Assessment  Upper Extremity Assessment Defer to OT evaluation  Lower Extremity Assessment  Lower Extremity Assessment Generalized weakness  Cervical / Trunk Assessment  Cervical / Trunk Assessment Normal  Bed Mobility  General bed mobility comments In chair upon entry.   Transfers  Overall transfer level Needs assistance  Equipment used None  Transfers Sit to/from  Stand  Sit to Stand Min guard  General transfer comment Min guard for steadying to stand.   Ambulation/Gait  Ambulation/Gait assistance Min guard  Gait Distance (Feet) 150 Feet  Assistive device None  Gait Pattern/deviations Step-through pattern;Decreased stride length  General Gait Details Slow, unsteady gait. Min guard for steadying assist and pt reaching for hand rails in hall. Educated about use of RW to increase safety with mobility at home and pt in agreeance.   Gait velocity Decreased   Balance  Overall balance assessment Needs assistance  Sitting-balance support No upper extremity supported;Feet supported  Sitting balance-Leahy Scale Good  Standing balance support During functional activity;No upper extremity supported  Standing balance-Leahy Scale Fair  PT - End of Session  Equipment Utilized During Treatment Gait belt  Activity Tolerance Patient tolerated treatment well  Patient left in chair;with call bell/phone within reach;with chair alarm set  Nurse Communication Mobility status  PT Assessment  PT Recommendation/Assessment Patient needs continued PT services  PT Visit Diagnosis Unsteadiness on feet (R26.81);Muscle weakness (generalized) (M62.81)  PT Problem List Decreased strength;Decreased balance;Decreased mobility;Decreased cognition;Decreased knowledge of use of DME;Decreased knowledge of precautions;Decreased safety awareness  PT Plan  PT Frequency (ACUTE ONLY) Min 3X/week  PT Treatment/Interventions (ACUTE ONLY) DME instruction;Stair training;Gait training;Functional mobility training;Therapeutic activities;Balance training;Therapeutic exercise;Patient/family education;Cognitive remediation  AM-PAC PT "6 Clicks" Mobility Outcome Measure (Version 2)  Help needed turning from your back to your side while in a flat bed without using bedrails? 3  Help needed moving from lying on your back to sitting on the side of a flat bed without using bedrails? 3  Help needed moving  to and from a bed to a chair (including a wheelchair)? 3  Help needed standing up from a chair using your arms (e.g., wheelchair or bedside chair)?  3  Help needed to walk in hospital room? 3  Help needed climbing 3-5 steps with a railing?  2  6 Click Score 17  Consider Recommendation of Discharge To: Home with [facility]  PT Recommendation  Follow Up Recommendations Home health PT;Supervision/Assistance - 24 hour  PT equipment Rolling walker with 5" wheels  Individuals Consulted  Consulted and Agree with Results and Recommendations Patient  Acute Rehab PT Goals  Patient Stated Goal to go home  PT Goal Formulation With patient  Time For Goal Achievement 07/10/18  Potential to Achieve Goals Good  PT Time Calculation  PT Start Time (ACUTE ONLY) 1134  PT Stop Time (ACUTE ONLY) 1149  PT Time Calculation (min) (ACUTE ONLY) 15 min  PT General Charges  $$ ACUTE PT VISIT 1 Visit  PT Evaluation  $PT Eval Moderate Complexity 1 Mod  Written Expression  Dominant Hand Left   Pt admitted with problem above and deficits above. Pt with notable cognitive deficits and unsteadiness during session. Required min guard A for steadying and pt reaching for rail in hall for steadying. Educated about using RW for mobility at home to increase safety. Per OT, spoke with family and they will be able to provide 24/7 assist at d/c. Do not feel pt will be able to care for her sister with dementia given her own current deficits. Will continue to follow acutely to maximize functional mobility independence and safety.  Leighton Ruff, PT, DPT  Acute Rehabilitation Services  Pager: [phone] Office: [phone]

## 2018-06-26 NOTE — Progress Notes (Addendum)
NEUROLOGY PROGRESS NOTE  Subjective: Patient states that she is at the sleep in a long time.  She does feel better.  43 the conversation was about how she just cannot take care of her sister anymore.  She is open to getting her sister into a memory facility.  She herself is concerned about her own health if she continues to take care of her sister.  Exam: Vitals:   06/26/18 0433 06/26/18 0727  BP: (!) 173/73 (!) 172/69  Pulse: 60 61  Resp: 15 20  Temp: 98.6 F (37 C) 98.6 F (37 C)  SpO2: 96% 97%    Physical Exam   HEENT-  Normocephalic, no lesions, without obvious abnormality.  Normal external eye and conjunctiva.   Extremities- Warm, dry and intact Musculoskeletal-no joint tenderness, deformity or swelling Skin-warm and dry, no hyperpigmentation, vitiligo, or suspicious lesions    Neuro:  Mental Status: Alert, oriented, thought content appropriate.  Speech fluent without evidence of aphasia.  Able to follow 3 step commands without difficulty. Cranial Nerves: II:  Visual fields grossly normal,  III,IV, VI: ptosis not present, extra-ocular motions intact bilaterally pupils equal, round, reactive to light and accommodation V,VII: smile symmetric, facial light touch sensation normal bilaterally VIII: hearing normal bilaterally IX,X: Palate rises midline XI: bilateral shoulder shrug XII: midline tongue extension Motor: Right : Upper extremity   5/5    Left:     Upper extremity   5/5  Lower extremity   5/5     Lower extremity   5/5 Tone and bulk:normal tone throughout; no atrophy noted Sensory: Pinprick and light touch intact throughout, bilaterally Deep Tendon Reflexes: 2+ and symmetric throughout      Medications:  Scheduled: . apixaban  5 mg Oral BID  . atorvastatin  40 mg Oral Daily  . furosemide  40 mg Oral Daily  . insulin aspart  0-15 Units Subcutaneous TID WC  . loratadine  10 mg Oral Daily  . multivitamin  1 tablet Oral Daily  . potassium chloride SA   20 mEq Oral Daily   Continuous: . sodium chloride      Pertinent Labs/Diagnostics: MRI planned at this time.  Echocardiogram has been performed awaiting reading  Ct Head Wo Contrast  Result Date: 06/25/2018 CLINICAL DATA:  Imbalance, forgetfulness. Expressive aphasia. EXAM: CT HEAD WITHOUT CONTRAST TECHNIQUE: Contiguous axial images were obtained from the base of the skull through the vertex without intravenous contrast. COMPARISON:  12/06/2012. FINDINGS: Brain: No evidence for acute infarction, hemorrhage, mass lesion, or extra-axial fluid. Generalized atrophy, not unexpected for age. Large cortical infarcts affect the LEFT frontal and posterior frontal regions of the brain, with encephalomalacia and regional gliosis. Superimposed is extensive chronic microvascular ischemic change throughout the white matter. Hydrocephalus ex vacuo. Remote LEFT thalamic lacunar infarct could have been acute in 2014. There may be a chronic LEFT brainstem infarct at the lower midbrain/upper pontine level. Vascular: Calcification of the cavernous internal carotid arteries consistent with cerebrovascular atherosclerotic disease. No signs of intracranial large vessel occlusion. Skull: Calvarium intact. Sinuses/Orbits: No acute findings. Other: None. IMPRESSION: Chronic changes as described. Remote LEFT hemisphere infarcts similar to priors. No acute intracranial findings. Electronically Signed   By: Staci Righter M.D.   On: 06/25/2018 15:19   Assessment: As noted previously 83 year old female with progressive decline in memory/short-term memory over the last 2 weeks as well as some question of gait problems as well.  Likely it appears that the MRI will be able to be done with  her ICD.  As noted previously wonder if this simply could be exhaustion/stress in the setting of possible some mild underlying neurocognitive issues.  Recommendations: - MRI if possible -PT OT - Assist patient in finding a memory care facility  for her sister -If MRI is negative no further recommendations   Etta Quill PA-C [facility] [phone]  06/26/2018, 9:51 AM  I have seen the patient reviewed the above note.  She seems better to me today, though still with some slightly tangential thoughts.  I suspect that she may have some underlying neurocognitive issues, and with borderline B12 I would start repletion.  MRI does not show any acute findings.  I feel that this is most consistent with a mild delirium in the setting of stress and sleep deprivation.  1) B12 shot 2) I would continue parenteral B12 weekly repletion pending methylmalonic acid 3) she likely needs outpatient psychotherapy to help with the stress associated with caring for her sister. 4) outpatient neuropsychology testing 5) no further inpatient recommendations at this time, please call if further questions or concerns.  Roland Rack, MD [facility] [phone]  If 7pm- 7am, please page neurology on call as listed in [place].

## 2018-06-26 NOTE — TOC Transition Note (Signed)
Transition of Care [facility]) - CM/SW Discharge Note   Patient Details  Name: Alicia Guerrero MRN: [id number] Date of Birth: [birth year]  Transition of Care [facility]) CM/SW Contact:  Pollie Friar, RN Phone Number: 06/26/2018, 12:50 PM   Clinical Narrative:    Pt discharging home with [facility].  Pt has transportation home.  TOC provided her information on assisting her to ger her sister into ALF. TOC also informed the ALF coordinator of the patients needs and number and she will reach out to the patient (pt asked to have the ALF rep call her).   Final next level of care: Home w Home Health Services Barriers to Discharge: No Barriers Identified   Patient Goals and CMS Choice   CMS Medicare.gov Compare Post Acute Care list provided to:: Patient Choice offered to / list presented to : Patient  Discharge Placement                       Discharge Plan and Services   Discharge Planning Services: CM Consult Post Acute Care Choice: Home Health                    HH Arranged: RN, PT, Social Work [employer] Agency: [place] Date [facility] Agency Contacted: 06/26/18 Time [place]: 2458 Representative spoke with at [place]: [place] Determinants of Health ([place]) Interventions     Readmission Risk Interventions No flowsheet data found.

## 2018-06-26 NOTE — Evaluation (Signed)
Speech Language Pathology Evaluation Patient Details Name: Alicia Guerrero MRN: [id number] DOB: Jul 12, 1935 Today's Date: 06/26/2018 Time: 7371-0626 SLP Time Calculation (min) (ACUTE ONLY): 35 min  Problem List:  Patient Active Problem List   Diagnosis Date Noted  . Balance problem 06/25/2018  . History of cardioembolic stroke 94/85/4627  . Exhaustion delirium 06/25/2018  . Balance disorder 06/25/2018  . Acute bronchitis 07/02/2017  . Diabetes type 2, controlled ([place]) 05/14/2015  . Intertriginous candidiasis 05/14/2015  . Eczema 05/14/2015  . Well adult exam 05/14/2014  . Essential hypertension 01/28/2014  . Hip pain 12/18/2013  . Chronic renal failure 05/17/2013  . Abnormal CT scan, chest 05/17/2013  . Dyspnea 05/16/2013  . Cough 05/06/2013  . Cerebral infarction ([place]) 12/05/2012  . Breast cancer ([place]) 08/01/2012  .  Diaphragmatic stimulation 02/15/2012  . Chronic systolic heart failure ([place]) 12/20/2011  . Long term (current) use of anticoagulants 11/10/2011  . Cardiomyopathy, ischemic 11/02/2011  . Biventricular defibrillator-St. Jude 11/02/2011  . Sinus bradycardia 10/01/2011  . Ventricular fibrillation ([place]) 09/26/2011  . Left bundle branch block 05/25/2010  . CAROTID ARTERY STENOSIS, WITHOUT INFARCTION 09/15/2009  . ADENOCARCINOMA, BREAST, HX OF 09/26/2008  . Atrial fibrillation ([place]) 07/09/2008  . HYPERLIPIDEMIA, MIXED 01/01/2007  . Essential hypertension, benign 01/01/2007  . Coronary atherosclerosis 01/01/2007  . Mild intermittent asthma in adult without complication 03/50/0938  . HYPERTHYROIDISM, HX OF 01/01/2007  . PERCUTANEOUS TRANSLUMINAL CORONARY ANGIOPLASTY, HX OF 01/01/2007   Past Medical History:  Past Medical History:  Diagnosis Date  . Allergic rhinitis due to pollen   . Asthma   . Atrial fibrillation ([place])   . Biventricular ICD (implantable cardiac defibrillator) in place    St Jude 10/2011 (implant MD: Allred)  . Bradycardia    a. coreg tapered  off 05/2011 => b. Metoprolol restarted after cardiac arrest 09/2011  . CAD (coronary artery disease)    a. s/p stent to RCA 1996;  b. s/p cutting balloon PTCA to LAD 2001; c. s/p Cypher DES to LAD 2003;  d. NSTEMI 4/12: DES to dRCA;  e.  [place] 9/13 (after presentation with cardiac arrest and + Nz's for NSTEMI): pLAD 30% ISR, oD1 30%, dLAD 60-70%, pOM1 (small vessel) 90%, oOM2 95% (moderate to large vessel), mCFX 30%, pOM3 30-40%, dRCA stents and pPDA stents patent with 30% ISR => med Rx rec.   . Cancer ([place])   . Cardiac arrest ([place]) 09/2011   a. VT/VF in community; resusc unsuccessful;  PEA in ED; multiple defibs => revived;  [employer];  c/b by VDRF, cardiogenic shock;  LHC with stable anatomy => med Rx;  difficult ween from vent => s/p trach;  d/c to SNF  . Chronic eczema    hands  . Chronic systolic heart failure ([place])   . Hemorrhoids   . HLD (hyperlipidemia)   . HTN (hypertension)   . Ischemic cardiomyopathy    a. EF 40% at cath 4/12 with AL and Inf HK;  b.  Echo after presentation with cardiac arrest 9/13:  EF 40-45% and severe inf HK to AK c/w infarction, PASP 42  . Left bundle branch block   . Other and unspecified ovarian cyst   . Other atopic dermatitis and related conditions   . Personal history of hyperthyroidism   . Personal history of malignant neoplasm of breast    Past Surgical History:  Past Surgical History:  Procedure Laterality Date  . ABDOMINAL HYSTERECTOMY    . BI-VENTRICULAR IMPLANTABLE CARDIOVERTER DEFIBRILLATOR N/A 11/02/2011  Procedure: BI-VENTRICULAR IMPLANTABLE CARDIOVERTER DEFIBRILLATOR  (CRT-D);  Surgeon: Thompson Grayer, MD;  Location: [facility] CATH LAB;  Service: Cardiovascular;  Laterality: N/A;  . BIV ICD GENERATOR CHANGEOUT N/A 05/11/2017   Procedure: BIV ICD GENERATOR CHANGEOUT;  Surgeon: [name], MD;  Location: [facility] CV LAB;  Service: Cardiovascular;  Laterality: N/A;  . BREAST LUMPECTOMY  08/24/2003   right lumpectomy+sln,T2N0,ERPR+,Her2-  .  CHOLECYSTECTOMY    . COLONOSCOPY    . CORONARY STENT PLACEMENT  may and  july 2012  . LEFT HEART CATHETERIZATION WITH CORONARY ANGIOGRAM N/A 10/09/2011   Procedure: LEFT HEART CATHETERIZATION WITH CORONARY ANGIOGRAM;  Surgeon: Larey Dresser, MD;  [facility] CATH LAB;  Service: Cardiovascular;  Laterality: N/A;  . OOPHORECTOMY    . POLYPECTOMY    . PTCA    . VIDEO BRONCHOSCOPY  10/26/2011   Procedure: VIDEO BRONCHOSCOPY WITHOUT FLUORO;  Surgeon: Raylene Miyamoto, MD;  Location: [facility] ENDOSCOPY;  Service: Endoscopy;  Laterality: Bilateral;   HPI:  Pt is an 83 y.o. female with medical history significant of atrial fibrillation with biventricular implantable cardiac defibrillator, ischemic cardiomyopathy, hypertension, hyperlipidemia, chronic systolic heart failure, and a cardiac arrest in September 2013, malignant neoplasm of the breast who came into the emergency department due to having memory issues that appear to of started approximately 2 weeks ago. CT of the head was negative for acute changes, MRI has not been completed as yet.    Assessment / Plan / Recommendation Clinical Impression  Pt reported that she currently resides with his husband and was managing the finances prior to admission. She also stated that she previously managed her medications but recently stopped for a reason which she could not explain. She denied any baseline deficits in speech, language or cognition but stated that she has been having some "confusion" and memory difficulty for the past two weeks which has not resolved. The [facility] Cognitive Assessment 8.1 was completed to evaluate the pt's cognitive-linguistic skills. She achieved a score of 12/30 which is below the normal limits of 26 or more out of 30 and is suggestive of a moderate impairment. She demonstrated deficits in the areas of executive function, attention, mental manipulation, divergent naming, abstract reasoning, and recall. Skilled SLP services are  clinically indicated at this time to improve cognition. Pt, and nursing were educated regarding results and recommendations; both parties verbalized understanding as well as agreement with plan of care.    SLP Assessment  SLP Recommendation/Assessment: Patient needs continued Speech Lanaguage Pathology Services SLP Visit Diagnosis: Cognitive communication deficit (R41.841)    Follow Up Recommendations  24 hour supervision/assistance(Continued SLP services at level of care recommended by PT/OT)    Frequency and Duration min 2x/week  2 weeks      SLP Evaluation Cognition  Overall Cognitive Status: Impaired/Different from baseline Arousal/Alertness: Awake/alert Attention: Focused;Sustained Focused Attention: Impaired Focused Attention Impairment: Verbal complex(Vigilance impaired: 0/1) Sustained Attention: Impaired Sustained Attention Impairment: Verbal complex(Serial 7s: 0/3) Memory: Impaired Memory Impairment: Retrieval deficit;Decreased recall of new information(Immediate: 3/5; delayed: 0/5; with cues: 5/5) Awareness: Impaired Awareness Impairment: Emergent impairment Problem Solving: Impaired Problem Solving Impairment: Verbal complex(0/3) Executive Function: Reasoning;Sequencing;Organizing Reasoning: Impaired Reasoning Impairment: Verbal complex(Abstraction: 1/2) Sequencing: Impaired Sequencing Impairment: Verbal complex(Clock drawing: 1/3) Organizing: Impaired Organizing Impairment: Verbal complex(Backward digit span: 0/1)       Comprehension  Auditory Comprehension Overall Auditory Comprehension: Appears within functional limits for tasks assessed Yes/No Questions: Within Functional Limits Commands: Impaired Complex Commands: (Trail completion: 0/1) Conversation: Simple Interfering Components:  Attention Reading Comprehension Reading Status: Not tested    Expression Expression Primary Mode of Expression: Verbal Verbal Expression Overall Verbal Expression: Appears  within functional limits for tasks assessed Initiation: No impairment Level of Generative/Spontaneous Verbalization: Conversation Repetition: Impaired Level of Impairment: Sentence level(0/2) Naming: No impairment Confrontation: Within functional limits(3/3) Divergent: (0/1) Pragmatics: No impairment Written Expression Dominant Hand: Left Written Expression: (Difficulty copying cube: 0/1)   Oral / Motor  Oral Motor/Sensory Function Overall Oral Motor/Sensory Function: Within functional limits Motor Speech Overall Motor Speech: Appears within functional limits for tasks assessed Respiration: Within functional limits Phonation: Normal Resonance: Within functional limits Articulation: Within functional limitis Intelligibility: Intelligible Motor Planning: Witnin functional limits Motor Speech Errors: Not applicable   Dereke Neumann I. Hardin Negus, [place], [place] Office number [phone] Pager [place] 06/26/2018, 11:14 AM

## 2018-06-26 NOTE — Progress Notes (Signed)
Spoke with son [name] for information regarding MRI compatibility. Given phone # for [place] [phone].   Barnie Mort. All 3 leads and device are MRI conditional for MRI 1.5 [place] scan.   Contact for preparation MRI safe mode: Karilyn Cota, Representative paged.

## 2018-06-26 NOTE — Progress Notes (Signed)
CSW received information from RN and RNCM that patient would like assistance with finding a memory care placement for her sister. CSW sent referral to Assisted Living Loc[facility] to assist the patient.  Laveda Abbe, [place] Clinical Social Worker [phone]

## 2018-06-26 NOTE — TOC Initial Note (Signed)
Transition of Care [facility]) - Initial/Assessment Note    Patient Details  Name: Brianda Beitler MRN: [id number] Date of Birth: 08-17-1935  Transition [facility]) CM/SW Contact:    Pollie Friar, RN Phone Number: 06/26/2018, 12:49 PM  Clinical Narrative:                   Expected Discharge Plan: [place] Barriers to Discharge: No Barriers Identified   Patient Goals and CMS Choice   CMS Medicare.gov Compare Post Acute Care list provided to:: Patient Choice offered to / list presented to : Patient  Expected Discharge Plan and Services Expected Discharge Plan: [place]   Discharge Planning Services: CM Consult Post Acute Care Choice: [place] arrangements for the past 2 months: Single Family Home Expected Discharge Date: 06/26/18                                    Prior Living Arrangements/Services Living arrangements for the past 2 months: Single Family Home Lives with:: Spouse Patient language and need for interpreter reviewed:: Yes(no needs) Do you feel safe going back to the place where you live?: Yes      Need for Family Participation in Patient Care: Yes (Comment) Care giver support system in place?: Yes (comment)(spouse)   Criminal Activity/Legal Involvement Pertinent to Current Situation/Hospitalization: No - Comment as needed  Activities of Daily Living      Permission Sought/Granted                  Emotional Assessment Appearance:: Appears stated age Attitude/Demeanor/Rapport: Engaged Affect (typically observed): Accepting, Appropriate Orientation: : Oriented to Self, Oriented to Place   Psych Involvement: No (comment)  Admission diagnosis:  Ataxia [R27.0] Confusion [R41.0] Patient Active Problem List   Diagnosis Date Noted  . Balance problem 06/25/2018  . History of cardioembolic stroke 07/37/1062  . Exhaustion delirium 06/25/2018  . Balance disorder 06/25/2018  . Acute bronchitis  07/02/2017  . Diabetes type 2, controlled ([place]) 05/14/2015  . Intertriginous candidiasis 05/14/2015  . Eczema 05/14/2015  . Well adult exam 05/14/2014  . Essential hypertension 01/28/2014  . Hip pain 12/18/2013  . Chronic renal failure 05/17/2013  . Abnormal CT scan, chest 05/17/2013  . Dyspnea 05/16/2013  . Cough 05/06/2013  . Cerebral infarction ([place]) 12/05/2012  . Breast cancer ([place]) 08/01/2012  .  Diaphragmatic stimulation 02/15/2012  . Chronic systolic heart failure ([place]) 12/20/2011  . Long term (current) use of anticoagulants 11/10/2011  . Cardiomyopathy, ischemic 11/02/2011  . Biventricular defibrillator-St. Jude 11/02/2011  . Sinus bradycardia 10/01/2011  . Ventricular fibrillation ([place]) 09/26/2011  . Left bundle branch block 05/25/2010  . CAROTID ARTERY STENOSIS, WITHOUT INFARCTION 09/15/2009  . ADENOCARCINOMA, BREAST, HX OF 09/26/2008  . Atrial fibrillation ([place]) 07/09/2008  . HYPERLIPIDEMIA, MIXED 01/01/2007  . Essential hypertension, benign 01/01/2007  . Coronary atherosclerosis 01/01/2007  . Mild intermittent asthma in adult without complication 69/48/5462  . HYPERTHYROIDISM, HX OF 01/01/2007  . PERCUTANEOUS TRANSLUMINAL CORONARY ANGIOPLASTY, HX OF 01/01/2007   PCP:  [name], MD Pharmacy:   [facility] [address], [place] - [address] [place] [address] Phone: [phone] Fax: [phone]  [facility] [place] to Registered [place] [address] Phone: [phone] Fax: [phone]  Social Determinants of Health (SDOH) Interventions    Readmission Risk Interventions No flowsheet data found.

## 2018-06-26 NOTE — Progress Notes (Signed)
Carotid duplex       has been completed. Preliminary results can be found under CV proc through chart review. Jolan Upchurch, BS, RDMS, RVT   

## 2018-06-26 NOTE — Progress Notes (Signed)
  Speech Language Pathology Treatment: Cognitive-Linquistic  Patient Details Name: Alicia Guerrero MRN: [id number] DOB: 03/06/1935 Today's Date: 06/26/2018 Time: 4196-2229 SLP Time Calculation (min) (ACUTE ONLY): 18 min  Assessment / Plan / Recommendation Clinical Impression  Pt was seen for cognitive-linguistic treatment and was cooperative throughout the session without complaint of pain. She required min-mod cues to solve problems related to safety and mod-max support for time management problems. She demonstrated 20% accuracy with immediate 5-item recall and 40% accuracy with immediate 4-item recall both increasing to 100% accuracy with mod cues. She completed abstract reasoning tasks with 60% accuracy increasing to 100% with min.-mod cues. She required mod cues to attend to headings and select foods based on meals during attempts to order breakfast. Pt became frustrated when she continued to request roast beef or salmon (i.e., lunch options) for breakfast and was reminded that those were not breakfast options. She stated, "You can't even get what you want here" and mod-max cues were required for patient to sequence the subsequent steps neceassary to place the order. Pt ultimately requested that the SLP complete the task and order her meal for her or she "just won't eat anything today". Pt's room service option was modified to "yes with assistance" and SLP will continue to follow pt.    HPI HPI: Pt is an 83 y.o. female with medical history significant of atrial fibrillation with biventricular implantable cardiac defibrillator, ischemic cardiomyopathy, hypertension, hyperlipidemia, chronic systolic heart failure, and a cardiac arrest in September 2013, malignant neoplasm of the breast who came into the emergency department due to having memory issues that appear to of started approximately 2 weeks ago. CT of the head was negative for acute changes, MRI has not been completed as yet.       SLP Plan  Continue with current plan of care  Patient needs continued Speech Lanaguage Pathology Services    Recommendations                   Follow up Recommendations: 24 hour supervision/assistance(Continued SLP services at level of care recommended by PT/OT) SLP Visit Diagnosis: Cognitive communication deficit (N98.921) Plan: Continue with current plan of care       Mekiah Cambridge I. Hardin Negus, [place], [place] Office number [phone] Pager [name] 06/26/2018, 11:32 AM

## 2018-06-26 NOTE — Plan of Care (Signed)
Patient stable, discussed POC with patient, spouse, and son Jeral Fruit. Patient and family agreeable with plan, denies question/concerns at this time.

## 2018-06-26 NOTE — Progress Notes (Signed)
  Echocardiogram 2D Echocardiogram has been performed.  Jennette Dubin 06/26/2018, 9:45 AM

## 2018-06-26 NOTE — Evaluation (Signed)
Occupational Therapy Evaluation Patient Details Name: Alicia Guerrero MRN: [id number] DOB: [birth year] Today's Date: 06/26/2018    History of Present Illness Pt is an 83 year old female awaiting MRI results, CT head showed no acute changes with progressive decline in memory/short-term memory over the last 2 weeks as well as some question of gait problems as well.  Likely it appears that the MRI will be able to be done with her ICD.  As noted previously wonder if this simply could be exhaustion/stress in the setting of possible some mild underlying neurocognitive issues. Pmhx: previous CVA< afib, cardiac defib, ischemic cardiomyopathy, HTN. HLD, neoplasm of breast.   Clinical Impression   Pt PTA: living with spouse. This OTR spoke with family (son) on phone after evaluation. PTA information from pt was correct and confirmed by son. Pt was caring for her sister with dementia over the past 12 weeks leading to loss of appetite and extreme exhaustion leading to this hospitalization.  pt's son reports the pt was independent with ADL and mobility; pt was driving. Pt's spouse is available 24/7 and son lives next door.    Pt limited by decreased awareness of safety and awareness of deficits. Pt performing ADL functional mobility with RW and minguardA to supervisionA. Pt stood for grooming and toilet hygiene with supervisionA/set-upA with minimal cues for sequencing. Pt contradicting herself during session, but overall pt able to follow commands. Pt would benefit from continued OT skilled services for safety, ADL and mobility training. OT to follow acutely.      Follow Up Recommendations  Home health OT;Supervision/Assistance - 24 hour    Equipment Recommendations  Other (comment)(2 wheeled RW)    Recommendations for Other Services       Precautions / Restrictions Precautions Precautions: Fall Restrictions Weight Bearing Restrictions: No      Mobility Bed Mobility Overal bed mobility: Needs  Assistance Bed Mobility: Sidelying to Sit   Sidelying to sit: Supervision       General bed mobility comments: no physical assist required  Transfers Overall transfer level: Needs assistance Equipment used: Rolling walker (2 wheeled) Transfers: Sit to/from [facility] Sit to Stand: Min guard Stand pivot transfers: Supervision       General transfer comment: SupervisionA to minguardA for safety and cues for hand placement    Balance Overall balance assessment: Mild deficits observed, not formally tested                                         ADL either performed or assessed with clinical judgement   ADL Overall ADL's : At baseline                                       General ADL Comments: Pt using figure four technique for LB ADL and cues for safety with grab bars in BA. Pt requires reminders for using RW correctly. Pt standing for ADL tasks at sink, pt stood for toilet hygiene. Pt requires supervision and cues for safety.     Vision Baseline Vision/History: Wears glasses Vision Assessment?: No apparent visual deficits     Perception     Praxis      Pertinent Vitals/Pain Pain Assessment: No/denies pain     Hand Dominance Left   Extremity/Trunk Assessment Upper Extremity Assessment Upper Extremity Assessment: Generalized  weakness   Lower Extremity Assessment Lower Extremity Assessment: Defer to PT evaluation;Generalized weakness   Cervical / Trunk Assessment Cervical / Trunk Assessment: Normal   Communication Communication Communication: No difficulties   Cognition Arousal/Alertness: Awake/alert Behavior During Therapy: WFL for tasks assessed/performed Overall Cognitive Status: Impaired/Different from baseline Area of Impairment: Attention;Orientation;Memory;Following commands;Safety/judgement;Awareness                 Orientation Level: Disoriented to;Situation Current Attention Level:  Sustained Memory: Decreased short-term memory Following Commands: Follows one step commands with increased time Safety/Judgement: Decreased awareness of deficits;Decreased awareness of safety     General Comments: Pt following commands, but will contradict herself by saying "I have a 1 story home" and then say "In my 2 story home.." Pt's son confirmed via phone that pt had cognitive decline over last weeks.   General Comments  This OTR spoke with family (son) on phone after evaluation. PTA information from pt was correct and confirmed by son. Pt was caring for her sister with dementia over the past 12 weeks leading to loss of appetite and extreme exhaustion leading to this hospitalization.  pt's son reports the pt was independent with ADL and mobility; pt was driving. Pt's spouse is available 24/7 and son lives next door.      Exercises     Shoulder Instructions      Home Living Family/patient expects to be discharged to:: Private residence Living Arrangements: Spouse/significant other Available Help at Discharge: Family;Available 24 hours/day Type of Home: House Home Access: Stairs to enter [employer] of Steps: 3 Entrance Stairs-Rails: None(reports a column on R side) Home Layout: One level     Bathroom Shower/Tub: Occupational psychologist: Standard     Home Equipment: Cane - single point;Walker - 4 wheels      Lives With: Spouse    Prior Functioning/Environment Level of Independence: Independent with assistive device(s)                 OT Problem List: Decreased strength;Decreased activity tolerance;Impaired balance (sitting and/or standing);Decreased safety awareness;Pain      OT Treatment/Interventions: Self-care/ADL training;Therapeutic exercise;Neuromuscular education;Energy conservation;Therapeutic activities;Patient/family education;Balance training    OT Goals(Current goals can be found in the care plan section) Acute Rehab OT  Goals Patient Stated Goal: to go home OT Goal Formulation: With patient Time For Goal Achievement: 07/10/18 Potential to Achieve Goals: Good ADL Goals Pt Will Perform Grooming: with modified independence;standing Pt Will Perform Upper Body Dressing: with modified independence;sitting;standing Pt Will Perform Lower Body Dressing: with modified independence;sit to/from stand Pt Will Perform Toileting - Clothing Manipulation and hygiene: with modified independence;sit to/from stand Additional ADL Goal #1: Pt will perform OOB ADL tasks with no verbal cues for safety.  OT Frequency: Min 2X/week   Barriers to D/C:            Co-evaluation              AM-PAC OT "6 Clicks" Daily Activity     Outcome Measure Help from another person eating meals?: None Help from another person taking care of personal grooming?: None Help from another person toileting, which includes using toliet, bedpan, or urinal?: None Help from another person bathing (including washing, rinsing, drying)?: A Little Help from another person to put on and taking off regular upper body clothing?: None Help from another person to put on and taking off regular lower body clothing?: A Little 6 Click Score: 22   End of Session Equipment  Utilized During Treatment: Gait belt;Rolling walker Nurse Communication: Mobility status  Activity Tolerance: Patient tolerated treatment well Patient left: in chair;with call bell/phone within reach;with chair alarm set  OT Visit Diagnosis: Unsteadiness on feet (R26.81);Muscle weakness (generalized) (M62.81)                Time: 1100-1130 OT Time Calculation (min): 30 min Charges:  OT General Charges $OT Visit: 1 Visit OT Evaluation $OT Eval Moderate Complexity: 1 Mod OT Treatments $Self Care/Home Management : 8-22 mins  Ebony Hail Harold Hedge) Marsa Aris OTR/L Acute Rehabilitation Services Pager: [phone] Office: [place] 06/26/2018, 1:41 PM

## 2018-06-27 ENCOUNTER — Telehealth: Payer: Self-pay | Admitting: *Deleted

## 2018-06-27 NOTE — Telephone Encounter (Signed)
Transition Care Management Follow-up Telephone Call   Date discharged? 06/26/18   How have you been since you were released from the hospital? Spoke w/pt son Karma Greaser) he states mom is doing ok. Not w/her at the moment   Do you understand why you were in the hospital? YES   Do you understand the discharge instructions? YES   Where were you discharged to? Home   Items Reviewed:  Medications reviewed: YES, he states no changes w/meds  Allergies reviewed: YES  Dietary changes reviewed: YES, heart healthy & carb modified  Referrals reviewed: No referral recommeded   Functional Questionnaire:   Activities of Daily Living (ADLs):   He states she are independent in the following: bathing and hygiene, feeding, continence, grooming, toileting and dressing States she require assistance with the following: ambulation   Any transportation issues/concerns?: NO   Any patient concerns? NO   Confirmed importance and date/time of follow-up visits scheduled YES, appt 07/04/18- No COVID sxs also inform must wear face mask  Provider Appointment booked with Dr. Alain Marion  Confirmed with patient if condition begins to worsen call PCP or go to the ER.  Patient was given the office number and encouraged to call back with question or concerns.  : YES

## 2018-06-28 LAB — VITAMIN B1: Vitamin B1 (Thiamine): 82.7 nmol/L (ref 66.5–200.0)

## 2018-07-03 DIAGNOSIS — I5022 Chronic systolic (congestive) heart failure: Secondary | ICD-10-CM | POA: Diagnosis not present

## 2018-07-03 DIAGNOSIS — I2510 Atherosclerotic heart disease of native coronary artery without angina pectoris: Secondary | ICD-10-CM | POA: Diagnosis not present

## 2018-07-03 DIAGNOSIS — R69 Illness, unspecified: Secondary | ICD-10-CM | POA: Diagnosis not present

## 2018-07-03 DIAGNOSIS — E782 Mixed hyperlipidemia: Secondary | ICD-10-CM | POA: Diagnosis not present

## 2018-07-03 DIAGNOSIS — E119 Type 2 diabetes mellitus without complications: Secondary | ICD-10-CM | POA: Diagnosis not present

## 2018-07-03 DIAGNOSIS — I4891 Unspecified atrial fibrillation: Secondary | ICD-10-CM | POA: Diagnosis not present

## 2018-07-03 DIAGNOSIS — J45909 Unspecified asthma, uncomplicated: Secondary | ICD-10-CM | POA: Diagnosis not present

## 2018-07-03 DIAGNOSIS — G3184 Mild cognitive impairment, so stated: Secondary | ICD-10-CM | POA: Diagnosis not present

## 2018-07-03 DIAGNOSIS — I110 Hypertensive heart disease with heart failure: Secondary | ICD-10-CM | POA: Diagnosis not present

## 2018-07-03 DIAGNOSIS — I447 Left bundle-branch block, unspecified: Secondary | ICD-10-CM | POA: Diagnosis not present

## 2018-07-04 ENCOUNTER — Other Ambulatory Visit: Payer: Self-pay

## 2018-07-04 ENCOUNTER — Ambulatory Visit (INDEPENDENT_AMBULATORY_CARE_PROVIDER_SITE_OTHER): Payer: Medicare HMO | Admitting: Internal Medicine

## 2018-07-04 ENCOUNTER — Encounter: Payer: Self-pay | Admitting: Internal Medicine

## 2018-07-04 VITALS — BP 136/88 | HR 64 | Temp 97.9°F | Ht 63.0 in | Wt 155.0 lb

## 2018-07-04 DIAGNOSIS — R2689 Other abnormalities of gait and mobility: Secondary | ICD-10-CM

## 2018-07-04 DIAGNOSIS — E119 Type 2 diabetes mellitus without complications: Secondary | ICD-10-CM

## 2018-07-04 DIAGNOSIS — E538 Deficiency of other specified B group vitamins: Secondary | ICD-10-CM | POA: Diagnosis not present

## 2018-07-04 DIAGNOSIS — N183 Chronic kidney disease, stage 3 unspecified: Secondary | ICD-10-CM

## 2018-07-04 DIAGNOSIS — F430 Acute stress reaction: Secondary | ICD-10-CM | POA: Diagnosis not present

## 2018-07-04 DIAGNOSIS — R69 Illness, unspecified: Secondary | ICD-10-CM | POA: Diagnosis not present

## 2018-07-04 DIAGNOSIS — F0150 Vascular dementia without behavioral disturbance: Secondary | ICD-10-CM | POA: Insufficient documentation

## 2018-07-04 DIAGNOSIS — E559 Vitamin D deficiency, unspecified: Secondary | ICD-10-CM

## 2018-07-04 DIAGNOSIS — F0151 Vascular dementia with behavioral disturbance: Secondary | ICD-10-CM

## 2018-07-04 DIAGNOSIS — F01518 Vascular dementia, unspecified severity, with other behavioral disturbance: Secondary | ICD-10-CM

## 2018-07-04 MED ORDER — CYANOCOBALAMIN 1000 MCG SL SUBL: 1.0000 | SUBLINGUAL_TABLET | Freq: Every day | SUBLINGUAL | 11 refills | Status: DC

## 2018-07-04 MED ORDER — CYANOCOBALAMIN 1000 MCG/ML IJ SOLN
1000.0000 ug | Freq: Once | INTRAMUSCULAR | Status: AC
  Administered 2018-07-04: 1000 ug via INTRAMUSCULAR

## 2018-07-04 NOTE — Assessment & Plan Note (Signed)
Start B12 inj

## 2018-07-04 NOTE — Progress Notes (Signed)
Subjective:  Patient ID: Alicia Guerrero, female    DOB: 04-19-1935  Age: 83 y.o. MRN: [id number]  CC: No chief complaint on file.   HPI Alicia Guerrero presents for a post-hosp f/u - d/c on 6/10.The patient approximately 12 weeks PTA has started taking care of her sister who is been diagnosed with Alzheimer's disease. Her sister lives in [place]. Patient has only been getting a few hours of sleep due to her sisters anxiety and behavioral issues associated with her dementia. She has not been eating well because her sister has not been eating. The past couple of weeks she has become extremely exhausted and recently pulled over into [employer] because she could not recall where she was when [place] was [place] [address].  [name] was found to be confused. She is forgetful after d/c.  Per d/c:  "Recommendations for Outpatient Follow-up:  1. F/u with PCP within a week  for hospital discharge follow up, repeat cbc/bmp at follow up. pcp to check methylmalonic acid level, consider parental b12 supplement if methylmalonic acid level is low, pcp to refer patient to have neuropsychology testing 2. F/u with neurology as needed for memory issues 3. Home health arranged, PR/RN, social worker for caregiver exhaustion , patient is a main caregiver for her sister who has progressive dementia 4. Continue all home meds, no new meds at discharge  Discharge Diagnoses:      Active Hospital Problems   Diagnosis Date Noted   Balance problem 06/25/2018   History of cardioembolic stroke 58/83/2549   Exhaustion delirium 06/25/2018   Diabetes mellitus type 2, noninsulin dependent ([facility]) 05/14/2015   Cardiomyopathy, ischemic 11/02/2011   Biventricular defibrillator-St. Jude 11/02/2011   HYPERLIPIDEMIA, MIXED 01/01/2007   Essential hypertension, benign 01/01/2007   Coronary atherosclerosis 01/01/2007    Resolved Hospital Problems  No resolved problems to display.    Discharge Condition:  stable  Diet recommendation: heart healthy/carb modified  There were no vitals filed for this visit.  History of present illness: (per admitting MD Dr Alicia Guerrero) IYM:EBRAXENMM, Alicia Lacks, MD Patient coming from:Home  I have personally briefly reviewed patient's old medical records in [place]  Chief Complaint:Stroke symptoms, balance issues and memory problems  HWK:GSUP Alicia Guerrero a 83 y.o.femalewith medical history significant ofatrial fibrillation with biventricular implantable cardiac defibrillator, ischemic cardiomyopathy, hypertension, hyperlipidemia, chronic systolic heart failure, and a cardiac arrest in September 2013, malignant neoplasm of the breast who came into the emergency department due to having memory issues that appear to of started approximately 2 weeks ago. Permission is obtained from the chart and the patient's son. The patient approximately 12 weeks ago has started taking care of her sister who is been diagnosed with Alzheimer's disease. Her sister lives in [place]. Patient has only been getting a few hours of sleep due to her sisters anxiety and behavioral issues associated with her dementia. She has not been eating well because her sister has not been eating. The past couple of weeks she has become extremely exhausted and recently pulled over into [employer] because she could not recall where she was when she was [place] [address]. Son has experience with taking care of ill people and is very concerned that his mother is becoming extraordinarily exhausted. He has had a slow decline over the past 2 to 3 weeks feels her gait has become unsteady and she has to hold onto things. His balance issues are new. All of this has occurred in the past couple  of weeks. Endorses that she has not fallen but feels unsteady when she walks. She states that this is intermittent and has not changed in severity. She is not listing to either side. With regards the  patient's getting lost driving home note notes in the chart indicate that the patient is very forgetful. Has no fevers or chills. No vomiting or diarrhea. ED Course:CT scan showed markedly abnormal brain but no acute changes. EKG shows a paced rhythm with a right bundle branch block, potassium is low at 3.3, glucose elevated at 147  Hospital Course:  Principal Problem:   Balance problem Active Problems:   HYPERLIPIDEMIA, MIXED   Essential hypertension, benign   Coronary atherosclerosis   Cardiomyopathy, ischemic   Biventricular defibrillator-St. Jude   Diabetes mellitus type 2, noninsulin dependent ([facility])   History of cardioembolic stroke   Exhaustion delirium  Exhaustion delirium:  -patient reports she is the main caregiver to her sister who has progressive dementia, -she presents with progressive decline in memory/short-term memory over the past 2 weeks as well as some question of gait problem as well. This is occurred in the setting of lack of rest/sleep/nutrition -MRI no acute infarct, echo lvef 55%-60%, b12 level wnl -she received im b12 injection per neurology recommendation, neurology recommend check methylmalonic acid level and consider weekly injection if MMA level is low -she likely needs outpatient psychotherapy to help with the stress associated with caring for her sister. -outpatient neuropsychology testing per neurology recommendation -she is to follow up with pcp, pcp to refer to neuropsychology testing Home health RN/PT arranged    Afib with ischemic cardiomyopathy , h/o cardiac arrest s/p biv ICD , h/o cardioembolic cva On eliquis, bisoprolol, micardis, lasix, stable at baseline  Hyperlipidemia Continue statin  Hypokalemia: Replace k  noninsulin dependent dm2, controlled a1c 6.4 Continue home meds metformin   Covid 19 screening negative  Procedures:  none  Consultations:  Neurology  Case manager "  Outpatient Medications Prior to  Visit  Medication Sig Dispense Refill   apixaban (ELIQUIS) 5 MG TABS tablet Take 1 tablet (5 mg total) by mouth 2 (two) times daily. 180 tablet 3   atorvastatin (LIPITOR) 40 MG tablet Take 1 tablet (40 mg total) by mouth daily. pls keep scheduled appt for future refills 90 tablet 3   bisoprolol (ZEBETA) 5 MG tablet TAKE 1 TABLET DAILY (Patient taking differently: Take 5 mg by mouth daily. ) 90 tablet 2   Blood Glucose Monitoring Suppl (ONE TOUCH ULTRA 2) w/Device KIT Use to check blood sugars daily Dx e11.9 1 each 0   cetirizine (ZYRTEC) 10 MG tablet Take 1 tablet (10 mg total) by mouth as needed for allergies. (Patient taking differently: Take 10 mg by mouth daily. ) 90 tablet 3   furosemide (LASIX) 40 MG tablet Take 1 tablet (40 mg total) by mouth daily. Must keep scheduled appt for future refills 90 tablet 1   glucose blood (ONE TOUCH ULTRA TEST) test strip 1 each by Other route daily as needed for other. Use to check blood sugars twice a day Dx E11.9 50 each 11   Lancets (ONETOUCH ULTRASOFT) lancets 1 each by Other route 2 (two) times daily. Use to check blood sugars twice a day Dx E11.9 100 each 3   metFORMIN (GLUCOPHAGE) 500 MG tablet Take 1 tablet (500 mg total) by mouth daily with breakfast. Must keep scheduled appt for future refills 90 tablet 3   Multiple Vitamins-Minerals (PRESERVISION AREDS PO) Take 1 capsule by mouth  2 (two) times daily.     nitroGLYCERIN (NITROSTAT) 0.4 MG SL tablet Place 1 tablet (0.4 mg total) under the tongue every 5 (five) minutes x 3 doses as needed for chest pain. Chest pain 20 tablet 3   potassium chloride SA (K-DUR,KLOR-CON) 20 MEQ tablet Take 1 tablet (20 mEq total) by mouth daily. 90 tablet 3   Respiratory Therapy Supplies (NEBULIZER) DEVI Use with solution Q6H PRN wheezing or shortness of breath 1 each 0   telmisartan (MICARDIS) 40 MG tablet Take 1 tablet (40 mg total) by mouth daily. 90 tablet 3   No facility-administered medications prior to  visit.     ROS: Review of Systems  Constitutional: Negative for activity change, appetite change, chills, fatigue and unexpected weight change.  HENT: Negative for congestion, mouth sores and sinus pressure.   Eyes: Negative for visual disturbance.  Respiratory: Negative for cough and chest tightness.   Gastrointestinal: Negative for abdominal pain and nausea.  Genitourinary: Negative for difficulty urinating, frequency and vaginal pain.  Musculoskeletal: Positive for gait problem. Negative for back pain.  Skin: Negative for pallor and rash.  Neurological: Negative for dizziness, tremors, weakness, numbness and headaches.  Psychiatric/Behavioral: Positive for confusion and decreased concentration. Negative for agitation, dysphoric mood, sleep disturbance and suicidal ideas. The patient is not nervous/anxious.     Objective:  BP 136/88 (BP Location: Left Arm, Patient Position: Sitting, Cuff Size: Normal)    Pulse 64    Temp 97.9 F (36.6 C) (Oral)    Ht 5' 3" (1.6 m)    Wt 155 lb (70.3 kg)    SpO2 98%    BMI 27.46 kg/m   BP Readings from Last 3 Encounters:  07/04/18 136/88  06/26/18 (!) 159/76  01/28/18 (!) 177/82    Wt Readings from Last 3 Encounters:  07/04/18 155 lb (70.3 kg)  01/28/18 174 lb (78.9 kg)  09/11/17 172 lb 3.2 oz (78.1 kg)    Physical Exam Constitutional:      General: She is not in acute distress.    Appearance: She is well-developed.  HENT:     Head: Normocephalic.     Right Ear: External ear normal.     Left Ear: External ear normal.     Nose: Nose normal.  Eyes:     General:        Right eye: No discharge.        Left eye: No discharge.     Conjunctiva/sclera: Conjunctivae normal.     Pupils: Pupils are equal, round, and reactive to light.  Neck:     Musculoskeletal: Normal range of motion and neck supple.     Thyroid: No thyromegaly.     Vascular: No JVD.     Trachea: No tracheal deviation.  Cardiovascular:     Rate and Rhythm: Normal rate  and regular rhythm.     Heart sounds: Normal heart sounds.  Pulmonary:     Effort: No respiratory distress.     Breath sounds: No stridor. No wheezing.  Abdominal:     General: Bowel sounds are normal. There is no distension.     Palpations: Abdomen is soft. There is no mass.     Tenderness: There is no abdominal tenderness. There is no guarding or rebound.  Musculoskeletal:        General: No tenderness.  Lymphadenopathy:     Cervical: No cervical adenopathy.  Skin:    Findings: No erythema or rash.  Neurological:  Mental Status: She is disoriented.     Cranial Nerves: No cranial nerve deficit.     Motor: No abnormal muscle tone.     Coordination: Coordination abnormal.     Gait: Gait abnormal.     Deep Tendon Reflexes: Reflexes normal.  Psychiatric:        Behavior: Behavior normal.        Thought Content: Thought content normal.    Alert, oriented x2 Walker   Lab Results  Component Value Date   WBC 6.3 06/25/2018   HGB 12.9 06/25/2018   HCT 41.5 06/25/2018   PLT 269 06/25/2018   GLUCOSE 147 (H) 06/25/2018   CHOL 203 (H) 06/26/2018   TRIG 196 (H) 06/26/2018   HDL 44 06/26/2018   LDLDIRECT 91.1 03/21/2010   LDLCALC 120 (H) 06/26/2018   ALT 16 06/25/2018   AST 19 06/25/2018   NA 142 06/25/2018   K 3.3 (L) 06/25/2018   CL 106 06/25/2018   CREATININE 0.92 06/25/2018   BUN 9 06/25/2018   CO2 23 06/25/2018   TSH 2.054 06/25/2018   INR 3.3 12/09/2012   HGBA1C 6.4 (H) 06/26/2018    Ct Head Wo Contrast  Result Date: 06/25/2018 CLINICAL DATA:  Imbalance, forgetfulness. Expressive aphasia. EXAM: CT HEAD WITHOUT CONTRAST TECHNIQUE: Contiguous axial images were obtained from the base of the skull through the vertex without intravenous contrast. COMPARISON:  12/06/2012. FINDINGS: Brain: No evidence for acute infarction, hemorrhage, mass lesion, or extra-axial fluid. Generalized atrophy, not unexpected for age. Large cortical infarcts affect the LEFT frontal and  posterior frontal regions of the brain, with encephalomalacia and regional gliosis. Superimposed is extensive chronic microvascular ischemic change throughout the white matter. Hydrocephalus ex vacuo. Remote LEFT thalamic lacunar infarct could have been acute in 2014. There may be a chronic LEFT brainstem infarct at the lower midbrain/upper pontine level. Vascular: Calcification of the cavernous internal carotid arteries consistent with cerebrovascular atherosclerotic disease. No signs of intracranial large vessel occlusion. Skull: Calvarium intact. Sinuses/Orbits: No acute findings. Other: None. IMPRESSION: Chronic changes as described. Remote LEFT hemisphere infarcts similar to priors. No acute intracranial findings. Electronically Signed   By: Staci Righter M.D.   On: 06/25/2018 15:19   Mr Brain Wo Contrast  Result Date: 06/26/2018 CLINICAL DATA:  Memory decline over the past 2 weeks. EXAM: MRI HEAD WITHOUT CONTRAST TECHNIQUE: Multiplanar, multiecho pulse sequences of the brain and surrounding structures were obtained without intravenous contrast. COMPARISON:  Head CT 06/25/2018 FINDINGS: Brain: There is no evidence of acute infarct, intracranial hemorrhage, mass, midline shift, or extra-axial fluid collection. Chronic moderate-sized infarcts are noted in the left frontal and left parietal lobes. Chronic lacunar infarcts are noted in the left thalamus and at the midbrain-pons junction just left of midline. Patchy to confluent T2 hyperintensities in the cerebral white matter bilaterally are nonspecific but compatible with moderate to severe chronic small vessel ischemic disease. There is moderate cerebral atrophy. Vascular: Major intracranial vascular flow voids are preserved. Skull and upper cervical spine: Unremarkable bone marrow signal. Sinuses/Orbits: Bilateral cataract extraction. Chronic left maxillary sinusitis. Trace mastoid effusions. Other: None. IMPRESSION: 1. No acute intracranial abnormality. 2.  Moderate to severe chronic small vessel ischemic disease with multiple chronic infarcts as above. Electronically Signed   By: Logan Bores M.D.   On: 06/26/2018 16:12   Vas US Carotid (at [facility] Only)  Result Date: 06/26/2018 Carotid Arterial Duplex Study Indications:       CVA and balance problem. Risk Factors:  Hypertension, hyperlipidemia. Comparison Study:  no prior Performing Technologist: June Leap RDMS, RVT  Examination Guidelines: A complete evaluation includes B-mode imaging, spectral Doppler, color Doppler, and power Doppler as needed of all accessible portions of each vessel. Bilateral testing is considered an integral part of a complete examination. Limited examinations for reoccurring indications may be performed as noted.  Right Carotid Findings: +----------+--------+--------+--------+------------+--------+             PSV cm/s EDV cm/s Stenosis Describe     Comments  +----------+--------+--------+--------+------------+--------+  CCA Prox   51       9                                        +----------+--------+--------+--------+------------+--------+  CCA Distal 62       14                                       +----------+--------+--------+--------+------------+--------+  ICA Prox   65       13       1-39%    heterogenous           +----------+--------+--------+--------+------------+--------+  ICA Distal 62       21                                       +----------+--------+--------+--------+------------+--------+  ECA        66       10                                       +----------+--------+--------+--------+------------+--------+ +----------+--------+-------+----------------+-------------------+             PSV cm/s EDV cms Describe         Arm Pressure (mmHG)  +----------+--------+-------+----------------+-------------------+  Subclavian 68               Multiphasic, WNL                      +----------+--------+-------+----------------+-------------------+  +---------+--------+--+--------+-+---------+  Vertebral PSV cm/s 34 EDV cm/s 8 Antegrade  +---------+--------+--+--------+-+---------+  Left Carotid Findings: +----------+--------+--------+--------+------------+--------+             PSV cm/s EDV cm/s Stenosis Describe     Comments  +----------+--------+--------+--------+------------+--------+  CCA Prox   56       11                                       +----------+--------+--------+--------+------------+--------+  CCA Distal 69       12                                       +----------+--------+--------+--------+------------+--------+  ICA Prox   49       11       1-39%    heterogenous           +----------+--------+--------+--------+------------+--------+  ICA Distal 62       11                                       +----------+--------+--------+--------+------------+--------+  ECA        57       4                                        +----------+--------+--------+--------+------------+--------+ +----------+--------+--------+----------------+-------------------+  Subclavian PSV cm/s EDV cm/s Describe         Arm Pressure (mmHG)  +----------+--------+--------+----------------+-------------------+             98                Multiphasic, WNL                      +----------+--------+--------+----------------+-------------------+ +---------+--------+--+--------+-+---------+  Vertebral PSV cm/s 25 EDV cm/s 5 Antegrade  +---------+--------+--+--------+-+---------+  Summary: Right Carotid: Velocities in the right ICA are consistent with a 1-39% stenosis. Left Carotid: Velocities in the left ICA are consistent with a 1-39% stenosis. Vertebrals:  Bilateral vertebral arteries demonstrate antegrade flow. Subclavians: Normal flow hemodynamics were seen in bilateral subclavian              arteries. *See table(s) above for measurements and observations.  Electronically signed by Antony Contras MD on 06/26/2018 at 12:48:03 PM.    Final     Assessment & Plan:   There are  no diagnoses linked to this encounter.   No orders of the defined types were placed in this encounter.    Follow-up: No follow-ups on file.  Walker Kehr, MD

## 2018-07-04 NOTE — Assessment & Plan Note (Signed)
2020 brain MRI IMPRESSION: 1. No acute intracranial abnormality. 2. Moderate to severe chronic small vessel ischemic disease with multiple chronic infarcts as above.  Treat B12 def

## 2018-07-04 NOTE — Addendum Note (Signed)
Addended by: Karren Cobble on: 07/04/2018 11:15 AM   Modules accepted: Orders

## 2018-07-04 NOTE — Assessment & Plan Note (Signed)
Hydrate well 

## 2018-07-04 NOTE — Assessment & Plan Note (Signed)
Walker

## 2018-07-04 NOTE — Assessment & Plan Note (Signed)
Doing OK 

## 2018-07-04 NOTE — Assessment & Plan Note (Signed)
Better  Eat well

## 2018-07-04 NOTE — Patient Instructions (Signed)
No driving °

## 2018-07-05 DIAGNOSIS — R69 Illness, unspecified: Secondary | ICD-10-CM | POA: Diagnosis not present

## 2018-07-05 DIAGNOSIS — E782 Mixed hyperlipidemia: Secondary | ICD-10-CM | POA: Diagnosis not present

## 2018-07-05 DIAGNOSIS — J45909 Unspecified asthma, uncomplicated: Secondary | ICD-10-CM | POA: Diagnosis not present

## 2018-07-05 DIAGNOSIS — I4891 Unspecified atrial fibrillation: Secondary | ICD-10-CM | POA: Diagnosis not present

## 2018-07-05 DIAGNOSIS — G3184 Mild cognitive impairment, so stated: Secondary | ICD-10-CM | POA: Diagnosis not present

## 2018-07-05 DIAGNOSIS — I2510 Atherosclerotic heart disease of native coronary artery without angina pectoris: Secondary | ICD-10-CM | POA: Diagnosis not present

## 2018-07-05 DIAGNOSIS — I447 Left bundle-branch block, unspecified: Secondary | ICD-10-CM | POA: Diagnosis not present

## 2018-07-05 DIAGNOSIS — I5022 Chronic systolic (congestive) heart failure: Secondary | ICD-10-CM | POA: Diagnosis not present

## 2018-07-05 DIAGNOSIS — E119 Type 2 diabetes mellitus without complications: Secondary | ICD-10-CM | POA: Diagnosis not present

## 2018-07-05 DIAGNOSIS — I110 Hypertensive heart disease with heart failure: Secondary | ICD-10-CM | POA: Diagnosis not present

## 2018-07-09 DIAGNOSIS — I447 Left bundle-branch block, unspecified: Secondary | ICD-10-CM | POA: Diagnosis not present

## 2018-07-09 DIAGNOSIS — I110 Hypertensive heart disease with heart failure: Secondary | ICD-10-CM | POA: Diagnosis not present

## 2018-07-09 DIAGNOSIS — J45909 Unspecified asthma, uncomplicated: Secondary | ICD-10-CM | POA: Diagnosis not present

## 2018-07-09 DIAGNOSIS — E119 Type 2 diabetes mellitus without complications: Secondary | ICD-10-CM | POA: Diagnosis not present

## 2018-07-09 DIAGNOSIS — R69 Illness, unspecified: Secondary | ICD-10-CM | POA: Diagnosis not present

## 2018-07-09 DIAGNOSIS — I5022 Chronic systolic (congestive) heart failure: Secondary | ICD-10-CM | POA: Diagnosis not present

## 2018-07-09 DIAGNOSIS — E782 Mixed hyperlipidemia: Secondary | ICD-10-CM | POA: Diagnosis not present

## 2018-07-09 DIAGNOSIS — G3184 Mild cognitive impairment, so stated: Secondary | ICD-10-CM | POA: Diagnosis not present

## 2018-07-09 DIAGNOSIS — I2510 Atherosclerotic heart disease of native coronary artery without angina pectoris: Secondary | ICD-10-CM | POA: Diagnosis not present

## 2018-07-09 DIAGNOSIS — I4891 Unspecified atrial fibrillation: Secondary | ICD-10-CM | POA: Diagnosis not present

## 2018-07-10 DIAGNOSIS — J45909 Unspecified asthma, uncomplicated: Secondary | ICD-10-CM | POA: Diagnosis not present

## 2018-07-10 DIAGNOSIS — I5022 Chronic systolic (congestive) heart failure: Secondary | ICD-10-CM | POA: Diagnosis not present

## 2018-07-10 DIAGNOSIS — I447 Left bundle-branch block, unspecified: Secondary | ICD-10-CM | POA: Diagnosis not present

## 2018-07-10 DIAGNOSIS — I4891 Unspecified atrial fibrillation: Secondary | ICD-10-CM | POA: Diagnosis not present

## 2018-07-10 DIAGNOSIS — E782 Mixed hyperlipidemia: Secondary | ICD-10-CM | POA: Diagnosis not present

## 2018-07-10 DIAGNOSIS — G3184 Mild cognitive impairment, so stated: Secondary | ICD-10-CM | POA: Diagnosis not present

## 2018-07-10 DIAGNOSIS — E119 Type 2 diabetes mellitus without complications: Secondary | ICD-10-CM | POA: Diagnosis not present

## 2018-07-10 DIAGNOSIS — I2510 Atherosclerotic heart disease of native coronary artery without angina pectoris: Secondary | ICD-10-CM | POA: Diagnosis not present

## 2018-07-10 DIAGNOSIS — I110 Hypertensive heart disease with heart failure: Secondary | ICD-10-CM | POA: Diagnosis not present

## 2018-07-10 DIAGNOSIS — R69 Illness, unspecified: Secondary | ICD-10-CM | POA: Diagnosis not present

## 2018-07-13 DIAGNOSIS — R69 Illness, unspecified: Secondary | ICD-10-CM | POA: Diagnosis not present

## 2018-07-13 DIAGNOSIS — I5022 Chronic systolic (congestive) heart failure: Secondary | ICD-10-CM | POA: Diagnosis not present

## 2018-07-13 DIAGNOSIS — J45909 Unspecified asthma, uncomplicated: Secondary | ICD-10-CM | POA: Diagnosis not present

## 2018-07-13 DIAGNOSIS — I110 Hypertensive heart disease with heart failure: Secondary | ICD-10-CM | POA: Diagnosis not present

## 2018-07-13 DIAGNOSIS — E782 Mixed hyperlipidemia: Secondary | ICD-10-CM | POA: Diagnosis not present

## 2018-07-13 DIAGNOSIS — G3184 Mild cognitive impairment, so stated: Secondary | ICD-10-CM | POA: Diagnosis not present

## 2018-07-13 DIAGNOSIS — I2510 Atherosclerotic heart disease of native coronary artery without angina pectoris: Secondary | ICD-10-CM | POA: Diagnosis not present

## 2018-07-13 DIAGNOSIS — I4891 Unspecified atrial fibrillation: Secondary | ICD-10-CM | POA: Diagnosis not present

## 2018-07-13 DIAGNOSIS — E119 Type 2 diabetes mellitus without complications: Secondary | ICD-10-CM | POA: Diagnosis not present

## 2018-07-13 DIAGNOSIS — I447 Left bundle-branch block, unspecified: Secondary | ICD-10-CM | POA: Diagnosis not present

## 2018-07-15 DIAGNOSIS — J45909 Unspecified asthma, uncomplicated: Secondary | ICD-10-CM | POA: Diagnosis not present

## 2018-07-15 DIAGNOSIS — I4891 Unspecified atrial fibrillation: Secondary | ICD-10-CM | POA: Diagnosis not present

## 2018-07-15 DIAGNOSIS — I2510 Atherosclerotic heart disease of native coronary artery without angina pectoris: Secondary | ICD-10-CM | POA: Diagnosis not present

## 2018-07-15 DIAGNOSIS — R69 Illness, unspecified: Secondary | ICD-10-CM | POA: Diagnosis not present

## 2018-07-15 DIAGNOSIS — I5022 Chronic systolic (congestive) heart failure: Secondary | ICD-10-CM | POA: Diagnosis not present

## 2018-07-15 DIAGNOSIS — E119 Type 2 diabetes mellitus without complications: Secondary | ICD-10-CM | POA: Diagnosis not present

## 2018-07-15 DIAGNOSIS — G3184 Mild cognitive impairment, so stated: Secondary | ICD-10-CM | POA: Diagnosis not present

## 2018-07-15 DIAGNOSIS — I110 Hypertensive heart disease with heart failure: Secondary | ICD-10-CM | POA: Diagnosis not present

## 2018-07-15 DIAGNOSIS — E782 Mixed hyperlipidemia: Secondary | ICD-10-CM | POA: Diagnosis not present

## 2018-07-15 DIAGNOSIS — I447 Left bundle-branch block, unspecified: Secondary | ICD-10-CM | POA: Diagnosis not present

## 2018-07-16 DIAGNOSIS — Z95810 Presence of automatic (implantable) cardiac defibrillator: Secondary | ICD-10-CM

## 2018-07-16 DIAGNOSIS — E119 Type 2 diabetes mellitus without complications: Secondary | ICD-10-CM | POA: Diagnosis not present

## 2018-07-16 DIAGNOSIS — I110 Hypertensive heart disease with heart failure: Secondary | ICD-10-CM | POA: Diagnosis not present

## 2018-07-16 DIAGNOSIS — Z853 Personal history of malignant neoplasm of breast: Secondary | ICD-10-CM

## 2018-07-16 DIAGNOSIS — J45909 Unspecified asthma, uncomplicated: Secondary | ICD-10-CM | POA: Diagnosis not present

## 2018-07-16 DIAGNOSIS — I5022 Chronic systolic (congestive) heart failure: Secondary | ICD-10-CM | POA: Diagnosis not present

## 2018-07-16 DIAGNOSIS — I4510 Unspecified right bundle-branch block: Secondary | ICD-10-CM

## 2018-07-16 DIAGNOSIS — Z9049 Acquired absence of other specified parts of digestive tract: Secondary | ICD-10-CM

## 2018-07-16 DIAGNOSIS — Z95818 Presence of other cardiac implants and grafts: Secondary | ICD-10-CM

## 2018-07-16 DIAGNOSIS — Z90710 Acquired absence of both cervix and uterus: Secondary | ICD-10-CM

## 2018-07-16 DIAGNOSIS — R69 Illness, unspecified: Secondary | ICD-10-CM | POA: Diagnosis not present

## 2018-07-16 DIAGNOSIS — F430 Acute stress reaction: Secondary | ICD-10-CM

## 2018-07-16 DIAGNOSIS — Z7901 Long term (current) use of anticoagulants: Secondary | ICD-10-CM

## 2018-07-16 DIAGNOSIS — E782 Mixed hyperlipidemia: Secondary | ICD-10-CM | POA: Diagnosis not present

## 2018-07-16 DIAGNOSIS — I4891 Unspecified atrial fibrillation: Secondary | ICD-10-CM | POA: Diagnosis not present

## 2018-07-16 DIAGNOSIS — I2510 Atherosclerotic heart disease of native coronary artery without angina pectoris: Secondary | ICD-10-CM | POA: Diagnosis not present

## 2018-07-16 DIAGNOSIS — G3184 Mild cognitive impairment, so stated: Secondary | ICD-10-CM | POA: Diagnosis not present

## 2018-07-16 DIAGNOSIS — I679 Cerebrovascular disease, unspecified: Secondary | ICD-10-CM

## 2018-07-16 DIAGNOSIS — I447 Left bundle-branch block, unspecified: Secondary | ICD-10-CM | POA: Diagnosis not present

## 2018-07-17 DIAGNOSIS — I2510 Atherosclerotic heart disease of native coronary artery without angina pectoris: Secondary | ICD-10-CM | POA: Diagnosis not present

## 2018-07-17 DIAGNOSIS — R69 Illness, unspecified: Secondary | ICD-10-CM | POA: Diagnosis not present

## 2018-07-17 DIAGNOSIS — E119 Type 2 diabetes mellitus without complications: Secondary | ICD-10-CM | POA: Diagnosis not present

## 2018-07-17 DIAGNOSIS — E782 Mixed hyperlipidemia: Secondary | ICD-10-CM | POA: Diagnosis not present

## 2018-07-17 DIAGNOSIS — I4891 Unspecified atrial fibrillation: Secondary | ICD-10-CM | POA: Diagnosis not present

## 2018-07-17 DIAGNOSIS — I110 Hypertensive heart disease with heart failure: Secondary | ICD-10-CM | POA: Diagnosis not present

## 2018-07-17 DIAGNOSIS — J45909 Unspecified asthma, uncomplicated: Secondary | ICD-10-CM | POA: Diagnosis not present

## 2018-07-17 DIAGNOSIS — I5022 Chronic systolic (congestive) heart failure: Secondary | ICD-10-CM | POA: Diagnosis not present

## 2018-07-17 DIAGNOSIS — I447 Left bundle-branch block, unspecified: Secondary | ICD-10-CM | POA: Diagnosis not present

## 2018-07-17 DIAGNOSIS — G3184 Mild cognitive impairment, so stated: Secondary | ICD-10-CM | POA: Diagnosis not present

## 2018-07-22 DIAGNOSIS — I4891 Unspecified atrial fibrillation: Secondary | ICD-10-CM | POA: Diagnosis not present

## 2018-07-22 DIAGNOSIS — E119 Type 2 diabetes mellitus without complications: Secondary | ICD-10-CM | POA: Diagnosis not present

## 2018-07-22 DIAGNOSIS — I447 Left bundle-branch block, unspecified: Secondary | ICD-10-CM | POA: Diagnosis not present

## 2018-07-22 DIAGNOSIS — J45909 Unspecified asthma, uncomplicated: Secondary | ICD-10-CM | POA: Diagnosis not present

## 2018-07-22 DIAGNOSIS — I2510 Atherosclerotic heart disease of native coronary artery without angina pectoris: Secondary | ICD-10-CM | POA: Diagnosis not present

## 2018-07-22 DIAGNOSIS — I110 Hypertensive heart disease with heart failure: Secondary | ICD-10-CM | POA: Diagnosis not present

## 2018-07-22 DIAGNOSIS — E782 Mixed hyperlipidemia: Secondary | ICD-10-CM | POA: Diagnosis not present

## 2018-07-22 DIAGNOSIS — G3184 Mild cognitive impairment, so stated: Secondary | ICD-10-CM | POA: Diagnosis not present

## 2018-07-22 DIAGNOSIS — R69 Illness, unspecified: Secondary | ICD-10-CM | POA: Diagnosis not present

## 2018-07-22 DIAGNOSIS — I5022 Chronic systolic (congestive) heart failure: Secondary | ICD-10-CM | POA: Diagnosis not present

## 2018-07-24 DIAGNOSIS — I5022 Chronic systolic (congestive) heart failure: Secondary | ICD-10-CM | POA: Diagnosis not present

## 2018-07-24 DIAGNOSIS — I2510 Atherosclerotic heart disease of native coronary artery without angina pectoris: Secondary | ICD-10-CM | POA: Diagnosis not present

## 2018-07-24 DIAGNOSIS — I4891 Unspecified atrial fibrillation: Secondary | ICD-10-CM | POA: Diagnosis not present

## 2018-07-24 DIAGNOSIS — G3184 Mild cognitive impairment, so stated: Secondary | ICD-10-CM | POA: Diagnosis not present

## 2018-07-24 DIAGNOSIS — E782 Mixed hyperlipidemia: Secondary | ICD-10-CM | POA: Diagnosis not present

## 2018-07-24 DIAGNOSIS — I447 Left bundle-branch block, unspecified: Secondary | ICD-10-CM | POA: Diagnosis not present

## 2018-07-24 DIAGNOSIS — E119 Type 2 diabetes mellitus without complications: Secondary | ICD-10-CM | POA: Diagnosis not present

## 2018-07-24 DIAGNOSIS — J45909 Unspecified asthma, uncomplicated: Secondary | ICD-10-CM | POA: Diagnosis not present

## 2018-07-24 DIAGNOSIS — R69 Illness, unspecified: Secondary | ICD-10-CM | POA: Diagnosis not present

## 2018-07-24 DIAGNOSIS — I110 Hypertensive heart disease with heart failure: Secondary | ICD-10-CM | POA: Diagnosis not present

## 2018-07-26 DIAGNOSIS — R2689 Other abnormalities of gait and mobility: Secondary | ICD-10-CM | POA: Diagnosis not present

## 2018-07-29 DIAGNOSIS — E782 Mixed hyperlipidemia: Secondary | ICD-10-CM | POA: Diagnosis not present

## 2018-07-29 DIAGNOSIS — I2510 Atherosclerotic heart disease of native coronary artery without angina pectoris: Secondary | ICD-10-CM | POA: Diagnosis not present

## 2018-07-29 DIAGNOSIS — J45909 Unspecified asthma, uncomplicated: Secondary | ICD-10-CM | POA: Diagnosis not present

## 2018-07-29 DIAGNOSIS — I4891 Unspecified atrial fibrillation: Secondary | ICD-10-CM | POA: Diagnosis not present

## 2018-07-29 DIAGNOSIS — I447 Left bundle-branch block, unspecified: Secondary | ICD-10-CM | POA: Diagnosis not present

## 2018-07-29 DIAGNOSIS — I5022 Chronic systolic (congestive) heart failure: Secondary | ICD-10-CM | POA: Diagnosis not present

## 2018-07-29 DIAGNOSIS — G3184 Mild cognitive impairment, so stated: Secondary | ICD-10-CM | POA: Diagnosis not present

## 2018-07-29 DIAGNOSIS — R69 Illness, unspecified: Secondary | ICD-10-CM | POA: Diagnosis not present

## 2018-07-29 DIAGNOSIS — E119 Type 2 diabetes mellitus without complications: Secondary | ICD-10-CM | POA: Diagnosis not present

## 2018-07-29 DIAGNOSIS — I110 Hypertensive heart disease with heart failure: Secondary | ICD-10-CM | POA: Diagnosis not present

## 2018-07-30 DIAGNOSIS — R69 Illness, unspecified: Secondary | ICD-10-CM | POA: Diagnosis not present

## 2018-07-30 DIAGNOSIS — E119 Type 2 diabetes mellitus without complications: Secondary | ICD-10-CM | POA: Diagnosis not present

## 2018-07-30 DIAGNOSIS — G3184 Mild cognitive impairment, so stated: Secondary | ICD-10-CM | POA: Diagnosis not present

## 2018-07-30 DIAGNOSIS — I447 Left bundle-branch block, unspecified: Secondary | ICD-10-CM | POA: Diagnosis not present

## 2018-07-30 DIAGNOSIS — I4891 Unspecified atrial fibrillation: Secondary | ICD-10-CM | POA: Diagnosis not present

## 2018-07-30 DIAGNOSIS — J45909 Unspecified asthma, uncomplicated: Secondary | ICD-10-CM | POA: Diagnosis not present

## 2018-07-30 DIAGNOSIS — I110 Hypertensive heart disease with heart failure: Secondary | ICD-10-CM | POA: Diagnosis not present

## 2018-07-30 DIAGNOSIS — E782 Mixed hyperlipidemia: Secondary | ICD-10-CM | POA: Diagnosis not present

## 2018-07-30 DIAGNOSIS — I5022 Chronic systolic (congestive) heart failure: Secondary | ICD-10-CM | POA: Diagnosis not present

## 2018-07-30 DIAGNOSIS — I2510 Atherosclerotic heart disease of native coronary artery without angina pectoris: Secondary | ICD-10-CM | POA: Diagnosis not present

## 2018-08-02 ENCOUNTER — Telehealth: Payer: Self-pay | Admitting: Internal Medicine

## 2018-08-02 ENCOUNTER — Other Ambulatory Visit: Payer: Self-pay | Admitting: Internal Medicine

## 2018-08-02 DIAGNOSIS — I5022 Chronic systolic (congestive) heart failure: Secondary | ICD-10-CM | POA: Diagnosis not present

## 2018-08-02 DIAGNOSIS — I110 Hypertensive heart disease with heart failure: Secondary | ICD-10-CM | POA: Diagnosis not present

## 2018-08-02 DIAGNOSIS — I2510 Atherosclerotic heart disease of native coronary artery without angina pectoris: Secondary | ICD-10-CM | POA: Diagnosis not present

## 2018-08-02 DIAGNOSIS — J45909 Unspecified asthma, uncomplicated: Secondary | ICD-10-CM | POA: Diagnosis not present

## 2018-08-02 DIAGNOSIS — I447 Left bundle-branch block, unspecified: Secondary | ICD-10-CM | POA: Diagnosis not present

## 2018-08-02 DIAGNOSIS — E782 Mixed hyperlipidemia: Secondary | ICD-10-CM | POA: Diagnosis not present

## 2018-08-02 DIAGNOSIS — R69 Illness, unspecified: Secondary | ICD-10-CM | POA: Diagnosis not present

## 2018-08-02 DIAGNOSIS — G3184 Mild cognitive impairment, so stated: Secondary | ICD-10-CM | POA: Diagnosis not present

## 2018-08-02 DIAGNOSIS — I4891 Unspecified atrial fibrillation: Secondary | ICD-10-CM | POA: Diagnosis not present

## 2018-08-02 DIAGNOSIS — E119 Type 2 diabetes mellitus without complications: Secondary | ICD-10-CM | POA: Diagnosis not present

## 2018-08-02 NOTE — Telephone Encounter (Signed)
Son called stating that patient has had many mini strokes.  States that the patients neurologist has informed the patient to stop driving.  States patient is refusing to stop driving.  Would like to know if Dr. Alain Marion could speak with patient in regard.  Would not like for pt to know he has called.

## 2018-08-03 DIAGNOSIS — I2510 Atherosclerotic heart disease of native coronary artery without angina pectoris: Secondary | ICD-10-CM | POA: Diagnosis not present

## 2018-08-03 DIAGNOSIS — E782 Mixed hyperlipidemia: Secondary | ICD-10-CM | POA: Diagnosis not present

## 2018-08-03 DIAGNOSIS — I5022 Chronic systolic (congestive) heart failure: Secondary | ICD-10-CM | POA: Diagnosis not present

## 2018-08-03 DIAGNOSIS — E119 Type 2 diabetes mellitus without complications: Secondary | ICD-10-CM | POA: Diagnosis not present

## 2018-08-03 DIAGNOSIS — J45909 Unspecified asthma, uncomplicated: Secondary | ICD-10-CM | POA: Diagnosis not present

## 2018-08-03 DIAGNOSIS — G3184 Mild cognitive impairment, so stated: Secondary | ICD-10-CM | POA: Diagnosis not present

## 2018-08-03 DIAGNOSIS — I4891 Unspecified atrial fibrillation: Secondary | ICD-10-CM | POA: Diagnosis not present

## 2018-08-03 DIAGNOSIS — I110 Hypertensive heart disease with heart failure: Secondary | ICD-10-CM | POA: Diagnosis not present

## 2018-08-03 DIAGNOSIS — I447 Left bundle-branch block, unspecified: Secondary | ICD-10-CM | POA: Diagnosis not present

## 2018-08-03 DIAGNOSIS — R69 Illness, unspecified: Secondary | ICD-10-CM | POA: Diagnosis not present

## 2018-08-04 NOTE — Telephone Encounter (Signed)
The patient was advised not to drive at her last office visit in June.  She has another office visit coming up this Monday thanks

## 2018-08-05 ENCOUNTER — Encounter: Payer: Self-pay | Admitting: Internal Medicine

## 2018-08-05 ENCOUNTER — Ambulatory Visit (INDEPENDENT_AMBULATORY_CARE_PROVIDER_SITE_OTHER): Payer: Medicare HMO | Admitting: Internal Medicine

## 2018-08-05 ENCOUNTER — Other Ambulatory Visit: Payer: Self-pay

## 2018-08-05 DIAGNOSIS — E538 Deficiency of other specified B group vitamins: Secondary | ICD-10-CM | POA: Diagnosis not present

## 2018-08-05 DIAGNOSIS — E782 Mixed hyperlipidemia: Secondary | ICD-10-CM | POA: Diagnosis not present

## 2018-08-05 DIAGNOSIS — J45909 Unspecified asthma, uncomplicated: Secondary | ICD-10-CM | POA: Diagnosis not present

## 2018-08-05 DIAGNOSIS — I4891 Unspecified atrial fibrillation: Secondary | ICD-10-CM | POA: Diagnosis not present

## 2018-08-05 DIAGNOSIS — I110 Hypertensive heart disease with heart failure: Secondary | ICD-10-CM | POA: Diagnosis not present

## 2018-08-05 DIAGNOSIS — I10 Essential (primary) hypertension: Secondary | ICD-10-CM | POA: Diagnosis not present

## 2018-08-05 DIAGNOSIS — I447 Left bundle-branch block, unspecified: Secondary | ICD-10-CM | POA: Diagnosis not present

## 2018-08-05 DIAGNOSIS — E119 Type 2 diabetes mellitus without complications: Secondary | ICD-10-CM

## 2018-08-05 DIAGNOSIS — I639 Cerebral infarction, unspecified: Secondary | ICD-10-CM

## 2018-08-05 DIAGNOSIS — F01518 Vascular dementia, unspecified severity, with other behavioral disturbance: Secondary | ICD-10-CM

## 2018-08-05 DIAGNOSIS — F0151 Vascular dementia with behavioral disturbance: Secondary | ICD-10-CM

## 2018-08-05 DIAGNOSIS — I5022 Chronic systolic (congestive) heart failure: Secondary | ICD-10-CM | POA: Diagnosis not present

## 2018-08-05 DIAGNOSIS — I2510 Atherosclerotic heart disease of native coronary artery without angina pectoris: Secondary | ICD-10-CM

## 2018-08-05 DIAGNOSIS — I480 Paroxysmal atrial fibrillation: Secondary | ICD-10-CM

## 2018-08-05 DIAGNOSIS — G3184 Mild cognitive impairment, so stated: Secondary | ICD-10-CM | POA: Diagnosis not present

## 2018-08-05 DIAGNOSIS — R69 Illness, unspecified: Secondary | ICD-10-CM | POA: Diagnosis not present

## 2018-08-05 MED ORDER — CYANOCOBALAMIN 1000 MCG/ML IJ SOLN
1000.0000 ug | Freq: Once | INTRAMUSCULAR | Status: AC
  Administered 2018-08-05: 1000 ug via INTRAMUSCULAR

## 2018-08-05 MED ORDER — BISOPROLOL FUMARATE 5 MG PO TABS: 5.0000 mg | ORAL_TABLET | Freq: Two times a day (BID) | ORAL | 3 refills | Status: DC

## 2018-08-05 MED ORDER — CYANOCOBALAMIN 1000 MCG SL SUBL: 1.0000 | SUBLINGUAL_TABLET | Freq: Every day | SUBLINGUAL | 11 refills | Status: DC

## 2018-08-05 NOTE — Addendum Note (Signed)
Addended by: Cresenciano Lick on: 08/05/2018 02:05 PM   Modules accepted: Orders

## 2018-08-05 NOTE — Assessment & Plan Note (Signed)
Better No driving!

## 2018-08-05 NOTE — Assessment & Plan Note (Addendum)
SBP 150-160s - not well controlled On Telmisartan,  Bisoprolol - increase to BID

## 2018-08-05 NOTE — Assessment & Plan Note (Signed)
Eliquis 

## 2018-08-05 NOTE — Progress Notes (Signed)
Subjective:  Patient ID: Alicia Guerrero, female    DOB: 07/22/1935  Age: 83 y.o. MRN: [id number]  CC: No chief complaint on file.   HPI Alicia Guerrero presents for confusion - no relapse per pt, B12 def  Outpatient Medications Prior to Visit  Medication Sig Dispense Refill  . apixaban (ELIQUIS) 5 MG TABS tablet Take 1 tablet (5 mg total) by mouth 2 (two) times daily. 180 tablet 3  . atorvastatin (LIPITOR) 40 MG tablet TAKE 1 TABLET DAILY. PLEASEKEEP SCHEDULED APPOINTMENT WITH    YOUR DOCTOR FOR    REFILLS 90 tablet 3  . bisoprolol (ZEBETA) 5 MG tablet TAKE 1 TABLET DAILY (Patient taking differently: Take 5 mg by mouth daily. ) 90 tablet 2  . Blood Glucose Monitoring Suppl (ONE TOUCH ULTRA 2) w/Device KIT Use to check blood sugars daily Dx e11.9 1 each 0  . cetirizine (ZYRTEC) 10 MG tablet Take 1 tablet (10 mg total) by mouth as needed for allergies. (Patient taking differently: Take 10 mg by mouth daily. ) 90 tablet 3  . Cyanocobalamin 1000 MCG SUBL Place 1 tablet (1,000 mcg total) under the tongue daily. 100 tablet 11  . furosemide (LASIX) 40 MG tablet Take 1 tablet (40 mg total) by mouth daily. Must keep scheduled appt for future refills 90 tablet 1  . glucose blood (ONE TOUCH ULTRA TEST) test strip 1 each by Other route daily as needed for other. Use to check blood sugars twice a day Dx E11.9 50 each 11  . Lancets (ONETOUCH ULTRASOFT) lancets 1 each by Other route 2 (two) times daily. Use to check blood sugars twice a day Dx E11.9 100 each 3  . metFORMIN (GLUCOPHAGE) 500 MG tablet Take 1 tablet (500 mg total) by mouth daily with breakfast. Must keep scheduled appt for future refills 90 tablet 3  . Multiple Vitamins-Minerals (PRESERVISION AREDS PO) Take 1 capsule by mouth 2 (two) times daily.    . nitroGLYCERIN (NITROSTAT) 0.4 MG SL tablet Place 1 tablet (0.4 mg total) under the tongue every 5 (five) minutes x 3 doses as needed for chest pain. Chest pain 20 tablet 3  . potassium chloride  SA (K-DUR,KLOR-CON) 20 MEQ tablet Take 1 tablet (20 mEq total) by mouth daily. 90 tablet 3  . Respiratory Therapy Supplies (NEBULIZER) DEVI Use with solution Q6H PRN wheezing or shortness of breath 1 each 0  . telmisartan (MICARDIS) 40 MG tablet Take 1 tablet (40 mg total) by mouth daily. 90 tablet 3   No facility-administered medications prior to visit.     ROS: Review of Systems  Constitutional: Negative for activity change, appetite change, chills, fatigue and unexpected weight change.  HENT: Negative for congestion, mouth sores and sinus pressure.   Eyes: Negative for visual disturbance.  Respiratory: Negative for cough and chest tightness.   Gastrointestinal: Negative for abdominal pain and nausea.  Genitourinary: Negative for difficulty urinating, frequency and vaginal pain.  Musculoskeletal: Negative for back pain and gait problem.  Skin: Negative for pallor and rash.  Neurological: Negative for dizziness, tremors, weakness, numbness and headaches.  Psychiatric/Behavioral: Positive for decreased concentration. Negative for behavioral problems, confusion, sleep disturbance and suicidal ideas.    Objective:  BP (!) 158/90 (BP Location: Left Arm, Patient Position: Sitting, Cuff Size: Normal)   Pulse 67   Temp 99 F (37.2 C) (Oral)   Ht 5' 3"  (1.6 m)   Wt 157 lb (71.2 kg)   SpO2 95%   BMI 27.81 kg/m  BP Readings from Last 3 Encounters:  08/05/18 (!) 158/90  07/04/18 136/88  06/26/18 (!) 159/76    Wt Readings from Last 3 Encounters:  08/05/18 157 lb (71.2 kg)  07/04/18 155 lb (70.3 kg)  01/28/18 174 lb (78.9 kg)    Physical Exam Constitutional:      General: She is not in acute distress.    Appearance: She is well-developed.  HENT:     Head: Normocephalic.     Right Ear: External ear normal.     Left Ear: External ear normal.     Nose: Nose normal.  Eyes:     General:        Right eye: No discharge.        Left eye: No discharge.     Conjunctiva/sclera:  Conjunctivae normal.     Pupils: Pupils are equal, round, and reactive to light.  Neck:     Musculoskeletal: Normal range of motion and neck supple.     Thyroid: No thyromegaly.     Vascular: No JVD.     Trachea: No tracheal deviation.  Cardiovascular:     Rate and Rhythm: Normal rate and regular rhythm.     Heart sounds: Normal heart sounds.  Pulmonary:     Effort: No respiratory distress.     Breath sounds: No stridor. No wheezing.  Abdominal:     General: Bowel sounds are normal. There is no distension.     Palpations: Abdomen is soft. There is no mass.     Tenderness: There is no abdominal tenderness. There is no guarding or rebound.  Musculoskeletal:        General: No tenderness.  Lymphadenopathy:     Cervical: No cervical adenopathy.  Skin:    Findings: No erythema or rash.  Neurological:     Cranial Nerves: No cranial nerve deficit.     Motor: No abnormal muscle tone.     Coordination: Coordination normal.     Deep Tendon Reflexes: Reflexes normal.  Psychiatric:        Behavior: Behavior normal.        Thought Content: Thought content normal.        Judgment: Judgment normal.   alert, cooperative, cooperative  Lab Results  Component Value Date   WBC 6.3 06/25/2018   HGB 12.9 06/25/2018   HCT 41.5 06/25/2018   PLT 269 06/25/2018   GLUCOSE 147 (H) 06/25/2018   CHOL 203 (H) 06/26/2018   TRIG 196 (H) 06/26/2018   HDL 44 06/26/2018   LDLDIRECT 91.1 03/21/2010   LDLCALC 120 (H) 06/26/2018   ALT 16 06/25/2018   AST 19 06/25/2018   NA 142 06/25/2018   K 3.3 (L) 06/25/2018   CL 106 06/25/2018   CREATININE 0.92 06/25/2018   BUN 9 06/25/2018   CO2 23 06/25/2018   TSH 2.054 06/25/2018   INR 3.3 12/09/2012   HGBA1C 6.4 (H) 06/26/2018    Ct Head Wo Contrast  Result Date: 06/25/2018 CLINICAL DATA:  Imbalance, forgetfulness. Expressive aphasia. EXAM: CT HEAD WITHOUT CONTRAST TECHNIQUE: Contiguous axial images were obtained from the base of the skull through the  vertex without intravenous contrast. COMPARISON:  12/06/2012. FINDINGS: Brain: No evidence for acute infarction, hemorrhage, mass lesion, or extra-axial fluid. Generalized atrophy, not unexpected for age. Large cortical infarcts affect the LEFT frontal and posterior frontal regions of the brain, with encephalomalacia and regional gliosis. Superimposed is extensive chronic microvascular ischemic change throughout the white matter. Hydrocephalus ex vacuo. Remote LEFT thalamic  lacunar infarct could have been acute in 2014. There may be a chronic LEFT brainstem infarct at the lower midbrain/upper pontine level. Vascular: Calcification of the cavernous internal carotid arteries consistent with cerebrovascular atherosclerotic disease. No signs of intracranial large vessel occlusion. Skull: Calvarium intact. Sinuses/Orbits: No acute findings. Other: None. IMPRESSION: Chronic changes as described. Remote LEFT hemisphere infarcts similar to priors. No acute intracranial findings. Electronically Signed   By: Staci Righter M.D.   On: 06/25/2018 15:19   Mr Brain Wo Contrast  Result Date: 06/26/2018 CLINICAL DATA:  Memory decline over the past 2 weeks. EXAM: MRI HEAD WITHOUT CONTRAST TECHNIQUE: Multiplanar, multiecho pulse sequences of the brain and surrounding structures were obtained without intravenous contrast. COMPARISON:  Head CT 06/25/2018 FINDINGS: Brain: There is no evidence of acute infarct, intracranial hemorrhage, mass, midline shift, or extra-axial fluid collection. Chronic moderate-sized infarcts are noted in the left frontal and left parietal lobes. Chronic lacunar infarcts are noted in the left thalamus and at the midbrain-pons junction just left of midline. Patchy to confluent T2 hyperintensities in the cerebral white matter bilaterally are nonspecific but compatible with moderate to severe chronic small vessel ischemic disease. There is moderate cerebral atrophy. Vascular: Major intracranial vascular flow  voids are preserved. Skull and upper cervical spine: Unremarkable bone marrow signal. Sinuses/Orbits: Bilateral cataract extraction. Chronic left maxillary sinusitis. Trace mastoid effusions. Other: None. IMPRESSION: 1. No acute intracranial abnormality. 2. Moderate to severe chronic small vessel ischemic disease with multiple chronic infarcts as above. Electronically Signed   By: Logan Bores M.D.   On: 06/26/2018 16:12   Vas US Carotid (at [place] Only)  Result Date: 06/26/2018 Carotid Arterial Duplex Study Indications:       CVA and balance problem. Risk Factors:      Hypertension, hyperlipidemia. Comparison Study:  no prior Performing Technologist: June Leap RDMS, RVT  Examination Guidelines: A complete evaluation includes B-mode imaging, spectral Doppler, color Doppler, and power Doppler as needed of all accessible portions of each vessel. Bilateral testing is considered an integral part of a complete examination. Limited examinations for reoccurring indications may be performed as noted.  Right Carotid Findings: +----------+--------+--------+--------+------------+--------+           PSV cm/sEDV cm/sStenosisDescribe    Comments +----------+--------+--------+--------+------------+--------+ CCA Prox  51      9                                    +----------+--------+--------+--------+------------+--------+ CCA Distal62      14                                   +----------+--------+--------+--------+------------+--------+ ICA Prox  65      13      1-39%   heterogenous         +----------+--------+--------+--------+------------+--------+ ICA Distal62      21                                   +----------+--------+--------+--------+------------+--------+ ECA       66      10                                   +----------+--------+--------+--------+------------+--------+ +----------+--------+-------+----------------+-------------------+  PSV cm/sEDV  cmsDescribe        Arm Pressure (mmHG) +----------+--------+-------+----------------+-------------------+ [id number]             Multiphasic, WNL                    +----------+--------+-------+----------------+-------------------+ +---------+--------+--+--------+-+---------+ VertebralPSV cm/s34EDV cm/s8Antegrade +---------+--------+--+--------+-+---------+  Left Carotid Findings: +----------+--------+--------+--------+------------+--------+           PSV cm/sEDV cm/sStenosisDescribe    Comments +----------+--------+--------+--------+------------+--------+ CCA Prox  56      11                                   +----------+--------+--------+--------+------------+--------+ CCA Distal69      12                                   +----------+--------+--------+--------+------------+--------+ ICA Prox  49      11      1-39%   heterogenous         +----------+--------+--------+--------+------------+--------+ ICA Distal62      11                                   +----------+--------+--------+--------+------------+--------+ ECA       57      4                                    +----------+--------+--------+--------+------------+--------+ +----------+--------+--------+----------------+-------------------+ SubclavianPSV cm/sEDV cm/sDescribe        Arm Pressure (mmHG) +----------+--------+--------+----------------+-------------------+           98              Multiphasic, WNL                    +----------+--------+--------+----------------+-------------------+ +---------+--------+--+--------+-+---------+ VertebralPSV cm/s25EDV cm/s5Antegrade +---------+--------+--+--------+-+---------+  Summary: Right Carotid: Velocities in the right ICA are consistent with a 1-39% stenosis. Left Carotid: Velocities in the left ICA are consistent with a 1-39% stenosis. Vertebrals:  Bilateral vertebral arteries demonstrate antegrade flow. Subclavians: Normal flow  hemodynamics were seen in bilateral subclavian              arteries. *See table(s) above for measurements and observations.  Electronically signed by Antony Contras MD on 06/26/2018 at 12:48:03 PM.    Final     Assessment & Plan:   There are no diagnoses linked to this encounter.   No orders of the defined types were placed in this encounter.    Follow-up: No follow-ups on file.  Walker Kehr, MD

## 2018-08-05 NOTE — Assessment & Plan Note (Signed)
On Valsartan, Bisoprolol, Lipitor

## 2018-08-05 NOTE — Patient Instructions (Signed)
No driving °

## 2018-08-05 NOTE — Assessment & Plan Note (Signed)
On Telmisartan, Bisoprolol

## 2018-08-05 NOTE — Assessment & Plan Note (Signed)
On Eliquis

## 2018-08-05 NOTE — Assessment & Plan Note (Signed)
Start B12 Risks associated with treatment noncompliance were discussed. Compliance was encouraged.

## 2018-08-05 NOTE — Assessment & Plan Note (Signed)
Metformin 

## 2018-08-09 DIAGNOSIS — G3184 Mild cognitive impairment, so stated: Secondary | ICD-10-CM | POA: Diagnosis not present

## 2018-08-09 DIAGNOSIS — J45909 Unspecified asthma, uncomplicated: Secondary | ICD-10-CM | POA: Diagnosis not present

## 2018-08-09 DIAGNOSIS — E782 Mixed hyperlipidemia: Secondary | ICD-10-CM | POA: Diagnosis not present

## 2018-08-09 DIAGNOSIS — I4891 Unspecified atrial fibrillation: Secondary | ICD-10-CM | POA: Diagnosis not present

## 2018-08-09 DIAGNOSIS — I5022 Chronic systolic (congestive) heart failure: Secondary | ICD-10-CM | POA: Diagnosis not present

## 2018-08-09 DIAGNOSIS — R69 Illness, unspecified: Secondary | ICD-10-CM | POA: Diagnosis not present

## 2018-08-09 DIAGNOSIS — I2510 Atherosclerotic heart disease of native coronary artery without angina pectoris: Secondary | ICD-10-CM | POA: Diagnosis not present

## 2018-08-09 DIAGNOSIS — I110 Hypertensive heart disease with heart failure: Secondary | ICD-10-CM | POA: Diagnosis not present

## 2018-08-09 DIAGNOSIS — I447 Left bundle-branch block, unspecified: Secondary | ICD-10-CM | POA: Diagnosis not present

## 2018-08-09 DIAGNOSIS — E119 Type 2 diabetes mellitus without complications: Secondary | ICD-10-CM | POA: Diagnosis not present

## 2018-08-12 DIAGNOSIS — E782 Mixed hyperlipidemia: Secondary | ICD-10-CM | POA: Diagnosis not present

## 2018-08-12 DIAGNOSIS — G3184 Mild cognitive impairment, so stated: Secondary | ICD-10-CM | POA: Diagnosis not present

## 2018-08-12 DIAGNOSIS — I110 Hypertensive heart disease with heart failure: Secondary | ICD-10-CM | POA: Diagnosis not present

## 2018-08-12 DIAGNOSIS — I2510 Atherosclerotic heart disease of native coronary artery without angina pectoris: Secondary | ICD-10-CM | POA: Diagnosis not present

## 2018-08-12 DIAGNOSIS — E119 Type 2 diabetes mellitus without complications: Secondary | ICD-10-CM | POA: Diagnosis not present

## 2018-08-12 DIAGNOSIS — R69 Illness, unspecified: Secondary | ICD-10-CM | POA: Diagnosis not present

## 2018-08-12 DIAGNOSIS — J45909 Unspecified asthma, uncomplicated: Secondary | ICD-10-CM | POA: Diagnosis not present

## 2018-08-12 DIAGNOSIS — I4891 Unspecified atrial fibrillation: Secondary | ICD-10-CM | POA: Diagnosis not present

## 2018-08-12 DIAGNOSIS — I447 Left bundle-branch block, unspecified: Secondary | ICD-10-CM | POA: Diagnosis not present

## 2018-08-12 DIAGNOSIS — I5022 Chronic systolic (congestive) heart failure: Secondary | ICD-10-CM | POA: Diagnosis not present

## 2018-08-16 DIAGNOSIS — I447 Left bundle-branch block, unspecified: Secondary | ICD-10-CM | POA: Diagnosis not present

## 2018-08-16 DIAGNOSIS — G3184 Mild cognitive impairment, so stated: Secondary | ICD-10-CM | POA: Diagnosis not present

## 2018-08-16 DIAGNOSIS — I2510 Atherosclerotic heart disease of native coronary artery without angina pectoris: Secondary | ICD-10-CM | POA: Diagnosis not present

## 2018-08-16 DIAGNOSIS — R69 Illness, unspecified: Secondary | ICD-10-CM | POA: Diagnosis not present

## 2018-08-16 DIAGNOSIS — J45909 Unspecified asthma, uncomplicated: Secondary | ICD-10-CM | POA: Diagnosis not present

## 2018-08-16 DIAGNOSIS — I5022 Chronic systolic (congestive) heart failure: Secondary | ICD-10-CM | POA: Diagnosis not present

## 2018-08-16 DIAGNOSIS — E782 Mixed hyperlipidemia: Secondary | ICD-10-CM | POA: Diagnosis not present

## 2018-08-16 DIAGNOSIS — E119 Type 2 diabetes mellitus without complications: Secondary | ICD-10-CM | POA: Diagnosis not present

## 2018-08-16 DIAGNOSIS — I110 Hypertensive heart disease with heart failure: Secondary | ICD-10-CM | POA: Diagnosis not present

## 2018-08-16 DIAGNOSIS — I4891 Unspecified atrial fibrillation: Secondary | ICD-10-CM | POA: Diagnosis not present

## 2018-08-19 DIAGNOSIS — E119 Type 2 diabetes mellitus without complications: Secondary | ICD-10-CM | POA: Diagnosis not present

## 2018-08-19 DIAGNOSIS — G3184 Mild cognitive impairment, so stated: Secondary | ICD-10-CM | POA: Diagnosis not present

## 2018-08-19 DIAGNOSIS — J45909 Unspecified asthma, uncomplicated: Secondary | ICD-10-CM | POA: Diagnosis not present

## 2018-08-19 DIAGNOSIS — I5022 Chronic systolic (congestive) heart failure: Secondary | ICD-10-CM | POA: Diagnosis not present

## 2018-08-19 DIAGNOSIS — I110 Hypertensive heart disease with heart failure: Secondary | ICD-10-CM | POA: Diagnosis not present

## 2018-08-19 DIAGNOSIS — R69 Illness, unspecified: Secondary | ICD-10-CM | POA: Diagnosis not present

## 2018-08-19 DIAGNOSIS — E782 Mixed hyperlipidemia: Secondary | ICD-10-CM | POA: Diagnosis not present

## 2018-08-19 DIAGNOSIS — I2510 Atherosclerotic heart disease of native coronary artery without angina pectoris: Secondary | ICD-10-CM | POA: Diagnosis not present

## 2018-08-19 DIAGNOSIS — I4891 Unspecified atrial fibrillation: Secondary | ICD-10-CM | POA: Diagnosis not present

## 2018-08-19 DIAGNOSIS — I447 Left bundle-branch block, unspecified: Secondary | ICD-10-CM | POA: Diagnosis not present

## 2018-08-21 ENCOUNTER — Other Ambulatory Visit: Payer: Self-pay | Admitting: Internal Medicine

## 2018-08-22 DIAGNOSIS — E119 Type 2 diabetes mellitus without complications: Secondary | ICD-10-CM | POA: Diagnosis not present

## 2018-08-22 DIAGNOSIS — J45909 Unspecified asthma, uncomplicated: Secondary | ICD-10-CM | POA: Diagnosis not present

## 2018-08-22 DIAGNOSIS — I4891 Unspecified atrial fibrillation: Secondary | ICD-10-CM | POA: Diagnosis not present

## 2018-08-22 DIAGNOSIS — R69 Illness, unspecified: Secondary | ICD-10-CM | POA: Diagnosis not present

## 2018-08-22 DIAGNOSIS — E782 Mixed hyperlipidemia: Secondary | ICD-10-CM | POA: Diagnosis not present

## 2018-08-22 DIAGNOSIS — G3184 Mild cognitive impairment, so stated: Secondary | ICD-10-CM | POA: Diagnosis not present

## 2018-08-22 DIAGNOSIS — I5022 Chronic systolic (congestive) heart failure: Secondary | ICD-10-CM | POA: Diagnosis not present

## 2018-08-22 DIAGNOSIS — I110 Hypertensive heart disease with heart failure: Secondary | ICD-10-CM | POA: Diagnosis not present

## 2018-08-22 DIAGNOSIS — I447 Left bundle-branch block, unspecified: Secondary | ICD-10-CM | POA: Diagnosis not present

## 2018-08-22 DIAGNOSIS — I2510 Atherosclerotic heart disease of native coronary artery without angina pectoris: Secondary | ICD-10-CM | POA: Diagnosis not present

## 2018-08-26 DIAGNOSIS — R2689 Other abnormalities of gait and mobility: Secondary | ICD-10-CM | POA: Diagnosis not present

## 2018-08-27 DIAGNOSIS — E119 Type 2 diabetes mellitus without complications: Secondary | ICD-10-CM | POA: Diagnosis not present

## 2018-08-27 DIAGNOSIS — I2510 Atherosclerotic heart disease of native coronary artery without angina pectoris: Secondary | ICD-10-CM | POA: Diagnosis not present

## 2018-08-27 DIAGNOSIS — I5022 Chronic systolic (congestive) heart failure: Secondary | ICD-10-CM | POA: Diagnosis not present

## 2018-08-27 DIAGNOSIS — G3184 Mild cognitive impairment, so stated: Secondary | ICD-10-CM | POA: Diagnosis not present

## 2018-08-27 DIAGNOSIS — E782 Mixed hyperlipidemia: Secondary | ICD-10-CM | POA: Diagnosis not present

## 2018-08-27 DIAGNOSIS — J45909 Unspecified asthma, uncomplicated: Secondary | ICD-10-CM | POA: Diagnosis not present

## 2018-08-27 DIAGNOSIS — I4891 Unspecified atrial fibrillation: Secondary | ICD-10-CM | POA: Diagnosis not present

## 2018-08-27 DIAGNOSIS — I447 Left bundle-branch block, unspecified: Secondary | ICD-10-CM | POA: Diagnosis not present

## 2018-08-27 DIAGNOSIS — R69 Illness, unspecified: Secondary | ICD-10-CM | POA: Diagnosis not present

## 2018-08-27 DIAGNOSIS — I110 Hypertensive heart disease with heart failure: Secondary | ICD-10-CM | POA: Diagnosis not present

## 2018-09-16 ENCOUNTER — Ambulatory Visit (INDEPENDENT_AMBULATORY_CARE_PROVIDER_SITE_OTHER): Payer: Medicare HMO | Admitting: *Deleted

## 2018-09-16 DIAGNOSIS — I255 Ischemic cardiomyopathy: Secondary | ICD-10-CM

## 2018-09-16 LAB — CUP PACEART REMOTE DEVICE CHECK
Battery Remaining Longevity: 37 mo
Battery Remaining Percentage: 70 %
Battery Voltage: 2.95 V
Brady Statistic AP VP Percent: 92 %
Brady Statistic AP VS Percent: 1 %
Brady Statistic AS VP Percent: 5.3 %
Brady Statistic AS VS Percent: 1 %
Brady Statistic RA Percent Paced: 91 %
Date Time Interrogation Session: 20200831060017
HighPow Impedance: 57 Ohm
HighPow Impedance: 57 Ohm
Implantable Lead Implant Date: 20131017
Implantable Lead Implant Date: 20131017
Implantable Lead Implant Date: 20131017
Implantable Lead Location: 753858
Implantable Lead Location: 753859
Implantable Lead Location: 753860
Implantable Pulse Generator Implant Date: 20190426
Lead Channel Impedance Value: 330 Ohm
Lead Channel Impedance Value: 360 Ohm
Lead Channel Impedance Value: 750 Ohm
Lead Channel Pacing Threshold Amplitude: 0.875 V
Lead Channel Pacing Threshold Amplitude: 1 V
Lead Channel Pacing Threshold Amplitude: 3.25 V
Lead Channel Pacing Threshold Pulse Width: 0.5 ms
Lead Channel Pacing Threshold Pulse Width: 0.5 ms
Lead Channel Pacing Threshold Pulse Width: 1.5 ms
Lead Channel Sensing Intrinsic Amplitude: 12 mV
Lead Channel Sensing Intrinsic Amplitude: 2.7 mV
Lead Channel Setting Pacing Amplitude: 2 V
Lead Channel Setting Pacing Amplitude: 2 V
Lead Channel Setting Pacing Amplitude: 3.75 V
Lead Channel Setting Pacing Pulse Width: 0.5 ms
Lead Channel Setting Pacing Pulse Width: 1.5 ms
Lead Channel Setting Sensing Sensitivity: 0.5 mV
Pulse Gen Serial Number: 9820905

## 2018-09-23 ENCOUNTER — Other Ambulatory Visit: Payer: Self-pay | Admitting: Internal Medicine

## 2018-09-24 ENCOUNTER — Encounter: Payer: Self-pay | Admitting: Cardiology

## 2018-09-24 NOTE — Progress Notes (Signed)
Remote ICD transmission.   

## 2018-09-24 NOTE — Telephone Encounter (Signed)
Age 83, weight 71kg, SCr 0.92 on 06/25/18, last appt 09/11/17, no appts scheduled currently. Will send in 1 month refill with note to schedule MD follow up appt. Note also sent to schedulers to follow up with pt.

## 2018-09-26 DIAGNOSIS — R2689 Other abnormalities of gait and mobility: Secondary | ICD-10-CM | POA: Diagnosis not present

## 2018-09-30 ENCOUNTER — Other Ambulatory Visit: Payer: Self-pay | Admitting: Internal Medicine

## 2018-09-30 NOTE — Telephone Encounter (Signed)
81f 71.2kg Scr 0.92 06/25/18 Lovw/klein 09/11/17

## 2018-10-02 DIAGNOSIS — R69 Illness, unspecified: Secondary | ICD-10-CM | POA: Diagnosis not present

## 2018-10-26 DIAGNOSIS — R2689 Other abnormalities of gait and mobility: Secondary | ICD-10-CM | POA: Diagnosis not present

## 2018-11-26 DIAGNOSIS — R2689 Other abnormalities of gait and mobility: Secondary | ICD-10-CM | POA: Diagnosis not present

## 2018-12-16 ENCOUNTER — Ambulatory Visit (INDEPENDENT_AMBULATORY_CARE_PROVIDER_SITE_OTHER): Payer: Medicare HMO | Admitting: *Deleted

## 2018-12-16 DIAGNOSIS — Z95810 Presence of automatic (implantable) cardiac defibrillator: Secondary | ICD-10-CM | POA: Diagnosis not present

## 2018-12-16 LAB — CUP PACEART REMOTE DEVICE CHECK
Battery Remaining Longevity: 35 mo
Battery Remaining Percentage: 65 %
Battery Voltage: 2.93 V
Brady Statistic AP VP Percent: 93 %
Brady Statistic AP VS Percent: 1 %
Brady Statistic AS VP Percent: 4 %
Brady Statistic AS VS Percent: 1 %
Brady Statistic RA Percent Paced: 93 %
Date Time Interrogation Session: 20201130020017
HighPow Impedance: 64 Ohm
HighPow Impedance: 64 Ohm
Implantable Lead Implant Date: 20131017
Implantable Lead Implant Date: 20131017
Implantable Lead Implant Date: 20131017
Implantable Lead Location: 753858
Implantable Lead Location: 753859
Implantable Lead Location: 753860
Implantable Pulse Generator Implant Date: 20190426
Lead Channel Impedance Value: 340 Ohm
Lead Channel Impedance Value: 380 Ohm
Lead Channel Impedance Value: 750 Ohm
Lead Channel Pacing Threshold Amplitude: 0.875 V
Lead Channel Pacing Threshold Amplitude: 1.25 V
Lead Channel Pacing Threshold Amplitude: 3.125 V
Lead Channel Pacing Threshold Pulse Width: 0.5 ms
Lead Channel Pacing Threshold Pulse Width: 0.5 ms
Lead Channel Pacing Threshold Pulse Width: 1.5 ms
Lead Channel Sensing Intrinsic Amplitude: 11.7 mV
Lead Channel Sensing Intrinsic Amplitude: 2.5 mV
Lead Channel Setting Pacing Amplitude: 2 V
Lead Channel Setting Pacing Amplitude: 2.25 V
Lead Channel Setting Pacing Amplitude: 3.625
Lead Channel Setting Pacing Pulse Width: 0.5 ms
Lead Channel Setting Pacing Pulse Width: 1.5 ms
Lead Channel Setting Sensing Sensitivity: 0.5 mV
Pulse Gen Serial Number: 9820905

## 2018-12-19 ENCOUNTER — Other Ambulatory Visit: Payer: Self-pay | Admitting: Internal Medicine

## 2018-12-26 DIAGNOSIS — R2689 Other abnormalities of gait and mobility: Secondary | ICD-10-CM | POA: Diagnosis not present

## 2019-01-08 NOTE — Progress Notes (Signed)
ICD remote 

## 2019-01-25 ENCOUNTER — Other Ambulatory Visit: Payer: Self-pay | Admitting: Internal Medicine

## 2019-01-26 DIAGNOSIS — R2689 Other abnormalities of gait and mobility: Secondary | ICD-10-CM | POA: Diagnosis not present

## 2019-01-27 NOTE — Telephone Encounter (Addendum)
Called and spoke to pt's son. Informed him pt will need to schedule an appointment with a cardiologist to keep refilling medication. Transferred him to the main line to schedule an appointment. Will send in a 1 month supply.

## 2019-01-27 NOTE — Telephone Encounter (Signed)
Prescription refill request for Eliquis received.  Last office visit: Alicia Guerrero 09/11/2017 Scr: 0.92, 06/25/2018 Age: 84 y.o. Weight: 71.2 kg   Pt overdue for an office vist

## 2019-02-20 ENCOUNTER — Telehealth (INDEPENDENT_AMBULATORY_CARE_PROVIDER_SITE_OTHER): Payer: Medicare HMO | Admitting: Internal Medicine

## 2019-02-20 ENCOUNTER — Other Ambulatory Visit: Payer: Self-pay

## 2019-02-20 VITALS — BP 167/86 | HR 65

## 2019-02-20 DIAGNOSIS — Z95810 Presence of automatic (implantable) cardiac defibrillator: Secondary | ICD-10-CM

## 2019-02-20 DIAGNOSIS — I4891 Unspecified atrial fibrillation: Secondary | ICD-10-CM | POA: Diagnosis not present

## 2019-02-20 DIAGNOSIS — R001 Bradycardia, unspecified: Secondary | ICD-10-CM

## 2019-02-20 DIAGNOSIS — I5022 Chronic systolic (congestive) heart failure: Secondary | ICD-10-CM | POA: Diagnosis not present

## 2019-02-20 DIAGNOSIS — I4901 Ventricular fibrillation: Secondary | ICD-10-CM

## 2019-02-20 MED ORDER — SPIRONOLACTONE 25 MG PO TABS: 25.0000 mg | ORAL_TABLET | Freq: Every day | ORAL | 3 refills | Status: DC

## 2019-02-20 MED ORDER — NITROGLYCERIN 0.4 MG SL SUBL: 0.4000 mg | SUBLINGUAL_TABLET | SUBLINGUAL | 3 refills | Status: AC | PRN

## 2019-02-20 NOTE — Progress Notes (Signed)
Electrophysiology TeleHealth Note   Due to national recommendations of social distancing due to COVID 19, an audio/video telehealth visit is felt to be most appropriate for this patient at this time.  See MyChart message from today for the patient's consent to telehealth for [facility].   Date:  02/20/2019   ID:  Alicia Guerrero, DOB Nov 09, 1935, MRN [id number]  Location: patient's home  Provider location: [address] [place]  Evaluation Performed: Follow-up visit  PCP:  [name], [name], MD  Cardiologist:     Electrophysiologist:  SK   Chief Complaint:  *congestive heart failure  History of Present Illness:    Alicia Guerrero is a 84 y.o. female who presents via audio/video conferencing for a telehealth visit today.  Since last being seen in our clinic for Seen in followup for aborted cardiac arrest and CRT-D implant ( St Jude 10/13-JA) in the context of coronary artery disease and left bundle branch block, the patient reports doing well   No edema, no sob    She was hospitalized 11/14 for strokes x2 that occurred in the context of a supratherapeutic INR. It was elected to switch her to apixaban.    DATE TEST EF   11/14` Echo  30-35%   4/19 Echo  55-65%   6/20 Echo 55-60% E/E' 15         Date Cr K Hgb  5/17 1.13  13.8  4/19 1.04  13.7  6/20 0.92 3.3 12.9   BP at home 130-165/   The patient denies symptoms of fevers, chills, cough, or new SOB worrisome for COVID 19.  Got vaccine #1  Past Medical History:  Diagnosis Date  . Allergic rhinitis due to pollen   . Asthma   . Atrial fibrillation ([place])   . Biventricular ICD (implantable cardiac defibrillator) in place    St Jude 10/2011 (implant MD: Allred)  . Bradycardia    a. coreg tapered off 05/2011 => b. Metoprolol restarted after cardiac arrest 09/2011  . CAD (coronary artery disease)    a. s/p stent to RCA 1996;  b. s/p cutting balloon PTCA to LAD 2001; c. s/p Cypher DES to LAD 2003;   d. NSTEMI 4/12: DES to dRCA;  e.  [place] 9/13 (after presentation with cardiac arrest and + Nz's for NSTEMI): pLAD 30% ISR, oD1 30%, dLAD 60-70%, pOM1 (small vessel) 90%, oOM2 95% (moderate to large vessel), mCFX 30%, pOM3 30-40%, dRCA stents and pPDA stents patent with 30% ISR => med Rx rec.   . Cancer ([place])   . Cardiac arrest ([place]) 09/2011   a. VT/VF in community; resusc unsuccessful;  PEA in ED; multiple defibs => revived;  [employer];  c/b by VDRF, cardiogenic shock;  LHC with stable anatomy => med Rx;  difficult ween from vent => s/p trach;  d/c to SNF  . Chronic eczema    hands  . Chronic systolic heart failure ([place])   . Hemorrhoids   . HLD (hyperlipidemia)   . HTN (hypertension)   . Ischemic cardiomyopathy    a. EF 40% at cath 4/12 with AL and Inf HK;  b.  Echo after presentation with cardiac arrest 9/13:  EF 40-45% and severe inf HK to AK c/w infarction, PASP 42  . Left bundle branch block   . Other and unspecified ovarian cyst   . Other atopic dermatitis and related conditions   . Personal history of hyperthyroidism   . Personal history  of malignant neoplasm of breast     Past Surgical History:  Procedure Laterality Date  . ABDOMINAL HYSTERECTOMY    . BI-VENTRICULAR IMPLANTABLE CARDIOVERTER DEFIBRILLATOR N/A 11/02/2011   Procedure: BI-VENTRICULAR IMPLANTABLE CARDIOVERTER DEFIBRILLATOR  (CRT-D);  Surgeon: Thompson Grayer, MD;  Location[facility] C[facility] LAB;  Service: Cardiovascular;  Laterality: N/A;  . BIV ICD GENERATOR CHANGEOUT N/A 05/11/2017   Procedure: BIV ICD GENERATOR CHANGEOUT;  Surgeon: [name], MD;  Location: [place] [facility] CV LAB;  Service: Cardiovascular;  Laterality: N/A;  . BREAST LUMPECTOMY  08/24/2003   right lumpectomy+sln,T2N0,ERPR+,Her2-  . CHOLECYSTECTOMY    . COLONOSCOPY    . CORONARY STENT PLACEMENT  may and  july 2012  . LEFT HEART CATHETERIZATION WITH CORONARY ANGIOGRAM N/A 10/09/2011   Procedure: LEFT HEART CATHETERIZATION WITH CORONARY ANGIOGRAM;   Surgeon: Larey Dresser, MD;  Location: [facility] CATH LAB;  Service: Cardiovascular;  Laterality: N/A;  . OOPHORECTOMY    . POLYPECTOMY    . PTCA    . VIDEO BRONCHOSCOPY  10/26/2011   Procedure: VIDEO BRONCHOSCOPY WITHOUT FLUORO;  Surgeon: Raylene Miyamoto, MD;  Location: [facility] ENDOSCOPY;  Service: Endoscopy;  Laterality: Bilateral;    Current Outpatient Medications  Medication Sig Dispense Refill  . atorvastatin (LIPITOR) 40 MG tablet TAKE 1 TABLET DAILY. PLEASEKEEP SCHEDULED APPOINTMENT WITH    YOUR DOCTOR FOR    REFILLS 90 tablet 3  . bisoprolol (ZEBETA) 5 MG tablet Take 1 tablet (5 mg total) by mouth 2 (two) times a day. 180 tablet 3  . cetirizine (ZYRTEC) 10 MG tablet Take 1 tablet (10 mg total) by mouth as needed for allergies. (Patient taking differently: Take 10 mg by mouth daily. ) 90 tablet 3  . Cyanocobalamin 1000 MCG SUBL Place 1 tablet (1,000 mcg total) under the tongue daily. 100 tablet 11  . ELIQUIS 5 MG TABS tablet Take 1 tablet by mouth twice daily 60 tablet 0  . furosemide (LASIX) 40 MG tablet Take 1 tablet (40 mg total) by mouth daily. 90 tablet 1  . KLOR-CON M20 20 MEQ tablet TAKE 1 TABLET DAILY 90 tablet 3  . metFORMIN (GLUCOPHAGE) 500 MG tablet Take 1 tablet (500 mg total) by mouth daily with breakfast. 90 tablet 1  . Multiple Vitamins-Minerals (PRESERVISION AREDS PO) Take 1 capsule by mouth 2 (two) times daily.    . nitroGLYCERIN (NITROSTAT) 0.4 MG SL tablet Place 1 tablet (0.4 mg total) under the tongue every 5 (five) minutes x 3 doses as needed for chest pain. Chest pain 20 tablet 3  . Respiratory Therapy Supplies (NEBULIZER) DEVI Use with solution Q6H PRN wheezing or shortness of breath 1 each 0  . telmisartan (MICARDIS) 40 MG tablet TAKE 1 TABLET DAILY 90 tablet 1  . Blood Glucose Monitoring Suppl (ONE TOUCH ULTRA 2) w/Device KIT Use to check blood sugars daily Dx e11.9 1 each 0  . glucose blood (ONE TOUCH ULTRA TEST) test strip 1 each by Other route daily as needed for  other. Use to check blood sugars twice a day Dx E11.9 50 each 11  . Lancets (ONETOUCH ULTRASOFT) lancets 1 each by Other route 2 (two) times daily. Use to check blood sugars twice a day Dx E11.9 100 each 3   No current facility-administered medications for this visit.    Allergies:   Coreg [carvedilol], [name] [trandolapril], Meperidine hcl, Penicillins, Molds & smuts, Tape, and Tetanus toxoids   Social History:  The patient  reports that she has never smoked. She has  never used smokeless tobacco. She reports that she does not drink alcohol or use drugs.   Family History:  The patient's   family history includes Clotting disorder in her brother; Coronary artery disease in her father; Endometrial cancer in her sister; Heart disease in her brother, brother, and sister; Hypertension in her mother; Uterine cancer in her mother.   ROS:  Please see the history of present illness.   All other systems are personally reviewed and negative.    Exam:    Vital Signs:  BP (!) 167/86   Pulse 65     Labs/Other Tests and Data Reviewed:    Recent Labs: 06/25/2018: ALT 16; BUN 9; Creatinine, Ser 0.92; Hemoglobin 12.9; Platelets 269; Potassium 3.3; Sodium 142; TSH 2.054   Wt Readings from Last 3 Encounters:  08/05/18 157 lb (71.2 kg)  07/04/18 155 lb (70.3 kg)  01/28/18 174 lb (78.9 kg)     Other studies personally reviewed: Additional studies/ records that were reviewed today include:As above    Last device remote is reviewed from [facility] PDF dated 11/20 which reveals normal device function,   arrhythmias -  SCAF  ECG personally reviewed from 6/20  AV pacing with upright QRS V1 and RS in lead 1  ASSESSMENT & PLAN:   Atrial fibrillation  Heart Failure-chronic systolic  Hypertension  Ventricular Tachycardia/fibrillation    Sinus Bradycardia  LV lead threshold-high  Biventricular ICD St Jude  Weight loss ( presumably real with two measurement (6&7/20)  Hypokalemia  BP  elevated-- will stop furosemide, potassium and try aldactone--discussed side effects  LV function   No significant atrial fibrillation  On Anticoagulation;  No bleeding issues      COVID 19 screen The patient denies symptoms of COVID 19 at this time.  The importance of social distancing was discussed today.  Follow-up:  66m Next remote: As Scheduled   Current medicines are reviewed at length with the patient today.   The patient has concerns regarding her medicines.  The following changes were made today:  Needs refill for 0.4Mg NTG Stop furosemide Stop Kcl Aldactone 25 mg daily  [facility]/ tests ordered today include: BMET/CBC  about 2 weeks after initiation of spironolactone  No orders of the defined types were placed in this encounter.   Future tests ( post COVID )     Patient Risk:  after full review of this patients clinical status, I feel that they are at moderate risk at this time.  Today, I have spent 17  minutes with the patient with telehealth technology discussing the above.  Signed, SVirl Axe MD  02/20/2019 3:43 PM     [phone]) [phone](fax)   We spent more than 50% of our >30   min visit in face to face counseling regarding the above

## 2019-02-20 NOTE — Patient Instructions (Signed)
Spoke with pt and pt's son [name].  Reviewed dr Olin Pia instructions.  Stop Lasix Stop potassium Start Aldactone 25mg  - 1 tablet by mouth daily NTG 0.4mg  refilled Orders placed for CBC and BMET.  Appointment scheduled for 03/11/2019 at [place] may come anytime between 8am and 430pm. RTC 12 month recall placed Pt and pt's son verbalizes understanding of all instructions given and agrees with current plan.

## 2019-02-26 DIAGNOSIS — R2689 Other abnormalities of gait and mobility: Secondary | ICD-10-CM | POA: Diagnosis not present

## 2019-03-03 ENCOUNTER — Other Ambulatory Visit: Payer: Self-pay | Admitting: Internal Medicine

## 2019-03-03 NOTE — Telephone Encounter (Signed)
Pt last saw Dr Caryl Comes telemedicine [phone], last labs 06/25/18 Creat 0.92, age 84, weight 71.2kg, based on specified criteria pt is on appropriate dosage of Eliquis 5mg  BID.  Will refill rx.

## 2019-03-11 ENCOUNTER — Other Ambulatory Visit: Payer: Medicare HMO

## 2019-03-11 ENCOUNTER — Other Ambulatory Visit: Payer: Self-pay

## 2019-03-11 ENCOUNTER — Encounter (INDEPENDENT_AMBULATORY_CARE_PROVIDER_SITE_OTHER): Payer: Self-pay

## 2019-03-11 DIAGNOSIS — Z95810 Presence of automatic (implantable) cardiac defibrillator: Secondary | ICD-10-CM

## 2019-03-11 DIAGNOSIS — I5022 Chronic systolic (congestive) heart failure: Secondary | ICD-10-CM | POA: Diagnosis not present

## 2019-03-11 DIAGNOSIS — R001 Bradycardia, unspecified: Secondary | ICD-10-CM | POA: Diagnosis not present

## 2019-03-11 DIAGNOSIS — I4901 Ventricular fibrillation: Secondary | ICD-10-CM

## 2019-03-11 DIAGNOSIS — I4891 Unspecified atrial fibrillation: Secondary | ICD-10-CM

## 2019-03-12 LAB — BASIC METABOLIC PANEL
BUN/Creatinine Ratio: 12 (ref 12–28)
BUN: 11 mg/dL (ref 8–27)
CO2: 22 mmol/L (ref 20–29)
Calcium: 9.6 mg/dL (ref 8.7–10.3)
Chloride: 106 mmol/L (ref 96–106)
Creatinine, Ser: 0.94 mg/dL (ref 0.57–1.00)
GFR calc Af Amer: 66 mL/min/{1.73_m2} (ref >59)
GFR calc non Af Amer: 57 mL/min/{1.73_m2} — ABNORMAL LOW (ref >59)
Glucose: 119 mg/dL — ABNORMAL HIGH (ref 65–99)
Potassium: 4 mmol/L (ref 3.5–5.2)
Sodium: 144 mmol/L (ref 134–144)

## 2019-03-12 LAB — CBC
Hematocrit: 38.8 % (ref 34.0–46.6)
Hemoglobin: 12.8 g/dL (ref 11.1–15.9)
MCH: 27.2 pg (ref 26.6–33.0)
MCHC: 33 g/dL (ref 31.5–35.7)
MCV: 83 fL (ref 79–97)
Platelets: 286 10*3/uL (ref 150–450)
RBC: 4.7 x10E6/uL (ref 3.77–5.28)
RDW: 13.3 % (ref 11.7–15.4)
WBC: 7 10*3/uL (ref 3.4–10.8)

## 2019-03-17 ENCOUNTER — Ambulatory Visit (INDEPENDENT_AMBULATORY_CARE_PROVIDER_SITE_OTHER): Payer: Medicare HMO | Admitting: *Deleted

## 2019-03-17 DIAGNOSIS — Z95810 Presence of automatic (implantable) cardiac defibrillator: Secondary | ICD-10-CM | POA: Diagnosis not present

## 2019-03-17 LAB — CUP PACEART REMOTE DEVICE CHECK
Battery Remaining Longevity: 31 mo
Battery Remaining Percentage: 60 %
Battery Voltage: 2.93 V
Brady Statistic AP VP Percent: 94 %
Brady Statistic AP VS Percent: 1 %
Brady Statistic AS VP Percent: 3.3 %
Brady Statistic AS VS Percent: 1 %
Brady Statistic RA Percent Paced: 94 %
Date Time Interrogation Session: 20210301020016
HighPow Impedance: 60 Ohm
HighPow Impedance: 60 Ohm
Implantable Lead Implant Date: 20131017
Implantable Lead Implant Date: 20131017
Implantable Lead Implant Date: 20131017
Implantable Lead Location: 753858
Implantable Lead Location: 753859
Implantable Lead Location: 753860
Implantable Pulse Generator Implant Date: 20190426
Lead Channel Impedance Value: 330 Ohm
Lead Channel Impedance Value: 360 Ohm
Lead Channel Impedance Value: 710 Ohm
Lead Channel Pacing Threshold Amplitude: 0.875 V
Lead Channel Pacing Threshold Amplitude: 1.125 V
Lead Channel Pacing Threshold Amplitude: 2.625 V
Lead Channel Pacing Threshold Pulse Width: 0.5 ms
Lead Channel Pacing Threshold Pulse Width: 0.5 ms
Lead Channel Pacing Threshold Pulse Width: 1.5 ms
Lead Channel Sensing Intrinsic Amplitude: 12 mV
Lead Channel Sensing Intrinsic Amplitude: 2.3 mV
Lead Channel Setting Pacing Amplitude: 2 V
Lead Channel Setting Pacing Amplitude: 2.125
Lead Channel Setting Pacing Amplitude: 3.125
Lead Channel Setting Pacing Pulse Width: 0.5 ms
Lead Channel Setting Pacing Pulse Width: 1.5 ms
Lead Channel Setting Sensing Sensitivity: 0.5 mV
Pulse Gen Serial Number: 9820905

## 2019-03-17 NOTE — Progress Notes (Signed)
ICD Remote  

## 2019-03-25 ENCOUNTER — Telehealth: Payer: Self-pay

## 2019-03-25 NOTE — Telephone Encounter (Signed)
-----   Message from [name], MD sent at 03/20/2019  8:33 PM EST ----- Please Inform Patient  Labs are normal  K  now normal##  Thanks

## 2019-03-25 NOTE — Telephone Encounter (Signed)
Attempted phone call to pt.  Phone number is "not currently a working number"  Attempted to reach mobile phone number listed, no voicemail message left, unable to understand greeting.

## 2019-03-26 DIAGNOSIS — R2689 Other abnormalities of gait and mobility: Secondary | ICD-10-CM | POA: Diagnosis not present

## 2019-05-06 ENCOUNTER — Telehealth: Payer: Self-pay | Admitting: Internal Medicine

## 2019-05-06 MED ORDER — METFORMIN HCL 500 MG PO TABS: 500.0000 mg | ORAL_TABLET | Freq: Every day | ORAL | 1 refills | Status: DC

## 2019-05-06 NOTE — Telephone Encounter (Signed)
RX sent

## 2019-05-06 NOTE — Telephone Encounter (Signed)
     Patient has no medication remaining   1.Medication Requested:metFORMIN (GLUCOPHAGE) 500 MG tablet  2. Pharmacy (Name, Street, [place]):[facility], [place] [address]  3. On Med List: yes  4. Last Visit with PCP: 08/05/18  5. Next visit date with PCP: 05/21/19   Agent: Please be advised that RX refills may take up to 3 business days. We ask that you follow-up with your pharmacy.

## 2019-05-21 ENCOUNTER — Ambulatory Visit (INDEPENDENT_AMBULATORY_CARE_PROVIDER_SITE_OTHER): Payer: Medicare HMO | Admitting: Internal Medicine

## 2019-05-21 ENCOUNTER — Other Ambulatory Visit: Payer: Self-pay

## 2019-05-21 ENCOUNTER — Encounter: Payer: Self-pay | Admitting: Internal Medicine

## 2019-05-21 DIAGNOSIS — I10 Essential (primary) hypertension: Secondary | ICD-10-CM

## 2019-05-21 DIAGNOSIS — E119 Type 2 diabetes mellitus without complications: Secondary | ICD-10-CM | POA: Diagnosis not present

## 2019-05-21 DIAGNOSIS — E538 Deficiency of other specified B group vitamins: Secondary | ICD-10-CM | POA: Diagnosis not present

## 2019-05-21 DIAGNOSIS — E1121 Type 2 diabetes mellitus with diabetic nephropathy: Secondary | ICD-10-CM

## 2019-05-21 DIAGNOSIS — N1832 Chronic kidney disease, stage 3b: Secondary | ICD-10-CM

## 2019-05-21 DIAGNOSIS — E559 Vitamin D deficiency, unspecified: Secondary | ICD-10-CM

## 2019-05-21 DIAGNOSIS — Z7901 Long term (current) use of anticoagulants: Secondary | ICD-10-CM | POA: Diagnosis not present

## 2019-05-21 LAB — HEPATIC FUNCTION PANEL
ALT: 14 U/L (ref 0–35)
AST: 18 U/L (ref 0–37)
Albumin: 4.6 g/dL (ref 3.5–5.2)
Alkaline Phosphatase: 84 U/L (ref 39–117)
Bilirubin, Direct: 0.1 mg/dL (ref 0.0–0.3)
Total Bilirubin: 0.8 mg/dL (ref 0.2–1.2)
Total Protein: 7.5 g/dL (ref 6.0–8.3)

## 2019-05-21 LAB — BASIC METABOLIC PANEL
BUN: 16 mg/dL (ref 6–23)
CO2: 28 mEq/L (ref 19–32)
Calcium: 9.8 mg/dL (ref 8.4–10.5)
Chloride: 107 mEq/L (ref 96–112)
Creatinine, Ser: 1 mg/dL (ref 0.40–1.20)
GFR: 53.11 mL/min — ABNORMAL LOW (ref >60.00)
Glucose, Bld: 111 mg/dL — ABNORMAL HIGH (ref 70–99)
Potassium: 4.5 mEq/L (ref 3.5–5.1)
Sodium: 141 mEq/L (ref 135–145)

## 2019-05-21 LAB — VITAMIN D 25 HYDROXY (VIT D DEFICIENCY, FRACTURES): VITD: 29.12 ng/mL — ABNORMAL LOW (ref 30.00–100.00)

## 2019-05-21 LAB — VITAMIN B12: Vitamin B-12: >1500 pg/mL — ABNORMAL HIGH (ref 211–911)

## 2019-05-21 LAB — HEMOGLOBIN A1C: Hgb A1c MFr Bld: 7 % — ABNORMAL HIGH (ref 4.6–6.5)

## 2019-05-21 MED ORDER — APIXABAN 5 MG PO TABS: 5.0000 mg | ORAL_TABLET | Freq: Two times a day (BID) | ORAL | 3 refills | Status: DC

## 2019-05-21 MED ORDER — TELMISARTAN 40 MG PO TABS: 40.0000 mg | ORAL_TABLET | Freq: Every day | ORAL | 3 refills | Status: DC

## 2019-05-21 MED ORDER — BISOPROLOL FUMARATE 5 MG PO TABS: 5.0000 mg | ORAL_TABLET | Freq: Two times a day (BID) | ORAL | 3 refills | Status: DC

## 2019-05-21 MED ORDER — CYANOCOBALAMIN 1000 MCG SL SUBL: 1.0000 | SUBLINGUAL_TABLET | Freq: Every day | SUBLINGUAL | 11 refills | Status: DC

## 2019-05-21 MED ORDER — METFORMIN HCL 500 MG PO TABS: 500.0000 mg | ORAL_TABLET | Freq: Every day | ORAL | 3 refills | Status: DC

## 2019-05-21 MED ORDER — ATORVASTATIN CALCIUM 40 MG PO TABS: ORAL_TABLET | ORAL | 3 refills | Status: DC

## 2019-05-21 MED ORDER — TRIAMCINOLONE ACETONIDE 0.5 % EX OINT: 1.0000 "application " | TOPICAL_OINTMENT | Freq: Two times a day (BID) | CUTANEOUS | 3 refills | Status: DC

## 2019-05-21 MED ORDER — VITAMIN D3 50 MCG (2000 UT) PO CAPS: 2000.0000 [IU] | ORAL_CAPSULE | Freq: Every day | ORAL | 3 refills | Status: AC

## 2019-05-21 NOTE — Assessment & Plan Note (Signed)
On Telmisartan, Bisoprolol, Spironolactone

## 2019-05-21 NOTE — Assessment & Plan Note (Signed)
Metformin 

## 2019-05-21 NOTE — Assessment & Plan Note (Signed)
On Eliquis

## 2019-05-21 NOTE — Assessment & Plan Note (Signed)
B 12

## 2019-05-21 NOTE — Assessment & Plan Note (Signed)
Labs

## 2019-05-21 NOTE — Progress Notes (Signed)
Subjective:  Patient ID: Alicia Guerrero, female    DOB: [birth year]  Age: 84 y.o. MRN: [id number]  CC: No chief complaint on file.   HPI [name] presents for CAD, HTN, CVA Dr Caryl Comes changed meds in Feb Comes w/her son today    Outpatient Medications Prior to Visit  Medication Sig Dispense Refill  . atorvastatin (LIPITOR) 40 MG tablet TAKE 1 TABLET DAILY. PLEASEKEEP SCHEDULED APPOINTMENT WITH    YOUR DOCTOR FOR    REFILLS 90 tablet 3  . bisoprolol (ZEBETA) 5 MG tablet Take 1 tablet (5 mg total) by mouth 2 (two) times a day. 180 tablet 3  . Blood Glucose Monitoring Suppl (ONE TOUCH ULTRA 2) w/Device KIT Use to check blood sugars daily Dx e11.9 1 each 0  . cetirizine (ZYRTEC) 10 MG tablet Take 1 tablet (10 mg total) by mouth as needed for allergies. (Patient taking differently: Take 10 mg by mouth daily. ) 90 tablet 3  . Cyanocobalamin 1000 MCG SUBL Place 1 tablet (1,000 mcg total) under the tongue daily. 100 tablet 11  . ELIQUIS 5 MG TABS tablet Take 1 tablet by mouth twice daily 60 tablet 5  . glucose blood (ONE TOUCH ULTRA TEST) test strip 1 each by Other route daily as needed for other. Use to check blood sugars twice a day Dx E11.9 50 each 11  . Lancets (ONETOUCH ULTRASOFT) lancets 1 each by Other route 2 (two) times daily. Use to check blood sugars twice a day Dx E11.9 100 each 3  . metFORMIN (GLUCOPHAGE) 500 MG tablet Take 1 tablet (500 mg total) by mouth daily with breakfast. 90 tablet 1  . Multiple Vitamins-Minerals (PRESERVISION AREDS PO) Take 1 capsule by mouth 2 (two) times daily.    . nitroGLYCERIN (NITROSTAT) 0.4 MG SL tablet Place 1 tablet (0.4 mg total) under the tongue every 5 (five) minutes x 3 doses as needed for chest pain. Chest pain 20 tablet 3  . Respiratory Therapy Supplies (NEBULIZER) DEVI Use with solution Q6H PRN wheezing or shortness of breath 1 each 0  . spironolactone (ALDACTONE) 25 MG tablet Take 1 tablet (25 mg total) by mouth daily. 90 tablet 3  .  telmisartan (MICARDIS) 40 MG tablet TAKE 1 TABLET DAILY 90 tablet 1   No facility-administered medications prior to visit.    ROS: Review of Systems  Constitutional: Negative for activity change, appetite change, chills, fatigue and unexpected weight change.  HENT: Negative for congestion, mouth sores and sinus pressure.   Eyes: Negative for visual disturbance.  Respiratory: Negative for cough and chest tightness.   Gastrointestinal: Negative for abdominal pain and nausea.  Genitourinary: Negative for difficulty urinating, frequency and vaginal pain.  Musculoskeletal: Negative for back pain and gait problem.  Skin: Negative for pallor and rash.  Neurological: Negative for dizziness, tremors, weakness, numbness and headaches.  Psychiatric/Behavioral: Positive for confusion and decreased concentration. Negative for behavioral problems, self-injury and sleep disturbance.    Objective:  BP 132/84 (BP Location: Left Arm, Patient Position: Sitting, Cuff Size: Large)   Pulse 66   Temp 98.5 F (36.9 C) (Oral)   Ht 5' 3"  (1.6 m)   Wt 158 lb (71.7 kg)   SpO2 95%   BMI 27.99 kg/m   BP Readings from Last 3 Encounters:  05/21/19 132/84  02/20/19 (!) 167/86  08/05/18 (!) 158/90    Wt Readings from Last 3 Encounters:  05/21/19 158 lb (71.7 kg)  08/05/18 157 lb (71.2 kg)  07/04/18  155 lb (70.3 kg)    Physical Exam Constitutional:      General: She is not in acute distress.    Appearance: She is well-developed.  HENT:     Head: Normocephalic.     Right Ear: External ear normal.     Left Ear: External ear normal.     Nose: Nose normal.  Eyes:     General:        Right eye: No discharge.        Left eye: No discharge.     Conjunctiva/sclera: Conjunctivae normal.     Pupils: Pupils are equal, round, and reactive to light.  Neck:     Thyroid: No thyromegaly.     Vascular: No JVD.     Trachea: No tracheal deviation.  Cardiovascular:     Rate and Rhythm: Normal rate and regular  rhythm.     Heart sounds: Normal heart sounds.  Pulmonary:     Effort: No respiratory distress.     Breath sounds: No stridor. No wheezing.  Abdominal:     General: Bowel sounds are normal. There is no distension.     Palpations: Abdomen is soft. There is no mass.     Tenderness: There is no abdominal tenderness. There is no guarding or rebound.  Musculoskeletal:        General: No tenderness.     Cervical back: Normal range of motion and neck supple.  Lymphadenopathy:     Cervical: No cervical adenopathy.  Skin:    Findings: No erythema or rash.  Neurological:     Mental Status: She is disoriented.     Cranial Nerves: No cranial nerve deficit.     Motor: No abnormal muscle tone.     Coordination: Coordination abnormal.     Gait: Gait abnormal.     Deep Tendon Reflexes: Reflexes normal.  Psychiatric:        Mood and Affect: Mood normal.        Behavior: Behavior normal.        Thought Content: Thought content normal.     Lab Results  Component Value Date   WBC 7.0 03/11/2019   HGB 12.8 03/11/2019   HCT 38.8 03/11/2019   PLT 286 03/11/2019   GLUCOSE 119 (H) 03/11/2019   CHOL 203 (H) 06/26/2018   TRIG 196 (H) 06/26/2018   HDL 44 06/26/2018   LDLDIRECT 91.1 03/21/2010   LDLCALC 120 (H) 06/26/2018   ALT 16 06/25/2018   AST 19 06/25/2018   NA 144 03/11/2019   K 4.0 03/11/2019   CL 106 03/11/2019   CREATININE 0.94 03/11/2019   BUN 11 03/11/2019   CO2 22 03/11/2019   TSH 2.054 06/25/2018   INR 3.3 12/09/2012   HGBA1C 6.4 (H) 06/26/2018    CT Head Wo Contrast  Result Date: 06/25/2018 CLINICAL DATA:  Imbalance, forgetfulness. Expressive aphasia. EXAM: CT HEAD WITHOUT CONTRAST TECHNIQUE: Contiguous axial images were obtained from the base of the skull through the vertex without intravenous contrast. COMPARISON:  12/06/2012. FINDINGS: Brain: No evidence for acute infarction, hemorrhage, mass lesion, or extra-axial fluid. Generalized atrophy, not unexpected for age. Large  cortical infarcts affect the LEFT frontal and posterior frontal regions of the brain, with encephalomalacia and regional gliosis. Superimposed is extensive chronic microvascular ischemic change throughout the white matter. Hydrocephalus ex vacuo. Remote LEFT thalamic lacunar infarct could have been acute in 2014. There may be a chronic LEFT brainstem infarct at the lower midbrain/upper pontine level. Vascular: Calcification  of the cavernous internal carotid arteries consistent with cerebrovascular atherosclerotic disease. No signs of intracranial large vessel occlusion. Skull: Calvarium intact. Sinuses/Orbits: No acute findings. Other: None. IMPRESSION: Chronic changes as described. Remote LEFT hemisphere infarcts similar to priors. No acute intracranial findings. Electronically Signed   By: Staci Righter M.D.   On: 06/25/2018 15:19   MR BRAIN WO CONTRAST  Result Date: 06/26/2018 CLINICAL DATA:  Memory decline over the past 2 weeks. EXAM: MRI HEAD WITHOUT CONTRAST TECHNIQUE: Multiplanar, multiecho pulse sequences of the brain and surrounding structures were obtained without intravenous contrast. COMPARISON:  Head CT 06/25/2018 FINDINGS: Brain: There is no evidence of acute infarct, intracranial hemorrhage, mass, midline shift, or extra-axial fluid collection. Chronic moderate-sized infarcts are noted in the left frontal and left parietal lobes. Chronic lacunar infarcts are noted in the left thalamus and at the midbrain-pons junction just left of midline. Patchy to confluent T2 hyperintensities in the cerebral white matter bilaterally are nonspecific but compatible with moderate to severe chronic small vessel ischemic disease. There is moderate cerebral atrophy. Vascular: Major intracranial vascular flow voids are preserved. Skull and upper cervical spine: Unremarkable bone marrow signal. Sinuses/Orbits: Bilateral cataract extraction. Chronic left maxillary sinusitis. Trace mastoid effusions. Other: None.  IMPRESSION: 1. No acute intracranial abnormality. 2. Moderate to severe chronic small vessel ischemic disease with multiple chronic infarcts as above. Electronically Signed   By: Logan Bores M.D.   On: 06/26/2018 16:12   ECHOCARDIOGRAM COMPLETE  Result Date: 06/26/2018   ECHOCARDIOGRAM REPORT   Patient Name:   RANEEM MENDOLIA Date of Exam: 06/26/2018 Medical Rec #:  [id number]       Height:       63.0 in Accession #:    [id number]      Weight:       174.0 lb Date of Birth:  [birth year]       BSA:          1.82 m Patient Age:    54 years        BP:           172/69 mmHg Patient Gender: F               HR:           60 bpm. Exam Location:  Inpatient  Procedure: 2D Echo Indications:    Atrial Fibrillation 427.31  History:        Patient has prior history of Echocardiogram examinations, most                 recent 05/09/2017. CAD Defibrillator Risk Factors: Hypertension                 and Dyslipidemia.  Sonographer:    Mikki Santee RDCS (AE) Referring Phys: [id number] [place]  1. The left ventricle has normal systolic function, with an ejection fraction of 55-60%. The cavity size was normal. There is mildly increased left ventricular wall thickness. Left ventricular diastolic Doppler parameters are consistent with impaired relaxation. Elevated left ventricular end-diastolic pressure The E/e' is 15.5.  2. The right ventricle has normal systolic function. The cavity was normal. There is no increase in right ventricular wall thickness.  3. The mitral valve is abnormal. There is mild mitral annular calcification present.  4. Mild sclerosis of the aortic valve.  5. No intracardiac thrombi or masses were visualized.  6. When compared to the prior study: 05/09/2017 - no significant change has occured. Side by side comparison  of images performed. FINDINGS  Left Ventricle: The left ventricle has normal systolic function, with an ejection fraction of 55-60%. The cavity size was normal. There is mildly  increased left ventricular wall thickness. Left ventricular diastolic Doppler parameters are consistent with impaired relaxation. Elevated left ventricular end-diastolic pressure The E/e' is 15.5. Right Ventricle: The right ventricle has normal systolic function. The cavity was normal. There is no increase in right ventricular wall thickness. Pacing wire/catheter visualized in the right ventricle. Left Atrium: Left atrial size was normal in size. Right Atrium: Right atrial size was normal in size. Right atrial pressure is estimated at 3 mmHg. Interatrial Septum: No atrial level shunt detected by color flow Doppler. Pericardium: There is no evidence of pericardial effusion. Mitral Valve: The mitral valve is abnormal. There is mild mitral annular calcification present. Mitral valve regurgitation is mild by color flow Doppler. Tricuspid Valve: The tricuspid valve is normal in structure. Tricuspid valve regurgitation is trivial by color flow Doppler. Aortic Valve: The aortic valve is normal in structure. Mild sclerosis of the aortic valve. Aortic valve regurgitation was not visualized by color flow Doppler. Pulmonic Valve: The pulmonic valve was grossly normal. Pulmonic valve regurgitation is trivial by color flow Doppler. Venous: The inferior vena cava is normal in size with greater than 50% respiratory variability. Compared to previous exam: 05/09/2017 - no significant change has occured. Side by side comparison of images performed.  +--------------+--------++ LEFT VENTRICLE         +----------------+---------++ +--------------+--------++ Diastology                PLAX 2D                +----------------+---------++ +--------------+--------++ LV e' lateral:  3.15 cm/s LVIDd:        4.83 cm  +----------------+---------++ +--------------+--------++ LV E/e' lateral:15.1      LVIDs:        3.41 cm  +----------------+---------++ +--------------+--------++ LV e' medial:   3.07 cm/s LV PW:         1.20 cm  +----------------+---------++ +--------------+--------++ LV E/e' medial: 15.5      LV IVS:       1.21 cm  +----------------+---------++ +--------------+--------++ LVOT diam:    2.10 cm  +--------------+--------++ LV SV:        61 ml    +--------------+--------++ LV SV Index:  32.25    +--------------+--------++ LVOT Area:    3.46 cm +--------------+--------++                        +--------------+--------++ +---------------+---------++ RIGHT VENTRICLE          +---------------+---------++ RV S prime:    9.64 cm/s +---------------+---------++ TAPSE (M-mode):1.8 cm    +---------------+---------++ RVSP:          14.2 mmHg +---------------+---------++ +---------------+-------++-----------++ LEFT ATRIUM           Index       +---------------+-------++-----------++ LA diam:       3.00 cm1.65 cm/m  +---------------+-------++-----------++ LA Vol (A2C):  60.9 ml33.42 ml/m +---------------+-------++-----------++ LA Vol (A4C):  41.0 ml22.50 ml/m +---------------+-------++-----------++ LA Biplane Vol:50.8 ml27.87 ml/m +---------------+-------++-----------++ +------------+---------++----------++ RIGHT ATRIUM         Index      +------------+---------++----------++ RA Pressure:3.00 mmHg           +------------+---------++----------++ RA Area:    8.52 cm            +------------+---------++----------++ RA Volume:  13.80 ml 7.57 ml/m +------------+---------++----------++  +------------+-----------++  AORTIC VALVE            +------------+-----------++ LVOT Vmax:  75.70 cm/s  +------------+-----------++ LVOT Vmean: 51.900 cm/s +------------+-----------++ LVOT VTI:   0.162 m     +------------+-----------++  +-------------+-------++ AORTA                +-------------+-------++ Ao Root diam:3.30 cm +-------------+-------++ +--------------+----------++ +---------------+-----------++ MITRAL  VALVE             TRICUSPID VALVE            +--------------+----------++ +---------------+-----------++ MV Area (PHT):2.54 cm   TR Peak grad:  11.2 mmHg   +--------------+----------++ +---------------+-----------++ MV PHT:       86.71 msec TR Vmax:       167.00 cm/s +--------------+----------++ +---------------+-----------++ MV Decel Time:299 msec   Estimated RAP: 3.00 mmHg   +--------------+----------++ +---------------+-----------++ +-------------+-----------++ RVSP:          14.2 mmHg   MR Peak grad:96.4 mmHg   +---------------+-----------++ +-------------+-----------++ MR Vmax:     491.00 cm/s +--------------+-------+ +-------------+-----------++ SHUNTS                +--------------+----------++ +--------------+-------+ MV E velocity:47.60 cm/s Systemic VTI: 0.16 m  +--------------+----------++ +--------------+-------+ MV A velocity:79.30 cm/s Systemic Diam:2.10 cm +--------------+----------++ +--------------+-------+ MV E/A ratio: 0.60       +--------------+----------++  Cherlynn Kaiser MD Electronically signed by Cherlynn Kaiser MD Signature Date/Time: 06/26/2018/2:43:58 PM    Final    VAS US CAROTID (at [facility] and [facility] only)  Result Date: 06/26/2018 Carotid Arterial Duplex Study Indications:       CVA and balance problem. Risk Factors:      Hypertension, hyperlipidemia. Comparison Study:  no prior Performing Technologist: June Leap RDMS, RVT  Examination Guidelines: A complete evaluation includes B-mode imaging, spectral Doppler, color Doppler, and power Doppler as needed of all accessible portions of each vessel. Bilateral testing is considered an integral part of a complete examination. Limited examinations for reoccurring indications may be performed as noted.  Right Carotid Findings: +----------+--------+--------+--------+------------+--------+           PSV cm/sEDV cm/sStenosisDescribe    Comments  +----------+--------+--------+--------+------------+--------+ CCA Prox  51      9                                    +----------+--------+--------+--------+------------+--------+ CCA Distal62      14                                   +----------+--------+--------+--------+------------+--------+ ICA Prox  65      13      1-39%   heterogenous         +----------+--------+--------+--------+------------+--------+ ICA Distal62      21                                   +----------+--------+--------+--------+------------+--------+ ECA       66      10                                   +----------+--------+--------+--------+------------+--------+ +----------+--------+-------+----------------+-------------------+           PSV cm/sEDV cmsDescribe        Arm Pressure (mmHG) +----------+--------+-------+----------------+-------------------+ [id number]  Multiphasic, WNL                    +----------+--------+-------+----------------+-------------------+ +---------+--------+--+--------+-+---------+ VertebralPSV cm/s34EDV cm/s8Antegrade +---------+--------+--+--------+-+---------+  Left Carotid Findings: +----------+--------+--------+--------+------------+--------+           PSV cm/sEDV cm/sStenosisDescribe    Comments +----------+--------+--------+--------+------------+--------+ CCA Prox  56      11                                   +----------+--------+--------+--------+------------+--------+ CCA Distal69      12                                   +----------+--------+--------+--------+------------+--------+ ICA Prox  49      11      1-39%   heterogenous         +----------+--------+--------+--------+------------+--------+ ICA Distal62      11                                   +----------+--------+--------+--------+------------+--------+ ECA       57      4                                     +----------+--------+--------+--------+------------+--------+ +----------+--------+--------+----------------+-------------------+ SubclavianPSV cm/sEDV cm/sDescribe        Arm Pressure (mmHG) +----------+--------+--------+----------------+-------------------+           98              Multiphasic, WNL                    +----------+--------+--------+----------------+-------------------+ +---------+--------+--+--------+-+---------+ VertebralPSV cm/s25EDV cm/s5Antegrade +---------+--------+--+--------+-+---------+  Summary: Right Carotid: Velocities in the right ICA are consistent with a 1-39% stenosis. Left Carotid: Velocities in the left ICA are consistent with a 1-39% stenosis. Vertebrals:  Bilateral vertebral arteries demonstrate antegrade flow. Subclavians: Normal flow hemodynamics were seen in bilateral subclavian              arteries. *See table(s) above for measurements and observations.  Electronically signed by Antony Contras MD on 06/26/2018 at 12:48:03 PM.    Final     Assessment & Plan:    Walker Kehr, MD

## 2019-05-22 ENCOUNTER — Other Ambulatory Visit: Payer: Self-pay | Admitting: Internal Medicine

## 2019-05-22 MED ORDER — VITAMIN D3 1.25 MG (50000 UT) PO CAPS: 1.0000 | ORAL_CAPSULE | ORAL | 0 refills | Status: DC

## 2019-06-10 ENCOUNTER — Encounter: Payer: Self-pay | Admitting: Internal Medicine

## 2019-06-10 ENCOUNTER — Other Ambulatory Visit: Payer: Self-pay

## 2019-06-10 ENCOUNTER — Ambulatory Visit (INDEPENDENT_AMBULATORY_CARE_PROVIDER_SITE_OTHER): Payer: Medicare HMO | Admitting: Internal Medicine

## 2019-06-10 DIAGNOSIS — E538 Deficiency of other specified B group vitamins: Secondary | ICD-10-CM

## 2019-06-10 DIAGNOSIS — L309 Dermatitis, unspecified: Secondary | ICD-10-CM

## 2019-06-10 DIAGNOSIS — E559 Vitamin D deficiency, unspecified: Secondary | ICD-10-CM | POA: Diagnosis not present

## 2019-06-10 DIAGNOSIS — D485 Neoplasm of uncertain behavior of skin: Secondary | ICD-10-CM

## 2019-06-10 MED ORDER — DOXYCYCLINE HYCLATE 100 MG PO TABS: 100.0000 mg | ORAL_TABLET | Freq: Two times a day (BID) | ORAL | 0 refills | Status: DC

## 2019-06-10 NOTE — Patient Instructions (Signed)
Probable skin infection on the leg - take Doxycycline, use cream 4 times a day.  Probable skin cancer on the right arm - will biopsy in 1-2 weeks.  Use Vit D prescription  Inez Catalina, Please inform the patient 's son that all labs are stable, except for a low vitamin D level. I will email a prescription. After the prescription is finished please continue with daily vitamin D. Her diabetes is well controlled. Vitamin B12 is now elevated which is okay. Thanks, AP

## 2019-06-10 NOTE — Assessment & Plan Note (Signed)
R dist post arm lesion - skin bx  w/me

## 2019-06-10 NOTE — Progress Notes (Signed)
Subjective:  Patient ID: Alicia Guerrero, female    DOB: 10-Sep-1935  Age: 84 y.o. MRN: [id number]  CC: Rash (LLE and back of right arm)   HPI Alicia Guerrero presents for skin lesions, rash C/o a new lesion on R post arm C/o rash on the LLE - worse  Outpatient Medications Prior to Visit  Medication Sig Dispense Refill  . apixaban (ELIQUIS) 5 MG TABS tablet Take 1 tablet (5 mg total) by mouth 2 (two) times daily. 180 tablet 3  . atorvastatin (LIPITOR) 40 MG tablet TAKE 1 TABLET DAILY. PLEASEKEEP SCHEDULED APPOINTMENT WITH    YOUR DOCTOR FOR    REFILLS 90 tablet 3  . bisoprolol (ZEBETA) 5 MG tablet Take 1 tablet (5 mg total) by mouth 2 (two) times daily. 180 tablet 3  . Blood Glucose Monitoring Suppl (ONE TOUCH ULTRA 2) w/Device KIT Use to check blood sugars daily Dx e11.9 1 each 0  . cetirizine (ZYRTEC) 10 MG tablet Take 1 tablet (10 mg total) by mouth as needed for allergies. (Patient taking differently: Take 10 mg by mouth daily. ) 90 tablet 3  . Cholecalciferol (VITAMIN D3) 1.25 MG (50000 UT) CAPS Take 1 capsule by mouth once a week. 6 capsule 0  . Cholecalciferol (VITAMIN D3) 50 MCG (2000 UT) capsule Take 1 capsule (2,000 Units total) by mouth daily. 100 capsule 3  . Cyanocobalamin 1000 MCG SUBL Place 1 tablet (1,000 mcg total) under the tongue daily. 100 tablet 11  . glucose blood (ONE TOUCH ULTRA TEST) test strip 1 each by Other route daily as needed for other. Use to check blood sugars twice a day Dx E11.9 50 each 11  . Lancets (ONETOUCH ULTRASOFT) lancets 1 each by Other route 2 (two) times daily. Use to check blood sugars twice a day Dx E11.9 100 each 3  . metFORMIN (GLUCOPHAGE) 500 MG tablet Take 1 tablet (500 mg total) by mouth daily with breakfast. 90 tablet 3  . Multiple Vitamins-Minerals (PRESERVISION AREDS PO) Take 1 capsule by mouth 2 (two) times daily.    . nitroGLYCERIN (NITROSTAT) 0.4 MG SL tablet Place 1 tablet (0.4 mg total) under the tongue every 5 (five) minutes x 3  doses as needed for chest pain. Chest pain 20 tablet 3  . Respiratory Therapy Supplies (NEBULIZER) DEVI Use with solution Q6H PRN wheezing or shortness of breath 1 each 0  . spironolactone (ALDACTONE) 25 MG tablet Take 1 tablet (25 mg total) by mouth daily. 90 tablet 3  . telmisartan (MICARDIS) 40 MG tablet Take 1 tablet (40 mg total) by mouth daily. 90 tablet 3  . triamcinolone ointment (KENALOG) 0.5 % Apply 1 application topically 2 (two) times daily. On the leg rash 45 g 3   No facility-administered medications prior to visit.    ROS: Review of Systems  Constitutional: Negative for activity change, appetite change, chills, fatigue and unexpected weight change.  HENT: Negative for congestion, mouth sores and sinus pressure.   Eyes: Negative for visual disturbance.  Respiratory: Negative for cough and chest tightness.   Gastrointestinal: Negative for abdominal pain and nausea.  Genitourinary: Negative for difficulty urinating, frequency and vaginal pain.  Musculoskeletal: Negative for back pain and gait problem.  Skin: Positive for color change and rash. Negative for pallor.  Neurological: Negative for dizziness, tremors, weakness, numbness and headaches.  Psychiatric/Behavioral: Positive for confusion and decreased concentration. Negative for sleep disturbance. The patient is nervous/anxious.     Objective:  BP (!) 158/98 (BP  Location: Left Arm, Patient Position: Sitting, Cuff Size: Normal)   Pulse 64   Temp 98 F (36.7 C) (Oral)   Ht 5' 3"  (1.6 m)   Wt 161 lb (73 kg)   SpO2 96%   BMI 28.52 kg/m   BP Readings from Last 3 Encounters:  06/10/19 (!) 158/98  05/21/19 132/84  02/20/19 (!) 167/86    Wt Readings from Last 3 Encounters:  06/10/19 161 lb (73 kg)  05/21/19 158 lb (71.7 kg)  08/05/18 157 lb (71.2 kg)    Physical Exam Constitutional:      General: She is not in acute distress.    Appearance: She is well-developed. She is obese.  HENT:     Head: Normocephalic.      Right Ear: External ear normal.     Left Ear: External ear normal.     Nose: Nose normal.  Eyes:     General:        Right eye: No discharge.        Left eye: No discharge.     Conjunctiva/sclera: Conjunctivae normal.     Pupils: Pupils are equal, round, and reactive to light.  Neck:     Thyroid: No thyromegaly.     Vascular: No JVD.     Trachea: No tracheal deviation.  Cardiovascular:     Rate and Rhythm: Normal rate and regular rhythm.     Heart sounds: Normal heart sounds.  Pulmonary:     Effort: No respiratory distress.     Breath sounds: No stridor. No wheezing.  Abdominal:     General: Bowel sounds are normal. There is no distension.     Palpations: Abdomen is soft. There is no mass.     Tenderness: There is no abdominal tenderness. There is no guarding or rebound.  Musculoskeletal:        General: No tenderness.     Cervical back: Normal range of motion and neck supple.  Lymphadenopathy:     Cervical: No cervical adenopathy.  Skin:    Findings: Erythema, lesion and rash present.  Neurological:     Cranial Nerves: No cranial nerve deficit.     Motor: No abnormal muscle tone.     Coordination: Coordination normal.     Deep Tendon Reflexes: Reflexes normal.  Psychiatric:        Behavior: Behavior normal.        Thought Content: Thought content normal.        Judgment: Judgment normal.    R dist post arm lesion L distal pst lower leg erythema Lab Results  Component Value Date   WBC 7.0 03/11/2019   HGB 12.8 03/11/2019   HCT 38.8 03/11/2019   PLT 286 03/11/2019   GLUCOSE 111 (H) 05/21/2019   CHOL 203 (H) 06/26/2018   TRIG 196 (H) 06/26/2018   HDL 44 06/26/2018   LDLDIRECT 91.1 03/21/2010   LDLCALC 120 (H) 06/26/2018   ALT 14 05/21/2019   AST 18 05/21/2019   NA 141 05/21/2019   K 4.5 05/21/2019   CL 107 05/21/2019   CREATININE 1.00 05/21/2019   BUN 16 05/21/2019   CO2 28 05/21/2019   TSH 2.054 06/25/2018   INR 3.3 12/09/2012   HGBA1C 7.0 (H)  05/21/2019    CT Head Wo Contrast  Result Date: 06/25/2018 CLINICAL DATA:  Imbalance, forgetfulness. Expressive aphasia. EXAM: CT HEAD WITHOUT CONTRAST TECHNIQUE: Contiguous axial images were obtained from the base of the skull through the vertex without intravenous contrast.  COMPARISON:  12/06/2012. FINDINGS: Brain: No evidence for acute infarction, hemorrhage, mass lesion, or extra-axial fluid. Generalized atrophy, not unexpected for age. Large cortical infarcts affect the LEFT frontal and posterior frontal regions of the brain, with encephalomalacia and regional gliosis. Superimposed is extensive chronic microvascular ischemic change throughout the white matter. Hydrocephalus ex vacuo. Remote LEFT thalamic lacunar infarct could have been acute in 2014. There may be a chronic LEFT brainstem infarct at the lower midbrain/upper pontine level. Vascular: Calcification of the cavernous internal carotid arteries consistent with cerebrovascular atherosclerotic disease. No signs of intracranial large vessel occlusion. Skull: Calvarium intact. Sinuses/Orbits: No acute findings. Other: None. IMPRESSION: Chronic changes as described. Remote LEFT hemisphere infarcts similar to priors. No acute intracranial findings. Electronically Signed   By: Staci Righter M.D.   On: 06/25/2018 15:19   MR BRAIN WO CONTRAST  Result Date: 06/26/2018 CLINICAL DATA:  Memory decline over the past 2 weeks. EXAM: MRI HEAD WITHOUT CONTRAST TECHNIQUE: Multiplanar, multiecho pulse sequences of the brain and surrounding structures were obtained without intravenous contrast. COMPARISON:  Head CT 06/25/2018 FINDINGS: Brain: There is no evidence of acute infarct, intracranial hemorrhage, mass, midline shift, or extra-axial fluid collection. Chronic moderate-sized infarcts are noted in the left frontal and left parietal lobes. Chronic lacunar infarcts are noted in the left thalamus and at the midbrain-pons junction just left of midline. Patchy to  confluent T2 hyperintensities in the cerebral white matter bilaterally are nonspecific but compatible with moderate to severe chronic small vessel ischemic disease. There is moderate cerebral atrophy. Vascular: Major intracranial vascular flow voids are preserved. Skull and upper cervical spine: Unremarkable bone marrow signal. Sinuses/Orbits: Bilateral cataract extraction. Chronic left maxillary sinusitis. Trace mastoid effusions. Other: None. IMPRESSION: 1. No acute intracranial abnormality. 2. Moderate to severe chronic small vessel ischemic disease with multiple chronic infarcts as above. Electronically Signed   By: Logan Bores M.D.   On: 06/26/2018 16:12   ECHOCARDIOGRAM COMPLETE  Result Date: 06/26/2018   ECHOCARDIOGRAM REPORT   Patient Name:   Alicia Guerrero Date of Exam: 06/26/2018 Medical Rec #:  [id number]       Height:       63.0 in Accession #:    [id number]      Weight:       174.0 lb Date of Birth:  20-Aug-1935       BSA:          1.82 m Patient Age:    84 years        BP:           172/69 mmHg Patient Gender: F               HR:           60 bpm. Exam Location:  Inpatient  Procedure: 2D Echo Indications:    Atrial Fibrillation 427.31  History:        Patient has prior history of Echocardiogram examinations, most                 recent 05/09/2017. CAD Defibrillator Risk Factors: Hypertension                 and Dyslipidemia.  Sonographer:    Mikki Santee RDCS (AE) Referring Phys: [phone] [place]  1. The left ventricle has normal systolic function, with an ejection fraction of 55-60%. The cavity size was normal. There is mildly increased left ventricular wall thickness. Left ventricular diastolic Doppler parameters are consistent with impaired  relaxation. Elevated left ventricular end-diastolic pressure The E/e' is 15.5.  2. The right ventricle has normal systolic function. The cavity was normal. There is no increase in right ventricular wall thickness.  3. The mitral valve  is abnormal. There is mild mitral annular calcification present.  4. Mild sclerosis of the aortic valve.  5. No intracardiac thrombi or masses were visualized.  6. When compared to the prior study: 05/09/2017 - no significant change has occured. Side by side comparison of images performed. FINDINGS  Left Ventricle: The left ventricle has normal systolic function, with an ejection fraction of 55-60%. The cavity size was normal. There is mildly increased left ventricular wall thickness. Left ventricular diastolic Doppler parameters are consistent with impaired relaxation. Elevated left ventricular end-diastolic pressure The E/e' is 15.5. Right Ventricle: The right ventricle has normal systolic function. The cavity was normal. There is no increase in right ventricular wall thickness. Pacing wire/catheter visualized in the right ventricle. Left Atrium: Left atrial size was normal in size. Right Atrium: Right atrial size was normal in size. Right atrial pressure is estimated at 3 mmHg. Interatrial Septum: No atrial level shunt detected by color flow Doppler. Pericardium: There is no evidence of pericardial effusion. Mitral Valve: The mitral valve is abnormal. There is mild mitral annular calcification present. Mitral valve regurgitation is mild by color flow Doppler. Tricuspid Valve: The tricuspid valve is normal in structure. Tricuspid valve regurgitation is trivial by color flow Doppler. Aortic Valve: The aortic valve is normal in structure. Mild sclerosis of the aortic valve. Aortic valve regurgitation was not visualized by color flow Doppler. Pulmonic Valve: The pulmonic valve was grossly normal. Pulmonic valve regurgitation is trivial by color flow Doppler. Venous: The inferior vena cava is normal in size with greater than 50% respiratory variability. Compared to previous exam: 05/09/2017 - no significant change has occured. Side by side comparison of images performed.  +--------------+--------++ LEFT VENTRICLE          +----------------+---------++ +--------------+--------++ Diastology                PLAX 2D                +----------------+---------++ +--------------+--------++ LV e' lateral:  3.15 cm/s LVIDd:        4.83 cm  +----------------+---------++ +--------------+--------++ LV E/e' lateral:15.1      LVIDs:        3.41 cm  +----------------+---------++ +--------------+--------++ LV e' medial:   3.07 cm/s LV PW:        1.20 cm  +----------------+---------++ +--------------+--------++ LV E/e' medial: 15.5      LV IVS:       1.21 cm  +----------------+---------++ +--------------+--------++ LVOT diam:    2.10 cm  +--------------+--------++ LV SV:        61 ml    +--------------+--------++ LV SV Index:  32.25    +--------------+--------++ LVOT Area:    3.46 cm +--------------+--------++                        +--------------+--------++ +---------------+---------++ RIGHT VENTRICLE          +---------------+---------++ RV S prime:    9.64 cm/s +---------------+---------++ TAPSE (M-mode):1.8 cm    +---------------+---------++ RVSP:          14.2 mmHg +---------------+---------++ +---------------+-------++-----------++ LEFT ATRIUM           Index       +---------------+-------++-----------++ LA diam:       3.00 cm1.65 cm/m  +---------------+-------++-----------++ LA  Vol (A2C):  60.9 ml33.42 ml/m +---------------+-------++-----------++ LA Vol (A4C):  41.0 ml22.50 ml/m +---------------+-------++-----------++ LA Biplane Vol:50.8 ml27.87 ml/m +---------------+-------++-----------++ +------------+---------++----------++ RIGHT ATRIUM         Index      +------------+---------++----------++ RA Pressure:3.00 mmHg           +------------+---------++----------++ RA Area:    8.52 cm            +------------+---------++----------++ RA Volume:  13.80 ml 7.57 ml/m +------------+---------++----------++   +------------+-----------++ AORTIC VALVE            +------------+-----------++ LVOT Vmax:  75.70 cm/s  +------------+-----------++ LVOT Vmean: 51.900 cm/s +------------+-----------++ LVOT VTI:   0.162 m     +------------+-----------++  +-------------+-------++ AORTA                +-------------+-------++ Ao Root diam:3.30 cm +-------------+-------++ +--------------+----------++ +---------------+-----------++ MITRAL VALVE             TRICUSPID VALVE            +--------------+----------++ +---------------+-----------++ MV Area (PHT):2.54 cm   TR Peak grad:  11.2 mmHg   +--------------+----------++ +---------------+-----------++ MV PHT:       86.71 msec TR Vmax:       167.00 cm/s +--------------+----------++ +---------------+-----------++ MV Decel Time:299 msec   Estimated RAP: 3.00 mmHg   +--------------+----------++ +---------------+-----------++ +-------------+-----------++ RVSP:          14.2 mmHg   MR Peak grad:96.4 mmHg   +---------------+-----------++ +-------------+-----------++ MR Vmax:     491.00 cm/s +--------------+-------+ +-------------+-----------++ SHUNTS                +--------------+----------++ +--------------+-------+ MV E velocity:47.60 cm/s Systemic VTI: 0.16 m  +--------------+----------++ +--------------+-------+ MV A velocity:79.30 cm/s Systemic Diam:2.10 cm +--------------+----------++ +--------------+-------+ MV E/A ratio: 0.60       +--------------+----------++  Cherlynn Kaiser MD Electronically signed by Cherlynn Kaiser MD Signature Date/Time: 06/26/2018/2:43:58 PM    Final    VAS US CAROTID (at [facility] only)  Result Date: 06/26/2018 Carotid Arterial Duplex Study Indications:       CVA and balance problem. Risk Factors:      Hypertension, hyperlipidemia. Comparison Study:  no prior Performing Technologist: June Leap RDMS, RVT  Examination Guidelines: A complete evaluation includes B-mode  imaging, spectral Doppler, color Doppler, and power Doppler as needed of all accessible portions of each vessel. Bilateral testing is considered an integral part of a complete examination. Limited examinations for reoccurring indications may be performed as noted.  Right Carotid Findings: +----------+--------+--------+--------+------------+--------+           PSV cm/sEDV cm/sStenosisDescribe    Comments +----------+--------+--------+--------+------------+--------+ CCA Prox  51      9                                    +----------+--------+--------+--------+------------+--------+ CCA Distal62      14                                   +----------+--------+--------+--------+------------+--------+ ICA Prox  65      13      1-39%   heterogenous         +----------+--------+--------+--------+------------+--------+ ICA Distal62      21                                   +----------+--------+--------+--------+------------+--------+  ECA       66      10                                   +----------+--------+--------+--------+------------+--------+ +----------+--------+-------+----------------+-------------------+           PSV cm/sEDV cmsDescribe        Arm Pressure (mmHG) +----------+--------+-------+----------------+-------------------+ [id number]             Multiphasic, WNL                    +----------+--------+-------+----------------+-------------------+ +---------+--------+--+--------+-+---------+ VertebralPSV cm/s34EDV cm/s8Antegrade +---------+--------+--+--------+-+---------+  Left Carotid Findings: +----------+--------+--------+--------+------------+--------+           PSV cm/sEDV cm/sStenosisDescribe    Comments +----------+--------+--------+--------+------------+--------+ CCA Prox  56      11                                   +----------+--------+--------+--------+------------+--------+ CCA Distal69      12                                    +----------+--------+--------+--------+------------+--------+ ICA Prox  49      11      1-39%   heterogenous         +----------+--------+--------+--------+------------+--------+ ICA Distal62      11                                   +----------+--------+--------+--------+------------+--------+ ECA       57      4                                    +----------+--------+--------+--------+------------+--------+ +----------+--------+--------+----------------+-------------------+ SubclavianPSV cm/sEDV cm/sDescribe        Arm Pressure (mmHG) +----------+--------+--------+----------------+-------------------+           98              Multiphasic, WNL                    +----------+--------+--------+----------------+-------------------+ +---------+--------+--+--------+-+---------+ VertebralPSV cm/s25EDV cm/s5Antegrade +---------+--------+--+--------+-+---------+  Summary: Right Carotid: Velocities in the right ICA are consistent with a 1-39% stenosis. Left Carotid: Velocities in the left ICA are consistent with a 1-39% stenosis. Vertebrals:  Bilateral vertebral arteries demonstrate antegrade flow. Subclavians: Normal flow hemodynamics were seen in bilateral subclavian              arteries. *See table(s) above for measurements and observations.  Electronically signed by Antony Contras MD on 06/26/2018 at 12:48:03 PM.    Final     Assessment & Plan:    Walker Kehr, MD

## 2019-06-10 NOTE — Assessment & Plan Note (Signed)
Worse - Triamc oint qid Doxy x 7 d for proobable cellulitis

## 2019-06-10 NOTE — Assessment & Plan Note (Signed)
Risks associated with treatment noncompliance were discussed. Compliance was encouraged. On B12

## 2019-06-10 NOTE — Assessment & Plan Note (Signed)
start Vit D prescription 50000 iu weekly (Rx emailed to your pharmacy) followed by over-the-counter Vit D 2000 iu daily.  

## 2019-06-17 ENCOUNTER — Ambulatory Visit (INDEPENDENT_AMBULATORY_CARE_PROVIDER_SITE_OTHER): Payer: Medicare HMO | Admitting: *Deleted

## 2019-06-17 DIAGNOSIS — I255 Ischemic cardiomyopathy: Secondary | ICD-10-CM | POA: Diagnosis not present

## 2019-06-17 LAB — CUP PACEART REMOTE DEVICE CHECK
Battery Remaining Longevity: 31 mo
Battery Remaining Percentage: 56 %
Battery Voltage: 2.93 V
Brady Statistic AP VP Percent: 94 %
Brady Statistic AP VS Percent: 1 %
Brady Statistic AS VP Percent: 3.2 %
Brady Statistic AS VS Percent: 1 %
Brady Statistic RA Percent Paced: 94 %
Date Time Interrogation Session: 20210531020017
HighPow Impedance: 63 Ohm
HighPow Impedance: 63 Ohm
Implantable Lead Implant Date: 20131017
Implantable Lead Implant Date: 20131017
Implantable Lead Implant Date: 20131017
Implantable Lead Location: 753858
Implantable Lead Location: 753859
Implantable Lead Location: 753860
Implantable Pulse Generator Implant Date: 20190426
Lead Channel Impedance Value: 350 Ohm
Lead Channel Impedance Value: 380 Ohm
Lead Channel Impedance Value: 760 Ohm
Lead Channel Pacing Threshold Amplitude: 0.875 V
Lead Channel Pacing Threshold Amplitude: 1.125 V
Lead Channel Pacing Threshold Amplitude: 3 V
Lead Channel Pacing Threshold Pulse Width: 0.5 ms
Lead Channel Pacing Threshold Pulse Width: 0.5 ms
Lead Channel Pacing Threshold Pulse Width: 1.5 ms
Lead Channel Sensing Intrinsic Amplitude: 12 mV
Lead Channel Sensing Intrinsic Amplitude: 2.5 mV
Lead Channel Setting Pacing Amplitude: 2 V
Lead Channel Setting Pacing Amplitude: 2.125
Lead Channel Setting Pacing Amplitude: 3.5 V
Lead Channel Setting Pacing Pulse Width: 0.5 ms
Lead Channel Setting Pacing Pulse Width: 1.5 ms
Lead Channel Setting Sensing Sensitivity: 0.5 mV
Pulse Gen Serial Number: 9820905

## 2019-06-17 NOTE — Progress Notes (Signed)
Remote ICD transmission.   

## 2019-07-11 ENCOUNTER — Telehealth: Payer: Self-pay

## 2019-07-11 NOTE — Telephone Encounter (Deleted)
Error

## 2019-07-31 ENCOUNTER — Encounter: Payer: Self-pay | Admitting: Internal Medicine

## 2019-07-31 ENCOUNTER — Other Ambulatory Visit: Payer: Self-pay

## 2019-07-31 ENCOUNTER — Ambulatory Visit (INDEPENDENT_AMBULATORY_CARE_PROVIDER_SITE_OTHER): Payer: Medicare HMO | Admitting: Internal Medicine

## 2019-07-31 VITALS — BP 156/88 | HR 65 | Temp 98.8°F | Ht 63.0 in | Wt 160.0 lb

## 2019-07-31 DIAGNOSIS — D489 Neoplasm of uncertain behavior, unspecified: Secondary | ICD-10-CM

## 2019-07-31 DIAGNOSIS — L309 Dermatitis, unspecified: Secondary | ICD-10-CM

## 2019-07-31 DIAGNOSIS — D0461 Carcinoma in situ of skin of right upper limb, including shoulder: Secondary | ICD-10-CM

## 2019-07-31 DIAGNOSIS — I10 Essential (primary) hypertension: Secondary | ICD-10-CM | POA: Diagnosis not present

## 2019-07-31 DIAGNOSIS — D485 Neoplasm of uncertain behavior of skin: Secondary | ICD-10-CM

## 2019-07-31 MED ORDER — TRIAMCINOLONE ACETONIDE 0.5 % EX OINT: 1.0000 "application " | TOPICAL_OINTMENT | Freq: Two times a day (BID) | CUTANEOUS | 3 refills | Status: DC

## 2019-07-31 NOTE — Assessment & Plan Note (Signed)
BP Readings from Last 3 Encounters:  07/31/19 (!) 156/88  06/10/19 (!) 158/98  05/21/19 132/84   Cont w/current Rx

## 2019-07-31 NOTE — Patient Instructions (Signed)
Postprocedure instructions :    A Band-Aid should be  changed twice daily. You can take a shower tomorrow.  Keep the wounds clean. You can wash them with liquid soap and water. Pat dry with gauze or a Kleenex tissue  Before applying antibiotic ointment and a Band-Aid.   You need to report immediately  if fever, chills or any signs of infection develop.    The biopsy results should be available in 1 -2 weeks. 

## 2019-07-31 NOTE — Assessment & Plan Note (Signed)
Cont w/ Triamc oint

## 2019-07-31 NOTE — Assessment & Plan Note (Signed)
See bx procedure

## 2019-07-31 NOTE — Progress Notes (Signed)
Subjective:  Patient ID: Alicia Guerrero, female    DOB: 27-Sep-1935  Age: 84 y.o. MRN: [id number]  CC: No chief complaint on file.   HPI Khamani Daniely presents for skin bx  C/o rash on leg - some better. F/u HTN      Procedure Note :     Procedure :  Skin biopsy   Indication:  Changing mole (s ),  Suspicious lesion(s)   Risks including unsuccessful procedure , bleeding, infection, bruising, scar, a need for another complete procedure and others were explained to the patient in detail as well as the benefits. Informed consent was obtained.  The patient was placed in a decubitus position.  Lesion #1 on R posterior dist arm measuring 12x11 mm   Skin over lesion #1  was prepped with Betadine and alcohol  and anesthetized with 2 cc of 2% lidocaine and epinephrine, using a 25-gauge 1 inch needle.  Shave biopsy with a sterile Dermablade was carried out in the usual fashion. Hyfrecator was used to destroy the rest of the lesion potentially left behind and for hemostasis. Band-Aid was applied with antibiotic ointment.      Outpatient Medications Prior to Visit  Medication Sig Dispense Refill  . apixaban (ELIQUIS) 5 MG TABS tablet Take 1 tablet (5 mg total) by mouth 2 (two) times daily. 180 tablet 3  . atorvastatin (LIPITOR) 40 MG tablet TAKE 1 TABLET DAILY. PLEASEKEEP SCHEDULED APPOINTMENT WITH    YOUR DOCTOR FOR    REFILLS 90 tablet 3  . bisoprolol (ZEBETA) 5 MG tablet Take 1 tablet (5 mg total) by mouth 2 (two) times daily. 180 tablet 3  . Blood Glucose Monitoring Suppl (ONE TOUCH ULTRA 2) w/Device KIT Use to check blood sugars daily Dx e11.9 1 each 0  . cetirizine (ZYRTEC) 10 MG tablet Take 1 tablet (10 mg total) by mouth as needed for allergies. (Patient taking differently: Take 10 mg by mouth daily. ) 90 tablet 3  . Cholecalciferol (VITAMIN D3) 1.25 MG (50000 UT) CAPS Take 1 capsule by mouth once a week. 6 capsule 0  . Cholecalciferol (VITAMIN D3) 50 MCG (2000 UT) capsule Take  1 capsule (2,000 Units total) by mouth daily. 100 capsule 3  . Cyanocobalamin 1000 MCG SUBL Place 1 tablet (1,000 mcg total) under the tongue daily. 100 tablet 11  . doxycycline (VIBRA-TABS) 100 MG tablet Take 1 tablet (100 mg total) by mouth 2 (two) times daily. 14 tablet 0  . glucose blood (ONE TOUCH ULTRA TEST) test strip 1 each by Other route daily as needed for other. Use to check blood sugars twice a day Dx E11.9 50 each 11  . Lancets (ONETOUCH ULTRASOFT) lancets 1 each by Other route 2 (two) times daily. Use to check blood sugars twice a day Dx E11.9 100 each 3  . metFORMIN (GLUCOPHAGE) 500 MG tablet Take 1 tablet (500 mg total) by mouth daily with breakfast. 90 tablet 3  . Multiple Vitamins-Minerals (PRESERVISION AREDS PO) Take 1 capsule by mouth 2 (two) times daily.    . nitroGLYCERIN (NITROSTAT) 0.4 MG SL tablet Place 1 tablet (0.4 mg total) under the tongue every 5 (five) minutes x 3 doses as needed for chest pain. Chest pain 20 tablet 3  . Respiratory Therapy Supplies (NEBULIZER) DEVI Use with solution Q6H PRN wheezing or shortness of breath 1 each 0  . spironolactone (ALDACTONE) 25 MG tablet Take 1 tablet (25 mg total) by mouth daily. 90 tablet 3  . telmisartan (  MICARDIS) 40 MG tablet Take 1 tablet (40 mg total) by mouth daily. 90 tablet 3  . triamcinolone ointment (KENALOG) 0.5 % Apply 1 application topically 2 (two) times daily. On the leg rash 45 g 3   No facility-administered medications prior to visit.    ROS: Review of Systems  Constitutional: Positive for fatigue.  Respiratory: Negative for shortness of breath.   Cardiovascular: Positive for leg swelling.  Skin: Positive for color change and rash.  Psychiatric/Behavioral: Positive for decreased concentration.    Objective:  BP (!) 156/88 (BP Location: Left Arm, Patient Position: Sitting, Cuff Size: Large)   Pulse 65   Temp 98.8 F (37.1 C) (Oral)   Ht 5' 3"  (1.6 m)   Wt 160 lb (72.6 kg)   SpO2 97%   BMI 28.34  kg/m   BP Readings from Last 3 Encounters:  07/31/19 (!) 156/88  06/10/19 (!) 158/98  05/21/19 132/84    Wt Readings from Last 3 Encounters:  07/31/19 160 lb (72.6 kg)  06/10/19 161 lb (73 kg)  05/21/19 158 lb (71.7 kg)    Physical Exam Constitutional:      General: She is not in acute distress.    Appearance: She is well-developed.  HENT:     Head: Normocephalic.     Right Ear: External ear normal.     Left Ear: External ear normal.     Nose: Nose normal.  Eyes:     General:        Right eye: No discharge.        Left eye: No discharge.     Conjunctiva/sclera: Conjunctivae normal.     Pupils: Pupils are equal, round, and reactive to light.  Neck:     Thyroid: No thyromegaly.     Vascular: No JVD.     Trachea: No tracheal deviation.  Cardiovascular:     Rate and Rhythm: Normal rate and regular rhythm.     Heart sounds: Normal heart sounds.  Pulmonary:     Effort: No respiratory distress.     Breath sounds: No stridor. No wheezing.  Abdominal:     General: Bowel sounds are normal. There is no distension.     Palpations: Abdomen is soft. There is no mass.     Tenderness: There is no abdominal tenderness. There is no guarding or rebound.  Musculoskeletal:        General: No tenderness.     Cervical back: Normal range of motion and neck supple.  Lymphadenopathy:     Cervical: No cervical adenopathy.  Skin:    Findings: Erythema, lesion and rash present.  Neurological:     Cranial Nerves: No cranial nerve deficit.     Motor: No abnormal muscle tone.     Coordination: Coordination normal.     Deep Tendon Reflexes: Reflexes normal.  Psychiatric:        Behavior: Behavior normal.        Thought Content: Thought content normal.        Judgment: Judgment normal.    R dist shin w/eczema type rash   Lab Results  Component Value Date   WBC 7.0 03/11/2019   HGB 12.8 03/11/2019   HCT 38.8 03/11/2019   PLT 286 03/11/2019   GLUCOSE 111 (H) 05/21/2019   CHOL 203  (H) 06/26/2018   TRIG 196 (H) 06/26/2018   HDL 44 06/26/2018   LDLDIRECT 91.1 03/21/2010   LDLCALC 120 (H) 06/26/2018   ALT 14 05/21/2019   AST  18 05/21/2019   NA 141 05/21/2019   K 4.5 05/21/2019   CL 107 05/21/2019   CREATININE 1.00 05/21/2019   BUN 16 05/21/2019   CO2 28 05/21/2019   TSH 2.054 06/25/2018   INR 3.3 12/09/2012   HGBA1C 7.0 (H) 05/21/2019    CT Head Wo Contrast  Result Date: 06/25/2018 CLINICAL DATA:  Imbalance, forgetfulness. Expressive aphasia. EXAM: CT HEAD WITHOUT CONTRAST TECHNIQUE: Contiguous axial images were obtained from the base of the skull through the vertex without intravenous contrast. COMPARISON:  12/06/2012. FINDINGS: Brain: No evidence for acute infarction, hemorrhage, mass lesion, or extra-axial fluid. Generalized atrophy, not unexpected for age. Large cortical infarcts affect the LEFT frontal and posterior frontal regions of the brain, with encephalomalacia and regional gliosis. Superimposed is extensive chronic microvascular ischemic change throughout the white matter. Hydrocephalus ex vacuo. Remote LEFT thalamic lacunar infarct could have been acute in 2014. There may be a chronic LEFT brainstem infarct at the lower midbrain/upper pontine level. Vascular: Calcification of the cavernous internal carotid arteries consistent with cerebrovascular atherosclerotic disease. No signs of intracranial large vessel occlusion. Skull: Calvarium intact. Sinuses/Orbits: No acute findings. Other: None. IMPRESSION: Chronic changes as described. Remote LEFT hemisphere infarcts similar to priors. No acute intracranial findings. Electronically Signed   By: Staci Righter M.D.   On: 06/25/2018 15:19   MR BRAIN WO CONTRAST  Result Date: 06/26/2018 CLINICAL DATA:  Memory decline over the past 2 weeks. EXAM: MRI HEAD WITHOUT CONTRAST TECHNIQUE: Multiplanar, multiecho pulse sequences of the brain and surrounding structures were obtained without intravenous contrast. COMPARISON:   Head CT 06/25/2018 FINDINGS: Brain: There is no evidence of acute infarct, intracranial hemorrhage, mass, midline shift, or extra-axial fluid collection. Chronic moderate-sized infarcts are noted in the left frontal and left parietal lobes. Chronic lacunar infarcts are noted in the left thalamus and at the midbrain-pons junction just left of midline. Patchy to confluent T2 hyperintensities in the cerebral white matter bilaterally are nonspecific but compatible with moderate to severe chronic small vessel ischemic disease. There is moderate cerebral atrophy. Vascular: Major intracranial vascular flow voids are preserved. Skull and upper cervical spine: Unremarkable bone marrow signal. Sinuses/Orbits: Bilateral cataract extraction. Chronic left maxillary sinusitis. Trace mastoid effusions. Other: None. IMPRESSION: 1. No acute intracranial abnormality. 2. Moderate to severe chronic small vessel ischemic disease with multiple chronic infarcts as above. Electronically Signed   By: Logan Bores M.D.   On: 06/26/2018 16:12   ECHOCARDIOGRAM COMPLETE  Result Date: 06/26/2018   ECHOCARDIOGRAM REPORT   Patient Name:   GLENNA BRUNKOW Date of Exam: 06/26/2018 Medical Rec #:  [id number]       Height:       63.0 in Accession #:    [id number]      Weight:       174.0 lb Date of Birth:  19-Nov-1935       BSA:          1.82 m Patient Age:    78 years        BP:           172/69 mmHg Patient Gender: F               HR:           60 bpm. Exam Location:  Inpatient  Procedure: 2D Echo Indications:    Atrial Fibrillation 427.31  History:        Patient has prior history of Echocardiogram examinations, most  recent 05/09/2017. CAD Defibrillator Risk Factors: Hypertension                 and Dyslipidemia.  Sonographer:    Mikki Santee RDCS (AE) Referring Phys: [phone] [place]  1. The left ventricle has normal systolic function, with an ejection fraction of 55-60%. The cavity size was normal. There  is mildly increased left ventricular wall thickness. Left ventricular diastolic Doppler parameters are consistent with impaired relaxation. Elevated left ventricular end-diastolic pressure The E/e' is 15.5.  2. The right ventricle has normal systolic function. The cavity was normal. There is no increase in right ventricular wall thickness.  3. The mitral valve is abnormal. There is mild mitral annular calcification present.  4. Mild sclerosis of the aortic valve.  5. No intracardiac thrombi or masses were visualized.  6. When compared to the prior study: 05/09/2017 - no significant change has occured. Side by side comparison of images performed. FINDINGS  Left Ventricle: The left ventricle has normal systolic function, with an ejection fraction of 55-60%. The cavity size was normal. There is mildly increased left ventricular wall thickness. Left ventricular diastolic Doppler parameters are consistent with impaired relaxation. Elevated left ventricular end-diastolic pressure The E/e' is 15.5. Right Ventricle: The right ventricle has normal systolic function. The cavity was normal. There is no increase in right ventricular wall thickness. Pacing wire/catheter visualized in the right ventricle. Left Atrium: Left atrial size was normal in size. Right Atrium: Right atrial size was normal in size. Right atrial pressure is estimated at 3 mmHg. Interatrial Septum: No atrial level shunt detected by color flow Doppler. Pericardium: There is no evidence of pericardial effusion. Mitral Valve: The mitral valve is abnormal. There is mild mitral annular calcification present. Mitral valve regurgitation is mild by color flow Doppler. Tricuspid Valve: The tricuspid valve is normal in structure. Tricuspid valve regurgitation is trivial by color flow Doppler. Aortic Valve: The aortic valve is normal in structure. Mild sclerosis of the aortic valve. Aortic valve regurgitation was not visualized by color flow Doppler. Pulmonic Valve: The  pulmonic valve was grossly normal. Pulmonic valve regurgitation is trivial by color flow Doppler. Venous: The inferior vena cava is normal in size with greater than 50% respiratory variability. Compared to previous exam: 05/09/2017 - no significant change has occured. Side by side comparison of images performed.  +--------------+--------++ LEFT VENTRICLE         +----------------+---------++ +--------------+--------++ Diastology                PLAX 2D                +----------------+---------++ +--------------+--------++ LV e' lateral:  3.15 cm/s LVIDd:        4.83 cm  +----------------+---------++ +--------------+--------++ LV E/e' lateral:15.1      LVIDs:        3.41 cm  +----------------+---------++ +--------------+--------++ LV e' medial:   3.07 cm/s LV PW:        1.20 cm  +----------------+---------++ +--------------+--------++ LV E/e' medial: 15.5      LV IVS:       1.21 cm  +----------------+---------++ +--------------+--------++ LVOT diam:    2.10 cm  +--------------+--------++ LV SV:        61 ml    +--------------+--------++ LV SV Index:  32.25    +--------------+--------++ LVOT Area:    3.46 cm +--------------+--------++                        +--------------+--------++ +---------------+---------++  RIGHT VENTRICLE          +---------------+---------++ RV S prime:    9.64 cm/s +---------------+---------++ TAPSE (M-mode):1.8 cm    +---------------+---------++ RVSP:          14.2 mmHg +---------------+---------++ +---------------+-------++-----------++ LEFT ATRIUM           Index       +---------------+-------++-----------++ LA diam:       3.00 cm1.65 cm/m  +---------------+-------++-----------++ LA Vol (A2C):  60.9 ml33.42 ml/m +---------------+-------++-----------++ LA Vol (A4C):  41.0 ml22.50 ml/m +---------------+-------++-----------++ LA Biplane Vol:50.8 ml27.87 ml/m  +---------------+-------++-----------++ +------------+---------++----------++ RIGHT ATRIUM         Index      +------------+---------++----------++ RA Pressure:3.00 mmHg           +------------+---------++----------++ RA Area:    8.52 cm            +------------+---------++----------++ RA Volume:  13.80 ml 7.57 ml/m +------------+---------++----------++  +------------+-----------++ AORTIC VALVE            +------------+-----------++ LVOT Vmax:  75.70 cm/s  +------------+-----------++ LVOT Vmean: 51.900 cm/s +------------+-----------++ LVOT VTI:   0.162 m     +------------+-----------++  +-------------+-------++ AORTA                +-------------+-------++ Ao Root diam:3.30 cm +-------------+-------++ +--------------+----------++ +---------------+-----------++ MITRAL VALVE             TRICUSPID VALVE            +--------------+----------++ +---------------+-----------++ MV Area (PHT):2.54 cm   TR Peak grad:  11.2 mmHg   +--------------+----------++ +---------------+-----------++ MV PHT:       86.71 msec TR Vmax:       167.00 cm/s +--------------+----------++ +---------------+-----------++ MV Decel Time:299 msec   Estimated RAP: 3.00 mmHg   +--------------+----------++ +---------------+-----------++ +-------------+-----------++ RVSP:          14.2 mmHg   MR Peak grad:96.4 mmHg   +---------------+-----------++ +-------------+-----------++ MR Vmax:     491.00 cm/s +--------------+-------+ +-------------+-----------++ SHUNTS                +--------------+----------++ +--------------+-------+ MV E velocity:47.60 cm/s Systemic VTI: 0.16 m  +--------------+----------++ +--------------+-------+ MV A velocity:79.30 cm/s Systemic Diam:2.10 cm +--------------+----------++ +--------------+-------+ MV E/A ratio: 0.60       +--------------+----------++  Cherlynn Kaiser MD Electronically signed by Cherlynn Kaiser MD  Signature Date/Time: 06/26/2018/2:43:58 PM    Final    VAS US CAROTID (at [facility] and [facility] only)  Result Date: 06/26/2018 Carotid Arterial Duplex Study Indications:       CVA and balance problem. Risk Factors:      Hypertension, hyperlipidemia. Comparison Study:  no prior Performing Technologist: June Leap RDMS, RVT  Examination Guidelines: A complete evaluation includes B-mode imaging, spectral Doppler, color Doppler, and power Doppler as needed of all accessible portions of each vessel. Bilateral testing is considered an integral part of a complete examination. Limited examinations for reoccurring indications may be performed as noted.  Right Carotid Findings: +----------+--------+--------+--------+------------+--------+           PSV cm/sEDV cm/sStenosisDescribe    Comments +----------+--------+--------+--------+------------+--------+ CCA Prox  51      9                                    +----------+--------+--------+--------+------------+--------+ CCA Distal62      14                                   +----------+--------+--------+--------+------------+--------+  ICA Prox  65      13      1-39%   heterogenous         +----------+--------+--------+--------+------------+--------+ ICA Distal62      21                                   +----------+--------+--------+--------+------------+--------+ ECA       66      10                                   +----------+--------+--------+--------+------------+--------+ +----------+--------+-------+----------------+-------------------+           PSV cm/sEDV cmsDescribe        Arm Pressure (mmHG) +----------+--------+-------+----------------+-------------------+ [id number]             Multiphasic, WNL                    +----------+--------+-------+----------------+-------------------+ +---------+--------+--+--------+-+---------+ VertebralPSV cm/s34EDV cm/s8Antegrade +---------+--------+--+--------+-+---------+   Left Carotid Findings: +----------+--------+--------+--------+------------+--------+           PSV cm/sEDV cm/sStenosisDescribe    Comments +----------+--------+--------+--------+------------+--------+ CCA Prox  56      11                                   +----------+--------+--------+--------+------------+--------+ CCA Distal69      12                                   +----------+--------+--------+--------+------------+--------+ ICA Prox  49      11      1-39%   heterogenous         +----------+--------+--------+--------+------------+--------+ ICA Distal62      11                                   +----------+--------+--------+--------+------------+--------+ ECA       57      4                                    +----------+--------+--------+--------+------------+--------+ +----------+--------+--------+----------------+-------------------+ SubclavianPSV cm/sEDV cm/sDescribe        Arm Pressure (mmHG) +----------+--------+--------+----------------+-------------------+           98              Multiphasic, WNL                    +----------+--------+--------+----------------+-------------------+ +---------+--------+--+--------+-+---------+ VertebralPSV cm/s25EDV cm/s5Antegrade +---------+--------+--+--------+-+---------+  Summary: Right Carotid: Velocities in the right ICA are consistent with a 1-39% stenosis. Left Carotid: Velocities in the left ICA are consistent with a 1-39% stenosis. Vertebrals:  Bilateral vertebral arteries demonstrate antegrade flow. Subclavians: Normal flow hemodynamics were seen in bilateral subclavian              arteries. *See table(s) above for measurements and observations.  Electronically signed by Antony Contras MD on 06/26/2018 at 12:48:03 PM.    Final     Assessment & Plan:   Diagnoses and all orders for this visit:  Neoplasm of uncertain behavior -     Dermatology pathology  Other orders -  triamcinolone  ointment (KENALOG) 0.5 %; Apply 1 application topically 2 (two) times daily. On the leg rash     Meds ordered this encounter  Medications  . triamcinolone ointment (KENALOG) 0.5 %    Sig: Apply 1 application topically 2 (two) times daily. On the leg rash    Dispense:  45 g    Refill:  3     Follow-up: Return in about 3 months (around 10/31/2019) for a follow-up visit.  Walker Kehr, MD

## 2019-08-01 ENCOUNTER — Other Ambulatory Visit: Payer: Self-pay | Admitting: Internal Medicine

## 2019-08-01 DIAGNOSIS — D0461 Carcinoma in situ of skin of right upper limb, including shoulder: Secondary | ICD-10-CM | POA: Diagnosis not present

## 2019-08-28 ENCOUNTER — Other Ambulatory Visit: Payer: Self-pay | Admitting: Internal Medicine

## 2019-08-28 NOTE — Telephone Encounter (Signed)
Pt last saw Dr Caryl Comes 02/20/19 telemedicine Covid-19, last labs 05/21/19 Creat 1.00, age 84, weight 72.6kg, based on specified criteria pt is on appropriate dosage of Eliquis 5mg  BID.  Will refill rx.

## 2019-09-15 ENCOUNTER — Ambulatory Visit (INDEPENDENT_AMBULATORY_CARE_PROVIDER_SITE_OTHER): Payer: Medicare HMO | Admitting: *Deleted

## 2019-09-15 DIAGNOSIS — I255 Ischemic cardiomyopathy: Secondary | ICD-10-CM

## 2019-09-15 DIAGNOSIS — I5022 Chronic systolic (congestive) heart failure: Secondary | ICD-10-CM

## 2019-09-16 LAB — CUP PACEART REMOTE DEVICE CHECK
Battery Remaining Longevity: 28 mo
Battery Remaining Percentage: 51 %
Battery Voltage: 2.92 V
Brady Statistic AP VP Percent: 95 %
Brady Statistic AP VS Percent: 1 %
Brady Statistic AS VP Percent: 2.9 %
Brady Statistic AS VS Percent: 1 %
Brady Statistic RA Percent Paced: 94 %
Date Time Interrogation Session: 20210830020019
HighPow Impedance: 70 Ohm
HighPow Impedance: 70 Ohm
Implantable Lead Implant Date: 20131017
Implantable Lead Implant Date: 20131017
Implantable Lead Implant Date: 20131017
Implantable Lead Location: 753858
Implantable Lead Location: 753859
Implantable Lead Location: 753860
Implantable Pulse Generator Implant Date: 20190426
Lead Channel Impedance Value: 350 Ohm
Lead Channel Impedance Value: 380 Ohm
Lead Channel Impedance Value: 760 Ohm
Lead Channel Pacing Threshold Amplitude: 0.75 V
Lead Channel Pacing Threshold Amplitude: 1.125 V
Lead Channel Pacing Threshold Amplitude: 2.875 V
Lead Channel Pacing Threshold Pulse Width: 0.5 ms
Lead Channel Pacing Threshold Pulse Width: 0.5 ms
Lead Channel Pacing Threshold Pulse Width: 1.5 ms
Lead Channel Sensing Intrinsic Amplitude: 11.7 mV
Lead Channel Sensing Intrinsic Amplitude: 2.3 mV
Lead Channel Setting Pacing Amplitude: 2 V
Lead Channel Setting Pacing Amplitude: 2.125
Lead Channel Setting Pacing Amplitude: 3.375
Lead Channel Setting Pacing Pulse Width: 0.5 ms
Lead Channel Setting Pacing Pulse Width: 1.5 ms
Lead Channel Setting Sensing Sensitivity: 0.5 mV
Pulse Gen Serial Number: 9820905

## 2019-09-18 NOTE — Progress Notes (Signed)
Remote ICD transmission.   

## 2019-11-04 ENCOUNTER — Ambulatory Visit: Payer: Medicare HMO | Admitting: Internal Medicine

## 2019-12-15 ENCOUNTER — Ambulatory Visit (INDEPENDENT_AMBULATORY_CARE_PROVIDER_SITE_OTHER): Payer: Medicare HMO

## 2019-12-15 DIAGNOSIS — I255 Ischemic cardiomyopathy: Secondary | ICD-10-CM | POA: Diagnosis not present

## 2019-12-15 LAB — CUP PACEART REMOTE DEVICE CHECK
Battery Remaining Longevity: 25 mo
Battery Remaining Percentage: 46 %
Battery Voltage: 2.92 V
Brady Statistic AP VP Percent: 95 %
Brady Statistic AP VS Percent: 1 %
Brady Statistic AS VP Percent: 2.7 %
Brady Statistic AS VS Percent: 1 %
Brady Statistic RA Percent Paced: 95 %
Date Time Interrogation Session: 20211129020016
HighPow Impedance: 80 Ohm
HighPow Impedance: 80 Ohm
Implantable Lead Implant Date: 20131017
Implantable Lead Implant Date: 20131017
Implantable Lead Implant Date: 20131017
Implantable Lead Location: 753858
Implantable Lead Location: 753859
Implantable Lead Location: 753860
Implantable Pulse Generator Implant Date: 20190426
Lead Channel Impedance Value: 340 Ohm
Lead Channel Impedance Value: 360 Ohm
Lead Channel Impedance Value: 740 Ohm
Lead Channel Pacing Threshold Amplitude: 0.875 V
Lead Channel Pacing Threshold Amplitude: 1.125 V
Lead Channel Pacing Threshold Amplitude: 2.875 V
Lead Channel Pacing Threshold Pulse Width: 0.5 ms
Lead Channel Pacing Threshold Pulse Width: 0.5 ms
Lead Channel Pacing Threshold Pulse Width: 1.5 ms
Lead Channel Sensing Intrinsic Amplitude: 11.7 mV
Lead Channel Sensing Intrinsic Amplitude: 2.5 mV
Lead Channel Setting Pacing Amplitude: 2 V
Lead Channel Setting Pacing Amplitude: 2.125
Lead Channel Setting Pacing Amplitude: 3.375
Lead Channel Setting Pacing Pulse Width: 0.5 ms
Lead Channel Setting Pacing Pulse Width: 1.5 ms
Lead Channel Setting Sensing Sensitivity: 0.5 mV
Pulse Gen Serial Number: 9820905

## 2019-12-17 DIAGNOSIS — R69 Illness, unspecified: Secondary | ICD-10-CM | POA: Diagnosis not present

## 2019-12-19 NOTE — Progress Notes (Signed)
Remote ICD transmission.   

## 2020-02-06 ENCOUNTER — Other Ambulatory Visit: Payer: Self-pay | Admitting: Internal Medicine

## 2020-02-23 ENCOUNTER — Other Ambulatory Visit: Payer: Self-pay | Admitting: Internal Medicine

## 2020-02-23 NOTE — Telephone Encounter (Signed)
Eliquis 5mg  refill request received. Patient is 85 years old, weight-72.6kg, Crea-1.00 on 05/21/2019, Diagnosis-Afib, and last seen by Dr. Caryl Comes on 02/20/2019 via Telemedicine-NEEDS A FOLLOW UP APPT. Dose is appropriate based on dosing criteria.   Pt needs an appt with Cardiologist. Will send a message to scheduling dept before sending.

## 2020-02-26 NOTE — Telephone Encounter (Signed)
Pt son called in and stated he is concerned about pt being off the Eliquis any period of time.  He wanted to up and appt but pt only wants to see Dr Caryl Comes and is request a Virtual.   Apps and in office offered but pt would like to see Caryl Comes only   Best number -[phone]- dwayne

## 2020-02-26 NOTE — Telephone Encounter (Signed)
Called the son [name] (on [place]) and he stated he was unsure about the information received earlier by the scheduling dept. Explained to him that the pt is overdue for an appt and we do need her to see Dr. Caryl Comes at their earliest convenience, however, we do not want her to run out of meds at any given time. He states he will be able to call tomorrow and get her an appt, advised when it is set I will check back to ensure she has enough Eliquis for that duration; and I will send in a 30 day supply at this time to avoid her missing her doses. He stated he updated the phone numbers since we had incorrect numbers and he was thankful for the explanation and assistance. Eliquis 30 day supply sent at this time.

## 2020-02-26 NOTE — Telephone Encounter (Signed)
We will refill Eliquis rx to get pt to appt with Dr Caryl Comes once scheduled.  Please call pt and schedule soonest follow-up appt available with Dr Caryl Comes. Thanks.

## 2020-03-04 ENCOUNTER — Other Ambulatory Visit: Payer: Self-pay | Admitting: Internal Medicine

## 2020-03-04 NOTE — Telephone Encounter (Signed)
Prescription refill request for Eliquis received. Indication: Afib Last office visit: Caryl Comes,  02/20/2019 Scr: 1.0, 05/21/2019 Age: 85 yo  Weight:72.6 kg  Pt is on the correct dose of Eliquis per dosing criteria, 5mg  BID. Prescription refill sent. Pt is scheduled to see Dr.Klein on 03/18/2020

## 2020-03-15 ENCOUNTER — Ambulatory Visit (INDEPENDENT_AMBULATORY_CARE_PROVIDER_SITE_OTHER): Payer: Medicare HMO

## 2020-03-15 DIAGNOSIS — I255 Ischemic cardiomyopathy: Secondary | ICD-10-CM

## 2020-03-15 LAB — CUP PACEART REMOTE DEVICE CHECK
Battery Remaining Longevity: 22 mo
Battery Remaining Percentage: 41 %
Battery Voltage: 2.92 V
Brady Statistic AP VP Percent: 96 %
Brady Statistic AP VS Percent: 1 %
Brady Statistic AS VP Percent: 2.5 %
Brady Statistic AS VS Percent: 1 %
Brady Statistic RA Percent Paced: 95 %
Date Time Interrogation Session: 20220228020016
HighPow Impedance: 70 Ohm
HighPow Impedance: 70 Ohm
Implantable Lead Implant Date: 20131017
Implantable Lead Implant Date: 20131017
Implantable Lead Implant Date: 20131017
Implantable Lead Location: 753858
Implantable Lead Location: 753859
Implantable Lead Location: 753860
Implantable Pulse Generator Implant Date: 20190426
Lead Channel Impedance Value: 350 Ohm
Lead Channel Impedance Value: 390 Ohm
Lead Channel Impedance Value: 790 Ohm
Lead Channel Pacing Threshold Amplitude: 0.875 V
Lead Channel Pacing Threshold Amplitude: 1.125 V
Lead Channel Pacing Threshold Amplitude: 3.125 V
Lead Channel Pacing Threshold Pulse Width: 0.5 ms
Lead Channel Pacing Threshold Pulse Width: 0.5 ms
Lead Channel Pacing Threshold Pulse Width: 1.5 ms
Lead Channel Sensing Intrinsic Amplitude: 1.6 mV
Lead Channel Sensing Intrinsic Amplitude: 11.7 mV
Lead Channel Setting Pacing Amplitude: 2 V
Lead Channel Setting Pacing Amplitude: 2.125
Lead Channel Setting Pacing Amplitude: 3.625
Lead Channel Setting Pacing Pulse Width: 0.5 ms
Lead Channel Setting Pacing Pulse Width: 1.5 ms
Lead Channel Setting Sensing Sensitivity: 0.5 mV
Pulse Gen Serial Number: 9820905

## 2020-03-18 ENCOUNTER — Telehealth (INDEPENDENT_AMBULATORY_CARE_PROVIDER_SITE_OTHER): Payer: Medicare HMO | Admitting: Internal Medicine

## 2020-03-18 ENCOUNTER — Encounter: Payer: Medicare HMO | Admitting: Internal Medicine

## 2020-03-18 ENCOUNTER — Other Ambulatory Visit: Payer: Self-pay

## 2020-03-18 ENCOUNTER — Telehealth: Payer: Self-pay

## 2020-03-18 VITALS — Ht 63.0 in | Wt 151.0 lb

## 2020-03-18 DIAGNOSIS — I255 Ischemic cardiomyopathy: Secondary | ICD-10-CM

## 2020-03-18 DIAGNOSIS — Z95810 Presence of automatic (implantable) cardiac defibrillator: Secondary | ICD-10-CM | POA: Diagnosis not present

## 2020-03-18 DIAGNOSIS — R001 Bradycardia, unspecified: Secondary | ICD-10-CM

## 2020-03-18 DIAGNOSIS — I5022 Chronic systolic (congestive) heart failure: Secondary | ICD-10-CM

## 2020-03-18 DIAGNOSIS — I4891 Unspecified atrial fibrillation: Secondary | ICD-10-CM

## 2020-03-18 DIAGNOSIS — Z79899 Other long term (current) drug therapy: Secondary | ICD-10-CM | POA: Diagnosis not present

## 2020-03-18 DIAGNOSIS — I4901 Ventricular fibrillation: Secondary | ICD-10-CM

## 2020-03-18 NOTE — Patient Instructions (Signed)
Medication Instructions:  Your physician recommends that you continue on your current medications as directed. Please refer to the Current Medication list given to you today.  Pt has current refills on cardiac medications.  Refills are not needed at this time. *If you need a refill on your cardiac medications before your next appointment, please call your pharmacy*   Lab Work: BMET 04/14/2020 - scheduled  If you have labs (blood work) drawn today and your tests are completely normal, you will receive your results only by: Marland Kitchen MyChart Message (if you have MyChart) OR . A paper copy in the mail If you have any lab test that is abnormal or we need to change your treatment, we will call you to review the results.   Testing/Procedures: None ordered.    Follow-Up: At [facility], you and your health needs are our priority.  As part of our continuing mission to provide you with exceptional heart care, we have created designated Provider Care Teams.  These Care Teams include your primary Cardiologist (physician) and Advanced Practice Providers (APPs -  Physician Assistants and Nurse Practitioners) who all work together to provide you with the care you need, when you need it.  We recommend signing up for the patient portal called "MyChart".  Sign up information is provided on this After Visit Summary.  MyChart is used to connect with patients for Virtual Visits (Telemedicine).  Patients are able to view lab/test results, encounter notes, upcoming appointments, etc.  Non-urgent messages can be sent to your provider as well.   To learn more about what you can do with MyChart, go to [email or web address].    Your next appointment:   6 months in person with Dr Caryl Comes

## 2020-03-18 NOTE — Progress Notes (Signed)
Electrophysiology TeleHealth Note   Due to national recommendations of social distancing due to COVID 19, an audio/video telehealth visit is felt to be most appropriate for this patient at this time.  See MyChart message from today for the patient's consent to telehealth for [facility].   Date:  03/18/2020   ID:  Alicia Guerrero, DOB 1935-09-18, MRN [id number]  Location: patient's home  Provider location: [address] [place]  Evaluation Performed: Follow-up visit  PCP:  [name], [name], MD  Cardiologist:     Electrophysiologist:  SK   Chief Complaint:  *congestive heart failure  History of Present Illness:    Alicia Guerrero is a 85 y.o. female who presents via audio/video conferencing for a telehealth visit today.  Since last being seen in our clinic for Seen in followup for aborted cardiac arrest and CRT-D implant ( St Jude 10/13-JA), Generator change 2019 with high LV pacing threshold w coronary artery disease and left bundle branch block, the patient reports doing exceedingly well without CP edema PND orthpnea palps or LH  Some SOB if she works too long in the yard  Tolerating meds  BP was elevated for a few months following COVID shot #1  Have decided with PCP not to get COVID #2    She was hospitalized 11/14 for strokes x2 that occurred in the context of a supratherapeutic INR. It was elected to switch her to apixaban.    DATE TEST EF   11/14` Echo  30-35%   4/19 Echo  55-65%   6/20 Echo 55-60% E/E' 15         Date Cr K Hgb  5/17 1.13  13.8  4/19 1.04  13.7  6/20 0.92 3.3 12.9  5/21 1.0 4.5        The patient denies symptoms of fevers, chills, cough, or new SOB worrisome for COVID 19.     Past Medical History:  Diagnosis Date  . Allergic rhinitis due to pollen   . Asthma   . Atrial fibrillation ([place])   . Biventricular ICD (implantable cardiac defibrillator) in place    St Jude 10/2011 (implant MD: Allred)  . Bradycardia     a. coreg tapered off 05/2011 => b. Metoprolol restarted after cardiac arrest 09/2011  . CAD (coronary artery disease)    a. s/p stent to RCA 1996;  b. s/p cutting balloon PTCA to LAD 2001; c. s/p Cypher DES to LAD 2003;  d. NSTEMI 4/12: DES to dRCA;  e.  [place] 9/13 (after presentation with cardiac arrest and + Nz's for NSTEMI): pLAD 30% ISR, oD1 30%, dLAD 60-70%, pOM1 (small vessel) 90%, oOM2 95% (moderate to large vessel), mCFX 30%, pOM3 30-40%, dRCA stents and pPDA stents patent with 30% ISR => med Rx rec.   . Cancer ([place])   . Cardiac arrest ([place]) 09/2011   a. VT/VF in community; resusc unsuccessful;  PEA in ED; multiple defibs => revived;  [employer];  c/b by VDRF, cardiogenic shock;  LHC with stable anatomy => med Rx;  difficult ween from vent => s/p trach;  d/c to SNF  . Chronic eczema    hands  . Chronic systolic heart failure ([place])   . Hemorrhoids   . HLD (hyperlipidemia)   . HTN (hypertension)   . Ischemic cardiomyopathy    a. EF 40% at cath 4/12 with AL and Inf HK;  b.  Echo after presentation with cardiac arrest 9/13:  EF  40-45% and severe inf HK to AK c/w infarction, PASP 42  . Left bundle branch block   . Other and unspecified ovarian cyst   . Other atopic dermatitis and related conditions   . Personal history of hyperthyroidism   . Personal history of malignant neoplasm of breast     Past Surgical History:  Procedure Laterality Date  . ABDOMINAL HYSTERECTOMY    . BI-VENTRICULAR IMPLANTABLE CARDIOVERTER DEFIBRILLATOR N/A 11/02/2011   Procedure: BI-VENTRICULAR IMPLANTABLE CARDIOVERTER DEFIBRILLATOR  (CRT-D);  Surgeon: Thompson Grayer, MD;  Location: [facility] CATH LAB;  Service: Cardiovascular;  Laterality: N/A;  . BIV ICD GENERATOR CHANGEOUT N/A 05/11/2017   Procedure: BIV ICD GENERATOR CHANGEOUT;  Surgeon: [name], MD;  Location: [facility] CV LAB;  Service: Cardiovascular;  Laterality: N/A;  . BREAST LUMPECTOMY  08/24/2003   right lumpectomy+sln,T2N0,ERPR+,Her2-  .  CHOLECYSTECTOMY    . COLONOSCOPY    . CORONARY STENT PLACEMENT  may and  july 2012  . LEFT HEART CATHETERIZATION WITH CORONARY ANGIOGRAM N/A 10/09/2011   Procedure: LEFT HEART CATHETERIZATION WITH CORONARY ANGIOGRAM;  Surgeon: Larey Dresser, MD;  Location: [facility] CATH LAB;  Service: Cardiovascular;  Laterality: N/A;  . OOPHORECTOMY    . POLYPECTOMY    . PTCA    . VIDEO BRONCHOSCOPY  10/26/2011   Procedure: VIDEO BRONCHOSCOPY WITHOUT FLUORO;  Surgeon: Raylene Miyamoto, MD;  Location: [facility] ENDOSCOPY;  Service: Endoscopy;  Laterality: Bilateral;    Current Outpatient Medications  Medication Sig Dispense Refill  . atorvastatin (LIPITOR) 40 MG tablet TAKE 1 TABLET DAILY. PLEASEKEEP SCHEDULED APPOINTMENT WITH    YOUR DOCTOR FOR    REFILLS 90 tablet 3  . bisoprolol (ZEBETA) 5 MG tablet Take 1 tablet (5 mg total) by mouth 2 (two) times daily. 180 tablet 3  . Blood Glucose Monitoring Suppl (ONE TOUCH ULTRA 2) w/Device KIT Use to check blood sugars daily Dx e11.9 1 each 0  . cetirizine (ZYRTEC) 10 MG tablet Take 1 tablet (10 mg total) by mouth as needed for allergies. (Patient taking differently: Take 10 mg by mouth daily.) 90 tablet 3  . Cholecalciferol (VITAMIN D3) 1.25 MG (50000 UT) CAPS Take 1 capsule by mouth once a week. 6 capsule 0  . Cholecalciferol (VITAMIN D3) 50 MCG (2000 UT) capsule Take 1 capsule (2,000 Units total) by mouth daily. 100 capsule 3  . Cyanocobalamin 1000 MCG SUBL Place 1 tablet (1,000 mcg total) under the tongue daily. 100 tablet 11  . ELIQUIS 5 MG TABS tablet Take 1 tablet by mouth twice daily 180 tablet 0  . glucose blood (ONE TOUCH ULTRA TEST) test strip 1 each by Other route daily as needed for other. Use to check blood sugars twice a day Dx E11.9 50 each 11  . Lancets (ONETOUCH ULTRASOFT) lancets 1 each by Other route 2 (two) times daily. Use to check blood sugars twice a day Dx E11.9 100 each 3  . metFORMIN (GLUCOPHAGE) 500 MG tablet Take 1 tablet (500 mg total) by mouth  daily with breakfast. 90 tablet 3  . Multiple Vitamins-Minerals (PRESERVISION AREDS PO) Take 1 capsule by mouth 2 (two) times daily.    . nitroGLYCERIN (NITROSTAT) 0.4 MG SL tablet Place 1 tablet (0.4 mg total) under the tongue every 5 (five) minutes x 3 doses as needed for chest pain. Chest pain 20 tablet 3  . Respiratory Therapy Supplies (NEBULIZER) DEVI Use with solution Q6H PRN wheezing or shortness of breath 1 each 0  . spironolactone (ALDACTONE) 25  MG tablet Take 1 tablet (25 mg total) by mouth daily. Please make yearly appt with Dr. Caryl Comes for February 2022 for future refills. Thank you 1st attempt 90 tablet 0  . telmisartan (MICARDIS) 40 MG tablet Take 1 tablet (40 mg total) by mouth daily. 90 tablet 3  . triamcinolone ointment (KENALOG) 0.5 % Apply 1 application topically 2 (two) times daily. On the leg rash 45 g 3  . doxycycline (VIBRA-TABS) 100 MG tablet Take 1 tablet (100 mg total) by mouth 2 (two) times daily. (Patient not taking: Reported on 03/18/2020) 14 tablet 0   No current facility-administered medications for this visit.    Allergies:   Coreg [carvedilol], [name] [trandolapril], Meperidine hcl, Penicillins, Molds & smuts, Tape, and Tetanus toxoids   Social History:  The patient  reports that she has never smoked. She has never used smokeless tobacco. She reports that she does not drink alcohol and does not use drugs.   Family History:  The patient's   family history includes Clotting disorder in her brother; Coronary artery disease in her father; Endometrial cancer in her sister; Heart disease in her brother, brother, and sister; Hypertension in her mother; Uterine cancer in her mother.   ROS:  Please see the history of present illness.   All other systems are personally reviewed and negative.    Exam:    Vital Signs:  Ht 5' 3"  (1.6 m)   Wt 151 lb (68.5 kg)   BMI 26.75 kg/m     Labs/Other Tests and Data Reviewed:    Recent Labs: 05/21/2019: ALT 14; BUN 16; Creatinine, Ser  1.00; Potassium 4.5; Sodium 141   Wt Readings from Last 3 Encounters:  03/18/20 151 lb (68.5 kg)  07/31/19 160 lb (72.6 kg)  06/10/19 161 lb (73 kg)     Other studies personally reviewed: Additional studies/ records that were reviewed today include:As above    Last device remote is reviewed from [facility] PDF dated 2/22 which reveals normal device function-- no intercurrent Rx arrhythmias  ECG personally reviewed from 6/20  AV pacing with upright QRS V1 and RS in lead 1  ASSESSMENT & PLAN:   Atrial fibrillation  Heart Failure-chronic systolic  Hypertension  Ventricular Tachycardia/fibrillation    Sinus Bradycardia  LV lead threshold-high  Biventricular ICD St Jude  Weight loss ( presumably real with two measurement (6&7/20)    functional status is good and by report euvolemic  BP well controlled, need to check K on aldactone  No bleeding on Apixoban -- weight is stable at about 65 kg so continue to monitor and for now continue 5 mg bid  Elevated LV threshhold is having an impact on battery and will need to think about strategies at change out    COVID 19 screen The patient denies symptoms of COVID 19 at this time.  The importance of social distancing was discussed today.  Next months: As Scheduled     Current medicines are reviewed at length with the patient today.   The patient does not have concerns regarding her medicines.  The following changes were made today:  REFILLS NEEDED FOR  -Apixoban   -spironolactone -micardis -atrovastatin -bisoprolol  Labs/ tests ordered today include: BMET on  3/30  No orders of the defined types were placed in this encounter.  F/iu 73m Future tests ( post COVID )     Patient Risk:  after full review of this patients clinical status, I feel that they are at moderate  risk at this time.  Today, I have spent 14 minutes with the patient with telehealth technology discussing the above.    Signed, Virl Axe, MD   03/18/2020 2:17 PM     [address] [address] [phone] (office) [phone] (fax)

## 2020-03-18 NOTE — Telephone Encounter (Signed)
  Patient Consent for Virtual Visit         Alicia Guerrero has provided verbal consent on 03/18/2020 for a virtual visit (video or telephone).   CONSENT FOR VIRTUAL VISIT FOR:  Alicia Guerrero  By participating in this virtual visit I agree to the following:  I hereby voluntarily request, consent and authorize [facility] and its employed or contracted physicians, physician assistants, nurse practitioners or other licensed health care professionals (the Practitioner), to provide me with telemedicine health care services (the "Services") as deemed necessary by the treating Practitioner. I acknowledge and consent to receive the Services by the Practitioner via telemedicine. I understand that the telemedicine visit will involve communicating with the Practitioner through live audiovisual communication technology and the disclosure of certain medical information by electronic transmission. I acknowledge that I have been given the opportunity to request an in-person assessment or other available alternative prior to the telemedicine visit and am voluntarily participating in the telemedicine visit.  I understand that I have the right to withhold or withdraw my consent to the use of telemedicine in the course of my care at any time, without affecting my right to future care or treatment, and that the Practitioner or I may terminate the telemedicine visit at any time. I understand that I have the right to inspect all information obtained and/or recorded in the course of the telemedicine visit and may receive copies of available information for a reasonable fee.  I understand that some of the potential risks of receiving the Services via telemedicine include:  Marland Kitchen Delay or interruption in medical evaluation due to technological equipment failure or disruption; . Information transmitted may not be sufficient (e.g. poor resolution of images) to allow for appropriate medical decision making by the Practitioner;  and/or  . In rare instances, security protocols could fail, causing a breach of personal health information.  Furthermore, I acknowledge that it is my responsibility to provide information about my medical history, conditions and care that is complete and accurate to the best of my ability. I acknowledge that Practitioner's advice, recommendations, and/or decision may be based on factors not within their control, such as incomplete or inaccurate data provided by me or distortions of diagnostic images or specimens that may result from electronic transmissions. I understand that the practice of medicine is not an exact science and that Practitioner makes no warranties or guarantees regarding treatment outcomes. I acknowledge that a copy of this consent can be made available to me via my patient portal ([facility]), or I can request a printed copy by calling the office of [facility].    I understand that my insurance will be billed for this visit.   I have read or had this consent read to me. . I understand the contents of this consent, which adequately explains the benefits and risks of the Services being provided via telemedicine.  . I have been provided ample opportunity to ask questions regarding this consent and the Services and have had my questions answered to my satisfaction. . I give my informed consent for the services to be provided through the use of telemedicine in my medical care

## 2020-03-19 NOTE — Progress Notes (Signed)
Remote ICD transmission.   

## 2020-04-14 ENCOUNTER — Other Ambulatory Visit: Payer: Medicare HMO | Admitting: *Deleted

## 2020-04-14 ENCOUNTER — Other Ambulatory Visit: Payer: Self-pay

## 2020-04-14 DIAGNOSIS — Z79899 Other long term (current) drug therapy: Secondary | ICD-10-CM

## 2020-04-14 DIAGNOSIS — I5022 Chronic systolic (congestive) heart failure: Secondary | ICD-10-CM

## 2020-04-14 DIAGNOSIS — Z95810 Presence of automatic (implantable) cardiac defibrillator: Secondary | ICD-10-CM | POA: Diagnosis not present

## 2020-04-14 DIAGNOSIS — I255 Ischemic cardiomyopathy: Secondary | ICD-10-CM

## 2020-04-15 LAB — BASIC METABOLIC PANEL
BUN/Creatinine Ratio: 18 (ref 12–28)
BUN: 17 mg/dL (ref 8–27)
CO2: 21 mmol/L (ref 20–29)
Calcium: 10.6 mg/dL — ABNORMAL HIGH (ref 8.7–10.3)
Chloride: 103 mmol/L (ref 96–106)
Creatinine, Ser: 0.94 mg/dL (ref 0.57–1.00)
Glucose: 107 mg/dL — ABNORMAL HIGH (ref 65–99)
Potassium: 4.7 mmol/L (ref 3.5–5.2)
Sodium: 143 mmol/L (ref 134–144)
eGFR: 61 mL/min/{1.73_m2} (ref >59)

## 2020-04-29 ENCOUNTER — Telehealth: Payer: Self-pay

## 2020-04-29 NOTE — Telephone Encounter (Signed)
-----   Message from [name], MD sent at 04/24/2020  4:11 PM EDT ----- Please Inform Patient  Labs are normal x mildle elevated Ca-- can we get ionized Ca and if abnormal her PCP can do the workup  Thanks

## 2020-04-29 NOTE — Telephone Encounter (Signed)
Spoke with pt's son [name],DPR and advised per Dr Caryl Comes, pt with abnormal calcium level and will need additional lab (ionized calcium) for further evaluation.  Pt's son verbalizes understanding and agrees with current plan.  Appointment scheduled for 05/12/2020 and lab order placed.

## 2020-05-12 ENCOUNTER — Other Ambulatory Visit: Payer: Self-pay

## 2020-05-12 ENCOUNTER — Other Ambulatory Visit: Payer: Medicare HMO | Admitting: *Deleted

## 2020-05-12 ENCOUNTER — Other Ambulatory Visit: Payer: Self-pay | Admitting: Internal Medicine

## 2020-05-12 MED ORDER — ELIQUIS 5 MG PO TABS: 1.0000 | ORAL_TABLET | Freq: Two times a day (BID) | ORAL | 1 refills | Status: DC

## 2020-05-12 NOTE — Telephone Encounter (Signed)
Age 85, weight 68.5kg, SCr 0.94 on 04/14/20, last visit 03/18/20, afib indication

## 2020-05-13 LAB — CALCIUM, IONIZED: Calcium, Ion: 4.9 mg/dL (ref 4.5–5.6)

## 2020-05-19 ENCOUNTER — Telehealth: Payer: Self-pay

## 2020-05-19 NOTE — Telephone Encounter (Signed)
-----   Message from [name], MD sent at 05/18/2020 12:22 PM EDT ----- Please Inform Patient that labs are normal  Thanks

## 2020-05-19 NOTE — Telephone Encounter (Signed)
Spoke with pt's son Karma [place], [place] and advised labs are normal per Dr Caryl Comes.  Pt's son verbalizes understanding and thanked Therapist, sports for the call.  Pt 14 son also reports pt has had a bout of diarrhea for the past 3 days.  She is taking Immodium and has increased her fluids.  Pt's son advised to contact pt's PCP for further evaluation.  Pt's son verbalizes understanding and agrees with current plan.

## 2020-06-10 ENCOUNTER — Other Ambulatory Visit: Payer: Self-pay | Admitting: Internal Medicine

## 2020-06-14 LAB — CUP PACEART REMOTE DEVICE CHECK
Battery Remaining Longevity: 17 mo
Battery Remaining Percentage: 37 %
Battery Voltage: 2.89 V
Brady Statistic AP VP Percent: 96 %
Brady Statistic AP VS Percent: 1 %
Brady Statistic AS VP Percent: 2.4 %
Brady Statistic AS VS Percent: 1 %
Brady Statistic RA Percent Paced: 95 %
Date Time Interrogation Session: 20220530020022
HighPow Impedance: 68 Ohm
HighPow Impedance: 68 Ohm
Implantable Lead Implant Date: 20131017
Implantable Lead Implant Date: 20131017
Implantable Lead Implant Date: 20131017
Implantable Lead Location: 753858
Implantable Lead Location: 753859
Implantable Lead Location: 753860
Implantable Pulse Generator Implant Date: 20190426
Lead Channel Impedance Value: 330 Ohm
Lead Channel Impedance Value: 360 Ohm
Lead Channel Impedance Value: 660 Ohm
Lead Channel Pacing Threshold Amplitude: 1.25 V
Lead Channel Pacing Threshold Amplitude: 1.25 V
Lead Channel Pacing Threshold Amplitude: 3.75 V
Lead Channel Pacing Threshold Pulse Width: 0.5 ms
Lead Channel Pacing Threshold Pulse Width: 0.5 ms
Lead Channel Pacing Threshold Pulse Width: 1.5 ms
Lead Channel Sensing Intrinsic Amplitude: 2.3 mV
Lead Channel Sensing Intrinsic Amplitude: 5.3 mV
Lead Channel Setting Pacing Amplitude: 2.25 V
Lead Channel Setting Pacing Amplitude: 2.25 V
Lead Channel Setting Pacing Amplitude: 5 V
Lead Channel Setting Pacing Pulse Width: 0.5 ms
Lead Channel Setting Pacing Pulse Width: 1.5 ms
Lead Channel Setting Sensing Sensitivity: 0.5 mV
Pulse Gen Serial Number: 9820905

## 2020-06-15 ENCOUNTER — Ambulatory Visit (INDEPENDENT_AMBULATORY_CARE_PROVIDER_SITE_OTHER): Payer: Medicare HMO

## 2020-06-15 DIAGNOSIS — I255 Ischemic cardiomyopathy: Secondary | ICD-10-CM

## 2020-07-06 NOTE — Progress Notes (Signed)
Remote ICD transmission.   

## 2020-08-11 ENCOUNTER — Other Ambulatory Visit: Payer: Self-pay | Admitting: Internal Medicine

## 2020-09-07 ENCOUNTER — Other Ambulatory Visit: Payer: Self-pay | Admitting: Internal Medicine

## 2020-09-08 ENCOUNTER — Telehealth: Payer: Self-pay

## 2020-09-08 MED ORDER — BISOPROLOL FUMARATE 5 MG PO TABS: 5.0000 mg | ORAL_TABLET | Freq: Two times a day (BID) | ORAL | 0 refills | Status: DC

## 2020-09-08 NOTE — Telephone Encounter (Signed)
Pt's son is calling to check on the status of the refill.   Please advise.

## 2020-09-13 ENCOUNTER — Ambulatory Visit (INDEPENDENT_AMBULATORY_CARE_PROVIDER_SITE_OTHER): Payer: Medicare HMO

## 2020-09-13 DIAGNOSIS — I255 Ischemic cardiomyopathy: Secondary | ICD-10-CM

## 2020-09-13 LAB — CUP PACEART REMOTE DEVICE CHECK
Battery Remaining Longevity: 14 mo
Battery Remaining Percentage: 32 %
Battery Voltage: 2.89 V
Brady Statistic AP VP Percent: 96 %
Brady Statistic AP VS Percent: 1 %
Brady Statistic AS VP Percent: 2.5 %
Brady Statistic AS VS Percent: 1 %
Brady Statistic RA Percent Paced: 95 %
Date Time Interrogation Session: 20220829020017
HighPow Impedance: 63 Ohm
HighPow Impedance: 63 Ohm
Implantable Lead Implant Date: 20131017
Implantable Lead Implant Date: 20131017
Implantable Lead Implant Date: 20131017
Implantable Lead Location: 753858
Implantable Lead Location: 753859
Implantable Lead Location: 753860
Implantable Pulse Generator Implant Date: 20190426
Lead Channel Impedance Value: 330 Ohm
Lead Channel Impedance Value: 380 Ohm
Lead Channel Impedance Value: 660 Ohm
Lead Channel Pacing Threshold Amplitude: 1 V
Lead Channel Pacing Threshold Amplitude: 1 V
Lead Channel Pacing Threshold Amplitude: 3.75 V
Lead Channel Pacing Threshold Pulse Width: 0.5 ms
Lead Channel Pacing Threshold Pulse Width: 0.5 ms
Lead Channel Pacing Threshold Pulse Width: 1.5 ms
Lead Channel Sensing Intrinsic Amplitude: 12 mV
Lead Channel Sensing Intrinsic Amplitude: 2.6 mV
Lead Channel Setting Pacing Amplitude: 2 V
Lead Channel Setting Pacing Amplitude: 2 V
Lead Channel Setting Pacing Amplitude: 4.25 V
Lead Channel Setting Pacing Pulse Width: 0.5 ms
Lead Channel Setting Pacing Pulse Width: 1.5 ms
Lead Channel Setting Sensing Sensitivity: 0.5 mV
Pulse Gen Serial Number: 9820905

## 2020-09-22 ENCOUNTER — Other Ambulatory Visit: Payer: Self-pay | Admitting: Internal Medicine

## 2020-09-23 NOTE — Progress Notes (Signed)
Remote ICD transmission.   

## 2020-09-29 ENCOUNTER — Ambulatory Visit (INDEPENDENT_AMBULATORY_CARE_PROVIDER_SITE_OTHER): Payer: Medicare HMO | Admitting: Internal Medicine

## 2020-09-29 ENCOUNTER — Other Ambulatory Visit: Payer: Self-pay

## 2020-09-29 ENCOUNTER — Encounter: Payer: Self-pay | Admitting: Internal Medicine

## 2020-09-29 VITALS — BP 142/84 | HR 61 | Temp 98.4°F | Ht 63.0 in | Wt 143.0 lb

## 2020-09-29 DIAGNOSIS — Z23 Encounter for immunization: Secondary | ICD-10-CM

## 2020-09-29 DIAGNOSIS — L989 Disorder of the skin and subcutaneous tissue, unspecified: Secondary | ICD-10-CM | POA: Diagnosis not present

## 2020-09-29 DIAGNOSIS — D485 Neoplasm of uncertain behavior of skin: Secondary | ICD-10-CM

## 2020-09-29 MED ORDER — METFORMIN HCL 500 MG PO TABS: 500.0000 mg | ORAL_TABLET | Freq: Every day | ORAL | 3 refills | Status: AC

## 2020-09-29 MED ORDER — ATORVASTATIN CALCIUM 40 MG PO TABS: ORAL_TABLET | ORAL | 3 refills | Status: DC

## 2020-09-29 MED ORDER — BISOPROLOL FUMARATE 5 MG PO TABS: 5.0000 mg | ORAL_TABLET | Freq: Two times a day (BID) | ORAL | 2 refills | Status: DC

## 2020-09-29 NOTE — Progress Notes (Signed)
Subjective:  Patient ID: Alicia Guerrero, female    DOB: 1935/04/14  Age: 85 y.o. MRN: [id number]  CC: [place] (BACK OF (R) ARM)   HPI Alicia Guerrero presents for a skin lesion on the right posterior upper arm of several months duration.  She is here with her son      Outpatient Medications Prior to Visit  Medication Sig Dispense Refill   apixaban (ELIQUIS) 5 MG TABS tablet Take 1 tablet (5 mg total) by mouth 2 (two) times daily. 180 tablet 1   Blood Glucose Monitoring Suppl (ONE TOUCH ULTRA 2) w/Device KIT Use to check blood sugars daily Dx e11.9 1 each 0   cetirizine (ZYRTEC) 10 MG tablet Take 1 tablet (10 mg total) by mouth as needed for allergies. (Patient taking differently: Take 10 mg by mouth daily.) 90 tablet 3   Cholecalciferol (VITAMIN D3) 50 MCG (2000 UT) capsule Take 1 capsule (2,000 Units total) by mouth daily. 100 capsule 3   Cyanocobalamin 1000 MCG SUBL Place 1 tablet (1,000 mcg total) under the tongue daily. 100 tablet 11   doxycycline (VIBRA-TABS) 100 MG tablet Take 1 tablet (100 mg total) by mouth 2 (two) times daily. 14 tablet 0   glucose blood (ONE TOUCH ULTRA TEST) test strip 1 each by Other route daily as needed for other. Use to check blood sugars twice a day Dx E11.9 50 each 11   Lancets (ONETOUCH ULTRASOFT) lancets 1 each by Other route 2 (two) times daily. Use to check blood sugars twice a day Dx E11.9 100 each 3   Multiple Vitamins-Minerals (PRESERVISION AREDS PO) Take 1 capsule by mouth 2 (two) times daily.     nitroGLYCERIN (NITROSTAT) 0.4 MG SL tablet Place 1 tablet (0.4 mg total) under the tongue every 5 (five) minutes x 3 doses as needed for chest pain. Chest pain 20 tablet 3   Respiratory Therapy Supplies (NEBULIZER) DEVI Use with solution Q6H PRN wheezing or shortness of breath 1 each 0   spironolactone (ALDACTONE) 25 MG tablet Take 1 tablet (25 mg total) by mouth daily. 90 tablet 3   telmisartan (MICARDIS) 40 MG tablet Take 1 tablet by mouth once daily  90 tablet 2   triamcinolone ointment (KENALOG) 0.5 % Apply 1 application topically 2 (two) times daily. On the leg rash 45 g 3   atorvastatin (LIPITOR) 40 MG tablet Take 1 tablet by mouth once daily 30 tablet 0   bisoprolol (ZEBETA) 5 MG tablet Take 1 tablet (5 mg total) by mouth 2 (two) times daily. Keep scheduled appt for future refills 60 tablet 0   bisoprolol (ZEBETA) 5 MG tablet Take 1 tablet (5 mg total) by mouth 2 (two) times daily. Must keep appt for future refills 60 tablet 0   metFORMIN (GLUCOPHAGE) 500 MG tablet Take 1 tablet (500 mg total) by mouth daily with breakfast. Keep scheduled appt for future refills 30 tablet 0   Cholecalciferol (VITAMIN D3) 1.25 MG (50000 UT) CAPS Take 1 capsule by mouth once a week. (Patient not taking: Reported on 09/29/2020) 6 capsule 0   No facility-administered medications prior to visit.    ROS: Review of Systems  Constitutional:  Positive for fatigue. Negative for appetite change and unexpected weight change.  Skin:  Positive for color change.  Psychiatric/Behavioral:  Positive for decreased concentration.    Objective:  BP (!) 142/84 (BP Location: Left Arm)   Pulse 61   Temp 98.4 F (36.9 C) (Oral)   Ht 5'  3" (1.6 m)   Wt 143 lb (64.9 kg)   SpO2 95%   BMI 25.33 kg/m   BP Readings from Last 3 Encounters:  09/29/20 (!) 142/84  07/31/19 (!) 156/88  06/10/19 (!) 158/98    Wt Readings from Last 3 Encounters:  09/29/20 143 lb (64.9 kg)  03/18/20 151 lb (68.5 kg)  07/31/19 160 lb (72.6 kg)    Physical Exam Constitutional:      Appearance: She is obese.  Skin:    Findings: Lesion present.  Neurological:     Gait: Gait abnormal.  Psychiatric:        Behavior: Behavior normal.  There is a brown, irregular colored lesion with raised area measuring about 2 x 1 cm on the right posterior upper arm.  Nontender.  No drainage.  Lab Results  Component Value Date   WBC 7.0 03/11/2019   HGB 12.8 03/11/2019   HCT 38.8 03/11/2019   PLT  286 03/11/2019   GLUCOSE 107 (H) 04/14/2020   CHOL 203 (H) 06/26/2018   TRIG 196 (H) 06/26/2018   HDL 44 06/26/2018   LDLDIRECT 91.1 03/21/2010   LDLCALC 120 (H) 06/26/2018   ALT 14 05/21/2019   AST 18 05/21/2019   NA 143 04/14/2020   K 4.7 04/14/2020   CL 103 04/14/2020   CREATININE 0.94 04/14/2020   BUN 17 04/14/2020   CO2 21 04/14/2020   TSH 2.054 06/25/2018   INR 3.3 12/09/2012   HGBA1C 7.0 (H) 05/21/2019    CT Head Wo Contrast  Result Date: 06/25/2018 CLINICAL DATA:  Imbalance, forgetfulness. Expressive aphasia. EXAM: CT HEAD WITHOUT CONTRAST TECHNIQUE: Contiguous axial images were obtained from the base of the skull through the vertex without intravenous contrast. COMPARISON:  12/06/2012. FINDINGS: Brain: No evidence for acute infarction, hemorrhage, mass lesion, or extra-axial fluid. Generalized atrophy, not unexpected for age. Large cortical infarcts affect the LEFT frontal and posterior frontal regions of the brain, with encephalomalacia and regional gliosis. Superimposed is extensive chronic microvascular ischemic change throughout the white matter. Hydrocephalus ex vacuo. Remote LEFT thalamic lacunar infarct could have been acute in 2014. There may be a chronic LEFT brainstem infarct at the lower midbrain/upper pontine level. Vascular: Calcification of the cavernous internal carotid arteries consistent with cerebrovascular atherosclerotic disease. No signs of intracranial large vessel occlusion. Skull: Calvarium intact. Sinuses/Orbits: No acute findings. Other: None. IMPRESSION: Chronic changes as described. Remote LEFT hemisphere infarcts similar to priors. No acute intracranial findings. Electronically Signed   By: Staci Righter M.D.   On: 06/25/2018 15:19   MR BRAIN WO CONTRAST  Result Date: 06/26/2018 CLINICAL DATA:  Memory decline over the past 2 weeks. EXAM: MRI HEAD WITHOUT CONTRAST TECHNIQUE: Multiplanar, multiecho pulse sequences of the brain and surrounding structures  were obtained without intravenous contrast. COMPARISON:  Head CT 06/25/2018 FINDINGS: Brain: There is no evidence of acute infarct, intracranial hemorrhage, mass, midline shift, or extra-axial fluid collection. Chronic moderate-sized infarcts are noted in the left frontal and left parietal lobes. Chronic lacunar infarcts are noted in the left thalamus and at the midbrain-pons junction just left of midline. Patchy to confluent T2 hyperintensities in the cerebral white matter bilaterally are nonspecific but compatible with moderate to severe chronic small vessel ischemic disease. There is moderate cerebral atrophy. Vascular: Major intracranial vascular flow voids are preserved. Skull and upper cervical spine: Unremarkable bone marrow signal. Sinuses/Orbits: Bilateral cataract extraction. Chronic left maxillary sinusitis. Trace mastoid effusions. Other: None. IMPRESSION: 1. No acute intracranial abnormality. 2. Moderate to  severe chronic small vessel ischemic disease with multiple chronic infarcts as above. Electronically Signed   By: Logan Bores M.D.   On: 06/26/2018 16:12   ECHOCARDIOGRAM COMPLETE  Result Date: 06/26/2018   ECHOCARDIOGRAM REPORT   Patient Name:   TABATHA RAZZANO Date of Exam: 06/26/2018 Medical Rec #:  [id number]       Height:       63.0 in Accession #:    [id number]      Weight:       174.0 lb Date of Birth:  March 09, 1935       BSA:          1.82 m Patient Age:    55 years        BP:           172/69 mmHg Patient Gender: F               HR:           60 bpm. Exam Location:  Inpatient  Procedure: 2D Echo Indications:    Atrial Fibrillation 427.31  History:        Patient has prior history of Echocardiogram examinations, most                 recent 05/09/2017. CAD Defibrillator Risk Factors: Hypertension                 and Dyslipidemia.  Sonographer:    Mikki Santee RDCS (AE) Referring Phys: [phone] [place]  1. The left ventricle has normal systolic function, with an  ejection fraction of 55-60%. The cavity size was normal. There is mildly increased left ventricular wall thickness. Left ventricular diastolic Doppler parameters are consistent with impaired relaxation. Elevated left ventricular end-diastolic pressure The E/e' is 15.5.  2. The right ventricle has normal systolic function. The cavity was normal. There is no increase in right ventricular wall thickness.  3. The mitral valve is abnormal. There is mild mitral annular calcification present.  4. Mild sclerosis of the aortic valve.  5. No intracardiac thrombi or masses were visualized.  6. When compared to the prior study: 05/09/2017 - no significant change has occured. Side by side comparison of images performed. FINDINGS  Left Ventricle: The left ventricle has normal systolic function, with an ejection fraction of 55-60%. The cavity size was normal. There is mildly increased left ventricular wall thickness. Left ventricular diastolic Doppler parameters are consistent with impaired relaxation. Elevated left ventricular end-diastolic pressure The E/e' is 15.5. Right Ventricle: The right ventricle has normal systolic function. The cavity was normal. There is no increase in right ventricular wall thickness. Pacing wire/catheter visualized in the right ventricle. Left Atrium: Left atrial size was normal in size. Right Atrium: Right atrial size was normal in size. Right atrial pressure is estimated at 3 mmHg. Interatrial Septum: No atrial level shunt detected by color flow Doppler. Pericardium: There is no evidence of pericardial effusion. Mitral Valve: The mitral valve is abnormal. There is mild mitral annular calcification present. Mitral valve regurgitation is mild by color flow Doppler. Tricuspid Valve: The tricuspid valve is normal in structure. Tricuspid valve regurgitation is trivial by color flow Doppler. Aortic Valve: The aortic valve is normal in structure. Mild sclerosis of the aortic valve. Aortic valve  regurgitation was not visualized by color flow Doppler. Pulmonic Valve: The pulmonic valve was grossly normal. Pulmonic valve regurgitation is trivial by color flow Doppler. Venous: The inferior vena cava is normal in size with  greater than 50% respiratory variability. Compared to previous exam: 05/09/2017 - no significant change has occured. Side by side comparison of images performed.  +--------------+--------++ LEFT VENTRICLE         +----------------+---------++ +--------------+--------++ Diastology                PLAX 2D                +----------------+---------++ +--------------+--------++ LV e' lateral:  3.15 cm/s LVIDd:        4.83 cm  +----------------+---------++ +--------------+--------++ LV E/e' lateral:15.1      LVIDs:        3.41 cm  +----------------+---------++ +--------------+--------++ LV e' medial:   3.07 cm/s LV PW:        1.20 cm  +----------------+---------++ +--------------+--------++ LV E/e' medial: 15.5      LV IVS:       1.21 cm  +----------------+---------++ +--------------+--------++ LVOT diam:    2.10 cm  +--------------+--------++ LV SV:        61 ml    +--------------+--------++ LV SV Index:  32.25    +--------------+--------++ LVOT Area:    3.46 cm +--------------+--------++                        +--------------+--------++ +---------------+---------++ RIGHT VENTRICLE          +---------------+---------++ RV S prime:    9.64 cm/s +---------------+---------++ TAPSE (M-mode):1.8 cm    +---------------+---------++ RVSP:          14.2 mmHg +---------------+---------++ +---------------+-------++-----------++ LEFT ATRIUM           Index       +---------------+-------++-----------++ LA diam:       3.00 cm1.65 cm/m  +---------------+-------++-----------++ LA Vol (A2C):  60.9 ml33.42 ml/m +---------------+-------++-----------++ LA Vol (A4C):  41.0 ml22.50 ml/m  +---------------+-------++-----------++ LA Biplane Vol:50.8 ml27.87 ml/m +---------------+-------++-----------++ +------------+---------++----------++ RIGHT ATRIUM         Index      +------------+---------++----------++ RA Pressure:3.00 mmHg           +------------+---------++----------++ RA Area:    8.52 cm            +------------+---------++----------++ RA Volume:  13.80 ml 7.57 ml/m +------------+---------++----------++  +------------+-----------++ AORTIC VALVE            +------------+-----------++ LVOT Vmax:  75.70 cm/s  +------------+-----------++ LVOT Vmean: 51.900 cm/s +------------+-----------++ LVOT VTI:   0.162 m     +------------+-----------++  +-------------+-------++ AORTA                +-------------+-------++ Ao Root diam:3.30 cm +-------------+-------++ +--------------+----------++ +---------------+-----------++ MITRAL VALVE             TRICUSPID VALVE            +--------------+----------++ +---------------+-----------++ MV Area (PHT):2.54 cm   TR Peak grad:  11.2 mmHg   +--------------+----------++ +---------------+-----------++ MV PHT:       86.71 msec TR Vmax:       167.00 cm/s +--------------+----------++ +---------------+-----------++ MV Decel Time:299 msec   Estimated RAP: 3.00 mmHg   +--------------+----------++ +---------------+-----------++ +-------------+-----------++ RVSP:          14.2 mmHg   MR Peak grad:96.4 mmHg   +---------------+-----------++ +-------------+-----------++ MR Vmax:     491.00 cm/s +--------------+-------+ +-------------+-----------++ SHUNTS                +--------------+----------++ +--------------+-------+ MV E velocity:47.60 cm/s Systemic VTI: 0.16 m  +--------------+----------++ +--------------+-------+ MV A velocity:79.30 cm/s Systemic Diam:2.10 cm +--------------+----------++ +--------------+-------+ MV E/A ratio: 0.60        +--------------+----------++  Cherlynn Kaiser MD Electronically signed by Cherlynn Kaiser MD Signature Date/Time: 06/26/2018/2:43:58 PM    Final    VAS US CAROTID (at [facility] and [facility] only)  Result Date: 06/26/2018 Carotid Arterial Duplex Study Indications:       CVA and balance problem. Risk Factors:      Hypertension, hyperlipidemia. Comparison Study:  no prior Performing Technologist: June Leap RDMS, RVT  Examination Guidelines: A complete evaluation includes B-mode imaging, spectral Doppler, color Doppler, and power Doppler as needed of all accessible portions of each vessel. Bilateral testing is considered an integral part of a complete examination. Limited examinations for reoccurring indications may be performed as noted.  Right Carotid Findings: +----------+--------+--------+--------+------------+--------+           PSV cm/sEDV cm/sStenosisDescribe    Comments +----------+--------+--------+--------+------------+--------+ CCA Prox  51      9                                    +----------+--------+--------+--------+------------+--------+ CCA Distal62      14                                   +----------+--------+--------+--------+------------+--------+ ICA Prox  65      13      1-39%   heterogenous         +----------+--------+--------+--------+------------+--------+ ICA Distal62      21                                   +----------+--------+--------+--------+------------+--------+ ECA       66      10                                   +----------+--------+--------+--------+------------+--------+ +----------+--------+-------+----------------+-------------------+           PSV cm/sEDV cmsDescribe        Arm Pressure (mmHG) +----------+--------+-------+----------------+-------------------+ [id number]             Multiphasic, WNL                    +----------+--------+-------+----------------+-------------------+ +---------+--------+--+--------+-+---------+  VertebralPSV cm/s34EDV cm/s8Antegrade +---------+--------+--+--------+-+---------+  Left Carotid Findings: +----------+--------+--------+--------+------------+--------+           PSV cm/sEDV cm/sStenosisDescribe    Comments +----------+--------+--------+--------+------------+--------+ CCA Prox  56      11                                   +----------+--------+--------+--------+------------+--------+ CCA Distal69      12                                   +----------+--------+--------+--------+------------+--------+ ICA Prox  49      11      1-39%   heterogenous         +----------+--------+--------+--------+------------+--------+ ICA Distal62      11                                   +----------+--------+--------+--------+------------+--------+ ECA  57      4                                    +----------+--------+--------+--------+------------+--------+ +----------+--------+--------+----------------+-------------------+ SubclavianPSV cm/sEDV cm/sDescribe        Arm Pressure (mmHG) +----------+--------+--------+----------------+-------------------+           98              Multiphasic, WNL                    +----------+--------+--------+----------------+-------------------+ +---------+--------+--+--------+-+---------+ VertebralPSV cm/s25EDV cm/s5Antegrade +---------+--------+--+--------+-+---------+  Summary: Right Carotid: Velocities in the right ICA are consistent with a 1-39% stenosis. Left Carotid: Velocities in the left ICA are consistent with a 1-39% stenosis. Vertebrals:  Bilateral vertebral arteries demonstrate antegrade flow. Subclavians: Normal flow hemodynamics were seen in bilateral subclavian              arteries. *See table(s) above for measurements and observations.  Electronically signed by Antony Contras MD on 06/26/2018 at 12:48:03 PM.    Final     Assessment & Plan:   Problem List Items Addressed This Visit     Neoplasm  of uncertain behavior of skin     There is a brown, irregular colored lesion with raised area measuring about 2 x 1 cm on the right posterior upper arm.  Nontender.  No drainage.  Discussed with the patient and her son.  R dist post arm lesion.  Rule out melanoma.  Options discussed to diagnose/treat.  Dermatology referral was made.      Relevant Orders   Ambulatory referral to Dermatology   Other Visit Diagnoses     Needs flu shot    -  Primary   Relevant Orders   Flu Vaccine QUAD High Dose(Fluad) (Completed)         Follow-up: Return in about 3 months (around 12/29/2020) for a follow-up visit.  Walker Kehr, MD

## 2020-09-29 NOTE — Assessment & Plan Note (Addendum)
There is a brown, irregular colored lesion with raised area measuring about 2 x 1 cm on the right posterior upper arm.  Nontender.  No drainage.  Discussed with the patient and her son.  R dist post arm lesion.  Rule out melanoma.  Options discussed to diagnose/treat.  Dermatology referral was made.

## 2020-10-07 DIAGNOSIS — D485 Neoplasm of uncertain behavior of skin: Secondary | ICD-10-CM | POA: Diagnosis not present

## 2020-10-07 DIAGNOSIS — D0361 Melanoma in situ of right upper limb, including shoulder: Secondary | ICD-10-CM | POA: Diagnosis not present

## 2020-10-14 DIAGNOSIS — C4361 Malignant melanoma of right upper limb, including shoulder: Secondary | ICD-10-CM | POA: Diagnosis not present

## 2020-10-14 DIAGNOSIS — L821 Other seborrheic keratosis: Secondary | ICD-10-CM | POA: Diagnosis not present

## 2020-10-14 DIAGNOSIS — D1801 Hemangioma of skin and subcutaneous tissue: Secondary | ICD-10-CM | POA: Diagnosis not present

## 2020-10-14 DIAGNOSIS — L814 Other melanin hyperpigmentation: Secondary | ICD-10-CM | POA: Diagnosis not present

## 2020-11-12 DIAGNOSIS — Z7901 Long term (current) use of anticoagulants: Secondary | ICD-10-CM | POA: Diagnosis not present

## 2020-11-12 DIAGNOSIS — R918 Other nonspecific abnormal finding of lung field: Secondary | ICD-10-CM | POA: Diagnosis not present

## 2020-11-12 DIAGNOSIS — K8689 Other specified diseases of pancreas: Secondary | ICD-10-CM | POA: Diagnosis not present

## 2020-11-12 DIAGNOSIS — C4361 Malignant melanoma of right upper limb, including shoulder: Secondary | ICD-10-CM | POA: Diagnosis not present

## 2020-11-12 DIAGNOSIS — Z8673 Personal history of transient ischemic attack (TIA), and cerebral infarction without residual deficits: Secondary | ICD-10-CM | POA: Diagnosis not present

## 2020-11-12 DIAGNOSIS — K862 Cyst of pancreas: Secondary | ICD-10-CM | POA: Diagnosis not present

## 2020-11-19 DIAGNOSIS — N189 Chronic kidney disease, unspecified: Secondary | ICD-10-CM | POA: Diagnosis not present

## 2020-11-19 DIAGNOSIS — I2510 Atherosclerotic heart disease of native coronary artery without angina pectoris: Secondary | ICD-10-CM | POA: Diagnosis not present

## 2020-11-19 DIAGNOSIS — E785 Hyperlipidemia, unspecified: Secondary | ICD-10-CM | POA: Diagnosis not present

## 2020-11-19 DIAGNOSIS — I5032 Chronic diastolic (congestive) heart failure: Secondary | ICD-10-CM | POA: Diagnosis not present

## 2020-11-19 DIAGNOSIS — C4361 Malignant melanoma of right upper limb, including shoulder: Secondary | ICD-10-CM | POA: Diagnosis not present

## 2020-11-19 DIAGNOSIS — I4901 Ventricular fibrillation: Secondary | ICD-10-CM | POA: Diagnosis not present

## 2020-11-19 DIAGNOSIS — I130 Hypertensive heart and chronic kidney disease with heart failure and stage 1 through stage 4 chronic kidney disease, or unspecified chronic kidney disease: Secondary | ICD-10-CM | POA: Diagnosis not present

## 2020-11-19 DIAGNOSIS — I255 Ischemic cardiomyopathy: Secondary | ICD-10-CM | POA: Diagnosis not present

## 2020-11-19 DIAGNOSIS — I447 Left bundle-branch block, unspecified: Secondary | ICD-10-CM | POA: Diagnosis not present

## 2020-11-19 DIAGNOSIS — I4891 Unspecified atrial fibrillation: Secondary | ICD-10-CM | POA: Diagnosis not present

## 2020-11-24 DIAGNOSIS — C4359 Malignant melanoma of other part of trunk: Secondary | ICD-10-CM | POA: Diagnosis not present

## 2020-11-24 DIAGNOSIS — I4891 Unspecified atrial fibrillation: Secondary | ICD-10-CM | POA: Diagnosis not present

## 2020-11-24 DIAGNOSIS — I255 Ischemic cardiomyopathy: Secondary | ICD-10-CM | POA: Diagnosis not present

## 2020-11-24 DIAGNOSIS — I4901 Ventricular fibrillation: Secondary | ICD-10-CM | POA: Diagnosis not present

## 2020-11-24 DIAGNOSIS — I447 Left bundle-branch block, unspecified: Secondary | ICD-10-CM | POA: Diagnosis not present

## 2020-11-24 DIAGNOSIS — E785 Hyperlipidemia, unspecified: Secondary | ICD-10-CM | POA: Diagnosis not present

## 2020-11-24 DIAGNOSIS — C4361 Malignant melanoma of right upper limb, including shoulder: Secondary | ICD-10-CM | POA: Diagnosis not present

## 2020-11-24 DIAGNOSIS — I5032 Chronic diastolic (congestive) heart failure: Secondary | ICD-10-CM | POA: Diagnosis not present

## 2020-11-24 DIAGNOSIS — I2510 Atherosclerotic heart disease of native coronary artery without angina pectoris: Secondary | ICD-10-CM | POA: Diagnosis not present

## 2020-11-24 DIAGNOSIS — N189 Chronic kidney disease, unspecified: Secondary | ICD-10-CM | POA: Diagnosis not present

## 2020-11-24 DIAGNOSIS — I130 Hypertensive heart and chronic kidney disease with heart failure and stage 1 through stage 4 chronic kidney disease, or unspecified chronic kidney disease: Secondary | ICD-10-CM | POA: Diagnosis not present

## 2020-11-29 ENCOUNTER — Other Ambulatory Visit: Payer: Self-pay | Admitting: Internal Medicine

## 2020-11-29 NOTE — Telephone Encounter (Signed)
Prescription refill request for Eliquis received. Indication: Afib  Last office visit:06/15/20 Caryl Comes)  Scr: 0.83 (11/19/20)  Age: 85 Weight: 64.9kg  Appropriate dose and refill sent to requested pharmacy.

## 2020-12-13 ENCOUNTER — Ambulatory Visit (INDEPENDENT_AMBULATORY_CARE_PROVIDER_SITE_OTHER): Payer: Medicare HMO

## 2020-12-13 DIAGNOSIS — I255 Ischemic cardiomyopathy: Secondary | ICD-10-CM | POA: Diagnosis not present

## 2020-12-13 LAB — CUP PACEART REMOTE DEVICE CHECK
Battery Remaining Longevity: 13 mo
Battery Remaining Percentage: 29 %
Battery Voltage: 2.86 V
Brady Statistic AP VP Percent: 95 %
Brady Statistic AP VS Percent: 1 %
Brady Statistic AS VP Percent: 2.6 %
Brady Statistic AS VS Percent: 1 %
Brady Statistic RA Percent Paced: 95 %
Date Time Interrogation Session: 20221128020016
HighPow Impedance: 61 Ohm
HighPow Impedance: 61 Ohm
Implantable Lead Implant Date: 20131017
Implantable Lead Implant Date: 20131017
Implantable Lead Implant Date: 20131017
Implantable Lead Location: 753858
Implantable Lead Location: 753859
Implantable Lead Location: 753860
Implantable Pulse Generator Implant Date: 20190426
Lead Channel Impedance Value: 300 Ohm
Lead Channel Impedance Value: 350 Ohm
Lead Channel Impedance Value: 610 Ohm
Lead Channel Pacing Threshold Amplitude: 1 V
Lead Channel Pacing Threshold Amplitude: 1 V
Lead Channel Pacing Threshold Amplitude: 3.5 V
Lead Channel Pacing Threshold Pulse Width: 0.5 ms
Lead Channel Pacing Threshold Pulse Width: 0.5 ms
Lead Channel Pacing Threshold Pulse Width: 1.5 ms
Lead Channel Sensing Intrinsic Amplitude: 12 mV
Lead Channel Sensing Intrinsic Amplitude: 2.3 mV
Lead Channel Setting Pacing Amplitude: 2 V
Lead Channel Setting Pacing Amplitude: 2 V
Lead Channel Setting Pacing Amplitude: 4 V
Lead Channel Setting Pacing Pulse Width: 0.5 ms
Lead Channel Setting Pacing Pulse Width: 1.5 ms
Lead Channel Setting Sensing Sensitivity: 0.5 mV
Pulse Gen Serial Number: 9820905

## 2020-12-15 DIAGNOSIS — Z483 Aftercare following surgery for neoplasm: Secondary | ICD-10-CM | POA: Diagnosis not present

## 2020-12-15 DIAGNOSIS — C4361 Malignant melanoma of right upper limb, including shoulder: Secondary | ICD-10-CM | POA: Diagnosis not present

## 2020-12-16 ENCOUNTER — Emergency Department (HOSPITAL_COMMUNITY): Payer: Medicare HMO

## 2020-12-16 ENCOUNTER — Inpatient Hospital Stay (HOSPITAL_COMMUNITY)
Admission: EM | Admit: 2020-12-16 | Discharge: 2020-12-22 | DRG: 065 | Payer: Medicare HMO | Attending: Internal Medicine | Admitting: Internal Medicine

## 2020-12-16 ENCOUNTER — Other Ambulatory Visit: Payer: Self-pay

## 2020-12-16 DIAGNOSIS — E119 Type 2 diabetes mellitus without complications: Secondary | ICD-10-CM | POA: Diagnosis present

## 2020-12-16 DIAGNOSIS — Z91048 Other nonmedicinal substance allergy status: Secondary | ICD-10-CM

## 2020-12-16 DIAGNOSIS — I255 Ischemic cardiomyopathy: Secondary | ICD-10-CM | POA: Diagnosis present

## 2020-12-16 DIAGNOSIS — I69354 Hemiplegia and hemiparesis following cerebral infarction affecting left non-dominant side: Secondary | ICD-10-CM

## 2020-12-16 DIAGNOSIS — Z20822 Contact with and (suspected) exposure to covid-19: Secondary | ICD-10-CM | POA: Diagnosis present

## 2020-12-16 DIAGNOSIS — I672 Cerebral atherosclerosis: Secondary | ICD-10-CM | POA: Diagnosis not present

## 2020-12-16 DIAGNOSIS — Z79899 Other long term (current) drug therapy: Secondary | ICD-10-CM

## 2020-12-16 DIAGNOSIS — Z880 Allergy status to penicillin: Secondary | ICD-10-CM

## 2020-12-16 DIAGNOSIS — I6602 Occlusion and stenosis of left middle cerebral artery: Secondary | ICD-10-CM | POA: Diagnosis not present

## 2020-12-16 DIAGNOSIS — Z832 Family history of diseases of the blood and blood-forming organs and certain disorders involving the immune mechanism: Secondary | ICD-10-CM

## 2020-12-16 DIAGNOSIS — I63531 Cerebral infarction due to unspecified occlusion or stenosis of right posterior cerebral artery: Secondary | ICD-10-CM | POA: Diagnosis not present

## 2020-12-16 DIAGNOSIS — Z95810 Presence of automatic (implantable) cardiac defibrillator: Secondary | ICD-10-CM

## 2020-12-16 DIAGNOSIS — I10 Essential (primary) hypertension: Secondary | ICD-10-CM | POA: Diagnosis not present

## 2020-12-16 DIAGNOSIS — E876 Hypokalemia: Secondary | ICD-10-CM | POA: Diagnosis present

## 2020-12-16 DIAGNOSIS — I447 Left bundle-branch block, unspecified: Secondary | ICD-10-CM | POA: Diagnosis present

## 2020-12-16 DIAGNOSIS — I252 Old myocardial infarction: Secondary | ICD-10-CM

## 2020-12-16 DIAGNOSIS — Z853 Personal history of malignant neoplasm of breast: Secondary | ICD-10-CM

## 2020-12-16 DIAGNOSIS — I5022 Chronic systolic (congestive) heart failure: Secondary | ICD-10-CM | POA: Diagnosis present

## 2020-12-16 DIAGNOSIS — I6621 Occlusion and stenosis of right posterior cerebral artery: Secondary | ICD-10-CM | POA: Diagnosis not present

## 2020-12-16 DIAGNOSIS — Z955 Presence of coronary angioplasty implant and graft: Secondary | ICD-10-CM

## 2020-12-16 DIAGNOSIS — R570 Cardiogenic shock: Secondary | ICD-10-CM | POA: Diagnosis present

## 2020-12-16 DIAGNOSIS — J4520 Mild intermittent asthma, uncomplicated: Secondary | ICD-10-CM | POA: Diagnosis present

## 2020-12-16 DIAGNOSIS — Z8249 Family history of ischemic heart disease and other diseases of the circulatory system: Secondary | ICD-10-CM

## 2020-12-16 DIAGNOSIS — Z7901 Long term (current) use of anticoagulants: Secondary | ICD-10-CM

## 2020-12-16 DIAGNOSIS — Z7984 Long term (current) use of oral hypoglycemic drugs: Secondary | ICD-10-CM

## 2020-12-16 DIAGNOSIS — Z8049 Family history of malignant neoplasm of other genital organs: Secondary | ICD-10-CM

## 2020-12-16 DIAGNOSIS — I639 Cerebral infarction, unspecified: Secondary | ICD-10-CM

## 2020-12-16 DIAGNOSIS — Z8674 Personal history of sudden cardiac arrest: Secondary | ICD-10-CM

## 2020-12-16 DIAGNOSIS — I110 Hypertensive heart disease with heart failure: Secondary | ICD-10-CM | POA: Diagnosis present

## 2020-12-16 DIAGNOSIS — R0689 Other abnormalities of breathing: Secondary | ICD-10-CM | POA: Diagnosis not present

## 2020-12-16 DIAGNOSIS — R1312 Dysphagia, oropharyngeal phase: Secondary | ICD-10-CM | POA: Diagnosis present

## 2020-12-16 DIAGNOSIS — R29704 NIHSS score 4: Secondary | ICD-10-CM | POA: Diagnosis present

## 2020-12-16 DIAGNOSIS — I482 Chronic atrial fibrillation, unspecified: Secondary | ICD-10-CM | POA: Diagnosis present

## 2020-12-16 DIAGNOSIS — R29818 Other symptoms and signs involving the nervous system: Secondary | ICD-10-CM | POA: Diagnosis not present

## 2020-12-16 DIAGNOSIS — E782 Mixed hyperlipidemia: Secondary | ICD-10-CM | POA: Diagnosis present

## 2020-12-16 DIAGNOSIS — R4781 Slurred speech: Secondary | ICD-10-CM | POA: Diagnosis not present

## 2020-12-16 DIAGNOSIS — Z743 Need for continuous supervision: Secondary | ICD-10-CM | POA: Diagnosis not present

## 2020-12-16 DIAGNOSIS — I4891 Unspecified atrial fibrillation: Secondary | ICD-10-CM | POA: Diagnosis present

## 2020-12-16 DIAGNOSIS — I2510 Atherosclerotic heart disease of native coronary artery without angina pectoris: Secondary | ICD-10-CM | POA: Diagnosis present

## 2020-12-16 DIAGNOSIS — R531 Weakness: Secondary | ICD-10-CM | POA: Diagnosis not present

## 2020-12-16 LAB — COMPREHENSIVE METABOLIC PANEL
ALT: 30 U/L (ref 0–44)
AST: 32 U/L (ref 15–41)
Albumin: 3.9 g/dL (ref 3.5–5.0)
Alkaline Phosphatase: 67 U/L (ref 38–126)
Anion gap: 11 (ref 5–15)
BUN: 13 mg/dL (ref 8–23)
CO2: 29 mmol/L (ref 22–32)
Calcium: 9.1 mg/dL (ref 8.9–10.3)
Chloride: 101 mmol/L (ref 98–111)
Creatinine, Ser: 0.69 mg/dL (ref 0.44–1.00)
GFR, Estimated: >60 mL/min (ref >60)
Glucose, Bld: 112 mg/dL — ABNORMAL HIGH (ref 70–99)
Potassium: 2.7 mmol/L — CL (ref 3.5–5.1)
Sodium: 141 mmol/L (ref 135–145)
Total Bilirubin: 1 mg/dL (ref 0.3–1.2)
Total Protein: 6.3 g/dL — ABNORMAL LOW (ref 6.5–8.1)

## 2020-12-16 LAB — CBC
HCT: 39.4 % (ref 36.0–46.0)
Hemoglobin: 12.9 g/dL (ref 12.0–15.0)
MCH: 28 pg (ref 26.0–34.0)
MCHC: 32.7 g/dL (ref 30.0–36.0)
MCV: 85.5 fL (ref 80.0–100.0)
Platelets: 270 10*3/uL (ref 150–400)
RBC: 4.61 MIL/uL (ref 3.87–5.11)
RDW: 13.6 % (ref 11.5–15.5)
WBC: 9.7 10*3/uL (ref 4.0–10.5)
nRBC: 0 % (ref 0.0–0.2)

## 2020-12-16 LAB — DIFFERENTIAL
Abs Immature Granulocytes: 0.02 10*3/uL (ref 0.00–0.07)
Basophils Absolute: 0.1 10*3/uL (ref 0.0–0.1)
Basophils Relative: 1 %
Eosinophils Absolute: 0.5 10*3/uL (ref 0.0–0.5)
Eosinophils Relative: 5 %
Immature Granulocytes: 0 %
Lymphocytes Relative: 24 %
Lymphs Abs: 2.4 10*3/uL (ref 0.7–4.0)
Monocytes Absolute: 0.8 10*3/uL (ref 0.1–1.0)
Monocytes Relative: 8 %
Neutro Abs: 6 10*3/uL (ref 1.7–7.7)
Neutrophils Relative %: 62 %

## 2020-12-16 LAB — PROTIME-INR
INR: 1.6 — ABNORMAL HIGH (ref 0.8–1.2)
Prothrombin Time: 18.9 seconds — ABNORMAL HIGH (ref 11.4–15.2)

## 2020-12-16 LAB — ETHANOL: Alcohol, Ethyl (B): <10 mg/dL (ref <10)

## 2020-12-16 LAB — APTT: aPTT: 36 seconds (ref 24–36)

## 2020-12-16 MED ORDER — IOHEXOL 350 MG/ML SOLN
100.0000 mL | Freq: Once | INTRAVENOUS | Status: AC | PRN
  Administered 2020-12-16: 100 mL via INTRAVENOUS

## 2020-12-16 NOTE — ED Triage Notes (Signed)
Presents from home for L sided weakness, dysarthia, unable to ambulate ince 1630 today. Takes eliquis. H/o Mis (7), stents(5), CVA with R deficits. CBG 122, BP 160/109.

## 2020-12-16 NOTE — ED Notes (Signed)
Returned to CT for CTA head 1130  Family corrects report about this pt- hx of CVA affect R side of brain and does not typically exhibit deficits at baseline from this.

## 2020-12-16 NOTE — ED Provider Notes (Signed)
[facility] EMERGENCY DEPARTMENT Provider Note   CSN: [id number] Arrival date & time: 12/16/20  2248  An emergency department physician performed an initial assessment on this suspected stroke patient at 2349.  History Chief Complaint  Patient presents with   Code Stroke    Alicia Guerrero is a 85 y.o. female.  HPI 85 year old female presents as a code stroke.  History is mostly by EMS and somewhat from the patient.  Limited by the acuity of the situation.  At around 4:30 PM she tried to get up and noticed that her leg was weak.  She thinks it was both legs but EMS noticed her left side of her body was weak.  They noticed bilateral leg weakness but left arm weakness and slurred speech.  Patient does endorse that her speech is different though her vision is normal.  Some left-sided drift on arrival.  Past Medical History:  Diagnosis Date   Allergic rhinitis due to pollen    Asthma    Atrial fibrillation ([place])    Biventricular ICD (implantable cardiac defibrillator) in place    St Jude 10/2011 (implant MD: Allred)   Bradycardia    a. coreg tapered off 05/2011 => b. Metoprolol restarted after cardiac arrest 09/2011   CAD (coronary artery disease)    a. s/p stent to RCA 1996;  b. s/p cutting balloon PTCA to LAD 2001; c. s/p Cypher DES to LAD 2003;  d. NSTEMI 4/12: DES to dRCA;  e.  [place]zo 9/13 (after presentation with cardiac arrest and + Nz's for NSTEMI): pLAD 30% ISR, oD1 30%, dLAD 60-70%, pOM1 (small vessel) 90%, oOM2 95% (moderate to large vessel), mCFX 30%, pOM3 30-40%, dRCA stents and pPDA stents patent with 30% ISR => med Rx rec.    Cancer [facility])    Cardiac arrest ([place]) 09/2011   a. VT/VF in community; resusc unsuccessful;  PEA in ED; multiple defibs => revived;  [employer];  c/b by VDRF, cardiogenic shock;  LHC with stable anatomy => med Rx;  difficult ween from vent => s/p trach;  d/c to SNF   Chronic eczema    hands   Chronic systolic heart failure ([place])    Hemorrhoids     HLD (hyperlipidemia)    HTN (hypertension)    Ischemic cardiomyopathy    a. EF 40% at cath 4/12 with AL and Inf HK;  b.  Echo after presentation with cardiac arrest 9/13:  EF 40-45% and severe inf HK to AK c/w infarction, PASP 42   Left bundle branch block    Other and unspecified ovarian cyst    Other atopic dermatitis and related conditions    Personal history of hyperthyroidism    Personal history of malignant neoplasm of breast     Patient Active Problem List   Diagnosis Date Noted   Neoplasm of uncertain behavior of skin 06/10/2019   Vitamin D deficiency 06/10/2019   Vascular dementia ([place]) 07/04/2018   B12 deficiency 07/04/2018   Balance problem 06/25/2018   History of cardioembolic stroke 34/74/2595   Exhaustion delirium 06/25/2018   Balance disorder 06/25/2018   Acute bronchitis 07/02/2017   Diabetes mellitus type 2, noninsulin dependent ([place]) 05/14/2015   Intertriginous candidiasis 05/14/2015   Eczema 05/14/2015   Well adult exam 05/14/2014   Essential hypertension 01/28/2014   Hip pain 12/18/2013   Chronic renal failure 05/17/2013   Abnormal CT scan, chest 05/17/2013   Dyspnea 05/16/2013   Cough 05/06/2013   Cerebral infarction ([place]) 12/05/2012  Breast cancer ([place]) 08/01/2012    Diaphragmatic stimulation 98/92/1194   Chronic systolic heart failure ([place]) 12/20/2011   Long term (current) use of anticoagulants 11/10/2011   Cardiomyopathy, ischemic 11/02/2011   Biventricular defibrillator-St. Jude 11/02/2011   Sinus bradycardia 10/01/2011   Ventricular fibrillation ([place]) 09/26/2011   Left bundle branch block 05/25/2010   CAROTID ARTERY STENOSIS, WITHOUT INFARCTION 09/15/2009   ADENOCARCINOMA, BREAST, HX OF 09/26/2008   Atrial fibrillation ([place]) 07/09/2008   HYPERLIPIDEMIA, MIXED 01/01/2007   Coronary atherosclerosis 01/01/2007   Mild intermittent asthma in adult without complication 17/40/8144   HYPERTHYROIDISM, HX OF 01/01/2007   PERCUTANEOUS TRANSLUMINAL  CORONARY ANGIOPLASTY, HX OF 01/01/2007    Past Surgical History:  Procedure Laterality Date   ABDOMINAL HYSTERECTOMY     BI-VENTRICULAR IMPLANTABLE CARDIOVERTER DEFIBRILLATOR N/A 11/02/2011   Procedure: BI-VENTRICULAR IMPLANTABLE CARDIOVERTER DEFIBRILLATOR  (CRT-D);  Surgeon: Thompson Grayer, MD;  Location: [facility] CATH LAB;  Service: Cardiovascular;  Laterality: N/A;   BIV ICD GENERATOR CHANGEOUT N/A 05/11/2017   Procedure: BIV ICD GENERATOR CHANGEOUT;  Surgeon: [name], MD;  Location: [place] CV LAB;  Service: Cardiovascular;  Laterality: N/A;   BREAST LUMPECTOMY  08/24/2003   right lumpectomy+sln,T2N0,ERPR+,Her2-   CHOLECYSTECTOMY     COLONOSCOPY     CORONARY STENT PLACEMENT  may and  july 2012   LEFT HEART CATHETERIZATION WITH CORONARY ANGIOGRAM N/A 10/09/2011   Procedure: LEFT HEART CATHETERIZATION WITH CORONARY ANGIOGRAM;  Surgeon: Larey Dresser, MD;  Location[facility] C[facility] LAB;  Service: Cardiovascular;  Laterality: N/A;   OOPHORECTOMY     POLYPECTOMY     PTCA     VIDEO BRONCHOSCOPY  10/26/2011   Procedure: VIDEO BRONCHOSCOPY WITHOUT FLUORO;  Surgeon: Raylene Miyamoto, MD;  Location: [facility] ENDOSCOPY;  Service: Endoscopy;  Laterality: Bilateral;     OB History   No obstetric history on file.     Family History  Problem Relation Age of Onset   Uterine cancer Mother    Hypertension Mother    Coronary artery disease Father    Heart disease Brother    Heart disease Sister    Endometrial cancer Sister    Heart disease Brother    Clotting disorder Brother        blood clot   Colon cancer Neg Hx     Social History   Tobacco Use   Smoking status: Never   Smokeless tobacco: Never  Substance Use Topics   Alcohol use: No    Alcohol/week: 0.0 standard drinks   Drug use: No    Home Medications Prior to Admission medications   Medication Sig Start Date End Date Taking? Authorizing Provider  ELIQUIS 5 MG TABS tablet Take 1 tablet by mouth twice daily 11/29/20   [name], MD  atorvastatin (LIPITOR) 40 MG tablet Take 1 tablet by mouth once daily 09/29/20   [name], [name], MD  bisoprolol (ZEBETA) 5 MG tablet Take 1 tablet (5 mg total) by mouth 2 (two) times daily. 09/29/20   [name], [name], MD  Blood Glucose Monitoring Suppl (ONE TOUCH ULTRA 2) w/Device KIT Use to check blood sugars daily Dx e11.9 04/03/16   [name], [name], MD  cetirizine (ZYRTEC) 10 MG tablet Take 1 tablet (10 mg total) by mouth as needed for allergies. Patient taking differently: Take 10 mg by mouth daily. 05/19/16   [name], [name], MD  Cholecalciferol (VITAMIN D3) 50 MCG (2000 UT) capsule Take 1 capsule (2,000 Units total) by mouth daily. 05/21/19  [name], [name], MD  Cyanocobalamin 1000 MCG SUBL Place 1 tablet (1,000 mcg total) under the tongue daily. 05/21/19   [name], [name], MD  doxycycline (VIBRA-TABS) 100 MG tablet Take 1 tablet (100 mg total) by mouth 2 (two) times daily. 06/10/19   [name], [name], MD  glucose blood (ONE TOUCH ULTRA TEST) test strip 1 each by Other route daily as needed for other. Use to check blood sugars twice a day Dx E11.9 05/15/16   [name], [name], MD  Lancets [facility] ULTRASOFT) lancets 1 each by Other route 2 (two) times daily. Use to check blood sugars twice a day Dx E11.9 05/15/16   [name], [name], MD  metFORMIN (GLUCOPHAGE) 500 MG tablet Take 1 tablet (500 mg total) by mouth daily with breakfast. 09/29/20   [name], [name], MD  Multiple Vitamins-Minerals (PRESERVISION AREDS PO) Take 1 capsule by mouth 2 (two) times daily.    [provider]  nitroGLYCERIN (NITROSTAT) 0.4 MG SL tablet Place 1 tablet (0.4 mg total) under the tongue every 5 (five) minutes x 3 doses as needed for chest pain. Chest pain 02/20/19   [name], MD  Respiratory Therapy Supplies (NEBULIZER) DEVI Use with solution Q6H PRN wheezing or shortness of breath 07/10/17   [name], [name], MD  spironolactone (ALDACTONE) 25 MG  tablet Take 1 tablet (25 mg total) by mouth daily. 05/12/20   [name], MD  telmisartan (MICARDIS) 40 MG tablet Take 1 tablet by mouth once daily 09/22/20   [name], [name], MD  triamcinolone ointment (KENALOG) 0.5 % Apply 1 application topically 2 (two) times daily. On the leg rash 07/31/19   [name], [name], MD    Allergies    Coreg [carvedilol], [name] [trandolapril], Meperidine hcl, Penicillins, Molds & smuts, Tape, and Tetanus toxoids  Review of Systems   Review of Systems  Unable to perform ROS: Acuity of condition   Physical Exam Updated Vital Signs BP (!) 164/96 (BP Location: Left Arm)   Pulse 68   Temp 98.2 F (36.8 C) (Oral)   Resp 18   Wt 66.2 kg   SpO2 97%   BMI 25.86 kg/m   Physical Exam Vitals and nursing note reviewed.  Constitutional:      Appearance: She is well-developed.  HENT:     Head: Normocephalic and atraumatic.     Right Ear: External ear normal.     Left Ear: External ear normal.     Nose: Nose normal.  Eyes:     General:        Right eye: No discharge.        Left eye: No discharge.     Extraocular Movements: Extraocular movements intact.  Cardiovascular:     Rate and Rhythm: Normal rate. Rhythm irregular.  Pulmonary:     Effort: Pulmonary effort is normal.     Breath sounds: Normal breath sounds.  Abdominal:     General: There is no distension.     Tenderness: There is no abdominal tenderness.  Skin:    General: Skin is warm and dry.  Neurological:     Mental Status: She is alert.     Comments: She does have slurred speech.  She also has left upper extremity weakness that is mild compared to the right but with some mild drift.  Left lower extremity can barely come off the stretcher and then falls back down.  The right lower extremity has no weakness.  Psychiatric:  Mood and Affect: Mood is not anxious.    ED Results / Procedures / Treatments   Labs (all labs ordered are listed, but only abnormal results are  displayed) Labs Reviewed  PROTIME-INR - Abnormal; Notable for the following components:      Result Value   Prothrombin Time 18.9 (*)    INR 1.6 (*)    All other components within normal limits  RESP PANEL BY RT-PCR (FLU A&B, COVID) ARPGX2  APTT  CBC  DIFFERENTIAL  ETHANOL  COMPREHENSIVE METABOLIC PANEL  RAPID URINE DRUG SCREEN, HOSP PERFORMED  URINALYSIS, ROUTINE W REFLEX MICROSCOPIC  I-STAT CHEM 8, ED    EKG None  Radiology CT HEAD CODE STROKE WO CONTRAST  Result Date: 12/16/2020 CLINICAL DATA:  Code stroke.  Acute neurologic deficit EXAM: CT HEAD WITHOUT CONTRAST TECHNIQUE: Contiguous axial images were obtained from the base of the skull through the vertex without intravenous contrast. COMPARISON:  06/25/2018 FINDINGS: Brain: There is no mass, hemorrhage or extra-axial collection. There is generalized atrophy without lobar predilection. There is hypoattenuation of the periventricular white matter, most commonly indicating chronic ischemic microangiopathy. There is an old left thalamic small vessel infarct. Vascular: No abnormal hyperdensity of the major intracranial arteries or dural venous sinuses. No intracranial atherosclerosis. Skull: The visualized skull base, calvarium and extracranial soft tissues are normal. Sinuses/Orbits: No fluid levels or advanced mucosal thickening of the visualized paranasal sinuses. No mastoid or middle ear effusion. The orbits are normal. ASPECTS [facility] Stroke Program Early CT Score) - Ganglionic level infarction (caudate, lentiform nuclei, internal capsule, insula, M1-M3 cortex): 7 - Supraganglionic infarction (M4-M6 cortex): 3 Total score (0-10 with 10 being normal): 10 IMPRESSION: 1. No acute intracranial abnormality. 2. ASPECTS is 10. These results were called by telephone at the time of interpretation on 12/16/2020 at 11:03 pm to provider Sherwood Gambler , who verbally acknowledged these results. Electronically Signed   By: Ulyses Jarred M.D.   On:  12/16/2020 23:03    Procedures Procedures   Medications Ordered in ED Medications - No data to display  ED Course  I have reviewed the triage vital signs and the nursing notes.  Pertinent labs & imaging results that were available during my care of the patient were reviewed by me and considered in my medical decision making (see chart for details).    MDM Rules/Calculators/A&P                           Patient is not a tPA candidate.  However she does have acute left-sided weakness with speech difficulties, thus a potential LVO.  CTA/perfusion is currently pending. Care to Dr. Stark Jock.  Final Clinical Impression(s) / ED Diagnoses Final diagnoses:  None    Rx / DC Orders ED Discharge Orders     None        Sherwood Gambler, MD 12/17/20 0002

## 2020-12-17 ENCOUNTER — Observation Stay (HOSPITAL_COMMUNITY): Payer: Medicare HMO

## 2020-12-17 ENCOUNTER — Encounter (HOSPITAL_COMMUNITY): Payer: Self-pay | Admitting: Internal Medicine

## 2020-12-17 DIAGNOSIS — I110 Hypertensive heart disease with heart failure: Secondary | ICD-10-CM | POA: Diagnosis not present

## 2020-12-17 DIAGNOSIS — Z832 Family history of diseases of the blood and blood-forming organs and certain disorders involving the immune mechanism: Secondary | ICD-10-CM | POA: Diagnosis not present

## 2020-12-17 DIAGNOSIS — I5022 Chronic systolic (congestive) heart failure: Secondary | ICD-10-CM | POA: Diagnosis present

## 2020-12-17 DIAGNOSIS — E782 Mixed hyperlipidemia: Secondary | ICD-10-CM

## 2020-12-17 DIAGNOSIS — I482 Chronic atrial fibrillation, unspecified: Secondary | ICD-10-CM

## 2020-12-17 DIAGNOSIS — E119 Type 2 diabetes mellitus without complications: Secondary | ICD-10-CM | POA: Diagnosis not present

## 2020-12-17 DIAGNOSIS — Z20822 Contact with and (suspected) exposure to covid-19: Secondary | ICD-10-CM | POA: Diagnosis not present

## 2020-12-17 DIAGNOSIS — I447 Left bundle-branch block, unspecified: Secondary | ICD-10-CM | POA: Diagnosis not present

## 2020-12-17 DIAGNOSIS — I10 Essential (primary) hypertension: Secondary | ICD-10-CM | POA: Diagnosis not present

## 2020-12-17 DIAGNOSIS — E876 Hypokalemia: Secondary | ICD-10-CM | POA: Diagnosis not present

## 2020-12-17 DIAGNOSIS — Z8249 Family history of ischemic heart disease and other diseases of the circulatory system: Secondary | ICD-10-CM | POA: Diagnosis not present

## 2020-12-17 DIAGNOSIS — R570 Cardiogenic shock: Secondary | ICD-10-CM | POA: Diagnosis not present

## 2020-12-17 DIAGNOSIS — I63431 Cerebral infarction due to embolism of right posterior cerebral artery: Secondary | ICD-10-CM | POA: Diagnosis not present

## 2020-12-17 DIAGNOSIS — I639 Cerebral infarction, unspecified: Secondary | ICD-10-CM | POA: Diagnosis not present

## 2020-12-17 DIAGNOSIS — I6389 Other cerebral infarction: Secondary | ICD-10-CM | POA: Diagnosis not present

## 2020-12-17 DIAGNOSIS — I69354 Hemiplegia and hemiparesis following cerebral infarction affecting left non-dominant side: Secondary | ICD-10-CM | POA: Diagnosis not present

## 2020-12-17 DIAGNOSIS — I255 Ischemic cardiomyopathy: Secondary | ICD-10-CM

## 2020-12-17 DIAGNOSIS — R1312 Dysphagia, oropharyngeal phase: Secondary | ICD-10-CM | POA: Diagnosis not present

## 2020-12-17 DIAGNOSIS — Z955 Presence of coronary angioplasty implant and graft: Secondary | ICD-10-CM | POA: Diagnosis not present

## 2020-12-17 DIAGNOSIS — I63511 Cerebral infarction due to unspecified occlusion or stenosis of right middle cerebral artery: Secondary | ICD-10-CM | POA: Diagnosis not present

## 2020-12-17 DIAGNOSIS — I252 Old myocardial infarction: Secondary | ICD-10-CM | POA: Diagnosis not present

## 2020-12-17 DIAGNOSIS — Z8049 Family history of malignant neoplasm of other genital organs: Secondary | ICD-10-CM | POA: Diagnosis not present

## 2020-12-17 DIAGNOSIS — R410 Disorientation, unspecified: Secondary | ICD-10-CM | POA: Diagnosis not present

## 2020-12-17 DIAGNOSIS — Z853 Personal history of malignant neoplasm of breast: Secondary | ICD-10-CM | POA: Diagnosis not present

## 2020-12-17 DIAGNOSIS — I63531 Cerebral infarction due to unspecified occlusion or stenosis of right posterior cerebral artery: Secondary | ICD-10-CM | POA: Diagnosis not present

## 2020-12-17 DIAGNOSIS — I2510 Atherosclerotic heart disease of native coronary artery without angina pectoris: Secondary | ICD-10-CM | POA: Diagnosis not present

## 2020-12-17 DIAGNOSIS — Z95810 Presence of automatic (implantable) cardiac defibrillator: Secondary | ICD-10-CM | POA: Diagnosis not present

## 2020-12-17 DIAGNOSIS — Z8674 Personal history of sudden cardiac arrest: Secondary | ICD-10-CM | POA: Diagnosis not present

## 2020-12-17 DIAGNOSIS — R29704 NIHSS score 4: Secondary | ICD-10-CM | POA: Diagnosis not present

## 2020-12-17 DIAGNOSIS — G450 Vertebro-basilar artery syndrome: Secondary | ICD-10-CM | POA: Diagnosis not present

## 2020-12-17 DIAGNOSIS — I6300 Cerebral infarction due to thrombosis of unspecified precerebral artery: Secondary | ICD-10-CM | POA: Diagnosis not present

## 2020-12-17 LAB — RAPID URINE DRUG SCREEN, HOSP PERFORMED
Amphetamines: NOT DETECTED
Barbiturates: NOT DETECTED
Benzodiazepines: NOT DETECTED
Cocaine: NOT DETECTED
Opiates: NOT DETECTED
Tetrahydrocannabinol: NOT DETECTED

## 2020-12-17 LAB — URINALYSIS, ROUTINE W REFLEX MICROSCOPIC
Bilirubin Urine: NEGATIVE
Glucose, UA: NEGATIVE mg/dL
Hgb urine dipstick: NEGATIVE
Ketones, ur: 15 mg/dL — AB
Leukocytes,Ua: NEGATIVE
Nitrite: NEGATIVE
Protein, ur: NEGATIVE mg/dL
Specific Gravity, Urine: 1.01 (ref 1.005–1.030)
pH: 7 (ref 5.0–8.0)

## 2020-12-17 LAB — GLUCOSE, CAPILLARY
Glucose-Capillary: 123 mg/dL — ABNORMAL HIGH (ref 70–99)
Glucose-Capillary: 85 mg/dL (ref 70–99)
Glucose-Capillary: 97 mg/dL (ref 70–99)
Glucose-Capillary: 87 mg/dL (ref 70–99)
Glucose-Capillary: 108 mg/dL — ABNORMAL HIGH (ref 70–99)

## 2020-12-17 LAB — LIPID PANEL
Cholesterol: 136 mg/dL (ref 0–200)
HDL: 42 mg/dL (ref >40)
LDL Cholesterol: 79 mg/dL (ref 0–99)
Total CHOL/HDL Ratio: 3.2 RATIO
Triglycerides: 76 mg/dL (ref <150)
VLDL: 15 mg/dL (ref 0–40)

## 2020-12-17 LAB — RESP PANEL BY RT-PCR (FLU A&B, COVID) ARPGX2
Influenza A by PCR: NEGATIVE
Influenza B by PCR: NEGATIVE
SARS Coronavirus 2 by RT PCR: NEGATIVE

## 2020-12-17 LAB — CBC
HCT: 38.2 % (ref 36.0–46.0)
Hemoglobin: 12.4 g/dL (ref 12.0–15.0)
MCH: 27.4 pg (ref 26.0–34.0)
MCHC: 32.5 g/dL (ref 30.0–36.0)
MCV: 84.3 fL (ref 80.0–100.0)
Platelets: 252 10*3/uL (ref 150–400)
RBC: 4.53 MIL/uL (ref 3.87–5.11)
RDW: 13.3 % (ref 11.5–15.5)
WBC: 9.2 10*3/uL (ref 4.0–10.5)
nRBC: 0 % (ref 0.0–0.2)

## 2020-12-17 LAB — COMPREHENSIVE METABOLIC PANEL
ALT: 29 U/L (ref 0–44)
AST: 30 U/L (ref 15–41)
Albumin: 3.7 g/dL (ref 3.5–5.0)
Alkaline Phosphatase: 64 U/L (ref 38–126)
Anion gap: 9 (ref 5–15)
BUN: 9 mg/dL (ref 8–23)
CO2: 28 mmol/L (ref 22–32)
Calcium: 8.6 mg/dL — ABNORMAL LOW (ref 8.9–10.3)
Chloride: 103 mmol/L (ref 98–111)
Creatinine, Ser: 0.5 mg/dL (ref 0.44–1.00)
GFR, Estimated: >60 mL/min (ref >60)
Glucose, Bld: 105 mg/dL — ABNORMAL HIGH (ref 70–99)
Potassium: 2.6 mmol/L — CL (ref 3.5–5.1)
Sodium: 140 mmol/L (ref 135–145)
Total Bilirubin: 0.9 mg/dL (ref 0.3–1.2)
Total Protein: 5.9 g/dL — ABNORMAL LOW (ref 6.5–8.1)

## 2020-12-17 LAB — HEMOGLOBIN A1C
Hgb A1c MFr Bld: 6 % — ABNORMAL HIGH (ref 4.8–5.6)
Mean Plasma Glucose: 125.5 mg/dL

## 2020-12-17 LAB — PHOSPHORUS: Phosphorus: 3.1 mg/dL (ref 2.5–4.6)

## 2020-12-17 LAB — MAGNESIUM: Magnesium: 1.3 mg/dL — ABNORMAL LOW (ref 1.7–2.4)

## 2020-12-17 MED ORDER — ASPIRIN 81 MG PO CHEW
81.0000 mg | CHEWABLE_TABLET | Freq: Every day | ORAL | Status: DC
  Administered 2020-12-17 – 2020-12-22 (×6): 81 mg via ORAL
  Filled 2020-12-17 (×6): qty 1

## 2020-12-17 MED ORDER — BISOPROLOL FUMARATE 5 MG PO TABS
5.0000 mg | ORAL_TABLET | Freq: Every day | ORAL | Status: DC
  Administered 2020-12-17 – 2020-12-22 (×6): 5 mg via ORAL
  Filled 2020-12-17 (×6): qty 1

## 2020-12-17 MED ORDER — APIXABAN 5 MG PO TABS
5.0000 mg | ORAL_TABLET | Freq: Two times a day (BID) | ORAL | Status: DC
  Administered 2020-12-17 – 2020-12-22 (×11): 5 mg via ORAL
  Filled 2020-12-17 (×11): qty 1

## 2020-12-17 MED ORDER — MAGNESIUM SULFATE 2 GM/50ML IV SOLN
2.0000 g | Freq: Once | INTRAVENOUS | Status: AC
  Administered 2020-12-17: 2 g via INTRAVENOUS
  Filled 2020-12-17: qty 50

## 2020-12-17 MED ORDER — ACETAMINOPHEN 160 MG/5ML PO SOLN: 650.0000 mg | ORAL | Status: DC | PRN

## 2020-12-17 MED ORDER — ACETAMINOPHEN 650 MG RE SUPP: 650.0000 mg | RECTAL | Status: DC | PRN

## 2020-12-17 MED ORDER — ACETAMINOPHEN 325 MG PO TABS
650.0000 mg | ORAL_TABLET | ORAL | Status: DC | PRN
  Administered 2020-12-17: 650 mg via ORAL
  Filled 2020-12-17: qty 2

## 2020-12-17 MED ORDER — INSULIN ASPART 100 UNIT/ML IJ SOLN
0.0000 [IU] | INTRAMUSCULAR | Status: DC
  Administered 2020-12-18 (×2): 1 [IU] via SUBCUTANEOUS

## 2020-12-17 MED ORDER — POTASSIUM CHLORIDE 10 MEQ/100ML IV SOLN
10.0000 meq | INTRAVENOUS | Status: AC
  Administered 2020-12-17 (×4): 10 meq via INTRAVENOUS
  Filled 2020-12-17 (×3): qty 100

## 2020-12-17 MED ORDER — AMLODIPINE BESYLATE 5 MG PO TABS
2.5000 mg | ORAL_TABLET | Freq: Every day | ORAL | Status: DC
  Administered 2020-12-17 – 2020-12-18 (×2): 2.5 mg via ORAL
  Filled 2020-12-17 (×2): qty 1

## 2020-12-17 MED ORDER — ASPIRIN 300 MG RE SUPP: 300.0000 mg | Freq: Every day | RECTAL | Status: DC

## 2020-12-17 MED ORDER — ATORVASTATIN CALCIUM 40 MG PO TABS
80.0000 mg | ORAL_TABLET | Freq: Every day | ORAL | Status: DC
  Administered 2020-12-17 – 2020-12-22 (×6): 80 mg via ORAL
  Filled 2020-12-17 (×6): qty 2

## 2020-12-17 MED ORDER — POTASSIUM CHLORIDE 10 MEQ/100ML IV SOLN
10.0000 meq | INTRAVENOUS | Status: AC
  Administered 2020-12-17 (×3): 10 meq via INTRAVENOUS
  Filled 2020-12-17 (×3): qty 100

## 2020-12-17 MED ORDER — STROKE: EARLY STAGES OF RECOVERY BOOK: Freq: Once | Status: AC

## 2020-12-17 NOTE — Evaluation (Signed)
Physical Therapy Evaluation Patient Details Name: Alicia Guerrero MRN: [id number] DOB: 1935-07-31 Today's Date: 12/17/2020  History of Present Illness  Alicia Guerrero is an 85 y.o. female with medical history significant for  atrial fibrillation with biventricular implantable cardiac defibrillator, ischemic cardiomyopathy, hypertension, hyperlipidemia, chronic systolic heart failure, prior stroke with left-sided weakness and a cardiac arrest in September 2013 who presents to the emergency department due to increased left-sided weakness (from baseline) which started yesterday in the afternoon.  Patient was unable to provide history due to slurred speech, history was obtained from ED physician, ED medical record and son at bedside.  Per report, patient noted increased leg weakness around 4:30 PM when she tried to get up and she also noted increased left upper extremity from baseline.  Slurred speech was noted.  EMS was activated and patient was taken to the ED for further evaluation and management.  She denies chest pain, shortness of breath, headache, vision changes.   Clinical Impression  Patient limited for functional mobility as stated below secondary to BLE weakness, fatigue and poor standing balance. Patient with impaired sitting balance demonstrating posterior lean requiring assist to remain upright at EOB. She transfers to standing with cueing for sequencing and assist for hand placement on RW as well as LE weakness. Patient demonstrating poor standing balance. She is able to move foot minimally while standing but is unable to step at bedside secondary to c/o dizziness and needing to sit. Patient is normally independent and has had a significant decline in functional mobility. Patient will benefit from continued physical therapy in hospital and recommended venue below to increase strength, balance, endurance for safe ADLs and gait.        Recommendations for follow up therapy are one component of  a multi-disciplinary discharge planning process, led by the attending physician.  Recommendations may be updated based on patient status, additional functional criteria and insurance authorization.  Follow Up Recommendations Acute inpatient rehab (3hours/day)    Assistance Recommended at Discharge Frequent or constant Supervision/Assistance  Functional Status Assessment Patient has had a recent decline in their functional status and demonstrates the ability to make significant improvements in function in a reasonable and predictable amount of time.  Equipment Recommendations  None recommended by PT    Recommendations for Other Services       Precautions / Restrictions Precautions Precautions: Fall Restrictions Weight Bearing Restrictions: No      Mobility  Bed Mobility Overal bed mobility: Needs Assistance Bed Mobility: Supine to Sit;Sit to Supine     Supine to sit: Min assist;HOB elevated Sit to supine: Mod assist;Max assist   General bed mobility comments: Pt demosntrates very slow labored movement needing hand held assist and min A to move B LE during bed mobiliy.    Transfers Overall transfer level: Needs assistance Equipment used: Rolling walker (2 wheels) Transfers: Sit to/from Stand Sit to Stand: Min assist;Mod assist           General transfer comment: Patient requires cueing for sequencing, assist for hand placement on RW, and assist for LE weakness in order to transfer to standing with RW    Ambulation/Gait                  Stairs            Wheelchair Mobility    Modified Rankin (Stroke Patients Only)       Balance Overall balance assessment: Needs assistance Sitting-balance support: Bilateral upper extremity supported;Feet supported Sitting  balance-Leahy Scale: Poor Sitting balance - Comments: seated at EOB. Postural control: Posterior lean Standing balance support: Bilateral upper extremity supported;During functional  activity;Reliant on assistive device for balance Standing balance-Leahy Scale: Poor Standing balance comment: using RW                             Pertinent Vitals/Pain Pain Assessment: No/denies pain    Home Living Family/patient expects to be discharged to:: Private residence Living Arrangements: Spouse/significant other Available Help at Discharge: Family;Available PRN/intermittently Type of Home: House Home Access: Stairs to enter Entrance Stairs-Rails: None Entrance Stairs-Number of Steps: 2   Home Layout: One level Home Equipment: Conservation officer, nature (2 wheels);Wheelchair - manual;BSC/3in1 Additional Comments: Pt lives with husband but husband is unable to help very much due to own physical limitations. Son assists once a week per report.    Prior Function Prior Level of Function : Independent/Modified Independent             Mobility Comments: Pt reports indepdnent mobility mostly household without AD. ADLs Comments: Pt reports independence for ADL's with some assist for IADL's from son.     Hand Dominance   Dominant Hand: Left    Extremity/Trunk Assessment   Upper Extremity Assessment Upper Extremity Assessment: Defer to OT evaluation RUE Deficits / Details: General weakness. LUE Deficits / Details: 2+/5 shoulder MMT with poor gross motor coordination and fine motor. Pt able to demosntrate near [facility] elbow A/ROM but with slow coordination. 3+/5 MMT for composit grip. LUE Sensation: WNL LUE Coordination: decreased fine motor;decreased gross motor    Lower Extremity Assessment Lower Extremity Assessment: RLE deficits/detail;LLE deficits/detail RLE Deficits / Details: grossly 4+/5 LLE Deficits / Details: hip flexion 4/5, knee/ankle 4+/5       Communication   Communication: Expressive difficulties  Cognition Arousal/Alertness: Awake/alert Behavior During Therapy: WFL for tasks assessed/performed Overall Cognitive Status: Within Functional Limits for  tasks assessed                                 General Comments: Pt oreiented to place and able to answer all prior history questions.        General Comments      Exercises     Assessment/Plan    PT Assessment Patient needs continued PT services  PT Problem List Decreased strength;Decreased activity tolerance;Decreased balance;Decreased mobility;Decreased knowledge of use of DME       PT Treatment Interventions DME instruction;Balance training;Gait training;Neuromuscular re-education;Stair training;Functional mobility training;Patient/family education;Therapeutic activities;Therapeutic exercise;Manual techniques    PT Goals (Current goals can be found in the Care Plan section)  Acute Rehab PT Goals Patient Stated Goal: Return home PT Goal Formulation: With patient Time For Goal Achievement: 12/31/20 Potential to Achieve Goals: Fair    Frequency Min 4X/week   Barriers to discharge        Co-evaluation PT/OT/SLP Co-Evaluation/Treatment: Yes Reason for Co-Treatment: Complexity of the patient's impairments (multi-system involvement);To address functional/ADL transfers PT goals addressed during session: Mobility/safety with mobility;Balance;Proper use of DME;Strengthening/ROM OT goals addressed during session: ADL's and self-care;Strengthening/ROM       AM-PAC PT "6 Clicks" Mobility  Outcome Measure Help needed turning from your back to your side while in a flat bed without using bedrails?: A Little Help needed moving from lying on your back to sitting on the side of a flat bed without using bedrails?: A Lot Help needed moving  to and from a bed to a chair (including a wheelchair)?: A Lot Help needed standing up from a chair using your arms (e.g., wheelchair or bedside chair)?: A Lot Help needed to walk in hospital room?: A Lot Help needed climbing 3-5 steps with a railing? : Total 6 Click Score: 12    End of Session Equipment Utilized During Treatment:  Gait belt Activity Tolerance: Patient tolerated treatment well Patient left: in bed;with call bell/phone within reach;with bed alarm set Nurse Communication: Mobility status PT Visit Diagnosis: Unsteadiness on feet (R26.81);Other abnormalities of gait and mobility (R26.89);Muscle weakness (generalized) (M62.81);Other symptoms and signs involving the nervous system (R29.898)    Time: 7026-3785 PT Time Calculation (min) (ACUTE ONLY): 23 min   Charges:   PT Evaluation $PT Eval Moderate Complexity: 1 Mod          10:27 AM, 12/17/20 Mearl Latin PT, DPT Physical Therapist at [facility]

## 2020-12-17 NOTE — Progress Notes (Signed)
Patient seen and evaluated, chart reviewed, please see EMR for updated orders. Please see full H&P dictated by admitting physician Dr Josephine Cables for same date of service.    Brief Summary:- 85 y.o. female with medical history significant for  atrial fibrillation with biventricular implantable cardiac defibrillator, ischemic cardiomyopathy, hypertension, hyperlipidemia, chronic systolic heart failure, prior stroke with left-sided weakness and a cardiac arrest in September 2013 admitted on 12/17/2020 with increased weakness on the left side concerning for new stroke superimposed on previous strokes and deficits   A/p  1) worsening left-sided weakness concerning for new acute stroke superimposed on previous stroke-- -- CT head unrevealing -CTA head and neck without LVO, significant occlusion of the left M ICA M1 segment noted, along with short segment occlusion of the distal right PCA P2 segment -Unable to do MRI due to AICD in situ -Neurologic consult appreciated recommended repeat CT head after 24 hours -Increase atorvastatin to 80 mg daily for secondary stroke prevention -Continue Eliquis, neurology recommended adding aspirin 81 mg daily -BP goal 120 to 140 mmhg -patient has severe intracranial stenosis so avoid hypotension -LDL is 79 HDL is 42, T cholesterol 126  2) chronic atrial fibrillation--- continue Eliquis with secondary stroke prophylaxis, bisoprolol for rate control  3)H/o CAD--ischemic cardiomyopathy, -Chest pain-free, no ACS type symptoms  aspirin and Lipitor as above, continue bisoprolol  4)DM2--A1c 6.0 reflecting excellent diabetic control PTA -hold metformin Use Novolog/Humalog Sliding scale insulin with Accu-Cheks/Fingersticks as ordered   5) dysphagia----pathology consult appreciated recommends dysphagia 1 diet with nectar thickened liquids  6)HTN--permissive hypertension for 24 hours then -BP goal 120 to 140 mmhg -patient has severe intracranial stenosis so avoid  hypotension -Amlodipine and bisoprolol for now  -Total care time 43 minutes  -Patient seen and evaluated, chart reviewed, please see EMR for updated orders. Please see full H&P dictated by admitting physician Dr Josephine Cables for same date of service.   Roxan Hockey, MD     Roxan Hockey, MD

## 2020-12-17 NOTE — Evaluation (Signed)
Occupational Therapy Evaluation Patient Details Name: Alicia Guerrero MRN: [id number] DOB: [birth year] Today's Date: 12/17/2020   History of Present Illness Alicia Guerrero is an 85 y.o. female with medical history significant for  atrial fibrillation with biventricular implantable cardiac defibrillator, ischemic cardiomyopathy, hypertension, hyperlipidemia, chronic systolic heart failure, prior stroke with left-sided weakness and a cardiac arrest in September 2013 who presents to the emergency department due to increased left-sided weakness (from baseline) which started yesterday in the afternoon.  Patient was unable to provide history due to slurred speech, history was obtained from ED physician, ED medical record and son at bedside.  Per report, patient noted increased leg weakness around 4:30 PM when she tried to get up and she also noted increased left upper extremity from baseline.  Slurred speech was noted.  EMS was activated and patient was taken to the ED for further evaluation and management.  She denies chest pain, shortness of breath, headache, vision changes.   Clinical Impression   Pt agreeable to OT/PT co-evaluation. Pt reports being independent at baseline with mostly household mobility without AD. Pt reported that her previous stroke impacted her R side which is not what review of documentation implied. Document review indicates L UE was affected by previous strokes. Pt demonstrates possible mild L side inattention. Pt demonstrates limited L shoulder A/ROM below 50% of available range. Pt able to grip RW but with assist to coordinate L UE to locate the RW. Pt demonstrates poor seated balance with frequent posterior lean as well. Pt lives with husband but husband is "little help" due to his own medical issues. Pt will benefit from continued OT in the hospital and recommended venue below to increase strength, balance, and endurance for safe ADL's.         Recommendations for follow up  therapy are one component of a multi-disciplinary discharge planning process, led by the attending physician.  Recommendations may be updated based on patient status, additional functional criteria and insurance authorization.   Follow Up Recommendations  Acute inpatient rehab (3hours/day)    Assistance Recommended at Discharge Frequent or constant Supervision/Assistance  Functional Status Assessment  Patient has had a recent decline in their functional status and demonstrates the ability to make significant improvements in function in a reasonable and predictable amount of time.  Equipment Recommendations  None recommended by OT           Precautions / Restrictions Precautions Precautions: Fall Restrictions Weight Bearing Restrictions: No      Mobility Bed Mobility Overal bed mobility: Needs Assistance Bed Mobility: Supine to Sit;Sit to Supine     Supine to sit: Min assist;HOB elevated (slightly elevated) Sit to supine: Mod assist;Max assist   General bed mobility comments: Pt demosntrates very slow labored movement needing hand held assist and min A to move B LE during bed mobiliy.    Transfers Overall transfer level: Needs assistance Equipment used: Rolling walker (2 wheels) Transfers: Sit to/from Stand Sit to Stand: Min assist;Mod assist           General transfer comment: Pt demosntrates need for assist to boost from EOB to standing with RW. Assisted in placement of L UE on the walker. Pt stands for breif periods before reporting dizziness and need to sit. Pt able to pivot L foot partially when prompted to take a lateral step.      Balance Overall balance assessment: Needs assistance Sitting-balance support: Bilateral upper extremity supported;Feet supported Sitting balance-Leahy Scale: Poor Sitting balance - Comments:  seated at EOB. Postural control: Posterior lean Standing balance support: Bilateral upper extremity supported;During functional  activity;Reliant on assistive device for balance Standing balance-Leahy Scale: Poor Standing balance comment: using RW                           ADL either performed or assessed with clinical judgement   ADL Overall ADL's : Needs assistance/impaired Eating/Feeding: Set up;Minimal assistance;Sitting Eating/Feeding Details (indicate cue type and reason): Per clinical judgment. Grooming: Minimal assistance;Moderate assistance;Sitting Grooming Details (indicate cue type and reason): Per clinical judgment. Upper Body Bathing: Minimal assistance;Sitting;Min guard Upper Body Bathing Details (indicate cue type and reason): Per clinical judgment. Lower Body Bathing: Moderate assistance;Maximal assistance;Sitting/lateral leans Lower Body Bathing Details (indicate cue type and reason): Per clinical judgment. Upper Body Dressing : Min guard;Minimal assistance;Sitting Upper Body Dressing Details (indicate cue type and reason): Per clinical judgment. Lower Body Dressing: Moderate assistance;Maximal assistance;Sitting/lateral leans Lower Body Dressing Details (indicate cue type and reason): Per clinical judgment. Toilet Transfer: Moderate assistance;Rolling walker (2 wheels);Stand-pivot;Maximal assistance Toilet Transfer Details (indicate cue type and reason): Partially simulated via sit to stand and attempt for lateral steps. Toileting- Clothing Manipulation and Hygiene: Minimal assistance;Moderate assistance;Sitting/lateral lean Toileting - Clothing Manipulation Details (indicate cue type and reason): Per clinical judgment. Tub/ Shower Transfer: Maximal assistance;Stand-pivot;Rolling walker (2 wheels) Tub/Shower Transfer Details (indicate cue type and reason): Per clinical judgment. Functional mobility during ADLs: Moderate assistance;Maximal assistance;Rolling walker (2 wheels) General ADL Comments: Pt demonstrates slow movement with quick fatigue and report of dizziness and need to sit. Pt  limited by poor balance, postural control, and L UE functional use.     Vision Baseline Vision/History: 1 Wears glasses Ability to See in Adequate Light: 0 Adequate Patient Visual Report: No change from baseline Vision Assessment?: Yes Tracking/Visual Pursuits: Able to track stimulus in all quads without difficulty Convergence: Within functional limits Visual Fields: Other (comment) (Possible mild L side neglect or innatention as seen by mild R side tilt and mixed performance on visual field testing.)          Praxis Praxis Praxis-Other Comments: Possible deficits or mostly coordination issues.    Pertinent Vitals/Pain Pain Assessment: No/denies pain     Hand Dominance Left   Extremity/Trunk Assessment Upper Extremity Assessment Upper Extremity Assessment: LUE deficits/detail;RUE deficits/detail RUE Deficits / Details: General weakness. LUE Deficits / Details: 2+/5 shoulder MMT with poor gross motor coordination and fine motor. Pt able to demosntrate near [facility] elbow A/ROM but with slow coordination. 3+/5 MMT for composit grip. LUE Sensation: WNL LUE Coordination: decreased fine motor;decreased gross motor   Lower Extremity Assessment Lower Extremity Assessment: Defer to PT evaluation       Communication Communication Communication: Expressive difficulties   Cognition Arousal/Alertness: Awake/alert Behavior During Therapy: WFL for tasks assessed/performed Overall Cognitive Status: Within Functional Limits for tasks assessed                                 General Comments: Pt oreiented to place and able to anser all prior history questions.                Home Living Family/patient expects to be discharged to:: Private residence Living Arrangements: Spouse/significant other Available Help at Discharge: Family;Available PRN/intermittently Type of Home: House Home Access: Stairs to enter [employer] of Steps: 2 Entrance Stairs-Rails:  None Home Layout: One level  Bathroom Shower/Tub: Occupational psychologist: Standard     Home Equipment: Conservation officer, nature (2 wheels);Wheelchair - manual;BSC/3in1   Additional Comments: Pt lives with husband but husband is unable to help very much due to own physical limitations. Son assists once a week per report.      Prior Functioning/Environment Prior Level of Function : Independent/Modified Independent             Mobility Comments: Pt reports indepdnent mobility mostly household without AD. ADLs Comments: Pt reports independence for ADL's with some assist for IADL's from son.        OT Problem List: Decreased strength;Decreased range of motion;Decreased activity tolerance;Impaired balance (sitting and/or standing);Impaired vision/perception;Decreased coordination;Decreased knowledge of use of DME or AE;Impaired UE functional use      OT Treatment/Interventions: Self-care/ADL training;Therapeutic exercise;Therapeutic activities;Balance training;Patient/family education;Visual/perceptual remediation/compensation;Neuromuscular education;DME and/or AE instruction    OT Goals(Current goals can be found in the care plan section) Acute Rehab OT Goals Patient Stated Goal: return home OT Goal Formulation: With patient Time For Goal Achievement: 12/31/20 Potential to Achieve Goals: Fair  OT Frequency: Min 2X/week   Barriers to D/C:            Co-evaluation PT/OT/SLP Co-Evaluation/Treatment: Yes Reason for Co-Treatment: Complexity of the patient's impairments (multi-system involvement)   OT goals addressed during session: ADL's and self-care;Strengthening/ROM      AM-PAC OT "6 Clicks" Daily Activity     Outcome Measure Help from another person eating meals?: A Little Help from another person taking care of personal grooming?: A Lot Help from another person toileting, which includes using toliet, bedpan, or urinal?: A Lot Help from another person bathing  (including washing, rinsing, drying)?: A Lot Help from another person to put on and taking off regular upper body clothing?: A Little Help from another person to put on and taking off regular lower body clothing?: A Lot 6 Click Score: 14   End of Session Equipment Utilized During Treatment: Rolling walker (2 wheels)  Activity Tolerance: Patient tolerated treatment well Patient left: in bed;with call bell/phone within reach;with bed alarm set  OT Visit Diagnosis: Unsteadiness on feet (R26.81);Other abnormalities of gait and mobility (R26.89);History of falling (Z91.81);Muscle weakness (generalized) (M62.81);Hemiplegia and hemiparesis;Dizziness and giddiness (R42);Cognitive communication deficit (R41.841) Symptoms and signs involving cognitive functions:  (Unable to obtain MRI) Hemiplegia - Right/Left: Left Hemiplegia - dominant/non-dominant: Dominant Hemiplegia - caused by: Unspecified                Time: 8828-0034 OT Time Calculation (min): 35 min Charges:  OT General Charges $OT Visit: 1 Visit OT Evaluation $OT Eval Moderate Complexity: 1 Mod Carmen Vallecillo OT, MOT   Larey Seat 12/17/2020, 9:38 AM

## 2020-12-17 NOTE — Evaluation (Addendum)
Clinical/Bedside Swallow Evaluation Patient Details  Name: Alicia Guerrero MRN: [id number] Date of Birth: Feb 24, 1935  Today's Date: 12/17/2020 Time: SLP Start Time (ACUTE ONLY): 1110 SLP Stop Time (ACUTE ONLY): 1150 SLP Time Calculation (min) (ACUTE ONLY): 40 min  Past Medical History:  Past Medical History:  Diagnosis Date   Allergic rhinitis due to pollen    Asthma    Atrial fibrillation ([place])    Biventricular ICD (implantable cardiac defibrillator) in place    St Jude 10/2011 (implant MD: Allred)   Bradycardia    a. coreg tapered off 05/2011 => b. Metoprolol restarted after cardiac arrest 09/2011   CAD (coronary artery disease)    a. s/p stent to RCA 1996;  b. s/p cutting balloon PTCA to LAD 2001; c. s/p Cypher DES to LAD 2003;  d. NSTEMI 4/12: DES to dRCA;  e.  [place] 9/13 (after presentation with cardiac arrest and + Nz's for NSTEMI): pLAD 30% ISR, oD1 30%, dLAD 60-70%, pOM1 (small vessel) 90%, oOM2 95% (moderate to large vessel), mCFX 30%, pOM3 30-40%, dRCA stents and pPDA stents patent with 30% ISR => med Rx rec.    [facility])    Cardiac arrest ([place]) 09/2011   a. VT/VF in community; resusc unsuccessful;  PEA in ED; multiple defibs => revived;  [employer];  c/b by VDRF, cardiogenic shock;  LHC with stable anatomy => med Rx;  difficult ween from vent => s/p trach;  d/c to SNF   Chronic eczema    hands   Chronic systolic heart failure (H[facility])    Hemorrhoids    HLD (hyperlipidemia)    HTN (hypertension)    Ischemic cardiomyopathy    a. EF 40% at cath 4/12 with AL and Inf HK;  b.  Echo after presentation with cardiac arrest 9/13:  EF 40-45% and severe inf HK to AK c/w infarction, PASP 42   Left bundle branch block    Other and unspecified ovarian cyst    Other atopic dermatitis and related conditions    Personal history of hyperthyroidism    Personal history of malignant neoplasm of breast    Past Surgical History:  Past Surgical History:  Procedure Laterality Date    ABDOMINAL HYSTERECTOMY     BI-VENTRICULAR IMPLANTABLE CARDIOVERTER DEFIBRILLATOR N/A 11/02/2011   Procedure: BI-VENTRICULAR IMPLANTABLE CARDIOVERTER DEFIBRILLATOR  (CRT-D);  Surgeon: Thompson Grayer, MD;  Location[facility] CATH LAB;  Service: Cardiovascular;  Laterality: N/A;   BIV ICD GENERATOR CHANGEOUT N/A 05/11/2017   Procedure: BIV ICD GENERATOR CHANGEOUT;  Surgeon: [name], MD;  Location: [facility] CV LAB;  Service: Cardiovascular;  Laterality: N/A;   BREAST LUMPECTOMY  08/24/2003   right lumpectomy+sln,T2N0,ERPR+,Her2-   CHOLECYSTECTOMY     COLONOSCOPY     CORONARY STENT PLACEMENT  may and  july 2012   LEFT HEART CATHETERIZATION WITH CORONARY ANGIOGRAM N/A 10/09/2011   Procedure: LEFT HEART CATHETERIZATION WITH CORONARY ANGIOGRAM;  Surgeon: Larey Dresser, MD;  Location: [facility] CATH LAB;  Service: Cardiovascular;  Laterality: N/A;   OOPHORECTOMY     POLYPECTOMY     PTCA     VIDEO BRONCHOSCOPY  10/26/2011   Procedure: VIDEO BRONCHOSCOPY WITHOUT FLUORO;  Surgeon: Raylene Miyamoto, MD;  Location: [facility] ENDOSCOPY;  Service: Endoscopy;  Laterality: Bilateral;   HPI:  85 y.o. female with medical history significant for  atrial fibrillation with biventricular implantable cardiac defibrillator, ischemic cardiomyopathy, hypertension, hyperlipidemia, chronic systolic heart failure, prior stroke with left-sided weakness and a cardiac arrest in September  2013 who presents to the emergency department due to increased left-sided weakness (from baseline) which started yesterday in the afternoon.  Patient was unable to provide history due to slurred speech, history was obtained from ED physician, ED medical record and son at bedside.  Per report, patient noted increased leg weakness around 4:30 PM when she tried to get up and she also noted increased left upper extremity from baseline.  Slurred speech was noted.  EMS was activated and patient was taken to the ED for further evaluation and management.  She denies  chest pain, shortness of breath, headache, vision changes; CT head indicated on 12/16/20 negative for acute processes, but severe stenosis of L MCA; MRI ordered, but pending ability to perform d/t defibrillator; SLE/BSE generated    Assessment / Plan / Recommendation  Clinical Impression  Pt participated in clinical swallowing evaluation with oropharyngeal dysphagia noted characterized by L labial anterior loss, impaired mastication and reduced oral preparation/propulsion with reduced strength noted during OME and L asymmetry.  Pt with immediate cough noted with thin via cup sips but improved with volume reduced with straw (given SLP assistance) and small sips.  Puree presentation elicited multiple swallows and impaired mastication with soft solids with delayed cough noted after all intake completed.  MBSS recommended to recommend safest diet and develop dysphagia tx plan.  Thank you for this consult. SLP Visit Diagnosis: Dysphagia, oropharyngeal phase (R13.12)    Aspiration Risk  Mild aspiration risk;Moderate aspiration risk    Diet Recommendation   Dysphagia 1/thin liquids via small sips   Medication Administration: Crushed with puree    Other  Recommendations Oral Care Recommendations: Oral care BID    Recommendations for follow up therapy are one component of a multi-disciplinary discharge planning process, led by the attending physician.  Recommendations may be updated based on patient status, additional functional criteria and insurance authorization.  Follow up Recommendations Acute inpatient rehab (3hours/day)      Assistance Recommended at Discharge Frequent or constant Supervision/Assistance  Functional Status Assessment Patient has had a recent decline in their functional status and demonstrates the ability to make significant improvements in function in a reasonable and predictable amount of time.  Frequency and Duration min 2x/week  1 week       Prognosis Prognosis for Safe  Diet Advancement: Good Barriers to Reach Goals: Cognitive deficits      Swallow Study   General Date of Onset: 12/16/20 HPI: 85 y.o. female with medical history significant for  atrial fibrillation with biventricular implantable cardiac defibrillator, ischemic cardiomyopathy, hypertension, hyperlipidemia, chronic systolic heart failure, prior stroke with left-sided weakness and a cardiac arrest in September 2013 who presents to the emergency department due to increased left-sided weakness (from baseline) which started yesterday in the afternoon.  Patient was unable to provide history due to slurred speech, history was obtained from ED physician, ED medical record and son at bedside.  Per report, patient noted increased leg weakness around 4:30 PM when she tried to get up and she also noted increased left upper extremity from baseline.  Slurred speech was noted.  EMS was activated and patient was taken to the ED for further evaluation and management.  She denies chest pain, shortness of breath, headache, vision changes; CT head indicated on 12/16/20 negative; MRI pending ability to perform d/t defibrillator; SLE/BSE generated Type of Study: Bedside Swallow Evaluation Previous Swallow Assessment: n/a Diet Prior to this Study: NPO Temperature Spikes Noted: No Respiratory Status: Room air History of Recent Intubation:  No Behavior/Cognition: Alert;Cooperative;Distractible Oral Cavity Assessment: Dry Oral Care Completed by SLP: Recent completion by staff Oral Cavity - Dentition: Dentures, top;Dentures, bottom;Other (Comment) (wears bottom dentures occasionally, not for eating) Vision: Functional for self-feeding Self-Feeding Abilities: Able to feed self;Needs assist;Other (Comment) (dominant hand affected (left)) Patient Positioning: Upright in bed Baseline Vocal Quality: Low vocal intensity Volitional Cough: Strong Volitional Swallow: Able to elicit    Oral/Motor/Sensory Function Overall Oral  Motor/Sensory Function: Mild impairment Facial Symmetry: Abnormal symmetry left Facial Strength: Within Functional Limits Lingual Symmetry: Abnormal symmetry left Lingual Strength: Reduced Velum: Within Functional Limits   Ice Chips Ice chips: Impaired Presentation: Spoon Oral Phase Impairments: Reduced lingual movement/coordination Pharyngeal Phase Impairments: Suspected delayed Swallow   Thin Liquid Thin Liquid: Impaired Presentation: Cup;Straw Oral Phase Impairments: Reduced labial seal Oral Phase Functional Implications: Left anterior spillage Pharyngeal  Phase Impairments: Suspected delayed Swallow;Multiple swallows;Cough - Immediate;Cough - Delayed    Nectar Thick Nectar Thick Liquid: Not tested   Honey Thick Honey Thick Liquid: Not tested   Puree Puree: Impaired Presentation: Spoon Oral Phase Impairments: Reduced lingual movement/coordination Oral Phase Functional Implications: Prolonged oral transit Pharyngeal Phase Impairments: Suspected delayed Swallow;Multiple swallows;Cough - Delayed   Solid     Solid: Impaired Presentation: Self Fed Oral Phase Impairments: Reduced lingual movement/coordination;Impaired mastication Oral Phase Functional Implications: Impaired mastication;Prolonged oral transit Pharyngeal Phase Impairments: Multiple swallows;Cough - Delayed      Pat Artavious Trebilcock,M.S., CCC-SLP 12/17/2020,12:27 PM

## 2020-12-17 NOTE — Plan of Care (Signed)
  Problem: Acute Rehab PT Goals(only PT should resolve) Goal: Pt Will Go Supine/Side To Sit Outcome: Progressing Flowsheets (Taken 12/17/2020 1029) Pt will go Supine/Side to Sit: with minimal assist Goal: Pt Will Go Sit To Supine/Side Outcome: Progressing Flowsheets (Taken 12/17/2020 1029) Pt will go Sit to Supine/Side: with minimal assist Goal: Patient Will Perform Sitting Balance Outcome: Progressing Flowsheets (Taken 12/17/2020 1029) Patient will perform sitting balance: with supervision Goal: Patient Will Transfer Sit To/From Stand Outcome: Progressing [place] (Taken 12/17/2020 1029) Patient will transfer sit to/from stand: with minimal assist Goal: Pt Will Transfer Bed To Chair/Chair To Bed Outcome: Progressing Flowsheets (Taken 12/17/2020 1029) Pt will Transfer Bed to Chair/Chair to Bed: with min assist Goal: Pt/caregiver will Perform Home Exercise Program Outcome: Progressing Flowsheets (Taken 12/17/2020 1029) Pt/caregiver will Perform Home Exercise Program:  For increased strengthening  For improved balance  With Supervision, verbal cues required/provided  10:30 AM, 12/17/20 Mearl Latin PT, DPT Physical Therapist at [facility]

## 2020-12-17 NOTE — Plan of Care (Signed)

## 2020-12-17 NOTE — Progress Notes (Signed)
Patient off floor for procedure Neuro checks and vitals not taken.

## 2020-12-17 NOTE — Evaluation (Addendum)
Speech Language Pathology Evaluation Patient Details Name: Alicia Guerrero MRN: [id number] DOB: Aug 07, 1935 Today's Date: 12/17/2020 Time:  -     Problem List:  Patient Active Problem List   Diagnosis Date Noted   Acute ischemic stroke ([place]) 12/17/2020   Acute CVA (cerebrovascular accident) ([place]) 12/17/2020   Neoplasm of uncertain behavior of skin 06/10/2019   Vitamin D deficiency 06/10/2019   Vascular dementia ([place]) 07/04/2018   B12 deficiency 07/04/2018   Balance problem 06/25/2018   History of cardioembolic stroke 71/24/5809   Exhaustion delirium 06/25/2018   Balance disorder 06/25/2018   Acute bronchitis 07/02/2017   Type 2 diabetes mellitus ([place]) 05/14/2015   Intertriginous candidiasis 05/14/2015   Eczema 05/14/2015   Well adult exam 05/14/2014   Essential hypertension 01/28/2014   Hip pain 12/18/2013   Chronic renal failure 05/17/2013   Abnormal CT scan, chest 05/17/2013   Dyspnea 05/16/2013   Cough 05/06/2013   Cerebral infarction ([place]) 12/05/2012   Breast cancer ([place]) 08/01/2012    Diaphragmatic stimulation 98/33/8250   Chronic systolic heart failure ([place]) 12/20/2011   Long term (current) use of anticoagulants 11/10/2011   Ischemic cardiomyopathy 11/02/2011   Biventricular defibrillator-St. Jude 11/02/2011   Sinus bradycardia 10/01/2011   Ventricular fibrillation ([place]) 09/26/2011   Left bundle branch block 05/25/2010   CAROTID ARTERY STENOSIS, WITHOUT INFARCTION 09/15/2009   ADENOCARCINOMA, BREAST, HX OF 09/26/2008   Atrial fibrillation ([place]) 07/09/2008   HYPERLIPIDEMIA, MIXED 01/01/2007   CAD (coronary artery disease) 01/01/2007   Mild intermittent asthma in adult without complication 53/97/6734   HYPERTHYROIDISM, HX OF 01/01/2007   PERCUTANEOUS TRANSLUMINAL CORONARY ANGIOPLASTY, HX OF 01/01/2007   Past Medical History:  Past Medical History:  Diagnosis Date   Allergic rhinitis due to pollen    Asthma    Atrial fibrillation ([place])    Biventricular ICD  (implantable cardiac defibrillator) in place    St Jude 10/2011 (implant MD: Allred)   Bradycardia    a. coreg tapered off 05/2011 => b. Metoprolol restarted after cardiac arrest 09/2011   CAD (coronary artery disease)    a. s/p stent to RCA 1996;  b. s/p cutting balloon PTCA to LAD 2001; c. s/p Cypher DES to LAD 2003;  d. NSTEMI 4/12: DES to dRCA;  e.  [place] 9/13 (after presentation with cardiac arrest and + Nz's for NSTEMI): pLAD 30% ISR, oD1 30%, dLAD 60-70%, pOM1 (small vessel) 90%, oOM2 95% (moderate to large vessel), mCFX 30%, pOM3 30-40%, dRCA stents and pPDA stents patent with 30% ISR => med Rx rec.    Cancer [facility])    Cardiac arrest ([place]) 09/2011   a. VT/VF in community; resusc unsuccessful;  PEA in ED; multiple defibs => revived;  [employer];  c/b by VDRF, cardiogenic shock;  LHC with stable anatomy => med Rx;  difficult ween from vent => s/p trach;  d/c to SNF   Chronic eczema    hands   Chronic systolic heart failure (HCC)    Hemorrhoids    HLD (hyperlipidemia)    HTN (hypertension)    Ischemic cardiomyopathy    a. EF 40% at cath 4/12 with AL and Inf HK;  b.  Echo after presentation with cardiac arrest 9/13:  EF 40-45% and severe inf HK to AK c/w infarction, PASP 42   Left bundle branch block    Other and unspecified ovarian cyst    Other atopic dermatitis and related conditions    Personal history of hyperthyroidism    Personal history of  malignant neoplasm of breast    Past Surgical History:  Past Surgical History:  Procedure Laterality Date   ABDOMINAL HYSTERECTOMY     BI-VENTRICULAR IMPLANTABLE CARDIOVERTER DEFIBRILLATOR N/A 11/02/2011   Procedure: BI-VENTRICULAR IMPLANTABLE CARDIOVERTER DEFIBRILLATOR  (CRT-D);  Surgeon: Thompson Grayer, MD;  Location: [facility] CATH LAB;  Service: Cardiovascular;  Laterality: N/A;   BIV ICD GENERATOR CHANGEOUT N/A 05/11/2017   Procedure: BIV ICD GENERATOR CHANGEOUT;  Surgeon: [name], MD;  Location: [place] CV LAB;  Service:  Cardiovascular;  Laterality: N/A;   BREAST LUMPECTOMY  08/24/2003   right lumpectomy+sln,T2N0,ERPR+,Her2-   CHOLECYSTECTOMY     COLONOSCOPY     CORONARY STENT PLACEMENT  may and  july 2012   LEFT HEART CATHETERIZATION WITH CORONARY ANGIOGRAM N/A 10/09/2011   Procedure: LEFT HEART CATHETERIZATION WITH CORONARY ANGIOGRAM;  Surgeon: Larey Dresser, MD;  Location[facility] CATH LAB;  Service: Cardiovascular;  Laterality: N/A;   OOPHORECTOMY     POLYPECTOMY     PTCA     VIDEO BRONCHOSCOPY  10/26/2011   Procedure: VIDEO BRONCHOSCOPY WITHOUT FLUORO;  Surgeon: Raylene Miyamoto, MD;  Location: [facility] ENDOSCOPY;  Service: Endoscopy;  Laterality: Bilateral;   HPI:  85 y.o. female with medical history significant for  atrial fibrillation with biventricular implantable cardiac defibrillator, ischemic cardiomyopathy, hypertension, hyperlipidemia, chronic systolic heart failure, prior stroke with left-sided weakness and a cardiac arrest in September 2013 who presents to the emergency department due to increased left-sided weakness (from baseline) which started yesterday in the afternoon.  Patient was unable to provide history due to slurred speech, history was obtained from ED physician, ED medical record and son at bedside.  Per report, patient noted increased leg weakness around 4:30 PM when she tried to get up and she also noted increased left upper extremity from baseline.  Slurred speech was noted.  EMS was activated and patient was taken to the ED for further evaluation and management.  She denies chest pain, shortness of breath, headache, vision changes; CT head indicated on 12/16/20 negative; MRI pending ability to perform d/t defibrillator; SLE generated.  Assessment / Plan / Recommendation Clinical Impression  Pt evaluated via informal observation, SLUMS ([place] Mental status examination) with a score obtained with written portion eliminated of 18/26 with deficits noted within attention, memory  (recall of new information, short-term memory), and decreased overall processing of information.  Pt with dominant hand affected, so written formulation not assessed.  Reading tasks completed with simple words and environmental signs with no difficulty noted in decoding, but more complex reading tasks not assessed.  Pt demonstrated 75% accuracy with auditory comprehension task with simple paragraph, 3/5 words recalled after a time delay, but simple calculation, word fluency and repeating digits backwards difficult for patient.  Speech intelligibility judged to be 50-75% accurate with low vocal intensity, mild hypernasality quality to voice and imprecise articulation observed during simple conversation.  Naming tasks reduced for divergent/convergent tasks, but confrontational naming appeared [facility] with exception for reduced speech intelligibility overall.  Recommend ST continue for speech and cognitive/linguistic deficits and dysphagia.  Thank you for this consult.    SLP Assessment  SLP Recommendation/Assessment: Patient needs continued Speech Language Pathology Services SLP Visit Diagnosis: Dysphagia, oropharyngeal phase (R13.12);Dysarthria and anarthria (R47.1);Cognitive communication deficit (R41.841);Attention and concentration deficit Attention and concentration deficit following: Other cerebrovascular disease    Recommendations for follow up therapy are one component of a multi-disciplinary discharge planning process, led by the attending physician.  Recommendations may be updated  based on patient status, additional functional criteria and insurance authorization.    Follow Up Recommendations  Acute inpatient rehab (3hours/day)    Assistance Recommended at Discharge  Frequent or constant Supervision/Assistance  Functional Status Assessment Patient has had a recent decline in their functional status and demonstrates the ability to make significant improvements in function in a reasonable and  predictable amount of time.  Frequency and Duration min 2x/week  1 week      SLP Evaluation Cognition  Overall Cognitive Status: Impaired/Different from baseline Arousal/Alertness: Awake/alert Year: 2022 Month: December Day of Week: Correct Attention: Sustained Sustained Attention: Impaired Sustained Attention Impairment: Verbal basic;Functional basic Memory: Impaired Memory Impairment: Retrieval deficit;Decreased recall of new information;Decreased short term memory Decreased Short Term Memory: Verbal basic;Functional basic Awareness: Impaired Awareness Impairment: Emergent impairment;Intellectual impairment Problem Solving: Impaired Problem Solving Impairment: Verbal basic;Functional basic Executive Function: Organizing;Decision Conservation officer, historic buildings: Impaired Organizing Impairment: Verbal basic;Functional basic Decision Making: Impaired Decision Making Impairment: Verbal basic;Functional basic Behaviors: Impulsive Safety/Judgment: Impaired       Comprehension  Auditory Comprehension Overall Auditory Comprehension: Impaired Commands: Impaired Multistep Basic Commands: 25-49% accurate Conversation: Simple Interfering Components: Processing speed;Working Marine scientist;Attention EffectiveTechniques: Repetition;Extra processing time Visual Recognition/Discrimination Discrimination: Not tested Reading Comprehension Reading Status: Within funtional limits    Expression Expression Primary Mode of Expression: Verbal Verbal Expression Overall Verbal Expression: Impaired Level of Generative/Spontaneous Verbalization: Conversation Naming: Impairment Responsive: 76-100% accurate Confrontation: Within functional limits Convergent: 50-74% accurate Divergent: 50-74% accurate Interfering Components: Attention;Speech intelligibility Non-Verbal Means of Communication: Not applicable Written Expression Dominant Hand: Left Written Expression: Unable to assess (comment) (sominant hand  affected)   Oral / Motor  Oral Motor/Sensory Function Overall Oral Motor/Sensory Function: Mild impairment Facial Symmetry: Abnormal symmetry left Facial Strength: Within Functional Limits Lingual Symmetry: Abnormal symmetry left Lingual Strength: Reduced Velum: Within Functional Limits Motor Speech Overall Motor Speech: Appears within functional limits for tasks assessed Respiration: Impaired Level of Impairment: Conversation Phonation: Low vocal intensity Resonance: Hypernasality Articulation: Impaired Level of Impairment: Phrase Intelligibility: Intelligibility reduced Word: 50-74% accurate Phrase: 50-74% accurate Sentence: 25-49% accurate Conversation: 25-49% accurate Motor Planning: Not tested Motor Speech Errors: Not applicable Effective Techniques: Slow rate;Increased vocal intensity;Over-articulate                       Elvina Sidle, M.S., CCC-SLP 12/17/2020, 8:19 PM

## 2020-12-17 NOTE — Plan of Care (Signed)
  Problem: Acute Rehab OT Goals (only OT should resolve) Goal: Pt. Will Perform Eating Flowsheets (Taken 12/17/2020 0942) Pt Will Perform Eating:  with set-up  with adaptive utensils  sitting Goal: Pt. Will Perform Grooming Flowsheets (Taken 12/17/2020 0942) Pt Will Perform Grooming:  with min assist  sitting  with adaptive equipment  with min guard assist Goal: Pt. Will Perform Upper Body Bathing Flowsheets (Taken 12/17/2020 0942) Pt Will Perform Upper Body Bathing:  with min guard assist  with supervision  with adaptive equipment  sitting Goal: Pt. Will Perform Lower Body Bathing Flowsheets (Taken 12/17/2020 0942) Pt Will Perform Lower Body Bathing:  with min assist  with mod assist  with adaptive equipment  sitting/lateral leans Goal: Pt. Will Perform Upper Body Dressing Flowsheets (Taken 12/17/2020 0942) Pt Will Perform Upper Body Dressing:  with supervision  sitting  with adaptive equipment Goal: Pt. Will Perform Lower Body Dressing Flowsheets (Taken 12/17/2020 0942) Pt Will Perform Lower Body Dressing:  with min assist  sitting/lateral leans  with adaptive equipment  with mod assist Goal: Pt. Will Transfer To Toilet [place] (Taken 12/17/2020 [phone]) Pt Will Transfer to Toilet:  with min assist  stand pivot transfer Goal: Pt/Caregiver Will Perform Home Exercise Program Flowsheets (Taken 12/17/2020 [phone]) Pt/caregiver will Perform Home Exercise Program:  Increased ROM  Increased strength  Left upper extremity  With minimal assist  Terrian Ridlon OT, MOT

## 2020-12-17 NOTE — Progress Notes (Signed)
CODE STROKE  CALL TIME  2238 EMS IN ROUTE BEEPER TIME 2251 EXAM START  3299 EXAM END  2426 Alicia Guerrero   2257 Epic   2257 RAD CALLED  2258

## 2020-12-17 NOTE — TOC Initial Note (Signed)
Transition of Care [facility]) - Initial/Assessment Note    Patient Details  Name: Alicia Guerrero MRN: [id number] Date of Birth: 05-03-1935  Transition of [facility]) CM/SW Contact:    Shade Flood, LCSW Phone Number: 12/17/2020, 11:52 AM  Clinical Narrative:                  Pt admitted from home. PT/OT recommending CIR at dc. Awaiting CIR consult. If pt approved for CIR, will need auth. TOC will follow and if pt ends up needing SNF referral, will assist. Pt has PASRR number already assigned.  Expected Discharge Plan: IP Rehab Facility Barriers to Discharge: Continued Medical Work up, Ship broker   Patient Goals and CMS Choice Patient states their goals for this hospitalization and ongoing recovery are:: get better CMS Medicare.gov Compare Post Acute Care list provided to:: Patient Choice offered to / list presented to : Patient  Expected Discharge Plan and Services Expected Discharge Plan: [place] In-house Referral: Clinical Social Work   Post Acute Care Choice: IP Rehab Living arrangements for the past 2 months: Single Family Home                                      Prior Living Arrangements/Services Living arrangements for the past 2 months: Single Family Home Lives with:: Spouse Patient language and need for interpreter reviewed:: Yes Do you feel safe going back to the place where you live?: Yes      Need for Family Participation in Patient Care: No (Comment) Care giver support system in place?: Yes (comment)   Criminal Activity/Legal Involvement Pertinent to Current Situation/Hospitalization: No - Comment as needed  Activities of Daily Living Home Assistive Devices/Equipment: None ADL Screening (condition at time of admission) Patient's cognitive ability adequate to safely complete daily activities?: Yes Is the patient deaf or have difficulty hearing?: No Does the patient have difficulty seeing, even when wearing glasses/contacts?:  No Does the patient have difficulty concentrating, remembering, or making decisions?: No Patient able to express need for assistance with ADLs?: Yes Does the patient have difficulty dressing or bathing?: Yes Independently performs ADLs?: No Communication: Appropriate for developmental age Dressing (OT): Needs assistance Is this a change from baseline?: Pre-admission baseline Grooming: Needs assistance Is this a change from baseline?: Pre-admission baseline Feeding: Independent Bathing: Needs assistance Is this a change from baseline?: Change from baseline, expected to last >3 days Toileting: Needs assistance Is this a change from baseline?: Change from baseline, expected to last >3days In/Out Bed: Needs assistance Is this a change from baseline?: Change from baseline, expected to last >3 days Walks in Home: Independent Does the patient have difficulty walking or climbing stairs?: No Weakness of Legs: Left Weakness of Arms/Hands: Left  Permission Sought/Granted                  Emotional Assessment       Orientation: : Oriented to Self, Oriented to Place, Oriented to  Time, Oriented to Situation Alcohol / Substance Use: Not Applicable Psych Involvement: No (comment)  Admission diagnosis:  Acute ischemic stroke [facility]) [I63.9] Patient Active Problem List   Diagnosis Date Noted   Acute ischemic stroke ([place]) 12/17/2020   Neoplasm of uncertain behavior of skin 06/10/2019   Vitamin D deficiency 06/10/2019   Vascular dementia ([place]) 07/04/2018   B12 deficiency 07/04/2018   Balance problem 06/25/2018   History of cardioembolic stroke 94/76/5465  Exhaustion delirium 06/25/2018   Balance disorder 06/25/2018   Acute bronchitis 07/02/2017   Type 2 diabetes mellitus ([place]) 05/14/2015   Intertriginous candidiasis 05/14/2015   Eczema 05/14/2015   Well adult exam 05/14/2014   Essential hypertension 01/28/2014   Hip pain 12/18/2013   Chronic renal failure 05/17/2013   Abnormal  CT scan, chest 05/17/2013   Dyspnea 05/16/2013   Cough 05/06/2013   Cerebral infarction ([place]) 12/05/2012   Breast cancer ([place]) 08/01/2012    Diaphragmatic stimulation 46/96/2952   Chronic systolic heart failure ([place]) 12/20/2011   Long term (current) use of anticoagulants 11/10/2011   Ischemic cardiomyopathy 11/02/2011   Biventricular defibrillator-St. Jude 11/02/2011   Sinus bradycardia 10/01/2011   Ventricular fibrillation ([place]) 09/26/2011   Left bundle branch block 05/25/2010   CAROTID ARTERY STENOSIS, WITHOUT INFARCTION 09/15/2009   ADENOCARCINOMA, BREAST, HX OF 09/26/2008   Atrial fibrillation ([place]) 07/09/2008   HYPERLIPIDEMIA, MIXED 01/01/2007   CAD (coronary artery disease) 01/01/2007   Mild intermittent asthma in adult without complication 84/13/2440   HYPERTHYROIDISM, HX OF 01/01/2007   PERCUTANEOUS TRANSLUMINAL CORONARY ANGIOPLASTY, HX OF 01/01/2007   PCP:  [name], MD Pharmacy:   [facility] [address] [place] [address] [place]  [address] Phone: [phone] Fax: [phone]     Social Determinants of Health (SDOH) Interventions    Readmission Risk Interventions No flowsheet data found.

## 2020-12-17 NOTE — Progress Notes (Signed)
CRITICAL VALUE STICKER  CRITICAL VALUE: K+ 2.6  RECEIVER (on-site recipient of call): Rubbie Battiest, RN  [place] NOTIFIED: 12/17/2020 5498  MD NOTIFIED: Dr. Josephine Cables  TIME OF NOTIFICATION: 12/17/2020 2641

## 2020-12-17 NOTE — Progress Notes (Signed)
Inpatient Rehab Admissions Coordinator:  Consult received. Note pt is under observation status at this time. Pt may not have medical necessity to warrant an inpatient rehab stay if they remain observation. If status were to change to inpatient, [facility] will assess for candidacy.  Gayland Curry, [place], [place] Admissions Coordinator [phone]

## 2020-12-17 NOTE — H&P (Signed)
History and Physical  Alicia Guerrero YNW:[id number] DOB: [birth year] DOA: 12/16/2020  Referring physician: Sherwood Gambler  PCP: [name], MD  Patient coming from: Home  Chief Complaint: Code stroke  HPI: [name] Record is an 85 y.o. female with medical history significant for  atrial fibrillation with biventricular implantable cardiac defibrillator, ischemic cardiomyopathy, hypertension, hyperlipidemia, chronic systolic heart failure, prior stroke with left-sided weakness and a cardiac arrest in September 2013 who presents to the emergency department due to increased left-sided weakness (from baseline) which started yesterday in the afternoon.  Patient was unable to provide history due to slurred speech, history was obtained from ED physician, ED medical record and son at bedside.  Per report, patient noted increased leg weakness around 4:30 PM when she tried to get up and she also noted increased left upper extremity from baseline.  Slurred speech was noted.  EMS was activated and patient was taken to the ED for further evaluation and management.  She denies chest pain, shortness of breath, headache, vision changes.  ED Course:  In the emergency department, BP was 154/92 and other vital signs were within normal range.  Work-up in the ED showed normal CBC and BMP except for hypokalemia.  Alcohol level was negative, urine drug screen and urinalysis were negative.  Influenza A, B, SARS coronavirus 2 was negative. CT of head without contrast showed no acute intracranial abnormality CT angiography of head and neck showed no emergent large vessel occlusion but showed severe stenosis of the left MCA M1 segment.  Short segment occlusion of the distal right PCA P2 segment. Teleneurology was consulted and recommended further stroke work-up per ED physician.  Hospitalist was asked to admit patient for further evaluation and management.  Review of Systems: Constitutional: Negative for chills and  fever.  HENT: Negative for ear pain and sore throat.   Eyes: Negative for pain and visual disturbance.  Respiratory: Negative for cough, chest tightness and shortness of breath.   Cardiovascular: Negative for chest pain and palpitations.  Gastrointestinal: Negative for abdominal pain and vomiting.  Endocrine: Negative for polyphagia and polyuria.  Genitourinary: Negative for decreased urine volume, dysuria, enuresis Musculoskeletal: Negative for arthralgias and back pain.  Skin: Negative for color change and rash.  Allergic/Immunologic: Negative for immunocompromised state.  Neurological: Positive for speech difficulty, increased left-sided weakness.  Negative for tremors, syncope Hematological: Does not bruise/bleed easily.  All other systems reviewed and are negative   Past Medical History:  Diagnosis Date   Allergic rhinitis due to pollen    Asthma    Atrial fibrillation ([place])    Biventricular ICD (implantable cardiac defibrillator) in place    St Jude 10/2011 (implant MD: Allred)   Bradycardia    a. coreg tapered off 05/2011 => b. Metoprolol restarted after cardiac arrest 09/2011   CAD (coronary artery disease)    a. s/p stent to RCA 1996;  b. s/p cutting balloon PTCA to LAD 2001; c. s/p Cypher DES to LAD 2003;  d. NSTEMI 4/12: DES to dRCA;  e.  [place] 9/13 (after presentation with cardiac arrest and + Nz's for NSTEMI): pLAD 30% ISR, oD1 30%, dLAD 60-70%, pOM1 (small vessel) 90%, oOM2 95% (moderate to large vessel), mCFX 30%, pOM3 30-40%, dRCA stents and pPDA stents patent with 30% ISR => med Rx rec.    Cancer [facility])    Cardiac arrest ([place]) 09/2011   a. VT/VF in community; resusc unsuccessful;  PEA in ED; multiple defibs => revived;  [employer];  c/b by VDRF, cardiogenic shock;  LHC with stable anatomy => med Rx;  difficult ween from vent => s/p trach;  d/c to SNF   Chronic eczema    hands   Chronic systolic heart failure (HCC)    Hemorrhoids    HLD (hyperlipidemia)    HTN  (hypertension)    Ischemic cardiomyopathy    a. EF 40% at cath 4/12 with AL and Inf HK;  b.  Echo after presentation with cardiac arrest 9/13:  EF 40-45% and severe inf HK to AK c/w infarction, PASP 42   Left bundle branch block    Other and unspecified ovarian cyst    Other atopic dermatitis and related conditions    Personal history of hyperthyroidism    Personal history of malignant neoplasm of breast    Past Surgical History:  Procedure Laterality Date   ABDOMINAL HYSTERECTOMY     BI-VENTRICULAR IMPLANTABLE CARDIOVERTER DEFIBRILLATOR N/A 11/02/2011   Procedure: BI-VENTRICULAR IMPLANTABLE CARDIOVERTER DEFIBRILLATOR  (CRT-D);  Surgeon: Thompson Grayer, MD;  Location: [facility] CATH LAB;  Service: Cardiovascular;  Laterality: N/A;   BIV ICD GENERATOR CHANGEOUT N/A 05/11/2017   Procedure: BIV ICD GENERATOR CHANGEOUT;  Surgeon: [name], MD;  Location: [place] CV LAB;  Service: Cardiovascular;  Laterality: N/A;   BREAST LUMPECTOMY  08/24/2003   right lumpectomy+sln,T2N0,ERPR+,Her2-   CHOLECYSTECTOMY     COLONOSCOPY     CORONARY STENT PLACEMENT  may and  july 2012   LEFT HEART CATHETERIZATION WITH CORONARY ANGIOGRAM N/A 10/09/2011   Procedure: LEFT HEART CATHETERIZATION WITH CORONARY ANGIOGRAM;  Surgeon: Larey Dresser, MD;  Location: [facility] CATH LAB;  Service: Cardiovascular;  Laterality: N/A;   OOPHORECTOMY     POLYPECTOMY     PTCA     VIDEO BRONCHOSCOPY  10/26/2011   Procedure: VIDEO BRONCHOSCOPY WITHOUT FLUORO;  Surgeon: Raylene Miyamoto, MD;  Location: [facility] ENDOSCOPY;  Service: Endoscopy;  Laterality: Bilateral;    Social History:  reports that she has never smoked. She has never used smokeless tobacco. She reports that she does not drink alcohol and does not use drugs.   Allergies  Allergen Reactions   Coreg [Carvedilol] Other (See Comments)    Severe wheezing    [name] [Trandolapril] Cough   Meperidine Hcl Swelling and Other (See Comments)    Mixed with phenergan, swelling of  the mouth    Penicillins Other (See Comments)    Pt has taken penicillin about 6 years ago with no side effects   Molds & Smuts Other (See Comments)    Runny nose   Tape Other (See Comments)    Redness, pulls skin off   Tetanus Toxoids Rash    Family History  Problem Relation Age of Onset   Uterine cancer Mother    Hypertension Mother    Coronary artery disease Father    Heart disease Brother    Heart disease Sister    Endometrial cancer Sister    Heart disease Brother    Clotting disorder Brother        blood clot   Colon cancer Neg Hx      Prior to Admission medications   Medication Sig Start Date End Date Taking? Authorizing Provider  ELIQUIS 5 MG TABS tablet Take 1 tablet by mouth twice daily 11/29/20   [name], MD  atorvastatin (LIPITOR) 40 MG tablet Take 1 tablet by mouth once daily 09/29/20   [name], [name], MD  bisoprolol (ZEBETA) 5 MG tablet Take 1 tablet (  5 mg total) by mouth 2 (two) times daily. 09/29/20   [name], [name], MD  Blood Glucose Monitoring Suppl (ONE TOUCH ULTRA 2) w/Device KIT Use to check blood sugars daily Dx e11.9 04/03/16   [name], [name], MD  cetirizine (ZYRTEC) 10 MG tablet Take 1 tablet (10 mg total) by mouth as needed for allergies. Patient taking differently: Take 10 mg by mouth daily. 05/19/16   [name], [name], MD  Cholecalciferol (VITAMIN D3) 50 MCG (2000 UT) capsule Take 1 capsule (2,000 Units total) by mouth daily. 05/21/19   [name], [name], MD  Cyanocobalamin 1000 MCG SUBL Place 1 tablet (1,000 mcg total) under the tongue daily. 05/21/19   [name], [name], MD  doxycycline (VIBRA-TABS) 100 MG tablet Take 1 tablet (100 mg total) by mouth 2 (two) times daily. 06/10/19   [name], [name], MD  glucose blood (ONE TOUCH ULTRA TEST) test strip 1 each by Other route daily as needed for other. Use to check blood sugars twice a day Dx E11.9 05/15/16   [name], [name], MD  Lancets [facility] ULTRASOFT) lancets 1 each  by Other route 2 (two) times daily. Use to check blood sugars twice a day Dx E11.9 05/15/16   [name], [name], MD  metFORMIN (GLUCOPHAGE) 500 MG tablet Take 1 tablet (500 mg total) by mouth daily with breakfast. 09/29/20   [name], [name], MD  Multiple Vitamins-Minerals (PRESERVISION AREDS PO) Take 1 capsule by mouth 2 (two) times daily.    [provider]  nitroGLYCERIN (NITROSTAT) 0.4 MG SL tablet Place 1 tablet (0.4 mg total) under the tongue every 5 (five) minutes x 3 doses as needed for chest pain. Chest pain 02/20/19   [name], MD  Respiratory Therapy Supplies (NEBULIZER) DEVI Use with solution Q6H PRN wheezing or shortness of breath 07/10/17   [name], [name], MD  spironolactone (ALDACTONE) 25 MG tablet Take 1 tablet (25 mg total) by mouth daily. 05/12/20   [name], MD  telmisartan (MICARDIS) 40 MG tablet Take 1 tablet by mouth once daily 09/22/20   [name], [name], MD  triamcinolone ointment (KENALOG) 0.5 % Apply 1 application topically 2 (two) times daily. On the leg rash 07/31/19   [name], [name], MD    Physical Exam: BP (!) 181/82 (BP Location: Right Wrist)   Pulse 69   Temp 97.9 F (36.6 C) (Oral)   Resp 17   Ht 5' 2.99" (1.6 m)   Wt 62.8 kg   SpO2 96%   BMI 24.53 kg/m   General: 85 y.o. year-old female well developed well nourished in no acute distress.  Alert and oriented x3. HEENT: NCAT, EOMI Neck: Supple, trachea medial Cardiovascular: Irregular rate and rhythm with no rubs or gallops.  No thyromegaly or JVD noted.  No lower extremity edema. 2/4 pulses in all 4 extremities. Respiratory: Clear to auscultation with no wheezes or rales. Good inspiratory effort. Abdomen: Soft, nontender nondistended with normal bowel sounds x4 quadrants. Muskuloskeletal: No cyanosis, clubbing or edema noted bilaterally Neuro: CN II-XII intact, left-sided weakness noted.  Unable to perform heel-to-shin and finger-to-nose movements on left side.   Sensation, reflexes intact Skin: No ulcerative lesions noted or rashes Psychiatry: Mood is appropriate for condition and setting          Labs on Admission:  Basic Metabolic Panel: Recent Labs  Lab 12/16/20 2252  NA 141  K 2.7*  CL 101  CO2 29  GLUCOSE 112*  BUN 13  CREATININE 0.69  CALCIUM 9.1   Liver Function Tests: Recent Labs  Lab 12/16/20 2252  AST 32  ALT 30  ALKPHOS 67  BILITOT 1.0  PROT 6.3*  ALBUMIN 3.9   No results for input(s): LIPASE, AMYLASE in the last 168 hours. No results for input(s): AMMONIA in the last 168 hours. CBC: Recent Labs  Lab 12/16/20 2252  WBC 9.7  NEUTROABS 6.0  HGB 12.9  HCT 39.4  MCV 85.5  PLT 270   Cardiac Enzymes: No results for input(s): CKTOTAL, CKMB, CKMBINDEX, TROPONINI in the last 168 hours.  BNP (last 3 results) No results for input(s): BNP in the last 8760 hours.  ProBNP (last 3 results) No results for input(s): PROBNP in the last 8760 hours.  CBG: No results for input(s): GLUCAP in the last 168 hours.  Radiological Exams on Admission: CT HEAD CODE STROKE WO CONTRAST  Result Date: 12/16/2020 CLINICAL DATA:  Code stroke.  Acute neurologic deficit EXAM: CT HEAD WITHOUT CONTRAST TECHNIQUE: Contiguous axial images were obtained from the base of the skull through the vertex without intravenous contrast. COMPARISON:  06/25/2018 FINDINGS: Brain: There is no mass, hemorrhage or extra-axial collection. There is generalized atrophy without lobar predilection. There is hypoattenuation of the periventricular white matter, most commonly indicating chronic ischemic microangiopathy. There is an old left thalamic small vessel infarct. Vascular: No abnormal hyperdensity of the major intracranial arteries or dural venous sinuses. No intracranial atherosclerosis. Skull: The visualized skull base, calvarium and extracranial soft tissues are normal. Sinuses/Orbits: No fluid levels or advanced mucosal thickening of the visualized paranasal  sinuses. No mastoid or middle ear effusion. The orbits are normal. ASPECTS [facility] Stroke Program Early CT Score) - Ganglionic level infarction (caudate, lentiform nuclei, internal capsule, insula, M1-M3 cortex): 7 - Supraganglionic infarction (M4-M6 cortex): 3 Total score (0-10 with 10 being normal): 10 IMPRESSION: 1. No acute intracranial abnormality. 2. ASPECTS is 10. These results were called by telephone at the time of interpretation on 12/16/2020 at 11:03 pm to provider Sherwood Gambler , who verbally acknowledged these results. Electronically Signed   By: Ulyses Jarred M.D.   On: 12/16/2020 23:03   CT ANGIO HEAD NECK W WO CM W PERF (CODE STROKE)  Result Date: 12/17/2020 CLINICAL DATA:  Left-sided weakness EXAM: CT ANGIOGRAPHY HEAD AND NECK CT PERFUSION BRAIN TECHNIQUE: Multidetector CT imaging of the head and neck was performed using the standard protocol during bolus administration of intravenous contrast. Multiplanar CT image reconstructions and MIPs were obtained to evaluate the vascular anatomy. Carotid stenosis measurements (when applicable) are obtained utilizing NASCET criteria, using the distal internal carotid diameter as the denominator. Multiphase CT imaging of the brain was performed following IV bolus contrast injection. Subsequent parametric perfusion maps were calculated using RAPID software. CONTRAST:  164m OMNIPAQUE IOHEXOL 350 MG/ML SOLN COMPARISON:  None. FINDINGS: CT HEAD FINDINGS Brain: There is no mass, hemorrhage or extra-axial collection. The size and configuration of the ventricles and extra-axial CSF spaces are normal. There is no acute or chronic infarction. The brain parenchyma is normal. Skull: The visualized skull base, calvarium and extracranial soft tissues are normal. Sinuses/Orbits: No fluid levels or advanced mucosal thickening of the visualized paranasal sinuses. No mastoid or middle ear effusion. The orbits are normal. CTA NECK FINDINGS SKELETON: There is no bony spinal  canal stenosis. No lytic or blastic lesion. OTHER NECK: Normal pharynx, larynx and major salivary glands. No cervical lymphadenopathy. Unremarkable thyroid gland. UPPER CHEST: No pneumothorax or pleural effusion. No nodules or masses.  AORTIC ARCH: There is calcific atherosclerosis of the aortic arch. There is no aneurysm, dissection or hemodynamically significant stenosis of the visualized portion of the aorta. Conventional 3 vessel aortic branching pattern. The visualized proximal subclavian arteries are widely patent. RIGHT CAROTID SYSTEM: No dissection, occlusion or aneurysm. Mild atherosclerotic calcification at the carotid bifurcation without hemodynamically significant stenosis. LEFT CAROTID SYSTEM: No dissection, occlusion or aneurysm. Mild atherosclerotic calcification at the carotid bifurcation without hemodynamically significant stenosis. VERTEBRAL ARTERIES: Left dominant configuration. Both origins are clearly patent. There is no dissection, occlusion or flow-limiting stenosis to the skull base (V1-V3 segments). CTA HEAD FINDINGS POSTERIOR CIRCULATION: --Vertebral arteries: Normal V4 segments. --Inferior cerebellar arteries: Normal. --Basilar artery: Normal. --Superior cerebellar arteries: Normal. --Posterior cerebral arteries (PCA): Short segment occlusion of the distal right P2 segment (series 9, image 19). ANTERIOR CIRCULATION: --Intracranial internal carotid arteries: Atherosclerotic calcification of the internal carotid arteries at the skull base without hemodynamically significant stenosis. --Anterior cerebral arteries (ACA): Normal. Both A1 segments are present. Patent anterior communicating artery (a-comm). --Middle cerebral arteries (MCA): Severe stenosis of the left MCA M1 segment. VENOUS SINUSES: As permitted by contrast timing, patent. ANATOMIC VARIANTS: Fetal origin of the left posterior cerebral artery. Review of the MIP images confirms the above findings. CT Brain Perfusion Findings:  ASPECTS: 10 CBF (<30%) Volume: 88m Perfusion (Tmax>6.0s) volume: 067mMismatch Volume: 58m62mnfarction Location:None IMPRESSION: 1. No emergent large vessel occlusion. 2. Severe stenosis of the left MCA M1 segment. 3. Short segment occlusion of the distal right PCA P2 segment. Aortic Atherosclerosis (ICD10-I70.0). Electronically Signed   By: KevUlyses JarredD.   On: 12/17/2020 00:00    EKG: I independently viewed the EKG done and my findings are as followed: Junctional rhythm at a rate of 60 bpm.  QTc 495 ms  Assessment/Plan Present on Admission:  Acute ischemic stroke (HCCFergusonAtrial fibrillation (HCCMowerEssential hypertension  HYPERLIPIDEMIA, MIXED  Principal Problem:   Acute ischemic stroke ([place]CC[place] Problems:   HYPERLIPIDEMIA, MIXED   CAD (coronary artery disease)   Atrial fibrillation ([place] Ischemic cardiomyopathy   Essential hypertension  Acute ischemic stroke Patient has history of frequent strokes and weak left-sided weakness at baseline, however she complained of worsening left-sided weakness and slurred speech. CT angiography of head and neck showed no emergent large vessel occlusion but showed severe stenosis of the left MCA M1 segment.  Short segment occlusion of the distal right PCA P2 segment. Patient will be admitted to telemetry unit  Echocardiogram in the morning MRI of brain without contrast in the morning Continue statin and Eliquis s/p MRI Continue fall precautions and neuro checks Lipid panel and hemoglobin A1c will be checked Continue PT/SLP/OT eval and treat Bedside swallow eval by nursing prior to diet Neurology will be consulted and we shall await further recommendation  Atrial fibrillation Continue home meds once determined that patient can tolerate oral intake  Hypokalemia K+ is 2.7 K+ will be replenished Please monitor for AM K+ for further replenishmemnt  Essential hypertension  Antihypertensives PRN if Blood pressure is greater than 220/120 or  there is a concern for End organ damage/contraindications for permissive HTN. If blood pressure is greater than 220/120 give labetalol PO or IV or Vasotec IV with a goal of 15% reduction in BP during the first 24 hours.  Mixed hyperlipidemia Continue home meds once determined that patient can tolerate oral intake  CAD with ischemic cardiomyopathy Continue home meds once determined that patient can tolerate oral intake  Type  2 diabetes mellitus A1c in July 2021 was 7.0 Continue ISS and hypoglycemic protocol  DVT prophylaxis: SCDs  Code Status: Full code  Family Communication: Son at bedside (all questions answered to satisfaction)  Disposition Plan:  Patient is from:                        home Anticipated DC to:                   SNF or family members home Anticipated DC date:               2-3 days Anticipated DC barriers:         Patient requires inpatient management due to acute stroke pending neurology consult and further stroke work-up   Consults called: Neurology  Admission status: Observation    Bernadette Hoit MD [facility]  12/17/2020, 5:04 AM

## 2020-12-17 NOTE — Consult Note (Signed)
[facility] Tele[facility]urology Consult Services   Patient Name:   Alicia Guerrero, Alicia Guerrero Date of Birth:   15-Sep-1935 Identification Number:   MRN - [id number] Date of Service:   12/16/2020 23:07:09  Diagnosis:       I63.9 - Cerebrovascular accident (CVA), unspecified mechanism ([place])  Impression:      Alicia Guerrero is a 85 yo F w/ a hx of CAD and stroke who presents with left sided weakness. Here, exam is notable for dysathria and left hemiparesis. Head CT showed no acute intracranial pathology. CTA revealed a R P2 occlusion. Presentation concerning for acute ischemic stroke. Not eligible for tPA given Eliquis. Location of occlusion + degree of deficits are not compatible with thrombectomy. Recommend the following:  - Hold Eliquis until MRI brain w/o contrast  - Permissive HTN (SBP <220) until 1630 on 12/2  - NPO until SLP eval  - MRI brain w/o contrast  -Telemetry  -TTE  -hemoglobin A1c, lipid panel  -PT/OT/SLP  -Neurology to follow      Metrics: Last Known Well: 12/16/2020 16:30:00 [facility] Notification Time: 12/16/2020 23:07:09 Arrival Time: 12/16/2020 22:48:00 Stamp Time: 12/16/2020 23:07:09 Initial Response Time: 12/16/2020 23:10:59 Symptoms: left sided weakness. NIHSS Start Assessment Time: 12/16/2020 23:13:15 Patient is not a candidate for Thrombolytic. Thrombolytic Medical Decision: 12/16/2020 23:16:00 Patient was not deemed candidate for Thrombolytic because of following reasons: Use of NOAs within 48 hours.  CT head showed no acute hemorrhage or acute core infarct.  ED Physician notified of diagnostic impression and management plan on 12/16/2020 23:40:00  Our recommendations are outlined below.  Recommendations:        Stroke/Telemetry Floor       Neuro Checks       Bedside Swallow Eval       DVT Prophylaxis       IV Fluids, Normal Saline       Head of Bed 30 Degrees       Euglycemia and Avoid Hyperthermia (PRN Acetaminophen)  Routine Consultation with [facility] for Follow up Care  Sign Out:       Discussed with Emergency Department Provider    ------------------------------------------------------------------------------  History of Present Illness: Patient is a 85 year old Female.  Patient was brought by EMS for symptoms of left sided weakness.  Alicia Guerrero is a 85 yo F w/ a hx of CAD and stroke who presents with left sided weakness. LKN at 1630. Around that time, developed left-sided weakness and trouble speaking. Here, exam is notable for dysathria and left hemiparesis. Head CT showed no acute intracranial pathology. CTA revealed a R P2 occlusion.   Past Medical History:      Coronary Artery Disease      Stroke  Social History: Smoking: No Alcohol Use: No Drug Use: No  Family History:Unable to obtain due to Patient Status  Review of System:  14 Points Review of Systems was performed and was negative except mentioned in HPI.  Anticoagulant use:  Yes Eliquis  No Antiplatelet use  Allergies:  Reviewed    Examination: BP(181/82), Pulse(67), Blood Glucose(122) 1A: Level of Consciousness - Alert; keenly responsive + 0 1B: Ask Month and Age - Both Questions Right + 0 1C: Blink Eyes & Squeeze Hands - Performs Both Tasks + 0 2: Test Horizontal Extraocular Movements - Normal + 0 3: Test Visual Fields - No Visual Loss + 0 4: Test Facial Palsy (Use Grimace if Obtunded) - Normal symmetry + 0 5A: Test Left Arm Motor Drift - Drift, but doesn't  hit bed + 1 5B: Test Right Arm Motor Drift - No Drift for 10 Seconds + 0 6A: Test Left Leg Motor Drift - Drift, but doesn't hit bed + 1 6B: Test Right Leg Motor Drift - No Drift for 5 Seconds + 0 7: Test Limb Ataxia (FNF/Heel-Shin) - No Ataxia + 0 8: Test Sensation - Normal; No sensory loss + 1 9: Test Language/Aphasia - Normal; No aphasia + 0 10: Test Dysarthria - Mild-Moderate Dysarthria: Slurring but can be understood + 1 11: Test Extinction/Inattention - No abnormality +  0  NIHSS Score: 4   Pre-Morbid Modified Rankin Scale: 0 Points = No symptoms at all   Patient/Family was informed the Neurology Consult would occur via TeleHealth consult by way of interactive audio and video telecommunications and consented to receiving care in this manner.   Patient is being evaluated for possible acute neurologic impairment and high probability of imminent or life-threatening deterioration. I spent total of 29 minutes providing care to this patient, including time for face to face visit via telemedicine, review of medical records, imaging studies and discussion of findings with providers, the patient and/or family.   Dr Abbe Amsterdam   [facility] [phone]  Case [id number]

## 2020-12-17 NOTE — Consult Note (Addendum)
I connected with  [name] on 12/17/20 by a video enabled telemedicine application and verified that I am speaking with the correct person using two identifiers.   I discussed the limitations of evaluation and management by telemedicine. The patient expressed understanding and agreed to proceed.  Location of patient: [facility] Location of physician: [facility]   Neurology Consultation Reason for Consult: Suspected stroke Referring Physician: Dr. Roxan Hockey  CC: Worsening left-sided weakness  History is obtained from: Chart review, patient  HPI: Alicia Guerrero is a 85 y.o. female with past medical history of atrial fibrillation, prior stroke with no significant neurological deficits who presented with left-sided weakness and speech disturbance.  Patient states she was sitting watching TV and was about to get up when she noticed weakness in her left leg as well as left arm.  She also felt her speech was slurred.  Denies any vision changes, tingling, numbness.  She was brought [facility] and code stroke was activated.  NIHSS on arrival was 4 (please review telemetry neurology stroke note for further information).  Patient states she continues to be weaker than her baseline (able to walk around independently) and her speech continues to be slurred.  She states she took her apixaban tablet yesterday morning, denies missing any doses.  Denies any recent illnesses, medication changes  Patient was at home Last known normal 1630 on 12/16/2020 SBP on arrival 160/109 CBG 122 tPA not administered as patient had taken Eliquis less than 48 hours ago Thrombectomy not performed as patient did not have large vessel occlusion.   ROS: All other systems reviewed and negative except as noted in the HPI.   Past Medical History:  Diagnosis Date   Allergic rhinitis due to pollen    Asthma    Atrial fibrillation ([place])    Biventricular ICD (implantable cardiac  defibrillator) in place    St Jude 10/2011 (implant MD: Allred)   Bradycardia    a. coreg tapered off 05/2011 => b. Metoprolol restarted after cardiac arrest 09/2011   CAD (coronary artery disease)    a. s/p stent to RCA 1996;  b. s/p cutting balloon PTCA to LAD 2001; c. s/p Cypher DES to LAD 2003;  d. NSTEMI 4/12: DES to dRCA;  e.  [place] 9/13 (after presentation with cardiac arrest and + Nz's for NSTEMI): pLAD 30% ISR, oD1 30%, dLAD 60-70%, pOM1 (small vessel) 90%, oOM2 95% (moderate to large vessel), mCFX 30%, pOM3 30-40%, dRCA stents and pPDA stents patent with 30% ISR => med Rx rec.    Cancer [facility])    Cardiac arrest ([place]) 09/2011   a. VT/VF in community; resusc unsuccessful;  PEA in ED; multiple defibs => revived;  [employer];  c/b by VDRF, cardiogenic shock;  LHC with stable anatomy => med Rx;  difficult ween from vent => s/p trach;  d/c to SNF   Chronic eczema    hands   Chronic systolic heart failure (HCC)    Hemorrhoids    HLD (hyperlipidemia)    HTN (hypertension)    Ischemic cardiomyopathy    a. EF 40% at cath 4/12 with AL and Inf HK;  b.  Echo after presentation with cardiac arrest 9/13:  EF 40-45% and severe inf HK to AK c/w infarction, PASP 42   Left bundle branch block    Other and unspecified ovarian cyst    Other atopic dermatitis and related conditions    Personal history of hyperthyroidism  Personal history of malignant neoplasm of breast     Family History  Problem Relation Age of Onset   Uterine cancer Mother    Hypertension Mother    Coronary artery disease Father    Heart disease Brother    Heart disease Sister    Endometrial cancer Sister    Heart disease Brother    Clotting disorder Brother        blood clot   Colon cancer Neg Hx     Social History:  reports that she has never smoked. She has never used smokeless tobacco. She reports that she does not drink alcohol and does not use drugs.   Medications Prior to Admission  Medication Sig Dispense  Refill Last Dose   ELIQUIS 5 MG TABS tablet Take 1 tablet by mouth twice daily 60 tablet 0    atorvastatin (LIPITOR) 40 MG tablet Take 1 tablet by mouth once daily 90 tablet 3    bisoprolol (ZEBETA) 5 MG tablet Take 1 tablet (5 mg total) by mouth 2 (two) times daily. 180 tablet 2    Blood Glucose Monitoring Suppl (ONE TOUCH ULTRA 2) w/Device KIT Use to check blood sugars daily Dx e11.9 1 each 0    cetirizine (ZYRTEC) 10 MG tablet Take 1 tablet (10 mg total) by mouth as needed for allergies. (Patient taking differently: Take 10 mg by mouth daily.) 90 tablet 3    Cholecalciferol (VITAMIN D3) 50 MCG (2000 UT) capsule Take 1 capsule (2,000 Units total) by mouth daily. 100 capsule 3    Cyanocobalamin 1000 MCG SUBL Place 1 tablet (1,000 mcg total) under the tongue daily. 100 tablet 11    doxycycline (VIBRA-TABS) 100 MG tablet Take 1 tablet (100 mg total) by mouth 2 (two) times daily. 14 tablet 0    glucose blood (ONE TOUCH ULTRA TEST) test strip 1 each by Other route daily as needed for other. Use to check blood sugars twice a day Dx E11.9 50 each 11    Lancets (ONETOUCH ULTRASOFT) lancets 1 each by Other route 2 (two) times daily. Use to check blood sugars twice a day Dx E11.9 100 each 3    metFORMIN (GLUCOPHAGE) 500 MG tablet Take 1 tablet (500 mg total) by mouth daily with breakfast. 90 tablet 3    Multiple Vitamins-Minerals (PRESERVISION AREDS PO) Take 1 capsule by mouth 2 (two) times daily.      nitroGLYCERIN (NITROSTAT) 0.4 MG SL tablet Place 1 tablet (0.4 mg total) under the tongue every 5 (five) minutes x 3 doses as needed for chest pain. Chest pain 20 tablet 3    Respiratory Therapy Supplies (NEBULIZER) DEVI Use with solution Q6H PRN wheezing or shortness of breath 1 each 0    spironolactone (ALDACTONE) 25 MG tablet Take 1 tablet (25 mg total) by mouth daily. 90 tablet 3    telmisartan (MICARDIS) 40 MG tablet Take 1 tablet by mouth once daily 90 tablet 2    triamcinolone ointment (KENALOG) 0.5 %  Apply 1 application topically 2 (two) times daily. On the leg rash 45 g 3       Exam: Current vital signs: BP (!) 183/80 (BP Location: Left Arm)   Pulse 75   Temp 98.4 F (36.9 C) (Oral)   Resp 18   Ht 5' 2.99" (1.6 m)   Wt 62.8 kg   SpO2 98%   BMI 24.53 kg/m  Vital signs in last 24 hours: Temp:  [97.7 F (36.5 C)-98.4 F (36.9 C)] 98.4  F (36.9 C) (12/02 1314) Pulse Rate:  [60-75] 75 (12/02 1314) Resp:  [13-22] 18 (12/02 0800) BP: (154-197)/(78-145) 183/80 (12/02 1314) SpO2:  [95 %-99 %] 98 % (12/02 1314) Weight:  [62.8 kg-66.2 kg] 62.8 kg (12/02 0243)   Physical Exam  Constitutional: Appears well-developed and well-nourished.  Psych: Affect appropriate to situation Eyes: No scleral injection HENT: No OP obstrucion Head: Normocephalic.  Respiratory: Effort normal, non-labored breathing Neuro: AOx3, dysarthria, no evidence of aphasia, cranial nerves II to XII appear grossly intact, 2/3 left upper extremity, 3/3 in left lower extremity, antigravity strength without drift in right upper and lower extremity, sensory intact to light touch (performed by RN at bedside)  I have reviewed labs in epic and the results pertinent to this consultation are: CBC:  Recent Labs  Lab 12/16/20 2252 12/17/20 0616  WBC 9.7 9.2  NEUTROABS 6.0  --   HGB 12.9 12.4  HCT 39.4 38.2  MCV 85.5 84.3  PLT 270 527    Basic Metabolic Panel:  Lab Results  Component Value Date   NA 140 12/17/2020   K 2.6 (LL) 12/17/2020   CO2 28 12/17/2020   GLUCOSE 105 (H) 12/17/2020   BUN 9 12/17/2020   CREATININE 0.50 12/17/2020   CALCIUM 8.6 (L) 12/17/2020   GFRNONAA >60 12/17/2020   GFRAA 66 03/11/2019   Lipid Panel:  Lab Results  Component Value Date   LDLCALC 79 12/17/2020   HgbA1c:  Lab Results  Component Value Date   HGBA1C 6.0 (H) 12/17/2020   Urine Drug Screen:     Component Value Date/Time   LABOPIA NONE DETECTED 12/17/2020 0301   COCAINSCRNUR NONE DETECTED 12/17/2020 0301    LABBENZ NONE DETECTED 12/17/2020 0301   AMPHETMU NONE DETECTED 12/17/2020 0301   THCU NONE DETECTED 12/17/2020 0301   LABBARB NONE DETECTED 12/17/2020 0301    Alcohol Level     Component Value Date/Time   ETH <10 12/16/2020 2252     I have reviewed the images obtained: CT head without contrast 12/16/2020: No acute intracranial abnormality. ASPECTS is 10.   CTA head and neck 12/16/2020: No emergent large vessel occlusion. Severe stenosis of the left MCA M1 segment. Short segment occlusion of the distal right PCA P2 segment.  TTE performed, report pending.  We will follow  ASSESSMENT/PLAN:   Acute ischemic stroke  -Patient has significant left-sided weakness.  On exam she does not have any cortical signs therefore it is possible she had a posterior circulation stroke. -Etiology: Cardioembolic versus small vessel disease  Recommendations: -Cannot obtain MRI brain due to AICD which is MRI noncompatible -Repeat CT head after 24 hours to look for location of stroke -Due to intracranial stenosis, recommend adding aspirin 81 mg daily -Recommend increasing atorvastatin to 80 mg daily for secondary stroke prevention -Continue home dose of Eliquis -Permissive hypertension for 24 hours (4:30 PM today) followed by gradual normotension.  Goal SBP between 120-140, avoid hypotension due to severe intracranial stenosis -PT/OT/speech therapy -Management of rest of comorbidities per primary team -Attempted to call patient's son but it went to voicemail.  Discussed plan with Dr. Denton Brick  Thank you for allowing [place] to participate in the care of this patient. If you have any further questions, please contact  me or neurohospitalist.   Zeb Comfort [facility]ile[facility] neurohospitalist

## 2020-12-17 NOTE — Consult Note (Signed)
Modified Barium Swallow Progress Note  Patient Details  Name: Alicia Guerrero MRN: [id number] Date of Birth: 01-17-1935  Today's Date: 12/17/2020  Modified Barium Swallow completed.  Full report located under Chart Review in the Imaging Section.  Brief recommendations include the following:  Clinical Impression  Pt presents with oropharyngeal dysphagia characterized by mld decreased bolus cohesion/propulsion with mechanical soft consistency and decreased tongue base retraction with all consistencies with delay in the initiaton of the swallow to the level of the valleculae/pyriform sinuses with decreased airway closure and eventual aspiration with thin liquids and larger volumes of nectar-thickened liquids.  Reducing volume of nectar and providing puree consistency cleared pharynx and did not elicit aspiration. Mechanical soft consistency remained in the valleculae and with liquid wash of nectar did not clear residue effectively.  Pt also exhibits decreased sustained attention which impacts her ability to consume a more liberal diet safely.  Recommend initiating a diet of Dysphagia 1/nectar-thickened liquids with small sips and repetitive swallows/effortful swallow to clear from pharynx during meals.  Recommend pharyngeal exercises and dysphagia management while pt is in acute setting.   Swallow Evaluation Recommendations       SLP Diet Recommendations: Nectar thick liquid;Dysphagia 1 (Puree) solids   Liquid Administration via: Cup;Other (Comment) (if pt has FULL supervision, straws can be utilized, but otherwise, cup sips only)   Medication Administration: Crushed with puree   Supervision: Full supervision/cueing for compensatory strategies;Staff to assist with self feeding   Compensations: Slow rate;Small sips/bites;Minimize environmental distractions;Lingual sweep for clearance of pocketing;Multiple dry swallows after each bite/sip;Effortful swallow   Postural Changes: Seated upright at  90 degrees   Oral Care Recommendations: Oral care BID   Other Recommendations: Order thickener from [place], M.S.,CCC-SLP 12/17/2020,9:23 PM

## 2020-12-17 NOTE — Progress Notes (Signed)
MRI called.  They are unable to perform MRI d/t defibrillator.

## 2020-12-18 ENCOUNTER — Inpatient Hospital Stay (HOSPITAL_COMMUNITY): Payer: Medicare HMO

## 2020-12-18 ENCOUNTER — Other Ambulatory Visit (HOSPITAL_COMMUNITY): Payer: Self-pay | Admitting: *Deleted

## 2020-12-18 DIAGNOSIS — I6389 Other cerebral infarction: Secondary | ICD-10-CM

## 2020-12-18 LAB — ECHOCARDIOGRAM COMPLETE
Area-P 1/2: 2.11 cm2
Height: 62.992 in
S' Lateral: 3.8 cm
Weight: 2215.18 oz

## 2020-12-18 LAB — RENAL FUNCTION PANEL
Albumin: 3.8 g/dL (ref 3.5–5.0)
Anion gap: 9 (ref 5–15)
BUN: 11 mg/dL (ref 8–23)
CO2: 27 mmol/L (ref 22–32)
Calcium: 9.4 mg/dL (ref 8.9–10.3)
Chloride: 105 mmol/L (ref 98–111)
Creatinine, Ser: 0.6 mg/dL (ref 0.44–1.00)
GFR, Estimated: >60 mL/min (ref >60)
Glucose, Bld: 98 mg/dL (ref 70–99)
Phosphorus: 3.4 mg/dL (ref 2.5–4.6)
Potassium: 3 mmol/L — ABNORMAL LOW (ref 3.5–5.1)
Sodium: 141 mmol/L (ref 135–145)

## 2020-12-18 LAB — GLUCOSE, CAPILLARY
Glucose-Capillary: 107 mg/dL — ABNORMAL HIGH (ref 70–99)
Glucose-Capillary: 141 mg/dL — ABNORMAL HIGH (ref 70–99)
Glucose-Capillary: 143 mg/dL — ABNORMAL HIGH (ref 70–99)
Glucose-Capillary: 168 mg/dL — ABNORMAL HIGH (ref 70–99)
Glucose-Capillary: 73 mg/dL (ref 70–99)
Glucose-Capillary: 90 mg/dL (ref 70–99)

## 2020-12-18 LAB — MAGNESIUM: Magnesium: 1.7 mg/dL (ref 1.7–2.4)

## 2020-12-18 MED ORDER — INSULIN ASPART 100 UNIT/ML IJ SOLN
0.0000 [IU] | Freq: Every day | INTRAMUSCULAR | Status: DC
Start: 2020-12-18 — End: 2020-12-22

## 2020-12-18 MED ORDER — MAGNESIUM SULFATE 2 GM/50ML IV SOLN
2.0000 g | Freq: Once | INTRAVENOUS | Status: AC
  Administered 2020-12-18: 2 g via INTRAVENOUS
  Filled 2020-12-18: qty 50

## 2020-12-18 MED ORDER — POTASSIUM CHLORIDE CRYS ER 20 MEQ PO TBCR
40.0000 meq | EXTENDED_RELEASE_TABLET | ORAL | Status: AC
  Administered 2020-12-18 (×2): 40 meq via ORAL
  Filled 2020-12-18 (×2): qty 2

## 2020-12-18 MED ORDER — AMLODIPINE BESYLATE 5 MG PO TABS
5.0000 mg | ORAL_TABLET | Freq: Every day | ORAL | Status: DC
  Administered 2020-12-19: 5 mg via ORAL
  Filled 2020-12-18: qty 1

## 2020-12-18 MED ORDER — INSULIN ASPART 100 UNIT/ML IJ SOLN
0.0000 [IU] | Freq: Three times a day (TID) | INTRAMUSCULAR | Status: DC
  Administered 2020-12-19 – 2020-12-22 (×6): 1 [IU] via SUBCUTANEOUS

## 2020-12-18 MED ORDER — IRBESARTAN 75 MG PO TABS
75.0000 mg | ORAL_TABLET | Freq: Every day | ORAL | Status: DC
  Administered 2020-12-18 – 2020-12-22 (×5): 75 mg via ORAL
  Filled 2020-12-18 (×5): qty 1

## 2020-12-18 NOTE — Progress Notes (Signed)
Inpatient Rehab Admissions Coordinator:  Attempted to contact son, [name] to discuss CIR. NSG recommended contacting son.  Left message; awaiting return call.   Gayland Curry, [place], [place] Admissions Coordinator [phone]

## 2020-12-18 NOTE — TOC Progression Note (Signed)
Transition of Care [facility]) - Progression Note    Patient Details  Name: Alicia Guerrero MRN: [id number] Date of Birth: 08-Feb-1935  [facility] of [facility]) CM/SW Contact  Natasha Bence, LCSW Phone Number: 12/18/2020, 1:55 PM  Clinical Narrative:    CSW messaged [place] and LVM for [place] with CIR to follow up on patient referral. CSW received no answer. TOC to follow.    Expected Discharge Plan: IP Rehab Facility Barriers to Discharge: Continued Medical Work up, Orthoptist and Services Expected Discharge Plan: [place] In-house Referral: Clinical Social Work   Post Acute Care Choice: IP Rehab Living arrangements for the past 2 months: Single Family Home                                       Social Determinants of Health (SDOH) Interventions    Readmission Risk Interventions No flowsheet data found.

## 2020-12-18 NOTE — Progress Notes (Signed)
*  PRELIMINARY RESULTS* Echocardiogram 2D Echocardiogram has been performed.  Alicia Guerrero 12/18/2020, 1:16 PM

## 2020-12-18 NOTE — Progress Notes (Signed)
PROGRESS NOTE     Alicia Guerrero, is a 85 y.o. female, DOB - 05-Jun-1935, VQQ:[id number]  Admit date - 12/16/2020   Admitting Physician Alicia Cline Denton Brick, MD  Outpatient Primary MD for the patient is Alicia Guerrero, Alicia Lacks, MD  LOS - 1  Chief Complaint  Patient presents with   Code Stroke   Brief Summary:- 85 y.o. female with medical history significant for  atrial fibrillation with biventricular implantable cardiac defibrillator, ischemic cardiomyopathy, hypertension, hyperlipidemia, chronic systolic heart failure, prior stroke with left-sided weakness and a cardiac arrest in September 2013 admitted on 12/17/2020 with increased weakness on the left side concerning for new stroke superimposed on previous strokes and deficits  Assessment & Plan:   Principal Problem:   Acute ischemic stroke ([place]) Active Problems:   HYPERLIPIDEMIA, MIXED   CAD (coronary artery disease)   Atrial fibrillation ([place])   Ischemic cardiomyopathy   Essential hypertension   Type 2 diabetes mellitus ([place])   Acute CVA (cerebrovascular accident) ([place])   A/p  1)Worsening Left-sided weakness concerning for new acute stroke superimposed on previous stroke-- -- CT head on admission 12/16/20 was unrevealing, repeat CT head in the a.m. of 01/04/2021 also unrevealing -CTA head and neck without LVO, significant occlusion of the left M ICA M1 segment noted, along with short segment occlusion of the distal right PCA P2 segment -Unable to do MRI due to AICD in situ -Neurologic consult appreciated recommended repeat CT head after 24 hours -Increase atorvastatin to 80 mg daily for secondary stroke prevention -Continue Eliquis, neurology recommended adding aspirin 81 mg daily -BP goal 120 to 140 mmhg -patient has severe intracranial stenosis so avoid hypotension -LDL is 79 HDL is 42, T cholesterol 126 -Left-sided hemiparesis and speech and swallowing difficulties persist -PT OT and speech therapy eval appreciated -Recommend  CIR--inpatient rehab -Patient and family agreeable  2)Chronic Atrial Fibrillation--- continue Eliquis with secondary stroke prophylaxis, bisoprolol for rate control  3)H/o CAD--ischemic cardiomyopathy, -Chest pain-free, no ACS type symptoms  Aspirin and Lipitor as above, continue bisoprolol  4)DM2--A1c 6.0 reflecting excellent diabetic control PTA -hold metformin Use Novolog/Humalog Sliding scale insulin with Accu-Cheks/Fingersticks as ordered   5)Dysphagia----pathology consult appreciated recommends dysphagia 1 diet with nectar thickened liquids  6)HTN--we initially allowed permissive hypertension for 24 hours then - -BP goal  Now is 120 to 140 mmhg -patient has severe intracranial stenosis so avoid hypotension -Increase Amlodipine to 5 mg daily  and bisoprolol for now -Restart Avapro - may use IV Hydralazine 10 mg  Every 4 hours Prn for systolic blood pressure over 160 mmhg  7) hypokalemia/hypomagnesemia--- replace and recheck  Disposition/Need for in-Hospital Stay- patient unable to be discharged at this time due to --awaiting transfer to CIR--inpatient rehab*  Status is: Inpatient  Disposition: The patient is from: Home              Anticipated d/c is to: CIR              Anticipated d/c date is: 1 day              Patient currently is medically stable to d/c. Barriers: Awaiting bed at CIR--inpatient rehab  Code Status :  -  Code Status: Full Code   Family Communication:    (patient is alert, awake and coherent)  Discussed with Son Alicia Guerrero at bedside  Consults  :  Neuro  DVT Prophylaxis  :   - SCDs   SCD's Start: 12/17/20 0235 apixaban (ELIQUIS) tablet 5 mg    Lab  Results  Component Value Date   PLT 252 12/17/2020    Inpatient Medications  Scheduled Meds:  [START ON 12/19/2020] amLODipine  5 mg Oral Daily   apixaban  5 mg Oral BID   aspirin  81 mg Oral Daily   Or   aspirin  300 mg Rectal Daily   atorvastatin  80 mg Oral Daily   bisoprolol  5 mg Oral Daily    insulin aspart  0-5 Units Subcutaneous QHS   [START ON 12/19/2020] insulin aspart  0-6 Units Subcutaneous TID WC   irbesartan  75 mg Oral Daily   Continuous Infusions: PRN Meds:.acetaminophen **OR** acetaminophen (TYLENOL) oral liquid 160 mg/5 mL **OR** acetaminophen   Anti-infectives (From admission, onward)    None         Subjective: Alicia Guerrero today has no fevers, no emesis,  No chest pain,   Son Alicia Guerrero at bedside Speech and swallowing difficulties persist -Left-sided weakness appreciated   Objective: Vitals:   12/18/20 0500 12/18/20 0806 12/18/20 1106 12/18/20 1331  BP: (!) 166/87 (!) 161/76 (!) 174/69 (!) 178/93  Pulse: 72 65 65 66  Resp: 17 16 18 16   Temp: 98 F (36.7 C) 98.1 F (36.7 C) 98.8 F (37.1 C) 98.4 F (36.9 C)  TempSrc: Oral Oral Oral   SpO2: 96% 95% 99% 93%  Weight:      Height:        Intake/Output Summary (Last 24 hours) at 12/18/2020 1742 Last data filed at 12/18/2020 0900 Gross per 24 hour  Intake 648.82 ml  Output 200 ml  Net 448.82 ml   Filed Weights   12/16/20 2312 12/17/20 0243  Weight: 66.2 kg 62.8 kg    Physical Exam  Gen:- Awake Alert, cooperative HEENT:- [place].AT, No sclera icterus Neck-Supple Neck,No JVD,.  Lungs-  CTAB , fair symmetrical air movement CV- S1, S2 normal, irregular Abd-  +ve B.Sounds, Abd Soft, No tenderness,    Extremity/Skin:- No  edema, pedal pulses present  Psych-affect is appropriate, oriented x3 Neuro-worsening left-sided hemiparesis, speech and swallowing difficulties persist, facial asymmetry persist, no tremors  Data Reviewed: I have personally reviewed following labs and imaging studies  CBC: Recent Labs  Lab 12/16/20 2252 12/17/20 0616  WBC 9.7 9.2  NEUTROABS 6.0  --   HGB 12.9 12.4  HCT 39.4 38.2  MCV 85.5 84.3  PLT 270 076   Basic Metabolic Panel: Recent Labs  Lab 12/16/20 2252 12/17/20 0616 12/18/20 1320  NA 141 140 141  K 2.7* 2.6* 3.0*  CL 101 103 105  CO2 29 28 27    GLUCOSE 112* 105* 98  BUN 13 9 11   CREATININE 0.69 0.50 0.60  CALCIUM 9.1 8.6* 9.4  MG  --  1.3* 1.7  PHOS  --  3.1 3.4   GFR: Estimated Creatinine Clearance: 44.1 mL/min (by C-G formula based on SCr of 0.6 mg/dL). Liver Function Tests: Recent Labs  Lab 12/16/20 2252 12/17/20 0616 12/18/20 1320  AST 32 30  --   ALT 30 29  --   ALKPHOS 67 64  --   BILITOT 1.0 0.9  --   PROT 6.3* 5.9*  --   ALBUMIN 3.9 3.7 3.8   No results for input(s): LIPASE, AMYLASE in the last 168 hours. No results for input(s): AMMONIA in the last 168 hours. Coagulation Profile: Recent Labs  Lab 12/16/20 2252  INR 1.6*   Cardiac Enzymes: No results for input(s): CKTOTAL, CKMB, CKMBINDEX, TROPONINI in the last 168 hours. BNP (last  3 results) No results for input(s): PROBNP in the last 8760 hours. HbA1C: Recent Labs    12/17/20 0616  HGBA1C 6.0*   CBG: Recent Labs  Lab 12/18/20 0010 12/18/20 0535 12/18/20 0719 12/18/20 1120 12/18/20 1638  GLUCAP 90 73 107* 143* 141*   Lipid Profile: Recent Labs    12/17/20 0616  CHOL 136  HDL 42  LDLCALC 79  TRIG 76  CHOLHDL 3.2   Thyroid Function Tests: No results for input(s): TSH, T4TOTAL, FREET4, T3FREE, THYROIDAB in the last 72 hours. Anemia Panel: No results for input(s): VITAMINB12, FOLATE, FERRITIN, TIBC, IRON, RETICCTPCT in the last 72 hours. Urine analysis:    Component Value Date/Time   COLORURINE YELLOW 12/17/2020 0301   APPEARANCEUR CLEAR 12/17/2020 0301   LABSPEC 1.010 12/17/2020 0301   PHURINE 7.0 12/17/2020 0301   GLUCOSEU NEGATIVE 12/17/2020 0301   GLUCOSEU NEGATIVE 05/19/2015 0843   HGBUR NEGATIVE 12/17/2020 0301   BILIRUBINUR NEGATIVE 12/17/2020 0301   KETONESUR 15 (A) 12/17/2020 0301   PROTEINUR NEGATIVE 12/17/2020 0301   UROBILINOGEN 0.2 05/19/2015 0843   NITRITE NEGATIVE 12/17/2020 0301   LEUKOCYTESUR NEGATIVE 12/17/2020 0301   Sepsis Labs: @LABRCNTIP (procalcitonin:4,lacticidven:4)  ) Recent Results (from  the past 240 hour(s))  Resp Panel by RT-PCR (Flu A&B, Covid) Nasopharyngeal Swab     Status: None   Collection Time: 12/16/20 11:48 AM   Specimen: Nasopharyngeal Swab; Nasopharyngeal(NP) swabs in vial transport medium  Result Value Ref Range Status   SARS Coronavirus 2 by RT PCR NEGATIVE NEGATIVE Final    Comment: (NOTE) SARS-CoV-2 target nucleic acids are NOT DETECTED.  The SARS-CoV-2 RNA is generally detectable in upper respiratory specimens during the acute phase of infection. The lowest concentration of SARS-CoV-2 viral copies this assay can detect is 138 copies/mL. A negative result does not preclude SARS-Cov-2 infection and should not be used as the sole basis for treatment or other patient management decisions. A negative result may occur with  improper specimen collection/handling, submission of specimen other than nasopharyngeal swab, presence of viral mutation(s) within the areas targeted by this assay, and inadequate number of viral copies(<138 copies/mL). A negative result must be combined with clinical observations, patient history, and epidemiological information. The expected result is Negative.  Fact Sheet for Patients:  EntrepreneurPulse.com.au  Fact Sheet for Healthcare Providers:  IncredibleEmployment.be  This test is no t yet approved or cleared by the [place] FDA and  has been authorized for detection and/or diagnosis of SARS-CoV-2 by FDA under an Emergency Use Authorization (EUA). This EUA will remain  in effect (meaning this test can be used) for the duration of the COVID-19 declaration under Section 564(b)(1) of the Act, 21 U.S.C.section 360bbb-3(b)(1), unless the authorization is terminated  or revoked sooner.       Influenza A by PCR NEGATIVE NEGATIVE Final   Influenza B by PCR NEGATIVE NEGATIVE Final    Comment: (NOTE) The Xpert Xpress SARS-CoV-2/FLU/RSV plus assay is intended as an aid in the diagnosis of  influenza from Nasopharyngeal swab specimens and should not be used as a sole basis for treatment. Nasal washings and aspirates are unacceptable for Xpert Xpress SARS-CoV-2/FLU/RSV testing.  Fact Sheet for Patients: EntrepreneurPulse.com.au  Fact Sheet for Healthcare Providers: IncredibleEmployment.be  This test is not yet approved or cleared by the [place] FDA and has been authorized for detection and/or diagnosis of SARS-CoV-2 by FDA under an Emergency Use Authorization (EUA). This EUA will remain in effect (meaning this test can be used) for the duration  of the COVID-19 declaration under Section 564(b)(1) of the Act, 21 U.S.C. section 360bbb-3(b)(1), unless the authorization is terminated or revoked.  Performed at [facility], [address], [place], [place] [address]       Radiology Studies: CT HEAD WO CONTRAST (5MM)  Result Date: 12/18/2020 CLINICAL DATA:  Neuro deficit, acute, stroke suspected; left-sided weakness EXAM: CT HEAD WITHOUT CONTRAST TECHNIQUE: Contiguous axial images were obtained from the base of the skull through the vertex without intravenous contrast. COMPARISON:  12/16/2020 FINDINGS: Brain: There is no acute intracranial hemorrhage, mass effect, or edema. No new loss of gray-white differentiation. Chronic infarcts of the left frontoparietal lobes. Small chronic left thalamic infarct. Additional nonspecific patchy and confluent hypoattenuation likely reflects stable chronic microvascular ischemic changes. There is no extra-axial fluid collection. Ventricles and sulci are within normal limits in size and configuration. Vascular: There is atherosclerotic calcification at the skull base. Skull: Calvarium is unremarkable. Sinuses/Orbits: No acute finding. Other: None. IMPRESSION: No acute intracranial hemorrhage or evidence of evolving recent infarction. Stable chronic findings detailed above. Electronically Signed   By: Macy Mis M.D.   On: 12/18/2020 11:15   DG Swallowing Func-Speech Pathology  Result Date: 12/17/2020 Table formatting from the original result was not included. Objective Swallowing Evaluation: Type of Study: MBS-Modified Barium Swallow Study  Patient Details Name: Foster Sonnier MRN: [id number] Date of Birth: 07/31/1935 Today's Date: 12/17/2020 Time: SLP Start Time (ACUTE ONLY): 0177 -SLP Stop Time (ACUTE ONLY): 1259 SLP Time Calculation (min) (ACUTE ONLY): 14 min Past Medical History: Past Medical History: Diagnosis Date  Allergic rhinitis due to pollen   Asthma   Atrial fibrillation ([place])   Biventricular ICD (implantable cardiac defibrillator) in place   St Jude 10/2011 (implant MD: Allred)  Bradycardia   a. coreg tapered off 05/2011 => b. Metoprolol restarted after cardiac arrest 09/2011  CAD (coronary artery disease)   a. s/p stent to RCA 1996;  b. s/p cutting balloon PTCA to LAD 2001; c. s/p Cypher DES to LAD 2003;  d. NSTEMI 4/12: DES to dRCA;  e.  [place] 9/13 (after presentation with cardiac arrest and + Nz's for NSTEMI): pLAD 30% ISR, oD1 30%, dLAD 60-70%, pOM1 (small vessel) 90%, oOM2 95% (moderate to large vessel), mCFX 30%, pOM3 30-40%, dRCA stents and pPDA stents patent with 30% ISR => med Rx rec.   Cancer [facility])   Cardiac arrest ([place]) 09/2011  a. VT/VF in community; resusc unsuccessful;  PEA in ED; multiple defibs => revived;  [employer];  c/b by VDRF, cardiogenic shock;  LHC with stable anatomy => med Rx;  difficult ween from vent => s/p trach;  d/c to SNF  Chronic eczema   hands  Chronic systolic heart failure ([facility])   Hemorrhoids   HLD (hyperlipidemia)   HTN (hypertension)   Ischemic cardiomyopathy   a. EF 40% at cath 4/12 with AL and Inf HK;  b.  Echo after presentation with cardiac arrest 9/13:  EF 40-45% and severe inf HK to AK c/w infarction, PASP 42  Left bundle branch block   Other and unspecified ovarian cyst   Other atopic dermatitis and related conditions   Personal history of  hyperthyroidism   Personal history of malignant neoplasm of breast  Past Surgical History: Past Surgical History: Procedure Laterality Date  ABDOMINAL HYSTERECTOMY    BI-VENTRICULAR IMPLANTABLE CARDIOVERTER DEFIBRILLATOR N/A 11/02/2011  Procedure: BI-VENTRICULAR IMPLANTABLE CARDIOVERTER DEFIBRILLATOR  (CRT-D);  Surgeon: Thompson Grayer, MD;  Location: [facility] CATH LAB;  Service: Cardiovascular;  Laterality: N/A;  BIV ICD GENERATOR CHANGEOUT N/A 05/11/2017  Procedure: BIV ICD GENERATOR CHANGEOUT;  Surgeon: [name], MD;  Location: [facility] CV LAB;  Service: Cardiovascular;  Laterality: N/A;  BREAST LUMPECTOMY  08/24/2003  right lumpectomy+sln,T2N0,ERPR+,Her2-  CHOLECYSTECTOMY    COLONOSCOPY    CORONARY STENT PLACEMENT  may and  july 2012  LEFT HEART CATHETERIZATION WITH CORONARY ANGIOGRAM N/A 10/09/2011  Procedure: LEFT HEART CATHETERIZATION WITH CORONARY ANGIOGRAM;  Surgeon: Larey Dresser, MD;  Location: [facility] CATH LAB;  Service: Cardiovascular;  Laterality: N/A;  OOPHORECTOMY    POLYPECTOMY    PTCA    VIDEO BRONCHOSCOPY  10/26/2011  Procedure: VIDEO BRONCHOSCOPY WITHOUT FLUORO;  Surgeon: Raylene Miyamoto, MD;  Location: [facility] ENDOSCOPY;  Service: Endoscopy;  Laterality: Bilateral; HPI: 85 y.o. female with medical history significant for  atrial fibrillation with biventricular implantable cardiac defibrillator, ischemic cardiomyopathy, hypertension, hyperlipidemia, chronic systolic heart failure, prior stroke with left-sided weakness and a cardiac arrest in September 2013 who presents to the emergency department due to increased left-sided weakness (from baseline) which started yesterday in the afternoon.  Patient was unable to provide history due to slurred speech, history was obtained from ED physician, ED medical record and son at bedside.  Per report, patient noted increased leg weakness around 4:30 PM when she tried to get up and she also noted increased left upper extremity from baseline.  Slurred speech was  noted.  EMS was activated and patient was taken to the ED for further evaluation and management.  She denies chest pain, shortness of breath, headache, vision changes; CT head indicated on 12/16/20 negative; MRI pending ability to perform d/t defibrillator; BSE recommended MBS to determine safest diet/dysphagia tx required.  Subjective: "I'm thirsty"  Recommendations for follow up therapy are one component of a multi-disciplinary discharge planning process, led by the attending physician.  Recommendations may be updated based on patient status, additional functional criteria and insurance authorization. Assessment / Plan / Recommendation Clinical Impressions 12/17/2020 Clinical Impression Pt presents with oropharyngeal dysphagia characterized by mild decreased bolus cohesion/propulsion with mechanical soft consistency and decreased tongue base retraction with all consistencies with delay in the initiaton of the swallow at the level of the valleculae/pyriform sinuses with decrased airway closure and eventual aspiration with thin liquids (PAS 5) and larger volumes of nectar-thickened liquids (PAS 3); reducing volume of nectar and providing puree consistency cleared pharynx and did not elicit aspiration. Mechanical soft consistency remained in the valleculae and with liquid wash of nectar did not clear residue effectively.  Pt also exhibits decreased sustained attention which impacts her ability to consume a more liberal diet safely.  Recommend initiating a diet of Dysphagia 1/nectar-thickened liquids with small sips and repetitive swallows/effortful swallow to clear from pharynx during meals.  Recommend pharyngeal exercises and dysphagia management while pt is in acute setting.** SLP Visit Diagnosis Dysphagia, oropharyngeal phase (R13.12);Dysarthria and anarthria (R47.1);Cognitive communication deficit (R41.841);Attention and concentration deficit Attention and concentration deficit following Other cerebrovascular  disease Frontal lobe and executive function deficit following -- Impact on safety and function Mild aspiration risk;Moderate aspiration risk   Treatment Recommendations 12/17/2020 Treatment Recommendations Therapy as outlined in treatment plan below   Prognosis 12/17/2020 Prognosis for Safe Diet Advancement Good Barriers to Reach Goals Cognitive deficits Barriers/Prognosis Comment -- Diet Recommendations 12/17/2020 SLP Diet Recommendations Nectar thick liquid;Dysphagia 1 (Puree) solids Liquid Administration via Cup;Other (Comment) Medication Administration Crushed with puree Compensations Slow rate;Small sips/bites;Minimize environmental distractions;Lingual sweep for clearance of pocketing;Multiple dry swallows after each  bite/sip;Effortful swallow Postural Changes Seated upright at 90 degrees   Other Recommendations 12/17/2020 Recommended Consults -- Oral Care Recommendations Oral care BID Other Recommendations Order thickener from pharmacy Follow Up Recommendations Acute inpatient rehab (3hours/day) Assistance recommended at discharge Frequent or constant Supervision/Assistance Functional Status Assessment Patient has had a recent decline in their functional status and demonstrates the ability to make significant improvements in function in a reasonable and predictable amount of time. Frequency and Duration  12/17/2020 Speech Therapy Frequency (ACUTE ONLY) min 2x/week Treatment Duration 1 week   Oral Phase 12/17/2020 Oral Phase Impaired Oral - Pudding Teaspoon -- Oral - Pudding Cup -- Oral - Honey Teaspoon -- Oral - Honey Cup -- Oral - Nectar Teaspoon -- Oral - Nectar Cup -- Oral - Nectar Straw -- Oral - Thin Teaspoon -- Oral - Thin Cup -- Oral - Thin Straw -- Oral - Puree Impaired mastication Oral - Mech Soft Impaired mastication;Reduced posterior propulsion;Decreased bolus cohesion Oral - Regular Impaired mastication;Decreased bolus cohesion Oral - Multi-Consistency NT Oral - Pill Reduced posterior propulsion Oral  Phase - Comment Mild oral manipulation/propulsion with pill and soft solids/mechanical soft consistencies  Pharyngeal Phase 12/17/2020 Pharyngeal Phase Impaired Pharyngeal- Pudding Teaspoon -- Pharyngeal -- Pharyngeal- Pudding Cup -- Pharyngeal -- Pharyngeal- Honey Teaspoon -- Pharyngeal -- Pharyngeal- Honey Cup -- Pharyngeal -- Pharyngeal- Nectar Teaspoon NT Pharyngeal -- Pharyngeal- Nectar Cup Reduced airway/laryngeal closure;Delayed swallow initiation-vallecula;Reduced tongue base retraction;Penetration/Aspiration during swallow;Trace aspiration Pharyngeal Material enters airway, remains ABOVE vocal cords then ejected out Pharyngeal- Nectar Straw Delayed swallow initiation-vallecula;Reduced tongue base retraction Pharyngeal Material does not enter airway Pharyngeal- Thin Teaspoon NT Pharyngeal -- Pharyngeal- Thin Cup Delayed swallow initiation-pyriform sinuses;Moderate aspiration;Reduced tongue base retraction;Penetration/Aspiration during swallow;Pharyngeal residue - valleculae;Compensatory strategies attempted (with notebox) Pharyngeal Material enters airway, CONTACTS cords and not ejected out Pharyngeal- Thin Straw Delayed swallow initiation-pyriform sinuses Pharyngeal Material enters airway, CONTACTS cords and not ejected out Pharyngeal- Puree Delayed swallow initiation-vallecula;Reduced tongue base retraction Pharyngeal Material does not enter airway Pharyngeal- Mechanical Soft Delayed swallow initiation-vallecula;Reduced tongue base retraction;Compensatory strategies attempted (with notebox) Pharyngeal Material does not enter airway Pharyngeal- Regular NT Pharyngeal -- Pharyngeal- Multi-consistency NT Pharyngeal -- Pharyngeal- Pill -- Pharyngeal Material does not enter airway Pharyngeal Comment Aspiration with thin via cup and nectar with larger volumes, but no aspiration noted with smaller volumes of nectar thickened liquids, puree; repetitive swallows/effortful swallows cleared puree from valleculae; pill  removed from oral cavity  No flowsheet data found. Elvina Sidle, M.S., CCC-SLP 12/17/2020, 9:17 PM                     ECHOCARDIOGRAM COMPLETE  Result Date: 12/18/2020    ECHOCARDIOGRAM REPORT   Patient Name:   CASSIDEE DEATS Date of Exam: 12/18/2020 Medical Rec #:  [id number]       Height:       63.0 in Accession #:    [id number]      Weight:       138.4 lb Date of Birth:  [birth year]       BSA:          1.654 m Patient Age:    62 years        BP:           161/76 mmHg Patient Gender: F               HR:           65 bpm. Exam Location:  [facility] Procedure: 2D Echo, Cardiac  Doppler and Color Doppler Indications:    Stroke I63.9  History:        Patient has prior history of Echocardiogram examinations, most                 recent 06/26/2018. CAD and Previous Myocardial Infarction,                 Defibrillator, Stroke, Arrythmias:Atrial Fibrillation; Risk                 Factors:Hypertension, Dyslipidemia and Diabetes.  Sonographer:    Alvino Chapel RCS Referring Phys: [id number] OLADAPO ADEFESO IMPRESSIONS  1. LVEF is depressed with hypokinesis of the mid/distal lateral, apical, distal anterior walls Compared to previous echo, changes are new . Left ventricular ejection fraction, by estimation, is 40%%. The left ventricle has moderately decreased function.  There is mild left ventricular hypertrophy. Left ventricular diastolic parameters are consistent with Grade I diastolic dysfunction (impaired relaxation).  2. Right ventricular systolic function is normal. The right ventricular size is normal.  3. Trivial mitral valve regurgitation.  4. The aortic valve is abnormal. Aortic valve regurgitation is not visualized. Aortic valve sclerosis/calcification is present, without any evidence of aortic stenosis.  5. The inferior vena cava is normal in size with greater than 50% respiratory variability, suggesting right atrial pressure of 3 mmHg. FINDINGS  Left Ventricle: LVEF is depressed with hypokinesis of the mid/distal  lateral, apical, distal anterior walls Compared to previous echo, changes are new. Left ventricular ejection fraction, by estimation, is 40%%. The left ventricle has moderately decreased function. The left ventricular internal cavity size was normal in size. There is mild left ventricular hypertrophy. Left ventricular diastolic parameters are consistent with Grade I diastolic dysfunction (impaired relaxation). Right Ventricle: The right ventricular size is normal. Right vetricular wall thickness was not assessed. Right ventricular systolic function is normal. Left Atrium: Left atrial size was normal in size. Right Atrium: Right atrial size was normal in size. Pericardium: There is no evidence of pericardial effusion. Mitral Valve: There is mild thickening of the mitral valve leaflet(s). Mild to moderate mitral annular calcification. Trivial mitral valve regurgitation. Tricuspid Valve: The tricuspid valve is normal in structure. Tricuspid valve regurgitation is trivial. Aortic Valve: The aortic valve is abnormal. Aortic valve regurgitation is not visualized. Aortic valve sclerosis/calcification is present, without any evidence of aortic stenosis. Pulmonic Valve: The pulmonic valve was not well visualized. Pulmonic valve regurgitation is not visualized. Aorta: The aortic root is normal in size and structure. Venous: The inferior vena cava is normal in size with greater than 50% respiratory variability, suggesting right atrial pressure of 3 mmHg. IAS/Shunts: No atrial level shunt detected by color flow Doppler. Additional Comments: A device lead is visualized.  LEFT VENTRICLE PLAX 2D LVIDd:         4.70 cm   Diastology LVIDs:         3.80 cm   LV e' medial:    3.26 cm/s LV PW:         0.90 cm   LV E/e' medial:  13.1 LV IVS:        1.30 cm   LV e' lateral:   5.33 cm/s LVOT diam:     1.80 cm   LV E/e' lateral: 8.0 LV SV:         50 LV SV Index:   30 LVOT Area:     2.54 cm  RIGHT VENTRICLE RV S prime:     16.30 cm/s  TAPSE (M-mode): 2.1 cm LEFT ATRIUM             Index        RIGHT ATRIUM           Index LA diam:        3.30 cm 2.00 cm/m   RA Area:     13.20 cm LA Vol (A2C):   59.9 ml 36.22 ml/m  RA Volume:   33.70 ml  20.38 ml/m LA Vol (A4C):   39.8 ml 24.07 ml/m LA Biplane Vol: 49.7 ml 30.05 ml/m  AORTIC VALVE LVOT Vmax:   85.60 cm/s LVOT Vmean:  57.400 cm/s LVOT VTI:    0.197 m  AORTA Ao Root diam: 3.30 cm MITRAL VALVE MV Area (PHT): 2.11 cm    SHUNTS MV Decel Time: 359 msec    Systemic VTI:  0.20 m MV E velocity: 42.80 cm/s  Systemic Diam: 1.80 cm MV A velocity: 80.50 cm/s MV E/A ratio:  0.53 Dorris Carnes MD Electronically signed by Dorris Carnes MD Signature Date/Time: 12/18/2020/3:37:30 PM    Final    CT HEAD CODE STROKE WO CONTRAST  Result Date: 12/16/2020 CLINICAL DATA:  Code stroke.  Acute neurologic deficit EXAM: CT HEAD WITHOUT CONTRAST TECHNIQUE: Contiguous axial images were obtained from the base of the skull through the vertex without intravenous contrast. COMPARISON:  06/25/2018 FINDINGS: Brain: There is no mass, hemorrhage or extra-axial collection. There is generalized atrophy without lobar predilection. There is hypoattenuation of the periventricular white matter, most commonly indicating chronic ischemic microangiopathy. There is an old left thalamic small vessel infarct. Vascular: No abnormal hyperdensity of the major intracranial arteries or dural venous sinuses. No intracranial atherosclerosis. Skull: The visualized skull base, calvarium and extracranial soft tissues are normal. Sinuses/Orbits: No fluid levels or advanced mucosal thickening of the visualized paranasal sinuses. No mastoid or middle ear effusion. The orbits are normal. ASPECTS [facility] Stroke Program Early CT Score) - Ganglionic level infarction (caudate, lentiform nuclei, internal capsule, insula, M1-M3 cortex): 7 - Supraganglionic infarction (M4-M6 cortex): 3 Total score (0-10 with 10 being normal): 10 IMPRESSION: 1. No acute  intracranial abnormality. 2. ASPECTS is 10. These results were called by telephone at the time of interpretation on 12/16/2020 at 11:03 pm to provider Sherwood Gambler , who verbally acknowledged these results. Electronically Signed   By: Ulyses Jarred M.D.   On: 12/16/2020 23:03   CT ANGIO HEAD NECK W WO CM W PERF (CODE STROKE)  Result Date: 12/17/2020 CLINICAL DATA:  Left-sided weakness EXAM: CT ANGIOGRAPHY HEAD AND NECK CT PERFUSION BRAIN TECHNIQUE: Multidetector CT imaging of the head and neck was performed using the standard protocol during bolus administration of intravenous contrast. Multiplanar CT image reconstructions and MIPs were obtained to evaluate the vascular anatomy. Carotid stenosis measurements (when applicable) are obtained utilizing NASCET criteria, using the distal internal carotid diameter as the denominator. Multiphase CT imaging of the brain was performed following IV bolus contrast injection. Subsequent parametric perfusion maps were calculated using RAPID software. CONTRAST:  19m OMNIPAQUE IOHEXOL 350 MG/ML SOLN COMPARISON:  None. FINDINGS: CT HEAD FINDINGS Brain: There is no mass, hemorrhage or extra-axial collection. The size and configuration of the ventricles and extra-axial CSF spaces are normal. There is no acute or chronic infarction. The brain parenchyma is normal. Skull: The visualized skull base, calvarium and extracranial soft tissues are normal. Sinuses/Orbits: No fluid levels or advanced mucosal thickening of the visualized paranasal sinuses. No mastoid or middle ear effusion. The orbits are normal.  CTA NECK FINDINGS SKELETON: There is no bony spinal canal stenosis. No lytic or blastic lesion. OTHER NECK: Normal pharynx, larynx and major salivary glands. No cervical lymphadenopathy. Unremarkable thyroid gland. UPPER CHEST: No pneumothorax or pleural effusion. No nodules or masses. AORTIC ARCH: There is calcific atherosclerosis of the aortic arch. There is no aneurysm,  dissection or hemodynamically significant stenosis of the visualized portion of the aorta. Conventional 3 vessel aortic branching pattern. The visualized proximal subclavian arteries are widely patent. RIGHT CAROTID SYSTEM: No dissection, occlusion or aneurysm. Mild atherosclerotic calcification at the carotid bifurcation without hemodynamically significant stenosis. LEFT CAROTID SYSTEM: No dissection, occlusion or aneurysm. Mild atherosclerotic calcification at the carotid bifurcation without hemodynamically significant stenosis. VERTEBRAL ARTERIES: Left dominant configuration. Both origins are clearly patent. There is no dissection, occlusion or flow-limiting stenosis to the skull base (V1-V3 segments). CTA HEAD FINDINGS POSTERIOR CIRCULATION: --Vertebral arteries: Normal V4 segments. --Inferior cerebellar arteries: Normal. --Basilar artery: Normal. --Superior cerebellar arteries: Normal. --Posterior cerebral arteries (PCA): Short segment occlusion of the distal right P2 segment (series 9, image 19). ANTERIOR CIRCULATION: --Intracranial internal carotid arteries: Atherosclerotic calcification of the internal carotid arteries at the skull base without hemodynamically significant stenosis. --Anterior cerebral arteries (ACA): Normal. Both A1 segments are present. Patent anterior communicating artery (a-comm). --Middle cerebral arteries (MCA): Severe stenosis of the left MCA M1 segment. VENOUS SINUSES: As permitted by contrast timing, patent. ANATOMIC VARIANTS: Fetal origin of the left posterior cerebral artery. Review of the MIP images confirms the above findings. CT Brain Perfusion Findings: ASPECTS: 10 CBF (<30%) Volume: 31m Perfusion (Tmax>6.0s) volume: 066mMismatch Volume: 61m74mnfarction Location:None IMPRESSION: 1. No emergent large vessel occlusion. 2. Severe stenosis of the left MCA M1 segment. 3. Short segment occlusion of the distal right PCA P2 segment. Aortic Atherosclerosis (ICD10-I70.0). Electronically  Signed   By: KevUlyses JarredD.   On: 12/17/2020 00:00     Scheduled Meds:  [START ON 12/19/2020] amLODipine  5 mg Oral Daily   apixaban  5 mg Oral BID   aspirin  81 mg Oral Daily   Or   aspirin  300 mg Rectal Daily   atorvastatin  80 mg Oral Daily   bisoprolol  5 mg Oral Daily   insulin aspart  0-5 Units Subcutaneous QHS   [START ON 12/19/2020] insulin aspart  0-6 Units Subcutaneous TID WC   irbesartan  75 mg Oral Daily   Continuous Infusions:   LOS: 1 day    CouRoxan HockeyD on 12/18/2020 at 5:42 PM  Go to [email or web address] - for contact info  [facility] - Office  [phone] 7PM-7AM, please contact night-coverage [email or web address] Password [email or web address]03/2020, 5:42 PM

## 2020-12-19 LAB — BASIC METABOLIC PANEL WITH GFR
Anion gap: 9 (ref 5–15)
BUN: 13 mg/dL (ref 8–23)
CO2: 25 mmol/L (ref 22–32)
Calcium: 9 mg/dL (ref 8.9–10.3)
Chloride: 107 mmol/L (ref 98–111)
Creatinine, Ser: 0.62 mg/dL (ref 0.44–1.00)
GFR, Estimated: >60 mL/min
Glucose, Bld: 141 mg/dL — ABNORMAL HIGH (ref 70–99)
Potassium: 3 mmol/L — ABNORMAL LOW (ref 3.5–5.1)
Sodium: 141 mmol/L (ref 135–145)

## 2020-12-19 LAB — GLUCOSE, CAPILLARY
Glucose-Capillary: 114 mg/dL — ABNORMAL HIGH (ref 70–99)
Glucose-Capillary: 146 mg/dL — ABNORMAL HIGH (ref 70–99)
Glucose-Capillary: 196 mg/dL — ABNORMAL HIGH (ref 70–99)
Glucose-Capillary: 184 mg/dL — ABNORMAL HIGH (ref 70–99)

## 2020-12-19 MED ORDER — HYDRALAZINE HCL 20 MG/ML IJ SOLN
10.0000 mg | Freq: Four times a day (QID) | INTRAMUSCULAR | Status: DC | PRN
  Administered 2020-12-19: 10 mg via INTRAVENOUS
  Filled 2020-12-19: qty 1

## 2020-12-19 MED ORDER — AMLODIPINE BESYLATE 5 MG PO TABS
10.0000 mg | ORAL_TABLET | Freq: Every day | ORAL | Status: DC
  Administered 2020-12-20 – 2020-12-22 (×3): 10 mg via ORAL
  Filled 2020-12-19 (×3): qty 2

## 2020-12-19 MED ORDER — POTASSIUM CHLORIDE 20 MEQ PO PACK
40.0000 meq | PACK | ORAL | Status: AC
  Administered 2020-12-19 (×2): 40 meq via ORAL
  Filled 2020-12-19 (×2): qty 2

## 2020-12-19 MED ORDER — AMLODIPINE BESYLATE 5 MG PO TABS
5.0000 mg | ORAL_TABLET | Freq: Once | ORAL | Status: AC
  Administered 2020-12-19: 5 mg via ORAL
  Filled 2020-12-19: qty 1

## 2020-12-19 MED ORDER — CETIRIZINE HCL 5 MG/5ML PO SOLN
5.0000 mg | Freq: Every day | ORAL | Status: DC
  Filled 2020-12-19 (×4): qty 5

## 2020-12-19 NOTE — Progress Notes (Signed)
Inpatient Rehab Admissions:  Inpatient Rehab Consult received.  Attempted to contact person; received no answer. I spoke with patient's son, Karma Greaser on the telephone for rehabilitation assessment and to discuss goals and expectations of an inpatient rehab admission.  He acknowledged understanding of CIR goals and expectations. He is interested in pt pursuing CIR. He confirmed he and other family will be able to provide 24/7 support for pt after discharge. Will continue to follow.  Signed: Gayland Curry, [place], [place] Admissions Coordinator [phone]

## 2020-12-19 NOTE — Progress Notes (Addendum)
PROGRESS NOTE     Alicia Guerrero, is a 85 y.o. female, DOB - 1935-03-13, WNU:[id number]  Admit date - 12/16/2020   Admitting Physician Reakwon Barren Denton Brick, MD  Outpatient Primary MD for the patient is [name], [name], MD  LOS - 2  Chief Complaint  Patient presents with   Code Stroke   Brief Summary:- 85 y.o. female with medical history significant for  atrial fibrillation with biventricular implantable cardiac defibrillator, ischemic cardiomyopathy, hypertension, hyperlipidemia, chronic systolic heart failure, prior stroke with left-sided weakness and a cardiac arrest in September 2013 admitted on 12/17/2020 with increased weakness on the left side concerning for new stroke superimposed on previous strokes and deficits  Assessment & Plan:   Principal Problem:   Acute ischemic stroke ([place]) Active Problems:   HYPERLIPIDEMIA, MIXED   CAD (coronary artery disease)   Atrial fibrillation ([place])   Ischemic cardiomyopathy   Essential hypertension   Type 2 diabetes mellitus ([place])   Acute CVA (cerebrovascular accident) ([place])   A/p  1)Worsening Left-sided weakness concerning for new acute stroke superimposed on previous stroke-- -- CT head on admission 12/16/20 was unrevealing, repeat CT head in the a.m. of 01/04/2021 also unrevealing -CTA head and neck without LVO, significant occlusion of the left M ICA M1 segment noted, along with short segment occlusion of the distal right PCA P2 segment -Unable to do MRI due to AICD in situ -Neurologic consult appreciated recommended repeat CT head after 24 hours -Increase atorvastatin to 80 mg daily for secondary stroke prevention -Continue Eliquis, neurology recommended adding aspirin 81 mg daily -BP goal 120 to 140 mmhg -patient has severe intracranial stenosis so avoid hypotension -LDL is 79 HDL is 42, T cholesterol 126 -Left-sided hemiparesis and speech and swallowing difficulties persist -PT OT and speech therapy eval appreciated -Recommend  [facility]--inpatient rehab -Patient and family agreeable  2)Chronic Atrial Fibrillation--- continue Eliquis with secondary stroke prophylaxis, bisoprolol for rate control  3)H/o CAD--ischemic cardiomyopathy, -Chest pain-free, no ACS type symptoms  Aspirin and Lipitor as above, continue bisoprolol  4)DM2--A1c 6.0 reflecting excellent diabetic control PTA -hold metformin Use Novolog/Humalog Sliding scale insulin with Accu-Cheks/Fingersticks as ordered   5)Dysphagia----pathology consult appreciated recommends dysphagia 1 diet with nectar thickened liquids  6)HTN--we initially allowed permissive hypertension for 24 hours then - -BP goal  Now is 120 to 140 mmhg -patient has severe intracranial stenosis so avoid hypotension C/n bisoprolol and Avapro BP not at goal, okay to increase amlodipine to 10 mg  - may use IV Hydralazine 10 mg  Every 4 hours Prn for systolic blood pressure over 160 mmhg  7) hypokalemia/hypomagnesemia--- replace and recheck  Disposition/Need for in-Hospital Stay- patient unable to be discharged at this time due to --awaiting transfer to [facility]--inpatient rehab*  Status is: Inpatient  Disposition: The patient is from: Home              Anticipated d/c is to: CIR              Anticipated d/c date is: 1 day              Patient currently is medically stable to d/c. Barriers: Awaiting bed at [facility]--inpatient rehab  Code Status :  -  Code Status: Full Code   Family Communication:    (patient is alert, awake and coherent)  Discussed with Son [name] at bedside  Consults  :  Neuro  DVT Prophylaxis  :   - SCDs   SCD's Start: 12/17/20 0235 apixaban (ELIQUIS) tablet 5 mg  Lab Results  Component Value Date   PLT 252 12/17/2020    Inpatient Medications  Scheduled Meds:  [START ON 12/20/2020] amLODipine  10 mg Oral Daily   amLODipine  5 mg Oral Once   apixaban  5 mg Oral BID   aspirin  81 mg Oral Daily   Or   aspirin  300 mg Rectal Daily   atorvastatin  80 mg Oral  Daily   bisoprolol  5 mg Oral Daily   cetirizine HCl  5 mg Oral Daily   insulin aspart  0-5 Units Subcutaneous QHS   insulin aspart  0-6 Units Subcutaneous TID WC   irbesartan  75 mg Oral Daily   potassium chloride  40 mEq Oral Q3H   Continuous Infusions: PRN Meds:.acetaminophen **OR** acetaminophen (TYLENOL) oral liquid 160 mg/5 mL **OR** acetaminophen, hydrALAZINE   Anti-infectives (From admission, onward)    None         Subjective: [name] today has no fevers, no emesis,  No chest pain,    -Son  at bedside trying to feed patient  -Swallowing difficulties persist  Objective: Vitals:   12/19/20 0800 12/19/20 0925 12/19/20 1330 12/19/20 1426  BP: (!) 175/82 (!) 173/81 (!) 169/74   Pulse: 68  68 64  Resp: 14  18 18   Temp:   98.8 F (37.1 C)   TempSrc:   Oral   SpO2: 94%  98% 95%  Weight:      Height:        Intake/Output Summary (Last 24 hours) at 12/19/2020 1732 Last data filed at 12/18/2020 2043 Gross per 24 hour  Intake 170 ml  Output --  Net 170 ml   Filed Weights   12/16/20 2312 12/17/20 0243  Weight: 66.2 kg 62.8 kg    Physical Exam  Gen:- Awake Alert, cooperative HEENT:- [place].AT, No sclera icterus Neck-Supple Neck,No JVD,.  Lungs-  CTAB , fair symmetrical air movement CV- S1, S2 normal, irregular Abd-  +ve B.Sounds, Abd Soft, No tenderness,    Extremity/Skin:- No  edema, pedal pulses present  Psych-affect is appropriate, oriented x3 Neuro- left-sided hemiparesis, mostly left upper extremity, speech and swallowing difficulties persist, facial asymmetry persist, no tremors  Data Reviewed: I have personally reviewed following labs and imaging studies  CBC: Recent Labs  Lab 12/16/20 2252 12/17/20 0616  WBC 9.7 9.2  NEUTROABS 6.0  --   HGB 12.9 12.4  HCT 39.4 38.2  MCV 85.5 84.3  PLT 270 539   Basic Metabolic Panel: Recent Labs  Lab 12/16/20 2252 12/17/20 0616 12/18/20 1320 12/19/20 0525  NA 141 140 141 141  K 2.7* 2.6* 3.0* 3.0*   CL 101 103 105 107  CO2 29 28 27 25   GLUCOSE 112* 105* 98 141*  BUN 13 9 11 13   CREATININE 0.69 0.50 0.60 0.62  CALCIUM 9.1 8.6* 9.4 9.0  MG  --  1.3* 1.7  --   PHOS  --  3.1 3.4  --    GFR: Estimated Creatinine Clearance: 44.1 mL/min (by C-G formula based on SCr of 0.62 mg/dL). Liver Function Tests: Recent Labs  Lab 12/16/20 2252 12/17/20 0616 12/18/20 1320  AST 32 30  --   ALT 30 29  --   ALKPHOS 67 64  --   BILITOT 1.0 0.9  --   PROT 6.3* 5.9*  --   ALBUMIN 3.9 3.7 3.8   No results for input(s): LIPASE, AMYLASE in the last 168 hours. No results for input(s): AMMONIA in  the last 168 hours. Coagulation Profile: Recent Labs  Lab 12/16/20 2252  INR 1.6*   Cardiac Enzymes: No results for input(s): CKTOTAL, CKMB, CKMBINDEX, TROPONINI in the last 168 hours. BNP (last 3 results) No results for input(s): PROBNP in the last 8760 hours. HbA1C: Recent Labs    12/17/20 0616  HGBA1C 6.0*   CBG: Recent Labs  Lab 12/18/20 1638 12/18/20 2050 12/19/20 0820 12/19/20 1200 12/19/20 1621  GLUCAP 141* 168* 114* 146* 196*   Lipid Profile: Recent Labs    12/17/20 0616  CHOL 136  HDL 42  LDLCALC 79  TRIG 76  CHOLHDL 3.2   Thyroid Function Tests: No results for input(s): TSH, T4TOTAL, FREET4, T3FREE, THYROIDAB in the last 72 hours. Anemia Panel: No results for input(s): VITAMINB12, FOLATE, FERRITIN, TIBC, IRON, RETICCTPCT in the last 72 hours. Urine analysis:    Component Value Date/Time   COLORURINE YELLOW 12/17/2020 0301   APPEARANCEUR CLEAR 12/17/2020 0301   LABSPEC 1.010 12/17/2020 0301   PHURINE 7.0 12/17/2020 0301   GLUCOSEU NEGATIVE 12/17/2020 0301   GLUCOSEU NEGATIVE 05/19/2015 0843   HGBUR NEGATIVE 12/17/2020 0301   BILIRUBINUR NEGATIVE 12/17/2020 0301   KETONESUR 15 (A) 12/17/2020 0301   PROTEINUR NEGATIVE 12/17/2020 0301   UROBILINOGEN 0.2 05/19/2015 0843   NITRITE NEGATIVE 12/17/2020 0301   LEUKOCYTESUR NEGATIVE 12/17/2020 0301   Sepsis  Labs: @LABRCNTIP (procalcitonin:4,lacticidven:4)  ) Recent Results (from the past 240 hour(s))  Resp Panel by RT-PCR (Flu A&B, Covid) Nasopharyngeal Swab     Status: None   Collection Time: 12/16/20 11:48 AM   Specimen: Nasopharyngeal Swab; Nasopharyngeal(NP) swabs in vial transport medium  Result Value Ref Range Status   SARS Coronavirus 2 by RT PCR NEGATIVE NEGATIVE Final    Comment: (NOTE) SARS-CoV-2 target nucleic acids are NOT DETECTED.  The SARS-CoV-2 RNA is generally detectable in upper respiratory specimens during the acute phase of infection. The lowest concentration of SARS-CoV-2 viral copies this assay can detect is 138 copies/mL. A negative result does not preclude SARS-Cov-2 infection and should not be used as the sole basis for treatment or other patient management decisions. A negative result may occur with  improper specimen collection/handling, submission of specimen other than nasopharyngeal swab, presence of viral mutation(s) within the areas targeted by this assay, and inadequate number of viral copies(<138 copies/mL). A negative result must be combined with clinical observations, patient history, and epidemiological information. The expected result is Negative.  Fact Sheet for Patients:  EntrepreneurPulse.com.au  Fact Sheet for Healthcare Providers:  IncredibleEmployment.be  This test is no t yet approved or cleared by the [place] FDA and  has been authorized for detection and/or diagnosis of SARS-CoV-2 by FDA under an Emergency Use Authorization (EUA). This EUA will remain  in effect (meaning this test can be used) for the duration of the COVID-19 declaration under Section 564(b)(1) of the Act, 21 U.S.C.section 360bbb-3(b)(1), unless the authorization is terminated  or revoked sooner.       Influenza A by PCR NEGATIVE NEGATIVE Final   Influenza B by PCR NEGATIVE NEGATIVE Final    Comment: (NOTE) The Xpert  Xpress SARS-CoV-2/FLU/RSV plus assay is intended as an aid in the diagnosis of influenza from Nasopharyngeal swab specimens and should not be used as a sole basis for treatment. Nasal washings and aspirates are unacceptable for Xpert Xpress SARS-CoV-2/FLU/RSV testing.  Fact Sheet for Patients: EntrepreneurPulse.com.au  Fact Sheet for Healthcare Providers: IncredibleEmployment.be  This test is not yet approved or cleared by the [place]  FDA and has been authorized for detection and/or diagnosis of SARS-CoV-2 by FDA under an Emergency Use Authorization (EUA). This EUA will remain in effect (meaning this test can be used) for the duration of the COVID-19 declaration under Section 564(b)(1) of the Act, 21 U.S.C. section 360bbb-3(b)(1), unless the authorization is terminated or revoked.  Performed at [facility], [address] [place], [place] [address]       Radiology Studies: CT HEAD WO CONTRAST (5MM)  Result Date: 12/18/2020 CLINICAL DATA:  Neuro deficit, acute, stroke suspected; left-sided weakness EXAM: CT HEAD WITHOUT CONTRAST TECHNIQUE: Contiguous axial images were obtained from the base of the skull through the vertex without intravenous contrast. COMPARISON:  12/16/2020 FINDINGS: Brain: There is no acute intracranial hemorrhage, mass effect, or edema. No new loss of gray-white differentiation. Chronic infarcts of the left frontoparietal lobes. Small chronic left thalamic infarct. Additional nonspecific patchy and confluent hypoattenuation likely reflects stable chronic microvascular ischemic changes. There is no extra-axial fluid collection. Ventricles and sulci are within normal limits in size and configuration. Vascular: There is atherosclerotic calcification at the skull base. Skull: Calvarium is unremarkable. Sinuses/Orbits: No acute finding. Other: None. IMPRESSION: No acute intracranial hemorrhage or evidence of evolving recent  infarction. Stable chronic findings detailed above. Electronically Signed   By: Macy Mis M.D.   On: 12/18/2020 11:15   ECHOCARDIOGRAM COMPLETE  Result Date: 12/18/2020    ECHOCARDIOGRAM REPORT   Patient Name:   Alicia Guerrero Date of Exam: 12/18/2020 Medical Rec #:  [id number]       Height:       63.0 in Accession #:    [id number]      Weight:       138.4 lb Date of Birth:  1935/02/03       BSA:          1.654 m Patient Age:    48 years        BP:           161/76 mmHg Patient Gender: F               HR:           65 bpm. Exam Location:  [facility] Procedure: 2D Echo, Cardiac Doppler and Color Doppler Indications:    Stroke I63.9  History:        Patient has prior history of Echocardiogram examinations, most                 recent 06/26/2018. CAD and Previous Myocardial Infarction,                 Defibrillator, Stroke, Arrythmias:Atrial Fibrillation; Risk                 Factors:Hypertension, Dyslipidemia and Diabetes.  Sonographer:    Alvino Chapel RCS Referring Phys: [id number] OLADAPO ADEFESO IMPRESSIONS  1. LVEF is depressed with hypokinesis of the mid/distal lateral, apical, distal anterior walls Compared to previous echo, changes are new . Left ventricular ejection fraction, by estimation, is 40%%. The left ventricle has moderately decreased function.  There is mild left ventricular hypertrophy. Left ventricular diastolic parameters are consistent with Grade I diastolic dysfunction (impaired relaxation).  2. Right ventricular systolic function is normal. The right ventricular size is normal.  3. Trivial mitral valve regurgitation.  4. The aortic valve is abnormal. Aortic valve regurgitation is not visualized. Aortic valve sclerosis/calcification is present, without any evidence of aortic stenosis.  5. The inferior vena cava is normal  in size with greater than 50% respiratory variability, suggesting right atrial pressure of 3 mmHg. FINDINGS  Left Ventricle: LVEF is depressed with hypokinesis of the  mid/distal lateral, apical, distal anterior walls Compared to previous echo, changes are new. Left ventricular ejection fraction, by estimation, is 40%%. The left ventricle has moderately decreased function. The left ventricular internal cavity size was normal in size. There is mild left ventricular hypertrophy. Left ventricular diastolic parameters are consistent with Grade I diastolic dysfunction (impaired relaxation). Right Ventricle: The right ventricular size is normal. Right vetricular wall thickness was not assessed. Right ventricular systolic function is normal. Left Atrium: Left atrial size was normal in size. Right Atrium: Right atrial size was normal in size. Pericardium: There is no evidence of pericardial effusion. Mitral Valve: There is mild thickening of the mitral valve leaflet(s). Mild to moderate mitral annular calcification. Trivial mitral valve regurgitation. Tricuspid Valve: The tricuspid valve is normal in structure. Tricuspid valve regurgitation is trivial. Aortic Valve: The aortic valve is abnormal. Aortic valve regurgitation is not visualized. Aortic valve sclerosis/calcification is present, without any evidence of aortic stenosis. Pulmonic Valve: The pulmonic valve was not well visualized. Pulmonic valve regurgitation is not visualized. Aorta: The aortic root is normal in size and structure. Venous: The inferior vena cava is normal in size with greater than 50% respiratory variability, suggesting right atrial pressure of 3 mmHg. IAS/Shunts: No atrial level shunt detected by color flow Doppler. Additional Comments: A device lead is visualized.  LEFT VENTRICLE PLAX 2D LVIDd:         4.70 cm   Diastology LVIDs:         3.80 cm   LV e' medial:    3.26 cm/s LV PW:         0.90 cm   LV E/e' medial:  13.1 LV IVS:        1.30 cm   LV e' lateral:   5.33 cm/s LVOT diam:     1.80 cm   LV E/e' lateral: 8.0 LV SV:         50 LV SV Index:   30 LVOT Area:     2.54 cm  RIGHT VENTRICLE RV S prime:      16.30 cm/s TAPSE (M-mode): 2.1 cm LEFT ATRIUM             Index        RIGHT ATRIUM           Index LA diam:        3.30 cm 2.00 cm/m   RA Area:     13.20 cm LA Vol (A2C):   59.9 ml 36.22 ml/m  RA Volume:   33.70 ml  20.38 ml/m LA Vol (A4C):   39.8 ml 24.07 ml/m LA Biplane Vol: 49.7 ml 30.05 ml/m  AORTIC VALVE LVOT Vmax:   85.60 cm/s LVOT Vmean:  57.400 cm/s LVOT VTI:    0.197 m  AORTA Ao Root diam: 3.30 cm MITRAL VALVE MV Area (PHT): 2.11 cm    SHUNTS MV Decel Time: 359 msec    Systemic VTI:  0.20 m MV E velocity: 42.80 cm/s  Systemic Diam: 1.80 cm MV A velocity: 80.50 cm/s MV E/A ratio:  0.53 Dorris Carnes MD Electronically signed by Dorris Carnes MD Signature Date/Time: 12/18/2020/3:37:30 PM    Final      Scheduled Meds:  Derrill Memo ON 12/20/2020] amLODipine  10 mg Oral Daily   amLODipine  5 mg Oral Once   apixaban  5 mg Oral BID   aspirin  81 mg Oral Daily   Or   aspirin  300 mg Rectal Daily   atorvastatin  80 mg Oral Daily   bisoprolol  5 mg Oral Daily   cetirizine HCl  5 mg Oral Daily   insulin aspart  0-5 Units Subcutaneous QHS   insulin aspart  0-6 Units Subcutaneous TID WC   irbesartan  75 mg Oral Daily   potassium chloride  40 mEq Oral Q3H   Continuous Infusions:   LOS: 2 days    Roxan Hockey M.D on 12/19/2020 at 5:32 PM  Go to [email or web address] - for contact info  [facility] - Office  [phone]  If 7PM-7AM, please contact night-coverage [email or web address] Password [facility] 12/19/2020, 5:32 PM

## 2020-12-19 NOTE — PMR Pre-admission (Signed)
PMR Admission Coordinator Pre-Admission Assessment  Patient: Alicia Guerrero is an 85 y.o., female MRN: [id number] DOB: [birth year] Height: 5' 2.99" (160 cm) Weight: 62.8 kg  Insurance Information HMO:     PPO: yes     PCP:      IPA:      80/20:      OTHER:  PRIMARY: [employer] Medicare      Policy#: [id number]      Subscriber: patient CM Name: Dewitt Rota       Phone#: [phone]    Fax#: [phone] I received approval for admission on 12/21/20 for admission 12/22/10-12/27/20 with updates due to [facility] 18/56/3 Pre-Cert#: [id number]      Employer:  Benefits:  Phone #: [phone]     Name:  Eff. Date: 01/17/20-still active      Deduct: does not have one Out of Pocket Max: $4,500  ($395 met)    Life Max: NA CIR: $295/day co-pay with max co-pay of $1,770/admission      SNF: 100% coverage days 1-20, $188/day co-pay days 21-100 Outpatient: $35/visit co-pay     Co-Pay:  Home Health: 100% coverage      Co-Pay:  DME: 80% coverage      Co-Pay: 20% co-insurance Providers: in-network SECONDARY:       Policy#:      Phone#:   Development worker, community:       Phone#:   The Engineer, petroleum" for patients in Inpatient Rehabilitation Facilities with attached "Privacy Act [place] Records" was provided and verbally reviewed with: Patient  Emergency Contact Information Contact Information     Name Relation Home Work Mobile   [place] Son [phone]  [phone]   [place], Partch [phone]  [phone]   Jailyne, [place] [phone]  [phone]       Current Medical History  Patient Admitting Diagnosis: acute ischemic stroke  History of Present Illness: Pt is an 85 year old female with medical hx significant for: A-fib with biventricular ICD, cardiac arrest (09/2011), HLD, HTN,CAD,  chronic systolic heart failure, ischemic cardiomyopathy, h/o stroke with left-sided weakness. Pt presented to the hospital on 12/16/20 as a code stroke. Pt noted to have bilateral  leg weakness, left arm weakness and slurred speech. Pt was not a tPA candidate. Thrombectomy was also not recommended. CT head showed no acute abnormality. CTA head/neck without LVO but showed severe stenosis of left MCA M1 segment and short segment occlusion of distal right PCA P2 segment.  Pt not able to have MRI d/t defibrillator. Repeat CT head also unrevealing. Therapy evaluations completed and CIR recommended d/t pt's deficits in functional mobility and ability complete ADLs independently.  Complete NIHSS TOTAL: 11  Patient's medical record from [facility] has been reviewed by the rehabilitation admission coordinator and physician.  Past Medical History  Past Medical History:  Diagnosis Date   Allergic rhinitis due to pollen    Asthma    Atrial fibrillation ([place])    Biventricular ICD (implantable cardiac defibrillator) in place    St Jude 10/2011 (implant MD: Allred)   Bradycardia    a. coreg tapered off 05/2011 => b. Metoprolol restarted after cardiac arrest 09/2011   CAD (coronary artery disease)    a. s/p stent to RCA 1996;  b. s/p cutting balloon PTCA to LAD 2001; c. s/p Cypher DES to LAD 2003;  d. NSTEMI 4/12: DES to dRCA;  e.  [place] 9/13 (after presentation with cardiac arrest and + Nz's for NSTEMI): pLAD 30% ISR, oD1 30%, dLAD  60-70%, pOM1 (small vessel) 90%, oOM2 95% (moderate to large vessel), mCFX 30%, pOM3 30-40%, dRCA stents and pPDA stents patent with 30% ISR => med Rx rec.    Cancer [facility])    Cardiac arrest ([place]) 09/2011   a. VT/VF in community; resusc unsuccessful;  PEA in ED; multiple defibs => revived;  [employer];  c/b by VDRF, cardiogenic shock;  LHC with stable anatomy => med Rx;  difficult ween from vent => s/p trach;  d/c to SNF   Chronic eczema    hands   Chronic systolic heart failure (H[facility])    Hemorrhoids    HLD (hyperlipidemia)    HTN (hypertension)    Ischemic cardiomyopathy    a. EF 40% at cath 4/12 with AL and Inf HK;  b.  Echo after  presentation with cardiac arrest 9/13:  EF 40-45% and severe inf HK to AK c/w infarction, PASP 42   Left bundle branch block    Other and unspecified ovarian cyst    Other atopic dermatitis and related conditions    Personal history of hyperthyroidism    Personal history of malignant neoplasm of breast     Has the patient had major surgery during 100 days prior to admission? Yes  Family History   family history includes Clotting disorder in her brother; Coronary artery disease in her father; Endometrial cancer in her sister; Heart disease in her brother, brother, and sister; Hypertension in her mother; Uterine cancer in her mother.  Current Medications  Current Facility-Administered Medications:    acetaminophen (TYLENOL) tablet 650 mg, 650 mg, Oral, Q4H PRN, 650 mg at 12/17/20 2041 **OR** acetaminophen (TYLENOL) 160 MG/5ML solution 650 mg, 650 mg, Per Tube, Q4H PRN **OR** acetaminophen (TYLENOL) suppository 650 mg, 650 mg, Rectal, Q4H PRN, Adefeso, Oladapo, DO   amLODipine (NORVASC) tablet 10 mg, 10 mg, Oral, Daily, Emokpae, Courage, MD, 10 mg at 12/22/20 0109   apixaban (ELIQUIS) tablet 5 mg, 5 mg, Oral, BID, Hortense Ramal, Priyanka O, MD, 5 mg at 12/22/20 3235   aspirin chewable tablet 81 mg, 81 mg, Oral, Daily, 81 mg at 12/22/20 0928 **OR** [DISCONTINUED] aspirin suppository 300 mg, 300 mg, Rectal, Daily, Hortense Ramal, Priyanka O, MD   atorvastatin (LIPITOR) tablet 80 mg, 80 mg, Oral, Daily, Hortense Ramal, Priyanka O, MD, 80 mg at 12/22/20 0928   bisoprolol (ZEBETA) tablet 5 mg, 5 mg, Oral, Daily, Emokpae, Courage, MD, 5 mg at 12/22/20 5732   hydrALAZINE (APRESOLINE) injection 10 mg, 10 mg, Intravenous, Q6H PRN, Adefeso, Oladapo, DO, 10 mg at 12/19/20 0019   insulin aspart (novoLOG) injection 0-5 Units, 0-5 Units, Subcutaneous, QHS, Emokpae, Courage, MD   insulin aspart (novoLOG) injection 0-6 Units, 0-6 Units, Subcutaneous, TID WC, Emokpae, Courage, MD, 1 Units at 12/21/20 1634   irbesartan (AVAPRO) tablet 75  mg, 75 mg, Oral, Daily, Emokpae, Courage, MD, 75 mg at 12/22/20 0928   loratadine (CLARITIN) tablet 10 mg, 10 mg, Oral, Daily, Hart Robinsons A, RPH, 10 mg at 12/22/20 2025  Patients Current Diet:  Diet Order             DIET - DYS 1 Room service appropriate? Yes; Fluid consistency: Nectar Thick  Diet effective now                   Precautions / Restrictions Precautions Precautions: Fall Precaution Comments: LUE hemiplegia Restrictions Weight Bearing Restrictions: No   Has the patient had 2 or more falls or a fall with injury in the past year?  No  Prior Activity Level Household: raely getst out of house since COVID  Prior Functional Level Self Care: Did the patient need help bathing, dressing, using the toilet or eating? Independent  Indoor Mobility: Did the patient need assistance with walking from room to room (with or without device)? Independent  Stairs: Did the patient need assistance with internal or external stairs (with or without device)? Independent  Functional Cognition: Did the patient need help planning regular tasks such as shopping or remembering to take medications? Needed some help  Patient Information Are you of Hispanic, Latino/a,or Spanish origin?: X. Patient unable to respond, A. No, not of Hispanic, Latino/a, or Spanish origin What is your race?: X. Patient unable to respond, A. White Do you need or want an interpreter to communicate with a doctor or health care staff?: 9. Unable to respond  Patient's Response To:  Health Literacy and Transportation Is the patient able to respond to health literacy and transportation needs?: No Health Literacy - How often do you need to have someone help you when you read instructions, pamphlets, or other written material from your doctor or pharmacy?: Patient unable to respond In the past 12 months, has lack of transportation kept you from medical appointments or from getting medications?: No (son answered) In the  past 12 months, has lack of transportation kept you from meetings, work, or from getting things needed for daily living?: No (son answered)  Development worker, international aid / [place] Devices/Equipment: None Home Equipment: Conservation officer, nature (2 wheels), Wheelchair - manual, BSC/3in1  Prior Device Use: Indicate devices/aids used by the patient prior to current illness, exacerbation or injury? None of the above  Current Functional Level Cognition  Arousal/Alertness: Awake/alert Overall Cognitive Status: Within Functional Limits for tasks assessed Orientation Level: Oriented to person, Disoriented to place, Disoriented to time, Disoriented to situation General Comments: Pt alert and oriented, conversational with some slurred speech Attention: Sustained Sustained Attention: Impaired Sustained Attention Impairment: Verbal basic, Functional basic Memory: Impaired Memory Impairment: Retrieval deficit, Decreased recall of new information, Decreased short term memory Decreased Short Term Memory: Verbal basic, Functional basic Awareness: Impaired Awareness Impairment: Emergent impairment, Intellectual impairment Problem Solving: Impaired Problem Solving Impairment: Verbal basic, Functional basic Executive Function: Organizing, Decision Making Organizing: Impaired Organizing Impairment: Verbal basic, Functional basic Decision Making: Impaired Decision Making Impairment: Verbal basic, Functional basic Behaviors: Impulsive Safety/Judgment: Impaired    Extremity Assessment (includes Sensation/Coordination)  Upper Extremity Assessment: Defer to OT evaluation RUE Deficits / Details: General weakness. LUE Deficits / Details: 2+/5 shoulder MMT with poor gross motor coordination and fine motor. Pt able to demosntrate near [facility] elbow A/ROM but with slow coordination. 3+/5 MMT for composit grip. LUE Sensation: WNL LUE Coordination: decreased fine motor, decreased gross motor  Lower Extremity  Assessment: RLE deficits/detail, LLE deficits/detail RLE Deficits / Details: grossly 4+/5 LLE Deficits / Details: hip flexion 4/5, knee/ankle 4+/5    ADLs  Overall ADL's : Needs assistance/impaired Eating/Feeding: Set up, Minimal assistance, Sitting Eating/Feeding Details (indicate cue type and reason): Per clinical judgment. Grooming: Minimal assistance, Moderate assistance, Sitting Grooming Details (indicate cue type and reason): Per clinical judgment. Upper Body Bathing: Minimal assistance, Sitting, Min guard Upper Body Bathing Details (indicate cue type and reason): Per clinical judgment. Lower Body Bathing: Moderate assistance, Maximal assistance, Sitting/lateral leans Lower Body Bathing Details (indicate cue type and reason): Per clinical judgment. Upper Body Dressing : Min guard, Minimal assistance, Sitting Upper Body Dressing Details (indicate cue type and reason): Per clinical judgment.  Lower Body Dressing: Moderate assistance, Maximal assistance, Sitting/lateral leans Lower Body Dressing Details (indicate cue type and reason): Per clinical judgment. Toilet Transfer: Moderate assistance, Rolling walker (2 wheels), Stand-pivot, Maximal assistance Toilet Transfer Details (indicate cue type and reason): Partially simulated via sit to stand and attempt for lateral steps. Toileting- Clothing Manipulation and Hygiene: Minimal assistance, Moderate assistance, Sitting/lateral lean Toileting - Clothing Manipulation Details (indicate cue type and reason): Per clinical judgment. Tub/ Shower Transfer: Maximal assistance, Stand-pivot, Rolling walker (2 wheels) Tub/Shower Transfer Details (indicate cue type and reason): Per clinical judgment. Functional mobility during ADLs: Moderate assistance, Maximal assistance, Rolling walker (2 wheels) General ADL Comments: Pt demonstrates slow movement with quick fatigue and report of dizziness and need to sit. Pt limited by poor balance, postural control,  and L UE functional use.    Mobility  Overal bed mobility: Needs Assistance Bed Mobility: Rolling, Supine to Sit Rolling: Min assist, Supervision Supine to sit: Mod assist Sit to supine: Mod assist, Max assist General bed mobility comments: supv to roll supine to L sidelying, able to place RLE into hooklying and pull with BUE on bedrail; min A to roll into R sidelying, able to place LLE into hooklying and requires assist to rotate trunk to R due to LUE weakness; heavy VC for hand placement and sequencing with sitting up at EOB, mod A for trunk uprighting and scooting out to EOB with use of bedpad    Transfers  Overall transfer level: Needs assistance Equipment used: 1 person hand held assist Transfers: Sit to/from Stand, Bed to chair/wheelchair/BSC Sit to Stand: Mod assist Bed to/from chair/wheelchair/BSC transfer type:: Step pivot Step pivot transfers: Mod assist, +2 safety/equipment General transfer comment: therapist anterior to pt in "bear hug" position, blocking BLE knees, slow to power to stand with VC for sequencing, mod A +2 for safety with pivot over to recliner, able to reach with RUE for recliner, significantly increased time, VC for upright posture due to flexed trunk preferred    Ambulation / Gait / Stairs / Wheelchair Mobility  Ambulation/Gait Ambulation/Gait assistance: Mod assist, Max assist Gait Distance (Feet): 5 Feet Assistive device: Rolling walker (2 wheels) Gait Pattern/deviations: Decreased step length - left, Decreased step length - right, Decreased stance time - left, Decreased stride length General Gait Details: able to slide LLE over to recliner, weakness and blocking of BLE and multimodal cues for upright posture due to weakness and fatigue Gait velocity: decreased    Posture / Balance Dynamic Sitting Balance Sitting balance - Comments: mod A to maintain static sitting EOB with propping on RUE, LOB R requiring assist to return to upright  posture Balance Overall balance assessment: Needs assistance Sitting-balance support: Feet supported, Bilateral upper extremity supported Sitting balance-Leahy Scale: Poor Sitting balance - Comments: mod A to maintain static sitting EOB with propping on RUE, LOB R requiring assist to return to upright posture Postural control: Right lateral lean Standing balance support: During functional activity, Bilateral upper extremity supported, Reliant on assistive device for balance Standing balance-Leahy Scale: Poor Standing balance comment: using RW    Special needs/care consideration Skin Ecchymosis: arm/right, External Urinary Catheter, Bowel and Bladder incontinence and Diabetic management novoLOG 0-5 units daily at bedtime; novoLOG 0-6 units 3x daily with meals   Previous Home Environment (from acute therapy documentation) Living Arrangements: Spouse/significant other  Lives With: Spouse Available Help at Discharge: Family, Available 24 hours/day Type of Home: House Home Layout: One level Home Access: Stairs to enter Entrance Stairs-Rails: None Entrance [employer]  of Steps: 1 Bathroom Shower/Tub: Gaffer, Chiropodist: Standard Bathroom Accessibility: Yes How Accessible: Accessible via walker Home Care Services: No Additional Comments: Pt lives with husband but husband is unable to help very much due to own physical limitations. Son assists once a week per report.  Discharge Living Setting Plans for Discharge Living Setting: Patient's home Type of Home at Discharge: House Discharge Home Layout: One level Discharge Home Access: Stairs to enter Entrance Stairs-Rails: None Entrance Stairs-Number of Steps: 1 Discharge Bathroom Shower/Tub: Walk-in shower, Tub/shower unit Discharge Bathroom Toilet: Standard Discharge Bathroom Accessibility: Yes How Accessible: Accessible via walker Does the patient have any problems obtaining your medications?:  No  Social/Family/Support Systems Anticipated Caregiver: Brittanny Levenhagen (son) and other family Anticipated Caregiver's Contact Information: [phone] Caregiver Availability: 24/7 Discharge Plan Discussed with Primary Caregiver: Yes Is Caregiver In Agreement with Plan?: Yes Does Caregiver/Family have Issues with Lodging/Transportation while Pt is in Rehab?: No  Goals Patient/Family Goal for Rehab: Supervision: PT/OT, Supervision-Min A: ST Expected length of stay: 14-16 days Pt/Family Agrees to Admission and willing to participate: Yes Program Orientation Provided & Reviewed with Pt/Caregiver Including Roles  & Responsibilities: Yes  Decrease burden of Care through IP rehab admission: NA  Possible need for SNF placement upon discharge: Not anticipated  Patient Condition: I have reviewed medical records from [facility], spoken with CM, and son. I discussed via phone for inpatient rehabilitation assessment.  Patient will benefit from ongoing PT, OT, and SLP, can actively participate in 3 hours of therapy a day 5 days of the week, and can make measurable gains during the admission.  Patient will also benefit from the coordinated team approach during an Inpatient Acute Rehabilitation admission.  The patient will receive intensive therapy as well as Rehabilitation physician, nursing, social worker, and care management interventions.  Due to bladder management, bowel management, safety, skin/wound care, disease management, medication administration, and patient education the patient requires 24 hour a day rehabilitation nursing.  The patient is currently mod-max A with mobility and basic ADLs.  Discharge setting and therapy post discharge at home with home health is anticipated.  Patient has agreed to participate in the Acute Inpatient Rehabilitation Program and will admit today.  Preadmission Screen Completed By:  Bethel Born, 12/22/2020 10:36 AM with day of updates by Clemens Catholic, MS, CCC-SLP  ______________________________________________________________________   Discussed status with Dr. Ranell Patrick  on 12/22/20 at 5 and received approval for admission today.  Admission Coordinator:  Bethel Born, CCC-SLP, time 930/Date 12/22/20   Assessment/Plan: Diagnosis: Acute ischemic stroke Does the need for close, 24 hr/day Medical supervision in concert with the patient's rehab needs make it unreasonable for this patient to be served in a less intensive setting? Yes Co-Morbidities requiring supervision/potential complications: HLD, CAD, atrial fibrillation, essential hypertension, type 2 DM Due to bladder management, bowel management, safety, skin/wound care, disease management, medication administration, pain management, and patient education, does the patient require 24 hr/day rehab nursing? Yes Does the patient require coordinated care of a physician, rehab nurse, PT, OT, and SLP to address physical and functional deficits in the context of the above medical diagnosis(es)? Yes Addressing deficits in the following areas: balance, endurance, locomotion, strength, transferring, bowel/bladder control, bathing, dressing, feeding, grooming, toileting, cognition, speech, swallowing, and psychosocial support Can the patient actively participate in an intensive therapy program of at least 3 hrs of therapy 5 days a week? Yes The potential for patient to make measurable gains while  on inpatient rehab is excellent Anticipated functional outcomes upon discharge from inpatient rehab: supervision PT, supervision OT, supervision SLP Estimated rehab length of stay to reach the above functional goals is: 10-14 days Anticipated discharge destination: Home 10. Overall Rehab/Functional Prognosis: excellent   MD Signature: Leeroy Cha, MD

## 2020-12-19 NOTE — Plan of Care (Signed)
  Problem: Education: Goal: Knowledge of General Education information will improve Description: Including pain rating scale, medication(s)/side effects and non-pharmacologic comfort measures Outcome: Progressing   Problem: Clinical Measurements: Goal: Will remain free from infection Outcome: Progressing Goal: Cardiovascular complication will be avoided Outcome: Progressing   Problem: Activity: Goal: Risk for activity intolerance will decrease Outcome: Progressing   Problem: Coping: Goal: Level of anxiety will decrease Outcome: Progressing   Problem: Elimination: Goal: Will not experience complications related to bowel motility Outcome: Progressing Goal: Will not experience complications related to urinary retention Outcome: Progressing   Problem: Pain Managment: Goal: General experience of comfort will improve Outcome: Progressing   Problem: Safety: Goal: Ability to remain free from injury will improve Outcome: Progressing   Problem: Clinical Measurements: Goal: Ability to maintain clinical measurements within normal limits will improve Outcome: Not Progressing

## 2020-12-20 LAB — BASIC METABOLIC PANEL
Anion gap: 9 (ref 5–15)
BUN: 15 mg/dL (ref 8–23)
CO2: 23 mmol/L (ref 22–32)
Calcium: 9.1 mg/dL (ref 8.9–10.3)
Chloride: 108 mmol/L (ref 98–111)
Creatinine, Ser: 0.49 mg/dL (ref 0.44–1.00)
GFR, Estimated: >60 mL/min (ref >60)
Glucose, Bld: 127 mg/dL — ABNORMAL HIGH (ref 70–99)
Potassium: 3.3 mmol/L — ABNORMAL LOW (ref 3.5–5.1)
Sodium: 140 mmol/L (ref 135–145)

## 2020-12-20 LAB — GLUCOSE, CAPILLARY
Glucose-Capillary: 120 mg/dL — ABNORMAL HIGH (ref 70–99)
Glucose-Capillary: 168 mg/dL — ABNORMAL HIGH (ref 70–99)
Glucose-Capillary: 192 mg/dL — ABNORMAL HIGH (ref 70–99)
Glucose-Capillary: 153 mg/dL — ABNORMAL HIGH (ref 70–99)

## 2020-12-20 MED ORDER — LORATADINE 10 MG PO TABS
10.0000 mg | ORAL_TABLET | Freq: Every day | ORAL | Status: DC
  Administered 2020-12-20 – 2020-12-22 (×3): 10 mg via ORAL
  Filled 2020-12-20 (×3): qty 1

## 2020-12-20 MED ORDER — POTASSIUM CHLORIDE 20 MEQ PO PACK
40.0000 meq | PACK | ORAL | Status: AC
  Administered 2020-12-20 (×2): 40 meq via ORAL
  Filled 2020-12-20 (×2): qty 2

## 2020-12-20 NOTE — Plan of Care (Incomplete)
°  Problem: Education: Goal: Knowledge of General Education information will improve Description: Including pain rating scale, medication(s)/side effects and non-pharmacologic comfort measures Outcome: Progressing   Problem: Health Behavior/Discharge Planning: Goal: Ability to manage health-related needs will improve Outcome: Progressing   Problem: Clinical Measurements: Goal: Ability to maintain clinical measurements within normal limits will improve Outcome: Progressing Goal: Will remain free from infection Outcome: Progressing Goal: Diagnostic test results will improve Outcome: Progressing Goal: Respiratory complications will improve Outcome: Progressing Goal: Cardiovascular complication will be avoided Outcome: Progressing   Problem: Activity: Goal: Risk for activity intolerance will decrease Outcome: Progressing   Problem: Nutrition: Goal: Adequate nutrition will be maintained Outcome: Progressing   Problem: Coping: Goal: Level of anxiety will decrease Outcome: Progressing   Problem: Elimination: Goal: Will not experience complications related to bowel motility Outcome: Progressing Goal: Will not experience complications related to urinary retention Outcome: Progressing   Problem: Pain Managment: Goal: General experience of comfort will improve Outcome: Progressing   Problem: Safety: Goal: Ability to remain free from injury will improve Outcome: Progressing   Problem: Skin Integrity: Goal: Risk for impaired skin integrity will decrease Outcome: Progressing   Problem: Education: Goal: Knowledge of secondary prevention will improve ([facility] ALL) Outcome: Progressing   

## 2020-12-20 NOTE — Progress Notes (Signed)
Physical Therapy Treatment Patient Details Name: Alicia Guerrero MRN: [id number] DOB: September 09, 1935 Today's Date: 12/20/2020   History of Present Illness Alicia Guerrero is an 85 y.o. female with medical history significant for  atrial fibrillation with biventricular implantable cardiac defibrillator, ischemic cardiomyopathy, hypertension, hyperlipidemia, chronic systolic heart failure, prior stroke with left-sided weakness and a cardiac arrest in September 2013 who presents to the emergency department due to increased left-sided weakness (from baseline) which started yesterday in the afternoon.  Patient was unable to provide history due to slurred speech, history was obtained from ED physician, ED medical record and son at bedside.  Per report, patient noted increased leg weakness around 4:30 PM when she tried to get up and she also noted increased left upper extremity from baseline.  Slurred speech was noted.  EMS was activated and patient was taken to the ED for further evaluation and management.  She denies chest pain, shortness of breath, headache, vision changes.    PT Comments    Patient demonstrates slightly increased strength BLE for stand and taking a few side steps, required left hand held to RW due to poor grip strength, tolerated standing for up to 6-7 minutes while attempting marching in place with poor carryover for lifting LLE due to weakness and tolerated sitting up in chair with her son present in room after therapy.  Patient will benefit from continued skilled physical therapy in hospital and recommended venue below to increase strength, balance, endurance for safe ADLs and gait.     Recommendations for follow up therapy are one component of a multi-disciplinary discharge planning process, led by the attending physician.  Recommendations may be updated based on patient status, additional functional criteria and insurance authorization.  Follow Up Recommendations  Acute inpatient rehab  (3hours/day)     Assistance Recommended at Discharge Intermittent Supervision/Assistance  Equipment Recommendations  None recommended by PT    Recommendations for Other Services       Precautions / Restrictions Precautions Precautions: Fall Restrictions Weight Bearing Restrictions: No     Mobility  Bed Mobility Overal bed mobility: Needs Assistance Bed Mobility: Supine to Sit     Supine to sit: Mod assist     General bed mobility comments: increased time, labored movement, unable to use LUE due to weakness    Transfers Overall transfer level: Needs assistance Equipment used: Rolling walker (2 wheels) Transfers: Sit to/from Stand;Bed to chair/wheelchair/BSC Sit to Stand: Min assist;Mod assist     Step pivot transfers: Mod assist     General transfer comment: able to grip RW with right hand, had to hold left hand onto RW with tactile assistance    Ambulation/Gait Ambulation/Gait assistance: Mod assist;Max assist Gait Distance (Feet): 5 Feet Assistive device: Rolling walker (2 wheels) Gait Pattern/deviations: Decreased step length - left;Decreased step length - right;Decreased stance time - left;Decreased stride length Gait velocity: decreased     General Gait Details: limited to a few slow unsteady side steps with buckling of left knee and difficulty advancing LLE due to weakness, unable to grip RW with left hand requrid tactile assistance to hold left hand onto walker   Stairs             Wheelchair Mobility    Modified Rankin (Stroke Patients Only)       Balance Overall balance assessment: Needs assistance Sitting-balance support: Feet supported;No upper extremity supported Sitting balance-Leahy Scale: Poor Sitting balance - Comments: fair poor with frequent leaning/falling over to the right and backwards  Postural control: Posterior lean;Right lateral lean Standing balance support: Reliant on assistive device for balance;During functional  activity;Bilateral upper extremity supported Standing balance-Leahy Scale: Poor Standing balance comment: using RW                            Cognition Arousal/Alertness: Awake/alert Behavior During Therapy: WFL for tasks assessed/performed Overall Cognitive Status: Within Functional Limits for tasks assessed                                          Exercises General Exercises - Lower Extremity Long Arc Quad: Seated;AROM;AAROM;Strengthening;Both;10 reps Hip Flexion/Marching: Seated;AROM;AAROM;Strengthening;Both;10 reps Toe Raises: Seated;AROM;AAROM;Strengthening;Both;10 reps Heel Raises: Seated;AROM;Strengthening;AAROM;Both;10 reps    General Comments        Pertinent Vitals/Pain Pain Assessment: No/denies pain    Home Living                          Prior Function            PT Goals (current goals can now be found in the care plan section) Acute Rehab PT Goals Patient Stated Goal: Return home after rehab PT Goal Formulation: With patient/family Time For Goal Achievement: 12/31/20 Potential to Achieve Goals: Good Progress towards PT goals: Progressing toward goals    Frequency    Min 4X/week      PT Plan Current plan remains appropriate    Co-evaluation              AM-PAC PT "6 Clicks" Mobility   Outcome Measure  Help needed turning from your back to your side while in a flat bed without using bedrails?: A Little Help needed moving from lying on your back to sitting on the side of a flat bed without using bedrails?: A Lot Help needed moving to and from a bed to a chair (including a wheelchair)?: A Lot Help needed standing up from a chair using your arms (e.g., wheelchair or bedside chair)?: A Lot Help needed to walk in hospital room?: A Lot Help needed climbing 3-5 steps with a railing? : A Lot 6 Click Score: 13    End of Session   Activity Tolerance: Patient tolerated treatment well;Patient limited by  fatigue Patient left: in chair;with family/visitor present Nurse Communication: Mobility status PT Visit Diagnosis: Unsteadiness on feet (R26.81);Other abnormalities of gait and mobility (R26.89);Muscle weakness (generalized) (M62.81);Other symptoms and signs involving the nervous system (R29.898)     Time: 7253-6644 PT Time Calculation (min) (ACUTE ONLY): 28 min  Charges:  $Therapeutic Exercise: 8-22 mins $Therapeutic Activity: 8-22 mins                     3:50 PM, 12/20/20 Lonell Grandchild, MPT Physical Therapist with [facility] [phone] office [phone] mobile phone

## 2020-12-20 NOTE — Progress Notes (Signed)
Inpatient Rehab Admissions Coordinator:   Insurance authorization started. Await decision and bed availability. Will continue to follow.  Gayland Curry, [place], [place] Admissions Coordinator [phone]

## 2020-12-20 NOTE — Progress Notes (Signed)
PROGRESS NOTE     Alicia Guerrero, is a 85 y.o. female, DOB - 1935-03-27, QPY:[id number]  Admit date - 12/16/2020   Admitting Physician Tahmir Kleckner Denton Brick, MD  Outpatient Primary MD for the patient is [name], [name], MD  LOS - 3  Chief Complaint  Patient presents with   Code Stroke   Brief Summary:- 85 y.o. female with medical history significant for  atrial fibrillation with biventricular implantable cardiac defibrillator, ischemic cardiomyopathy, hypertension, hyperlipidemia, chronic systolic heart failure, prior stroke with left-sided weakness and a cardiac arrest in September 2013 admitted on 12/17/2020 with increased weakness on the left side concerning for new stroke superimposed on previous strokes and deficits  Assessment & Plan:   Principal Problem:   Acute ischemic stroke ([place]) Active Problems:   HYPERLIPIDEMIA, MIXED   CAD (coronary artery disease)   Atrial fibrillation ([name])   Ischemic cardiomyopathy   Essential hypertension   Type 2 diabetes mellitus ([name])   Acute CVA (cerebrovascular accident) ([name])   A/p  1)Worsening Left-sided weakness concerning for new acute stroke superimposed on previous stroke-- -- CT head on admission 12/16/20 was unrevealing, -- repeat CT head in the a.m. of 01/04/2021 also unrevealing -CTA head and neck without LVO, significant occlusion of the left M ICA M1 segment noted, along with short segment occlusion of the distal right PCA P2 segment -Unable to do MRI due to AICD in situ -Neurologic consult appreciated recommended repeat CT head after 24 hours -Increase atorvastatin to 80 mg daily for secondary stroke prevention -Continue Eliquis, neurology recommended adding aspirin 81 mg daily -BP goal 120 to 140 mmhg -patient has severe intracranial stenosis so avoid hypotension -LDL is 79 HDL is 42, T cholesterol 126 -Left-sided hemiparesis and speech and swallowing difficulties persist -PT OT and speech therapy eval  appreciated -Recommend CIR--inpatient rehab -Patient and family agreeable -Speech and swallowing difficulties and left upper extremity weakness persist -Still awaiting insurance authorization for transfer to inpatient rehab/CIR  2)Chronic Atrial Fibrillation--- continue Eliquis with secondary stroke prophylaxis, bisoprolol for rate control  3)H/o CAD--ischemic cardiomyopathy, -Chest pain-free, no ACS type symptoms  Aspirin and Lipitor as above, continue bisoprolol  4)DM2--A1c 6.0 reflecting excellent diabetic control PTA -hold metformin Use Novolog/Humalog Sliding scale insulin with Accu-Cheks/Fingersticks as ordered   5)Dysphagia----pathology consult appreciated recommends dysphagia 1 diet with nectar thickened liquids  6)HTN--we initially allowed permissive hypertension for 24 hours then - -BP goal  Now is 120 to 140 mmhg -patient has severe intracranial stenosis so avoid hypotension C/n bisoprolol and Avapro -BP improved with increase of amlodipine to 10 mg daily - may use IV Hydralazine 10 mg  Every 4 hours Prn for systolic blood pressure over 160 mmhg  7) hypokalemia/hypomagnesemia--- replace and recheck  Disposition/Need for in-Hospital Stay- patient unable to be discharged at this time due to --awaiting transfer to CIR--inpatient rehab --Speech, swallowing, and left upper extremity weakness persist --Still awaiting insurance authorization for transfer to inpatient rehab/CIR  Status is: Inpatient  Disposition: The patient is from: Home              Anticipated d/c is to: CIR              Anticipated d/c date is: 1 day              Patient currently is medically stable to d/c. Barriers: Awaiting bed at CIR--inpatient rehab  Code Status :  -  Code Status: Full Code   Family Communication:    (patient is alert, awake and  coherent)  Discussed with Son [name] at bedside  Consults  :  Neuro  DVT Prophylaxis  :   - SCDs   SCD's Start: 12/17/20 0235 apixaban (ELIQUIS)  tablet 5 mg    Lab Results  Component Value Date   PLT 252 12/17/2020    Inpatient Medications  Scheduled Meds:  amLODipine  10 mg Oral Daily   apixaban  5 mg Oral BID   aspirin  81 mg Oral Daily   Or   aspirin  300 mg Rectal Daily   atorvastatin  80 mg Oral Daily   bisoprolol  5 mg Oral Daily   insulin aspart  0-5 Units Subcutaneous QHS   insulin aspart  0-6 Units Subcutaneous TID WC   irbesartan  75 mg Oral Daily   loratadine  10 mg Oral Daily   potassium chloride  40 mEq Oral Q3H   Continuous Infusions: PRN Meds:.acetaminophen **OR** acetaminophen (TYLENOL) oral liquid 160 mg/5 mL **OR** acetaminophen, hydrALAZINE   Anti-infectives (From admission, onward)    None         Subjective: [name] today has no fevers, no emesis,  No chest pain,    -Speech, swallowing, and left upper extremity weakness persist --Still awaiting insurance authorization for transfer to inpatient rehab/CIR  patient's son is helping patient with meals at bedside  Objective: Vitals:   12/20/20 0810 12/20/20 0930 12/20/20 1134 12/20/20 1330  BP: (!) 155/70 (!) 163/65 (!) 150/74 (!) 150/69  Pulse: 67 67 67 67  Resp: 19 18 18 18   Temp: 97.9 F (36.6 C) 98.8 F (37.1 C)  98.7 F (37.1 C)  TempSrc: Oral Oral  Oral  SpO2: 95% 100% 95% 96%  Weight:      Height:        Intake/Output Summary (Last 24 hours) at 12/20/2020 1625 Last data filed at 12/19/2020 1700 Gross per 24 hour  Intake 120 ml  Output 350 ml  Net -230 ml   Filed Weights   12/16/20 2312 12/17/20 0243  Weight: 66.2 kg 62.8 kg    Physical Exam  Gen:- Awake Alert, cooperative HEENT:- [place].AT, No sclera icterus Neck-Supple Neck,No JVD,.  Lungs-  CTAB , fair symmetrical air movement CV- S1, S2 normal, irregular Abd-  +ve B.Sounds, Abd Soft, No tenderness,    Extremity/Skin:- No  edema, pedal pulses present  Psych-affect is appropriate, oriented x3 Neuro- left-sided hemiparesis, mostly left upper extremity,  speech and swallowing difficulties persist, facial asymmetry persist, no tremors  Data Reviewed: I have personally reviewed following labs and imaging studies  CBC: Recent Labs  Lab 12/16/20 2252 12/17/20 0616  WBC 9.7 9.2  NEUTROABS 6.0  --   HGB 12.9 12.4  HCT 39.4 38.2  MCV 85.5 84.3  PLT 270 812   Basic Metabolic Panel: Recent Labs  Lab 12/16/20 2252 12/17/20 0616 12/18/20 1320 12/19/20 0525 12/20/20 0437  NA 141 140 141 141 140  K 2.7* 2.6* 3.0* 3.0* 3.3*  CL 101 103 105 107 108  CO2 29 28 27 25 23   GLUCOSE 112* 105* 98 141* 127*  BUN 13 9 11 13 15   CREATININE 0.69 0.50 0.60 0.62 0.49  CALCIUM 9.1 8.6* 9.4 9.0 9.1  MG  --  1.3* 1.7  --   --   PHOS  --  3.1 3.4  --   --    GFR: Estimated Creatinine Clearance: 44.1 mL/min (by C-G formula based on SCr of 0.49 mg/dL). Liver Function Tests: Recent Labs  Lab 12/16/20  2252 12/17/20 0616 12/18/20 1320  AST 32 30  --   ALT 30 29  --   ALKPHOS 67 64  --   BILITOT 1.0 0.9  --   PROT 6.3* 5.9*  --   ALBUMIN 3.9 3.7 3.8   No results for input(s): LIPASE, AMYLASE in the last 168 hours. No results for input(s): AMMONIA in the last 168 hours. Coagulation Profile: Recent Labs  Lab 12/16/20 2252  INR 1.6*   Cardiac Enzymes: No results for input(s): CKTOTAL, CKMB, CKMBINDEX, TROPONINI in the last 168 hours. BNP (last 3 results) No results for input(s): PROBNP in the last 8760 hours. HbA1C: No results for input(s): HGBA1C in the last 72 hours.  CBG: Recent Labs  Lab 12/19/20 1200 12/19/20 1621 12/19/20 2138 12/20/20 0741 12/20/20 1133  GLUCAP 146* 196* 184* 120* 168*   Lipid Profile: No results for input(s): CHOL, HDL, LDLCALC, TRIG, CHOLHDL, LDLDIRECT in the last 72 hours.  Thyroid Function Tests: No results for input(s): TSH, T4TOTAL, FREET4, T3FREE, THYROIDAB in the last 72 hours. Anemia Panel: No results for input(s): VITAMINB12, FOLATE, FERRITIN, TIBC, IRON, RETICCTPCT in the last 72  hours. Urine analysis:    Component Value Date/Time   COLORURINE YELLOW 12/17/2020 0301   APPEARANCEUR CLEAR 12/17/2020 0301   LABSPEC 1.010 12/17/2020 0301   PHURINE 7.0 12/17/2020 0301   GLUCOSEU NEGATIVE 12/17/2020 0301   GLUCOSEU NEGATIVE 05/19/2015 0843   HGBUR NEGATIVE 12/17/2020 0301   BILIRUBINUR NEGATIVE 12/17/2020 0301   KETONESUR 15 (A) 12/17/2020 0301   PROTEINUR NEGATIVE 12/17/2020 0301   UROBILINOGEN 0.2 05/19/2015 0843   NITRITE NEGATIVE 12/17/2020 0301   LEUKOCYTESUR NEGATIVE 12/17/2020 0301   Sepsis Labs: @LABRCNTIP (procalcitonin:4,lacticidven:4)  ) Recent Results (from the past 240 hour(s))  Resp Panel by RT-PCR (Flu A&B, Covid) Nasopharyngeal Swab     Status: None   Collection Time: 12/16/20 11:48 AM   Specimen: Nasopharyngeal Swab; Nasopharyngeal(NP) swabs in vial transport medium  Result Value Ref Range Status   SARS Coronavirus 2 by RT PCR NEGATIVE NEGATIVE Final    Comment: (NOTE) SARS-CoV-2 target nucleic acids are NOT DETECTED.  The SARS-CoV-2 RNA is generally detectable in upper respiratory specimens during the acute phase of infection. The lowest concentration of SARS-CoV-2 viral copies this assay can detect is 138 copies/mL. A negative result does not preclude SARS-Cov-2 infection and should not be used as the sole basis for treatment or other patient management decisions. A negative result may occur with  improper specimen collection/handling, submission of specimen other than nasopharyngeal swab, presence of viral mutation(s) within the areas targeted by this assay, and inadequate number of viral copies(<138 copies/mL). A negative result must be combined with clinical observations, patient history, and epidemiological information. The expected result is Negative.  Fact Sheet for Patients:  EntrepreneurPulse.com.au  Fact Sheet for Healthcare Providers:  IncredibleEmployment.be  This test is no t yet  approved or cleared by the [place] FDA and  has been authorized for detection and/or diagnosis of SARS-CoV-2 by FDA under an Emergency Use Authorization (EUA). This EUA will remain  in effect (meaning this test can be used) for the duration of the COVID-19 declaration under Section 564(b)(1) of the Act, 21 U.S.C.section 360bbb-3(b)(1), unless the authorization is terminated  or revoked sooner.       Influenza A by PCR NEGATIVE NEGATIVE Final   Influenza B by PCR NEGATIVE NEGATIVE Final    Comment: (NOTE) The Xpert Xpress SARS-CoV-2/FLU/RSV plus assay is intended as an aid in the  diagnosis of influenza from Nasopharyngeal swab specimens and should not be used as a sole basis for treatment. Nasal washings and aspirates are unacceptable for Xpert Xpress SARS-CoV-2/FLU/RSV testing.  Fact Sheet for Patients: EntrepreneurPulse.com.au  Fact Sheet for Healthcare Providers: IncredibleEmployment.be  This test is not yet approved or cleared by the [place] FDA and has been authorized for detection and/or diagnosis of SARS-CoV-2 by FDA under an Emergency Use Authorization (EUA). This EUA will remain in effect (meaning this test can be used) for the duration of the COVID-19 declaration under Section 564(b)(1) of the Act, 21 U.S.C. section 360bbb-3(b)(1), unless the authorization is terminated or revoked.  Performed at [facility], [address], [place], [place] [address]     Radiology Studies: No results found.   Scheduled Meds:  amLODipine  10 mg Oral Daily   apixaban  5 mg Oral BID   aspirin  81 mg Oral Daily   Or   aspirin  300 mg Rectal Daily   atorvastatin  80 mg Oral Daily   bisoprolol  5 mg Oral Daily   insulin aspart  0-5 Units Subcutaneous QHS   insulin aspart  0-6 Units Subcutaneous TID WC   irbesartan  75 mg Oral Daily   loratadine  10 mg Oral Daily   potassium chloride  40 mEq Oral Q3H   Continuous Infusions:    LOS: 3 days    Roxan Hockey M.D on 12/20/2020 at 4:25 PM  Go to [email or web address] - for contact info  [facility] - Office  [phone]  If 7PM-7AM, please contact night-coverage [email or web address] Password [email or web address] 12/20/2020, 4:25 PM

## 2020-12-20 NOTE — TOC Progression Note (Signed)
Transition of Care [facility]) - Progression Note    Patient Details  Name: Anhthu Perdew MRN: [id number] Date of Birth: [birth year]  [facility] of [facility]) CM/SW Contact  Shade Flood, LCSW Phone Number: 12/20/2020, 10:55 AM  Clinical Narrative:     TOC following. Spoke with Lauren in [place] admissions regarding pt. Per Ander Purpura, pt is CIR appropriate and they have started insurance authorization process. Per MD, pt medically stable and can transfer to CIR once auth obtained and bed available.  TOC will follow and assist as needed.  Expected Discharge Plan: IP Rehab Facility Barriers to Discharge: Insurance Authorization  Expected Discharge Plan and Services Expected Discharge Plan: [place] In-house Referral: Clinical Social Work   Post Acute Care Choice: IP Rehab Living arrangements for the past 2 months: Single Family Home                                       Social Determinants of Health (SDOH) Interventions    Readmission Risk Interventions No flowsheet data found.

## 2020-12-20 NOTE — Progress Notes (Signed)
Remote ICD transmission.   

## 2020-12-20 NOTE — Progress Notes (Signed)
Speech Language Pathology Treatment: Dysphagia  Patient Details Name: Alicia Guerrero MRN: [id number] DOB: 1935/02/11 Today's Date: 12/20/2020 Time: 9892-1194 SLP Time Calculation (min) (ACUTE ONLY): 21 min  Assessment / Plan / Recommendation Clinical Impression  Pt seen for ongoing dysphagia intervention following MBSS completed on Friday. Nursing staff reports that Pt's son has been the one to primarily feed the Pt and some coughing was noted. Pt consumed NTL (water, apple) and puree with SLP assist and cues for "small sips" when using the straw. No coughing or throat clearing noted today. Pt states that none of the liquids taste good, but willing to continue efforts to drink more. Continue diet as ordered and SLP will continue to follow.   HPI HPI: 85 y.o. female with medical history significant for  atrial fibrillation with biventricular implantable cardiac defibrillator, ischemic cardiomyopathy, hypertension, hyperlipidemia, chronic systolic heart failure, prior stroke with left-sided weakness and a cardiac arrest in September 2013 who presents to the emergency department due to increased left-sided weakness (from baseline) which started yesterday in the afternoon.  Patient was unable to provide history due to slurred speech, history was obtained from ED physician, ED medical record and son at bedside.  Per report, patient noted increased leg weakness around 4:30 PM when she tried to get up and she also noted increased left upper extremity from baseline.  Slurred speech was noted.  EMS was activated and patient was taken to the ED for further evaluation and management.  She denies chest pain, shortness of breath, headache, vision changes; CT head indicated on 12/16/20 negative; MRI pending ability to perform d/t defibrillator; SLE/BSE generated      SLP Plan  Continue with current plan of care      Recommendations for follow up therapy are one component of a multi-disciplinary discharge  planning process, led by the attending physician.  Recommendations may be updated based on patient status, additional functional criteria and insurance authorization.    Recommendations  Liquids provided via: Cup;Straw (small sips) Medication Administration: Crushed with puree Supervision: Full supervision/cueing for compensatory strategies Compensations: Slow rate;Small sips/bites;Minimize environmental distractions;Lingual sweep for clearance of pocketing;Multiple dry swallows after each bite/sip;Effortful swallow Postural Changes and/or Swallow Maneuvers: Seated upright 90 degrees;Upright 30-60 min after meal                Oral Care Recommendations: Oral care BID Follow Up Recommendations: Acute inpatient rehab (3hours/day) Assistance recommended at discharge: Frequent or constant Supervision/Assistance SLP Visit Diagnosis: Dysphagia, oropharyngeal phase (R13.12);Dysarthria and anarthria (R47.1);Cognitive communication deficit (R41.841);Attention and concentration deficit Attention and concentration deficit following: Other cerebrovascular disease Plan: Continue with current plan of care       Thank you,  Genene Churn, [place]                [place]  12/20/2020, 4:34 PM

## 2020-12-21 LAB — GLUCOSE, CAPILLARY
Glucose-Capillary: 106 mg/dL — ABNORMAL HIGH (ref 70–99)
Glucose-Capillary: 176 mg/dL — ABNORMAL HIGH (ref 70–99)
Glucose-Capillary: 193 mg/dL — ABNORMAL HIGH (ref 70–99)
Glucose-Capillary: 148 mg/dL — ABNORMAL HIGH (ref 70–99)

## 2020-12-21 LAB — BASIC METABOLIC PANEL
Anion gap: 7 (ref 5–15)
BUN: 18 mg/dL (ref 8–23)
CO2: 24 mmol/L (ref 22–32)
Calcium: 9.4 mg/dL (ref 8.9–10.3)
Chloride: 110 mmol/L (ref 98–111)
Creatinine, Ser: 0.51 mg/dL (ref 0.44–1.00)
GFR, Estimated: >60 mL/min (ref >60)
Glucose, Bld: 131 mg/dL — ABNORMAL HIGH (ref 70–99)
Potassium: 4.2 mmol/L (ref 3.5–5.1)
Sodium: 141 mmol/L (ref 135–145)

## 2020-12-21 NOTE — Progress Notes (Signed)
Inpatient Rehab Admissions Coordinator:   I do not have a bed for this Pt. Today on CIR but will follow for potential admit pending insurance auth and bed availability.   Clemens Catholic, [place], [place] Admissions Coordinator  [phone] ([place]) [phone] (office)

## 2020-12-21 NOTE — Progress Notes (Addendum)
Physical Therapy Treatment Patient Details Name: Alicia Guerrero MRN: [id number] DOB: 1935/05/31 Today's Date: 12/21/2020   History of Present Illness Alicia Guerrero is an 85 y.o. female who presents to the emergency department due to increased left-sided weakness (from baseline). Head CT unrevealing, unable to do MRI due to AICD in situ. PMH: afib with biventricular implantable cardiac defibrillator, ischemic cardiomyopathy, hypertension, hyperlipidemia, chronic systolic heart failure, prior stroke with left-sided weakness and a cardiac arrest in September 2013    PT Comments    Pt requires min A to roll into R sidelying due to LUE weakness unable to reach across body to assist with rotation. Mutimodal cues to bring trunk into upright sitting, mod A with use of bedpad to upright trunk and scoot out to EOB, pt able to mobilize BLE over to EOB with increased time. Once seated EOB, pt with poor sitting balance, LOB to R despite propping on RUE; assist back to normal postural alignment frequently. Pt powers to stand with therapist anterior to pt, pushing up through BLE, bil knees blocked and mutimodal cues for upright posture. Pt able to reach for recliner with RUE and slide L foot over towards chair with mod A +2 for safety while pivoting. Pt actively participating with RUE and BLE, LUE supported by therapist or LPN with mobility, very motivated to mobilize and get OOB. Pt tolerates remaining up in chair at EOS, able to push with RUE to reposition bottom in chair to comfort. Will continue to progress as able.    Recommendations for follow up therapy are one component of a multi-disciplinary discharge planning process, led by the attending physician.  Recommendations may be updated based on patient status, additional functional criteria and insurance authorization.  Follow Up Recommendations  Acute inpatient rehab (3hours/day)     Assistance Recommended at Discharge Intermittent  Supervision/Assistance  Equipment Recommendations  None recommended by PT    Recommendations for Other Services       Precautions / Restrictions Precautions Precautions: Fall Precaution Comments: LUE hemiplegia Restrictions Weight Bearing Restrictions: No     Mobility  Bed Mobility Overal bed mobility: Needs Assistance Bed Mobility: Rolling;Supine to Sit Rolling: Min assist;Supervision  Supine to sit: Mod assist  General bed mobility comments: supv to roll supine to L sidelying, able to place RLE into hooklying and pull with BUE on bedrail; min A to roll into R sidelying, able to place LLE into hooklying and requires assist to rotate trunk to R due to LUE weakness; heavy VC for hand placement and sequencing with sitting up at EOB, mod A for trunk uprighting and scooting out to EOB with use of bedpad    Transfers Overall transfer level: Needs assistance Equipment used: 1 person hand held assist Transfers: Sit to/from Stand;Bed to chair/wheelchair/BSC Sit to Stand: Mod assist  Step pivot transfers: Mod assist;+2 safety/equipment  General transfer comment: therapist anterior to pt in "bear hug" position, blocking BLE knees, slow to power to stand with VC for sequencing, mod A +2 for safety with pivot over to recliner, able to reach with RUE for recliner, significantly increased time, VC for upright posture due to flexed trunk preferred    Ambulation/Gait  General Gait Details: able to slide LLE over to recliner, weakness and blocking of BLE and multimodal cues for upright posture due to weakness and fatigue   Stairs             Wheelchair Mobility    Modified Rankin (Stroke Patients Only)  Balance Overall balance assessment: Needs assistance Sitting-balance support: Feet supported;Bilateral upper extremity supported Sitting balance-Leahy Scale: Poor Sitting balance - Comments: mod A to maintain static sitting EOB with propping on RUE, LOB R requiring assist  to return to upright posture Postural control: Right lateral lean Standing balance support: During functional activity;Bilateral upper extremity supported;Reliant on assistive device for balance Standing balance-Leahy Scale: Poor     Cognition Arousal/Alertness: Awake/alert Behavior During Therapy: WFL for tasks assessed/performed Overall Cognitive Status: Within Functional Limits for tasks assessed  General Comments: Pt alert and oriented, conversational with some slurred speech        Exercises      General Comments        Pertinent Vitals/Pain Pain Assessment: No/denies pain    Home Living                          Prior Function            PT Goals (current goals can now be found in the care plan section) Acute Rehab PT Goals Patient Stated Goal: Return home after rehab PT Goal Formulation: With patient/family Time For Goal Achievement: 12/31/20 Potential to Achieve Goals: Good Progress towards PT goals: Progressing toward goals    Frequency    Min 4X/week      PT Plan Current plan remains appropriate    Co-evaluation              AM-PAC PT "6 Clicks" Mobility   Outcome Measure  Help needed turning from your back to your side while in a flat bed without using bedrails?: A Little Help needed moving from lying on your back to sitting on the side of a flat bed without using bedrails?: A Lot Help needed moving to and from a bed to a chair (including a wheelchair)?: A Lot Help needed standing up from a chair using your arms (e.g., wheelchair or bedside chair)?: A Lot Help needed to walk in hospital room?: Total Help needed climbing 3-5 steps with a railing? : Total 6 Click Score: 11    End of Session Equipment Utilized During Treatment: Gait belt Activity Tolerance: Patient tolerated treatment well;Patient limited by fatigue Patient left: in chair;with call bell/phone within reach Nurse Communication: Mobility status PT Visit Diagnosis:  Unsteadiness on feet (R26.81);Other abnormalities of gait and mobility (R26.89);Muscle weakness (generalized) (M62.81);Other symptoms and signs involving the nervous system (Y63.785)     Time: 8850-2774 PT Time Calculation (min) (ACUTE ONLY): 36 min  Charges:  $Therapeutic Activity: 23-37 mins                      Tori Willisha Sligar PT, DPT 12/21/20, 1:04 PM

## 2020-12-21 NOTE — Progress Notes (Signed)
Inpatient Rehab Admissions Coordinator:   I received insurance auth for CIR from [facility] and now await a bed.  Clemens Catholic, [place], [place] Admissions Coordinator  [phone] ([place]) [phone] (office)

## 2020-12-21 NOTE — Progress Notes (Signed)
PROGRESS NOTE     Alicia Guerrero, is a 85 y.o. female, DOB - 1935-10-04, GYI:[id number]  Admit date - 12/16/2020   Admitting Physician Laaibah Wartman Denton Brick, MD  Outpatient Primary MD for the patient is [name], Alicia Lacks, MD  LOS - 4  Chief Complaint  Patient presents with   Code Stroke   Brief Summary:- 85 y.o. female with medical history significant for  atrial fibrillation with biventricular implantable cardiac defibrillator, ischemic cardiomyopathy, hypertension, hyperlipidemia, chronic systolic heart failure, prior stroke with left-sided weakness and a cardiac arrest in September 2013 admitted on 12/17/2020 with increased weakness on the left side concerning for new stroke superimposed on previous strokes and deficits  Assessment & Plan:   Principal Problem:   Acute ischemic stroke ([place]) Active Problems:   HYPERLIPIDEMIA, MIXED   CAD (coronary artery disease)   Atrial fibrillation ([place])   Ischemic cardiomyopathy   Essential hypertension   Type 2 diabetes mellitus ([place])   Acute CVA (cerebrovascular accident) ([place])   A/p  1)Worsening Left-sided weakness concerning for new acute stroke superimposed on previous stroke-- -- CT head on admission 12/16/20 was unrevealing, -- repeat CT head in the a.m. of 01/04/2021 also unrevealing -CTA head and neck without LVO, significant occlusion of the left M ICA M1 segment noted, along with short segment occlusion of the distal right PCA P2 segment -Unable to do MRI due to AICD in situ -Neurologic consult appreciated recommended repeat CT head after 24 hours -Increase atorvastatin to 80 mg daily for secondary stroke prevention -Continue Eliquis, neurology recommended adding aspirin 81 mg daily -BP goal 120 to 140 mmhg -patient has severe intracranial stenosis so avoid hypotension -LDL is 79 HDL is 42, T cholesterol 126 -Left-sided hemiparesis and speech and swallowing difficulties persist -PT OT and speech therapy eval  appreciated -Recommend CIR--inpatient rehab -Patient and family agreeable -Speech and swallowing difficulties and left upper extremity weakness persist -Still awaiting transfer to inpatient rehab/CIR at [facility]  -Possible discharge 12/22/2020  2)Chronic Atrial Fibrillation--- continue Eliquis with secondary stroke prophylaxis, bisoprolol for rate control  3)H/o CAD--ischemic cardiomyopathy, -Chest pain-free, no ACS type symptoms  Aspirin and Lipitor as above, continue bisoprolol  4)DM2--A1c 6.0 reflecting excellent diabetic control PTA -hold metformin Use Novolog/Humalog Sliding scale insulin with Accu-Cheks/Fingersticks as ordered   5)Dysphagia----pathology consult appreciated recommends dysphagia 1 diet with nectar thickened liquids  6)HTN--we initially allowed permissive hypertension for 24 hours then - -BP goal  Now is 120 to 140 mmhg -patient has severe intracranial stenosis so avoid hypotension C/n bisoprolol and Avapro -BP improved with increase of amlodipine to 10 mg daily - may use IV Hydralazine 10 mg  Every 4 hours Prn for systolic blood pressure over 160 mmhg  7) hypokalemia/hypomagnesemia--- replace and recheck  Disposition/Need for in-Hospital Stay- patient unable to be discharged at this time due to --awaiting transfer to CIR--inpatient rehab --Speech, swallowing, and left upper extremity weakness persist -Still awaiting transfer to inpatient rehab/CIR at [facility]  -Possible discharge 12/22/2020  Status is: Inpatient  Disposition: The patient is from: Home              Anticipated d/c is to: CIR              Anticipated d/c date is: 1 day              Patient currently is medically stable to d/c. Barriers: Awaiting bed at CIR--inpatient rehab  Code Status :  -  Code Status: Full Code   Family Communication:    (  patient is alert, awake and coherent)  Discussed with Son Alicia Guerrero at bedside  Consults  :  Neuro  DVT Prophylaxis  :   - SCDs   SCD's Start:  12/17/20 0235 apixaban (ELIQUIS) tablet 5 mg    Lab Results  Component Value Date   PLT 252 12/17/2020    Inpatient Medications  Scheduled Meds:  amLODipine  10 mg Oral Daily   apixaban  5 mg Oral BID   aspirin  81 mg Oral Daily   atorvastatin  80 mg Oral Daily   bisoprolol  5 mg Oral Daily   insulin aspart  0-5 Units Subcutaneous QHS   insulin aspart  0-6 Units Subcutaneous TID WC   irbesartan  75 mg Oral Daily   loratadine  10 mg Oral Daily   Continuous Infusions: PRN Meds:.acetaminophen **OR** acetaminophen (TYLENOL) oral liquid 160 mg/5 mL **OR** acetaminophen, hydrALAZINE   Anti-infectives (From admission, onward)    None         Subjective: [name] today has no fevers, no emesis,  No chest pain,    -Speech, swallowing, and left upper extremity weakness persist -Tolerating oral intake better   -Still awaiting transfer to inpatient rehab/CIR at [facility]  -Possible discharge 12/22/2020  Objective: Vitals:   12/20/20 1730 12/20/20 2058 12/21/20 0533 12/21/20 1300  BP: (!) 159/71 (!) 157/71 (!) 165/76 (!) 168/85  Pulse: 66 66 67 67  Resp: 20 20 20 20   Temp: 98.5 F (36.9 C) 98.2 F (36.8 C) (!) 97.5 F (36.4 C) 98.2 F (36.8 C)  TempSrc: Oral  Oral Oral  SpO2:  95% 95% 97%  Weight:      Height:        Intake/Output Summary (Last 24 hours) at 12/21/2020 2038 Last data filed at 12/21/2020 1700 Gross per 24 hour  Intake 275 ml  Output 500 ml  Net -225 ml   Filed Weights   12/16/20 2312 12/17/20 0243  Weight: 66.2 kg 62.8 kg    Physical Exam  Gen:- Awake Alert, cooperative HEENT:- [place].AT, No sclera icterus Neck-Supple Neck,No JVD,.  Lungs-  CTAB , fair symmetrical air movement CV- S1, S2 normal, irregular Abd-  +ve B.Sounds, Abd Soft, No tenderness,    Extremity/Skin:- No  edema, pedal pulses present  Psych-affect is appropriate, oriented x3 Neuro- left-sided hemiparesis, mostly left upper extremity, speech and swallowing difficulties  persist, facial asymmetry persist, no tremors  Data Reviewed: I have personally reviewed following labs and imaging studies  CBC: Recent Labs  Lab 12/16/20 2252 12/17/20 0616  WBC 9.7 9.2  NEUTROABS 6.0  --   HGB 12.9 12.4  HCT 39.4 38.2  MCV 85.5 84.3  PLT 270 938   Basic Metabolic Panel: Recent Labs  Lab 12/17/20 0616 12/18/20 1320 12/19/20 0525 12/20/20 0437 12/21/20 0617  NA 140 141 141 140 141  K 2.6* 3.0* 3.0* 3.3* 4.2  CL 103 105 107 108 110  CO2 28 27 25 23 24   GLUCOSE 105* 98 141* 127* 131*  BUN 9 11 13 15 18   CREATININE 0.50 0.60 0.62 0.49 0.51  CALCIUM 8.6* 9.4 9.0 9.1 9.4  MG 1.3* 1.7  --   --   --   PHOS 3.1 3.4  --   --   --    GFR: Estimated Creatinine Clearance: 44.1 mL/min (by C-G formula based on SCr of 0.51 mg/dL). Liver Function Tests: Recent Labs  Lab 12/16/20 2252 12/17/20 0616 12/18/20 1320  AST 32 30  --  ALT 30 29  --   ALKPHOS 67 64  --   BILITOT 1.0 0.9  --   PROT 6.3* 5.9*  --   ALBUMIN 3.9 3.7 3.8   No results for input(s): LIPASE, AMYLASE in the last 168 hours. No results for input(s): AMMONIA in the last 168 hours. Coagulation Profile: Recent Labs  Lab 12/16/20 2252  INR 1.6*   Cardiac Enzymes: No results for input(s): CKTOTAL, CKMB, CKMBINDEX, TROPONINI in the last 168 hours. BNP (last 3 results) No results for input(s): PROBNP in the last 8760 hours. HbA1C: No results for input(s): HGBA1C in the last 72 hours.  CBG: Recent Labs  Lab 12/20/20 1659 12/20/20 2101 12/21/20 0724 12/21/20 1150 12/21/20 1617  GLUCAP 192* 153* 106* 176* 193*   Lipid Profile: No results for input(s): CHOL, HDL, LDLCALC, TRIG, CHOLHDL, LDLDIRECT in the last 72 hours.  Thyroid Function Tests: No results for input(s): TSH, T4TOTAL, FREET4, T3FREE, THYROIDAB in the last 72 hours. Anemia Panel: No results for input(s): VITAMINB12, FOLATE, FERRITIN, TIBC, IRON, RETICCTPCT in the last 72 hours. Urine analysis:    Component Value  Date/Time   COLORURINE YELLOW 12/17/2020 0301   APPEARANCEUR CLEAR 12/17/2020 0301   LABSPEC 1.010 12/17/2020 0301   PHURINE 7.0 12/17/2020 0301   GLUCOSEU NEGATIVE 12/17/2020 0301   GLUCOSEU NEGATIVE 05/19/2015 0843   HGBUR NEGATIVE 12/17/2020 0301   BILIRUBINUR NEGATIVE 12/17/2020 0301   KETONESUR 15 (A) 12/17/2020 0301   PROTEINUR NEGATIVE 12/17/2020 0301   UROBILINOGEN 0.2 05/19/2015 0843   NITRITE NEGATIVE 12/17/2020 0301   LEUKOCYTESUR NEGATIVE 12/17/2020 0301   Sepsis Labs: @LABRCNTIP (procalcitonin:4,lacticidven:4)  ) Recent Results (from the past 240 hour(s))  Resp Panel by RT-PCR (Flu A&B, Covid) Nasopharyngeal Swab     Status: None   Collection Time: 12/16/20 11:48 AM   Specimen: Nasopharyngeal Swab; Nasopharyngeal(NP) swabs in vial transport medium  Result Value Ref Range Status   SARS Coronavirus 2 by RT PCR NEGATIVE NEGATIVE Final    Comment: (NOTE) SARS-CoV-2 target nucleic acids are NOT DETECTED.  The SARS-CoV-2 RNA is generally detectable in upper respiratory specimens during the acute phase of infection. The lowest concentration of SARS-CoV-2 viral copies this assay can detect is 138 copies/mL. A negative result does not preclude SARS-Cov-2 infection and should not be used as the sole basis for treatment or other patient management decisions. A negative result may occur with  improper specimen collection/handling, submission of specimen other than nasopharyngeal swab, presence of viral mutation(s) within the areas targeted by this assay, and inadequate number of viral copies(<138 copies/mL). A negative result must be combined with clinical observations, patient history, and epidemiological information. The expected result is Negative.  Fact Sheet for Patients:  EntrepreneurPulse.com.au  Fact Sheet for Healthcare Providers:  IncredibleEmployment.be  This test is no t yet approved or cleared by the [place] FDA and   has been authorized for detection and/or diagnosis of SARS-CoV-2 by FDA under an Emergency Use Authorization (EUA). This EUA will remain  in effect (meaning this test can be used) for the duration of the COVID-19 declaration under Section 564(b)(1) of the Act, 21 U.S.C.section 360bbb-3(b)(1), unless the authorization is terminated  or revoked sooner.       Influenza A by PCR NEGATIVE NEGATIVE Final   Influenza B by PCR NEGATIVE NEGATIVE Final    Comment: (NOTE) The Xpert Xpress SARS-CoV-2/FLU/RSV plus assay is intended as an aid in the diagnosis of influenza from Nasopharyngeal swab specimens and should not be used as  a sole basis for treatment. Nasal washings and aspirates are unacceptable for Xpert Xpress SARS-CoV-2/FLU/RSV testing.  Fact Sheet for Patients: EntrepreneurPulse.com.au  Fact Sheet for Healthcare Providers: IncredibleEmployment.be  This test is not yet approved or cleared by the [place] FDA and has been authorized for detection and/or diagnosis of SARS-CoV-2 by FDA under an Emergency Use Authorization (EUA). This EUA will remain in effect (meaning this test can be used) for the duration of the COVID-19 declaration under Section 564(b)(1) of the Act, 21 U.S.C. section 360bbb-3(b)(1), unless the authorization is terminated or revoked.  Performed at [facility], [address] [place], [place] [address]     Radiology Studies: No results found.   Scheduled Meds:  amLODipine  10 mg Oral Daily   apixaban  5 mg Oral BID   aspirin  81 mg Oral Daily   atorvastatin  80 mg Oral Daily   bisoprolol  5 mg Oral Daily   insulin aspart  0-5 Units Subcutaneous QHS   insulin aspart  0-6 Units Subcutaneous TID WC   irbesartan  75 mg Oral Daily   loratadine  10 mg Oral Daily   Continuous Infusions:   LOS: 4 days    Roxan Hockey M.D on 12/21/2020 at 8:38 PM  Go to [email or web address] - for contact info  [facility] -  Office  [phone]  If 7PM-7AM, please contact night-coverage [email or web address] Password [facility] 12/21/2020, 8:38 PM

## 2020-12-22 ENCOUNTER — Encounter (HOSPITAL_COMMUNITY): Payer: Self-pay | Admitting: Physical Medicine & Rehabilitation

## 2020-12-22 ENCOUNTER — Inpatient Hospital Stay (HOSPITAL_COMMUNITY)
Admission: RE | Admit: 2020-12-22 | Discharge: 2021-01-19 | DRG: 057 | Disposition: A | Discharge status: Home Health | Payer: Medicare HMO | Source: Other Acute Inpatient Hospital | Attending: Physical Medicine & Rehabilitation | Admitting: Physical Medicine & Rehabilitation

## 2020-12-22 ENCOUNTER — Other Ambulatory Visit: Payer: Self-pay

## 2020-12-22 DIAGNOSIS — E785 Hyperlipidemia, unspecified: Secondary | ICD-10-CM | POA: Diagnosis present

## 2020-12-22 DIAGNOSIS — E1121 Type 2 diabetes mellitus with diabetic nephropathy: Secondary | ICD-10-CM | POA: Diagnosis not present

## 2020-12-22 DIAGNOSIS — Z79899 Other long term (current) drug therapy: Secondary | ICD-10-CM

## 2020-12-22 DIAGNOSIS — G4700 Insomnia, unspecified: Secondary | ICD-10-CM | POA: Diagnosis present

## 2020-12-22 DIAGNOSIS — N189 Chronic kidney disease, unspecified: Secondary | ICD-10-CM | POA: Diagnosis present

## 2020-12-22 DIAGNOSIS — Z90710 Acquired absence of both cervix and uterus: Secondary | ICD-10-CM

## 2020-12-22 DIAGNOSIS — I2510 Atherosclerotic heart disease of native coronary artery without angina pectoris: Secondary | ICD-10-CM | POA: Diagnosis present

## 2020-12-22 DIAGNOSIS — L8990 Pressure ulcer of unspecified site, unspecified stage: Secondary | ICD-10-CM | POA: Insufficient documentation

## 2020-12-22 DIAGNOSIS — I517 Cardiomegaly: Secondary | ICD-10-CM | POA: Diagnosis not present

## 2020-12-22 DIAGNOSIS — E119 Type 2 diabetes mellitus without complications: Secondary | ICD-10-CM | POA: Diagnosis not present

## 2020-12-22 DIAGNOSIS — I482 Chronic atrial fibrillation, unspecified: Secondary | ICD-10-CM | POA: Diagnosis not present

## 2020-12-22 DIAGNOSIS — Z8249 Family history of ischemic heart disease and other diseases of the circulatory system: Secondary | ICD-10-CM | POA: Diagnosis not present

## 2020-12-22 DIAGNOSIS — I255 Ischemic cardiomyopathy: Secondary | ICD-10-CM | POA: Diagnosis present

## 2020-12-22 DIAGNOSIS — R1312 Dysphagia, oropharyngeal phase: Secondary | ICD-10-CM | POA: Diagnosis present

## 2020-12-22 DIAGNOSIS — R059 Cough, unspecified: Secondary | ICD-10-CM | POA: Diagnosis not present

## 2020-12-22 DIAGNOSIS — I63431 Cerebral infarction due to embolism of right posterior cerebral artery: Secondary | ICD-10-CM | POA: Diagnosis not present

## 2020-12-22 DIAGNOSIS — Z8049 Family history of malignant neoplasm of other genital organs: Secondary | ICD-10-CM | POA: Diagnosis not present

## 2020-12-22 DIAGNOSIS — Z955 Presence of coronary angioplasty implant and graft: Secondary | ICD-10-CM | POA: Diagnosis not present

## 2020-12-22 DIAGNOSIS — I10 Essential (primary) hypertension: Secondary | ICD-10-CM | POA: Diagnosis not present

## 2020-12-22 DIAGNOSIS — K5909 Other constipation: Secondary | ICD-10-CM | POA: Diagnosis not present

## 2020-12-22 DIAGNOSIS — I130 Hypertensive heart and chronic kidney disease with heart failure and stage 1 through stage 4 chronic kidney disease, or unspecified chronic kidney disease: Secondary | ICD-10-CM | POA: Diagnosis present

## 2020-12-22 DIAGNOSIS — L89151 Pressure ulcer of sacral region, stage 1: Secondary | ICD-10-CM | POA: Diagnosis not present

## 2020-12-22 DIAGNOSIS — Z95810 Presence of automatic (implantable) cardiac defibrillator: Secondary | ICD-10-CM

## 2020-12-22 DIAGNOSIS — E782 Mixed hyperlipidemia: Secondary | ICD-10-CM | POA: Diagnosis not present

## 2020-12-22 DIAGNOSIS — E1122 Type 2 diabetes mellitus with diabetic chronic kidney disease: Secondary | ICD-10-CM | POA: Diagnosis not present

## 2020-12-22 DIAGNOSIS — I5022 Chronic systolic (congestive) heart failure: Secondary | ICD-10-CM | POA: Diagnosis not present

## 2020-12-22 DIAGNOSIS — R1310 Dysphagia, unspecified: Secondary | ICD-10-CM

## 2020-12-22 DIAGNOSIS — Z853 Personal history of malignant neoplasm of breast: Secondary | ICD-10-CM | POA: Diagnosis not present

## 2020-12-22 DIAGNOSIS — I69354 Hemiplegia and hemiparesis following cerebral infarction affecting left non-dominant side: Principal | ICD-10-CM

## 2020-12-22 DIAGNOSIS — I63511 Cerebral infarction due to unspecified occlusion or stenosis of right middle cerebral artery: Secondary | ICD-10-CM | POA: Diagnosis not present

## 2020-12-22 DIAGNOSIS — I69391 Dysphagia following cerebral infarction: Secondary | ICD-10-CM | POA: Diagnosis not present

## 2020-12-22 DIAGNOSIS — I4891 Unspecified atrial fibrillation: Secondary | ICD-10-CM | POA: Diagnosis present

## 2020-12-22 DIAGNOSIS — I639 Cerebral infarction, unspecified: Secondary | ICD-10-CM

## 2020-12-22 DIAGNOSIS — I252 Old myocardial infarction: Secondary | ICD-10-CM

## 2020-12-22 DIAGNOSIS — I6300 Cerebral infarction due to thrombosis of unspecified precerebral artery: Secondary | ICD-10-CM | POA: Diagnosis not present

## 2020-12-22 DIAGNOSIS — R410 Disorientation, unspecified: Secondary | ICD-10-CM | POA: Diagnosis not present

## 2020-12-22 DIAGNOSIS — Z7901 Long term (current) use of anticoagulants: Secondary | ICD-10-CM

## 2020-12-22 DIAGNOSIS — I6330 Cerebral infarction due to thrombosis of unspecified cerebral artery: Secondary | ICD-10-CM | POA: Diagnosis not present

## 2020-12-22 DIAGNOSIS — T17908A Unspecified foreign body in respiratory tract, part unspecified causing other injury, initial encounter: Secondary | ICD-10-CM

## 2020-12-22 DIAGNOSIS — G450 Vertebro-basilar artery syndrome: Secondary | ICD-10-CM | POA: Diagnosis not present

## 2020-12-22 LAB — GLUCOSE, CAPILLARY
Glucose-Capillary: 117 mg/dL — ABNORMAL HIGH (ref 70–99)
Glucose-Capillary: 176 mg/dL — ABNORMAL HIGH (ref 70–99)
Glucose-Capillary: 134 mg/dL — ABNORMAL HIGH (ref 70–99)
Glucose-Capillary: 154 mg/dL — ABNORMAL HIGH (ref 70–99)

## 2020-12-22 MED ORDER — ALUM & MAG HYDROXIDE-SIMETH 200-200-20 MG/5ML PO SUSP: 30.0000 mL | ORAL | Status: DC | PRN

## 2020-12-22 MED ORDER — IRBESARTAN 75 MG PO TABS: 75.0000 mg | ORAL_TABLET | Freq: Every day | ORAL | 3 refills | Status: DC

## 2020-12-22 MED ORDER — VITAMIN B-12 1000 MCG PO TABS
1000.0000 ug | ORAL_TABLET | Freq: Every day | ORAL | Status: DC
  Administered 2020-12-22 – 2021-01-19 (×29): 1000 ug via ORAL
  Filled 2020-12-22 (×29): qty 1

## 2020-12-22 MED ORDER — CYANOCOBALAMIN 1000 MCG SL SUBL: 1.0000 | SUBLINGUAL_TABLET | Freq: Every day | SUBLINGUAL | Status: DC

## 2020-12-22 MED ORDER — ATORVASTATIN CALCIUM 80 MG PO TABS
80.0000 mg | ORAL_TABLET | Freq: Every day | ORAL | Status: DC
  Administered 2020-12-23 – 2021-01-18 (×27): 80 mg via ORAL
  Filled 2020-12-22 (×27): qty 1

## 2020-12-22 MED ORDER — BISOPROLOL FUMARATE 5 MG PO TABS
5.0000 mg | ORAL_TABLET | Freq: Every day | ORAL | Status: DC
  Administered 2020-12-23 – 2021-01-19 (×28): 5 mg via ORAL
  Filled 2020-12-22 (×28): qty 1

## 2020-12-22 MED ORDER — INSULIN ASPART 100 UNIT/ML IJ SOLN
0.0000 [IU] | Freq: Every day | INTRAMUSCULAR | Status: DC
  Administered 2020-12-27 – 2021-01-10 (×2): 2 [IU] via SUBCUTANEOUS

## 2020-12-22 MED ORDER — ASPIRIN 81 MG PO CHEW
81.0000 mg | CHEWABLE_TABLET | Freq: Every day | ORAL | 3 refills | Status: AC
Start: 2020-12-23 — End: ?

## 2020-12-22 MED ORDER — APIXABAN 5 MG PO TABS
5.0000 mg | ORAL_TABLET | Freq: Two times a day (BID) | ORAL | Status: DC
  Administered 2020-12-22 – 2021-01-19 (×56): 5 mg via ORAL
  Filled 2020-12-22 (×56): qty 1

## 2020-12-22 MED ORDER — POLYETHYLENE GLYCOL 3350 17 G PO PACK: 17.0000 g | PACK | Freq: Every day | ORAL | Status: DC | PRN

## 2020-12-22 MED ORDER — PROCHLORPERAZINE EDISYLATE 10 MG/2ML IJ SOLN: 5.0000 mg | Freq: Four times a day (QID) | INTRAMUSCULAR | Status: DC | PRN

## 2020-12-22 MED ORDER — LORATADINE 10 MG PO TABS: 10.0000 mg | ORAL_TABLET | Freq: Every day | ORAL | Status: DC

## 2020-12-22 MED ORDER — AMLODIPINE BESYLATE 10 MG PO TABS: 10.0000 mg | ORAL_TABLET | Freq: Every day | ORAL | 3 refills | Status: DC

## 2020-12-22 MED ORDER — FLEET ENEMA 7-19 GM/118ML RE ENEM: 1.0000 | ENEMA | Freq: Once | RECTAL | Status: DC | PRN

## 2020-12-22 MED ORDER — ATORVASTATIN CALCIUM 40 MG PO TABS: 80.0000 mg | ORAL_TABLET | Freq: Every day | ORAL | Status: DC

## 2020-12-22 MED ORDER — PROCHLORPERAZINE MALEATE 5 MG PO TABS
5.0000 mg | ORAL_TABLET | Freq: Four times a day (QID) | ORAL | Status: DC | PRN
Start: 2020-12-22 — End: 2021-01-19

## 2020-12-22 MED ORDER — NITROGLYCERIN 0.4 MG SL SUBL: 0.4000 mg | SUBLINGUAL_TABLET | SUBLINGUAL | Status: DC | PRN

## 2020-12-22 MED ORDER — ASPIRIN 81 MG PO CHEW
81.0000 mg | CHEWABLE_TABLET | Freq: Every day | ORAL | Status: DC
  Administered 2020-12-23 – 2021-01-19 (×28): 81 mg via ORAL
  Filled 2020-12-22 (×28): qty 1

## 2020-12-22 MED ORDER — PROCHLORPERAZINE 25 MG RE SUPP: 12.5000 mg | Freq: Four times a day (QID) | RECTAL | Status: DC | PRN

## 2020-12-22 MED ORDER — TRAZODONE HCL 50 MG PO TABS
25.0000 mg | ORAL_TABLET | Freq: Every evening | ORAL | Status: DC | PRN
  Administered 2020-12-23 – 2021-01-10 (×8): 50 mg via ORAL
  Administered 2021-01-12: 25 mg via ORAL
  Administered 2021-01-13: 50 mg via ORAL
  Filled 2020-12-22 (×11): qty 1

## 2020-12-22 MED ORDER — LORATADINE 10 MG PO TABS
10.0000 mg | ORAL_TABLET | Freq: Every day | ORAL | Status: DC
  Administered 2020-12-22 – 2021-01-19 (×29): 10 mg via ORAL
  Filled 2020-12-22 (×29): qty 1

## 2020-12-22 MED ORDER — AMLODIPINE BESYLATE 10 MG PO TABS
10.0000 mg | ORAL_TABLET | Freq: Every day | ORAL | Status: DC
  Administered 2020-12-23: 10 mg via ORAL
  Filled 2020-12-22: qty 1

## 2020-12-22 MED ORDER — BISACODYL 10 MG RE SUPP: 10.0000 mg | Freq: Every day | RECTAL | Status: DC | PRN

## 2020-12-22 MED ORDER — BLOOD PRESSURE CONTROL BOOK
Freq: Once | Status: AC
  Filled 2020-12-22: qty 1

## 2020-12-22 MED ORDER — ACETAMINOPHEN 325 MG PO TABS
325.0000 mg | ORAL_TABLET | ORAL | Status: DC | PRN
  Administered 2021-01-08 – 2021-01-12 (×2): 650 mg via ORAL
  Filled 2020-12-22 (×2): qty 2

## 2020-12-22 MED ORDER — DIPHENHYDRAMINE HCL 12.5 MG/5ML PO ELIX: 12.5000 mg | ORAL_SOLUTION | Freq: Four times a day (QID) | ORAL | Status: DC | PRN

## 2020-12-22 MED ORDER — INSULIN ASPART 100 UNIT/ML IJ SOLN
0.0000 [IU] | Freq: Three times a day (TID) | INTRAMUSCULAR | Status: DC
  Administered 2020-12-23 – 2021-01-18 (×27): 1 [IU] via SUBCUTANEOUS

## 2020-12-22 MED ORDER — GUAIFENESIN-DM 100-10 MG/5ML PO SYRP: 5.0000 mL | ORAL_SOLUTION | Freq: Four times a day (QID) | ORAL | Status: DC | PRN

## 2020-12-22 MED ORDER — IRBESARTAN 75 MG PO TABS
75.0000 mg | ORAL_TABLET | Freq: Every day | ORAL | Status: DC
  Administered 2020-12-23 – 2021-01-19 (×28): 75 mg via ORAL
  Filled 2020-12-22 (×28): qty 1

## 2020-12-22 NOTE — Progress Notes (Signed)
Went over discharge instructions with pt. Placed in pt dc packet. Pt discharged and transported via care link to rehab facility.

## 2020-12-22 NOTE — Progress Notes (Signed)
Voicemail left for pt primary contact. updated on pt arrival to [facility]. Sheela Stack, LPN

## 2020-12-22 NOTE — Progress Notes (Signed)
PMR Admission Coordinator Pre-Admission Assessment  Patient: Alicia Guerrero is an 85 y.o., female MRN: [id number] DOB: 07/20/1935 Height: 5' 2.99" (160 cm) Weight: 62.8 kg  Insurance Information HMO:     PPO: yes     PCP:      IPA:      80/20:      OTHER:  PRIMARY: [employer] Medicare      Policy#: [id number]      Subscriber: patient CM Name: Kara Hoover       Phone#: [phone]    Fax#: [phone] I received approval for admission on 12/21/20 for admission 12/22/10-12/27/20 with updates due to Kara 12/12/2 Pre-Cert#: [id number]      Employer:  Benefits:  Phone #: [phone]     Name:  Eff. Date: 01/17/20-still active      Deduct: does not have one Out of Pocket Max: $4,500  ($395 met)    Life Max: NA CIR: $295/day co-pay with max co-pay of $1,770/admission      SNF: 100% coverage days 1-20, $188/day co-pay days 21-100 Outpatient: $35/visit co-pay     Co-Pay:  Home Health: 100% coverage      Co-Pay:  DME: 80% coverage      Co-Pay: 20% co-insurance Providers: in-network SECONDARY:       Policy#:      Phone#:   Financial Counselor:       Phone#:   The "Data Collection Information Summary" for patients in Inpatient Rehabilitation Facilities with attached "Privacy Act Statement-Health Care Records" was provided and verbally reviewed with: Patient  Emergency Contact Information Contact Information     Name Relation Home Work Mobile   Branham,[name] Son [phone]  [phone]   Linehan,Jody Son [phone]  [phone]   Rainbow,James Spouse [phone]  [phone]       Current Medical History  Patient Admitting Diagnosis: acute ischemic stroke  History of Present Illness: Pt is an 85 year old female with medical hx significant for: A-fib with biventricular ICD, cardiac arrest (09/2011), HLD, HTN,CAD,  chronic systolic heart failure, ischemic cardiomyopathy, h/o stroke with left-sided weakness. Pt presented to the hospital on 12/16/20 as a code stroke. Pt noted to have bilateral  leg weakness, left arm weakness and slurred speech. Pt was not a tPA candidate. Thrombectomy was also not recommended. CT head showed no acute abnormality. CTA head/neck without LVO but showed severe stenosis of left MCA M1 segment and short segment occlusion of distal right PCA P2 segment.  Pt not able to have MRI d/t defibrillator. Repeat CT head also unrevealing. Therapy evaluations completed and CIR recommended d/t pt's deficits in functional mobility and ability complete ADLs independently.  Complete NIHSS TOTAL: 11  Patient's medical record from [facility] has been reviewed by the rehabilitation admission coordinator and physician.  Past Medical History  Past Medical History:  Diagnosis Date   Allergic rhinitis due to pollen    Asthma    Atrial fibrillation (HCC)    Biventricular ICD (implantable cardiac defibrillator) in place    St Jude 10/2011 (implant MD: Allred)   Bradycardia    a. coreg tapered off 05/2011 => b. Metoprolol restarted after cardiac arrest 09/2011   CAD (coronary artery disease)    a. s/p stent to RCA 1996;  b. s/p cutting balloon PTCA to LAD 2001; c. s/p Cypher DES to LAD 2003;  d. NSTEMI 4/12: DES to dRCA;  e.  LHC 9/13 (after presentation with cardiac arrest and + Nz's for NSTEMI): pLAD 30% ISR, oD1 30%, dLAD   60-70%, pOM1 (small vessel) 90%, oOM2 95% (moderate to large vessel), mCFX 30%, pOM3 30-40%, dRCA stents and pPDA stents patent with 30% ISR => med Rx rec.    Cancer (HCC)    Cardiac arrest (HCC) 09/2011   a. VT/VF in community; resusc unsuccessful;  PEA in ED; multiple defibs => revived;  [place] Protocol;  c/b by VDRF, cardiogenic shock;  LHC with stable anatomy => med Rx;  difficult ween from vent => s/p trach;  d/c to SNF   Chronic eczema    hands   Chronic systolic heart failure (HCC)    Hemorrhoids    HLD (hyperlipidemia)    HTN (hypertension)    Ischemic cardiomyopathy    a. EF 40% at cath 4/12 with AL and Inf HK;  b.  Echo after  presentation with cardiac arrest 9/13:  EF 40-45% and severe inf HK to AK c/w infarction, PASP 42   Left bundle branch block    Other and unspecified ovarian cyst    Other atopic dermatitis and related conditions    Personal history of hyperthyroidism    Personal history of malignant neoplasm of breast     Has the patient had major surgery during 100 days prior to admission? Yes  Family History   family history includes Clotting disorder in her brother; Coronary artery disease in her father; Endometrial cancer in her sister; Heart disease in her brother, brother, and sister; Hypertension in her mother; Uterine cancer in her mother.  Current Medications  Current Facility-Administered Medications:    acetaminophen (TYLENOL) tablet 650 mg, 650 mg, Oral, Q4H PRN, 650 mg at 12/17/20 2041 **OR** acetaminophen (TYLENOL) 160 MG/5ML solution 650 mg, 650 mg, Per Tube, Q4H PRN **OR** acetaminophen (TYLENOL) suppository 650 mg, 650 mg, Rectal, Q4H PRN, Adefeso, Oladapo, DO   amLODipine (NORVASC) tablet 10 mg, 10 mg, Oral, Daily, Emokpae, Courage, MD, 10 mg at 12/22/20 0928   apixaban (ELIQUIS) tablet 5 mg, 5 mg, Oral, BID, Yadav, Priyanka O, MD, 5 mg at 12/22/20 0928   aspirin chewable tablet 81 mg, 81 mg, Oral, Daily, 81 mg at 12/22/20 0928 **OR** [DISCONTINUED] aspirin suppository 300 mg, 300 mg, Rectal, Daily, Yadav, Priyanka O, MD   atorvastatin (LIPITOR) tablet 80 mg, 80 mg, Oral, Daily, Yadav, Priyanka O, MD, 80 mg at 12/22/20 0928   bisoprolol (ZEBETA) tablet 5 mg, 5 mg, Oral, Daily, Emokpae, Courage, MD, 5 mg at 12/22/20 0928   hydrALAZINE (APRESOLINE) injection 10 mg, 10 mg, Intravenous, Q6H PRN, Adefeso, Oladapo, DO, 10 mg at 12/19/20 0019   insulin aspart (novoLOG) injection 0-5 Units, 0-5 Units, Subcutaneous, QHS, Emokpae, Courage, MD   insulin aspart (novoLOG) injection 0-6 Units, 0-6 Units, Subcutaneous, TID WC, Emokpae, Courage, MD, 1 Units at 12/21/20 1634   irbesartan (AVAPRO) tablet 75  mg, 75 mg, Oral, Daily, Emokpae, Courage, MD, 75 mg at 12/22/20 0928   loratadine (CLARITIN) tablet 10 mg, 10 mg, Oral, Daily, Hall, Scott A, RPH, 10 mg at 12/22/20 0928  Patients Current Diet:  Diet Order             DIET - DYS 1 Room service appropriate? Yes; Fluid consistency: Nectar Thick  Diet effective now                   Precautions / Restrictions Precautions Precautions: Fall Precaution Comments: LUE hemiplegia Restrictions Weight Bearing Restrictions: No   Has the patient had 2 or more falls or a fall with injury in the past year?   No  Prior Activity Level Household: raely getst out of house since COVID  Prior Functional Level Self Care: Did the patient need help bathing, dressing, using the toilet or eating? Independent  Indoor Mobility: Did the patient need assistance with walking from room to room (with or without device)? Independent  Stairs: Did the patient need assistance with internal or external stairs (with or without device)? Independent  Functional Cognition: Did the patient need help planning regular tasks such as shopping or remembering to take medications? Needed some help  Patient Information Are you of Hispanic, Latino/a,or Spanish origin?: X. Patient unable to respond, A. No, not of Hispanic, Latino/a, or Spanish origin What is your race?: X. Patient unable to respond, A. White Do you need or want an interpreter to communicate with a doctor or health care staff?: 9. Unable to respond  Patient's Response To:  Health Literacy and Transportation Is the patient able to respond to health literacy and transportation needs?: No Health Literacy - How often do you need to have someone help you when you read instructions, pamphlets, or other written material from your doctor or pharmacy?: Patient unable to respond In the past 12 months, has lack of transportation kept you from medical appointments or from getting medications?: No (son answered) In the  past 12 months, has lack of transportation kept you from meetings, work, or from getting things needed for daily living?: No (son answered)  Home Assistive Devices / Equipment Home Assistive Devices/Equipment: None Home Equipment: Rolling Walker (2 wheels), Wheelchair - manual, BSC/3in1  Prior Device Use: Indicate devices/aids used by the patient prior to current illness, exacerbation or injury? None of the above  Current Functional Level Cognition  Arousal/Alertness: Awake/alert Overall Cognitive Status: Within Functional Limits for tasks assessed Orientation Level: Oriented to person, Disoriented to place, Disoriented to time, Disoriented to situation General Comments: Pt alert and oriented, conversational with some slurred speech Attention: Sustained Sustained Attention: Impaired Sustained Attention Impairment: Verbal basic, Functional basic Memory: Impaired Memory Impairment: Retrieval deficit, Decreased recall of new information, Decreased short term memory Decreased Short Term Memory: Verbal basic, Functional basic Awareness: Impaired Awareness Impairment: Emergent impairment, Intellectual impairment Problem Solving: Impaired Problem Solving Impairment: Verbal basic, Functional basic Executive Function: Organizing, Decision Making Organizing: Impaired Organizing Impairment: Verbal basic, Functional basic Decision Making: Impaired Decision Making Impairment: Verbal basic, Functional basic Behaviors: Impulsive Safety/Judgment: Impaired    Extremity Assessment (includes Sensation/Coordination)  Upper Extremity Assessment: Defer to OT evaluation RUE Deficits / Details: General weakness. LUE Deficits / Details: 2+/5 shoulder MMT with poor gross motor coordination and fine motor. Pt able to demosntrate near WFL elbow A/ROM but with slow coordination. 3+/5 MMT for composit grip. LUE Sensation: WNL LUE Coordination: decreased fine motor, decreased gross motor  Lower Extremity  Assessment: RLE deficits/detail, LLE deficits/detail RLE Deficits / Details: grossly 4+/5 LLE Deficits / Details: hip flexion 4/5, knee/ankle 4+/5    ADLs  Overall ADL's : Needs assistance/impaired Eating/Feeding: Set up, Minimal assistance, Sitting Eating/Feeding Details (indicate cue type and reason): Per clinical judgment. Grooming: Minimal assistance, Moderate assistance, Sitting Grooming Details (indicate cue type and reason): Per clinical judgment. Upper Body Bathing: Minimal assistance, Sitting, Min guard Upper Body Bathing Details (indicate cue type and reason): Per clinical judgment. Lower Body Bathing: Moderate assistance, Maximal assistance, Sitting/lateral leans Lower Body Bathing Details (indicate cue type and reason): Per clinical judgment. Upper Body Dressing : Min guard, Minimal assistance, Sitting Upper Body Dressing Details (indicate cue type and reason): Per clinical judgment.   Lower Body Dressing: Moderate assistance, Maximal assistance, Sitting/lateral leans Lower Body Dressing Details (indicate cue type and reason): Per clinical judgment. Toilet Transfer: Moderate assistance, Rolling walker (2 wheels), Stand-pivot, Maximal assistance Toilet Transfer Details (indicate cue type and reason): Partially simulated via sit to stand and attempt for lateral steps. Toileting- Clothing Manipulation and Hygiene: Minimal assistance, Moderate assistance, Sitting/lateral lean Toileting - Clothing Manipulation Details (indicate cue type and reason): Per clinical judgment. Tub/ Shower Transfer: Maximal assistance, Stand-pivot, Rolling walker (2 wheels) Tub/Shower Transfer Details (indicate cue type and reason): Per clinical judgment. Functional mobility during ADLs: Moderate assistance, Maximal assistance, Rolling walker (2 wheels) General ADL Comments: Pt demonstrates slow movement with quick fatigue and report of dizziness and need to sit. Pt limited by poor balance, postural control,  and L UE functional use.    Mobility  Overal bed mobility: Needs Assistance Bed Mobility: Rolling, Supine to Sit Rolling: Min assist, Supervision Supine to sit: Mod assist Sit to supine: Mod assist, Max assist General bed mobility comments: supv to roll supine to L sidelying, able to place RLE into hooklying and pull with BUE on bedrail; min A to roll into R sidelying, able to place LLE into hooklying and requires assist to rotate trunk to R due to LUE weakness; heavy VC for hand placement and sequencing with sitting up at EOB, mod A for trunk uprighting and scooting out to EOB with use of bedpad    Transfers  Overall transfer level: Needs assistance Equipment used: 1 person hand held assist Transfers: Sit to/from Stand, Bed to chair/wheelchair/BSC Sit to Stand: Mod assist Bed to/from chair/wheelchair/BSC transfer type:: Step pivot Step pivot transfers: Mod assist, +2 safety/equipment General transfer comment: therapist anterior to pt in "bear hug" position, blocking BLE knees, slow to power to stand with VC for sequencing, mod A +2 for safety with pivot over to recliner, able to reach with RUE for recliner, significantly increased time, VC for upright posture due to flexed trunk preferred    Ambulation / Gait / Stairs / Wheelchair Mobility  Ambulation/Gait Ambulation/Gait assistance: Mod assist, Max assist Gait Distance (Feet): 5 Feet Assistive device: Rolling walker (2 wheels) Gait Pattern/deviations: Decreased step length - left, Decreased step length - right, Decreased stance time - left, Decreased stride length General Gait Details: able to slide LLE over to recliner, weakness and blocking of BLE and multimodal cues for upright posture due to weakness and fatigue Gait velocity: decreased    Posture / Balance Dynamic Sitting Balance Sitting balance - Comments: mod A to maintain static sitting EOB with propping on RUE, LOB R requiring assist to return to upright  posture Balance Overall balance assessment: Needs assistance Sitting-balance support: Feet supported, Bilateral upper extremity supported Sitting balance-Leahy Scale: Poor Sitting balance - Comments: mod A to maintain static sitting EOB with propping on RUE, LOB R requiring assist to return to upright posture Postural control: Right lateral lean Standing balance support: During functional activity, Bilateral upper extremity supported, Reliant on assistive device for balance Standing balance-Leahy Scale: Poor Standing balance comment: using RW    Special needs/care consideration Skin Ecchymosis: arm/right, External Urinary Catheter, Bowel and Bladder incontinence and Diabetic management novoLOG 0-5 units daily at bedtime; novoLOG 0-6 units 3x daily with meals   Previous Home Environment (from acute therapy documentation) Living Arrangements: Spouse/significant other  Lives With: Spouse Available Help at Discharge: Family, Available 24 hours/day Type of Home: House Home Layout: One level Home Access: Stairs to enter Entrance Stairs-Rails: None Entrance Stairs-Number   of Steps: 1 Bathroom Shower/Tub: Walk-in shower, Tub/shower unit Bathroom Toilet: Standard Bathroom Accessibility: Yes How Accessible: Accessible via walker Home Care Services: No Additional Comments: Pt lives with husband but husband is unable to help very much due to own physical limitations. Son assists once a week per report.  Discharge Living Setting Plans for Discharge Living Setting: Patient's home Type of Home at Discharge: House Discharge Home Layout: One level Discharge Home Access: Stairs to enter Entrance Stairs-Rails: None Entrance Stairs-Number of Steps: 1 Discharge Bathroom Shower/Tub: Walk-in shower, Tub/shower unit Discharge Bathroom Toilet: Standard Discharge Bathroom Accessibility: Yes How Accessible: Accessible via walker Does the patient have any problems obtaining your medications?:  No  Social/Family/Support Systems Anticipated Caregiver: [name] Connelly (son) and other family Anticipated Caregiver's Contact Information: [phone] Caregiver Availability: 24/7 Discharge Plan Discussed with Primary Caregiver: Yes Is Caregiver In Agreement with Plan?: Yes Does Caregiver/Family have Issues with Lodging/Transportation while Pt is in Rehab?: No  Goals Patient/Family Goal for Rehab: Supervision: PT/OT, Supervision-Min A: ST Expected length of stay: 14-16 days Pt/Family Agrees to Admission and willing to participate: Yes Program Orientation Provided & Reviewed with Pt/Caregiver Including Roles  & Responsibilities: Yes  Decrease burden of Care through IP rehab admission: NA  Possible need for SNF placement upon discharge: Not anticipated  Patient Condition: I have reviewed medical records from [facility], spoken with CM, and son. I discussed via phone for inpatient rehabilitation assessment.  Patient will benefit from ongoing PT, OT, and SLP, can actively participate in 3 hours of therapy a day 5 days of the week, and can make measurable gains during the admission.  Patient will also benefit from the coordinated team approach during an Inpatient Acute Rehabilitation admission.  The patient will receive intensive therapy as well as Rehabilitation physician, nursing, social worker, and care management interventions.  Due to bladder management, bowel management, safety, skin/wound care, disease management, medication administration, and patient education the patient requires 24 hour a day rehabilitation nursing.  The patient is currently mod-max A with mobility and basic ADLs.  Discharge setting and therapy post discharge at home with home health is anticipated.  Patient has agreed to participate in the Acute Inpatient Rehabilitation Program and will admit today.  Preadmission Screen Completed By:  Lauren P Graves Madden, 12/22/2020 10:36 AM with day of updates by Armentha Branagan, MS, CCC-SLP  ______________________________________________________________________   Discussed status with Dr. Raulkar  on 12/22/20 at 930 and received approval for admission today.  Admission Coordinator:  Lauren P Graves Madden, CCC-SLP, time 930/Date 12/22/20   Assessment/Plan: Diagnosis: Acute ischemic stroke Does the need for close, 24 hr/day Medical supervision in concert with the patient's rehab needs make it unreasonable for this patient to be served in a less intensive setting? Yes Co-Morbidities requiring supervision/potential complications: HLD, CAD, atrial fibrillation, essential hypertension, type 2 DM Due to bladder management, bowel management, safety, skin/wound care, disease management, medication administration, pain management, and patient education, does the patient require 24 hr/day rehab nursing? Yes Does the patient require coordinated care of a physician, rehab nurse, PT, OT, and SLP to address physical and functional deficits in the context of the above medical diagnosis(es)? Yes Addressing deficits in the following areas: balance, endurance, locomotion, strength, transferring, bowel/bladder control, bathing, dressing, feeding, grooming, toileting, cognition, speech, swallowing, and psychosocial support Can the patient actively participate in an intensive therapy program of at least 3 hrs of therapy 5 days a week? Yes The potential for patient to make measurable gains while   on inpatient rehab is excellent Anticipated functional outcomes upon discharge from inpatient rehab: supervision PT, supervision OT, supervision SLP Estimated rehab length of stay to reach the above functional goals is: 10-14 days Anticipated discharge destination: Home 10. Overall Rehab/Functional Prognosis: excellent   MD Signature: Krutika Raulkar, MD 

## 2020-12-22 NOTE — Discharge Summary (Signed)
Physician Discharge Summary  Alicia Guerrero THY:[id number] DOB: 1935/09/29 DOA: 12/16/2020  PCP: [name], MD  Admit date: 12/16/2020 Discharge date: 12/22/2020  Time spent: 35 minutes  Recommendations for Outpatient Follow-up:  Reassess blood pressure and further adjust antihypertensive regimen Repeat basic metabolic panel and magnesium level to follow electrolytes and renal function stability.  Discharge Diagnoses:  Principal Problem:   Acute ischemic stroke [facility]) Active Problems:   HYPERLIPIDEMIA, MIXED   CAD (coronary artery disease)   Atrial fibrillation (HCC)   Ischemic cardiomyopathy   Essential hypertension   Type 2 diabetes mellitus ([place])   Acute CVA (cerebrovascular accident) [facility]) Hypokalemia/hypomagnesemia  Discharge Condition: Stable and improved.  Discharged to CIR for further rehabilitation and conditioning.  CODE STATUS: Full code  Diet recommendation: Dysphagia 1 diet with nectar thick liquids.  Heart healthy/modified carbohydrate.  Filed Weights   12/16/20 2312 12/17/20 0243  Weight: 66.2 kg 62.8 kg    History of present illness:  As per H&P written by Dr. Josephine Guerrero on 12/17/2020 Alicia Guerrero is an 85 y.o. female with medical history significant for  atrial fibrillation with biventricular implantable cardiac defibrillator, ischemic cardiomyopathy, hypertension, hyperlipidemia, chronic systolic heart failure, prior stroke with left-sided weakness and a cardiac arrest in September 2013 who presents to the emergency department due to increased left-sided weakness (from baseline) which started yesterday in the afternoon.  Patient was unable to provide history due to slurred speech, history was obtained from ED physician, ED medical record and son at bedside.  Per report, patient noted increased leg weakness around 4:30 PM when she tried to get up and she also noted increased left upper extremity from baseline.  Slurred speech was noted.  EMS was activated  and patient was taken to the ED for further evaluation and management.  She denies chest pain, shortness of breath, headache, vision changes.   ED Course:  In the emergency department, BP was 154/92 and other vital signs were within normal range.  Work-up in the ED showed normal CBC and BMP except for hypokalemia.  Alcohol level was negative, urine drug screen and urinalysis were negative.  Influenza A, B, SARS coronavirus 2 was negative. CT of head without contrast showed no acute intracranial abnormality CT angiography of head and neck showed no emergent large vessel occlusion but showed severe stenosis of the left MCA M1 segment.  Short segment occlusion of the distal right PCA P2 segment. Teleneurology was consulted and recommended further stroke work-up per ED physician.  Hospitalist was asked to admit patient for further evaluation and management.  Hospital Course:  1-acute ischemic stroke -In the setting of the small vessel disease -Unable to perform MRI due to AICD which is MRI noncompatible. -CT scan without contrast not demonstrating evolving stroke or hemorrhage. -Neurology recommended increasing Lipitor to 80 mg daily, due to ongoing intracranial stenosis adding baby aspirin 81 mg on daily basis and continue the use of Eliquis. -Continue risk factor modifications with good control of blood pressure and diabetes. -Following PT and speech therapy recommendations has been given for inpatient rehab. -Patient will be discharged to CIR for further care and conditioning. -Dysphagia 1 diet with nectar thick liquids has been recommended.  2-chronic atrial fibrillation -Continue the use of bisoprolol for rate control -Continue Eliquis for secondary prevention. -Low dose of baby aspirin on daily basis has been also recommended; will recommend close monitoring for any signs of bleeding.  3-history of coronary artery disease/ischemic cardiomyopathy -Chest pain-free -Continue aspirin, Lipitor  and  bisoprolol -Heart healthy/low-sodium diet recommended -Outpatient follow-up with cardiology as previously instructed.  4-type 2 diabetes mellitus -A1c 6.0 -Modify carbohydrate diet and resumption of metformin at discharge instructed.  5-dysphagia -Appreciate evaluation and recommendation by speech pathology service -Dysphagia 1 diet with nectar thick liquids has been recommended. -Maintain good oral care. -Further reassessment and recommendation as per speech therapy in CIR.  6-hypertension -Resume home antihypertensive agents and follow low-sodium diet. -Blood pressure stable and well-controlled currently.  7-hypokalemia/hypomagnesemia -Repleted and stabilized at time of discharge -Repeat levels at follow-up visit to assess stability.   Procedures: See below for x-ray reports 2D echo: 1. LVEF is depressed with hypokinesis of the mid/distal lateral, apical,  distal anterior walls Compared to previous echo, changes are new . Left  ventricular ejection fraction, by estimation, is 40%%. The left ventricle  has moderately decreased function.   There is mild left ventricular hypertrophy. Left ventricular diastolic  parameters are consistent with Grade I diastolic dysfunction (impaired  relaxation).   2. Right ventricular systolic function is normal. The right ventricular  size is normal.   3. Trivial mitral valve regurgitation.   4. The aortic valve is abnormal. Aortic valve regurgitation is not  visualized. Aortic valve sclerosis/calcification is present, without any  evidence of aortic stenosis.   5. The inferior vena cava is normal in size with greater than 50%  respiratory variability, suggesting right atrial pressure of 3 mmHg.   Consultations: Neurology CIR  Discharge Exam: Vitals:   12/22/20 0239 12/22/20 0629  BP: (!) 169/89 (!) 158/83  Pulse: 73 66  Resp: 19 16  Temp: 97.8 F (36.6 C) 98.1 F (36.7 C)  SpO2: 98% 97%    General: Afebrile, no chest  pain, no nausea, no vomiting.  Patient denies any difficulty breathing.  Left-sided weakness (upper extremity more than lower extremity appreciated).  No new focal deficits seen. Cardiovascular: Rate controlled, no rubs, no gallops, no JVD. Respiratory: Good air movement bilaterally; no using accessory muscles. Abdomen: Soft, nontender, nondistended, positive bowel sounds Extremities: No cyanosis or clubbing.  Discharge Instructions   Discharge Instructions     Discharge instructions   Complete by: As directed    Rehabilitation and conditioning as per CIR protocol. Dysphagia 1 diet with nectar thick liquids. Outpatient follow-up with PCP in 2 weeks after discharge from inpatient rehab. Maintain adequate hydration Heart healthy/low-sodium and modified carbohydrates diet.      Allergies as of 12/22/2020       Reactions   Coreg [carvedilol] Other (See Comments)   Severe wheezing   [name] [trandolapril] Cough   Meperidine Hcl Swelling, Other (See Comments)   Mixed with phenergan, swelling of the mouth    Penicillins Other (See Comments)   Pt has taken penicillin about 6 years ago with no side effects   Molds & Smuts Other (See Comments)   Runny nose   Tape Other (See Comments)   Redness, pulls skin off   Tetanus Toxoids Rash        Medication List     STOP taking these medications    doxycycline 100 MG tablet Commonly known as: VIBRA-TABS   spironolactone 25 MG tablet Commonly known as: ALDACTONE   telmisartan 40 MG tablet Commonly known as: MICARDIS       TAKE these medications    amLODipine 10 MG tablet Commonly known as: NORVASC Take 1 tablet (10 mg total) by mouth daily. Start taking on: December 23, 2020   aspirin 81 MG chewable tablet Chew 1  tablet (81 mg total) by mouth daily. Start taking on: December 23, 2020   atorvastatin 40 MG tablet Commonly known as: LIPITOR Take 2 tablets (80 mg total) by mouth daily. Take 1 tablet by mouth once daily What  changed:  how much to take how to take this when to take this   bisoprolol 5 MG tablet Commonly known as: ZEBETA Take 1 tablet (5 mg total) by mouth 2 (two) times daily.   cetirizine 10 MG tablet Commonly known as: ZYRTEC Take 1 tablet (10 mg total) by mouth as needed for allergies. What changed: when to take this   Cyanocobalamin 1000 MCG Subl Place 1 tablet (1,000 mcg total) under the tongue daily.   Eliquis 5 MG Tabs tablet Generic drug: apixaban Take 1 tablet by mouth twice daily   irbesartan 75 MG tablet Commonly known as: AVAPRO Take 1 tablet (75 mg total) by mouth daily. Start taking on: December 23, 2020   metFORMIN 500 MG tablet Commonly known as: GLUCOPHAGE Take 1 tablet (500 mg total) by mouth daily with breakfast.   nitroGLYCERIN 0.4 MG SL tablet Commonly known as: NITROSTAT Place 1 tablet (0.4 mg total) under the tongue every 5 (five) minutes x 3 doses as needed for chest pain. Chest pain   PRESERVISION AREDS PO Take 1 capsule by mouth 2 (two) times daily.   triamcinolone ointment 0.5 % Commonly known as: KENALOG Apply 1 application topically 2 (two) times daily. On the leg rash   Vitamin D3 50 MCG (2000 UT) capsule Take 1 capsule (2,000 Units total) by mouth daily.       Allergies  Allergen Reactions   Coreg [Carvedilol] Other (See Comments)    Severe wheezing    [name] [Trandolapril] Cough   Meperidine Hcl Swelling and Other (See Comments)    Mixed with phenergan, swelling of the mouth    Penicillins Other (See Comments)    Pt has taken penicillin about 6 years ago with no side effects   Molds & Smuts Other (See Comments)    Runny nose   Tape Other (See Comments)    Redness, pulls skin off   Tetanus Toxoids Rash    Follow-up Information     [name], [name], MD. Schedule an appointment as soon as possible for a visit in 2 week(s).   Specialty: Internal Medicine Why: after discharge from Inpatient rehab unit. Contact  information: [place] [id number] [phone]                 The results of significant diagnostics from this hospitalization (including imaging, microbiology, ancillary and laboratory) are listed below for reference.    Significant Diagnostic Studies: CT HEAD WO CONTRAST (5MM)  Result Date: 12/18/2020 CLINICAL DATA:  Neuro deficit, acute, stroke suspected; left-sided weakness EXAM: CT HEAD WITHOUT CONTRAST TECHNIQUE: Contiguous axial images were obtained from the base of the skull through the vertex without intravenous contrast. COMPARISON:  12/16/2020 FINDINGS: Brain: There is no acute intracranial hemorrhage, mass effect, or edema. No new loss of gray-white differentiation. Chronic infarcts of the left frontoparietal lobes. Small chronic left thalamic infarct. Additional nonspecific patchy and confluent hypoattenuation likely reflects stable chronic microvascular ischemic changes. There is no extra-axial fluid collection. Ventricles and sulci are within normal limits in size and configuration. Vascular: There is atherosclerotic calcification at the skull base. Skull: Calvarium is unremarkable. Sinuses/Orbits: No acute finding. Other: None. IMPRESSION: No acute intracranial hemorrhage or evidence of evolving recent infarction. Stable chronic findings  detailed above. Electronically Signed   By: Macy Mis M.D.   On: 12/18/2020 11:15   DG Swallowing Func-Speech Pathology  Result Date: 12/17/2020 Table formatting from the original result was not included. Objective Swallowing Evaluation: Type of Study: MBS-Modified Barium Swallow Study  Patient Details Name: Esterlene Atiyeh MRN: [id number] Date of Birth: [birth year] Today's Date: 12/17/2020 Time: SLP Start Time (ACUTE ONLY): 4982 -SLP Stop Time (ACUTE ONLY): 1259 SLP Time Calculation (min) (ACUTE ONLY): 14 min Past Medical History: Past Medical History: Diagnosis Date  Allergic rhinitis due to pollen   Asthma   Atrial  fibrillation ([place])   Biventricular ICD (implantable cardiac defibrillator) in place   St Jude 10/2011 (implant MD: [place]red)  Bradycardia   a. coreg tapered off 05/2011 => b. Metoprolol restarted after cardiac arrest 09/2011  CAD (coronary artery disease)   a. s/p stent to RCA 1996;  b. s/p cutting balloon PTCA to LAD 2001; c. s/p [place]her DES to LAD 2003;  d. NSTEMI 4/12: DES to dRCA;  e.  [place] 9/13 (after presentation with cardiac arrest and + Nz's for NSTEMI): pLAD 30% ISR, oD1 30%, dLAD 60-70%, pOM1 (small vessel) 90%, oOM2 95% (moderate to large vessel), mCFX 30%, pOM3 30-40%, dRCA stents and pPDA stents patent with 30% ISR => med Rx rec.   Cancer [facility])   Cardiac arrest ([place]) 09/2011  a. VT/VF in community; resusc unsuccessful;  PEA in ED; multiple defibs => revived;  [employer];  c/b by VDRF, cardiogenic shock;  LHC with stable anatomy => med Rx;  difficult ween from vent => s/p trach;  d/c to SNF  Chronic eczema   hands  Chronic systolic heart failure (HCC)   Hemorrhoids   HLD (hyperlipidemia)   HTN (hypertension)   Ischemic cardiomyopathy   a. EF 40% at cath 4/12 with AL and Inf HK;  b.  Echo after presentation with cardiac arrest 9/13:  EF 40-45% and severe inf HK to AK c/w infarction, PASP 42  Left bundle branch block   Other and unspecified ovarian cyst   Other atopic dermatitis and related conditions   Personal history of hyperthyroidism   Personal history of malignant neoplasm of breast  Past Surgical History: Past Surgical History: Procedure Laterality Date  ABDOMINAL HYSTERECTOMY    BI-VENTRICULAR IMPLANTABLE CARDIOVERTER DEFIBRILLATOR N/A 11/02/2011  Procedure: BI-VENTRICULAR IMPLANTABLE CARDIOVERTER DEFIBRILLATOR  (CRT-D);  Surgeon: Thompson Grayer, MD;  Location: [facility] C[facility] LAB;  Service: Cardiovascular;  Laterality: N/A;  BIV ICD GENERATOR CHANGEOUT N/A 05/11/2017  Procedure: BIV ICD GENERATOR CHANGEOUT;  Surgeon: [name], MD;  Location: [employer] CV LAB;  Service: Cardiovascular;  Laterality:  N/A;  BREAST LUMPECTOMY  08/24/2003  right lumpectomy+sln,T2N0,ERPR+,Her2-  CHOLECYSTECTOMY    COLONOSCOPY    CORONARY STENT PLACEMENT  may and  july 2012  LEFT HEART CATHETERIZATION WITH CORONARY ANGIOGRAM N/A 10/09/2011  Procedure: LEFT HEART CATHETERIZATION WITH CORONARY ANGIOGRAM;  Surgeon: Larey Dresser, MD;  Location: [facility] CATH LAB;  Service: Cardiovascular;  Laterality: N/A;  OOPHORECTOMY    POLYPECTOMY    PTCA    VIDEO BRONCHOSCOPY  10/26/2011  Procedure: VIDEO BRONCHOSCOPY WITHOUT FLUORO;  Surgeon: Raylene Miyamoto, MD;  Location: [facility] ENDOSCOPY;  Service: Endoscopy;  Laterality: Bilateral; HPI: 85 y.o. female with medical history significant for  atrial fibrillation with biventricular implantable cardiac defibrillator, ischemic cardiomyopathy, hypertension, hyperlipidemia, chronic systolic heart failure, prior stroke with left-sided weakness and a cardiac arrest in September 2013 who presents to the emergency department due to increased  left-sided weakness (from baseline) which started yesterday in the afternoon.  Patient was unable to provide history due to slurred speech, history was obtained from ED physician, ED medical record and son at bedside.  Per report, patient noted increased leg weakness around 4:30 PM when she tried to get up and she also noted increased left upper extremity from baseline.  Slurred speech was noted.  EMS was activated and patient was taken to the ED for further evaluation and management.  She denies chest pain, shortness of breath, headache, vision changes; CT head indicated on 12/16/20 negative; MRI pending ability to perform d/t defibrillator; BSE recommended MBS to determine safest diet/dysphagia tx required.  Subjective: "I'm thirsty"  Recommendations for follow up therapy are one component of a multi-disciplinary discharge planning process, led by the attending physician.  Recommendations may be updated based on patient status, additional functional criteria and insurance  authorization. Assessment / Plan / Recommendation Clinical Impressions 12/17/2020 Clinical Impression Pt presents with oropharyngeal dysphagia characterized by mild decreased bolus cohesion/propulsion with mechanical soft consistency and decreased tongue base retraction with all consistencies with delay in the initiaton of the swallow at the level of the valleculae/pyriform sinuses with decrased airway closure and eventual aspiration with thin liquids (PAS 5) and larger volumes of nectar-thickened liquids (PAS 3); reducing volume of nectar and providing puree consistency cleared pharynx and did not elicit aspiration. Mechanical soft consistency remained in the valleculae and with liquid wash of nectar did not clear residue effectively.  Pt also exhibits decreased sustained attention which impacts her ability to consume a more liberal diet safely.  Recommend initiating a diet of Dysphagia 1/nectar-thickened liquids with small sips and repetitive swallows/effortful swallow to clear from pharynx during meals.  Recommend pharyngeal exercises and dysphagia management while pt is in acute setting.** SLP Visit Diagnosis Dysphagia, oropharyngeal phase (R13.12);Dysarthria and anarthria (R47.1);Cognitive communication deficit (R41.841);Attention and concentration deficit Attention and concentration deficit following Other cerebrovascular disease Frontal lobe and executive function deficit following -- Impact on safety and function Mild aspiration risk;Moderate aspiration risk   Treatment Recommendations 12/17/2020 Treatment Recommendations Therapy as outlined in treatment plan below   Prognosis 12/17/2020 Prognosis for Safe Diet Advancement Good Barriers to Reach Goals Cognitive deficits Barriers/Prognosis Comment -- Diet Recommendations 12/17/2020 SLP Diet Recommendations Nectar thick liquid;Dysphagia 1 (Puree) solids Liquid Administration via Cup;Other (Comment) Medication Administration Crushed with puree Compensations Slow  rate;Small sips/bites;Minimize environmental distractions;Lingual sweep for clearance of pocketing;Multiple dry swallows after each bite/sip;Effortful swallow Postural Changes Seated upright at 90 degrees   Other Recommendations 12/17/2020 Recommended Consults -- Oral Care Recommendations Oral care BID Other Recommendations Order thickener from pharmacy Follow Up Recommendations Acute inpatient rehab (3hours/day) Assistance recommended at discharge Frequent or constant Supervision/Assistance Functional Status Assessment Patient has had a recent decline in their functional status and demonstrates the ability to make significant improvements in function in a reasonable and predictable amount of time. Frequency and Duration  12/17/2020 Speech Therapy Frequency (ACUTE ONLY) min 2x/week Treatment Duration 1 week   Oral Phase 12/17/2020 Oral Phase Impaired Oral - Pudding Teaspoon -- Oral - Pudding Cup -- Oral - Honey Teaspoon -- Oral - Honey Cup -- Oral - Nectar Teaspoon -- Oral - Nectar Cup -- Oral - Nectar Straw -- Oral - Thin Teaspoon -- Oral - Thin Cup -- Oral - Thin Straw -- Oral - Puree Impaired mastication Oral - Mech Soft Impaired mastication;Reduced posterior propulsion;Decreased bolus cohesion Oral - Regular Impaired mastication;Decreased bolus cohesion Oral - Multi-Consistency NT Oral - Pill  Reduced posterior propulsion Oral Phase - Comment Mild oral manipulation/propulsion with pill and soft solids/mechanical soft consistencies  Pharyngeal Phase 12/17/2020 Pharyngeal Phase Impaired Pharyngeal- Pudding Teaspoon -- Pharyngeal -- Pharyngeal- Pudding Cup -- Pharyngeal -- Pharyngeal- Honey Teaspoon -- Pharyngeal -- Pharyngeal- Honey Cup -- Pharyngeal -- Pharyngeal- Nectar Teaspoon NT Pharyngeal -- Pharyngeal- Nectar Cup Reduced airway/laryngeal closure;Delayed swallow initiation-vallecula;Reduced tongue base retraction;Penetration/Aspiration during swallow;Trace aspiration Pharyngeal Material enters airway, remains  ABOVE vocal cords then ejected out Pharyngeal- Nectar Straw Delayed swallow initiation-vallecula;Reduced tongue base retraction Pharyngeal Material does not enter airway Pharyngeal- Thin Teaspoon NT Pharyngeal -- Pharyngeal- Thin Cup Delayed swallow initiation-pyriform sinuses;Moderate aspiration;Reduced tongue base retraction;Penetration/Aspiration during swallow;Pharyngeal residue - valleculae;Compensatory strategies attempted (with notebox) Pharyngeal Material enters airway, CONTACTS cords and not ejected out Pharyngeal- Thin Straw Delayed swallow initiation-pyriform sinuses Pharyngeal Material enters airway, CONTACTS cords and not ejected out Pharyngeal- Puree Delayed swallow initiation-vallecula;Reduced tongue base retraction Pharyngeal Material does not enter airway Pharyngeal- Mechanical Soft Delayed swallow initiation-vallecula;Reduced tongue base retraction;Compensatory strategies attempted (with notebox) Pharyngeal Material does not enter airway Pharyngeal- Regular NT Pharyngeal -- Pharyngeal- Multi-consistency NT Pharyngeal -- Pharyngeal- Pill -- Pharyngeal Material does not enter airway Pharyngeal Comment Aspiration with thin via cup and nectar with larger volumes, but no aspiration noted with smaller volumes of nectar thickened liquids, puree; repetitive swallows/effortful swallows cleared puree from valleculae; pill removed from oral cavity  No flowsheet data found. Elvina Sidle, M.S., CCC-SLP 12/17/2020, 9:17 PM                     ECHOCARDIOGRAM COMPLETE  Result Date: 12/18/2020    ECHOCARDIOGRAM REPORT   Patient Name:   DEBROH SIELOFF Date of Exam: 12/18/2020 Medical Rec #:  [id number]       Height:       63.0 in Accession #:    [id number]      Weight:       138.4 lb Date of Birth:  Jun 24, 1935       BSA:          1.654 m Patient Age:    85 years        BP:           161/76 mmHg Patient Gender: F               HR:           65 bpm. Exam Location:  [facility] Procedure: 2D Echo, Cardiac Doppler and  Color Doppler Indications:    Stroke I63.9  History:        Patient has prior history of Echocardiogram examinations, most                 recent 06/26/2018. CAD and Previous Myocardial Infarction,                 Defibrillator, Stroke, Arrythmias:Atrial Fibrillation; Risk                 Factors:Hypertension, Dyslipidemia and Diabetes.  Sonographer:    Alvino Chapel RCS Referring Phys: [id number] OLADAPO ADEFESO IMPRESSIONS  1. LVEF is depressed with hypokinesis of the mid/distal lateral, apical, distal anterior walls Compared to previous echo, changes are new . Left ventricular ejection fraction, by estimation, is 40%%. The left ventricle has moderately decreased function.  There is mild left ventricular hypertrophy. Left ventricular diastolic parameters are consistent with Grade I diastolic dysfunction (impaired relaxation).  2. Right ventricular systolic function is normal. The right ventricular size is normal.  3. Trivial mitral valve regurgitation.  4. The aortic valve is abnormal. Aortic valve regurgitation is not visualized. Aortic valve sclerosis/calcification is present, without any evidence of aortic stenosis.  5. The inferior vena cava is normal in size with greater than 50% respiratory variability, suggesting right atrial pressure of 3 mmHg. FINDINGS  Left Ventricle: LVEF is depressed with hypokinesis of the mid/distal lateral, apical, distal anterior walls Compared to previous echo, changes are new. Left ventricular ejection fraction, by estimation, is 40%%. The left ventricle has moderately decreased function. The left ventricular internal cavity size was normal in size. There is mild left ventricular hypertrophy. Left ventricular diastolic parameters are consistent with Grade I diastolic dysfunction (impaired relaxation). Right Ventricle: The right ventricular size is normal. Right vetricular wall thickness was not assessed. Right ventricular systolic function is normal. Left Atrium: Left atrial size was  normal in size. Right Atrium: Right atrial size was normal in size. Pericardium: There is no evidence of pericardial effusion. Mitral Valve: There is mild thickening of the mitral valve leaflet(s). Mild to moderate mitral annular calcification. Trivial mitral valve regurgitation. Tricuspid Valve: The tricuspid valve is normal in structure. Tricuspid valve regurgitation is trivial. Aortic Valve: The aortic valve is abnormal. Aortic valve regurgitation is not visualized. Aortic valve sclerosis/calcification is present, without any evidence of aortic stenosis. Pulmonic Valve: The pulmonic valve was not well visualized. Pulmonic valve regurgitation is not visualized. Aorta: The aortic root is normal in size and structure. Venous: The inferior vena cava is normal in size with greater than 50% respiratory variability, suggesting right atrial pressure of 3 mmHg. IAS/Shunts: No atrial level shunt detected by color flow Doppler. Additional Comments: A device lead is visualized.  LEFT VENTRICLE PLAX 2D LVIDd:         4.70 cm   Diastology LVIDs:         3.80 cm   LV e' medial:    3.26 cm/s LV PW:         0.90 cm   LV E/e' medial:  13.1 LV IVS:        1.30 cm   LV e' lateral:   5.33 cm/s LVOT diam:     1.80 cm   LV E/e' lateral: 8.0 LV SV:         50 LV SV Index:   30 LVOT Area:     2.54 cm  RIGHT VENTRICLE RV S prime:     16.30 cm/s TAPSE (M-mode): 2.1 cm LEFT ATRIUM             Index        RIGHT ATRIUM           Index LA diam:        3.30 cm 2.00 cm/m   RA Area:     13.20 cm LA Vol (A2C):   59.9 ml 36.22 ml/m  RA Volume:   33.70 ml  20.38 ml/m LA Vol (A4C):   39.8 ml 24.07 ml/m LA Biplane Vol: 49.7 ml 30.05 ml/m  AORTIC VALVE LVOT Vmax:   85.60 cm/s LVOT Vmean:  57.400 cm/s LVOT VTI:    0.197 m  AORTA Ao Root diam: 3.30 cm MITRAL VALVE MV Area (PHT): 2.11 cm    SHUNTS MV Decel Time: 359 msec    Systemic VTI:  0.20 m MV E velocity: 42.80 cm/s  Systemic Diam: 1.80 cm MV A velocity: 80.50 cm/s MV E/A ratio:  0.53 Dorris Carnes MD Electronically signed by Dorris Carnes MD  Signature Date/Time: 12/18/2020/3:37:30 PM    Final    CUP PACEART REMOTE DEVICE CHECK  Result Date: 12/13/2020 Scheduled remote reviewed. Normal device function.  AMS episodes for known AF, CVR Burden <1%, Eliquis prescribed Next remote 91 days. LR  CT HEAD CODE STROKE WO CONTRAST  Result Date: 12/16/2020 CLINICAL DATA:  Code stroke.  Acute neurologic deficit EXAM: CT HEAD WITHOUT CONTRAST TECHNIQUE: Contiguous axial images were obtained from the base of the skull through the vertex without intravenous contrast. COMPARISON:  06/25/2018 FINDINGS: Brain: There is no mass, hemorrhage or extra-axial collection. There is generalized atrophy without lobar predilection. There is hypoattenuation of the periventricular white matter, most commonly indicating chronic ischemic microangiopathy. There is an old left thalamic small vessel infarct. Vascular: No abnormal hyperdensity of the major intracranial arteries or dural venous sinuses. No intracranial atherosclerosis. Skull: The visualized skull base, calvarium and extracranial soft tissues are normal. Sinuses/Orbits: No fluid levels or advanced mucosal thickening of the visualized paranasal sinuses. No mastoid or middle ear effusion. The orbits are normal. ASPECTS [facility] Stroke Program Early CT Score) - Ganglionic level infarction (caudate, lentiform nuclei, internal capsule, insula, M1-M3 cortex): 7 - Supraganglionic infarction (M4-M6 cortex): 3 Total score (0-10 with 10 being normal): 10 IMPRESSION: 1. No acute intracranial abnormality. 2. ASPECTS is 10. These results were called by telephone at the time of interpretation on 12/16/2020 at 11:03 pm to provider Sherwood Gambler , who verbally acknowledged these results. Electronically Signed   By: Ulyses Jarred M.D.   On: 12/16/2020 23:03   CT ANGIO HEAD NECK W WO CM W PERF (CODE STROKE)  Result Date: 12/17/2020 CLINICAL DATA:  Left-sided weakness EXAM: CT  ANGIOGRAPHY HEAD AND NECK CT PERFUSION BRAIN TECHNIQUE: Multidetector CT imaging of the head and neck was performed using the standard protocol during bolus administration of intravenous contrast. Multiplanar CT image reconstructions and MIPs were obtained to evaluate the vascular anatomy. Carotid stenosis measurements (when applicable) are obtained utilizing NASCET criteria, using the distal internal carotid diameter as the denominator. Multiphase CT imaging of the brain was performed following IV bolus contrast injection. Subsequent parametric perfusion maps were calculated using RAPID software. CONTRAST:  132m OMNIPAQUE IOHEXOL 350 MG/ML SOLN COMPARISON:  None. FINDINGS: CT HEAD FINDINGS Brain: There is no mass, hemorrhage or extra-axial collection. The size and configuration of the ventricles and extra-axial CSF spaces are normal. There is no acute or chronic infarction. The brain parenchyma is normal. Skull: The visualized skull base, calvarium and extracranial soft tissues are normal. Sinuses/Orbits: No fluid levels or advanced mucosal thickening of the visualized paranasal sinuses. No mastoid or middle ear effusion. The orbits are normal. CTA NECK FINDINGS SKELETON: There is no bony spinal canal stenosis. No lytic or blastic lesion. OTHER NECK: Normal pharynx, larynx and major salivary glands. No cervical lymphadenopathy. Unremarkable thyroid gland. UPPER CHEST: No pneumothorax or pleural effusion. No nodules or masses. AORTIC ARCH: There is calcific atherosclerosis of the aortic arch. There is no aneurysm, dissection or hemodynamically significant stenosis of the visualized portion of the aorta. Conventional 3 vessel aortic branching pattern. The visualized proximal subclavian arteries are widely patent. RIGHT CAROTID SYSTEM: No dissection, occlusion or aneurysm. Mild atherosclerotic calcification at the carotid bifurcation without hemodynamically significant stenosis. LEFT CAROTID SYSTEM: No dissection,  occlusion or aneurysm. Mild atherosclerotic calcification at the carotid bifurcation without hemodynamically significant stenosis. VERTEBRAL ARTERIES: Left dominant configuration. Both origins are clearly patent. There is no dissection, occlusion or flow-limiting stenosis to the skull base (V1-V3 segments). CTA  HEAD FINDINGS POSTERIOR CIRCULATION: --Vertebral arteries: Normal V4 segments. --Inferior cerebellar arteries: Normal. --Basilar artery: Normal. --Superior cerebellar arteries: Normal. --Posterior cerebral arteries (PCA): Short segment occlusion of the distal right P2 segment (series 9, image 19). ANTERIOR CIRCULATION: --Intracranial internal carotid arteries: Atherosclerotic calcification of the internal carotid arteries at the skull base without hemodynamically significant stenosis. --Anterior cerebral arteries (ACA): Normal. Both A1 segments are present. Patent anterior communicating artery (a-comm). --Middle cerebral arteries (MCA): Severe stenosis of the left MCA M1 segment. VENOUS SINUSES: As permitted by contrast timing, patent. ANATOMIC VARIANTS: Fetal origin of the left posterior cerebral artery. Review of the MIP images confirms the above findings. CT Brain Perfusion Findings: ASPECTS: 10 CBF (<30%) Volume: 7106m Perfusion (Tmax>6.0s) volume: 09mMismatch Volume: 106m24mnfarction Location:None IMPRESSION: 1. No emergent large vessel occlusion. 2. Severe stenosis of the left MCA M1 segment. 3. Short segment occlusion of the distal right PCA P2 segment. Aortic Atherosclerosis (ICD10-I70.0). Electronically Signed   By: KevUlyses JarredD.   On: 12/17/2020 00:00    Microbiology: Recent Results (from the past 240 hour(s))  Resp Panel by RT-PCR (Flu A&B, Covid) Nasopharyngeal Swab     Status: None   Collection Time: 12/16/20 11:48 AM   Specimen: Nasopharyngeal Swab; Nasopharyngeal(NP) swabs in vial transport medium  Result Value Ref Range Status   SARS Coronavirus 2 by RT PCR NEGATIVE NEGATIVE Final     Comment: (NOTE) SARS-CoV-2 target nucleic acids are NOT DETECTED.  The SARS-CoV-2 RNA is generally detectable in upper respiratory specimens during the acute phase of infection. The lowest concentration of SARS-CoV-2 viral copies this assay can detect is 138 copies/mL. A negative result does not preclude SARS-Cov-2 infection and should not be used as the sole basis for treatment or other patient management decisions. A negative result may occur with  improper specimen collection/handling, submission of specimen other than nasopharyngeal swab, presence of viral mutation(s) within the areas targeted by this assay, and inadequate number of viral copies(<138 copies/mL). A negative result must be combined with clinical observations, patient history, and epidemiological information. The expected result is Negative.  Fact Sheet for Patients:  httEntrepreneurPulse.com.auact Sheet for Healthcare Providers:  httIncredibleEmployment.behis test is no t yet approved or cleared by the [employer] and  has been authorized for detection and/or diagnosis of SARS-CoV-2 by FDA under an Emergency Use Authorization (EUA). This EUA will remain  in effect (meaning this test can be used) for the duration of the COVID-19 declaration under Section 564(b)(1) of the Act, 21 U.S.C.section 360bbb-3(b)(1), unless the authorization is terminated  or revoked sooner.       Influenza A by PCR NEGATIVE NEGATIVE Final   Influenza B by PCR NEGATIVE NEGATIVE Final    Comment: (NOTE) The Xpert Xpress SARS-CoV-2/FLU/RSV plus assay is intended as an aid in the diagnosis of influenza from Nasopharyngeal swab specimens and should not be used as a sole basis for treatment. Nasal washings and aspirates are unacceptable for Xpert Xpress SARS-CoV-2/FLU/RSV testing.  Fact Sheet for Patients: httEntrepreneurPulse.com.auact Sheet for Healthcare  Providers: httIncredibleEmployment.behis test is not yet approved or cleared by the [employer] and has been authorized for detection and/or diagnosis of SARS-CoV-2 by FDA under an Emergency Use Authorization (EUA). This EUA will remain in effect (meaning this test can be used) for the duration of the COVID-19 declaration under Section 564(b)(1) of the Act, 21 U.S.C. section 360bbb-3(b)(1), unless the authorization is terminated or revoked.  Performed at  [address] [place], [place] [address]      Labs: Basic Metabolic Panel: Recent Labs  Lab 12/17/20 [phone] 12/18/20 1320 12/19/20 0525 12/20/20 0437 12/21/20 0617  NA 140 141 141 140 141  K 2.6* 3.0* 3.0* 3.3* 4.2  CL 103 105 107 108 110  CO2 28 27 25 23 24   GLUCOSE 105* 98 141* 127* 131*  BUN 9 11 13 15 18   CREATININE 0.50 0.60 0.62 0.49 0.51  CALCIUM 8.6* 9.4 9.0 9.1 9.4  MG 1.3* 1.7  --   --   --   PHOS 3.1 3.4  --   --   --    Liver Function Tests: Recent Labs  Lab 12/16/20 2252 12/17/20 0616 12/18/20 1320  AST 32 30  --   ALT 30 29  --   ALKPHOS 67 64  --   BILITOT 1.0 0.9  --   PROT 6.3* 5.9*  --   ALBUMIN 3.9 3.7 3.8   CBC: Recent Labs  Lab 12/16/20 2252 12/17/20 0616  WBC 9.7 9.2  NEUTROABS 6.0  --   HGB 12.9 12.4  HCT 39.4 38.2  MCV 85.5 84.3  PLT 270 252   CBG: Recent Labs  Lab 12/21/20 0724 12/21/20 1150 12/21/20 1617 12/21/20 2118 12/22/20 0727  GLUCAP 106* 176* 193* 148* 117*    Signed:  Barton Dubois MD.  [facility] 12/22/2020, 11:15 AM

## 2020-12-22 NOTE — H&P (Signed)
Physical Medicine and Rehabilitation Admission H&P    Chief Complaint  Patient presents with   Stroke with functional deficits   CC: CVA  HPI:  Alicia Guerrero. Klump is an 74 year LH-female with history of ICM w/chronic systolic CHF s/p ICD, A fib, prior CVA with mild L-HP who was admitted to [facility] on 12/17/20 with increase in left sided weakness and slurred speech. CT head negative and neurology felt that patient with posterior circulation stroke likely cardioembolic v/s small vessel disease. MRI not done due to ICD. CTA head showed no LVO and severe stenosis L-MCA M1 segment and short segment distal R-PCA P2 segment. 2 D echo showed LVEF hypokinesis of mid/distal lateral, apical, distal anterior walls with EF 40% --new changes c/w prior echo and aortic sclerosis.    Low dose ASA added by Dr. Hortense Ramal. MBS done revealing oropharyngeal dysphagia with aspiration of thins and patient. She continues to be limited by left sided weakness with dysarthria, loss of balance to there right at EOB and attempts at standing with knees blocked and multimodal cues. CIR recommended due to functional decline. Currently complaints of cough.      Review of Systems  Constitutional:  Negative for fever.  HENT:  Negative for hearing loss and tinnitus.   Eyes:  Negative for blurred vision and double vision.  Respiratory:  Positive for cough and shortness of breath.   Cardiovascular:  Negative for chest pain, palpitations and leg swelling.  Gastrointestinal:  Negative for constipation, heartburn and nausea.  Genitourinary:  Negative for dysuria.  Musculoskeletal:  Negative for joint pain and myalgias.  Neurological:  Positive for speech change and weakness. Negative for dizziness and headaches.  Psychiatric/Behavioral:  Positive for memory loss. The patient has insomnia.     Past Medical History:  Diagnosis Date   Allergic rhinitis due to pollen    Asthma    Atrial fibrillation ([place])    Biventricular ICD (implantable  cardiac defibrillator) in place    St Jude 10/2011 (implant MD: Allred)   Bradycardia    a. coreg tapered off 05/2011 => b. Metoprolol restarted after cardiac arrest 09/2011   CAD (coronary artery disease)    a. s/p stent to RCA 1996;  b. s/p cutting balloon PTCA to LAD 2001; c. s/p Cypher DES to LAD 2003;  d. NSTEMI 4/12: DES to dRCA;  e.  [place] 9/13 (after presentation with cardiac arrest and + Nz's for NSTEMI): pLAD 30% ISR, oD1 30%, dLAD 60-70%, pOM1 (small vessel) 90%, oOM2 95% (moderate to large vessel), mCFX 30%, pOM3 30-40%, dRCA stents and pPDA stents patent with 30% ISR => med Rx rec.    Cancer [facility])    Cardiac arrest ([place]) 09/2011   a. VT/VF in community; resusc unsuccessful;  PEA in ED; multiple defibs => revived;  [employer];  c/b by VDRF, cardiogenic shock;  LHC with stable anatomy => med Rx;  difficult ween from vent => s/p trach;  d/c to SNF   Chronic eczema    hands   Chronic systolic heart failure (HCC)    Hemorrhoids    HLD (hyperlipidemia)    HTN (hypertension)    Ischemic cardiomyopathy    a. EF 40% at cath 4/12 with AL and Inf HK;  b.  Echo after presentation with cardiac arrest 9/13:  EF 40-45% and severe inf HK to AK c/w infarction, PASP 42   Left bundle branch block    Other and unspecified ovarian cyst  Other atopic dermatitis and related conditions    Personal history of hyperthyroidism    Personal history of malignant neoplasm of breast     Past Surgical History:  Procedure Laterality Date   ABDOMINAL HYSTERECTOMY     BI-VENTRICULAR IMPLANTABLE CARDIOVERTER DEFIBRILLATOR N/A 11/02/2011   Procedure: BI-VENTRICULAR IMPLANTABLE CARDIOVERTER DEFIBRILLATOR  (CRT-D);  Surgeon: Thompson Grayer, MD;  Location[facility] CATH LAB;  Service: Cardiovascular;  Laterality: N/A;   BIV ICD GENERATOR CHANGEOUT N/A 05/11/2017   Procedure: BIV ICD GENERATOR CHANGEOUT;  Surgeon: [name], MD;  Location: [place] CV LAB;  Service: Cardiovascular;  Laterality: N/A;   BREAST  LUMPECTOMY  08/24/2003   right lumpectomy+sln,T2N0,ERPR+,Her2-   CHOLECYSTECTOMY     COLONOSCOPY     CORONARY STENT PLACEMENT  may and  july 2012   LEFT HEART CATHETERIZATION WITH CORONARY ANGIOGRAM N/A 10/09/2011   Procedure: LEFT HEART CATHETERIZATION WITH CORONARY ANGIOGRAM;  Surgeon: Larey Dresser, MD;  Location: [facility] CATH LAB;  Service: Cardiovascular;  Laterality: N/A;   OOPHORECTOMY     POLYPECTOMY     PTCA     VIDEO BRONCHOSCOPY  10/26/2011   Procedure: VIDEO BRONCHOSCOPY WITHOUT FLUORO;  Surgeon: Raylene Miyamoto, MD;  Location: [facility] ENDOSCOPY;  Service: Endoscopy;  Laterality: Bilateral;    Family History  Problem Relation Age of Onset   Uterine cancer Mother    Hypertension Mother    Coronary artery disease Father    Heart disease Brother    Heart disease Sister    Endometrial cancer Sister    Heart disease Brother    Clotting disorder Brother        blood clot   Colon cancer Neg Hx     Social History:  Married. 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 Her son assists with med management. Husband "Sorry/watches TV and falls a lot". She  reports that she has never smoked. She has never used smokeless tobacco. She reports that she does not drink alcohol and does not use drugs.   Allergies  Allergen Reactions   Coreg [Carvedilol] Other (See Comments)    Severe wheezing    [name] [Trandolapril] Cough   Meperidine Hcl Swelling and Other (See Comments)    Mixed with phenergan, swelling of the mouth    Penicillins Other (See Comments)    Pt has taken penicillin about 6 years ago with no side effects   Molds & Smuts Other (See Comments)    Runny nose   Tape Other (See Comments)    Redness, pulls skin off   Tetanus Toxoids Rash    Medications Prior to Admission  Medication Sig Dispense Refill   [START ON 12/23/2020] amLODipine (NORVASC) 10 MG tablet Take 1 tablet (10 mg total) by mouth daily. 30 tablet 3   [START ON 12/23/2020] aspirin 81 MG chewable tablet  Chew 1 tablet (81 mg total) by mouth daily. 30 tablet 3   atorvastatin (LIPITOR) 40 MG tablet Take 2 tablets (80 mg total) by mouth daily. Take 1 tablet by mouth once daily     bisoprolol (ZEBETA) 5 MG tablet Take 1 tablet (5 mg total) by mouth 2 (two) times daily. 180 tablet 2   cetirizine (ZYRTEC) 10 MG tablet Take 1 tablet (10 mg total) by mouth as needed for allergies. (Patient taking differently: Take 10 mg by mouth daily.) 90 tablet 3   Cholecalciferol (VITAMIN D3) 50 MCG (2000 UT) capsule Take 1 capsule (2,000 Units total) by mouth daily. 100  capsule 3   Cyanocobalamin 1000 MCG SUBL Place 1 tablet (1,000 mcg total) under the tongue daily. 100 tablet 11   ELIQUIS 5 MG TABS tablet Take 1 tablet by mouth twice daily 60 tablet 0   [START ON 12/23/2020] irbesartan (AVAPRO) 75 MG tablet Take 1 tablet (75 mg total) by mouth daily. 30 tablet 3   metFORMIN (GLUCOPHAGE) 500 MG tablet Take 1 tablet (500 mg total) by mouth daily with breakfast. 90 tablet 3   Multiple Vitamins-Minerals (PRESERVISION AREDS PO) Take 1 capsule by mouth 2 (two) times daily.     nitroGLYCERIN (NITROSTAT) 0.4 MG SL tablet Place 1 tablet (0.4 mg total) under the tongue every 5 (five) minutes x 3 doses as needed for chest pain. Chest pain 20 tablet 3   triamcinolone ointment (KENALOG) 0.5 % Apply 1 application topically 2 (two) times daily. On the leg rash (Patient not taking: Reported on 12/18/2020) 45 g 3    Drug Regimen Review  Drug regimen was reviewed and remains appropriate with no significant issues identified   Home: Home Living Family/patient expects to be discharged to:: Private residence Living Arrangements: Spouse/significant other Available Help at Discharge: Family, Available 24 hours/day Type of Home: House Home Access: Stairs to enter [employer] of Steps: 1 Entrance Stairs-Rails: None Home Layout: One level Bathroom Shower/Tub: Gaffer, Chiropodist:  Standard Bathroom Accessibility: Yes Home Equipment: Conservation officer, nature (2 wheels), Wheelchair - manual, BSC/3in1 Additional Comments: Pt lives with husband but husband is unable to help very much due to own physical limitations. Son assists once a week per report.  Lives With: Spouse   Functional History: Prior Function Prior Level of Function : Independent/Modified Independent Mobility Comments: Pt reports indepdnent mobility mostly household without AD. ADLs Comments: Pt reports independence for ADL's with some assist for IADL's from son.   Functional Status:  Mobility: Bed Mobility Overal bed mobility: Needs Assistance Bed Mobility: Rolling, Supine to Sit Rolling: Min assist, Supervision Supine to sit: Mod assist Sit to supine: Mod assist, Max assist General bed mobility comments: supv to roll supine to L sidelying, able to place RLE into hooklying and pull with BUE on bedrail; min A to roll into R sidelying, able to place LLE into hooklying and requires assist to rotate trunk to R due to LUE weakness; heavy VC for hand placement and sequencing with sitting up at EOB, mod A for trunk uprighting and scooting out to EOB with use of bedpad Transfers Overall transfer level: Needs assistance Equipment used: 1 person hand held assist Transfers: Sit to/from Stand, Bed to chair/wheelchair/BSC Sit to Stand: Mod assist Bed to/from chair/wheelchair/BSC transfer type:: Step pivot Step pivot transfers: Mod assist, +2 safety/equipment General transfer comment: therapist anterior to pt in "bear hug" position, blocking BLE knees, slow to power to stand with VC for sequencing, mod A +2 for safety with pivot over to recliner, able to reach with RUE for recliner, significantly increased time, VC for upright posture due to flexed trunk preferred Ambulation/Gait Ambulation/Gait assistance: Mod assist, Max assist Gait Distance (Feet): 5 Feet Assistive device: Rolling walker (2 wheels) Gait  Pattern/deviations: Decreased step length - left, Decreased step length - right, Decreased stance time - left, Decreased stride length General Gait Details: able to slide LLE over to recliner, weakness and blocking of BLE and multimodal cues for upright posture due to weakness and fatigue Gait velocity: decreased   ADL: ADL Overall ADL's : Needs assistance/impaired Eating/Feeding: Set up, Minimal assistance, Sitting Eating/Feeding  Details (indicate cue type and reason): Per clinical judgment. Grooming: Minimal assistance, Moderate assistance, Sitting Grooming Details (indicate cue type and reason): Per clinical judgment. Upper Body Bathing: Minimal assistance, Sitting, Min guard Upper Body Bathing Details (indicate cue type and reason): Per clinical judgment. Lower Body Bathing: Moderate assistance, Maximal assistance, Sitting/lateral leans Lower Body Bathing Details (indicate cue type and reason): Per clinical judgment. Upper Body Dressing : Min guard, Minimal assistance, Sitting Upper Body Dressing Details (indicate cue type and reason): Per clinical judgment. Lower Body Dressing: Moderate assistance, Maximal assistance, Sitting/lateral leans Lower Body Dressing Details (indicate cue type and reason): Per clinical judgment. Toilet Transfer: Moderate assistance, Rolling walker (2 wheels), Stand-pivot, Maximal assistance Toilet Transfer Details (indicate cue type and reason): Partially simulated via sit to stand and attempt for lateral steps. Toileting- Clothing Manipulation and Hygiene: Minimal assistance, Moderate assistance, Sitting/lateral lean Toileting - Clothing Manipulation Details (indicate cue type and reason): Per clinical judgment. Tub/ Shower Transfer: Maximal assistance, Stand-pivot, Rolling walker (2 wheels) Tub/Shower Transfer Details (indicate cue type and reason): Per clinical judgment. Functional mobility during ADLs: Moderate assistance, Maximal assistance, Rolling walker  (2 wheels) General ADL Comments: Pt demonstrates slow movement with quick fatigue and report of dizziness and need to sit. Pt limited by poor balance, postural control, and L UE functional use.   Cognition: Cognition Overall Cognitive Status: Within Functional Limits for tasks assessed Arousal/Alertness: Awake/alert Orientation Level: Oriented to person, Disoriented to place, Disoriented to time, Disoriented to situation Year: 2022 Month: December Day of Week: Correct Attention: Sustained Sustained Attention: Impaired Sustained Attention Impairment: Verbal basic, Functional basic Memory: Impaired Memory Impairment: Retrieval deficit, Decreased recall of new information, Decreased short term memory Decreased Short Term Memory: Verbal basic, Functional basic Awareness: Impaired Awareness Impairment: Emergent impairment, Intellectual impairment Problem Solving: Impaired Problem Solving Impairment: Verbal basic, Functional basic Executive Function: Organizing, Decision Making Organizing: Impaired Organizing Impairment: Verbal basic, Functional basic Decision Making: Impaired Decision Making Impairment: Verbal basic, Functional basic Behaviors: Impulsive Safety/Judgment: Impaired Cognition Arousal/Alertness: Awake/alert Behavior During Therapy: WFL for tasks assessed/performed Overall Cognitive Status: Within Functional Limits for tasks assessed General Comments: Pt alert and oriented, conversational with some slurred speech    Blood pressure (!) 142/72, pulse 73, temperature (!) 97.5 F (36.4 C), temperature source Oral, resp. rate 16, height 5' 3" (1.6 m), weight 64.5 kg, SpO2 95 %. Physical Exam Gen: no distress, normal appearing HEENT: oral mucosa pink and moist, NCAT Cardio: Reg rate Chest: normal effort, normal rate of breathing Abd: soft, non-distended Ext: no edema Psych: pleasant, normal affect Skin:    Comments: Right upper arm with long well healed incision (post  cancer resection)   Neurological:     Mental Status: She is alert and oriented to person, place, and time.     Comments: Left facial weakness with moderate dysarthria with halting speech and occasional wet voice. Able to state age, DOB with cues but current Month - sept, celebrated 4th of July. Left hemiplegia UE>LE.  LUE 2/5 proximally, 3/5 distally, RUE 5/5, bilateral lower extremities 4/5  Results for orders placed or performed during the hospital encounter of 12/22/20 (from the past 48 hour(s))  Glucose, capillary     Status: Abnormal   Collection Time: 12/22/20  5:31 PM  Result Value Ref Range   Glucose-Capillary 134 (H) 70 - 99 mg/dL    Comment: Glucose reference range applies only to samples taken after fasting for at least 8 hours.   No results found.  Medical Problem List and Plan: 1. Functional deficits secondary to posterior circulation stroke  -patient may shower  -ELOS/Goals: 10-14 days S  -Admit to CIR 2.  Antithrombotics: -DVT/anticoagulation:  Pharmaceutical: Other (comment)--Eliquis  -antiplatelet therapy: ASA added  3. Pain Management: Tylenol prn.  4. Mood: LCSW to follow for evaluation and support.   -antipsychotic agents: N/A 5. Neuropsych: This patient is not fully capable of making decisions on capable own behalf. 6. Skin/Wound Care: Routine pressure relief measures 7. Fluids/Electrolytes/Nutrition:  N/A 8. CAF/ICM s/p ICD: Monitor HR TID. Continue Eliquis and bisoprolol.  --monitor for symptoms with increase in activity.  86. H/o CVA with L-HP: Was independent without AD PTA. 10. HTN: Monitor BP TID--SBP goal 120-140 range per neur due to MCA/PCA stenosis  --continue bisoprolol and Avapro.  11. Dysphagia: Continue D1, nectar liquids. Recheck BMET in am.  --does not like current diet. Discussed dysphagia and need for modified diet.  12. T2DM: Hgb A1C- 6.0. Continue to hold metformin  --monitor BS ac/hs and use SSI for now.  13. Chronic intermittent  insomnia: Will resume Melatonin (used 10 mg prn PTA) 14. Chronic congeseted cough: For 5-6 years and felt to be due to allergies. --Will add Claritin.   I have personally performed a face to face diagnostic evaluation, including, but not limited to relevant history and physical exam findings, of this patient and developed relevant assessment and plan.  Additionally, I have reviewed and concur with the physician assistant's documentation above.  Bary Leriche, PA-C   Izora Ribas, MD 12/22/2020

## 2020-12-22 NOTE — Progress Notes (Signed)
Inpatient Rehabilitation Admission Medication Review by a Pharmacist  A complete drug regimen review was completed for this patient to identify any potential clinically significant medication issues.  High Risk Drug Classes Is patient taking? Indication by Medication  Antipsychotic Yes Compazine- N/V  Anticoagulant Yes Apixaban- A. Fib  Antibiotic No   Opioid No   Antiplatelet Yes Baby aspirin- CVA prophylaxis  Hypoglycemics/insulin Yes iSS- T2DM  Vasoactive Medication Yes NitroSL, Bisoprolol, norvasc, avapro- angina, CAD, hypertension  Chemotherapy No   Other Yes Lipitor (dose changed from 40 mg to 80 mg)- HLD B-12- B-12 deficiency Claritin- seasonal allergies     Type of Medication Issue Identified Description of Issue Recommendation(s)  Drug Interaction(s) (clinically significant)     Duplicate Therapy     Allergy     No Medication Administration End Date     Incorrect Dose     Additional Drug Therapy Needed     Significant med changes from prior encounter (inform family/care partners about these prior to discharge).    Other  PTA metformin    PTA Vitamin D3   PTA Aldactone   PTA Triamcinolone 0.5% ointment Metformin- restart at discharge from medical unit per discharge note as tolerated. eGFR >60  Vitamin D3- restart as medically necessary  Aldactone- restart if medically necessary  Triamcinolone oint- restart if needed for rash    Clinically significant medication issues were identified that warrant physician communication and completion of prescribed/recommended actions by midnight of the next day:  No   Time spent performing this drug regimen review (minutes):  30   Clevon Khader BS, PharmD, BCPS Clinical Pharmacist 12/22/2020 2:19 PM

## 2020-12-22 NOTE — Progress Notes (Signed)
Called and gave report to nurse Caryl Pina at receiving facility (CIR). Pt awaiting dc.

## 2020-12-22 NOTE — Progress Notes (Signed)
Inpatient Rehab Admissions Coordinator:   I have a bed for this Pt. And will plan to admit to CIR today.. RN may call report to [phone].  Clemens Catholic, [place], [place] Admissions Coordinator  [phone] ([place]) [phone] (office)

## 2020-12-22 NOTE — H&P (Shared)
Physical Medicine and Rehabilitation Admission H&P    Chief Complaint  Patient presents with   Stroke with functional deficits    HPI:  Alicia Guerrero. Benard is an 86 year LH-female with history of ICM w/chronic systolic CHF s/p ICD, A fib, prior CVA with mild L-HP who was admitted to [facility] on 12/17/20 with increase in left sided weakness and slurred speech. CT head negative and neurology felt that patient with posterior circulation stroke likely cardioembolic v/s small vessel disease. MRI not done due to ICD. CTA head showed no LVO and severe stenosis L-MCA M1 segment and short segment distal R-PCA P2 segment. 2 D echo showed LVEF hypokinesis of mid/distal lateral, apical, distal anterior walls with EF 40% --new changes c/w prior echo and aortic sclerosis.    Low dose ASA added by Dr. Hortense Ramal. MBS done revealing oropharyngeal dysphagia with aspiration of thins and patient. She continues to be limited by left sided weakness with dysarthria, loss of balance to there right at EOB and attempts at standing with knees blocked and multimodal cues. CIR recommended due to functional decline.     Review of Systems  Constitutional:  Negative for fever.  HENT:  Negative for hearing loss and tinnitus.   Eyes:  Negative for blurred vision and double vision.  Respiratory:  Positive for cough and shortness of breath.   Cardiovascular:  Negative for chest pain, palpitations and leg swelling.  Gastrointestinal:  Negative for constipation, heartburn and nausea.  Genitourinary:  Negative for dysuria.  Musculoskeletal:  Negative for joint pain and myalgias.  Neurological:  Positive for speech change and weakness. Negative for dizziness and headaches.  Psychiatric/Behavioral:  Positive for memory loss. The patient has insomnia.     Past Medical History:  Diagnosis Date   Allergic rhinitis due to pollen    Asthma    Atrial fibrillation ([place])    Biventricular ICD (implantable cardiac defibrillator) in place    [place] 10/2011 (implant MD: Allred)   Bradycardia    a. coreg tapered off 05/2011 => b. Metoprolol restarted after cardiac arrest 09/2011   CAD (coronary artery disease)    a. s/p stent to RCA 1996;  b. s/p cutting balloon PTCA to LAD 2001; c. s/p Cypher DES to LAD 2003;  d. NSTEMI 4/12: DES to dRCA;  e.  [place] 9/13 (after presentation with cardiac arrest and + Nz's for NSTEMI): pLAD 30% ISR, oD1 30%, dLAD 60-70%, pOM1 (small vessel) 90%, oOM2 95% (moderate to large vessel), mCFX 30%, pOM3 30-40%, dRCA stents and pPDA stents patent with 30% ISR => med Rx rec.    Cancer [facility])    Cardiac arrest ([place]) 09/2011   a. VT/VF in community; resusc unsuccessful;  PEA in ED; multiple defibs => revived;  [employer];  c/b by VDRF, cardiogenic shock;  LHC with stable anatomy => med Rx;  difficult ween from vent => s/p trach;  d/c to SNF   Chronic eczema    hands   Chronic systolic heart failure (HCC)    Hemorrhoids    HLD (hyperlipidemia)    HTN (hypertension)    Ischemic cardiomyopathy    a. EF 40% at cath 4/12 with AL and Inf HK;  b.  Echo after presentation with cardiac arrest 9/13:  EF 40-45% and severe inf HK to AK c/w infarction, PASP 42   Left bundle branch block    Other and unspecified ovarian cyst    Other atopic dermatitis and related conditions  Personal history of hyperthyroidism    Personal history of malignant neoplasm of breast     Past Surgical History:  Procedure Laterality Date   ABDOMINAL HYSTERECTOMY     BI-VENTRICULAR IMPLANTABLE CARDIOVERTER DEFIBRILLATOR N/A 11/02/2011   Procedure: BI-VENTRICULAR IMPLANTABLE CARDIOVERTER DEFIBRILLATOR  (CRT-D);  Surgeon: Thompson Grayer, MD;  Location: [facility] CATH LAB;  Service: Cardiovascular;  Laterality: N/A;   BIV ICD GENERATOR CHANGEOUT N/A 05/11/2017   Procedure: BIV ICD GENERATOR CHANGEOUT;  Surgeon: [name], MD;  Location: [place] CV LAB;  Service: Cardiovascular;  Laterality: N/A;   BREAST LUMPECTOMY  08/24/2003   right  lumpectomy+sln,T2N0,ERPR+,Her2-   CHOLECYSTECTOMY     COLONOSCOPY     CORONARY STENT PLACEMENT  may and  july 2012   LEFT HEART CATHETERIZATION WITH CORONARY ANGIOGRAM N/A 10/09/2011   Procedure: LEFT HEART CATHETERIZATION WITH CORONARY ANGIOGRAM;  Surgeon: Larey Dresser, MD;  Location: [facility] CATH LAB;  Service: Cardiovascular;  Laterality: N/A;   OOPHORECTOMY     POLYPECTOMY     PTCA     VIDEO BRONCHOSCOPY  10/26/2011   Procedure: VIDEO BRONCHOSCOPY WITHOUT FLUORO;  Surgeon: Raylene Miyamoto, MD;  Location: [facility] ENDOSCOPY;  Service: Endoscopy;  Laterality: Bilateral;    Family History  Problem Relation Age of Onset   Uterine cancer Mother    Hypertension Mother    Coronary artery disease Father    Heart disease Brother    Heart disease Sister    Endometrial cancer Sister    Heart disease Brother    Clotting disorder Brother        blood clot   Colon cancer Neg Hx     Social History:  Married. 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 Her son assists with med management. Husband "Sorry/watches TV and falls a lot". She  reports that she has never smoked. She has never used smokeless tobacco. She reports that she does not drink alcohol and does not use drugs.   Allergies  Allergen Reactions   Coreg [Carvedilol] Other (See Comments)    Severe wheezing    [name] [Trandolapril] Cough   Meperidine Hcl Swelling and Other (See Comments)    Mixed with phenergan, swelling of the mouth    Penicillins Other (See Comments)    Pt has taken penicillin about 6 years ago with no side effects   Molds & Smuts Other (See Comments)    Runny nose   Tape Other (See Comments)    Redness, pulls skin off   Tetanus Toxoids Rash    Medications Prior to Admission  Medication Sig Dispense Refill   bisoprolol (ZEBETA) 5 MG tablet Take 1 tablet (5 mg total) by mouth 2 (two) times daily. 180 tablet 2   Cholecalciferol (VITAMIN D3) 50 MCG (2000 UT) capsule Take 1 capsule (2,000 Units total)  by mouth daily. 100 capsule 3   Cyanocobalamin 1000 MCG SUBL Place 1 tablet (1,000 mcg total) under the tongue daily. 100 tablet 11   ELIQUIS 5 MG TABS tablet Take 1 tablet by mouth twice daily 60 tablet 0   metFORMIN (GLUCOPHAGE) 500 MG tablet Take 1 tablet (500 mg total) by mouth daily with breakfast. 90 tablet 3   Multiple Vitamins-Minerals (PRESERVISION AREDS PO) Take 1 capsule by mouth 2 (two) times daily.     nitroGLYCERIN (NITROSTAT) 0.4 MG SL tablet Place 1 tablet (0.4 mg total) under the tongue every 5 (five) minutes x 3 doses as needed for chest pain. Chest pain 20  tablet 3   spironolactone (ALDACTONE) 25 MG tablet Take 1 tablet (25 mg total) by mouth daily. 90 tablet 3   telmisartan (MICARDIS) 40 MG tablet Take 1 tablet by mouth once daily 90 tablet 2   [DISCONTINUED] atorvastatin (LIPITOR) 40 MG tablet Take 1 tablet by mouth once daily 90 tablet 3   cetirizine (ZYRTEC) 10 MG tablet Take 1 tablet (10 mg total) by mouth as needed for allergies. (Patient taking differently: Take 10 mg by mouth daily.) 90 tablet 3   doxycycline (VIBRA-TABS) 100 MG tablet Take 1 tablet (100 mg total) by mouth 2 (two) times daily. (Patient not taking: Reported on 12/18/2020) 14 tablet 0   triamcinolone ointment (KENALOG) 0.5 % Apply 1 application topically 2 (two) times daily. On the leg rash (Patient not taking: Reported on 12/18/2020) 45 g 3    Drug Regimen Review { DRUG REGIMEN [id number]}  Home: Home Living Family/patient expects to be discharged to:: Private residence Living Arrangements: Spouse/significant other Available Help at Discharge: Family, Available 24 hours/day Type of Home: House Home Access: Stairs to enter [employer] of Steps: 1 Entrance Stairs-Rails: None Home Layout: One level Bathroom Shower/Tub: Gaffer, Chiropodist: Standard Bathroom Accessibility: Yes Home Equipment: Conservation officer, nature (2 wheels), Wheelchair - manual, BSC/3in1 Additional  Comments: Pt lives with husband but husband is unable to help very much due to own physical limitations. Son assists once a week per report.  Lives With: Spouse   Functional History: Prior Function Prior Level of Function : Independent/Modified Independent Mobility Comments: Pt reports indepdnent mobility mostly household without AD. ADLs Comments: Pt reports independence for ADL's with some assist for IADL's from son.  Functional Status:  Mobility: Bed Mobility Overal bed mobility: Needs Assistance Bed Mobility: Rolling, Supine to Sit Rolling: Min assist, Supervision Supine to sit: Mod assist Sit to supine: Mod assist, Max assist General bed mobility comments: supv to roll supine to L sidelying, able to place RLE into hooklying and pull with BUE on bedrail; min A to roll into R sidelying, able to place LLE into hooklying and requires assist to rotate trunk to R due to LUE weakness; heavy VC for hand placement and sequencing with sitting up at EOB, mod A for trunk uprighting and scooting out to EOB with use of bedpad Transfers Overall transfer level: Needs assistance Equipment used: 1 person hand held assist Transfers: Sit to/from Stand, Bed to chair/wheelchair/BSC Sit to Stand: Mod assist Bed to/from chair/wheelchair/BSC transfer type:: Step pivot Step pivot transfers: Mod assist, +2 safety/equipment General transfer comment: therapist anterior to pt in "bear hug" position, blocking BLE knees, slow to power to stand with VC for sequencing, mod A +2 for safety with pivot over to recliner, able to reach with RUE for recliner, significantly increased time, VC for upright posture due to flexed trunk preferred Ambulation/Gait Ambulation/Gait assistance: Mod assist, Max assist Gait Distance (Feet): 5 Feet Assistive device: Rolling walker (2 wheels) Gait Pattern/deviations: Decreased step length - left, Decreased step length - right, Decreased stance time - left, Decreased stride  length General Gait Details: able to slide LLE over to recliner, weakness and blocking of BLE and multimodal cues for upright posture due to weakness and fatigue Gait velocity: decreased    ADL: ADL Overall ADL's : Needs assistance/impaired Eating/Feeding: Set up, Minimal assistance, Sitting Eating/Feeding Details (indicate cue type and reason): Per clinical judgment. Grooming: Minimal assistance, Moderate assistance, Sitting Grooming Details (indicate cue type and reason): Per clinical judgment. Upper Body Bathing:  Minimal assistance, Sitting, Min guard Upper Body Bathing Details (indicate cue type and reason): Per clinical judgment. Lower Body Bathing: Moderate assistance, Maximal assistance, Sitting/lateral leans Lower Body Bathing Details (indicate cue type and reason): Per clinical judgment. Upper Body Dressing : Min guard, Minimal assistance, Sitting Upper Body Dressing Details (indicate cue type and reason): Per clinical judgment. Lower Body Dressing: Moderate assistance, Maximal assistance, Sitting/lateral leans Lower Body Dressing Details (indicate cue type and reason): Per clinical judgment. Toilet Transfer: Moderate assistance, Rolling walker (2 wheels), Stand-pivot, Maximal assistance Toilet Transfer Details (indicate cue type and reason): Partially simulated via sit to stand and attempt for lateral steps. Toileting- Clothing Manipulation and Hygiene: Minimal assistance, Moderate assistance, Sitting/lateral lean Toileting - Clothing Manipulation Details (indicate cue type and reason): Per clinical judgment. Tub/ Shower Transfer: Maximal assistance, Stand-pivot, Rolling walker (2 wheels) Tub/Shower Transfer Details (indicate cue type and reason): Per clinical judgment. Functional mobility during ADLs: Moderate assistance, Maximal assistance, Rolling walker (2 wheels) General ADL Comments: Pt demonstrates slow movement with quick fatigue and report of dizziness and need to sit.  Pt limited by poor balance, postural control, and L UE functional use.  Cognition: Cognition Overall Cognitive Status: Within Functional Limits for tasks assessed Arousal/Alertness: Awake/alert Orientation Level: Oriented to person, Disoriented to place, Disoriented to time, Disoriented to situation Year: 2022 Month: December Day of Week: Correct Attention: Sustained Sustained Attention: Impaired Sustained Attention Impairment: Verbal basic, Functional basic Memory: Impaired Memory Impairment: Retrieval deficit, Decreased recall of new information, Decreased short term memory Decreased Short Term Memory: Verbal basic, Functional basic Awareness: Impaired Awareness Impairment: Emergent impairment, Intellectual impairment Problem Solving: Impaired Problem Solving Impairment: Verbal basic, Functional basic Executive Function: Organizing, Decision Making Organizing: Impaired Organizing Impairment: Verbal basic, Functional basic Decision Making: Impaired Decision Making Impairment: Verbal basic, Functional basic Behaviors: Impulsive Safety/Judgment: Impaired Cognition Arousal/Alertness: Awake/alert Behavior During Therapy: WFL for tasks assessed/performed Overall Cognitive Status: Within Functional Limits for tasks assessed General Comments: Pt alert and oriented, conversational with some slurred speech   Blood pressure (!) 158/83, pulse 66, temperature 98.1 F (36.7 C), resp. rate 16, height 5' 2.99" (1.6 m), weight 62.8 kg, SpO2 97 %. Physical Exam Vitals and nursing note reviewed.  Constitutional:      Appearance: Normal appearance.  Skin:    Comments: Right upper arm with long well healed incision (post cancer resection)   Neurological:     Mental Status: She is alert and oriented to person, place, and time.     Comments: Left facial weakness with moderate dysarthria with halting speech and occasional wet voice. Able to state age, DOB with cues but current Month - sept,  celebrated 4th of July. Left hemiplegia UE>LE.     Results for orders placed or performed during the hospital encounter of 12/16/20 (from the past 48 hour(s))  Glucose, capillary     Status: Abnormal   Collection Time: 12/20/20 11:33 AM  Result Value Ref Range   Glucose-Capillary 168 (H) 70 - 99 mg/dL    Comment: Glucose reference range applies only to samples taken after fasting for at least 8 hours.  Glucose, capillary     Status: Abnormal   Collection Time: 12/20/20  4:59 PM  Result Value Ref Range   Glucose-Capillary 192 (H) 70 - 99 mg/dL    Comment: Glucose reference range applies only to samples taken after fasting for at least 8 hours.  Glucose, capillary     Status: Abnormal   Collection Time: 12/20/20  9:01 PM  Result Value Ref Range   Glucose-Capillary 153 (H) 70 - 99 mg/dL    Comment: Glucose reference range applies only to samples taken after fasting for at least 8 hours.  Basic metabolic panel     Status: Abnormal   Collection Time: 12/21/20  6:17 AM  Result Value Ref Range   Sodium 141 135 - 145 mmol/L   Potassium 4.2 3.5 - 5.1 mmol/L    Comment: DELTA CHECK NOTED   Chloride 110 98 - 111 mmol/L   CO2 24 22 - 32 mmol/L   Glucose, Bld 131 (H) 70 - 99 mg/dL    Comment: Glucose reference range applies only to samples taken after fasting for at least 8 hours.   BUN 18 8 - 23 mg/dL   Creatinine, Ser 0.51 0.44 - 1.00 mg/dL   Calcium 9.4 8.9 - 10.3 mg/dL   GFR, Estimated >60 >60 mL/min    Comment: (NOTE) Calculated using the CKD-EPI Creatinine Equation (2021)    Anion gap 7 5 - 15    Comment: Performed at [facility], [address], [place], [place] [address]  Glucose, capillary     Status: Abnormal   Collection Time: 12/21/20  7:24 AM  Result Value Ref Range   Glucose-Capillary 106 (H) 70 - 99 mg/dL    Comment: Glucose reference range applies only to samples taken after fasting for at least 8 hours.  Glucose, capillary     Status: Abnormal   Collection Time:  12/21/20 11:50 AM  Result Value Ref Range   Glucose-Capillary 176 (H) 70 - 99 mg/dL    Comment: Glucose reference range applies only to samples taken after fasting for at least 8 hours.  Glucose, capillary     Status: Abnormal   Collection Time: 12/21/20  4:17 PM  Result Value Ref Range   Glucose-Capillary 193 (H) 70 - 99 mg/dL    Comment: Glucose reference range applies only to samples taken after fasting for at least 8 hours.   Comment 1 Notify RN    Comment 2 Document in Chart   Glucose, capillary     Status: Abnormal   Collection Time: 12/21/20  9:18 PM  Result Value Ref Range   Glucose-Capillary 148 (H) 70 - 99 mg/dL    Comment: Glucose reference range applies only to samples taken after fasting for at least 8 hours.  Glucose, capillary     Status: Abnormal   Collection Time: 12/22/20  7:27 AM  Result Value Ref Range   Glucose-Capillary 117 (H) 70 - 99 mg/dL    Comment: Glucose reference range applies only to samples taken after fasting for at least 8 hours.  Glucose, capillary     Status: Abnormal   Collection Time: 12/22/20 11:10 AM  Result Value Ref Range   Glucose-Capillary 176 (H) 70 - 99 mg/dL    Comment: Glucose reference range applies only to samples taken after fasting for at least 8 hours.   No results found.     Medical Problem List and Plan: 1. Functional deficits secondary to ***  -patient may *** shower  -ELOS/Goals: *** 2.  Antithrombotics: -DVT/anticoagulation:  Pharmaceutical: Other (comment)--Eliquis  -antiplatelet therapy: ASA added  3. Pain Management: Tylenol prn.  4. Mood: LCSW to follow for evaluation and support.   -antipsychotic agents: N/A 5. Neuropsych: This patient is not fully capable of making decisions on capable own behalf. 6. Skin/Wound Care: Routine pressure relief measures 7. Fluids/Electrolytes/Nutrition:  N/A 8. CAF/ICM s/p ICD: Monitor HR TID.  Continue Eliquis and bisoprolol.  --monitor for symptoms with increase in activity.   60. H/o CVA with L-HP: Was independent without AD PTA. 10. HTN: Monitor BP TID--SBP goal 120-140 range per neur due to MCA/PCA stenosis  --continue bisoprolol and Avapro.  11. Dysphagia: Continue D1, nectar liquids. Recheck BMET in am.  --does not like current diet. Discussed dysphagia and need for modified diet.  12. T2DM: Hgb A1C- 6.0. Continue to hold metformin  --monitor BS ac/hs and use SSI for now.  13. Chronic intermittent insomnia: Will resume Melatonin (used 10 mg prn PTA) 14. Chronic congeseted cough: For 5-6 years and felt to be due to allergies. --Will add Claritin.     ***  Bary Leriche, PA-C 12/22/2020

## 2020-12-23 ENCOUNTER — Inpatient Hospital Stay (HOSPITAL_COMMUNITY): Payer: Medicare HMO

## 2020-12-23 DIAGNOSIS — I6300 Cerebral infarction due to thrombosis of unspecified precerebral artery: Secondary | ICD-10-CM

## 2020-12-23 LAB — CBC WITH DIFFERENTIAL/PLATELET
Abs Immature Granulocytes: 0.04 10*3/uL (ref 0.00–0.07)
Basophils Absolute: 0.1 10*3/uL (ref 0.0–0.1)
Basophils Relative: 1 %
Eosinophils Absolute: 1 10*3/uL — ABNORMAL HIGH (ref 0.0–0.5)
Eosinophils Relative: 10 %
HCT: 42.3 % (ref 36.0–46.0)
Hemoglobin: 14.1 g/dL (ref 12.0–15.0)
Immature Granulocytes: 0 %
Lymphocytes Relative: 19 %
Lymphs Abs: 1.8 10*3/uL (ref 0.7–4.0)
MCH: 27.5 pg (ref 26.0–34.0)
MCHC: 33.3 g/dL (ref 30.0–36.0)
MCV: 82.5 fL (ref 80.0–100.0)
Monocytes Absolute: 1 10*3/uL (ref 0.1–1.0)
Monocytes Relative: 10 %
Neutro Abs: 5.8 10*3/uL (ref 1.7–7.7)
Neutrophils Relative %: 60 %
Platelets: 300 10*3/uL (ref 150–400)
RBC: 5.13 MIL/uL — ABNORMAL HIGH (ref 3.87–5.11)
RDW: 13.2 % (ref 11.5–15.5)
WBC: 9.6 10*3/uL (ref 4.0–10.5)
nRBC: 0 % (ref 0.0–0.2)

## 2020-12-23 LAB — GLUCOSE, CAPILLARY
Glucose-Capillary: 148 mg/dL — ABNORMAL HIGH (ref 70–99)
Glucose-Capillary: 156 mg/dL — ABNORMAL HIGH (ref 70–99)
Glucose-Capillary: 154 mg/dL — ABNORMAL HIGH (ref 70–99)
Glucose-Capillary: 129 mg/dL — ABNORMAL HIGH (ref 70–99)

## 2020-12-23 LAB — COMPREHENSIVE METABOLIC PANEL
ALT: 18 U/L (ref 0–44)
AST: 19 U/L (ref 15–41)
Albumin: 3.3 g/dL — ABNORMAL LOW (ref 3.5–5.0)
Alkaline Phosphatase: 64 U/L (ref 38–126)
Anion gap: 8 (ref 5–15)
BUN: 17 mg/dL (ref 8–23)
CO2: 24 mmol/L (ref 22–32)
Calcium: 9.7 mg/dL (ref 8.9–10.3)
Chloride: 100 mmol/L (ref 98–111)
Creatinine, Ser: 0.69 mg/dL (ref 0.44–1.00)
GFR, Estimated: >60 mL/min (ref >60)
Glucose, Bld: 147 mg/dL — ABNORMAL HIGH (ref 70–99)
Potassium: 4.3 mmol/L (ref 3.5–5.1)
Sodium: 132 mmol/L — ABNORMAL LOW (ref 135–145)
Total Bilirubin: 1.1 mg/dL (ref 0.3–1.2)
Total Protein: 6.4 g/dL — ABNORMAL LOW (ref 6.5–8.1)

## 2020-12-23 NOTE — Progress Notes (Signed)
MD Kirsteins notified on  rounds this AM that pt had possible aspiration last evening. Chest XR ordered. Sheela Stack

## 2020-12-23 NOTE — Evaluation (Signed)
Physical Therapy Assessment and Plan  Patient Details  Name: Alicia Guerrero MRN: [id number] Date of Birth: [birth year]  PT Diagnosis: Abnormal posture, Abnormality of gait, Cognitive deficits, Difficulty walking, Edema, Hemiplegia dominant, Hypotonia, Impaired cognition, Impaired sensation, Muscle weakness, and Pain in sacrum Rehab Potential: Fair ELOS: ~ 4 weeks   Today's Date: 12/23/2020 PT Individual Time: 1305-1410 PT Individual Time Calculation (min): 65 min    Hospital Problem: Principal Problem:   Stroke (cerebrum) ([place]) Active Problems:   Pressure injury of skin   Past Medical History:  Past Medical History:  Diagnosis Date   Allergic rhinitis due to pollen    Asthma    Atrial fibrillation ([place])    Biventricular ICD (implantable cardiac defibrillator) in place    St Jude 10/2011 (implant MD: Allred)   Bradycardia    a. coreg tapered off 05/2011 => b. Metoprolol restarted after cardiac arrest 09/2011   CAD (coronary artery disease)    a. s/p stent to RCA 1996;  b. s/p cutting balloon PTCA to LAD 2001; c. s/p Cypher DES to LAD 2003;  d. NSTEMI 4/12: DES to dRCA;  e.  [place] 9/13 (after presentation with cardiac arrest and + Nz's for NSTEMI): pLAD 30% ISR, oD1 30%, dLAD 60-70%, pOM1 (small vessel) 90%, oOM2 95% (moderate to large vessel), mCFX 30%, pOM3 30-40%, dRCA stents and pPDA stents patent with 30% ISR => med Rx rec.    Cancer [facility])    Cardiac arrest ([place]) 09/2011   a. VT/VF in community; resusc unsuccessful;  PEA in ED; multiple defibs => revived;  [employer];  c/b by VDRF, cardiogenic shock;  LHC with stable anatomy => med Rx;  difficult ween from vent => s/p trach;  d/c to SNF   Chronic eczema    hands   Chronic systolic heart failure (HCC)    Hemorrhoids    HLD (hyperlipidemia)    HTN (hypertension)    Ischemic cardiomyopathy    a. EF 40% at cath 4/12 with AL and Inf HK;  b.  Echo after presentation with cardiac arrest 9/13:  EF 40-45% and severe inf HK to AK  c/w infarction, PASP 42   Left bundle branch block    Other and unspecified ovarian cyst    Other atopic dermatitis and related conditions    Personal history of hyperthyroidism    Personal history of malignant neoplasm of breast    Past Surgical History:  Past Surgical History:  Procedure Laterality Date   ABDOMINAL HYSTERECTOMY     BI-VENTRICULAR IMPLANTABLE CARDIOVERTER DEFIBRILLATOR N/A 11/02/2011   Procedure: BI-VENTRICULAR IMPLANTABLE CARDIOVERTER DEFIBRILLATOR  (CRT-D);  Surgeon: Thompson Grayer, MD;  Location[facility] C[facility] LAB;  Service: Cardiovascular;  Laterality: N/A;   BIV ICD GENERATOR CHANGEOUT N/A 05/11/2017   Procedure: BIV ICD GENERATOR CHANGEOUT;  Surgeon: [name], MD;  Location: [place] CV LAB;  Service: Cardiovascular;  Laterality: N/A;   BREAST LUMPECTOMY  08/24/2003   right lumpectomy+sln,T2N0,ERPR+,Her2-   CHOLECYSTECTOMY     COLONOSCOPY     CORONARY STENT PLACEMENT  may and  july 2012   LEFT HEART CATHETERIZATION WITH CORONARY ANGIOGRAM N/A 10/09/2011   Procedure: LEFT HEART CATHETERIZATION WITH CORONARY ANGIOGRAM;  Surgeon: Larey Dresser, MD;  Location: [facility] CATH LAB;  Service: Cardiovascular;  Laterality: N/A;   OOPHORECTOMY     POLYPECTOMY     PTCA     VIDEO BRONCHOSCOPY  10/26/2011   Procedure: VIDEO BRONCHOSCOPY WITHOUT FLUORO;  Surgeon: Raylene Miyamoto, MD;  Location: [facility] ENDOSCOPY;  Service: Endoscopy;  Laterality: Bilateral;    Assessment & Plan Clinical Impression: Patient is a 85 y.o. LH-female with history of ICM w/chronic systolic CHF s/p ICD, A fib, prior CVA with mild L-HP who was admitted to [facility] on 12/17/20 with increase in left sided weakness and slurred speech. CT head negative and neurology felt that patient with posterior circulation stroke likely cardioembolic v/s small vessel disease. MRI not done due to ICD. CTA head showed no LVO and severe stenosis L-MCA M1 segment and short segment distal R-PCA P2 segment. 2 D echo showed LVEF  hypokinesis of mid/distal lateral, apical, distal anterior walls with EF 40% --new changes c/w prior echo and aortic sclerosis.    Low dose ASA added by Dr. Hortense Ramal. MBS done revealing oropharyngeal dysphagia with aspiration of thins and patient. She continues to be limited by left sided weakness with dysarthria, loss of balance to there right at EOB and attempts at standing with knees blocked and multimodal cues. CIR recommended due to functional decline. Patient transferred to CIR on 12/22/2020 .   Patient currently requires  +2 total assist  with mobility secondary to muscle weakness and muscle paralysis, decreased cardiorespiratoy endurance, impaired timing and sequencing, abnormal tone, unbalanced muscle activation, decreased coordination, and decreased motor planning, decreased midline orientation and decreased attention to left, decreased initiation, decreased attention, decreased awareness, decreased problem solving, decreased safety awareness, decreased memory, and delayed processing, and decreased sitting balance, decreased standing balance, decreased postural control, hemiplegia, and decreased balance strategies.  Prior to hospitalization, patient was independent  with mobility and lived with Spouse (husband, Jeneen Rinks) in a House home.  Home access is 3Stairs to enter, Other (comment) (family is open to building a ramp).  Patient will benefit from skilled PT intervention to maximize safe functional mobility, minimize fall risk, and decrease caregiver burden for planned discharge home with 24 hour assist.  Anticipate patient will benefit from follow up [facility] at discharge.  PT - End of Session Activity Tolerance: Tolerates 30+ min activity with multiple rests Endurance Deficit: Yes Endurance Deficit Description: Pt falling asleep during session and ultimately had to assist pt back to sidelying PT Assessment Rehab Potential (ACUTE/IP ONLY): Fair PT Barriers to Discharge: Decreased caregiver support;Home  environment access/layout;Nutrition means PT Patient demonstrates impairments in the following area(s): Balance;Perception;Safety;Behavior;Edema;Sensory;Endurance;Skin Integrity;Motor;Nutrition;Pain PT Transfers Functional Problem(s): Bed Mobility;Bed to Chair;Car;Furniture PT Locomotion Functional Problem(s): Ambulation;Wheelchair Mobility;Stairs PT Plan PT Intensity: Minimum of 1-2 x/day ,45 to 90 minutes PT Frequency: 5 out of 7 days PT Duration Estimated Length of Stay: ~ 4 weeks PT Treatment/Interventions: Ambulation/gait training;Community reintegration;DME/adaptive equipment instruction;Neuromuscular re-education;Psychosocial support;Stair training;UE/LE Strength taining/ROM;Wheelchair propulsion/positioning;Balance/vestibular training;Discharge planning;Functional electrical stimulation;Pain management;Skin care/wound management;Therapeutic Activities;UE/LE Coordination activities;Cognitive remediation/compensation;Disease management/prevention;Functional mobility training;Patient/family education;Splinting/orthotics;Therapeutic Exercise;Visual/perceptual remediation/compensation PT Transfers Anticipated Outcome(s): min assist using LRAD PT Locomotion Anticipated Outcome(s): min assist using LRAD limited distances PT Recommendation Follow Up Recommendations: Home health PT;24 hour supervision/assistance Patient destination: Home Equipment Recommended: To be determined   PT Evaluation Precautions/Restrictions Precautions Precautions: Fall;Other (comment) Precaution Comments: LUE hemiplegia Restrictions Weight Bearing Restrictions: No Pain Pain Assessment Pain Scale: Faces Pain Score:  (pt states "Just my butt") Faces Pain Scale: Hurts even more Pain Type: Acute pain Pain Location: Buttocks Pain Orientation: Mid Pain Onset: On-going Pain Intervention(s): Repositioned;RN made aware Pain Interference Pain Interference Pain Effect on Sleep: 1. Rarely or not at all Pain  Interference with Therapy Activities: 1. Rarely or not at all Pain Interference with Day-to-Day Activities: 1. Rarely or not at  all Home Living/Prior Functioning Home Living Living Arrangements: Spouse/significant other Available Help at Discharge: Family (pt's son, Karma Greaser, lives next door - assist with all bills, medications, and shopping - pt does not leave house - Karma Greaser is on disability so is available more readily but otherwise limited assistance from his wife, brother & sister-in-law) Type of Home: House Home Access: Stairs to enter;Other (comment) (family is open to building a ramp) Technical brewer of Steps: 3 Entrance Stairs-Rails: None Home Layout: One level  Lives With: Spouse (husband, Jeneen Rinks) Prior Function Level of Independence: Independent with gait;Independent with transfers (was able to do simple household tasks such as laundry & dishes)  Able to Take Stairs?: Yes Vision/Perception  Vision - History Ability to See in Adequate Light: 0 Adequate Perception Perception: Impaired Inattention/Neglect: Does not attend to left visual field;Impaired-to be further tested in functional context Praxis Praxis: Impaired (to be further assessed) Praxis Impairment Details: Motor planning  Cognition  Overall Cognitive Status: Impaired/Different from baseline (pt with significantly delayed response to questions with slow speech production) Arousal/Alertness: Lethargic Orientation Level: Oriented to person;Oriented to place;Disoriented to situation (able to state at "[place]" but then states here "to get fixed up my broken bones" - therapist reoriented her to situation) Year: 2022 Month: July Day of Week: Incorrect Attention: Focused;Sustained Focused Attention: Impaired Sustained Attention: Impaired Memory: Impaired Awareness: Impaired Safety/Judgment: Impaired Sensation Sensation Light Touch: Impaired by gross assessment (unable to formally assess due to pt lethargy  but appears grossly impaired on L hemibody) Light Touch Impaired Details: Impaired LLE;Impaired LUE Hot/Cold: Not tested Proprioception: Impaired by gross assessment (unable to formally assess due to pt lethargy but appears grossly impaired on L hemibody) Proprioception Impaired Details: Impaired LLE;Impaired LUE Stereognosis: Not tested Additional Comments: Pt unable to accurately state where therapist was touching her on the left arm/hand and was also inaccurate with reporting which direction her wrist was being moved by therapist. Coordination Gross Motor Movements are Fluid and Coordinated: No Fine Motor Movements are Fluid and Coordinated: No Coordination and Movement Description: GM movements significantly impaired due to L hemiplegia, impaired trunk control, along with generalized weakness and deconditioning Heel Shin Test: due to lethargy unable to assess on either LE but also lacks adequate strength to perform with L LE Motor  Motor Motor: Hemiplegia;Abnormal postural alignment and control Motor - Skilled Clinical Observations: LUE and LLE hemiplegia   Trunk/Postural Assessment  Cervical Assessment Cervical Assessment: Exceptions to [facility] (cervical protraction and flexion) Thoracic Assessment Thoracic Assessment: Exceptions to [facility] (thoracic kyphosis) Lumbar Assessment Lumbar Assessment: Exceptions to [facility] (posterior pelvic tilt with increased lumbar flexion in sitting) Postural Control Postural Control: Deficits on evaluation Head Control: flexed head Trunk Control: only tolerated sitting EOB for <69mnutes with pt requiring max progressed to dependent assist for trunk control due to R posterior lean from falling asleep Righting Reactions: delayed  Balance Balance Balance Assessed: Yes Static Sitting Balance Static Sitting - Balance Support: Feet supported Static Sitting - Level of Assistance: 1: +1 Total assist Static Sitting - Comment/# of Minutes: <2 minutes Extremity  Assessment      RLE Assessment RLE Assessment: Exceptions to [facility] Range of Motion (AROM) Comments: appears to be [facility]General Strength Comments: generalized weakness and deconditioning - pt with poor participation in assessment due to lethargy RLE Strength Right Hip Flexion: 3/5 Right Hip Extension: 3-/5 Right Knee Flexion: 3-/5 Right Knee Extension: 3-/5 Right Ankle Dorsiflexion: 3/5 Right Ankle Plantar Flexion: 3/5 LLE Assessment LLE Assessment: Exceptions to [facility]  Active Range of Motion (AROM) Comments: appears to be [facility] General Strength Comments: poor participation in strength assessment due to lethargy LLE Strength Left Hip Flexion: 2-/5 Left Hip Extension: 2-/5 Left Knee Flexion: 2-/5 Left Knee Extension: 2-/5 Left Ankle Dorsiflexion: 1/5 Left Ankle Plantar Flexion: 1/5  Care Tool Care Tool Bed Mobility Roll left and right activity   Roll left and right assist level: Total Assistance - Patient < 25% (using bed pads)    Sit to lying activity   Sit to lying assist level: 2 Helpers (using bed pads)    Lying to sitting on side of bed activity   Lying to sitting on side of bed assist level: the ability to move from lying on the back to sitting on the side of the bed with no back support.: Total Assistance - Patient < 25% (using bed pads)     Care Tool Transfers Sit to stand transfer Sit to stand activity did not occur: Safety/medical concerns     Chair/bed transfer Chair/bed transfer activity did not occur: Safety/medical concerns       Toilet transfer Toilet transfer activity did not occur: Safety/medical concerns      Scientist, product/process development transfer activity did not occur: Safety/medical concerns        Care Tool Locomotion Ambulation Ambulation activity did not occur: Safety/medical concerns        Walk 10 feet activity Walk 10 feet activity did not occur: Safety/medical concerns       Walk 50 feet with 2 turns activity Walk 50 feet with 2 turns activity  did not occur: Safety/medical concerns      Walk 150 feet activity Walk 150 feet activity did not occur: Safety/medical concerns      Walk 10 feet on uneven surfaces activity Walk 10 feet on uneven surfaces activity did not occur: Safety/medical concerns      Stairs Stair activity did not occur: Safety/medical concerns        Walk up/down 1 step activity Walk up/down 1 step or curb (drop down) activity did not occur: Safety/medical concerns      Walk up/down 4 steps activity Walk up/down 4 steps activity did not occur: Safety/medical concerns      Walk up/down 12 steps activity Walk up/down 12 steps activity did not occur: Safety/medical concerns      Pick up small objects from floor Pick up small object from the floor (from standing position) activity did not occur: Safety/medical concerns      Wheelchair Is the patient using a wheelchair?: No          Wheel 50 feet with 2 turns activity      Wheel 150 feet activity        Refer to Care Plan for Long Term Goals  SHORT TERM GOAL WEEK 1 PT Short Term Goal 1 (Week 1): Pt will perform supine<>sit with max assist PT Short Term Goal 2 (Week 1): Pt will tolerate upright sitting on EOB/EOM for at least 2 minutes with no more than mod assist PT Short Term Goal 3 (Week 1): Pt will perform bed<>chair transfers with +2 max assist using  LRAD PT Short Term Goal 4 (Week 1): Pt will perform sit<>stands using LRAD with +2 max assist  Recommendations for other services: None   Skilled Therapeutic Intervention Pt received supine in bed with her son, [name], present and them both reporting pt is "extremely exhausted;" however, pt agreeable to therapy session. Evaluation completed (see details above) with  patient/family education regarding purpose of PT evaluation, PT POC and goals, therapy schedule, weekly team meetings, and other CIR information including safety plan and fall risk safety. Pt remained lethargic throughout eval frequently  falling asleep. Rolling R/L in bed requiring use of bed pads to rotate trunk and hips with total assist then transitioned supine>sidelying>sitting R EOB with total assist via use of bed pads for B LE management off EOB and trunk upright. Pt tolerated sitting EOB <11mnutes with max progressed to dependent assist for trunk control due to progressively worsening R posterior lean because pt had fallen asleep. Provided +2 total assist to return to supine. Therapeutically repositioned pt in L sidelying to promote proprioceptive input into L hemibody (positioned with L scapula protracted for joint integrity) and for pressure relief of her sacrum - educated family on importance of assisting pt with rolling every 2 hours for pressure relief (sign placed above HOB for nursing staff) and educated family on ensuring L UE elevated for edema management. Therapist provided pt with TIS wheelchair with pressure relieving cushion to be utilized in future therapy session to promote increased, upright OOB activity tolerance while allowing pressure relief. Pt left L sidelying in bed with needs in reach, bed alarm on, and nursing staff aware of pt's position.  Mobility Bed Mobility Bed Mobility: Rolling Right;Rolling Left;Sit to Supine;Supine to Sit Rolling Right: Total Assistance - Patient < 25% Rolling Left: Total Assistance - Patient < 25% Supine to Sit: Total Assistance - Patient < 25% Sit to Supine: 2 Helpers;Total Assistance - Patient < 25% Transfers Transfers:  (unable to advance past sitting EOB due to lethargy) Locomotion  Gait Ambulation: No Gait Gait: No Stairs / Additional Locomotion Stairs: No Wheelchair Mobility Wheelchair Mobility: No   Discharge Criteria: Patient will be discharged from PT if patient refuses treatment 3 consecutive times without medical reason, if treatment goals not met, if there is a change in medical status, if patient makes no progress towards goals or if patient is discharged  from hospital.  The above assessment, treatment plan, treatment alternatives and goals were discussed and mutually agreed upon: by patient and by family  CTawana Scale, PT, DPT, NCS, CSRS 12/23/2020, 12:42 PM

## 2020-12-23 NOTE — Plan of Care (Signed)
  Problem: RH Balance Goal: LTG Patient will maintain dynamic sitting balance (PT) Description: LTG:  Patient will maintain dynamic sitting balance with assistance during mobility activities (PT) Flowsheets (Taken 12/23/2020 1946) LTG: Pt will maintain dynamic sitting balance during mobility activities with:: Contact Guard/Touching assist Goal: LTG Patient will maintain dynamic standing balance (PT) Description: LTG:  Patient will maintain dynamic standing balance with assistance during mobility activities (PT) Flowsheets (Taken 12/23/2020 1946) LTG: Pt will maintain dynamic standing balance during mobility activities with:: Minimal Assistance - Patient > 75%   Problem: Sit to Stand Goal: LTG:  Patient will perform sit to stand with assistance level (PT) Description: LTG:  Patient will perform sit to stand with assistance level (PT) Flowsheets (Taken 12/23/2020 1946) LTG: PT will perform sit to stand in preparation for functional mobility with assistance level: Minimal Assistance - Patient > 75%   Problem: RH Bed Mobility Goal: LTG Patient will perform bed mobility with assist (PT) Description: LTG: Patient will perform bed mobility with assistance, with/without cues (PT). Flowsheets (Taken 12/23/2020 1946) LTG: Pt will perform bed mobility with assistance level of: Minimal Assistance - Patient > 75%   Problem: RH Bed to Chair Transfers Goal: LTG Patient will perform bed/chair transfers w/assist (PT) Description: LTG: Patient will perform bed to chair transfers with assistance (PT). Flowsheets (Taken 12/23/2020 1946) LTG: Pt will perform Bed to Chair Transfers with assistance level: Minimal Assistance - Patient > 75%   Problem: RH Car Transfers Goal: LTG Patient will perform car transfers with assist (PT) Description: LTG: Patient will perform car transfers with assistance (PT). Flowsheets (Taken 12/23/2020 1946) LTG: Pt will perform car transfers with assist:: Minimal Assistance - Patient  > 75%   Problem: RH Ambulation Goal: LTG Patient will ambulate in controlled environment (PT) Description: LTG: Patient will ambulate in a controlled environment, # of feet with assistance (PT). Flowsheets (Taken 12/23/2020 1946) LTG: Pt will ambulate in controlled environ  assist needed:: Minimal Assistance - Patient > 75% LTG: Ambulation distance in controlled environment: 57ft using LRAD Goal: LTG Patient will ambulate in home environment (PT) Description: LTG: Patient will ambulate in home environment, # of feet with assistance (PT). Flowsheets (Taken 12/23/2020 1946) LTG: Pt will ambulate in home environ  assist needed:: Minimal Assistance - Patient > 75% LTG: Ambulation distance in home environment: 18ft using LRAD   Problem: RH Wheelchair Mobility Goal: LTG Patient will propel w/c in controlled environment (PT) Description: LTG: Patient will propel wheelchair in controlled environment, # of feet with assist (PT) Flowsheets (Taken 12/23/2020 1946) LTG: Pt will propel w/c in controlled environ  assist needed:: Supervision/Verbal cueing LTG: Propel w/c distance in controlled environment: 23ft Goal: LTG Patient will propel w/c in home environment (PT) Description: LTG: Patient will propel wheelchair in home environment, # of feet with assistance (PT). Flowsheets (Taken 12/23/2020 1946) LTG: Pt will propel w/c in home environ  assist needed:: Supervision/Verbal cueing LTG: Propel w/c distance in home environment: 53ft

## 2020-12-23 NOTE — Evaluation (Signed)
Speech Language Pathology Assessment and Plan  Patient Details  Name: Alicia Guerrero MRN: [id number] Date of Birth: December 10, 1935  SLP Diagnosis: Cognitive Impairments;Dysarthria;Dysphagia  Rehab Potential: Fair ELOS: ~3.5 weeks   Today's Date: 12/23/2020 SLP Individual Time: 0800-0900 SLP Individual Time Calculation (min): 60 min  Hospital Problem: Principal Problem:   Stroke (cerebrum) ([place]) Active Problems:   Pressure injury of skin  Past Medical History:  Past Medical History:  Diagnosis Date   Allergic rhinitis due to pollen    Asthma    Atrial fibrillation ([place])    Biventricular ICD (implantable cardiac defibrillator) in place    St Jude 10/2011 (implant MD: Allred)   Bradycardia    a. coreg tapered off 05/2011 => b. Metoprolol restarted after cardiac arrest 09/2011   CAD (coronary artery disease)    a. s/p stent to RCA 1996;  b. s/p cutting balloon PTCA to LAD 2001; c. s/p Cypher DES to LAD 2003;  d. NSTEMI 4/12: DES to dRCA;  e.  [place] 9/13 (after presentation with cardiac arrest and + Nz's for NSTEMI): pLAD 30% ISR, oD1 30%, dLAD 60-70%, pOM1 (small vessel) 90%, oOM2 95% (moderate to large vessel), mCFX 30%, pOM3 30-40%, dRCA stents and pPDA stents patent with 30% ISR => med Rx rec.    [facility])    Cardiac arrest ([place]) 09/2011   a. VT/VF in community; resusc unsuccessful;  PEA in ED; multiple defibs => revived;  [employer];  c/b by VDRF, cardiogenic shock;  LHC with stable anatomy => med Rx;  difficult ween from vent => s/p trach;  d/c to SNF   Chronic eczema    hands   Chronic systolic heart failure (H[facility])    Hemorrhoids    HLD (hyperlipidemia)    HTN (hypertension)    Ischemic cardiomyopathy    a. EF 40% at cath 4/12 with AL and Inf HK;  b.  Echo after presentation with cardiac arrest 9/13:  EF 40-45% and severe inf HK to AK c/w infarction, PASP 42   Left bundle branch block    Other and unspecified ovarian cyst    Other atopic dermatitis and related  conditions    Personal history of hyperthyroidism    Personal history of malignant neoplasm of breast    Past Surgical History:  Past Surgical History:  Procedure Laterality Date   ABDOMINAL HYSTERECTOMY     BI-VENTRICULAR IMPLANTABLE CARDIOVERTER DEFIBRILLATOR N/A 11/02/2011   Procedure: BI-VENTRICULAR IMPLANTABLE CARDIOVERTER DEFIBRILLATOR  (CRT-D);  Surgeon: Thompson Grayer, MD;  Location: [facility] CATH LAB;  Service: Cardiovascular;  Laterality: N/A;   BIV ICD GENERATOR CHANGEOUT N/A 05/11/2017   Procedure: BIV ICD GENERATOR CHANGEOUT;  Surgeon: [name], MD;  Location: [place] CV LAB;  Service: Cardiovascular;  Laterality: N/A;   BREAST LUMPECTOMY  08/24/2003   right lumpectomy+sln,T2N0,ERPR+,Her2-   CHOLECYSTECTOMY     COLONOSCOPY     CORONARY STENT PLACEMENT  may and  july 2012   LEFT HEART CATHETERIZATION WITH CORONARY ANGIOGRAM N/A 10/09/2011   Procedure: LEFT HEART CATHETERIZATION WITH CORONARY ANGIOGRAM;  Surgeon: Larey Dresser, MD;  Location: [facility] CATH LAB;  Service: Cardiovascular;  Laterality: N/A;   OOPHORECTOMY     POLYPECTOMY     PTCA     VIDEO BRONCHOSCOPY  10/26/2011   Procedure: VIDEO BRONCHOSCOPY WITHOUT FLUORO;  Surgeon: Raylene Miyamoto, MD;  Location: [facility] ENDOSCOPY;  Service: Endoscopy;  Laterality: Bilateral;    Assessment / Plan / Recommendation Clinical Impression Alicia Guerrero is  an 89 year LH-female with history of ICM w/chronic systolic CHF s/p ICD, A fib, prior CVA with mild L-HP who was admitted to [facility] on 12/17/20 with increase in left sided weakness and slurred speech. CT head negative and neurology felt that patient with posterior circulation stroke likely cardioembolic v/s small vessel disease. MRI not done due to ICD. CTA head showed no LVO and severe stenosis L-MCA M1 segment and short segment distal R-PCA P2 segment. MBS done revealing oropharyngeal dysphagia with aspiration of thins. She continues to be limited by left sided weakness with dysarthria,  loss of balance to there right at EOB and attempts at standing with knees blocked and multimodal cues. CIR recommended due to functional decline. Currently complaints of cough.   Patient presents with significant cognitive-linguistic deficits characterized by impaired orientation, focused attention, intellectual awareness, basic problem solving skills, and short-term recall. Patient was oriented to person only and felt as though she was in a school, the month was February, and year as "2020." She was only able to recall 50% of this information after short-term delay (5 minutes) after each component was discussed in detail. Patient exhibited drowsiness and fell asleep intermittently throughout session. She exhibited significant difficulty with verbal problem solving with impaired reasoning, judgement, and awareness (e.g., "what would you do if you needed to use the bathroom?" Pt: "put this (paper) on the door" and proceeded to kiss the paper). Auditory comprehension of simple questions and commands appeared mildly impaired and complicated by decreased focused and sustained attention resulting in need for repeated instruction. Patient's overall verbal expression appeared [facility] for all tasks assessed.Pt presented with significant dysarthria characterized by reduced vocal intensity and decreased articulatory precision resulting in ~50-75% intelligibility at sentence level which would have been less in a nosier environment. Per PT/OT/ST acute care documentation, pt presented with less severity overall from a cognitive standpoint. Discrepancies in pt's performance in acute care as compared to functional deficits noted today were communicated to therapy team and Dr. Letta Pate via secure Epic message. Patient would benefit from skilled SLP intervention to maximize her cognitive functioning and overall functional independence prior to discharge.   Clinical swallow assessment completed with dysphagia 1 textures and nectar  thick liquid trials by cup and representative of oropharyngeal dysphagia characterized by left anterior labial spillage, delayed AP transit, lingual pumping, and suspected delayed swallow initiation. It should be noted pt exhibited wet cough at baseline without presentation of PO intake. Pt did not exhibit overt s/sx of aspiration with all trialed consistencies. Recommend continuation of current diet, crushed meds, and full supervision for implementation of safe swallow techniques.  Patient would benefit from skilled SLP intervention to maximize her cognitive, speech, and cognitive functioning, functional independence, and decrease burden of care.    Skilled Therapeutic Interventions          Pt participated in evaluation of cognitive-linguistic, speech, language , and swallow function. Please see above.    SLP Assessment  Patient will need skilled Speech Lanaguage Pathology Services during CIR admission    Recommendations  SLP Diet Recommendations: Nectar;Dysphagia 1 (Puree) Liquid Administration via: Cup Medication Administration: Crushed with puree Supervision: Full supervision/cueing for compensatory strategies Compensations: Slow rate;Small sips/bites;Minimize environmental distractions;Lingual sweep for clearance of pocketing;Multiple dry swallows after each bite/sip;Effortful swallow Postural Changes and/or Swallow Maneuvers: Seated upright 90 degrees;Upright 30-60 min after meal Oral Care Recommendations: Oral care BID Patient destination: Home Follow up Recommendations: 24 hour supervision/assistance;Home Health SLP Equipment Recommended: None recommended by SLP  SLP Frequency 3 to 5 out of 7 days   SLP Duration  SLP Intensity  SLP Treatment/Interventions ~3.5 weeks  Minumum of 1-2 x/day, 30 to 90 minutes  Cognitive remediation/compensation;Cueing hierarchy;Dysphagia/aspiration precaution training;Environmental controls;Functional tasks;Patient/family education;Therapeutic  Activities    Pain Pain Assessment Pain Scale: Faces Pain Score:  (pt states "Just my butt") Faces Pain Scale: Hurts even more Pain Type: Acute pain Pain Location: Buttocks Pain Orientation: Mid Pain Onset: On-going Pain Intervention(s): Repositioned;RN made aware  Prior Functioning Type of Home: House  Lives With: Spouse (husband, Jeneen Rinks) Available Help at Discharge: Family (pt's son, Karma Greaser, lives next door - assist with all bills, medications, and shopping - pt does not leave house - [name] is on disability so is available more readily but otherwise limited assistance from his wife, brother & sister-in-law) Education: high school  SLP Evaluation Cognition Overall Cognitive Status: Impaired/Different from baseline (pt with significantly delayed response to questions with slow speech production) Arousal/Alertness: Lethargic Orientation Level: Oriented to person;Oriented to place;Disoriented to situation (able to state at "[place]" but then states here "to get fixed up my broken bones" - therapist reoriented her to situation) Year: 2022 Month: July Day of Week: Incorrect Attention: Focused;Sustained Focused Attention: Impaired Sustained Attention: Impaired Memory: Impaired Awareness: Impaired Safety/Judgment: Impaired  Comprehension   Expression Expression Primary Mode of Expression: Verbal Verbal Expression Overall Verbal Expression: Appears within functional limits for tasks assessed Interfering Components: Attention;Speech intelligibility Written Expression Dominant Hand: Left Written Expression: Unable to assess (comment) (dominant hand affected; lethargy) Oral Motor Oral Motor/Sensory Function Overall Oral Motor/Sensory Function: Mild impairment Facial ROM: Within Functional Limits Facial Symmetry: Abnormal symmetry left Lingual ROM: Within Functional Limits Lingual Symmetry: Abnormal symmetry left Lingual Strength: Reduced Motor Speech Overall Motor  Speech: Impaired Respiration: Impaired Level of Impairment: Sentence Phonation: Low vocal intensity Resonance: Hypernasality Articulation: Impaired Level of Impairment: Phrase Intelligibility: Intelligibility reduced Word: 75-100% accurate Phrase: 50-74% accurate Sentence: 25-49% accurate Conversation: 25-49% accurate Motor Speech Errors: Not applicable Effective Techniques: Slow rate;Increased vocal intensity;Over-articulate  Care Tool Care Tool Cognition Ability to hear (with hearing aid or hearing appliances if normally used Ability to hear (with hearing aid or hearing appliances if normally used): 0. Adequate - no difficulty in normal conservation, social interaction, listening to TV   Expression of Ideas and Wants Expression of Ideas and Wants: 3. Some difficulty - exhibits some difficulty with expressing needs and ideas (e.g, some words or finishing thoughts) or speech is not clear   Understanding Verbal and Non-Verbal Content Understanding Verbal and Non-Verbal Content: 3. Usually understands - understands most conversations, but misses some part/intent of message. Requires cues at times to understand  Memory/Recall Ability Memory/Recall Ability : None of the above were recalled   PMSV Assessment  PMSV Trial Intelligibility: Intelligibility reduced Word: 75-100% accurate Phrase: 50-74% accurate Sentence: 25-49% accurate Conversation: 25-49% accurate  Bedside Swallowing Assessment General Date of Onset: 12/16/20 Previous Swallow Assessment: MBS completed 12/17/20 Diet Prior to this Study: Dysphagia 1 (puree);Nectar-thick liquids Temperature Spikes Noted: No Respiratory Status: Room air History of Recent Intubation: No Behavior/Cognition: Cooperative;Pleasant mood;Confused Oral Cavity - Dentition: Dentures, bottom;Dentures, top;Edentulous Self-Feeding Abilities: Able to feed self;Needs assist Vision: Functional for self-feeding Patient Positioning: Upright in  bed Baseline Vocal Quality: Low vocal intensity Volitional Cough: Strong Volitional Swallow: Able to elicit  Oral Care Assessment Does patient have any of the following "high(er) risk" factors?: None of the above Does patient have any of the following "at risk" factors?: Diet - patient on  thickened liquids;Other - dysphagia Patient is AT RISK: Order set for Adult Oral Care Protocol initiated -  "At Risk Patients" option selected (see row information) Ice Chips Ice chips: Not tested Thin Liquid Thin Liquid: Not tested Nectar Thick Nectar Thick Liquid: Impaired Oral phase functional implications: Prolonged oral transit Pharyngeal Phase Impairments: Suspected delayed Swallow;Multiple swallows;Throat Clearing - Delayed Honey Thick Honey Thick Liquid: Not tested Puree Puree: Impaired Presentation: Spoon Oral Phase Impairments: Reduced lingual movement/coordination Oral Phase Functional Implications: Prolonged oral transit Pharyngeal Phase Impairments: Suspected delayed Swallow;Multiple swallows Solid Solid: Not tested BSE Assessment Risk for Aspiration Impact on safety and function: Mild aspiration risk;Moderate aspiration risk Other Related Risk Factors: Previous CVA;Cognitive impairment;Lethargy  Short Term Goals: Week 1: SLP Short Term Goal 1 (Week 1): Patient will use external aids to orient to person/place/time/situation with mod A verbal cues SLP Short Term Goal 2 (Week 1): Patient will perform basic and familiar tasks for 5 minute intervals with mod A verbal cues for redirection SLP Short Term Goal 3 (Week 1): Patient will complete basic, familiar tasks with mod A verbal cues for functional problem solving SLP Short Term Goal 4 (Week 1): Patient will utilize speech intelligiblity strategies at sentence level to achieve 90% accuracy with mod A verbal cues SLP Short Term Goal 5 (Week 1): Patient will consume current diet with improved oral control and without overt s/sx of  aspiration with min A verbal cues  Refer to Care Plan for Long Term Goals  Recommendations for other services: None   Discharge Criteria: Patient will be discharged from SLP if patient refuses treatment 3 consecutive times without medical reason, if treatment goals not met, if there is a change in medical status, if patient makes no progress towards goals or if patient is discharged from hospital.  The above assessment, treatment plan, treatment alternatives and goals were discussed and mutually agreed upon: by patient  Patty Sermons 12/23/2020, 4:17 PM

## 2020-12-23 NOTE — Evaluation (Signed)
Occupational Therapy Assessment and Plan  Patient Details  Name: Alicia Guerrero MRN: [id number] Date of Birth: 1935-09-11  OT Diagnosis: abnormal posture, acute pain, altered mental status, apraxia, cognitive deficits, disturbance of vision, hemiplegia affecting dominant side, and muscle weakness (generalized) Rehab Potential: Rehab Potential (ACUTE ONLY): Good ELOS: 23-27 days   Today's Date: 12/23/2020 OT Individual Time: 0906-1000 OT Individual Time Calculation (min): 54 min     Hospital Problem: Principal Problem:   Stroke (cerebrum) ([place]) Active Problems:   Pressure injury of skin   Past Medical History:  Past Medical History:  Diagnosis Date   Allergic rhinitis due to pollen    Asthma    Atrial fibrillation ([place])    Biventricular ICD (implantable cardiac defibrillator) in place    St Jude 10/2011 (implant MD: Allred)   Bradycardia    a. coreg tapered off 05/2011 => b. Metoprolol restarted after cardiac arrest 09/2011   CAD (coronary artery disease)    a. s/p stent to RCA 1996;  b. s/p cutting balloon PTCA to LAD 2001; c. s/p Cypher DES to LAD 2003;  d. NSTEMI 4/12: DES to dRCA;  e.  [place] 9/13 (after presentation with cardiac arrest and + Nz's for NSTEMI): pLAD 30% ISR, oD1 30%, dLAD 60-70%, pOM1 (small vessel) 90%, oOM2 95% (moderate to large vessel), mCFX 30%, pOM3 30-40%, dRCA stents and pPDA stents patent with 30% ISR => med Rx rec.    [facility])    Cardiac arrest ([place]) 09/2011   a. VT/VF in community; resusc unsuccessful;  PEA in ED; multiple defibs => revived;  [employer];  c/b by VDRF, cardiogenic shock;  LHC with stable anatomy => med Rx;  difficult ween from vent => s/p trach;  d/c to SNF   Chronic eczema    hands   Chronic systolic heart failure (HCC)    Hemorrhoids    HLD (hyperlipidemia)    HTN (hypertension)    Ischemic cardiomyopathy    a. EF 40% at cath 4/12 with AL and Inf HK;  b.  Echo after presentation with cardiac arrest 9/13:  EF 40-45% and  severe inf HK to AK c/w infarction, PASP 42   Left bundle branch block    Other and unspecified ovarian cyst    Other atopic dermatitis and related conditions    Personal history of hyperthyroidism    Personal history of malignant neoplasm of breast    Past Surgical History:  Past Surgical History:  Procedure Laterality Date   ABDOMINAL HYSTERECTOMY     BI-VENTRICULAR IMPLANTABLE CARDIOVERTER DEFIBRILLATOR N/A 11/02/2011   Procedure: BI-VENTRICULAR IMPLANTABLE CARDIOVERTER DEFIBRILLATOR  (CRT-D);  Surgeon: Thompson Grayer, MD;  Location: [facility] CATH LAB;  Service: Cardiovascular;  Laterality: N/A;   BIV ICD GENERATOR CHANGEOUT N/A 05/11/2017   Procedure: BIV ICD GENERATOR CHANGEOUT;  Surgeon: [name], MD;  Location: [facility] CV LAB;  Service: Cardiovascular;  Laterality: N/A;   BREAST LUMPECTOMY  08/24/2003   right lumpectomy+sln,T2N0,ERPR+,Her2-   CHOLECYSTECTOMY     COLONOSCOPY     CORONARY STENT PLACEMENT  may and  july 2012   LEFT HEART CATHETERIZATION WITH CORONARY ANGIOGRAM N/A 10/09/2011   Procedure: LEFT HEART CATHETERIZATION WITH CORONARY ANGIOGRAM;  Surgeon: Larey Dresser, MD;  Location: [facility] CATH LAB;  Service: Cardiovascular;  Laterality: N/A;   OOPHORECTOMY     POLYPECTOMY     PTCA     VIDEO BRONCHOSCOPY  10/26/2011   Procedure: VIDEO BRONCHOSCOPY WITHOUT FLUORO;  Surgeon: Raylene Miyamoto,  MD;  Location: [facility] ENDOSCOPY;  Service: Endoscopy;  Laterality: Bilateral;    Assessment & Plan Clinical Impression: Patient is a 85 y.o. year old female with recent admission to the hospital on 12/17/20 with increase in left sided weakness and slurred speech. CT head negative and neurology felt that patient with posterior circulation stroke likely cardioembolic v/s small vessel disease. MRI not done due to ICD. CTA head showed no LVO and severe stenosis L-MCA M1 segment and short segment distal R-PCA P2 segment. 2 D echo showed LVEF hypokinesis of mid/distal lateral, apical, distal  anterior walls with EF 40% --new changes c/w prior echo and aortic sclerosis.  Patient transferred to CIR on 12/22/2020 .    Patient currently requires total with basic self-care skills secondary to muscle weakness and muscle paralysis, impaired timing and sequencing, unbalanced muscle activation, and decreased coordination, decreased visual perceptual skills, decreased midline orientation, decreased attention to left, left side neglect, and decreased motor planning, decreased attention, decreased awareness, decreased problem solving, decreased safety awareness, decreased memory, and delayed processing, and decreased sitting balance, decreased standing balance, decreased postural control, hemiplegia, and decreased balance strategies.  Prior to hospitalization, patient could complete ADLs with independent .  Patient will benefit from skilled intervention to decrease level of assist with basic self-care skills and increase independence with basic self-care skills prior to discharge home with care partner.  Anticipate patient will require minimal physical assistance and follow up home health.  OT - End of Session Activity Tolerance: Tolerates 10 - 20 min activity with multiple rests Endurance Deficit: Yes Endurance Deficit Description: Pt fatigues quickly in sitting OT Assessment Rehab Potential (ACUTE ONLY): Good OT Patient demonstrates impairments in the following area(s): Balance;Cognition;Perception;Safety;Sensory;Vision;Endurance;Motor;Pain OT Basic ADL's Functional Problem(s): Eating;Grooming;Bathing;Dressing;Toileting OT Transfers Functional Problem(s): Toilet;Tub/Shower OT Additional Impairment(s): Fuctional Use of Upper Extremity OT Plan OT Intensity: Minimum of 1-2 x/day, 45 to 90 minutes OT Frequency: 5 out of 7 days OT Duration/Estimated Length of Stay: 23-27 days OT Treatment/Interventions: Balance/vestibular training;Discharge planning;Pain management;Self Care/advanced ADL  retraining;Therapeutic Activities;UE/LE Coordination activities;Cognitive remediation/compensation;Disease mangement/prevention;Functional mobility training;Patient/family education;Therapeutic Exercise;DME/adaptive equipment instruction;Functional electrical stimulation;Neuromuscular re-education;UE/LE Strength taining/ROM;Splinting/orthotics;Community reintegration;Psychosocial support;Visual/perceptual remediation/compensation;Wheelchair propulsion/positioning OT Self Feeding Anticipated Outcome(s): supervision OT Basic Self-Care Anticipated Outcome(s): min assist level OT Toileting Anticipated Outcome(s): min assist level OT Bathroom Transfers Anticipated Outcome(s): min assist level OT Recommendation Recommendations for Other Services: Therapeutic Recreation consult Therapeutic Recreation Interventions: Stress management Patient destination: Home Follow Up Recommendations: Home health OT;24 hour supervision/assistance Equipment Recommended: To be determined   OT Evaluation Precautions/Restrictions  Precautions Precautions: Fall;Other (comment) Precaution Comments: L side hemiparesis, left neglect Restrictions Weight Bearing Restrictions: No  Pain Pain Assessment Pain Scale: Faces Pain Score:  (pt states "Just my butt") Faces Pain Scale: Hurts even more Pain Type: Acute pain Pain Location: Buttocks Pain Orientation: Mid Pain Onset: On-going Pain Intervention(s): Repositioned;RN made aware Home Living/Prior Functioning Home Living Living Arrangements: Spouse/significant other Available Help at Discharge: Family (pt's son, Karma Greaser, lives next door - assist with all bills, medications, and shopping - pt does not leave house - Karma Greaser is on disability so is available more readily but otherwise limited assistance from his wife, brother & sister-in-law) Type of Home: House Home Access: Stairs to enter, Other (comment) (family is open to building a ramp) Technical brewer of  Steps: 3 Entrance Stairs-Rails: None Home Layout: One level  Lives With: Spouse (husband, Jeneen Rinks) IADL History Homemaking Responsibilities: Yes Meal Prep Responsibility: Primary Laundry Responsibility: Primary Cleaning Responsibility: Primary Current License: No Education: high school  Occupation: Retired Prior Nurse, learning disability of Independence: Independent with gait, Independent with transfers, Independent with basic ADLs, Independent with homemaking with ambulation  Able to Take Stairs?: Yes Vision Baseline Vision/History: 1 Wears glasses Ability to See in Adequate Light: 0 Adequate Patient Visual Report: No change from baseline Vision Assessment?: Yes Eye Alignment: Within Functional Limits Alignment/Gaze Preference: Head turned (head turned to the right) Tracking/Visual Pursuits: Other (comment) (lost fixation with tracking across midline to the left initially and then also to the right secondary to attention level.) Convergence: Within functional limits Visual Fields: Other (comment) (Pt able to grossly detect peripheral field on both sides.  Will examine further as pt delayed with follwing instructions at times for accurate assessment. Possible deficit on the left as pt maintains head turn to the right.) Perception  Perception: Impaired Inattention/Neglect: Does not attend to left visual field Praxis Praxis: Impaired Praxis Impairment Details: Ideomotor Cognition Overall Cognitive Status: Impaired/Different from baseline Arousal/Alertness: Awake/alert Orientation Level: Person;Place;Situation Person: Oriented Place: Disoriented Situation: Oriented Year: 2020 Month: December Day of Week: Incorrect Memory: Impaired Memory Impairment: Storage deficit;Decreased recall of new information Decreased Short Term Memory: Functional basic;Verbal basic Immediate Memory Recall: Sock;Blue;Bed Memory Recall Sock: With Cue Memory Recall Blue: With Cue Memory Recall Bed: Not able to  recall Attention: Focused;Sustained Focused Attention: Impaired Focused Attention Impairment: Verbal basic;Functional basic Sustained Attention: Impaired Sustained Attention Impairment: Functional basic;Verbal basic Awareness: Impaired Awareness Impairment: Intellectual impairment Problem Solving: Impaired Problem Solving Impairment: Verbal basic;Functional basic Safety/Judgment: Impaired Sensation Sensation Light Touch: Impaired Detail Light Touch Impaired Details: Impaired LUE Hot/Cold: Not tested Proprioception: Impaired Detail Proprioception Impaired Details: Impaired LUE Stereognosis: Not tested Additional Comments: Pt unable to accurately state where therapist was touching her on the left arm/hand and was also inaccurate with reporting which direction her wrist was being moved by therapist. Coordination Gross Motor Movements are Fluid and Coordinated: No Fine Motor Movements are Fluid and Coordinated: No Coordination and Movement Description: Brunnstrum stage III movement in the left arm with stage II in the hand.  She needs max hand over hand assist for integration into selfcare tasks. Motor  Motor Motor: Hemiplegia;Abnormal postural alignment and control Motor - Skilled Clinical Observations: LUE and LLE hemiparesis  Trunk/Postural Assessment  Cervical Assessment Cervical Assessment: Exceptions to [facility] (cervical flexion and protraction at rest along with head turn to the right.  Limited AROM noted with attempted head turn to the left.) Thoracic Assessment Thoracic Assessment: Exceptions to [facility] (thoracic kyphosis) Lumbar Assessment Lumbar Assessment: Exceptions to [facility] (posterior pelvic tilt with increased lumbar flexion in sitting during ADL tasks.) Postural Control Postural Control: Deficits on evaluation Head Control: flexed head Trunk Control: fluctuated between forward LOB and posterior LOB Righting Reactions: delayed  Balance Balance Balance Assessed: Yes Static  Sitting Balance Static Sitting - Balance Support: Feet supported Static Sitting - Level of Assistance: 3: Mod assist Dynamic Sitting Balance Dynamic Sitting - Balance Support: Feet unsupported;During functional activity Dynamic Sitting - Level of Assistance: 2: Max assist Static Standing Balance Static Standing - Balance Support: During functional activity Static Standing - Level of Assistance: 1: +2 Total assist Extremity/Trunk Assessment RUE Assessment RUE Assessment: Within Functional Limits General Strength Comments: AROM WFLS for selfcare tasks, strength not formally assessed throughout but grip 4/5 LUE Assessment LUE Assessment: Exceptions to [facility] Passive Range of Motion (PROM) Comments: WFLs for all joints Active Range of Motion (AROM) Comments: Brunnstrum stage III movement in the left arm with stage II in the hand.  She needs max  hand over hand assist for integration into selfcare tasks. General Strength Comments: She demonstrates some shoulder and elbow flexion in synergy pattern with trace digit flexion/extension.  increased pain at the [facility] joint of the thumb with PROM.  Care Tool Care Tool Self Care Eating   Eating Assist Level: Minimal Assistance - Patient > 75%    Oral Care    Oral Care Assist Level: Minimal Assistance - Patient > 75%    Bathing   Body parts bathed by patient: Chest;Abdomen;Right upper leg;Left upper leg;Face Body parts bathed by helper: Buttocks;Front perineal area;Left lower leg;Right lower leg;Right arm;Left arm;Chest;Abdomen   Assist Level: 2 Helpers    Upper Body Dressing(including orthotics)   What is the patient wearing?: Pull over shirt   Assist Level: Total Assistance - Patient < 25%    Lower Body Dressing (excluding footwear)   What is the patient wearing?: Pants Assist for lower body dressing: 2 Helpers    Putting on/Taking off footwear   What is the patient wearing?: Non-skid slipper socks Assist for footwear: Dependent - Patient  0%       Care Tool Toileting Toileting activity   Assist for toileting: Total Assistance - Patient < 25% (bed level)     Care Tool Bed Mobility Roll left and right activity   Roll left and right assist level: Maximal Assistance - Patient 25 - 49%    Sit to lying activity   Sit to lying assist level: Maximal Assistance - Patient 25 - 49%    Lying to sitting on side of bed activity   Lying to sitting on side of bed assist level: the ability to move from lying on the back to sitting on the side of the bed with no back support.: Maximal Assistance - Patient 25 - 49%     Care Tool Transfers Sit to stand transfer   Sit to stand assist level: 2 Helpers    Chair/bed transfer   Chair/bed transfer assist level: 2 Pension scheme manager transfer   Assist Level: 2 Helpers     Care Tool Cognition  Expression of Ideas and Wants Expression of Ideas and Wants: 3. Some difficulty - exhibits some difficulty with expressing needs and ideas (e.g, some words or finishing thoughts) or speech is not clear  Understanding Verbal and Non-Verbal Content Understanding Verbal and Non-Verbal Content: 3. Usually understands - understands most conversations, but misses some part/intent of message. Requires cues at times to understand   Memory/Recall Ability Memory/Recall Ability : None of the above were recalled   Refer to Care Plan for Long Term Goals  SHORT TERM GOAL WEEK 1 OT Short Term Goal 1 (Week 1): Pt will complete static sitting balance for 5 mins in preparation for selfcare tasks with no more than min assist. OT Short Term Goal 2 (Week 1): Pt will complete UB bathing with min assist in unsupported sitting. OT Short Term Goal 3 (Week 1): Pt will complete LB bathing sit to stand with max assist for two consecutive sessions. OT Short Term Goal 4 (Week 1): Pt will complete toilet transfer squat/stand pivot with mod assist to the drop arm commode.  Recommendations for other services: Therapeutic  Recreation  Stress management   Skilled Therapeutic Intervention ADL ADL Eating: Minimal assistance Where Assessed-Eating: Bed level Grooming: Minimal assistance Where Assessed-Grooming: Edge of bed Upper Body Bathing: Moderate assistance Where Assessed-Upper Body Bathing: Edge of bed Lower Body Bathing: Dependent Where Assessed-Lower Body Bathing: Edge of bed;Bed level  Upper Body Dressing: Dependent Where Assessed-Upper Body Dressing: Edge of bed Lower Body Dressing: Dependent Where Assessed-Lower Body Dressing: Edge of bed;Bed level Toileting: Dependent Where Assessed-Toileting: Bed level Toilet Transfer: Dependent Toilet Transfer Method: Engineer, water: Drop arm bedside commode Tub/Shower Transfer: Not assessed Social research officer, government: Not assessed Mobility  Bed Mobility Bed Mobility: Rolling Right;Rolling Left Rolling Right: Maximal Assistance - Patient 25-49% Rolling Left: Maximal Assistance - Patient 25-49%;Moderate Assistance - Patient 50-74% Transfers Sit to Stand: 2 Helpers Stand to Sit: 2 Helpers  Session Note:  Pt in bed to start with max assist needed to transfer to sitting on the left side of the bed.  Maintains fixed gaze right with slight head turn but will look to the left when stimulated with items on the bedside table or when therapist is presenting a towel or washcloth.  She exhibited flexed trunk in sitting with increased slow forward flexion as she would work on Visual merchandiser tasks.  When therapist would cue her and then try to assist with coming back up to midline, there was a flexor resistance present.  She also demonstrated LOB to the left when attempting to cross her LLE over the right knee to remove her gripper sock and wash her foot.  Mod assist for all UB bathing with mod assist for washing her legs as well.  Did not attempt washing her front at back peri area as she reported this was completed earlier and clean brief was still in place.   When therapist was assisting with washing her feet and donning LB clothing, she exhibited a posterior lean with eventual posterior LOB requiring max assist.  Attempted sit to stand X 2 with total assist needed to complete partial stand secondary to pt not being able to initiate and execute knee, hip, and trunk extension.  Returned to supine with max assist for pulling pants over hips with rolling side to side.  She was left in the bed to rest at end of session with call button and phone in reach.  Pt with slow verbal responses to questions when therapist asked throughout with decreased orientation to place, time, and deficits from CVA.   Discharge Criteria: Patient will be discharged from OT if patient refuses treatment 3 consecutive times without medical reason, if treatment goals not met, if there is a change in medical status, if patient makes no progress towards goals or if patient is discharged from hospital.  The above assessment, treatment plan, treatment alternatives and goals were discussed and mutually agreed upon: by patient  Adelaida Reindel OTR/L 12/23/2020, 4:33 PM

## 2020-12-23 NOTE — Plan of Care (Signed)
  Problem: RH Swallowing Goal: LTG Patient will consume least restrictive diet using compensatory strategies with assistance (SLP) Description: LTG:  Patient will consume least restrictive diet using compensatory strategies with assistance (SLP) Flowsheets (Taken 12/23/2020 1621) LTG: Pt Patient will consume least restrictive diet using compensatory strategies with assistance of (SLP): Supervision Goal: LTG Patient will participate in dysphagia therapy to increase swallow function with assistance (SLP) Description: LTG:  Patient will participate in dysphagia therapy to increase swallow function with assistance (SLP) Flowsheets (Taken 12/23/2020 1621) LTG: Pt will participate in dysphagia therapy to increase swallow function with assistance of (SLP): Supervision Goal: LTG Pt will demonstrate functional change in swallow as evidenced by bedside/clinical objective assessment (SLP) Description: LTG: Patient will demonstrate functional change in swallow as evidenced by bedside/clinical objective assessment (SLP) Flowsheets (Taken 12/23/2020 1621) LTG: Patient will demonstrate functional change in swallow as evidenced by bedside/clinical objective assessment: Oropharyngeal swallow   Problem: RH Expression Communication Goal: LTG Patient will increase speech intelligibility (SLP) Description: LTG: Patient will increase speech intelligibility at word/phrase/conversation level with cues, % of the time (SLP) Flowsheets (Taken 12/23/2020 1621) LTG: Patient will increase speech intelligibility (SLP): Minimal Assistance - Patient > 75%   Problem: RH Problem Solving Goal: LTG Patient will demonstrate problem solving for (SLP) Description: LTG:  Patient will demonstrate problem solving for basic/complex daily situations with cues  (SLP) Flowsheets (Taken 12/23/2020 1621) LTG: Patient will demonstrate problem solving for (SLP): Basic daily situations LTG Patient will demonstrate problem solving for: Minimal  Assistance - Patient > 75%   Problem: RH Memory Goal: LTG Patient will use memory compensatory aids to (SLP) Description: LTG:  Patient will use memory compensatory aids to recall biographical/new, daily complex information with cues (SLP) 12/23/2020 1622 by Charna Elizabeth T, CCC-SLP Flowsheets (Taken 12/23/2020 1622) LTG: Patient will use memory compensatory aids to (SLP):  Supervision  Minimal Assistance - Patient > 75%   Problem: RH Attention Goal: LTG Patient will demonstrate this level of attention during functional activites (SLP) Description: LTG:  Patient will will demonstrate this level of attention during functional activites (SLP) Flowsheets (Taken 12/23/2020 1621) Patient will demonstrate during cognitive/linguistic activities the attention type of: Sustained LTG: Patient will demonstrate this level of attention during cognitive/linguistic activities with assistance of (SLP): Supervision Number of minutes patient will demonstrate attention during cognitive/linguistic activities: 15

## 2020-12-23 NOTE — Progress Notes (Signed)
[place] Individual Statement of Services  Patient Name:  Alicia Guerrero  Date:  12/23/2020  Welcome to the [place].  Our goal is to provide you with an individualized program based on your diagnosis and situation, designed to meet your specific needs.  With this comprehensive rehabilitation program, you will be expected to participate in at least 3 hours of rehabilitation therapies Monday-Friday, with modified therapy programming on the weekends.  Your rehabilitation program will include the following services:  Physical Therapy (PT), Occupational Therapy (OT), Speech Therapy (ST), 24 hour per day rehabilitation nursing, Therapeutic Recreaction (TR), Neuropsychology, Care Coordinator, Rehabilitation Medicine, Nutrition Services, Pharmacy Services, and Other  Weekly team conferences will be held on Wednesdays to discuss your progress.  Your Inpatient Rehabilitation Care Coordinator will talk with you frequently to get your input and to update you on team discussions.  Team conferences with you and your family in attendance may also be held.  Expected length of stay: 14-16 Days  Overall anticipated outcome: Supervison-Min A  Depending on your progress and recovery, your program may change. Your Inpatient Rehabilitation Care Coordinator will coordinate services and will keep you informed of any changes. Your Inpatient Rehabilitation Care Coordinator's name and contact numbers are listed  below.  The following services may also be recommended but are not provided by the [place]:   [place] will be made to provide these services after discharge if needed.  Arrangements include referral to agencies that provide these services.  Your insurance has been verified to be:   [employer] Your primary doctor is:  [name], Tyrone Apple, MD  Pertinent  information will be shared with your doctor and your insurance company.  Inpatient Rehabilitation Care Coordinator:  Erlene Quan, [place] or [phone]  Information discussed with and copy given to patient by: Dyanne Iha, 12/23/2020, 12:21 PM

## 2020-12-23 NOTE — Progress Notes (Signed)
Inpatient Rehabilitation Care Coordinator Assessment and Plan Patient Details  Name: Alicia Guerrero MRN: [id number] Date of Birth: Dec 09, 1935  Today's Date: 12/23/2020  Hospital Problems: Principal Problem:   Stroke (cerebrum) [facility]) Active Problems:   Pressure injury of skin  Past Medical History:  Past Medical History:  Diagnosis Date   Allergic rhinitis due to pollen    Asthma    Atrial fibrillation ([place])    Biventricular ICD (implantable cardiac defibrillator) in place    St Jude 10/2011 (implant MD: Allred)   Bradycardia    a. coreg tapered off 05/2011 => b. Metoprolol restarted after cardiac arrest 09/2011   CAD (coronary artery disease)    a. s/p stent to RCA 1996;  b. s/p cutting balloon PTCA to LAD 2001; c. s/p Cypher DES to LAD 2003;  d. NSTEMI 4/12: DES to dRCA;  e.  [place] 9/13 (after presentation with cardiac arrest and + Nz's for NSTEMI): pLAD 30% ISR, oD1 30%, dLAD 60-70%, pOM1 (small vessel) 90%, oOM2 95% (moderate to large vessel), mCFX 30%, pOM3 30-40%, dRCA stents and pPDA stents patent with 30% ISR => med Rx rec.    Cancer [facility])    Cardiac arrest ([place]) 09/2011   a. VT/VF in community; resusc unsuccessful;  PEA in ED; multiple defibs => revived;  [employer];  c/b by VDRF, cardiogenic shock;  LHC with stable anatomy => med Rx;  difficult ween from vent => s/p trach;  d/c to SNF   Chronic eczema    hands   Chronic systolic heart failure (HCC)    Hemorrhoids    HLD (hyperlipidemia)    HTN (hypertension)    Ischemic cardiomyopathy    a. EF 40% at cath 4/12 with AL and Inf HK;  b.  Echo after presentation with cardiac arrest 9/13:  EF 40-45% and severe inf HK to AK c/w infarction, PASP 42   Left bundle branch block    Other and unspecified ovarian cyst    Other atopic dermatitis and related conditions    Personal history of hyperthyroidism    Personal history of malignant neoplasm of breast    Past Surgical History:  Past Surgical History:  Procedure  Laterality Date   ABDOMINAL HYSTERECTOMY     BI-VENTRICULAR IMPLANTABLE CARDIOVERTER DEFIBRILLATOR N/A 11/02/2011   Procedure: BI-VENTRICULAR IMPLANTABLE CARDIOVERTER DEFIBRILLATOR  (CRT-D);  Surgeon: Thompson Grayer, MD;  Location: [facility] CATH LAB;  Service: Cardiovascular;  Laterality: N/A;   BIV ICD GENERATOR CHANGEOUT N/A 05/11/2017   Procedure: BIV ICD GENERATOR CHANGEOUT;  Surgeon: [name], MD;  Location: [facility] CV LAB;  Service: Cardiovascular;  Laterality: N/A;   BREAST LUMPECTOMY  08/24/2003   right lumpectomy+sln,T2N0,ERPR+,Her2-   CHOLECYSTECTOMY     COLONOSCOPY     CORONARY STENT PLACEMENT  may and  july 2012   LEFT HEART CATHETERIZATION WITH CORONARY ANGIOGRAM N/A 10/09/2011   Procedure: LEFT HEART CATHETERIZATION WITH CORONARY ANGIOGRAM;  Surgeon: Larey Dresser, MD;  Location: [facility] CATH LAB;  Service: Cardiovascular;  Laterality: N/A;   OOPHORECTOMY     POLYPECTOMY     PTCA     VIDEO BRONCHOSCOPY  10/26/2011   Procedure: VIDEO BRONCHOSCOPY WITHOUT FLUORO;  Surgeon: Raylene Miyamoto, MD;  Location: [facility] ENDOSCOPY;  Service: Endoscopy;  Laterality: Bilateral;   Social History:  reports that she has never smoked. She has never used smokeless tobacco. She reports that she does not drink alcohol and does not use drugs.  Family / Support Systems Spouse/Significant Other: Jeneen Rinks  Children: [name] (Son), Jeral Fruit (son) Anticipated Caregiver: [name] Ability/Limitations of Caregiver: none reported Caregiver Availability: 24/7 Family Dynamics: support from spouse, sons, family and church members  Social History Preferred language: English Religion: [employer] - How often do you need to have someone help you when you read instructions, pamphlets, or other written material from your doctor or pharmacy?: Never Writes: Yes Employment Status: Retired Public relations account executive Issues: n/a Guardian/Conservator: [place]   Abuse/Neglect Abuse/Neglect Assessment Can Be  Completed: Yes Physical Abuse: Denies Verbal Abuse: Denies Sexual Abuse: Denies Exploitation of patient/patient's resources: Denies Self-Neglect: Denies  Patient response to: Social Isolation - How often do you feel lonely or isolated from those around you?: Never  Emotional Status Psychiatric History: n/a Substance Abuse History: n/a  Patient / Family Perceptions, Expectations & Goals Pt/Family understanding of illness & functional limitations: yes Premorbid pt/family roles/activities: Independent, limited to the community due to COVID Anticipated changes in roles/activities/participation: spouse unable to assist. Sons and other family will assit at home Pt/family expectations/goals: Supervison/Min [place]: None Premorbid Home Care/DME Agencies: Other (Comment) ([employer], [facility], [facility]) Transportation available at discharge: son able to transport Is the patient able to respond to transportation needs?: No (son reports) In the past 12 months, has lack of transportation kept you from medical appointments or from getting medications?: No In the past 12 months, has lack of transportation kept you from meetings, work, or from getting things needed for daily living?: No  Discharge Planning Living Arrangements: Spouse/significant other Support Systems: Spouse/significant other, Children Type of Residence: Private residence Insurance Resources: Multimedia programmer (specify) Scientist, clinical (histocompatibility and immunogenetics)) Financial Resources: Social Security, [place] Referred: Yes Living Expenses: Own Money Management: Patient, Spouse Home Management: Independent Patient/Family Preliminary Plans: sons plan to assist Care Coordinator Barriers to Discharge: Insurance for SNF coverage, Lack of/limited family support Care Coordinator Anticipated Follow Up Needs: [place] Additional Notes/Comments: Patient lives with spouse who unable to provide physical assistance Expected  length of stay: 14-16 Days  Clinical Impression Sw spoke with patient son Karma Greaser). Pt son will serve as primary contact. SW introduced self, and explained role. Pt son requesting to have him, his brother and father as the only visitors. Pt spouse unable to provide any physical assistance. Pt sons and other family will assist pt at home. Pt son would like to restrict any other visitors, RN and CM have been informed. No additional questions or concerns, sw will continue to follow up.  Dyanne Iha 12/23/2020, 1:10 PM

## 2020-12-23 NOTE — Progress Notes (Signed)
Inpatient Rehabilitation  Patient information reviewed and entered into eRehab system by Jezelle Gullick M. Booker Bhatnagar, M.A., CCC/SLP, PPS Coordinator.  Information including medical coding, functional ability and quality indicators will be reviewed and updated through discharge.    

## 2020-12-23 NOTE — Progress Notes (Signed)
PROGRESS NOTE   Subjective/Complaints:  Working with SLP who notes problems with alertness, pt denies pain , She is not oriented  LPN notes gurgling after drinking thin liq, , pt is breathing comfortably at present  ROS- neg CP, SOB, N/V/D Objective:   No results found. Recent Labs    12/23/20 0537  WBC 9.6  HGB 14.1  HCT 42.3  PLT 300   Recent Labs    12/21/20 0617 12/23/20 0537  NA 141 132*  K 4.2 4.3  CL 110 100  CO2 24 24  GLUCOSE 131* 147*  BUN 18 17  CREATININE 0.51 0.69  CALCIUM 9.4 9.7    Intake/Output Summary (Last 24 hours) at 12/23/2020 0820 Last data filed at 12/23/2020 4132 Gross per 24 hour  Intake 100 ml  Output --  Net 100 ml     Pressure Injury 12/22/20 Coccyx Left Stage 1 -  Intact skin with non-blanchable redness of a localized area usually over a bony prominence. (Active)  12/22/20 1647  Location: Coccyx  Location Orientation: Left  Staging: Stage 1 -  Intact skin with non-blanchable redness of a localized area usually over a bony prominence.  Wound Description (Comments):   Present on Admission: Yes    Physical Exam: Vital Signs Blood pressure 139/71, pulse 69, temperature (!) 97.5 F (36.4 C), resp. rate 16, height 5\' 3"  (1.6 m), weight 64.5 kg, SpO2 94 %.  General: No acute distress Mood and affect are appropriate Heart: Regular rate and rhythm no rubs murmurs or extra sounds Lungs: Clear to auscultation, breathing unlabored, no rales or wheezes Abdomen: Positive bowel sounds, soft nontender to palpation, nondistended Extremities: No clubbing, cyanosis, or edema Skin: No evidence of breakdown, no evidence of rash Neurologic: Oriented to person only , "school, February" Cranial nerves II through XII intact, motor strength is 5/5 in Right deltoid, bicep, tricep, grip, hip flexor, knee extensors, ankle dorsiflexor and plantar flexor, 2- Left delt bic , tri, grip, 3- L HF, KE  ADF Sensory exam normal sensation to light touch and proprioception in bilateral upper and lower extremities Cerebellar exam normal finger to nose to finger as well as heel to shin in bilateral upper and lower extremities Musculoskeletal: Full range of motion in all 4 extremities. No joint swelling    Assessment/Plan: 1. Functional deficits which require 3+ hours per day of interdisciplinary therapy in a comprehensive inpatient rehab setting. Physiatrist is providing close team supervision and 24 hour management of active medical problems listed below. Physiatrist and rehab team continue to assess barriers to discharge/monitor patient progress toward functional and medical goals  Care Tool:  Bathing              Bathing assist       Upper Body Dressing/Undressing Upper body dressing   What is the patient wearing?: Hospital gown only    Upper body assist Assist Level: Total Assistance - Patient < 25%    Lower Body Dressing/Undressing Lower body dressing      What is the patient wearing?: Incontinence brief     Lower body assist Assist for lower body dressing: Total Assistance - Patient < 25%  Toileting Toileting    Toileting assist Assist for toileting: Total Assistance - Patient < 25%     Transfers Chair/bed transfer  Transfers assist           Locomotion Ambulation   Ambulation assist              Walk 10 feet activity   Assist           Walk 50 feet activity   Assist           Walk 150 feet activity   Assist           Walk 10 feet on uneven surface  activity   Assist           Wheelchair     Assist               Wheelchair 50 feet with 2 turns activity    Assist            Wheelchair 150 feet activity     Assist          Blood pressure 139/71, pulse 69, temperature (!) 97.5 F (36.4 C), resp. rate 16, height 5\' 3"  (1.6 m), weight 64.5 kg, SpO2 94 %.  Medical Problem List  and Plan: 1. Functional deficits secondary to posterior circulation stroke             -patient may shower             -ELOS/Goals: 10-14 days S             -Admit to CIR 2.  Antithrombotics: -DVT/anticoagulation:  Pharmaceutical: Other (comment)--Eliquis             -antiplatelet therapy: ASA added  3. Pain Management: Tylenol prn.  4. Mood: LCSW to follow for evaluation and support.              -antipsychotic agents: N/A 5. Neuropsych: This patient is not fully capable of making decisions on capable own behalf. 6. Skin/Wound Care: Routine pressure relief measures 7. Fluids/Electrolytes/Nutrition:  N/A 8. CAF/ICM s/p ICD: Monitor HR TID. Continue Eliquis and bisoprolol.             --monitor for symptoms with increase in activity.  52. H/o CVA with L-HP: Was independent without AD PTA. 10. HTN: Monitor BP TID--SBP goal 120-140 range per neur due to MCA/PCA stenosis             --continue bisoprolol and Avapro.  Vitals:   12/22/20 1924 12/23/20 0339  BP: (!) 142/72 139/71  Pulse: 73 69  Resp: 16 16  Temp: (!) 97.5 F (36.4 C) (!) 97.5 F (36.4 C)  SpO2: 95% 94%  In range  11. Dysphagia: Continue D1, nectar liquids. Recheck BMET in am.             --does not like current diet. Discussed dysphagia and need for modified diet.  12. T2DM: Hgb A1C- 6.0. Continue to hold metformin             --monitor BS ac/hs and use SSI for now.  CBG (last 3)  Recent Labs    12/22/20 1731 12/22/20 2113 12/23/20 0547  GLUCAP 134* 154* 148*    13. Chronic intermittent insomnia: Will resume Melatonin (used 10 mg prn PTA) 14. Chronic congeseted cough: For 5-6 years and felt to be due to allergies. --Will add Claritin.     LOS: 1 days A FACE TO [place]  E Norberto Wishon 12/23/2020, 8:20 AM

## 2020-12-23 NOTE — Progress Notes (Signed)
Patient ID: Alicia Guerrero, female   DOB: 10/25/1935, 85 y.o.   MRN: [id number] Met with the patient to introduce self and role. Review current medical status, rehab routine and plan of care. Discussed secondary risks including HTN, HLD (LDD 79), DM (A1C 6.0), HF and Af-b with ICD implanted. Reports meds managed by spouse. Son does the cooking at home.  Reported understood rationale for CXR; has been having shortness of breath mostly at night. Not sleeping well; prn on profile, reminded to ask and notified nurse of need.  Continue to follow along to discharge to address educational needs and collaborate with the team to facilitate preparation for discharge. Margarito Liner

## 2020-12-24 DIAGNOSIS — I6300 Cerebral infarction due to thrombosis of unspecified precerebral artery: Secondary | ICD-10-CM | POA: Diagnosis not present

## 2020-12-24 LAB — GLUCOSE, CAPILLARY
Glucose-Capillary: 133 mg/dL — ABNORMAL HIGH (ref 70–99)
Glucose-Capillary: 184 mg/dL — ABNORMAL HIGH (ref 70–99)
Glucose-Capillary: 129 mg/dL — ABNORMAL HIGH (ref 70–99)
Glucose-Capillary: 179 mg/dL — ABNORMAL HIGH (ref 70–99)

## 2020-12-24 MED ORDER — SODIUM CHLORIDE 0.9 % IV BOLUS
250.0000 mL | Freq: Once | INTRAVENOUS | Status: AC
  Administered 2020-12-24: 250 mL via INTRAVENOUS

## 2020-12-24 MED ORDER — AMLODIPINE BESYLATE 5 MG PO TABS
5.0000 mg | ORAL_TABLET | Freq: Every day | ORAL | Status: DC
  Administered 2020-12-25 – 2021-01-12 (×19): 5 mg via ORAL
  Filled 2020-12-24 (×19): qty 1

## 2020-12-24 NOTE — Progress Notes (Signed)
Occupational Therapy Session Note  Patient Details  Name: Alicia Guerrero MRN: [id number] Date of Birth: 08-17-1935  Today's Date: 12/24/2020 OT Individual Time: 9242-6834 OT Individual Time Calculation (min): 56 min    Short Term Goals: Week 1:  OT Short Term Goal 1 (Week 1): Pt will complete static sitting balance for 5 mins in preparation for selfcare tasks with no more than min assist. OT Short Term Goal 2 (Week 1): Pt will complete UB bathing with min assist in unsupported sitting. OT Short Term Goal 3 (Week 1): Pt will complete LB bathing sit to stand with max assist for two consecutive sessions. OT Short Term Goal 4 (Week 1): Pt will complete toilet transfer squat/stand pivot with mod assist to the drop arm commode.  Skilled Therapeutic Interventions/Progress Updates:    Pt in bed to start alert but not oriented to place or time.  She was able to state the month however but not day of the week.  Worked on LB dressing in bed with total assist for donning pants with max assist for initiation and completion of rolling side to side.  Max demonstrational cueing for sequencing flexing of knees and bringing the LUE across for rolling to the right.  Next, she transitioned to sitting with total assist and maintained static sitting balance with overall mod assist.  Increased slow posterior LOB secondary to motor impersistence with therapist having to help he come back up to midline.  Flexed head with rotation to the right unless cued to look left by therapist.  She needed max assist for donning a pullover shirt with completion of squat pivot transfer to the wheelchair at max assist as well.  Therapist took her over to the sink via tilt in space wheelchair where she combed her hair with setup using the RUE and then completed brushing her gums and putting in her dentures with min assist.  Max hand over hand assist for holding the toothpaste with the LUE and removing the cap.  Finished session with full  lap tray in place and with the call button and phone in reach.  Safety belt in place with NT in for CBG.    Therapy Documentation Precautions:  Precautions Precautions: Fall, Other (comment) Precaution Comments: L side hemiparesis, left neglect Restrictions Weight Bearing Restrictions: No   Pain: Pain Assessment Pain Scale: 0-10 Pain Score: 0-No pain ADL: See Care Tool Section for some details of mobility and selfcare   Therapy/Group: Individual Therapy  Maly Lemarr OTR/L 12/24/2020, 12:48 PM

## 2020-12-24 NOTE — Progress Notes (Signed)
Speech Language Pathology Daily Session Note  Patient Details  Name: Alicia Guerrero MRN: [id number] Date of Birth: [birth year]  Today's Date: 12/24/2020 SLP Individual Time: 1430-1530 SLP Individual Time Calculation (min): 60 min  Short Term Goals: Week 1: SLP Short Term Goal 1 (Week 1): Patient will use external aids to orient to person/place/time/situation with mod A verbal cues SLP Short Term Goal 2 (Week 1): Patient will perform basic and familiar tasks for 5 minute intervals with mod A verbal cues for redirection SLP Short Term Goal 3 (Week 1): Patient will complete basic, familiar tasks with mod A verbal cues for functional problem solving SLP Short Term Goal 4 (Week 1): Patient will utilize speech intelligiblity strategies at sentence level to achieve 90% accuracy with mod A verbal cues SLP Short Term Goal 5 (Week 1): Patient will consume current diet with improved oral control and without overt s/sx of aspiration with min A verbal cues  Skilled Therapeutic Interventions: Skilled ST treatment focused on cognitive-communication and swallowing goals. Pt's son was present during today's session and actively engaged in educational opportunities. Son endorsed pt exhibited declining memory, communication skills (frequently forgetting words or word finding difficulty), as well as decreased management of personal care, cooking, and taking medicine at prior level. Per son, spouse is an unreliable care partner. SLP facilitated session by providing education on dysphagia and thickened liquids. Upon arrival, patient had thickened liquids at bedside that were evidently incorrect consistency (honey thick, near extremely thick consistency). When asked who provided patient with the beverage son expressed he had brought root beer from home. He reported he requested staff to provide him with thickener and they gave him a honey thick liquid packet. SLP educated on and demonstrated properly thickening liquids to  nectar thick consistency, and also recommendations for staff to thicken beverages only. Staff communication has been updated on signs inside/outside of room. Educated son on swallow precautions and strategies during meals including one bite/sip at a time, slow rate of intake, upright positioning, and to assess for pocketing with each bite. Son demonstrated understanding by providing effective cueing to patient with liquid consumption. Only son may assist patient with consumption of meals following today's education. Other family members have not been trained at this time and therefore not cleared for assisting patient with PO intake. Patient was oriented to person, place, and month this date with modified independence by referring to external aids that were placed by ST during yesterday's session. Pt had general understanding of situation required total A with year. Pt was more alert this date but easily distracted by television which was turned off to increase attention to task. Speech was more intelligible today (~75% intelligible) with improved vocal intensity. Patient was left in bed with alarm activated and immediate needs within reach at end of session. Continue per current plan of care.       Pain  No pain  Therapy/Group: Individual Therapy  Patty Sermons 12/24/2020, 4:04 PM

## 2020-12-24 NOTE — Progress Notes (Signed)
PROGRESS NOTE   Subjective/Complaints:   Alert this am , oriented to person , place  [facility]) and month  ROS- neg CP, SOB, N/V/D Objective:   DG CHEST PORT 1 VIEW  Result Date: 12/23/2020 CLINICAL DATA:  Cough, concern for aspiration EXAM: PORTABLE CHEST 1 VIEW COMPARISON:  Chest radiograph 07/02/2017 FINDINGS: A left chest wall cardiac device with 3 associated leads is stable. The heart is mildly enlarged, unchanged. There is no focal consolidation or pulmonary edema. There is no pleural effusion or pneumothorax. There is dextroscoliosis of the thoracic spine. There is no acute osseous abnormality. IMPRESSION: Mild cardiomegaly. No radiographic evidence of acute cardiopulmonary process. Electronically Signed   By: Valetta Mole M.D.   On: 12/23/2020 11:04   Recent Labs    12/23/20 0537  WBC 9.6  HGB 14.1  HCT 42.3  PLT 300    Recent Labs    12/23/20 0537  NA 132*  K 4.3  CL 100  CO2 24  GLUCOSE 147*  BUN 17  CREATININE 0.69  CALCIUM 9.7     Intake/Output Summary (Last 24 hours) at 12/24/2020 0831 Last data filed at 12/24/2020 0827 Gross per 24 hour  Intake 580 ml  Output --  Net 580 ml      Pressure Injury 12/22/20 Coccyx Left Stage 1 -  Intact skin with non-blanchable redness of a localized area usually over a bony prominence. (Active)  12/22/20 1647  Location: Coccyx  Location Orientation: Left  Staging: Stage 1 -  Intact skin with non-blanchable redness of a localized area usually over a bony prominence.  Wound Description (Comments):   Present on Admission: Yes    Physical Exam: Vital Signs Blood pressure 119/67, pulse 63, temperature 97.6 F (36.4 C), temperature source Oral, resp. rate 18, height 5\' 3"  (1.6 m), weight 64.5 kg, SpO2 95 %.  General: No acute distress Mood and affect are appropriate Heart: Regular rate and rhythm no rubs murmurs or extra sounds Lungs: Clear to auscultation,  breathing unlabored, no rales or wheezes Abdomen: Positive bowel sounds, soft nontender to palpation, nondistended Extremities: No clubbing, cyanosis, or edema Skin: No evidence of breakdown, no evidence of rash Neurologic: Oriented to person only , "school, February" Cranial nerves II through XII intact, motor strength is 5/5 in Right deltoid, bicep, tricep, grip, hip flexor, knee extensors, ankle dorsiflexor and plantar flexor, 2- Left delt bic , tri, grip, 3- L HF, KE ADF Sensory exam normal sensation to light touch and proprioception in bilateral upper and lower extremities Cerebellar exam normal finger to nose to finger as well as heel to shin in bilateral upper and lower extremities Musculoskeletal: Full range of motion in all 4 extremities. No joint swelling    Assessment/Plan: 1. Functional deficits which require 3+ hours per day of interdisciplinary therapy in a comprehensive inpatient rehab setting. Physiatrist is providing close team supervision and 24 hour management of active medical problems listed below. Physiatrist and rehab team continue to assess barriers to discharge/monitor patient progress toward functional and medical goals  Care Tool:  Bathing    Body parts bathed by patient: Chest, Abdomen, Right upper leg, Left upper leg, Face  Body parts bathed by helper: Buttocks, Front perineal area, Left lower leg, Right lower leg, Right arm, Left arm, Chest, Abdomen     Bathing assist Assist Level: 2 Helpers     Upper Body Dressing/Undressing Upper body dressing   What is the patient wearing?: Pull over shirt    Upper body assist Assist Level: Total Assistance - Patient < 25%    Lower Body Dressing/Undressing Lower body dressing      What is the patient wearing?: Pants     Lower body assist Assist for lower body dressing: Total Assistance - Patient < 25%     Toileting Toileting    Toileting assist Assist for toileting: Total Assistance - Patient < 25%      Transfers Chair/bed transfer  Transfers assist  Chair/bed transfer activity did not occur: Safety/medical concerns  Chair/bed transfer assist level: 2 Helpers     Locomotion Ambulation   Ambulation assist   Ambulation activity did not occur: Safety/medical concerns          Walk 10 feet activity   Assist  Walk 10 feet activity did not occur: Safety/medical concerns        Walk 50 feet activity   Assist Walk 50 feet with 2 turns activity did not occur: Safety/medical concerns         Walk 150 feet activity   Assist Walk 150 feet activity did not occur: Safety/medical concerns         Walk 10 feet on uneven surface  activity   Assist Walk 10 feet on uneven surfaces activity did not occur: Safety/medical concerns         Wheelchair     Assist Is the patient using a wheelchair?: No             Wheelchair 50 feet with 2 turns activity    Assist            Wheelchair 150 feet activity     Assist          Blood pressure 119/67, pulse 63, temperature 97.6 F (36.4 C), temperature source Oral, resp. rate 18, height 5\' 3"  (1.6 m), weight 64.5 kg, SpO2 95 %.  Medical Problem List and Plan: 1. Functional deficits secondary to posterior circulation stroke             -patient may shower             -ELOS/Goals: 10-14 days S             -Admit to CIR 2.  Antithrombotics: -DVT/anticoagulation:  Pharmaceutical: Other (comment)--Eliquis             -antiplatelet therapy: ASA added  3. Pain Management: Tylenol prn.  4. Mood: LCSW to follow for evaluation and support.              -antipsychotic agents: N/A 5. Neuropsych: This patient is not fully capable of making decisions on capable own behalf. 6. Skin/Wound Care: Routine pressure relief measures 7. Fluids/Electrolytes/Nutrition:  N/A 8. CAF/ICM s/p ICD: Monitor HR TID. Continue Eliquis and bisoprolol.             --monitor for symptoms with increase in activity.  48.  H/o CVA with L-HP: Was independent without AD PTA. 10. HTN: Monitor BP TID--SBP goal 120-140 range per neur due to MCA/PCA stenosis             --continue bisoprolol and Avapro.  Vitals:   12/23/20 1925 12/24/20 0232  BP:  116/66 119/67  Pulse: 66 63  Resp: 16 18  Temp: 98.1 F (36.7 C) 97.6 F (36.4 C)  SpO2: 97% 95%  Below range will reduce amlodipine to 5mg    11. Dysphagia: Continue D1, nectar liquids. Recheck BMET in am.             --does not like current diet. Discussed dysphagia and need for modified diet.  12. T2DM: Hgb A1C- 6.0. Continue to hold metformin             --monitor BS ac/hs and use SSI for now.  CBG (last 3)  Recent Labs    12/23/20 1635 12/23/20 2116 12/24/20 0612  GLUCAP 154* 129* 133*   Controlled 12/9  13. Chronic intermittent insomnia: Will resume Melatonin (used 10 mg prn PTA) 14. Chronic congeseted cough: For 5-6 years and felt to be due to allergies. --Will add Claritin.    15.  Lethargy labs ok, afebrile, no sedating meds  LOS: 2 days A FACE TO FACE EVALUATION WAS PERFORMED  Charlett Blake 12/24/2020, 8:31 AM

## 2020-12-24 NOTE — Plan of Care (Signed)
  Problem: Consults Goal: RH STROKE PATIENT EDUCATION Description: See Patient Education module for education specifics  Outcome: Progressing   Problem: RH BOWEL ELIMINATION Goal: RH STG MANAGE BOWEL WITH ASSISTANCE Description: STG Manage Bowel with mod I Assistance. Outcome: Progressing Goal: RH STG MANAGE BOWEL W/MEDICATION W/ASSISTANCE Description: STG Manage Bowel with Medication with mod I Assistance. Outcome: Progressing   Problem: RH BLADDER ELIMINATION Goal: RH STG MANAGE BLADDER WITH ASSISTANCE Description: STG Manage Bladder With out Assistance Outcome: Progressing   Problem: RH SAFETY Goal: RH STG ADHERE TO SAFETY PRECAUTIONS W/ASSISTANCE/DEVICE Description: STG Adhere to Safety Precautions With cues Assistance/Device. Outcome: Progressing   Problem: RH KNOWLEDGE DEFICIT Goal: RH STG INCREASE KNOWLEDGE OF HYPERTENSION Description: Patient and son will be able to manage HTN with medications and dietary modifications using handouts and educational resources independently Outcome: Progressing Goal: RH STG INCREASE KNOWLEDGE OF DYSPHAGIA/FLUID INTAKE Description: Patient and son will be able to manage dysphagia, medications and dietary modifications using handouts and educational resources independently Outcome: Progressing Goal: RH STG INCREASE KNOWLEGDE OF HYPERLIPIDEMIA Description: Patient and son will be able to manage HLD with medications and dietary modifications using handouts and educational resources independently Outcome: Progressing Goal: RH STG INCREASE KNOWLEDGE OF STROKE PROPHYLAXIS Description: Patient and son will be able to manage secondary stroke risks with medications and dietary modifications using handouts and educational resources independently Outcome: Progressing   Problem: RH Vision Goal: RH LTG Vision (Specify) Outcome: Progressing

## 2020-12-24 NOTE — IPOC Note (Signed)
Overall Plan of Care [facility]) Patient Details Name: Alicia Guerrero MRN: [id number] DOB: 07/28/1935  Admitting Diagnosis: Stroke (cerebrum) [facility])  Hospital Problems: Principal Problem:   Stroke (cerebrum) ([place]) Active Problems:   Pressure injury of skin     Functional Problem List: Nursing Bladder, Medication Management, Safety, Nutrition, Endurance, Bowel  PT Balance, Perception, Safety, Behavior, Edema, Sensory, Endurance, Skin Integrity, Motor, Nutrition, Pain  OT Balance, Cognition, Perception, Safety, Sensory, Vision, Endurance, Motor, Pain  SLP Cognition, Safety, Nutrition  TR         Basic ADL's: OT Eating, Grooming, Bathing, Dressing, Toileting     Advanced  ADL's: OT       Transfers: PT Bed Mobility, Bed to Chair, Car, Manufacturing systems engineer, Metallurgist: PT Ambulation, Emergency planning/management officer, Stairs     Additional Impairments: OT Fuctional Use of Upper Extremity  SLP Swallowing, Communication, Social Cognition comprehension Social Interaction, Problem Solving, Memory, Awareness, Attention  TR      Anticipated Outcomes Item Anticipated Outcome  Self Feeding supervision  Swallowing  sup A   Basic self-care  min assist level  Toileting  min assist level   Bathroom Transfers min assist level  Bowel/Bladder  manage bowel w mod I and bladder without assist  Transfers  min assist using LRAD  Locomotion  min assist using LRAD limited distances  Communication  min A  Cognition  min A  Pain  n/a  Safety/Judgment  maintain safety w cues/reminders   Therapy Plan: PT Intensity: Minimum of 1-2 x/day ,45 to 90 minutes PT Frequency: 5 out of 7 days PT Duration Estimated Length of Stay: ~ 4 weeks OT Intensity: Minimum of 1-2 x/day, 45 to 90 minutes OT Frequency: 5 out of 7 days OT Duration/Estimated Length of Stay: 23-27 days SLP Intensity: Minumum of 1-2 x/day, 30 to 90 minutes SLP Frequency: 3 to 5 out of 7 days SLP Duration/Estimated  Length of Stay: ~3.5 weeks   Due to the current state of emergency, patients may not be receiving their 3-hours of Medicare-mandated therapy.   Team Interventions: Nursing Interventions Bladder Management, Disease Management/Prevention, Medication Management, Discharge Planning, Dysphagia/Aspiration Precaution Training, Bowel Management, Patient/Family Education  PT interventions Ambulation/gait training, Community reintegration, DME/adaptive equipment instruction, Neuromuscular re-education, Psychosocial support, Stair training, UE/LE Strength taining/ROM, Wheelchair propulsion/positioning, Training and development officer, Discharge planning, Functional electrical stimulation, Pain management, Skin care/wound management, Therapeutic Activities, UE/LE Coordination activities, Cognitive remediation/compensation, Disease management/prevention, Functional mobility training, Patient/family education, Splinting/orthotics, Therapeutic Exercise, Visual/perceptual remediation/compensation  OT Interventions Balance/vestibular training, Discharge planning, Pain management, Self Care/advanced ADL retraining, Therapeutic Activities, UE/LE Coordination activities, Cognitive remediation/compensation, Disease mangement/prevention, Functional mobility training, Patient/family education, Therapeutic Exercise, DME/adaptive equipment instruction, Functional electrical stimulation, Neuromuscular re-education, UE/LE Strength taining/ROM, Splinting/orthotics, Community reintegration, Psychosocial support, Visual/perceptual remediation/compensation, Wheelchair propulsion/positioning  SLP Interventions Cognitive remediation/compensation, English as a second language teacher, Dysphagia/aspiration precaution training, Environmental controls, Functional tasks, Patient/family education, Therapeutic Activities  TR Interventions    SW/CM Interventions Discharge Planning, Psychosocial Support, Patient/Family Education, Disease Management/Prevention    Barriers to Discharge MD  Medical stability  Nursing Decreased caregiver support, Home environment access/layout, Nutrition means, Incontinence 1 level 1ste no rails w disabled spouse; son checks on weekly  PT Decreased caregiver support, Home environment access/layout, Nutrition means    OT      SLP      SW Insurance for SNF coverage, Lack of/limited family support     Team Discharge Planning: Destination: PT-Home ,OT- Home , SLP-Home Projected Follow-up: PT-Home health PT, 24 hour supervision/assistance,  OT-  Home health OT, 24 hour supervision/assistance, SLP-24 hour supervision/assistance, Home Health SLP Projected Equipment Needs: PT-To be determined, OT- To be determined, SLP-None recommended by SLP Equipment Details: PT- , OT-  Patient/family involved in discharge planning: PT- Patient, Family member/caregiver,  OT-Patient, SLP-Patient  MD ELOS: 12-16d Medical Rehab Prognosis:  Good Assessment: 85 year LH-female with history of ICM w/chronic systolic CHF s/p ICD, A fib, prior CVA with mild L-HP who was admitted to [facility] on 12/17/20 with increase in left sided weakness and slurred speech. CT head negative and neurology felt that patient with posterior circulation stroke likely cardioembolic v/s small vessel disease. MRI not done due to ICD. CTA head showed no LVO and severe stenosis L-MCA M1 segment and short segment distal R-PCA P2 segment. 2 D echo showed LVEF hypokinesis of mid/distal lateral, apical, distal anterior walls with EF 40% --new changes c/w prior echo and aortic sclerosis.    Low dose ASA added by Dr. Hortense Ramal. MBS done revealing oropharyngeal dysphagia with aspiration of thins and patient. She continues to be limited by left sided weakness with dysarthria, loss of balance to there right at EOB and attempts at standing with knees blocked and multimodal cues. CIR recommended due to functional decline. Currently complaints of cough    See Team Conference Notes for weekly  updates to the plan of care

## 2020-12-25 LAB — GLUCOSE, CAPILLARY
Glucose-Capillary: 121 mg/dL — ABNORMAL HIGH (ref 70–99)
Glucose-Capillary: 135 mg/dL — ABNORMAL HIGH (ref 70–99)
Glucose-Capillary: 131 mg/dL — ABNORMAL HIGH (ref 70–99)
Glucose-Capillary: 185 mg/dL — ABNORMAL HIGH (ref 70–99)

## 2020-12-25 NOTE — Progress Notes (Signed)
Physical Therapy Session Note  Patient Details  Name: Alicia Guerrero MRN: [id number] Date of Birth: 1935-10-01  Today's Date: 12/25/2020 PT Individual Time: 0800-0900 PT Individual Time Calculation (min): 60 min   Short Term Goals: Week 1:  PT Short Term Goal 1 (Week 1): Pt will perform supine<>sit with max assist PT Short Term Goal 2 (Week 1): Pt will tolerate upright sitting on EOB/EOM for at least 2 minutes with no more than mod assist PT Short Term Goal 3 (Week 1): Pt will perform bed<>chair transfers with +2 max assist using  LRAD PT Short Term Goal 4 (Week 1): Pt will perform sit<>stands using LRAD with +2 max assist  Skilled Therapeutic Interventions/Progress Updates:    pt received in bed and agreeable to therapy. No complaint of pain. Pt donned pants with tot A, mod A for rolling to pull pants over hips. Supine>sit with +2 assist for trunk management and max VC. Scooting with max A and multimodal cues to position for squat pivot. Pt instructed on and performed squat pivot transfer with tot A x2. Pt transported to therapy gym for time management and energy conservation. Pt directed in squat pivot mat<>w/c x 2 with tot +2 fading to tot +1 by end of session. Pt directed in leaning onto L elbow for weightbearing and improved independence with bed mobility. Max facilitation and multimodal cueing for LUE use and upright posture. Pt returned to room and requested to toilet. +2 stedy transfer to toilet with CGA for sitting balance, max A x 2 for Sit to stand and clothing management, tot A for pericare. Pt returned to bed with +2 tot A and remained with all needs in reach and alarm active.   Therapy Documentation Precautions:  Precautions Precautions: Fall, Other (comment) Precaution Comments: L side hemiparesis, left neglect Restrictions Weight Bearing Restrictions: No     Therapy/Group: Individual Therapy  Mickel Fuchs 12/25/2020, 12:09 PM

## 2020-12-25 NOTE — Progress Notes (Signed)
PROGRESS NOTE   Subjective/Complaints:  Pt reports no issues- eating D1 nectar thick liquids breakfast- has eaten ~ 40% already. NT reports she's in the middle eating.     ROS-  Pt denies SOB, abd pain, CP, N/V/C/D, and vision changes  Objective:   No results found. Recent Labs    12/23/20 0537  WBC 9.6  HGB 14.1  HCT 42.3  PLT 300   Recent Labs    12/23/20 0537  NA 132*  K 4.3  CL 100  CO2 24  GLUCOSE 147*  BUN 17  CREATININE 0.69  CALCIUM 9.7    Intake/Output Summary (Last 24 hours) at 12/25/2020 1422 Last data filed at 12/24/2020 1807 Gross per 24 hour  Intake 180 ml  Output --  Net 180 ml     Pressure Injury 12/22/20 Coccyx Left Stage 1 -  Intact skin with non-blanchable redness of a localized area usually over a bony prominence. (Active)  12/22/20 1647  Location: Coccyx  Location Orientation: Left  Staging: Stage 1 -  Intact skin with non-blanchable redness of a localized area usually over a bony prominence.  Wound Description (Comments):   Present on Admission: Yes    Physical Exam: Vital Signs Blood pressure 130/68, pulse 66, temperature 98 F (36.7 C), temperature source Oral, resp. rate 17, height 5\' 3"  (1.6 m), weight 64.5 kg, SpO2 98 %.    General: awake, alert, intent on eating her breakfast; NT at bedside; NAD HENT: conjugate gaze; oropharynx moist CV: regular rate; no JVD Pulmonary: CTA B/L; no W/R/R- good air movement GI: soft, NT, ND, (+)BS Psychiatric: appropriate; perseverating on food Neurological: alert- knows here at [facility] and why here.   Skin: No evidence of breakdown, no evidence of rash Neurologic: Oriented to person only , "school, February" Cranial nerves II through XII intact, motor strength is 5/5 in Right deltoid, bicep, tricep, grip, hip flexor, knee extensors, ankle dorsiflexor and plantar flexor, 2- Left delt bic , tri, grip, 3- L HF, KE ADF Sensory exam  normal sensation to light touch and proprioception in bilateral upper and lower extremities Cerebellar exam normal finger to nose to finger as well as heel to shin in bilateral upper and lower extremities Musculoskeletal: Full range of motion in all 4 extremities. No joint swelling    Assessment/Plan: 1. Functional deficits which require 3+ hours per day of interdisciplinary therapy in a comprehensive inpatient rehab setting. Physiatrist is providing close team supervision and 24 hour management of active medical problems listed below. Physiatrist and rehab team continue to assess barriers to discharge/monitor patient progress toward functional and medical goals  Care Tool:  Bathing    Body parts bathed by patient: Chest, Abdomen, Right upper leg, Left upper leg, Face   Body parts bathed by helper: Buttocks, Front perineal area, Left lower leg, Right lower leg, Right arm, Left arm, Chest, Abdomen     Bathing assist Assist Level: 2 Helpers     Upper Body Dressing/Undressing Upper body dressing   What is the patient wearing?: Pull over shirt    Upper body assist Assist Level: Maximal Assistance - Patient 25 - 49%    Lower Body Dressing/Undressing  Lower body dressing      What is the patient wearing?: Pants     Lower body assist Assist for lower body dressing: Total Assistance - Patient < 25% (supine rolling)     Toileting Toileting    Toileting assist Assist for toileting: Total Assistance - Patient < 25%     Transfers Chair/bed transfer  Transfers assist  Chair/bed transfer activity did not occur: Safety/medical concerns  Chair/bed transfer assist level: Total Assistance - Patient < 25% (squat pivot)     Locomotion Ambulation   Ambulation assist   Ambulation activity did not occur: Safety/medical concerns          Walk 10 feet activity   Assist  Walk 10 feet activity did not occur: Safety/medical concerns        Walk 50 feet  activity   Assist Walk 50 feet with 2 turns activity did not occur: Safety/medical concerns         Walk 150 feet activity   Assist Walk 150 feet activity did not occur: Safety/medical concerns         Walk 10 feet on uneven surface  activity   Assist Walk 10 feet on uneven surfaces activity did not occur: Safety/medical concerns         Wheelchair     Assist Is the patient using a wheelchair?: No             Wheelchair 50 feet with 2 turns activity    Assist            Wheelchair 150 feet activity     Assist          Blood pressure 130/68, pulse 66, temperature 98 F (36.7 C), temperature source Oral, resp. rate 17, height 5\' 3"  (1.6 m), weight 64.5 kg, SpO2 98 %.  Medical Problem List and Plan: 1. Functional deficits secondary to posterior circulation stroke             -patient may shower             -ELOS/Goals: 10-14 days S             -Continue CIR- PT, OT and SLP 2.  Antithrombotics: -DVT/anticoagulation:  Pharmaceutical: Other (comment)--Eliquis             -antiplatelet therapy: ASA added  3. Pain Management: Tylenol prn.  4. Mood: LCSW to follow for evaluation and support.              -antipsychotic agents: N/A 5. Neuropsych: This patient is not fully capable of making decisions on capable own behalf. 6. Skin/Wound Care: Routine pressure relief measures 7. Fluids/Electrolytes/Nutrition:  N/A 8. CAF/ICM s/p ICD: Monitor HR TID. Continue Eliquis and bisoprolol.             --monitor for symptoms with increase in activity.   12/10- HR is doing OK- 60s- con't regimen 9. H/o CVA with L-HP: Was independent without AD PTA. 10. HTN: Monitor BP TID--SBP goal 120-140 range per neur due to MCA/PCA stenosis             --continue bisoprolol and Avapro.  Vitals:   12/25/20 0550 12/25/20 1245  BP: 137/74 130/68  Pulse: 66 66  Resp: 18 17  Temp: 98 F (36.7 C) 98 F (36.7 C)  SpO2: 96% 98%  Below range will reduce amlodipine  to 5mg    12/10- BP doing better- 137/74 this AM- con't new regimen 11. Dysphagia: Continue D1, nectar liquids. Recheck  BMET in am.             --does not like current diet. Discussed dysphagia and need for modified diet.   12/10- pt focusing on meal- con't regimen 12. T2DM: Hgb A1C- 6.0. Continue to hold metformin             --monitor BS ac/hs and use SSI for now.  CBG (last 3)  Recent Labs    12/24/20 2120 12/25/20 0552 12/25/20 1144  GLUCAP 179* 121* 135*   12/10- CBGs basically controlled- adequate for hospital- con't regimen  13. Chronic intermittent insomnia: Will resume Melatonin (used 10 mg prn PTA) 14. Chronic congeseted cough: For 5-6 years and felt to be due to allergies. --Will add Claritin.    15.  Lethargy labs ok, afebrile, no sedating meds   12/10- staying awake so far this AM- will d/w nursing.    LOS: 3 days A FACE TO FACE EVALUATION WAS PERFORMED  Danyell Awbrey 12/25/2020, 2:22 PM

## 2020-12-25 NOTE — Progress Notes (Signed)
Physical Therapy Session Note  Patient Details  Name: Alicia Guerrero MRN: [id number] Date of Birth: [birth year]  Today's Date: 12/25/2020 PT Individual Time: 0300-9233 PT Individual Time Calculation (min): 63 min   and  Today's Date: 12/25/2020 PT Missed Time: 12 Minutes Missed Time Reason: Patient fatigue  Short Term Goals: Week 1:  PT Short Term Goal 1 (Week 1): Pt will perform supine<>sit with max assist PT Short Term Goal 2 (Week 1): Pt will tolerate upright sitting on EOB/EOM for at least 2 minutes with no more than mod assist PT Short Term Goal 3 (Week 1): Pt will perform bed<>chair transfers with +2 max assist using  LRAD PT Short Term Goal 4 (Week 1): Pt will perform sit<>stands using LRAD with +2 max assist  Skilled Therapeutic Interventions/Progress Updates:    Pt received supine in bed with her son, Alicia Guerrero, present and pt agreeable to therapy session. Pt much more alert, awake, and overall appears to have more energy compared to when seen by this therapist on initial eval - pt's son reports she had a long break between therapies and took a nap. Educated pt's son on importance of maintain L UE elevated on pillows for edema management as well as importance of roll schedule for pressure relief of sacrum.  Supine>sitting R EOB, HOB partially elevated and using bedrail, via logroll technique to increase pt independence with heavy max assist for trunk upright and B LE management.  Sitting EOB pt requires max progressed to total assist for trunk control due to strong R posterior lean/LOB - engaged pt in reaching task to promote L anterior weight shift but she was not able to sustain midline but instead repeatedly had strong R posterior lean. Once placed STEDY in front of her she was able to compensate by pulling on bar with R hand to keep trunk upright.   Sit>stand EOB>stedy with +2 mod assist for lifting to stand, managing L UE support on/off stedy bar, and trunk control due to minor R  lean but much improved compared to in sitting. In high perched position in stedy placed pt in front of mirror for visual feedback for midline orientation due to mild R lean - improvement. Stedy transfer to TIS w/c.  Transported to/from gym in w/c for time management and energy conservation.   Provided pt with RW and L hand orthosis. Sit>stand TIS w/c>RW with +2 mod assist for lifting to stand and balance due to posterior lean, but once repositioned feet under her progressed to static standing with +2 min assist for balance and safety - max cuing for hand placement pushing up with R UE - mirror feedback for improved upright posture and midline orientation.  Gait training 47ft +60ft using RW with +2 mod assist for balance and AD management with +3 assist for close w/c follow as pt needs to sit quickly when fatigued - manual facilitation for R/L weight shifting onto stance limb with step-by-step verbal cuing to initiate step - stays in crouched/partial squat posture with knees partially flexed requiring repeated cuing to improve upright posture - no instances of knee buckling though with slight flexion that worsens with fatigue - maintains excessive thoracic kyphosis with forward trunk flexion - able to advance each LE without assist during swing phase though tends to take too large of steps.  Pt reporting significant fatigue and requests to return to bed. Transported back to room. Sit>stand w/c>stedy with mod assist of 1 and +2 to assist with L UE management. Stedy transfer  to EOB. Sit>supine with max assist for trunk descent and B LE management onto bed via reverse logroll technique. Reinforced earlier education on rolling schedule with pt agreeable to R sidelying at this time - therapeutically repositioned on R side with pillows for pressure relief. Pt left in bed with needs in reach, bed alarm on, and her son present. Missed 12 minutes of skilled physical therapy.  Therapy Documentation Precautions:   Precautions Precautions: Fall, Other (comment) Precaution Comments: L side hemiparesis, left neglect, SBP goal 120-140 range Restrictions Weight Bearing Restrictions: No   Pain:  No reports of pain throughout session.   Therapy/Group: Individual Therapy  Tawana Scale , PT, DPT, NCS, CSRS  12/25/2020, 3:06 PM

## 2020-12-25 NOTE — Progress Notes (Signed)
Occupational Therapy Session Note  Patient Details  Name: Alicia Guerrero MRN: [id number] Date of Birth: 1935-04-29  Today's Date: 12/25/2020 OT Individual Time: 1004-1057 OT Individual Time Calculation (min): 53 min    Short Term Goals: Week 1:  OT Short Term Goal 1 (Week 1): Pt will complete static sitting balance for 5 mins in preparation for selfcare tasks with no more than min assist. OT Short Term Goal 2 (Week 1): Pt will complete UB bathing with min assist in unsupported sitting. OT Short Term Goal 3 (Week 1): Pt will complete LB bathing sit to stand with max assist for two consecutive sessions. OT Short Term Goal 4 (Week 1): Pt will complete toilet transfer squat/stand pivot with mod assist to the drop arm commode.  Skilled Therapeutic Interventions/Progress Updates:    Pt received semi-reclined in bed, no c/o pain, agreeable to therapy. Session focus on self-care retraining, activity tolerance, static sitting balance, transfer retraining, midline orientation in prep for improved ADL/IADL/func mobility performance + decreased caregiver burden. Came to sitting EOB with max A to progress L hemi body off bed and to lift trunk. Frequent poster and R LOB required heavy max A to maintain static sitting balance. Total A to don B shoes. Use of stedy to keep B feet supported on ground and to block knees. Pt progressed to no RUE support on rail and intermittent min A to prevent posterior lean for static sitting balance with increased time and facilitation of upright posterior, anterior pelvic tilt. Max A to doff/don new night gown via hemi-technique due to poor static sitting balance.  Completed 2x10 of the following: B shoulder shrugs, scapular retraction, elbow flexion/extension with overall max A to facilitate correct technique.  Sit to stand in stedy with overall min A to maintain LUE on bar and use of elevated surface, +2 present to assist. Completed 1x5 sit to stands from perched position  with CGA and min A to facilitate upright posture. Stedy transfer > TIS.  Pt left tilted back in TIS with full lap tray placed with safety belt alarm engaged, call bell in reach, and all immediate needs met.    Therapy Documentation Precautions:  Precautions Precautions: Fall, Other (comment) Precaution Comments: L side hemiparesis, left neglect Restrictions Weight Bearing Restrictions: No  Pain: no c/o   ADL: See Care Tool for more details.  Therapy/Group: Individual Therapy  Volanda Napoleon MS, OTR/L   12/25/2020, 6:54 AM

## 2020-12-25 NOTE — Progress Notes (Signed)
Physical Therapy Session Note  Patient Details  Name: Alicia Guerrero MRN: [id number] Date of Birth: 11-25-1935  Today's Date: 12/24/2020 PT Individual Time:  8546-2703  PT Individual Time Calculation (min): 57 min    Short Term Goals: Week 1:  PT Short Term Goal 1 (Week 1): Pt will perform supine<>sit with max assist PT Short Term Goal 2 (Week 1): Pt will tolerate upright sitting on EOB/EOM for at least 2 minutes with no more than mod assist PT Short Term Goal 3 (Week 1): Pt will perform bed<>chair transfers with +2 max assist using  LRAD PT Short Term Goal 4 (Week 1): Pt will perform sit<>stands using LRAD with +2 max assist  Skilled Therapeutic Interventions/Progress Updates:  Patient seated upright in TIS w/c on entrance to room. Full lap tray in place. Pt paperwork in her R hand on tray. Pt hesitant to release paperwork as she may "need it" and doesn't want to lose it. Ensured pt that paperwork would be safe on bed while pt is in therapy. Patient alert and agreeable to PT session. Pt continually relates throughout session that LUE and LLE are "paralyzed" with any request to move them. Pt educated re: continued need to attempt with LUE, and that pt does have movement in LE. So pt is not paralyzed. Educated re: feeling of weakness/ heaviness as one that will improve with continued effort.   Patient with no pain complaint throughout session. But does c/o fatigue and desire to return to bed.   Therapeutic Activity: Bed Mobility: Patient performed sit--> supine at end of session with Mod/ MaxA. VC/ tc required for technique and initiation. Transfers: Patient performed sit<>stand transfers throughout session using STEDY initially for assessment and training. Pt educated in [place] and use prior to attempt. Pt requires ModA +2 to complete rise to stand that fades to MaxA +2 for complete lift to clear seat perch pads. Provided vc/ tc throughout for technique/ effort.  Neuromuscular Re-ed: NMR  facilitated during session with focus onstanding posture/ balance, proprioception, strength. Pt guided in sit<>stand practice in // bars. Pt requires block to L knee to prevent buckling. Pt with difficulty reaching upright posture initially and improves with encouragement and education. Requires encouragement to remain standing. Second standing bout longest at 49min. NMR performed for improvements in motor control and coordination, balance, sequencing, judgement, and self confidence/ efficacy in performing all aspects of mobility at highest level of independence.   STEDY used for transfer back to bed from w/c. Patient supine  in bed at end of session with brakes locked, bed alarm set, and all needs within reach.     Therapy Documentation Precautions:  Precautions Precautions: Fall, Other (comment) Precaution Comments: L side hemiparesis, left neglect Restrictions Weight Bearing Restrictions: No General:   Vital Signs:   Pain:  No pain complaint this session.   Therapy/Group: Individual Therapy  Alger Simons PT, DPT 12/24/2020, 4:11 PM

## 2020-12-25 NOTE — Plan of Care (Signed)
  Problem: Consults Goal: RH STROKE PATIENT EDUCATION Description: See Patient Education module for education specifics  Outcome: Progressing   Problem: RH BOWEL ELIMINATION Goal: RH STG MANAGE BOWEL WITH ASSISTANCE Description: STG Manage Bowel with mod I Assistance. Outcome: Progressing Goal: RH STG MANAGE BOWEL W/MEDICATION W/ASSISTANCE Description: STG Manage Bowel with Medication with mod I Assistance. Outcome: Progressing   Problem: RH BLADDER ELIMINATION Goal: RH STG MANAGE BLADDER WITH ASSISTANCE Description: STG Manage Bladder With out Assistance Outcome: Progressing   Problem: RH SAFETY Goal: RH STG ADHERE TO SAFETY PRECAUTIONS W/ASSISTANCE/DEVICE Description: STG Adhere to Safety Precautions With cues Assistance/Device. Outcome: Progressing   Problem: RH KNOWLEDGE DEFICIT Goal: RH STG INCREASE KNOWLEDGE OF HYPERTENSION Description: Patient and son will be able to manage HTN with medications and dietary modifications using handouts and educational resources independently Outcome: Progressing Goal: RH STG INCREASE KNOWLEDGE OF DYSPHAGIA/FLUID INTAKE Description: Patient and son will be able to manage dysphagia, medications and dietary modifications using handouts and educational resources independently Outcome: Progressing Goal: RH STG INCREASE KNOWLEGDE OF HYPERLIPIDEMIA Description: Patient and son will be able to manage HLD with medications and dietary modifications using handouts and educational resources independently Outcome: Progressing Goal: RH STG INCREASE KNOWLEDGE OF STROKE PROPHYLAXIS Description: Patient and son will be able to manage secondary stroke risks with medications and dietary modifications using handouts and educational resources independently Outcome: Progressing   Problem: RH Vision Goal: RH LTG Vision (Specify) Outcome: Progressing

## 2020-12-26 DIAGNOSIS — R410 Disorientation, unspecified: Secondary | ICD-10-CM

## 2020-12-26 LAB — GLUCOSE, CAPILLARY
Glucose-Capillary: 134 mg/dL — ABNORMAL HIGH (ref 70–99)
Glucose-Capillary: 141 mg/dL — ABNORMAL HIGH (ref 70–99)
Glucose-Capillary: 188 mg/dL — ABNORMAL HIGH (ref 70–99)
Glucose-Capillary: 144 mg/dL — ABNORMAL HIGH (ref 70–99)

## 2020-12-26 NOTE — Plan of Care (Signed)
  Problem: Consults Goal: RH STROKE PATIENT EDUCATION Description: See Patient Education module for education specifics  Outcome: Progressing   Problem: RH BOWEL ELIMINATION Goal: RH STG MANAGE BOWEL WITH ASSISTANCE Description: STG Manage Bowel with mod I Assistance. Outcome: Progressing Goal: RH STG MANAGE BOWEL W/MEDICATION W/ASSISTANCE Description: STG Manage Bowel with Medication with mod I Assistance. Outcome: Progressing   Problem: RH BLADDER ELIMINATION Goal: RH STG MANAGE BLADDER WITH ASSISTANCE Description: STG Manage Bladder With out Assistance Outcome: Progressing   Problem: RH SAFETY Goal: RH STG ADHERE TO SAFETY PRECAUTIONS W/ASSISTANCE/DEVICE Description: STG Adhere to Safety Precautions With cues Assistance/Device. Outcome: Progressing   Problem: RH KNOWLEDGE DEFICIT Goal: RH STG INCREASE KNOWLEDGE OF HYPERTENSION Description: Patient and son will be able to manage HTN with medications and dietary modifications using handouts and educational resources independently Outcome: Progressing Goal: RH STG INCREASE KNOWLEDGE OF DYSPHAGIA/FLUID INTAKE Description: Patient and son will be able to manage dysphagia, medications and dietary modifications using handouts and educational resources independently Outcome: Progressing Goal: RH STG INCREASE KNOWLEGDE OF HYPERLIPIDEMIA Description: Patient and son will be able to manage HLD with medications and dietary modifications using handouts and educational resources independently Outcome: Progressing Goal: RH STG INCREASE KNOWLEDGE OF STROKE PROPHYLAXIS Description: Patient and son will be able to manage secondary stroke risks with medications and dietary modifications using handouts and educational resources independently Outcome: Progressing   Problem: RH Vision Goal: RH LTG Vision (Specify) Outcome: Progressing

## 2020-12-26 NOTE — Progress Notes (Signed)
PROGRESS NOTE   Subjective/Complaints:  Pt reports she's confused- doesn't remember me from yesterday.  Feels her confusion "about the same".   ROS-  Limited by cognition Objective:   No results found. No results for input(s): WBC, HGB, HCT, PLT in the last 72 hours.  No results for input(s): NA, K, CL, CO2, GLUCOSE, BUN, CREATININE, CALCIUM in the last 72 hours.  No intake or output data in the 24 hours ending 12/26/20 0850    Pressure Injury 12/22/20 Coccyx Left Stage 1 -  Intact skin with non-blanchable redness of a localized area usually over a bony prominence. (Active)  12/22/20 1647  Location: Coccyx  Location Orientation: Left  Staging: Stage 1 -  Intact skin with non-blanchable redness of a localized area usually over a bony prominence.  Wound Description (Comments):   Present on Admission: Yes    Physical Exam: Vital Signs Blood pressure (!) 147/71, pulse 62, temperature 97.6 F (36.4 C), temperature source Oral, resp. rate 18, height 5\' 3"  (1.6 m), weight 64.5 kg, SpO2 98 %.     General: awake, alert, appropriate, laying supine in bed;  NAD HENT: conjugate gaze; oropharynx moist CV: regular rate; no JVD Pulmonary: CTA B/L; no W/R/R- good air movement GI: soft, NT, ND, (+)BS Psychiatric: c/o confusion' decreased spontaneous speech Neurological: Ox2- knows 2022 and at hospital with cues.   Skin: No evidence of breakdown, no evidence of rash Neurologic: Oriented to person only , "school, February" Cranial nerves II through XII intact, motor strength is 5/5 in Right deltoid, bicep, tricep, grip, hip flexor, knee extensors, ankle dorsiflexor and plantar flexor, 2- Left delt bic , tri, grip, 3- L HF, KE ADF Sensory exam normal sensation to light touch and proprioception in bilateral upper and lower extremities Cerebellar exam normal finger to nose to finger as well as heel to shin in bilateral upper and lower  extremities Musculoskeletal: Full range of motion in all 4 extremities. No joint swelling    Assessment/Plan: 1. Functional deficits which require 3+ hours per day of interdisciplinary therapy in a comprehensive inpatient rehab setting. Physiatrist is providing close team supervision and 24 hour management of active medical problems listed below. Physiatrist and rehab team continue to assess barriers to discharge/monitor patient progress toward functional and medical goals  Care Tool:  Bathing    Body parts bathed by patient: Chest, Abdomen, Right upper leg, Left upper leg, Face   Body parts bathed by helper: Buttocks, Front perineal area, Left lower leg, Right lower leg, Right arm, Left arm, Chest, Abdomen     Bathing assist Assist Level: 2 Helpers     Upper Body Dressing/Undressing Upper body dressing   What is the patient wearing?: Pull over shirt    Upper body assist Assist Level: Maximal Assistance - Patient 25 - 49%    Lower Body Dressing/Undressing Lower body dressing      What is the patient wearing?: Pants     Lower body assist Assist for lower body dressing: Total Assistance - Patient < 25% (supine rolling)     Toileting Toileting    Toileting assist Assist for toileting: Total Assistance - Patient < 25%  Transfers Chair/bed transfer  Transfers assist  Chair/bed transfer activity did not occur: Safety/medical concerns  Chair/bed transfer assist level: Dependent - mechanical lift (STEDY)     Locomotion Ambulation   Ambulation assist   Ambulation activity did not occur: Safety/medical concerns  Assist level: 2 helpers (+2 mod A and +3 w/c follow) Assistive device: Other (comment) (RW & L hand orthosis) Max distance: 45ft   Walk 10 feet activity   Assist  Walk 10 feet activity did not occur: Safety/medical concerns        Walk 50 feet activity   Assist Walk 50 feet with 2 turns activity did not occur: Safety/medical concerns          Walk 150 feet activity   Assist Walk 150 feet activity did not occur: Safety/medical concerns         Walk 10 feet on uneven surface  activity   Assist Walk 10 feet on uneven surfaces activity did not occur: Safety/medical concerns         Wheelchair     Assist Is the patient using a wheelchair?: No             Wheelchair 50 feet with 2 turns activity    Assist            Wheelchair 150 feet activity     Assist          Blood pressure (!) 147/71, pulse 62, temperature 97.6 F (36.4 C), temperature source Oral, resp. rate 18, height 5\' 3"  (1.6 m), weight 64.5 kg, SpO2 98 %.  Medical Problem List and Plan: 1. Functional deficits secondary to posterior circulation stroke             -patient may shower             -ELOS/Goals: 10-14 days S             -Continue CIR- PT, OT and SLP  2.  Antithrombotics: -DVT/anticoagulation:  Pharmaceutical: Other (comment)--Eliquis             -antiplatelet therapy: ASA added  3. Pain Management: Tylenol prn.  4. Mood: LCSW to follow for evaluation and support.              -antipsychotic agents: N/A 5. Neuropsych: This patient is not fully capable of making decisions on capable own behalf. 6. Skin/Wound Care: Routine pressure relief measures 7. Fluids/Electrolytes/Nutrition:  N/A 8. CAF/ICM s/p ICD: Monitor HR TID. Continue Eliquis and bisoprolol.             --monitor for symptoms with increase in activity.   12/10- HR is doing OK- 60s- con't regimen 9. H/o CVA with L-HP: Was independent without AD PTA. 10. HTN: Monitor BP TID--SBP goal 120-140 range per neur due to MCA/PCA stenosis             --continue bisoprolol and Avapro.  Vitals:   12/25/20 1948 12/26/20 0603  BP: 138/62 (!) 147/71  Pulse: 64 62  Resp: 16 18  Temp: 98.4 F (36.9 C) 97.6 F (36.4 C)  SpO2: 97% 98%  Below range will reduce amlodipine to 5mg    12/11- BP low 101B systolic- con't regimen for now 11. Dysphagia:  Continue D1, nectar liquids. Recheck BMET in am.             --does not like current diet. Discussed dysphagia and need for modified diet.   12/10- pt focusing on meal- con't regimen 12. T2DM: Hgb A1C- 6.0. Continue  to hold metformin             --monitor BS ac/hs and use SSI for now.  CBG (last 3)  Recent Labs    12/25/20 1644 12/25/20 2109 12/26/20 0641  GLUCAP 131* 185* 134*   12/10- CBGs basically controlled- adequ12/11- CBGs adequate control- only 1 value 180s- otherwise 130s- con't regimen 13. Chronic intermittent insomnia: Will resume Melatonin (used 10 mg prn PTA) 14. Chronic congeseted cough: For 5-6 years and felt to be due to allergies. --Will add Claritin.    15.  Lethargy/confusion  labs ok, afebrile, no sedating meds   12/10- staying awake so far this AM- will d/w nursing. 12/11- pt c/o confusion- will check U/A and Cx - other labs in AM   LOS: 4 days A FACE TO FACE EVALUATION WAS PERFORMED  Angelie Kram 12/26/2020, 8:50 AM

## 2020-12-27 LAB — URINALYSIS, ROUTINE W REFLEX MICROSCOPIC
Bilirubin Urine: NEGATIVE
Glucose, UA: NEGATIVE mg/dL
Hgb urine dipstick: NEGATIVE
Ketones, ur: NEGATIVE mg/dL
Leukocytes,Ua: NEGATIVE
Nitrite: NEGATIVE
Protein, ur: NEGATIVE mg/dL
Specific Gravity, Urine: 1.018 (ref 1.005–1.030)
pH: 5 (ref 5.0–8.0)

## 2020-12-27 LAB — CBC
HCT: 43.4 % (ref 36.0–46.0)
Hemoglobin: 14.1 g/dL (ref 12.0–15.0)
MCH: 27.3 pg (ref 26.0–34.0)
MCHC: 32.5 g/dL (ref 30.0–36.0)
MCV: 83.9 fL (ref 80.0–100.0)
Platelets: 371 10*3/uL (ref 150–400)
RBC: 5.17 MIL/uL — ABNORMAL HIGH (ref 3.87–5.11)
RDW: 12.9 % (ref 11.5–15.5)
WBC: 12.4 10*3/uL — ABNORMAL HIGH (ref 4.0–10.5)
nRBC: 0 % (ref 0.0–0.2)

## 2020-12-27 LAB — GLUCOSE, CAPILLARY
Glucose-Capillary: 133 mg/dL — ABNORMAL HIGH (ref 70–99)
Glucose-Capillary: 120 mg/dL — ABNORMAL HIGH (ref 70–99)
Glucose-Capillary: 128 mg/dL — ABNORMAL HIGH (ref 70–99)
Glucose-Capillary: 208 mg/dL — ABNORMAL HIGH (ref 70–99)

## 2020-12-27 LAB — BASIC METABOLIC PANEL
Anion gap: 10 (ref 5–15)
BUN: 21 mg/dL (ref 8–23)
CO2: 25 mmol/L (ref 22–32)
Calcium: 9.5 mg/dL (ref 8.9–10.3)
Chloride: 103 mmol/L (ref 98–111)
Creatinine, Ser: 0.77 mg/dL (ref 0.44–1.00)
GFR, Estimated: >60 mL/min (ref >60)
Glucose, Bld: 134 mg/dL — ABNORMAL HIGH (ref 70–99)
Potassium: 3.6 mmol/L (ref 3.5–5.1)
Sodium: 138 mmol/L (ref 135–145)

## 2020-12-27 LAB — URINE CULTURE: Culture: NO GROWTH

## 2020-12-27 NOTE — Progress Notes (Signed)
Physical Therapy Session Note  Patient Details  Name: Alicia Guerrero MRN: [id number] Date of Birth: 1935/05/20  Today's Date: 12/27/2020 PT Individual Time: 1300-1356 PT Individual Time Calculation (min): 56 min   Short Term Goals: Week 1:  PT Short Term Goal 1 (Week 1): Pt will perform supine<>sit with max assist PT Short Term Goal 2 (Week 1): Pt will tolerate upright sitting on EOB/EOM for at least 2 minutes with no more than mod assist PT Short Term Goal 3 (Week 1): Pt will perform bed<>chair transfers with +2 max assist using  LRAD PT Short Term Goal 4 (Week 1): Pt will perform sit<>stands using LRAD with +2 max assist  Skilled Therapeutic Interventions/Progress Updates:    Patient received sitting up in bed, agreeable to PT. She denies pain. Patient stating "I'm confused" upon PT entering room. She was orient to place, situation and self, but responded "2020" when asked what year it was. PT reorienting patient. MaxA to move supine > sit with cues to attend to L UE. Patient receiving lunch tray late and requesting to eat lunch. Sitting edge of bed, PT providing full supervision for PO intake. Patient requiring CGA-ModA to maintain static sitting sitting balance with tendency toward R posterior lean. Patient able to correct with cues and intermittent approximation to L LE for L anterior weight shift. Patient consuming ~25% of her meal with intermittent cues for smaller bites and complete swallow before eating more. Patient transferring to wc via squat pivot with ModA x2. PT transporting patient in wc to therapy gym for time management and energy conservation. Standing dynamic balance with EVA walker used reaching R anterior to retrieve clothes pins from line and placing to the L on table for weight shift L. Mod verbal cues + MinA tactile cues for L knee extension in standing. Multimodal cues for appropriate activation of posterior chain for upright posture. Patient with safe and appropriate  weight shifts L and R with no overt pushing or lateral leaning. Patient requesting to return to bed. ModA x2 squat pivot with MaxA return to supine. Bed alarm on, call light within reach.   Therapy Documentation Precautions:  Precautions Precautions: Fall, Other (comment) Precaution Comments: L side hemiparesis, left neglect, SBP goal 120-140 range Restrictions Weight Bearing Restrictions: No    Therapy/Group: Individual Therapy  Karoline Caldwell, PT, DPT, CBIS  12/27/2020, 7:41 AM

## 2020-12-27 NOTE — Progress Notes (Signed)
Occupational Therapy Session Note  Patient Details  Name: Alicia Guerrero MRN: [id number] Date of Birth: [birth year]  Today's Date: 12/27/2020 OT Individual Time: 3559-7416 OT Individual Time Calculation (min): 54 min    Short Term Goals: Week 1:  OT Short Term Goal 1 (Week 1): Pt will complete static sitting balance for 5 mins in preparation for selfcare tasks with no more than min assist. OT Short Term Goal 2 (Week 1): Pt will complete UB bathing with min assist in unsupported sitting. OT Short Term Goal 3 (Week 1): Pt will complete LB bathing sit to stand with max assist for two consecutive sessions. OT Short Term Goal 4 (Week 1): Pt will complete toilet transfer squat/stand pivot with mod assist to the drop arm commode.  Skilled Therapeutic Interventions/Progress Updates:    Pt in bed to start with reports that her left hand has been hurting her.  After closer examination, she demonstrates increased pain with wrist extension.  She was able to demonstrate some active grasp with trace extension but also has history of arthritis which is evident as well.  She was able to complete supine to sit EOB with mod assist on the left side of the bed.  Once sitting, she was able to maintain balance with overall mod assist.  Increased posterior pelvic tilt while sitting with frequent posterior lean while working on bathing tasks.  Incorporated the LUE at a max assist level for holding items to be opened such as the soap, as well as for using the LUE to wash the right shoulder and arm.  Max assist was needed for donning a pullover shirt.  She then washed her legs with min assist to maintain sitting balance and max assist to incorporate the LUE to wash the outside of the RLE.  She was able to complete donning pull on pants at total +2 (pt 25%) sit to stand.  She was able to stand after this with use of the Stedy and max assist for transfer to the tilt in space wheelchair.  Therapist then completed donning the  left resting hand splint with total assist to check fit and educated pt's nurse on proper wearing as well.  Handouts taped up in her room with regards to wearing splint at night as well as a handout on LUE and LLE positioning when in bed.  She was left sitting up in the tilt in space wheelchair with the call button and phone in reach and safety alarm belt in place.    Therapy Documentation Precautions:  Precautions Precautions: Fall, Other (comment) Precaution Comments: L side hemiparesis, left neglect, SBP goal 120-140 range Restrictions Weight Bearing Restrictions: No  Pain: Pain Assessment Pain Scale: Faces Faces Pain Scale: Hurts a little bit Pain Type: Acute pain Pain Location: Wrist Pain Descriptors / Indicators: Discomfort Pain Onset: With Activity Pain Intervention(s): Repositioned ADL: See Care Tool Section for some details of mobility and selfcare   Therapy/Group: Individual Therapy  Kayelee Herbig OTR/L 12/27/2020, 12:14 PM

## 2020-12-27 NOTE — Progress Notes (Signed)
PROGRESS NOTE   Subjective/Complaints:  Pt brighter, oriented x 3 today  Elevated WBC afebrile, UCx pending  ROS-  Limited by cognition Objective:   No results found. Recent Labs    12/27/20 0629  WBC 12.4*  HGB 14.1  HCT 43.4  PLT 371    Recent Labs    12/27/20 0629  NA 138  K 3.6  CL 103  CO2 25  GLUCOSE 134*  BUN 21  CREATININE 0.77  CALCIUM 9.5     Intake/Output Summary (Last 24 hours) at 12/27/2020 0830 Last data filed at 12/27/2020 0400 Gross per 24 hour  Intake 360 ml  Output 1 ml  Net 359 ml      Pressure Injury 12/22/20 Coccyx Left Stage 1 -  Intact skin with non-blanchable redness of a localized area usually over a bony prominence. (Active)  12/22/20 1647  Location: Coccyx  Location Orientation: Left  Staging: Stage 1 -  Intact skin with non-blanchable redness of a localized area usually over a bony prominence.  Wound Description (Comments):   Present on Admission: Yes    Physical Exam: Vital Signs Blood pressure (!) 142/71, pulse 63, temperature 97.7 F (36.5 C), temperature source Oral, resp. rate 18, height 5\' 3"  (1.6 m), weight 64.5 kg, SpO2 94 %.   General: No acute distress Mood and affect are appropriate Heart: Regular rate and rhythm no rubs murmurs or extra sounds Lungs: Clear to auscultation, breathing unlabored, no rales or wheezes Abdomen: Positive bowel sounds, soft nontender to palpation, nondistended Extremities: No clubbing, cyanosis, or edema  Skin: No evidence of breakdown, no evidence of rash Neurologic: Oriented to person , Zacarias Pontes, Month and situation (Stroke)  Cranial nerves II through XII intact, motor strength is 5/5 in Right deltoid, bicep, tricep, grip, hip flexor, knee extensors, ankle dorsiflexor and plantar flexor, 2- Left delt bic , tri, grip, 3- L HF, KE ADF Sensory exam normal sensation to light touch and proprioception in bilateral upper and lower  extremities Cerebellar exam normal finger to nose to finger as well as heel to shin in bilateral upper and lower extremities Musculoskeletal: Full range of motion in all 4 extremities. No joint swelling    Assessment/Plan: 1. Functional deficits which require 3+ hours per day of interdisciplinary therapy in a comprehensive inpatient rehab setting. Physiatrist is providing close team supervision and 24 hour management of active medical problems listed below. Physiatrist and rehab team continue to assess barriers to discharge/monitor patient progress toward functional and medical goals  Care Tool:  Bathing    Body parts bathed by patient: Chest, Abdomen, Right upper leg, Left upper leg, Face   Body parts bathed by helper: Buttocks, Front perineal area, Left lower leg, Right lower leg, Right arm, Left arm, Chest, Abdomen     Bathing assist Assist Level: 2 Helpers     Upper Body Dressing/Undressing Upper body dressing   What is the patient wearing?: Pull over shirt    Upper body assist Assist Level: Maximal Assistance - Patient 25 - 49%    Lower Body Dressing/Undressing Lower body dressing      What is the patient wearing?: Pants     Lower  body assist Assist for lower body dressing: Total Assistance - Patient < 25% (supine rolling)     Toileting Toileting    Toileting assist Assist for toileting: Total Assistance - Patient < 25%     Transfers Chair/bed transfer  Transfers assist  Chair/bed transfer activity did not occur: Safety/medical concerns  Chair/bed transfer assist level: Dependent - mechanical lift (STEDY)     Locomotion Ambulation   Ambulation assist   Ambulation activity did not occur: Safety/medical concerns  Assist level: 2 helpers (+2 mod A and +3 w/c follow) Assistive device: Other (comment) (RW & L hand orthosis) Max distance: 86ft   Walk 10 feet activity   Assist  Walk 10 feet activity did not occur: Safety/medical concerns         Walk 50 feet activity   Assist Walk 50 feet with 2 turns activity did not occur: Safety/medical concerns         Walk 150 feet activity   Assist Walk 150 feet activity did not occur: Safety/medical concerns         Walk 10 feet on uneven surface  activity   Assist Walk 10 feet on uneven surfaces activity did not occur: Safety/medical concerns         Wheelchair     Assist Is the patient using a wheelchair?: No             Wheelchair 50 feet with 2 turns activity    Assist            Wheelchair 150 feet activity     Assist          Blood pressure (!) 142/71, pulse 63, temperature 97.7 F (36.5 C), temperature source Oral, resp. rate 18, height 5\' 3"  (1.6 m), weight 64.5 kg, SpO2 94 %.  Medical Problem List and Plan: 1. Functional deficits secondary to posterior circulation stroke             -patient may shower             -ELOS/Goals: 10-14 days S             -Continue CIR- PT, OT and SLP  2.  Antithrombotics: -DVT/anticoagulation:  Pharmaceutical: Other (comment)--Eliquis             -antiplatelet therapy: ASA added  3. Pain Management: Tylenol prn.  4. Mood: LCSW to follow for evaluation and support.              -antipsychotic agents: N/A 5. Neuropsych: This patient is not fully capable of making decisions on capable own behalf. 6. Skin/Wound Care: Routine pressure relief measures 7. Fluids/Electrolytes/Nutrition:  N/A 8. CAF/ICM s/p ICD: Monitor HR TID. Continue Eliquis and bisoprolol.             --monitor for symptoms with increase in activity.   12/10- HR is doing OK- 60s- con't regimen 9. H/o CVA with L-HP: Was independent without AD PTA. 10. HTN: Monitor BP TID--SBP goal 120-140 range per neur due to MCA/PCA stenosis             --continue bisoprolol and Avapro.  Vitals:   12/26/20 2123 12/27/20 0423  BP: (!) 146/75 (!) 142/71  Pulse: 66 63  Resp: 18 18  Temp: 98.4 F (36.9 C) 97.7 F (36.5 C)  SpO2: 96% 94%   Below range will reduce amlodipine to 5mg    12/11- BP low 528U systolic- con't regimen for now 11. Dysphagia: Continue D1, nectar liquids. Recheck BMET in  am.             --does not like current diet. Discussed dysphagia and need for modified diet.   12/10- pt focusing on meal- con't regimen 12. T2DM: Hgb A1C- 6.0. Continue to hold metformin             --monitor BS ac/hs and use SSI for now.  CBG (last 3)  Recent Labs    12/26/20 1627 12/26/20 2124 12/27/20 0609  GLUCAP 188* 144* 133*    12/12 fair control 13. Chronic intermittent insomnia: Will resume Melatonin (used 10 mg prn PTA) 14. Chronic congeseted cough: For 5-6 years and felt to be due to allergies. --Will add Claritin.    15.  Lethargy/confusion  labs ok, afebrile, no sedating meds   12/10- staying awake so far this AM- will d/w nursing. 12/11- pt c/o confusion- will check U/A and Cx - other labs in AM   LOS: 5 days A FACE TO FACE EVALUATION WAS PERFORMED  Charlett Blake 12/27/2020, 8:30 AM

## 2020-12-27 NOTE — Progress Notes (Signed)
Physical Therapy Session Note  Patient Details  Name: Alicia Guerrero MRN: [id number] Date of Birth: Apr 13, 1935  Today's Date: 12/27/2020 PT Individual Time: 0321-2248 PT Individual Time Calculation (min): 55 min   Short Term Goals: Week 1:  PT Short Term Goal 1 (Week 1): Pt will perform supine<>sit with max assist PT Short Term Goal 2 (Week 1): Pt will tolerate upright sitting on EOB/EOM for at least 2 minutes with no more than mod assist PT Short Term Goal 3 (Week 1): Pt will perform bed<>chair transfers with +2 max assist using  LRAD PT Short Term Goal 4 (Week 1): Pt will perform sit<>stands using LRAD with +2 max assist  Skilled Therapeutic Interventions/Progress Updates:    Pt received seated in bed, agreeable to PT session. No complaints of pain. Supine to sitting EOB with max A for trunk control and assist scooting hips to EOB. Pt with posterior and R lateral lean in sitting, requires mod to max A for sitting balance. Pt requests to use the bathroom. Sit to stand with min A to stedy, stedy transfer to toilet. Pt is dependent for clothing management, setup A for pericare. Stedy transfer to [facility] chair. Dependent transport via [place] chair to/from therapy gym. Sit to stand in // bars with min A throughout session. Ambulation 2 x 5 ft in // bars with mod A for balance and close w/c follow for safety. Pt exhibits intermittent posterior lean in standing vs flexed hips/trunk/knees in standing. Pt able to correct posture with cueing. Standing alt L/R 1" step-taps 3 x 5 reps to fatigue. Pt agreeable to remain seated in TIS chair at end of session, needs in reach, quick release belt and chair alarm in place.  Therapy Documentation Precautions:  Precautions Precautions: Fall, Other (comment) Precaution Comments: L side hemiparesis, left neglect, SBP goal 120-140 range Restrictions Weight Bearing Restrictions: No     Therapy/Group: Individual Therapy   Excell Seltzer, PT, DPT,  CSRS  12/27/2020, 5:07 PM

## 2020-12-28 LAB — GLUCOSE, CAPILLARY
Glucose-Capillary: 125 mg/dL — ABNORMAL HIGH (ref 70–99)
Glucose-Capillary: 184 mg/dL — ABNORMAL HIGH (ref 70–99)
Glucose-Capillary: 170 mg/dL — ABNORMAL HIGH (ref 70–99)
Glucose-Capillary: 170 mg/dL — ABNORMAL HIGH (ref 70–99)

## 2020-12-28 NOTE — Progress Notes (Signed)
Physical Therapy Session Note  Patient Details  Name: Alicia Guerrero MRN: [id number] Date of Birth: 1935/08/17  Today's Date: 12/28/2020 PT Individual Time: 6387-5643 PT Individual Time Calculation (min): 56 min   Short Term Goals: Week 1:  PT Short Term Goal 1 (Week 1): Pt will perform supine<>sit with max assist PT Short Term Goal 2 (Week 1): Pt will tolerate upright sitting on EOB/EOM for at least 2 minutes with no more than mod assist PT Short Term Goal 3 (Week 1): Pt will perform bed<>chair transfers with +2 max assist using  LRAD PT Short Term Goal 4 (Week 1): Pt will perform sit<>stands using LRAD with +2 max assist  Skilled Therapeutic Interventions/Progress Updates:    Pt received in bed and agreeable to therapy session. Pt recognizes this therapist; however, demos some confusion stating she wants to go back to her room and doesn't understand why "these people" she has never met before have been coming in/out of her room - inquired if this was a new group of nurses for her and comforted her on the fact that she is in fact in her room. Throughout session, pt noted to be hearing people talk in the hallway and this would confuse or disorient her requiring cuing to redirect her attention back on current mobility task.   Reports need to use bathroom.  Supine>sitting R EOB, HOB partially elevated and using bedrail via logroll technique to promote increased independence with heavy mod assist for trunk upright and B LE management off EOB.   Continues to demo R posterior trunk lean in stiting requiring min/mod assist for balance while placing stedy.   Sit>stand from EOB>stedy with mod assist for rising to stand and manual facilitation for L hand placement on/off stedy bar. Stedy transfer on/out bathroom.  Sit<>stand from stedy seat with min/mod assist and min assist for balance due to R posterior lean while standing during total assist LB clothing management and peri-care. Continent of  bladder and bowels.  Transported to/from gym in w/c for time management and energy conservation.   Sit>stand TIS w/c>RW with mod assist for lifting to stand and balance - assist for L hand placement on/off RW orthosis - demos wide base when initiating coming to stand requiring repositioning of feet once in standing to regain her balance and decrease posterior lean.   Gait training 18f, 257f 1063fsing RW with mod assist of 1 for AD management and balance with +2 close w/c follow - demos poor selective attention to the task becoming internally and externally distracted - requires step-by-step verbal cuing to motor plan and execute the next step with tactile cuing/manual facilitation to weight shift onto stance limb - continues to have sustained excessive anterior trunk flexion with slight crouched posture that worsens with increased distance, repeated cuing to maintain upright - primarily achieves step-to pattern with decreased L LE step length compared to R. Fatigues quickly requiring seated rest breaks.   Transported back to room and left tilted back in TIS w/c with needs in reach, full lap tray donned for L UE support, and seat belt alarm on.   Therapy Documentation Precautions:  Precautions Precautions: Fall, Other (comment) Precaution Comments: L side hemiparesis, left neglect, SBP goal 120-140 range Restrictions Weight Bearing Restrictions: No   Pain:  No reports of pain throughout session.    Therapy/Group: Individual Therapy  CarTawana ScalePT, DPT, NCS, CSRS  12/28/2020, 8:06 AM

## 2020-12-28 NOTE — Progress Notes (Signed)
Physical Therapy Session Note  Patient Details  Name: Alicia Guerrero MRN: [id number] Date of Birth: 1935-05-29  Today's Date: 12/28/2020 PT Individual Time: 0801-0827 PT Individual Time Calculation (min): 26 min   Short Term Goals: Week 1:  PT Short Term Goal 1 (Week 1): Pt will perform supine<>sit with max assist PT Short Term Goal 2 (Week 1): Pt will tolerate upright sitting on EOB/EOM for at least 2 minutes with no more than mod assist PT Short Term Goal 3 (Week 1): Pt will perform bed<>chair transfers with +2 max assist using  LRAD PT Short Term Goal 4 (Week 1): Pt will perform sit<>stands using LRAD with +2 max assist  Skilled Therapeutic Interventions/Progress Updates:    Patient received sitting up in bed, agreeable to PT. She denies pain. Patient requesting to finish breakfast. PT providing full supervision. She required Mod cues to finish what was in her mouth before taking another bite of food. Patient also requiring ModA to pre-load spoon as patient had difficulty coordinating use of spoon at times. She may benefit from built up silverware and perhaps a plate guard to allow for greater independence with self feeding. Patient with intermittent pocketing in L cheek requiring cues for tongue sweep to clear. Patient with no overt signs of aspiration noted. Handoff to NT to continue full supervision for PO intake. Bed alarm on, call light within reach.   Therapy Documentation Precautions:  Precautions Precautions: Fall, Other (comment) Precaution Comments: L side hemiparesis, left neglect, SBP goal 120-140 range Restrictions Weight Bearing Restrictions: No    Therapy/Group: Individual Therapy  Karoline Caldwell, PT, DPT, CBIS  12/28/2020, 7:29 AM

## 2020-12-28 NOTE — Progress Notes (Signed)
Occupational Therapy Session Note  Patient Details  Name: Alicia Guerrero MRN: [id number] Date of Birth: 1935-07-11  Today's Date: 12/28/2020 OT Individual Time: 9702-6378 OT Individual Time Calculation (min): 82 min    Short Term Goals: Week 1:  OT Short Term Goal 1 (Week 1): Pt will complete static sitting balance for 5 mins in preparation for selfcare tasks with no more than min assist. OT Short Term Goal 2 (Week 1): Pt will complete UB bathing with min assist in unsupported sitting. OT Short Term Goal 3 (Week 1): Pt will complete LB bathing sit to stand with max assist for two consecutive sessions. OT Short Term Goal 4 (Week 1): Pt will complete toilet transfer squat/stand pivot with mod assist to the drop arm commode.  Skilled Therapeutic Interventions/Progress Updates:    Pt in bed to start session with transfer to the EOB with mod assist.  She was then able to maintain static sitting balance with supervision with focus on donning pants EOB.  She needed max assist for dynamic sitting balance while working on donning pull up pants at max assist level sit to stand.  She then completed stand pivot transfer to the wheelchair on the right with max assist.  Decreased ability to unweight the RLE for stepping backwards to the wheelchair.  Once in the wheelchair worked on self feeding to eat applesauce with the LUE and max assist using a foam grip over the plastic spoon.  Also provided plate guard for use to assist with one handed eating using the RUE with other staff.  Informed pt's NT on use of the plate guard for meals with therapist to assist with use of foam grips for self feeding with the LUE during sessions.  Had her work on [employer] elbow flexion/extension as well as digit flexion/extension, and shoulder flexion/extension 0-90 degrees.  She was able to complete 10 reps for each exercise with mod instructional cueing to maintain visual fixation on the UE while using it.  Therapist also completed  gentle wrist mobilizations and stretching for the LUE secondary to pt reporting increased pain with passive wrist extension.  She reported an improvement in pain after completion of mobilizations and stretching.  Next, had her complete AAROM towel slides on the full lap tray with pt demonstrating ability to adduct the arm across her body with supervision on the table, but needing mod assist to abduct it.  Finished session with pt in the wheelchair with the call button and phone in reach and safety alarm belt in place.  Pt's son in the room at the end of session, explained use of AE with self feeding as well as discussing expectations of further LOS and need for assist at discharge.  He is awaiting D/C date after conference tomorrow.    Therapy Documentation Precautions:  Precautions Precautions: Fall, Other (comment) Precaution Comments: L side hemiparesis, left neglect, SBP goal 120-140 range Restrictions Weight Bearing Restrictions: No  Pain: Pain Assessment Pain Scale: Faces Faces Pain Scale: Hurts a little bit Pain Type: Acute pain Pain Location: Hand Pain Orientation: Left Pain Descriptors / Indicators: Discomfort Pain Onset: With Activity Patients Stated Pain Goal: Other (Comment) Pain Intervention(s): Repositioned ADL: See Care Tool Section for some details of mobility and selfcare   Therapy/Group: Individual Therapy  Trinitey Roache OTR/L 12/28/2020, 5:01 PM

## 2020-12-28 NOTE — Discharge Instructions (Addendum)
Inpatient Rehab Discharge Instructions  Alicia Guerrero Discharge date and time: 01/19/21   Activities/Precautions/ Functional Status: Activity: no lifting, driving, or strenuous exercise till cleared by MD Diet: cardiac diet--chopped foods. Needs supervision for safety with meals Wound Care: none needed   Functional status:  ___ No restrictions     ___ Walk up steps independently _X__ 24/7 supervision/assistance  ___ Walk up steps with assistance ___ Intermittent supervision/assistance ___ Bathe/dress independently ___ Walk with walker    _X__ Bathe/dress with assistance ___ Walk Independently    ___ Shower independently ___ Walk with assistance    ___ Shower with assistance _X__ No alcohol     ___ Return to work/school ________   Special Instructions: Monitor blood sugars twice a day and take record with you to primary care appointment.     COMMUNITY REFERRALS UPON DISCHARGE:    Home Health:   PT     OT     ST    RN    SNA    SW                   Agency: [facility] (formerly [facility] Phone: [phone]    Medical Equipment/Items Ordered:rolling walker and wheelchair                                                 Agency/Supplier: [place] Cigarette smoking nearly doubles your risk of having a stroke & is the single most alterable risk factor  If you smoke or have smoked in the last 12 months, you are advised to quit smoking for your health. Most of the excess cardiovascular risk related to smoking disappears within a year of stopping. Ask you doctor about anti-smoking medications [facility] Quit Line: 1-800-QUIT NOW Free Smoking Cessation Classes [phone]  CHOLESTEROL Know your levels; limit fat & cholesterol in your diet  Lipid Panel     Component Value Date/Time   CHOL 136 12/17/2020 0616   TRIG 76 12/17/2020 0616   HDL 42 12/17/2020 0616   CHOLHDL 3.2 12/17/2020 0616   VLDL 15  12/17/2020 0616   LDLCALC 79 12/17/2020 0616     Many patients benefit from treatment even if their cholesterol is at goal. Goal: Total Cholesterol (CHOL) less than 160 Goal:  Triglycerides (TRIG) less than 150 Goal:  HDL greater than 40 Goal:  LDL (LDLCALC) less than 100   BLOOD PRESSURE American Stroke Association blood pressure target is less that 120/80 mm/Hg  Your discharge blood pressure is:  BP: 125/69 Monitor your blood pressure Limit your salt and alcohol intake Many individuals will require more than one medication for high blood pressure  DIABETES (A1c is a blood sugar average for last 3 months) Goal HGBA1c is under 7% (HBGA1c is blood sugar average for last 3 months)  Diabetes:     Lab Results  Component Value Date   HGBA1C 6.0 (H) 12/17/2020    Your HGBA1c can be lowered with medications, healthy diet, and exercise. Check your blood sugar as directed by your physician Call your physician if you experience unexplained or low blood sugars.  PHYSICAL ACTIVITY/REHABILITATION Goal is 30 minutes at least 4 days per week  Activity: No driving, Therapies: See above Return to work: N/A Activity decreases your risk of heart attack and stroke  and makes your heart stronger.  It helps control your weight and blood pressure; helps you relax and can improve your mood. Participate in a regular exercise program. Talk with your doctor about the best form of exercise for you (dancing, walking, swimming, cycling).  DIET/WEIGHT Goal is to maintain a healthy weight  Your discharge diet is:  Diet Order             DIET DYS 2 Room service appropriate? Yes; Fluid consistency: Thin  Diet effective now                   liquids Your height is:  Height: 5\' 3"  (160 cm) Your current weight is: Weight: 63.4 kg Your Body Mass Index (BMI) is:  BMI (Calculated): 24.77 Following the type of diet specifically designed for you will help prevent another stroke. Your goal weight range is:   You  are at goal weight.  Healthy food habits can help reduce 3 risk factors for stroke:  High cholesterol, hypertension, and excess weight.  RESOURCES Stroke/Support Group:  Call [phone]   STROKE EDUCATION PROVIDED/REVIEWED AND GIVEN TO PATIENT Stroke warning signs and symptoms How to activate emergency medical system (call 911). Medications prescribed at discharge. Need for follow-up after discharge. Personal risk factors for stroke. Pneumonia vaccine given:  Flu vaccine given:  My questions have been answered, the writing is legible, and I understand these instructions.  I will adhere to these goals & educational materials that have been provided to me after my discharge from the hospital.      My questions have been answered and I understand these instructions. I will adhere to these goals and the provided educational materials after my discharge from the hospital.  Patient/Caregiver Signature _______________________________ Date __________  Clinician Signature _______________________________________ Date __________  Please bring this form and your medication list with you to all your follow-up doctor's appointments.        Information on my medicine - ELIQUIS (apixaban)  This medication education was reviewed with me or my healthcare representative as part of my discharge preparation.  The pharmacist that spoke with me during my hospital stay was:    Why was Eliquis prescribed for you? Eliquis was prescribed for you to reduce the risk of a blood clot forming that can cause a stroke if you have a medical condition called atrial fibrillation (a type of irregular heartbeat).  What do You need to know about Eliquis ? Take your Eliquis TWICE DAILY - one tablet in the morning and one tablet in the evening with or without food. If you have difficulty swallowing the tablet whole please discuss with your pharmacist how to take the medication safely.  Take Eliquis exactly as  prescribed by your doctor and DO NOT stop taking Eliquis without talking to the doctor who prescribed the medication.  Stopping may increase your risk of developing a stroke.  Refill your prescription before you run out.  After discharge, you should have regular check-up appointments with your healthcare provider that is prescribing your Eliquis.  In the future your dose may need to be changed if your kidney function or weight changes by a significant amount or as you get older.  What do you do if you miss a dose? If you miss a dose, take it as soon as you remember on the same day and resume taking twice daily.  Do not take more than one dose of ELIQUIS at the same time to make up a missed  dose.  Important Safety Information A possible side effect of Eliquis is bleeding. You should call your healthcare provider right away if you experience any of the following: Bleeding from an injury or your nose that does not stop. Unusual colored urine (red or dark brown) or unusual colored stools (red or black). Unusual bruising for unknown reasons. A serious fall or if you hit your head (even if there is no bleeding).  Some medicines may interact with Eliquis and might increase your risk of bleeding or clotting while on Eliquis. To help avoid this, consult your healthcare provider or pharmacist prior to using any new prescription or non-prescription medications, including herbals, vitamins, non-steroidal anti-inflammatory drugs (NSAIDs) and supplements.  This website has more information on Eliquis (apixaban): http://[email or web address].com/eliquis/home

## 2020-12-28 NOTE — Progress Notes (Signed)
PROGRESS NOTE   Subjective/Complaints: No issues overnite Not oriented to time  Pt states feel in LUE less than on right side   ROS-  Limited by cognition Objective:   No results found. Recent Labs    12/27/20 0629  WBC 12.4*  HGB 14.1  HCT 43.4  PLT 371     Recent Labs    12/27/20 0629  NA 138  K 3.6  CL 103  CO2 25  GLUCOSE 134*  BUN 21  CREATININE 0.77  CALCIUM 9.5      Intake/Output Summary (Last 24 hours) at 12/28/2020 8338 Last data filed at 12/27/2020 1300 Gross per 24 hour  Intake 60 ml  Output --  Net 60 ml       Pressure Injury 12/22/20 Coccyx Left Stage 1 -  Intact skin with non-blanchable redness of a localized area usually over a bony prominence. (Active)  12/22/20 1647  Location: Coccyx  Location Orientation: Left  Staging: Stage 1 -  Intact skin with non-blanchable redness of a localized area usually over a bony prominence.  Wound Description (Comments):   Present on Admission: Yes    Physical Exam: Vital Signs Blood pressure 120/72, pulse 65, temperature 97.6 F (36.4 C), resp. rate 16, height 5\' 3"  (1.6 m), weight 64.5 kg, SpO2 97 %.   General: No acute distress Mood and affect are appropriate Heart: Regular rate and rhythm no rubs murmurs or extra sounds Lungs: Clear to auscultation, breathing unlabored, no rales or wheezes Abdomen: Positive bowel sounds, soft nontender to palpation, nondistended Extremities: No clubbing, cyanosis, or edema   Skin: No evidence of breakdown, no evidence of rash Neurologic: Oriented to person , Zacarias Pontes, Month and situation (Stroke)  Cranial nerves II through XII intact, motor strength is 5/5 in Right deltoid, bicep, tricep, grip, hip flexor, knee extensors, ankle dorsiflexor and plantar flexor, 2- Left delt bic , tri, grip, 3- L HF, KE ADF Sensory exam normal sensation to light touch and proprioception in bilateral upper and lower  extremities Cerebellar exam normal finger to nose to finger as well as heel to shin in bilateral upper and lower extremities Musculoskeletal: Full range of motion in all 4 extremities. No joint swelling    Assessment/Plan: 1. Functional deficits which require 3+ hours per day of interdisciplinary therapy in a comprehensive inpatient rehab setting. Physiatrist is providing close team supervision and 24 hour management of active medical problems listed below. Physiatrist and rehab team continue to assess barriers to discharge/monitor patient progress toward functional and medical goals  Care Tool:  Bathing    Body parts bathed by patient: Chest, Abdomen, Right upper leg, Left upper leg, Face   Body parts bathed by helper: Buttocks, Front perineal area, Left lower leg, Right lower leg, Right arm, Left arm, Chest, Abdomen     Bathing assist Assist Level: 2 Helpers     Upper Body Dressing/Undressing Upper body dressing   What is the patient wearing?: Pull over shirt    Upper body assist Assist Level: Maximal Assistance - Patient 25 - 49%    Lower Body Dressing/Undressing Lower body dressing      What is the patient wearing?: Pants  Lower body assist Assist for lower body dressing: Total Assistance - Patient < 25% (supine rolling)     Toileting Toileting    Toileting assist Assist for toileting: Total Assistance - Patient < 25%     Transfers Chair/bed transfer  Transfers assist  Chair/bed transfer activity did not occur: Safety/medical concerns  Chair/bed transfer assist level: Dependent - mechanical lift (stedy)     Locomotion Ambulation   Ambulation assist   Ambulation activity did not occur: Safety/medical concerns  Assist level: 2 helpers (+2 mod A and +3 w/c follow) Assistive device: Other (comment) (RW & L hand orthosis) Max distance: 75ft   Walk 10 feet activity   Assist  Walk 10 feet activity did not occur: Safety/medical concerns         Walk 50 feet activity   Assist Walk 50 feet with 2 turns activity did not occur: Safety/medical concerns         Walk 150 feet activity   Assist Walk 150 feet activity did not occur: Safety/medical concerns         Walk 10 feet on uneven surface  activity   Assist Walk 10 feet on uneven surfaces activity did not occur: Safety/medical concerns         Wheelchair     Assist Is the patient using a wheelchair?: No             Wheelchair 50 feet with 2 turns activity    Assist            Wheelchair 150 feet activity     Assist          Blood pressure 120/72, pulse 65, temperature 97.6 F (36.4 C), resp. rate 16, height 5\' 3"  (1.6 m), weight 64.5 kg, SpO2 97 %.  Medical Problem List and Plan: 1. Functional deficits secondary to posterior circulation stroke             -patient may shower             -ELOS/Goals: 10-14 days S- team con in am              -Continue CIR- PT, OT and SLP  2.  Antithrombotics: -DVT/anticoagulation:  Pharmaceutical: Other (comment)--Eliquis             -antiplatelet therapy: ASA added  3. Pain Management: Tylenol prn.  4. Mood: LCSW to follow for evaluation and support.              -antipsychotic agents: N/A 5. Neuropsych: This patient is not fully capable of making decisions on capable own behalf. 6. Skin/Wound Care: Routine pressure relief measures 7. Fluids/Electrolytes/Nutrition:  N/A 8. CAF/ICM s/p ICD: Monitor HR TID. Continue Eliquis and bisoprolol.             --monitor for symptoms with increase in activity.   12/10- HR is doing OK- 60s- con't regimen 9. H/o CVA with L-HP: Was independent without AD PTA. 10. HTN: Monitor BP TID--SBP goal 120-140 range per neur due to MCA/PCA stenosis             --continue bisoprolol and Avapro.  Vitals:   12/27/20 1941 12/28/20 0450  BP: 134/75 120/72  Pulse: 66 65  Resp: 17 16  Temp: 97.9 F (36.6 C) 97.6 F (36.4 C)  SpO2: 98% 97%  Controlled   amlodipine 5mg  since 12/10  12/11- BP low 329J systolic- con't regimen for now 11. Dysphagia: Continue D1, nectar liquids. Recheck BMET in am.             --  does not like current diet. Discussed dysphagia and need for modified diet.   12/10- pt focusing on meal- con't regimen 12. T2DM: Hgb A1C- 6.0. Continue to hold metformin             --monitor BS ac/hs and use SSI for now.  CBG (last 3)  Recent Labs    12/27/20 1653 12/27/20 2036 12/28/20 0553  GLUCAP 128* 208* 125*    12/12 fair control 13. Chronic intermittent insomnia: Will resume Melatonin (used 10 mg prn PTA) 14. Chronic congeseted cough: For 5-6 years and felt to be due to allergies. --Will add Claritin.    15.  Lethargy/confusion  labs ok, afebrile, no sedating meds   UCx no growth   LOS: 6 days A FACE TO FACE EVALUATION WAS PERFORMED  Charlett Blake 12/28/2020, 8:29 AM

## 2020-12-28 NOTE — Progress Notes (Signed)
Speech Language Pathology Daily Session Note  Patient Details  Name: Alicia Guerrero MRN: [id number] Date of Birth: May 19, 1935  Today's Date: 12/28/2020 SLP Individual Time: 3888-2800 SLP Individual Time Calculation (min): 60 min  Short Term Goals: Week 1: SLP Short Term Goal 1 (Week 1): Patient will use external aids to orient to person/place/time/situation with mod A verbal cues SLP Short Term Goal 2 (Week 1): Patient will perform basic and familiar tasks for 5 minute intervals with mod A verbal cues for redirection SLP Short Term Goal 3 (Week 1): Patient will complete basic, familiar tasks with mod A verbal cues for functional problem solving SLP Short Term Goal 4 (Week 1): Patient will utilize speech intelligiblity strategies at sentence level to achieve 90% accuracy with mod A verbal cues SLP Short Term Goal 5 (Week 1): Patient will consume current diet with improved oral control and without overt s/sx of aspiration with min A verbal cues  Skilled Therapeutic Interventions: Skilled ST treatment focused on cognitive, speech, and swallowing goals. Pt was oriented to person, place, and situation. She was not oriented to time. She read time correctly on clock but felt that it was evening despite time being a.m. SLP opened pt's blinds and turned on lights to add more daylight to further support orientation to time of day. SLP educated on speech intelligible strategies using "SLOP" acronym (speak slow, loud, over-articulate, and pause between words). Pt was perceived as ~90% intelligible at sentence level and implemented strategies with min A. Pt reported speech continues to fluctuate based on fatigue. SLP also facilitated Dys 2 PO trials using soft cookie. Pt exhibited prolonged and effortful mastication (3 minutes) resulting in reported fatigue, and mild-to-moderate oral residuals (mostly on hard palate). No overt s/sx of aspiration noted with solid trials. Pt required multiple nectar thick liquid  sips, cues to implement lingual and finger sweep to eventually clear oral residuals and required mod A cues to implement these techniques.This process took approximately 5 minutes per small bite. No overt s/sx of aspiration were noted with nectar thick liquids, however pt coughed with crushed meds in applesauce x2. Continue current diet with continued dys 2 PO trials with SLP. Pt performed oral care at end of session using suction sponge with supervision A for thoroughness. She required total A for denture care. Prior to SLP exiting, pt accidentally pressed her call bell and expressed she thought the police would come when she presses the "red button." Educated pt that nsg staff would come to assist patient when call bell is pressed and educated on appropriate circumstances to press button (i.e., bathroom, pain, help reaching something). Patient was left in bed with alarm activated and immediate needs within reach at end of session. Continue per current plan of care.      Pain Pain Assessment Pain Scale: 0-10 Pain Score: 0-No pain  Therapy/Group: Individual Therapy  Patty Sermons 12/28/2020, 8:59 AM

## 2020-12-29 LAB — GLUCOSE, CAPILLARY
Glucose-Capillary: 142 mg/dL — ABNORMAL HIGH (ref 70–99)
Glucose-Capillary: 188 mg/dL — ABNORMAL HIGH (ref 70–99)
Glucose-Capillary: 136 mg/dL — ABNORMAL HIGH (ref 70–99)

## 2020-12-29 NOTE — Progress Notes (Signed)
Occupational Therapy Weekly Progress Note  Patient Details  Name: Alicia Guerrero MRN: [id number] Date of Birth: [birth year]  Beginning of progress report period: December 23, 2020 End of progress report period: December 29, 2020  Today's Date: 12/29/2020 OT Individual Time: 1418-1530 OT Individual Time Calculation (min): 72 min    Patient has met 2 of 4 short term goals.  [name] is making steady progress with OT tasks at this time.  She is currently completing UB bathing at min assist in supported sitting with mod to max in unsupported sitting.  She is able to complete UB dressing at overall max assist level sit to stand.  LB bathing and dressing are also at max assist level sit to stand.  She continues with mod assist for squat pivot transfers with mod to max assist for stand pivots to the [facility].  LUE function continues to be limited with pt currently demonstrating movement at a Brunnstrum stage IV level in the arm and hand.  She needs mod to max hand over hand assist for integration into bathing tasks for holding and pouring the soap to washing parts of her body.  [name] also still continues to report some increased wrist pain and thumb CMC pain with passive extension.  Left inattention continues with pt needing mod instructional cueing at times to attend visually to the LUE when attempting to integrate it into function.  [name] continues with some confusion but she is now oriented to place and situation.  She is also able to state some of her physical deficits, however, she does still have some hallucinations at nights, telling therapist's about them during treatments.  Feel overall her progress has begun to take off and feel she is appropriate for continued CIR level therapy in order to reach min assist level goals for home.  Plan for discharge home with her spouse, but her son and other family members will provide hands on assist.  Will provide family education as appropriate when son is  present and provide formal scheduled education closer to discharge.     Patient continues to demonstrate the following deficits: muscle weakness and muscle paralysis, impaired timing and sequencing, unbalanced muscle activation, decreased coordination, and decreased motor planning, decreased attention to left, decreased attention, decreased awareness, decreased problem solving, decreased safety awareness, decreased memory, and delayed processing, and decreased sitting balance, decreased standing balance, decreased postural control, hemiplegia, and decreased balance strategies and therefore will continue to benefit from skilled OT intervention to enhance overall performance with BADL and Reduce care partner burden.  Patient progressing toward long term goals..  Continue plan of care.  OT Short Term Goals Week 2:  OT Short Term Goal 1 (Week 2): Pt will complete stand pivot transfer to the 3:1 with use of the RW and min assist for 2/3 trials. OT Short Term Goal 2 (Week 2): Pt will complete UB dressing with no more than mod assist for a pullover garment. OT Short Term Goal 3 (Week 2): Pt will complete LB bathing sit to stand with mod assist. OT Short Term Goal 4 (Week 2): Pt will use the LUE at a gross assist level with min assist during selfcare tasks.  Skilled Therapeutic Interventions/Progress Updates:    Pt in bed to start session with requests to transfer to the toilet.  She was able to transition to sitting with mod assist and then ambulate with the RW with hand splint on the left to the 3:1 over the toilet with mod  assist.  She needed mod assist for clothing management and hygiene as well sit to stand.  She was able to then ambulate to the sink with mod assist using the RW for standing and washing her hands.  She then completed transfer to the tilt in space wheelchair and was transported down to the therapy gym.  She transferred to the therapy mat at mod assist level stand pivot with focus on sit to  stand transitions, standing balance, and LUE functional use.  She needs mod demonstrational cueing to achieve and maintain anterior pelvic tilt for periods of up to 1 min, while completion reaching task with the LUE.  Therapist had her complete lateral pinch when card was presented and then provided mod assist to place it on the matching card on the mirror.  Also had her work in standing at max assist level to pick up and place cards with the RUE.  Increased posterior lean noted in standing with therapist having to provide constant input to maintain balance.  She was unable to complete forward weight shift to prevent falling, even when attempting to have her reach to place the cards.  She would continually try and step forward and fall into flexion instead of completing weight shifts.  Several repetitions in standing were completed for approximately 2 mins of standing.  Finished session with functional mobility over to the wheelchair 15' away with bilateral hand held assist total +2 (pt 40%).  Increased scissoring noted on the first step but improved with further steps.  Returned to the room via wheelchair with pt left sitting up with full lap tray in place and call button and phone in reach.  Safety belt in place as well.   Therapy Documentation Precautions:  Precautions Precautions: Fall, Other (comment) Precaution Comments: L side hemiparesis, left neglect, SBP goal 120-140 range Restrictions Weight Bearing Restrictions: No Therapy Vitals Temp: 97.9 F (36.6 C) Temp Source: Oral Pulse Rate: 65 Resp: 18 BP: 110/70 Patient Position (if appropriate): Sitting Oxygen Therapy SpO2: 96 % O2 Device: Room Air Pain: Pain Assessment Pain Scale: Faces Pain Score: 0-No pain ADL:   Therapy/Group: Individual Therapy  Latavia Goga OTR/L 12/29/2020, 3:57 PM

## 2020-12-29 NOTE — Progress Notes (Signed)
PROGRESS NOTE   Subjective/Complaints: Oriented to person , needs cues for place   ROS-  Limited by cognition Objective:   No results found. Recent Labs    12/27/20 0629  WBC 12.4*  HGB 14.1  HCT 43.4  PLT 371     Recent Labs    12/27/20 0629  NA 138  K 3.6  CL 103  CO2 25  GLUCOSE 134*  BUN 21  CREATININE 0.77  CALCIUM 9.5      Intake/Output Summary (Last 24 hours) at 12/29/2020 0909 Last data filed at 12/29/2020 0735 Gross per 24 hour  Intake 176 ml  Output --  Net 176 ml           Physical Exam: Vital Signs Blood pressure (!) 144/68, pulse 66, temperature 98.2 F (36.8 C), resp. rate 17, height _0  (1.6 m), weight 64.5 kg, SpO2 97 %.  General: No acute distress Mood and affect are appropriate Heart: Regular rate and rhythm no rubs murmurs or extra sounds Lungs: Clear to auscultation, breathing unlabored, no rales or wheezes Abdomen: Positive bowel sounds, soft nontender to palpation, nondistended Extremities: No clubbing, cyanosis, or edema  Skin: No evidence of breakdown, no evidence of rash Neurologic: Oriented to person , Alicia Guerrero, Month and situation (Stroke)  Cranial nerves II through XII intact, motor strength is 5/5 in Right deltoid, bicep, tricep, grip, hip flexor, knee extensors, ankle dorsiflexor and plantar flexor, 2- Left delt bic , tri, grip, 3- L HF, KE ADF Sensory exam normal sensation to light touch and proprioception in bilateral upper and lower extremities Cerebellar exam normal finger to nose to finger as well as heel to shin in bilateral upper and lower extremities Musculoskeletal: Full range of motion in all 4 extremities. No joint swelling    Assessment/Plan: 1. Functional deficits which require 3+ hours per day of interdisciplinary therapy in a comprehensive inpatient rehab setting. Physiatrist is providing close team supervision and 24 hour management of  active medical problems listed below. Physiatrist and rehab team continue to assess barriers to discharge/monitor patient progress toward functional and medical goals  Care Tool:  Bathing    Body parts bathed by patient: Chest, Abdomen, Right upper leg, Left upper leg, Face   Body parts bathed by helper: Buttocks, Front perineal area, Left lower leg, Right lower leg, Right arm, Left arm, Chest, Abdomen     Bathing assist Assist Level: 2 Helpers     Upper Body Dressing/Undressing Upper body dressing   What is the patient wearing?: Pull over shirt    Upper body assist Assist Level: Maximal Assistance - Patient 25 - 49%    Lower Body Dressing/Undressing Lower body dressing      What is the patient wearing?: Pants     Lower body assist Assist for lower body dressing: Total Assistance - Patient < 25% (supine rolling)     Toileting Toileting    Toileting assist Assist for toileting: Total Assistance - Patient < 25%     Transfers Chair/bed transfer  Transfers assist  Chair/bed transfer activity did not occur: Safety/medical concerns  Chair/bed transfer assist level: Dependent - mechanical lift (stedy)     Locomotion  Ambulation   Ambulation assist   Ambulation activity did not occur: Safety/medical concerns  Assist level: 2 helpers (mod A of 1 and +2 w/c follow) Assistive device: Walker-rolling Max distance: 67f   Walk 10 feet activity   Assist  Walk 10 feet activity did not occur: Safety/medical concerns        Walk 50 feet activity   Assist Walk 50 feet with 2 turns activity did not occur: Safety/medical concerns         Walk 150 feet activity   Assist Walk 150 feet activity did not occur: Safety/medical concerns         Walk 10 feet on uneven surface  activity   Assist Walk 10 feet on uneven surfaces activity did not occur: Safety/medical concerns         Wheelchair     Assist Is the patient using a wheelchair?: No              Wheelchair 50 feet with 2 turns activity    Assist            Wheelchair 150 feet activity     Assist          Blood pressure (!) 144/68, pulse 66, temperature 98.2 F (36.8 C), resp. rate 17, height _0  (1.6 m), weight 64.5 kg, SpO2 97 %.  Medical Problem List and Plan: 1. Functional deficits secondary to posterior circulation stroke             -patient may shower             -ELOS/Goals: 10-14 days S- Team conference today please see physician documentation under team conference tab, met with team  to discuss problems,progress, and goals. Formulized individual treatment plan based on medical history, underlying problem and comorbidities.              -Continue CIR- PT, OT and SLP  2.  Antithrombotics: -DVT/anticoagulation:  Pharmaceutical: Other (comment)--Eliquis             -antiplatelet therapy: ASA added  3. Pain Management: Tylenol prn.  4. Mood: LCSW to follow for evaluation and support.              -antipsychotic agents: N/A 5. Neuropsych: This patient is not fully capable of making decisions on capable own behalf. 6. Skin/Wound Care: Routine pressure relief measures 7. Fluids/Electrolytes/Nutrition:  N/A 8. CAF/ICM s/p ICD: Monitor HR TID. Continue Eliquis and bisoprolol.             --monitor for symptoms with increase in activity.   12/10- HR is doing OK- 60s- con't regimen 9. H/o CVA with L-HP: Was independent without AD PTA. 10. HTN: Monitor BP TID--SBP goal 120-140 range per neur due to MCA/PCA stenosis             --continue bisoprolol and Avapro.  Vitals:   12/28/20 1838 12/29/20 0540  BP: 135/66 (!) 144/68  Pulse: 66 66  Resp: 17 17  Temp: 97.8 F (36.6 C) 98.2 F (36.8 C)  SpO2: 96% 97%  Controlled  amlodipine 546msince 12/10  12/11- BP low 14062Bystolic- con't regimen for now 11. Dysphagia: Continue D1, nectar liquids. Recheck BMET in am.             --does not like current diet. Discussed dysphagia and need for  modified diet.   12/10- pt focusing on meal- con't regimen 12. T2DM: Hgb A1C- 6.0. Continue to hold metformin             --  monitor BS ac/hs and use SSI for now.  CBG (last 3)  Recent Labs    12/28/20 1705 12/28/20 2146 12/29/20 0559  GLUCAP 170* 170* 142*    12/14 fair control 13. Chronic intermittent insomnia: Will resume Melatonin (used 10 mg prn PTA) 14. Chronic congeseted cough: For 5-6 years and felt to be due to allergies. --Will add Claritin.    15.  Lethargy/confusion  labs ok, afebrile, no sedating meds   UCx no growth   LOS: 7 days A FACE TO FACE EVALUATION WAS PERFORMED  Charlett Blake 12/29/2020, 9:09 AM

## 2020-12-29 NOTE — Progress Notes (Addendum)
Speech Language Pathology Daily Session Note  Patient Details  Name: Alicia Guerrero MRN: [id number] Date of Birth: May 05, 1935  Today's Date: 12/29/2020 SLP Individual Time: 4656-8127 SLP Individual Time Calculation (min): 45 min  Short Term Goals: Week 1: SLP Short Term Goal 1 (Week 1): Patient will use external aids to orient to person/place/time/situation with mod A verbal cues SLP Short Term Goal 2 (Week 1): Patient will perform basic and familiar tasks for 5 minute intervals with mod A verbal cues for redirection SLP Short Term Goal 3 (Week 1): Patient will complete basic, familiar tasks with mod A verbal cues for functional problem solving SLP Short Term Goal 4 (Week 1): Patient will utilize speech intelligiblity strategies at sentence level to achieve 90% accuracy with mod A verbal cues SLP Short Term Goal 5 (Week 1): Patient will consume current diet with improved oral control and without overt s/sx of aspiration with min A verbal cues  Skilled Therapeutic Interventions: Skilled ST treatment focused on cognitive and speech goals. Patient was disoriented to time and place today. Patient was able to recall speech intelligibility strategies discussed during yesterday's session when given mod A verbal cues via field of choices. Her overall speech intelligibility was perceived as 75-90% intelligible at conversation level today with min A for implementation of strategies. SLP also facilitated session by providing sup A verbal cues for organizing common household foods and items in pantry/fridge/freezer. Patient completed majority of task with modified independence, and eventually required supervision A verbal cues for error awareness after 10 minute duration. She sustained attention for 15 minutes with min A verbal cues for redirection. Patient was left in bed with alarm activated and immediate needs within reach at end of session. Continue per current plan of care.       Pain Pain  Assessment Pain Scale: 0-10 Pain Score: 0-No pain  Therapy/Group: Individual Therapy  Abrea Henle T Christie Copley 12/29/2020, 8:10 AM

## 2020-12-29 NOTE — Progress Notes (Signed)
Physical Therapy Session Note  Patient Details  Name: Alicia Guerrero MRN: [id number] Date of Birth: Jan 01, 1936  Today's Date: 12/29/2020 PT Individual Time: 1110-1203 PT Individual Time Calculation (min): 53 min   Short Term Goals: Week 1:  PT Short Term Goal 1 (Week 1): Pt will perform supine<>sit with max assist PT Short Term Goal 2 (Week 1): Pt will tolerate upright sitting on EOB/EOM for at least 2 minutes with no more than mod assist PT Short Term Goal 3 (Week 1): Pt will perform bed<>chair transfers with +2 max assist using  LRAD PT Short Term Goal 4 (Week 1): Pt will perform sit<>stands using LRAD with +2 max assist  Skilled Therapeutic Interventions/Progress Updates:    Pt received sitting in TIS w/c and agreeable to therapy session. NT in/out for BS assessment.  Transported to/from gym in w/c for time management and energy conservation.   Block practice 2x stand pivot transfer TIS w/c<>EOM using RW - requires max cuing for proper hand placement (pushing up with R hand) when going to stand - light mod assist to rise to stand, delayed trunk/hip/knee extension but with cuing for upright posture can correct - heavy mod assist for balance and AD management while turning due to posterior lean and pt not able to motor plan/sequence turning AD.  Gait training 17ft 2x +60ft using RW with mod assist of 1 for balance and AD management - continues to have frequent "pauses" while walking with either delay in motor planning or delay in initiation or pt being distracted internally or externally - continues to push AD too far forward requiring repeated max cuing and manual assist to prevent it from moving too far in front of her - benefits from step-by-step verbal cuing to sustain attention on gait and initiate a step - continues to have primarily a step-to gait pattern with intermittent reciprocal as well as excessive trunk flexion.  Reports significant fatigue but with encouragement agreeable to  continue with session.  L stand pivot w/c>EOM using RW with heavier mod assist for balance and AD management due to fatigue with pt having worsening posterior lean/LOB.   Attempted standing with L UE support on RW card matching using R UE with pt having a heavy posterior lean with a very crouched posture and only able to attempt placing 2 cards with heavy mod assist for balance and repeated verbal cuing to sustain upright posture - demos poor dual-tasking abilities to maintain upright while completing a cognitive task (likely worsened by fatigue). Completed the card matching in sitting with it on her L side to promote attention and visual scanning - intermittent cuing to correctly match cards.  R stand pivot back to w/c using RW as above. Transported back to room and agreeable to remain sitting upright - left with needs in reach, seat belt alarm on, and full lap tray in place for L UE support.   Therapy Documentation Precautions:  Precautions Precautions: Fall, Other (comment) Precaution Comments: L side hemiparesis, left neglect, SBP goal 120-140 range Restrictions Weight Bearing Restrictions: No   Pain: No reports of pain throughout session.    Therapy/Group: Individual Therapy  Tawana Scale , PT, DPT, NCS, CSRS  12/29/2020, 7:57 AM

## 2020-12-29 NOTE — Progress Notes (Signed)
Patient refused resting hand splint states cant sleep with it hurts

## 2020-12-29 NOTE — Progress Notes (Signed)
Occupational Therapy Session Note  Patient Details  Name: Alicia Guerrero MRN: [id number] Date of Birth: September 06, 1935  Today's Date: 12/29/2020 OT Individual Time: 5750-5183 OT Individual Time Calculation (min): 30 min    Short Term Goals: Week 1:  OT Short Term Goal 1 (Week 1): Pt will complete static sitting balance for 5 mins in preparation for selfcare tasks with no more than min assist. OT Short Term Goal 2 (Week 1): Pt will complete UB bathing with min assist in unsupported sitting. OT Short Term Goal 3 (Week 1): Pt will complete LB bathing sit to stand with max assist for two consecutive sessions. OT Short Term Goal 4 (Week 1): Pt will complete toilet transfer squat/stand pivot with mod assist to the drop arm commode.  Skilled Therapeutic Interventions/Progress Updates:  Patient met seated in wc in agreement with OT treatment session. 0/10 pain reported at rest and with activity. Stedy used for toilet transfer (for time management). Sit to stand from wc with cues for LUE placement. Seated on commode, patient voided bladder a small amount. Patient reports "dribbling" with movement/coughing at baseline. UB dressing seated on commode with Max A and cues for hemi technique. Max A to thread BLE through brief/pants and hike over hips in standing with use of stedy. At sink level, patient completed oral hygiene with Max A and hand over hand for incorporation of LUE at gross assist level. Session concluded with patient seatd in wc with call bell within reach, belt alarm activated and all needs met.   Therapy Documentation Precautions:  Precautions Precautions: Fall, Other (comment) Precaution Comments: L side hemiparesis, left neglect, SBP goal 120-140 range Restrictions Weight Bearing Restrictions: No General:    Therapy/Group: Individual Therapy  [name] 12/29/2020, 6:54 AM

## 2020-12-29 NOTE — Patient Care Conference (Signed)
Inpatient RehabilitationTeam Conference and Plan of Care Update Date: 12/29/2020   Time: 11:01 AM    Patient Name: Alicia Guerrero      Medical Record Number: [id number]  Date of Birth: 09-Jun-1935 Sex: Female         Room/Bed: 5C16C/5C16C-01 Payor Info: Payor: [employer] MEDICARE / Plan: [place] MEDICARE HMO/PPO / Product Type: *No Product type* /    Admit Date/Time:  12/22/2020  2:13 PM  Primary Diagnosis:  Stroke (cerebrum) [facility])  Hospital Problems: Principal Problem:   Stroke (cerebrum) [facility]) Active Problems:   Pressure injury of skin    Expected Discharge Date: Expected Discharge Date: 01/19/21  Team Members Present: Physician leading conference: Dr. Alger Simons Social Worker Present: Erlene Quan, BSW Nurse Present: Dorien Chihuahua, RN PT Present: Page Spiro, PT OT Present: Clyda Greener, OT SLP Present: Sherren Kerns, SLP PPS Coordinator present : Gunnar Fusi, SLP     Current Status/Progress Goal Weekly Team Focus  Bowel/Bladder   incontinent to bowel and bladder last bm 12/14  to have fewer incontinent episodes      Swallow/Nutrition/ Hydration             ADL's   Pt currently mod assist for static sitting balance with max assist for dynamic sitting.  She needs mod assist for UB bathing with total +2 (pt 25%) for LB bathing and dressingsit to stand.  She was able to complete squat pivot transfer to the wheelchair at max to total assist.  She demonstrates Brunnstrum stage III in the left arm and hand with max hand over hand for integration into selfcare tasks.  Slight wrist pain with extension as well.  min assist level  selfcare retraining, transfer training, DME education, balance retraining, neuromuscular re-education, therapeutic exercise   Mobility   ModA/MaxA sitting balance, TotalA x1-2 standing balance, MaxA bed mob (sometimes Mod), MaxA transfers, posterior lean with poor balance strategies  MinA  bed mobility, transfers, balance, dc planning, pt/fam ed    Communication   min-to-mod A - speech intelligiblity fluctuates  min A (suspect will be able to upgrade goal to sup A)  speech intelligibility strategies   Safety/Cognition/ Behavioral Observations  mod A - status fluctuates  sup-to-min A  sustained attention, orientation, compensatory memory aids, basic problem solving, intellectual awareness   Pain   denies pain  denies pain      Skin   skin intact, some excoriation to the sacral area  skin to remain intact        Discharge Planning:  d/c home with assistance from son & other family. Spouse unable to provide physical assist   Team Discussion: Medically stable per MD. Incontinent; toileting protocol activated.   Patient on target to meet rehab goals: Currently needs min assist for upper body bathing and max assist for upper body dressing. Requires total assist for lower body care. Transfers with min guard assist; posterior bias. Wrist pain with extension; resting hand splint recommended.  Needs mod assist for cognition; currently on a D2 ; Nectar thick diet. Goals for discharge set for min assist and supervision for cognition.   *See Care Plan and progress notes for long and short-term goals.   Revisions to Treatment Plan:  N/A   Teaching Needs: Safety, medication management, secondary risk management, transfers, toileting, etc.   Current Barriers to Discharge: Decreased caregiver support  Possible Resolutions to Barriers: Family education with son     Medical Summary Current Status: some UE return needed, confusion improving , BP  improving  Barriers to Discharge: Medical stability   Possible Resolutions to Barriers/Weekly Focus: caregiver training   Continued Need for Acute Rehabilitation Level of Care: The patient requires daily medical management by a physician with specialized training in physical medicine and rehabilitation for the following reasons: Direction of a multidisciplinary physical rehabilitation program  to maximize functional independence : Yes Medical management of patient stability for increased activity during participation in an intensive rehabilitation regime.: Yes Analysis of laboratory values and/or radiology reports with any subsequent need for medication adjustment and/or medical intervention. : Yes   I attest that I was present, lead the team conference, and concur with the assessment and plan of the team.   Dorien Chihuahua B 12/29/2020, 3:30 PM

## 2020-12-29 NOTE — Progress Notes (Signed)
Patient ID: Alicia Guerrero, female   DOB: [birth year], 85 y.o.   MRN: [id number]  Team Conference Report to Patient/Family  Team Conference discussion was reviewed with the patient and caregiver, including goals, any changes in plan of care and target discharge date.  Patient and caregiver express understanding and are in agreement.  The patient has a target discharge date of 01/19/21.  Sw spoke to patient son provided conference updates. Pt son reports patient has arthritis and experience pain in the wrist previously due to a blood pressure cuff being too tight. No additional questions or concerns. Sw will continue to follow up.  Dyanne Iha 12/29/2020, 2:15 PM

## 2020-12-30 LAB — GLUCOSE, CAPILLARY
Glucose-Capillary: 118 mg/dL — ABNORMAL HIGH (ref 70–99)
Glucose-Capillary: 155 mg/dL — ABNORMAL HIGH (ref 70–99)
Glucose-Capillary: 149 mg/dL — ABNORMAL HIGH (ref 70–99)
Glucose-Capillary: 137 mg/dL — ABNORMAL HIGH (ref 70–99)

## 2020-12-30 MED ORDER — GLUCOSE 40 % PO GEL
ORAL | Status: AC
  Filled 2020-12-30: qty 1

## 2020-12-30 NOTE — Progress Notes (Signed)
Speech Language Pathology Weekly Progress and Session Note  Patient Details  Name: Alicia Guerrero MRN: [id number] Date of Birth: 1935-04-26  Beginning of progress report period: December 23, 2020 End of progress report period: December 30, 2020  Today's Date: 12/30/2020 SLP Individual Time: 7829-5621 SLP Individual Time Calculation (min): 45 min  Short Term Goals: Week 1: SLP Short Term Goal 1 (Week 1): Patient will use external aids to orient to person/place/time/situation with mod A verbal cues SLP Short Term Goal 1 - Progress (Week 1): Met SLP Short Term Goal 2 (Week 1): Patient will perform basic and familiar tasks for 5 minute intervals with mod A verbal cues for redirection SLP Short Term Goal 2 - Progress (Week 1): Met SLP Short Term Goal 3 (Week 1): Patient will complete basic, familiar tasks with mod A verbal cues for functional problem solving SLP Short Term Goal 3 - Progress (Week 1): Met SLP Short Term Goal 4 (Week 1): Patient will utilize speech intelligiblity strategies at sentence level to achieve 90% accuracy with mod A verbal cues SLP Short Term Goal 4 - Progress (Week 1): Met SLP Short Term Goal 5 (Week 1): Patient will consume current diet with improved oral control and without overt s/sx of aspiration with min A verbal cues SLP Short Term Goal 5 - Progress (Week 1): Met  New Short Term Goals: Week 2: SLP Short Term Goal 1 (Week 2): Patient will use external aids to orient to person/place/time/situation with sup A verbal cues SLP Short Term Goal 2 (Week 2): Patient will perform basic and familiar tasks for 15 minute intervals with min A verbal cues for redirection SLP Short Term Goal 3 (Week 2): Patient will complete basic, familiar tasks with min A verbal cues for functional problem solving SLP Short Term Goal 4 (Week 2): Patient will utilize speech intelligiblity strategies at sentence level to achieve 90% accuracy with sup A verbal cues SLP Short Term Goal 5 (Week  2): Patient will consume dysphagia 2 texture PO trials with improved mastication effectiveness and oral clearance and without overt s/sx of aspiration  Weekly Progress Updates: Patient has made steady gains and has met 5 of 5 STGs this reporting period. Currently, patient demonstrates improved alertness, sustained attention, orientation, working memory, and basic problem solving skills requiring overall min-to-mod A verbal and visual cues to complete functional and complex tasks accurately and safely. Her speech intelligibility has also improved and she is most consistently perceived as 90%+ intelligible at conversation level with min A verbal cues to implement strategies. She is currently consuming a dysphagia 1 diet (puree) and nectar thick liquids with minimal s/sx of aspiration and intermittent delayed throat clear. It should be noted patient continues to present with occasional cough at baseline without presentation of PO intake thus difficult to decipher if throat clearing or coughing events are attributed to PO consumption. Patient has participated in dysphagia 2 PO trials with prolonged, effortful, and inefficient mastication and oral prep thus not clinically appropriate for diet advancement at this time. Continue PO trials with SLP. Patient and family education is ongoing. Patient would benefit from continued skilled SLP intervention to maximize cognitive, speech, and swallow functioning and overall functional independence prior to discharge.   Intensity: Minumum of 1-2 x/day, 30 to 90 minutes Frequency: 3 to 5 out of 7 days Duration/Length of Stay: 1/4 Treatment/Interventions: Cognitive remediation/compensation;Cueing hierarchy;Dysphagia/aspiration precaution training;Environmental controls;Functional tasks;Patient/family education;Therapeutic Activities  Daily Session Skilled Therapeutic Interventions: Skilled ST treatment focused on swallowing and cognitive goals.  Pt performed self feeding  following set-up assist and required min A verbal cues to reduce rate of consumption and to facilitate oral clearance between bites. Pt consumed dysphagia 1 textures and nectar thick liquids with effective and timely oral prep, clearance, and without overt s/sx of aspiration. Pt consumed 75% of meal  Continue current diet at this time. SLP also facilitated session by providing min A verbal cues for use of external orientation aids to orient to time. Pt retained this information with supervision cues following 10 minute delay. Patient completed functional problem solving with min-to-mod A verbal cues. Pt had incontinent episode of urine without awareness. SLP performed peri care with sup A verbal cues for following directions. Pt then performed upper and lower body dressing at edge of bed and using Steady with max A. Patient was left in bed with alarm activated and immediate needs within reach at end of session. Continue per current plan of care.      General    Pain Pain Assessment Pain Scale: 0-10 Pain Score: 0-No pain  Therapy/Group: Individual Therapy  Patty Sermons 12/30/2020, 12:10 PM

## 2020-12-30 NOTE — Progress Notes (Signed)
Physical Therapy Weekly Progress Note  Patient Details  Name: Alicia Guerrero MRN: [id number] Date of Birth: [birth year]  Beginning of progress report period: December 23, 2020 End of progress report period: December 30, 2020  Today's Date: 12/30/2020 PT Individual Time: 0845-1000 and 1305-1330 PT Individual Time Calculation (min): 75 min and 25 min  Patient has met 3 of 3 short term goals.  She is ambulating up to 75 ft w/mod assist w/RW w/multimodal cues to attend to L hemibody.  She is transferring bed to/from wc, wc to/from commode w/mod assist w/and w/o AD w/cues to activate LLE /attend to L hemibody.  She is able to engage in 2-3 min standing reaching activities w/mod assist and cues to maintain midline. She does demonstrate improving LUE activation w/functional tasks w/heavy cueing/assist.    Patient continues to demonstrate the following deficits muscle weakness and muscle joint tightness, decreased cardiorespiratoy endurance, impaired timing and sequencing, abnormal tone, unbalanced muscle activation, decreased coordination, and decreased motor planning, decreased midline orientation, decreased attention to left, and decreased motor planning, and decreased attention, decreased awareness, decreased problem solving, and decreased safety awareness and therefore will continue to benefit from skilled PT intervention to increase functional independence with mobility.  Patient progressing toward long term goals..  Continue plan of care.  PT Short Term Goals Week 1:  PT Short Term Goal 1 (Week 1): Pt will perform supine<>sit with max assist PT Short Term Goal 1 - Progress (Week 1): Met PT Short Term Goal 2 (Week 1): Pt will tolerate upright sitting on EOB/EOM for at least 2 minutes with no more than mod assist PT Short Term Goal 2 - Progress (Week 1): Met PT Short Term Goal 3 (Week 1): Pt will perform bed<>chair transfers with +2 max assist using  LRAD PT Short Term Goal 3 - Progress (Week 1):  Met PT Short Term Goal 4 (Week 1): Pt will perform sit<>stands using LRAD with +2 max assist Week 2:  PT Short Term Goal 1 (Week 2): Pt will maintain sitting blance w/min assist during functional activity x 5 min PT Short Term Goal 2 (Week 2): Pt will perform sit to stand to AD w/min assist consistently PT Short Term Goal 3 (Week 2): Pt will ambulate 124f w/LRAD w/min assist  Skilled Therapeutic Interventions/Progress Updates:    AM Session  Sit to stand to RW when going to stand - light mod assist to rise to stand, delayed trunk/hip/knee extension but with cuing for upright posture can correct - mod assist for balance and AD management while turning due to L lean/inattention/decreased activation of L quads/gluts and pt not able to motor plan/sequence turning AD.  Gait training 72fRW x 2 with mod assist of 1 for balance and AD management - occasionally distracted by activity in r environment  - continues to push AD too far forward requiring repeated max cuing and manual assist to prevent it from moving too far in front of her - benefits from step-by-step verbal cuing to sustain attention on gait and initiate L step- continues to have primarily a step-to gait pattern with intermittent reciprocal as well as excessive trunk flexion.  Decreased step length and clearance on L but is able to correct somewhat w/cueing.  Functional standing balance/L UE activation activity: Standing at counter w/mod assist, cues for posture, tends to lean L w/increase L knee flexion, w/fatigue also leans post w/poor core/abd activation.  Hand over hand assist to grasp plastic wineglasses L L hand and reach to  overhead cabinet/release grasp -overall max facilitation of LUE use, mod assist w/balance, cues.  Commode transfer/toileting - mod assist stand pivot transfer wc to commode w/max cues for posture, sequencing, and attention to L hemibody.  Max assist for clothing management.  Pt continent of bowels.  Performs hygiene  w/set up and heaby mod assist for balance in stooped position while cleaning herself.  Set up for handwahsing in wc.   Short distance gait approx 60f to wc w/hha of 2.  Standing at counter, worked on t[employer] Corporationfrom R hand to L hand then placing in silverware container w/hand over hand assist to facilitate, pt self cues to correct posture frequently during task, mod assist for balance.  Pt transported back to room via wc.  Pt left oob in wc handed off to NT due to session timing out.      PM Session Denies pain Pt OOB in wc, states she is very tired and needs to rest.  Agreeable to gait and bed level therex + 30 min bed break before OT. Sit to stand w/cga and cues for hand placement, attention to L hand positioning, therapist stabilizing LLE to prevent sliding w/transition. Gait 775fw/Rw, min assist, cues for posture, pt tasked w/locating room numbers on wall to L during gait, cues for maintaining safe distance within walker, cues for midline, cues for LLE advancement. stand pivot transfer to bed w/max cues for completing full turn, sidestepping to L to reach [facility] difficult [facility], unable to abduct L hip without ER and flexion.  Sit to supine w/max cues for sequencing, placement of L exts during transition and to center self in bed. Bridgning x 10 reps, pt attempts to position RLE bias but therapist corrects to LLE bias to improve LLE activation. HOB elevated Pt supervized w/taking several sips of thickened rootbeer w/no apparent difficulties. Pt left supine w/rails up x 3, alarm set, bed in lowest position, and needs in reach.      Therapy Documentation Precautions:  Precautions Precautions: Fall, Other (comment) Precaution Comments: L side hemiparesis, left neglect, SBP goal 120-140 range Restrictions Weight Bearing Restrictions: No General:   Vital Signs:  Pain: Pain Assessment Pain Scale: 0-10 Pain Score: 0-No pain Vision/Perception     Mobility:    Locomotion :    Trunk/Postural Assessment :    Balance:   Exercises:   Other Treatments:     Therapy/Group: Individual Therapy  BaJerrilyn Cairo2/15/2022, 9:58 AM

## 2020-12-30 NOTE — Progress Notes (Signed)
PROGRESS NOTE   Subjective/Complaints: No issues overnite  ROS-  Limited by cognition Objective:   No results found. No results for input(s): WBC, HGB, HCT, PLT in the last 72 hours.   No results for input(s): NA, K, CL, CO2, GLUCOSE, BUN, CREATININE, CALCIUM in the last 72 hours.    Intake/Output Summary (Last 24 hours) at 12/30/2020 0849 Last data filed at 12/30/2020 0716 Gross per 24 hour  Intake 397 ml  Output --  Net 397 ml           Physical Exam: Vital Signs Blood pressure (!) 147/64, pulse 63, temperature (!) 97.5 F (36.4 C), resp. rate 16, height 5\' 3"  (1.6 m), weight 64.5 kg, SpO2 99 %.   Skin: No evidence of breakdown, no evidence of rash Neurologic: Oriented to person , Zacarias Pontes, Month and situation (Stroke)  Cranial nerves II through XII intact, motor strength is 5/5 in Right deltoid, bicep, tricep, grip, hip flexor, knee extensors, ankle dorsiflexor and plantar flexor, 2- Left delt bic , tri, grip, 3- L HF, KE ADF Sensory exam normal sensation to light touch and proprioception in bilateral upper and lower extremities Cerebellar exam normal finger to nose to finger as well as heel to shin in bilateral upper and lower extremities Musculoskeletal: Full range of motion in all 4 extremities. No joint swelling    Assessment/Plan: 1. Functional deficits which require 3+ hours per day of interdisciplinary therapy in a comprehensive inpatient rehab setting. Physiatrist is providing close team supervision and 24 hour management of active medical problems listed below. Physiatrist and rehab team continue to assess barriers to discharge/monitor patient progress toward functional and medical goals  Care Tool:  Bathing    Body parts bathed by patient: Chest, Abdomen, Right upper leg, Left upper leg, Face   Body parts bathed by helper: Buttocks, Front perineal area, Left lower leg, Right lower leg, Right  arm, Left arm, Chest, Abdomen     Bathing assist Assist Level: 2 Helpers     Upper Body Dressing/Undressing Upper body dressing   What is the patient wearing?: Pull over shirt    Upper body assist Assist Level: Maximal Assistance - Patient 25 - 49%    Lower Body Dressing/Undressing Lower body dressing      What is the patient wearing?: Pants, Underwear/pull up     Lower body assist Assist for lower body dressing: Maximal Assistance - Patient 25 - 49% (Stedy)     Toileting Toileting    Toileting assist Assist for toileting: Moderate Assistance - Patient 50 - 74%     Transfers Chair/bed transfer  Transfers assist  Chair/bed transfer activity did not occur: Safety/medical concerns  Chair/bed transfer assist level: Moderate Assistance - Patient 50 - 74% (stand pivot) Chair/bed transfer assistive device: Walker, Armrests   Locomotion Ambulation   Ambulation assist   Ambulation activity did not occur: Safety/medical concerns  Assist level: Moderate Assistance - Patient 50 - 74% Assistive device: Walker-rolling Max distance: 31ft   Walk 10 feet activity   Assist  Walk 10 feet activity did not occur: Safety/medical concerns        Walk 50 feet activity   Assist  Walk 50 feet with 2 turns activity did not occur: Safety/medical concerns         Walk 150 feet activity   Assist Walk 150 feet activity did not occur: Safety/medical concerns         Walk 10 feet on uneven surface  activity   Assist Walk 10 feet on uneven surfaces activity did not occur: Safety/medical concerns         Wheelchair     Assist Is the patient using a wheelchair?: No             Wheelchair 50 feet with 2 turns activity    Assist            Wheelchair 150 feet activity     Assist          Blood pressure (!) 147/64, pulse 63, temperature (!) 97.5 F (36.4 C), resp. rate 16, height 5\' 3"  (1.6 m), weight 64.5 kg, SpO2 99 %.  Medical  Problem List and Plan: 1. Functional deficits secondary to posterior circulation stroke             -patient may shower             -ELOS/Goals: 10-14 days S-               -Continue CIR- PT, OT and SLP  2.  Antithrombotics: -DVT/anticoagulation:  Pharmaceutical: Other (comment)--Eliquis             -antiplatelet therapy: ASA added  3. Pain Management: Tylenol prn.  4. Mood: LCSW to follow for evaluation and support.              -antipsychotic agents: N/A 5. Neuropsych: This patient is not fully capable of making decisions on capable own behalf. 6. Skin/Wound Care: Routine pressure relief measures 7. Fluids/Electrolytes/Nutrition:  N/A 8. CAF/ICM s/p ICD: Monitor HR TID. Continue Eliquis and bisoprolol.             --monitor for symptoms with increase in activity.   12/10- HR is doing OK- 60s- con't regimen 9. H/o CVA with L-HP: Was independent without AD PTA. 10. HTN: Monitor BP TID--SBP goal 120-140 range per neur due to MCA/PCA stenosis             --continue bisoprolol and Avapro.  Vitals:   12/29/20 1931 12/30/20 0504  BP: 129/70 (!) 147/64  Pulse: 66 63  Resp: 18 16  Temp: 97.8 F (36.6 C) (!) 97.5 F (36.4 C)  SpO2: 96% 99%  Controlled  amlodipine 5mg  since 12/10  12/11- BP low 573U systolic- con't regimen for now 11. Dysphagia: Continue D1, nectar liquids. Recheck BMET in am.             --does not like current diet. Discussed dysphagia and need for modified diet.   12/10- pt focusing on meal- con't regimen 12. T2DM: Hgb A1C- 6.0. Continue to hold metformin             --monitor BS ac/hs and use SSI for now.  CBG (last 3)  Recent Labs    12/29/20 1114 12/29/20 1635 12/30/20 0528  GLUCAP 188* 136* 118*    12/15 good control  13. Chronic intermittent insomnia: Will resume Melatonin (used 10 mg prn PTA) 14. Chronic congeseted cough: For 5-6 years and felt to be due to allergies. --Will add Claritin.    15.  Lethargy/confusion  labs ok, afebrile, no sedating  meds   UCx no growth   LOS: 8  days A FACE TO FACE EVALUATION WAS PERFORMED  Charlett Blake 12/30/2020, 8:49 AM

## 2020-12-30 NOTE — Progress Notes (Signed)
Occupational Therapy Session Note  Patient Details  Name: Alicia Guerrero MRN: [id number] Date of Birth: 1935-07-14  Today's Date: 12/30/2020 OT Individual Time: 4259-5638 OT Individual Time Calculation (min): 41 min    Short Term Goals: Week 2:  OT Short Term Goal 1 (Week 2): Pt will complete stand pivot transfer to the 3:1 with use of the RW and min assist for 2/3 trials. OT Short Term Goal 2 (Week 2): Pt will complete UB dressing with no more than mod assist for a pullover garment. OT Short Term Goal 3 (Week 2): Pt will complete LB bathing sit to stand with mod assist. OT Short Term Goal 4 (Week 2): Pt will use the LUE at a gross assist level with min assist during selfcare tasks. OT Short Term Goal 5 (Week 2): Pt will complete UB bathing with min assist in unsupported sitting.  Skilled Therapeutic Interventions/Progress Updates:    Pt in bed to start, agreeable to therapy.  She was able to transfer to the EOB with mod assist and mod demonstrational cueing for technique to roll to the left without use of the bed rail.  Session focused on LUE neuromuscular re-education and sitting posture/balance.  She maintains posterior pelvic tilt needing mod to max facilitation to achieve and maintain anterior pelvic tilt.  Had her work on washing her lap tray positioned in her lap with the LUE and washcloth with mod facilitation overall.  Progressed to positioning lap tray at an angle to encourage shoulder AROM without hiking while washing up and back.  Increased difficulty noted with shoulder external rotation with elbow extension compared to internal rotation.  Transitioned to having her reach to pick up objects from knee height up to bedside table height with mod to max facilitation with the LUE.  Gross digit flexion at approximately 60-70% with extension at about 50-60%.  Finished session with stand stepping up the side of the bed at mod assist in order to return to supine. She was left with the call  button and phone in reach and safety alarm in place.  LUE supported on pillows as well.   Therapy Documentation Precautions:  Precautions Precautions: Fall, Other (comment) Precaution Comments: L side hemiparesis, left neglect, SBP goal 120-140 range Restrictions Weight Bearing Restrictions: No   Pain: Pain Assessment Pain Scale: Faces Pain Score: 0-No pain   Therapy/Group: Individual Therapy  Marilla Boddy OTR/L 12/30/2020, 5:35 PM

## 2020-12-31 LAB — GLUCOSE, CAPILLARY
Glucose-Capillary: 119 mg/dL — ABNORMAL HIGH (ref 70–99)
Glucose-Capillary: 181 mg/dL — ABNORMAL HIGH (ref 70–99)
Glucose-Capillary: 169 mg/dL — ABNORMAL HIGH (ref 70–99)
Glucose-Capillary: 123 mg/dL — ABNORMAL HIGH (ref 70–99)

## 2020-12-31 NOTE — Progress Notes (Signed)
Physical Therapy Session Note  Patient Details  Name: Alicia Guerrero MRN: [id number] Date of Birth: [birth year]  Today's Date: 12/31/2020 PT Individual Time: 0920-1035 PT Individual Time Calculation (min): 75 min   Short Term Goals: Week 2:  PT Short Term Goal 1 (Week 2): Pt will maintain sitting blance w/min assist during functional activity x 5 min PT Short Term Goal 2 (Week 2): Pt will perform sit to stand to AD w/min assist consistently PT Short Term Goal 3 (Week 2): Pt will ambulate 151ft w/LRAD w/min assist  Skilled Therapeutic Interventions/Progress Updates:    Pt received sitting in TIS w/c and agreeable to therapy session. Requests to drink nectar thick root bear - provided to pt and she noted to cough significantly after drinking, pt states this happens often - SLP made aware.   Transported to/from gym in w/c for time management and energy conservation.   Sit>stand w/c>RW with mod assist for lifting to stand and balance due to posterior lean - pt continues to remain in a squatted/crouched posture until she puts both hands on RW and then she will extend hip/knees to achieve upright posture - continues to require max assist to place L hand on/off RW orthosis.  Gait training 31ft x4 using RW with mod assist for balance and AD management - pt continues to push AD too far forward in front of her and keeps a crouched posture throughout with minimal improvement when cued - demos decreased instances of "pausing" while walking - continues to have a slightly wider BOS that improves on 2nd walk with a more continuous reciprocal pattern though still slightly crouched - continues to be variable though depending on fatigue and attention to task (easily distracted by environment). Requires max verbal cuing and manual assist to turn AD and ensure properly lined up to chair prior to going to sit and still does this quickly with decreased control, landing heavily on chair .   While sitting on mat  between gait trials, pt able to maintain static sitting balance using R UE support with supervision for safety.  Dynamic standing balance with dual-bimanual-task of holding a large ball with both hand - requires mod assist to maintain upright as pt starts to have worsening crouch in standing when performing a dual-task due to impaired alternating attention - requires max/total hand-over-hand assist to hold ball with L UE and then work towards pushing ball out/in - cuing throughout to visually look at L arm to improve awareness of it's position.  Dynamic standing balance with dual task using L UE to grasp a deflated small beach ball and then place it on a table in front of her - requires total facilitation to place beach ball in hand and then to facilitate L arm movement to place it on table but is able to sustain a grasp during this without assist - continues to require mod assist for balance as pt has gradual worsening crouched posture with excessive B knee bend but no buckling just repeated verbal cuing to stand upright due to poor alternating attention from L UE task and maintaining upright.  Gait training ~30ft including around a cone using RW with heavier mod assist for balance and AD management when turning and due to fatigue pt having worsening crouched posture throughout requiring repeated cuing to maintain upright posture - more pauses during gait noticed at this time with shorter step lengths.   Transported back up to room and pt agreeable to remain sitting up in w/c until after  lunch. Pt left with needs in reach, seat belt alarm on, L UE supported on full lap tray, and slightly tilted back in TIS w/c.  Therapy Documentation Precautions:  Precautions Precautions: Fall, Other (comment) Precaution Comments: L side hemiparesis, left neglect, SBP goal 120-140 range Restrictions Weight Bearing Restrictions: No   Pain:  Denies pain during session.   Therapy/Group: Individual  Therapy  Tawana Scale , PT, DPT, NCS, CSRS  12/31/2020, 7:53 AM

## 2020-12-31 NOTE — Progress Notes (Signed)
Speech Language Pathology Daily Session Note  Patient Details  Name: Merisa Julio MRN: [id number] Date of Birth: Jul 07, 1935  Today's Date: 12/31/2020 SLP Individual Time: 1300-1400 SLP Individual Time Calculation (min): 60 min  Short Term Goals: Week 2: SLP Short Term Goal 1 (Week 2): Patient will use external aids to orient to person/place/time/situation with sup A verbal cues SLP Short Term Goal 2 (Week 2): Patient will perform basic and familiar tasks for 15 minute intervals with min A verbal cues for redirection SLP Short Term Goal 3 (Week 2): Patient will complete basic, familiar tasks with min A verbal cues for functional problem solving SLP Short Term Goal 4 (Week 2): Patient will utilize speech intelligiblity strategies at sentence level to achieve 90% accuracy with sup A verbal cues SLP Short Term Goal 5 (Week 2): Patient will consume dysphagia 2 texture PO trials with improved mastication effectiveness and oral clearance and without overt s/sx of aspiration  Skilled Therapeutic Interventions: Skilled ST treatment focused on swallowing and cognitive goals. Pt required min A for orientation to time with use of external aids. SLP provided min A verbal cues for swallow strategies with dysphagia 2 PO trials. She consumed dys 2 solids with improved oral preparation, coordination, and mastication effectiveness as compared to previous encounter. She continues to exhib prolonged mastication (1.5 minutes-2 minutes) and mild oral residuals in which she was able to effectively clear with lingual sweep and nectar thick liquid rinses. Patient did not exhibit any overt s/sx of aspiration with dysphagia 2 trials and nectar thick liquids. She exhibited intermittent cough without presentation of PO intake. Continue current diet and dys 2 trials with SLP. Patient's son has been providing patient with thickened root beer. Despite son being educated on thickening nectar thick liquids and swallowing  precautions by this documenting therapist, there is less opportunity to monitor the consistency of patient's liquids when they are not provided directly by staff. There is also concern that family may leave drinks within reach of patient resulting in increased of consuming PO intake without having full supervision. Spoke with nurse and nurse tech regarding discontinuing all outside beverages and reinforced full supervision with ALL PO intake to more closely monitor intake and minimize aspiration risk. Swallow precaution signs have been updated to reflect this more clearly. Patient was left in bed with alarm activated and immediate needs within reach at end of session. Continue per current plan of care.      Pain  No pain  Therapy/Group: Individual Therapy  Patty Sermons 12/31/2020, 1:58 PM

## 2020-12-31 NOTE — Progress Notes (Signed)
Occupational Therapy Session Note  Patient Details  Name: Alicia Guerrero MRN: [id number] Date of Birth: 1936/01/14  Today's Date: 12/31/2020 OT Individual Time: 0703-0800 OT Individual Time Calculation (min): 57 min    Short Term Goals: Week 2:  OT Short Term Goal 1 (Week 2): Pt will complete stand pivot transfer to the 3:1 with use of the RW and min assist for 2/3 trials. OT Short Term Goal 2 (Week 2): Pt will complete UB dressing with no more than mod assist for a pullover garment. OT Short Term Goal 3 (Week 2): Pt will complete LB bathing sit to stand with mod assist. OT Short Term Goal 4 (Week 2): Pt will use the LUE at a gross assist level with min assist during selfcare tasks. OT Short Term Goal 5 (Week 2): Pt will complete UB bathing with min assist in unsupported sitting.  Skilled Therapeutic Interventions/Progress Updates:  Patient met lying supine in bed in agreement with OT treatment session. Mild pain reported in L hand and buttocks (soreness). Patient reports feeling like movement is returning in L hand. Able to make a fist this date. Supine to EOB with Mod A and sit to stand from EOB with Mod A and cues for hand placement. Mobility to commode in bathroom with heavy multimodal cues and external assist for walker management. Mod A for toilet transfer and 3/3 parts of toileting task. Walk-in shower transfer via stand-pivot with Mod A and use of RW. Bathing at shower level with focus on RUE NMR with hand over hand assist, weight bearing through RUE, sitting balance with Max cues for upright posture secondary to anterior pelvic tilt. With increased fatigue, patient with worsening static sitting balance. Transferred back to [facility] for UB/LB dressing with patient requiring Mod A to don overhead shirt and Max A to don brief and pants in sitting/standing. Transfer to [place] in same manner as indicated above. Session concluded with patient seated in TIS wc with call bell within reach, belt alarm  activated and all needs met.   Therapy Documentation Precautions:  Precautions Precautions: Fall, Other (comment) Precaution Comments: L side hemiparesis, left neglect, SBP goal 120-140 range Restrictions Weight Bearing Restrictions: No General:    Therapy/Group: Individual Therapy  [name] 12/31/2020, 6:37 AM

## 2020-12-31 NOTE — Progress Notes (Signed)
PROGRESS NOTE   Subjective/Complaints:   No issues overnite, more alert , oriented to person day of week but not month, she did notice Xmas decorations and said "it must be December"  ROS-  Limited by cognition Objective:   No results found. No results for input(s): WBC, HGB, HCT, PLT in the last 72 hours.   No results for input(s): NA, K, CL, CO2, GLUCOSE, BUN, CREATININE, CALCIUM in the last 72 hours.    Intake/Output Summary (Last 24 hours) at 12/31/2020 0854 Last data filed at 12/31/2020 8657 Gross per 24 hour  Intake 360 ml  Output --  Net 360 ml           Physical Exam: Vital Signs Blood pressure 126/84, pulse 64, temperature 97.6 F (36.4 C), temperature source Oral, resp. rate 16, height 5\' 3"  (1.6 m), weight 64.5 kg, SpO2 95 %.   General: No acute distress Mood and affect are appropriate Heart: Regular rate and rhythm no rubs murmurs or extra sounds Lungs: Clear to auscultation, breathing unlabored, no rales or wheezes Abdomen: Positive bowel sounds, soft nontender to palpation, nondistended Extremities: No clubbing, cyanosis, or edema Skin: No evidence of breakdown, no evidence of rash Neurologic: Oriented to person , Zacarias Pontes, Month and situation (Stroke)  Cranial nerves II through XII intact, motor strength is 5/5 in Right deltoid, bicep, tricep, grip, hip flexor, knee extensors, ankle dorsiflexor and plantar flexor, 3- Left delt bic , tri, 2- grip, 3- L HF, KE ADF Sensory exam normal sensation to light touch and proprioception in bilateral upper and lower extremities  Musculoskeletal: Full range of motion in all 4 extremities. No joint swelling    Assessment/Plan: 1. Functional deficits which require 3+ hours per day of interdisciplinary therapy in a comprehensive inpatient rehab setting. Physiatrist is providing close team supervision and 24 hour management of active medical problems listed  below. Physiatrist and rehab team continue to assess barriers to discharge/monitor patient progress toward functional and medical goals  Care Tool:  Bathing    Body parts bathed by patient: Chest, Abdomen, Right upper leg, Left upper leg, Face, Left arm   Body parts bathed by helper: Buttocks, Front perineal area, Left lower leg, Right lower leg, Right arm, Chest, Abdomen     Bathing assist Assist Level: Moderate Assistance - Patient 50 - 74%     Upper Body Dressing/Undressing Upper body dressing   What is the patient wearing?: Pull over shirt    Upper body assist Assist Level: Maximal Assistance - Patient 25 - 49%    Lower Body Dressing/Undressing Lower body dressing      What is the patient wearing?: Pants, Underwear/pull up     Lower body assist Assist for lower body dressing: Maximal Assistance - Patient 25 - 49%     Toileting Toileting    Toileting assist Assist for toileting: Moderate Assistance - Patient 50 - 74%     Transfers Chair/bed transfer  Transfers assist  Chair/bed transfer activity did not occur: Safety/medical concerns  Chair/bed transfer assist level: Moderate Assistance - Patient 50 - 74% (stand pivot) Chair/bed transfer assistive device: Walker, Clinical biochemist   Ambulation assist  Ambulation activity did not occur: Safety/medical concerns  Assist level: Moderate Assistance - Patient 50 - 74% Assistive device: Walker-rolling Max distance: 25ft   Walk 10 feet activity   Assist  Walk 10 feet activity did not occur: Safety/medical concerns        Walk 50 feet activity   Assist Walk 50 feet with 2 turns activity did not occur: Safety/medical concerns         Walk 150 feet activity   Assist Walk 150 feet activity did not occur: Safety/medical concerns         Walk 10 feet on uneven surface  activity   Assist Walk 10 feet on uneven surfaces activity did not occur: Safety/medical concerns          Wheelchair     Assist Is the patient using a wheelchair?: No             Wheelchair 50 feet with 2 turns activity    Assist            Wheelchair 150 feet activity     Assist          Blood pressure 126/84, pulse 64, temperature 97.6 F (36.4 C), temperature source Oral, resp. rate 16, height 5\' 3"  (1.6 m), weight 64.5 kg, SpO2 95 %.  Medical Problem List and Plan: 1. Functional deficits secondary to posterior circulation stroke             -patient may shower             -ELOS/Goals: 10-14 days S-               -Continue CIR- PT, OT and SLP  2.  Antithrombotics: -DVT/anticoagulation:  Pharmaceutical: Other (comment)--Eliquis             -antiplatelet therapy: ASA added  3. Pain Management: Tylenol prn.  4. Mood: LCSW to follow for evaluation and support.              -antipsychotic agents: N/A 5. Neuropsych: This patient is not fully capable of making decisions on capable own behalf. 6. Skin/Wound Care: Routine pressure relief measures 7. Fluids/Electrolytes/Nutrition:  N/A 8. CAF/ICM s/p ICD: Monitor HR TID. Continue Eliquis and bisoprolol.             --monitor for symptoms with increase in activity.   12/10- HR is doing OK- 60s- con't regimen 9. H/o CVA with L-HP: Was independent without AD PTA. 10. HTN: Monitor BP TID--SBP goal 120-140 range per neur due to MCA/PCA stenosis             --continue bisoprolol and Avapro.  Vitals:   12/30/20 1945 12/31/20 0311  BP: 123/78 126/84  Pulse: 66 64  Resp: 16 16  Temp: 97.6 F (36.4 C) 97.6 F (36.4 C)  SpO2: 98% 95%  Controlled  12/16, amlodipine 5mg  since 12/10  12/11- BP low 509T systolic- con't regimen for now 11. Dysphagia: Continue D1, nectar liquids. Recheck BMET in am.             --does not like current diet. Discussed dysphagia and need for modified diet.   12/10- pt focusing on meal- con't regimen 12. T2DM: Hgb A1C- 6.0. Continue to hold metformin             --monitor BS ac/hs and  use SSI for now.  CBG (last 3)  Recent Labs    12/30/20 1702 12/30/20 2053 12/31/20 0545  GLUCAP 149* 137* 119*  12/16 good control  13. Chronic intermittent insomnia: Will resume Melatonin (used 10 mg prn PTA) 14. Chronic congeseted cough: For 5-6 years and felt to be due to allergies. --Will add Claritin.    15.  Lethargy/confusion- improved   labs ok, afebrile, no sedating meds   UCx no growth   LOS: 9 days A FACE TO FACE EVALUATION WAS PERFORMED  Charlett Blake 12/31/2020, 8:54 AM

## 2021-01-01 LAB — GLUCOSE, CAPILLARY
Glucose-Capillary: 125 mg/dL — ABNORMAL HIGH (ref 70–99)
Glucose-Capillary: 152 mg/dL — ABNORMAL HIGH (ref 70–99)
Glucose-Capillary: 176 mg/dL — ABNORMAL HIGH (ref 70–99)
Glucose-Capillary: 161 mg/dL — ABNORMAL HIGH (ref 70–99)

## 2021-01-02 LAB — GLUCOSE, CAPILLARY
Glucose-Capillary: 128 mg/dL — ABNORMAL HIGH (ref 70–99)
Glucose-Capillary: 158 mg/dL — ABNORMAL HIGH (ref 70–99)
Glucose-Capillary: 143 mg/dL — ABNORMAL HIGH (ref 70–99)
Glucose-Capillary: 133 mg/dL — ABNORMAL HIGH (ref 70–99)

## 2021-01-02 MED ORDER — ORAL CARE MOUTH RINSE
15.0000 mL | Freq: Two times a day (BID) | OROMUCOSAL | Status: DC
  Administered 2021-01-02 – 2021-01-19 (×34): 15 mL via OROMUCOSAL

## 2021-01-02 NOTE — Progress Notes (Signed)
PROGRESS NOTE   Subjective/Complaints:   No issues overnite   ROS-  Limited by cognition Objective:   No results found. No results for input(s): WBC, HGB, HCT, PLT in the last 72 hours.   No results for input(s): NA, K, CL, CO2, GLUCOSE, BUN, CREATININE, CALCIUM in the last 72 hours.    Intake/Output Summary (Last 24 hours) at 01/02/2021 0833 Last data filed at 01/02/2021 0805 Gross per 24 hour  Intake 820 ml  Output --  Net 820 ml           Physical Exam: Vital Signs Blood pressure 138/74, pulse 67, temperature 98.3 F (36.8 C), resp. rate 16, height 5\' 3"  (1.6 m), weight 64.5 kg, SpO2 95 %.   General: No acute distress Mood and affect are appropriate Heart: Regular rate and rhythm no rubs murmurs or extra sounds Lungs: Clear to auscultation, breathing unlabored, no rales or wheezes Abdomen: Positive bowel sounds, soft nontender to palpation, nondistended Extremities: No clubbing, cyanosis, or edema Skin: No evidence of breakdown, no evidence of rash   Neurologic: Oriented to person , Zacarias Pontes, Month and situation (Stroke)  Cranial nerves II through XII intact, motor strength is 5/5 in Right deltoid, bicep, tricep, grip, hip flexor, knee extensors, ankle dorsiflexor and plantar flexor, 3- Left delt bic , tri, 2- grip, 3- L HF, KE ADF Sensory exam normal sensation to light touch and proprioception in bilateral upper and lower extremities  Musculoskeletal: Full range of motion in all 4 extremities. No joint swelling    Assessment/Plan: 1. Functional deficits which require 3+ hours per day of interdisciplinary therapy in a comprehensive inpatient rehab setting. Physiatrist is providing close team supervision and 24 hour management of active medical problems listed below. Physiatrist and rehab team continue to assess barriers to discharge/monitor patient progress toward functional and medical  goals  Care Tool:  Bathing    Body parts bathed by patient: Chest, Abdomen, Right upper leg, Left upper leg, Face, Left arm   Body parts bathed by helper: Buttocks, Front perineal area, Left lower leg, Right lower leg, Right arm, Chest, Abdomen     Bathing assist Assist Level: Moderate Assistance - Patient 50 - 74%     Upper Body Dressing/Undressing Upper body dressing   What is the patient wearing?: Pull over shirt    Upper body assist Assist Level: Maximal Assistance - Patient 25 - 49%    Lower Body Dressing/Undressing Lower body dressing      What is the patient wearing?: Pants, Underwear/pull up     Lower body assist Assist for lower body dressing: Maximal Assistance - Patient 25 - 49%     Toileting Toileting    Toileting assist Assist for toileting: Moderate Assistance - Patient 50 - 74%     Transfers Chair/bed transfer  Transfers assist  Chair/bed transfer activity did not occur: Safety/medical concerns  Chair/bed transfer assist level: Moderate Assistance - Patient 50 - 74% Chair/bed transfer assistive device: Armrests, Walker   Locomotion Ambulation   Ambulation assist   Ambulation activity did not occur: Safety/medical concerns  Assist level: Moderate Assistance - Patient 50 - 74% Assistive device: Walker-rolling Max distance: 69ft  Walk 10 feet activity   Assist  Walk 10 feet activity did not occur: Safety/medical concerns        Walk 50 feet activity   Assist Walk 50 feet with 2 turns activity did not occur: Safety/medical concerns         Walk 150 feet activity   Assist Walk 150 feet activity did not occur: Safety/medical concerns         Walk 10 feet on uneven surface  activity   Assist Walk 10 feet on uneven surfaces activity did not occur: Safety/medical concerns         Wheelchair     Assist Is the patient using a wheelchair?: No             Wheelchair 50 feet with 2 turns  activity    Assist            Wheelchair 150 feet activity     Assist          Blood pressure 138/74, pulse 67, temperature 98.3 F (36.8 C), resp. rate 16, height 5\' 3"  (1.6 m), weight 64.5 kg, SpO2 95 %.  Medical Problem List and Plan: 1. Functional deficits secondary to posterior circulation stroke             -patient may shower             -ELOS/Goals: 10-14 days S-               -Continue CIR- PT, OT and SLP  2.  Antithrombotics: -DVT/anticoagulation:  Pharmaceutical: Other (comment)--Eliquis             -antiplatelet therapy: ASA added  3. Pain Management: Tylenol prn.  4. Mood: LCSW to follow for evaluation and support.              -antipsychotic agents: N/A 5. Neuropsych: This patient is not fully capable of making decisions on capable own behalf. 6. Skin/Wound Care: Routine pressure relief measures 7. Fluids/Electrolytes/Nutrition:  N/A 8. CAF/ICM s/p ICD: Monitor HR TID. Continue Eliquis and bisoprolol.             --monitor for symptoms with increase in activity.   12/10- HR is doing OK- 60s- con't regimen 9. H/o CVA with L-HP: Was independent without AD PTA. 10. HTN: Monitor BP TID--SBP goal 120-140 range per neur due to MCA/PCA stenosis             --continue bisoprolol and Avapro.  Vitals:   01/01/21 1952 01/02/21 0428  BP: 134/81 138/74  Pulse: 71 67  Resp: 16 16  Temp: 98.4 F (36.9 C) 98.3 F (36.8 C)  SpO2: 97% 95%  Controlled  12/18, amlodipine 5mg  since 12/10  12/11- BP low 381R systolic- con't regimen for now 11. Dysphagia: Continue D1, nectar liquids. Recheck BMET in am.             --does not like current diet. Discussed dysphagia and need for modified diet.   12/10- pt focusing on meal- con't regimen 12. T2DM: Hgb A1C- 6.0. Continue to hold metformin             --monitor BS ac/hs and use SSI for now.  CBG (last 3)  Recent Labs    01/01/21 1642 01/01/21 2120 01/02/21 0620  GLUCAP 176* 161* 128*    12/18 good control   13. Chronic intermittent insomnia: Will resume Melatonin (used 10 mg prn PTA) 14. Chronic congeseted cough: For 5-6 years and felt to be  due to allergies. --Will add Claritin.    15.  Lethargy/confusion- improved   labs ok, afebrile, no sedating meds   UCx no growth   LOS: 11 days A FACE TO FACE EVALUATION WAS PERFORMED  Charlett Blake 01/02/2021, 8:33 AM

## 2021-01-02 NOTE — Progress Notes (Addendum)
Physical Therapy Session Note  Patient Details  Name: Alicia Guerrero MRN: [id number] Date of Birth: December 12, 1935  Today's Date: 01/02/2021 PT Individual Time: 1052-1205 PT Individual Time Calculation (min): 73 min   Short Term Goals: Week 2:  PT Short Term Goal 1 (Week 2): Pt will maintain sitting blance w/min assist during functional activity x 5 min PT Short Term Goal 2 (Week 2): Pt will perform sit to stand to AD w/min assist consistently PT Short Term Goal 3 (Week 2): Pt will ambulate 1104f w/LRAD w/min assist  Skilled Therapeutic Interventions/Progress Updates: pt presented in bedon phone with family requesting new clothes. Once completed agreeable to therapy. Pt states some pain in L hand but did not rate. Rest breaks provided as needed with fatigue. Pt performed supine to sit with CGA and HOB slightly elevated, PTA donned shoes total A for time management. Performed Sit to stand to TIS with RW and min/modA with cues for erect posture. Pt transported to 4th floor rehab gym for time management. Particpated in gait traning 685fx 2 with heavy minA. Pt required assist for maintaining RW in close proximity and maintaining more erect posture as well as for increasing L step length. Pt with seated rest between bouts and on second bout was able to keep RW somewhat closer without physical assist. BP checked after ambulation 151/71 (93) HR 91. Pt provided with theraputic rest break while performing reaching task sliding"bugs" across table tray for forced use of LUE. BP checked after activity 105/66 (74). Pt then participated in toe taps to 2in step for weight shifting, posture, and coordination. Pt required min cues for increased BOS as well as posture. Pt was able to complete 2 x 10 bilaterally with min/modA. Pt then indicated increased concern that pants would get wet due to incontinence (pt had no other pairs of pants present). PTA transported pt back to room and performed ambulatory transfer from TIS  to elevated toilet with minA. Pt required verbal cues for sequencing when turning and assist for maintaining safe proximity to RW. PTA provided modA for LB clothing management, PTA changed brief and performed peri-care total A. Once in standing pt was able to assist with LB clothing management with mod near maBay [place] IslandsPt then ambulated to TIS with minA on straight path and modA during turn. Pt left in TIS at end of session with belt alarm on, call bell within reach and needs met.      Therapy Documentation Precautions:  Precautions Precautions: Fall, Other (comment) Precaution Comments: L side hemiparesis, left neglect, SBP goal 120-140 range Restrictions Weight Bearing Restrictions: No   Therapy/Group: Individual Therapy  [name] 01/02/2021, 4:19 PM

## 2021-01-03 DIAGNOSIS — I6330 Cerebral infarction due to thrombosis of unspecified cerebral artery: Secondary | ICD-10-CM

## 2021-01-03 DIAGNOSIS — E119 Type 2 diabetes mellitus without complications: Secondary | ICD-10-CM

## 2021-01-03 DIAGNOSIS — I10 Essential (primary) hypertension: Secondary | ICD-10-CM

## 2021-01-03 DIAGNOSIS — I69391 Dysphagia following cerebral infarction: Secondary | ICD-10-CM

## 2021-01-03 LAB — BASIC METABOLIC PANEL
Anion gap: 7 (ref 5–15)
BUN: 17 mg/dL (ref 8–23)
CO2: 28 mmol/L (ref 22–32)
Calcium: 9.2 mg/dL (ref 8.9–10.3)
Chloride: 103 mmol/L (ref 98–111)
Creatinine, Ser: 0.7 mg/dL (ref 0.44–1.00)
GFR, Estimated: >60 mL/min (ref >60)
Glucose, Bld: 113 mg/dL — ABNORMAL HIGH (ref 70–99)
Potassium: 3.6 mmol/L (ref 3.5–5.1)
Sodium: 138 mmol/L (ref 135–145)

## 2021-01-03 LAB — CBC
HCT: 40.9 % (ref 36.0–46.0)
Hemoglobin: 13.3 g/dL (ref 12.0–15.0)
MCH: 27.3 pg (ref 26.0–34.0)
MCHC: 32.5 g/dL (ref 30.0–36.0)
MCV: 83.8 fL (ref 80.0–100.0)
Platelets: 327 10*3/uL (ref 150–400)
RBC: 4.88 MIL/uL (ref 3.87–5.11)
RDW: 13.1 % (ref 11.5–15.5)
WBC: 9.3 10*3/uL (ref 4.0–10.5)
nRBC: 0 % (ref 0.0–0.2)

## 2021-01-03 LAB — GLUCOSE, CAPILLARY
Glucose-Capillary: 117 mg/dL — ABNORMAL HIGH (ref 70–99)
Glucose-Capillary: 146 mg/dL — ABNORMAL HIGH (ref 70–99)
Glucose-Capillary: 174 mg/dL — ABNORMAL HIGH (ref 70–99)
Glucose-Capillary: 121 mg/dL — ABNORMAL HIGH (ref 70–99)

## 2021-01-03 NOTE — Progress Notes (Signed)
Physical Therapy Session Note  Patient Details  Name: Alicia Guerrero MRN: [id number] Date of Birth: Jun 21, 1935  Today's Date: 01/03/2021 PT Individual Time: 1445-1555 PT Individual Time Calculation (min): 70 min   Short Term Goals: Week 2:  PT Short Term Goal 1 (Week 2): Pt will maintain sitting blance w/min assist during functional activity x 5 min PT Short Term Goal 2 (Week 2): Pt will perform sit to stand to AD w/min assist consistently PT Short Term Goal 3 (Week 2): Pt will ambulate 150ft w/LRAD w/min assist  Skilled Therapeutic Interventions/Progress Updates:    Patient received sitting up in wc, agreeable to PT. Hand off from SLP. She denies pain, but endorses fatigue. PT donning sneakers and socks TotalA for time and transporting to 4W gym for energy conservation. Patient ambulating 3x75ft with RW and MinA + intermittent assist for managing walker. Patient with tendency to keep walker too far anterior resulting in increasingly flexed posture. Patient also with discontinuous gait speed and L stride length. Multimodal cues for increased L step length and increased foot clearance. PT applying theraband across width of RW for visual cue of positioning within walker. This was only mildly successful as patient required cues to use the visual cue. Patient completing standing balance/endurance task with dual cog task matching playing cards. Board was biased to the left- she required additional cuing to scan completely to the L to hang the matching card. Overall, she required Mod cuing for accuracy. Patient completing 8 mins on NuStep using B LE for large amplitude reciprocal stepping. Patient returning to room in wc, ambulatory transfer to bed with MinA/ModA. Patient returning supine with MinA. Bed alarm on, call light within reach.   Therapy Documentation Precautions:  Precautions Precautions: Fall, Other (comment) Precaution Comments: L side hemiparesis, left neglect, SBP goal 120-140  range Restrictions Weight Bearing Restrictions: No    Therapy/Group: Individual Therapy  Karoline Caldwell, PT, DPT, CBIS  01/03/2021, 7:28 AM

## 2021-01-03 NOTE — Progress Notes (Signed)
Speech Language Pathology Daily Session Note  Patient Details  Name: Alicia Guerrero MRN: [id number] Date of Birth: 02/13/1935  Today's Date: 01/03/2021 SLP Individual Time: 57-1445 SLP Individual Time Calculation (min): 60 min  Short Term Goals: Week 2: SLP Short Term Goal 1 (Week 2): Patient will use external aids to orient to person/place/time/situation with sup A verbal cues SLP Short Term Goal 2 (Week 2): Patient will perform basic and familiar tasks for 15 minute intervals with min A verbal cues for redirection SLP Short Term Goal 3 (Week 2): Patient will complete basic, familiar tasks with min A verbal cues for functional problem solving SLP Short Term Goal 4 (Week 2): Patient will utilize speech intelligiblity strategies at sentence level to achieve 90% accuracy with sup A verbal cues SLP Short Term Goal 5 (Week 2): Patient will consume dysphagia 2 texture PO trials with improved mastication effectiveness and oral clearance and without overt s/sx of aspiration  Skilled Therapeutic Interventions: Skilled ST treatment focused on cognitive and swallowing goals. Patient used external aids to orient to time with min A verbal cues. She was oriented to person independently, and place/situation with mod I for use of external aid. Speech continues to progress and patient was perceived as 90% intelligible at conversation level with mod I for implementation of speech intelligibility strategies. Pt completed oral care at sink with mod A and hand-over-hand. Patient accepted PO trials of ice chips without overt s/sx of aspiration. She consumed thin liquids (by tsp) with immediate cough response during 50% of trials. Cough was perceived as strong. Patient also consumed dysphagia 2 texture trials with slow and prolonged mastication, mild pocketing on left, and oral residuals in which patient cleared with min A verbal cues to perform lingual sweep and implement nectar thick liquid rinse. Pt exhibited  delayed throat clear x1 with dys 2 textures. Pt does not yet appear ready for diet advancement. Recommend continuation of dysphagia 1 diet/nectar thick liquids. Continue PO trials with SLP. Patient was passed off to PT at end of session. Continue per current plan of care.      Pain  No pain  Therapy/Group: Individual Therapy  Patty Sermons 01/03/2021, 2:39 PM

## 2021-01-03 NOTE — Progress Notes (Signed)
PROGRESS NOTE   Subjective/Complaints: No problems overnight. About to use bathroom  ROS: Patient denies fever, rash, sore throat, blurred vision, nausea, vomiting, diarrhea, cough, shortness of breath or chest pain, joint or back pain, headache, or mood change.    Objective:   No results found. Recent Labs    01/03/21 0632  WBC 9.3  HGB 13.3  HCT 40.9  PLT 327    Recent Labs    01/03/21 0632  NA 138  K 3.6  CL 103  CO2 28  GLUCOSE 113*  BUN 17  CREATININE 0.70  CALCIUM 9.2     Intake/Output Summary (Last 24 hours) at 01/03/2021 1223 Last data filed at 01/03/2021 2202 Gross per 24 hour  Intake 420 ml  Output --  Net 420 ml          Physical Exam: Vital Signs Blood pressure (!) 143/74, pulse 65, temperature 97.9 F (36.6 C), temperature source Oral, resp. rate 16, height 5\' 3"  (1.6 m), weight 64.5 kg, SpO2 98 %.   Constitutional: No distress . Vital signs reviewed. HEENT: NCAT, EOMI, oral membranes moist Neck: supple Cardiovascular: RRR without murmur. No JVD    Respiratory/Chest: CTA Bilaterally without wheezes or rales. Normal effort    GI/Abdomen: BS +, non-tender, non-distended Ext: no clubbing, cyanosis, or edema Psych: pleasant and cooperative  Skin: No evidence of breakdown, no evidence of rash Neurologic: Oriented to person , Zacarias Pontes, Month and situation (Stroke)  Cranial nerves II through XII intact, motor strength is 5/5 in Right deltoid, bicep, tricep, grip, hip flexor, knee extensors, ankle dorsiflexor and plantar flexor, 2- Left delt bic , tri, 2 grip, 3-4/5 L HF, KE ADF Sensory exam normal sensation to light touch and proprioception in bilateral upper and lower extremities  Musculoskeletal: Full range of motion in all 4 extremities. No joint swelling    Assessment/Plan: 1. Functional deficits which require 3+ hours per day of interdisciplinary therapy in a comprehensive  inpatient rehab setting. Physiatrist is providing close team supervision and 24 hour management of active medical problems listed below. Physiatrist and rehab team continue to assess barriers to discharge/monitor patient progress toward functional and medical goals  Care Tool:  Bathing    Body parts bathed by patient: Chest, Abdomen, Right upper leg, Left upper leg, Face, Left arm   Body parts bathed by helper: Buttocks, Front perineal area, Left lower leg, Right lower leg, Right arm, Chest, Abdomen     Bathing assist Assist Level: Moderate Assistance - Patient 50 - 74%     Upper Body Dressing/Undressing Upper body dressing   What is the patient wearing?: Pull over shirt    Upper body assist Assist Level: Maximal Assistance - Patient 25 - 49%    Lower Body Dressing/Undressing Lower body dressing      What is the patient wearing?: Pants, Underwear/pull up     Lower body assist Assist for lower body dressing: Maximal Assistance - Patient 25 - 49%     Toileting Toileting    Toileting assist Assist for toileting: Moderate Assistance - Patient 50 - 74%     Transfers Chair/bed transfer  Transfers assist  Chair/bed transfer activity did  not occur: Safety/medical concerns  Chair/bed transfer assist level: Moderate Assistance - Patient 50 - 74% Chair/bed transfer assistive device: Armrests, Walker   Locomotion Ambulation   Ambulation assist   Ambulation activity did not occur: Safety/medical concerns  Assist level: Moderate Assistance - Patient 50 - 74% Assistive device: Walker-rolling Max distance: 37ft   Walk 10 feet activity   Assist  Walk 10 feet activity did not occur: Safety/medical concerns  Assist level: Moderate Assistance - Patient - 50 - 74% Assistive device: Walker-rolling   Walk 50 feet activity   Assist Walk 50 feet with 2 turns activity did not occur: Safety/medical concerns  Assist level: Moderate Assistance - Patient - 50 -  74% Assistive device: Walker-rolling    Walk 150 feet activity   Assist Walk 150 feet activity did not occur: Safety/medical concerns         Walk 10 feet on uneven surface  activity   Assist Walk 10 feet on uneven surfaces activity did not occur: Safety/medical concerns         Wheelchair     Assist Is the patient using a wheelchair?: No             Wheelchair 50 feet with 2 turns activity    Assist            Wheelchair 150 feet activity     Assist          Blood pressure (!) 143/74, pulse 65, temperature 97.9 F (36.6 C), temperature source Oral, resp. rate 16, height 5\' 3"  (1.6 m), weight 64.5 kg, SpO2 98 %.  Medical Problem List and Plan: 1. Functional deficits secondary to posterior circulation stroke             -patient may shower             -ELOS/Goals: 10-14 days S-             -Continue CIR therapies including PT, OT, and SLP   2.  Antithrombotics: -DVT/anticoagulation:  Pharmaceutical: Other (comment)--Eliquis             -antiplatelet therapy: ASA added  3. Pain Management: Tylenol prn.  4. Mood: LCSW to follow for evaluation and support.              -antipsychotic agents: N/A 5. Neuropsych: This patient is not fully capable of making decisions on capable own behalf. 6. Skin/Wound Care: Routine pressure relief measures 7. Fluids/Electrolytes/Nutrition:  encourage PO  I personally reviewed the patient's labs today.  WNL 8. CAF/ICM s/p ICD: Monitor HR TID. Continue Eliquis and bisoprolol.             --monitor for symptoms with increase in activity.   12/10- HR is doing OK- 60s- con't regimen 9. H/o CVA with L-HP: Was independent without AD PTA. 10. HTN: Monitor BP TID--SBP goal 120-140 range per neur due to MCA/PCA stenosis             --continue bisoprolol and Avapro.  Vitals:   01/03/21 0433 01/03/21 0504  BP: (!) 162/81 (!) 143/74  Pulse: 64 65  Resp: 18 16  Temp: (!) 97.5 F (36.4 C) 97.9 F (36.6 C)  SpO2:  97% 98%  Controlled  12/19, amlodipine 5mg  since 12/10  12/11- BP low 518A systolic- con't regimen for now 11. Dysphagia: Continue D1, nectar liquids.  .             -12/19 encouraged her to push through diet until advanced  12. T2DM: Hgb A1C- 6.0. Continue to hold metformin             --monitor BS ac/hs and use SSI for now.  CBG (last 3)  Recent Labs    01/02/21 2058 01/03/21 0622 01/03/21 1203  GLUCAP 133* 117* 146*   12/19 reasonable control  13. Chronic intermittent insomnia: Will resume Melatonin (used 10 mg prn PTA) 14. Chronic congeseted cough: For 5-6 years and felt to be due to allergies. --added Claritin.    15.  Lethargy/confusion- resolved   labs ok, afebrile, no sedating meds   UCx no growth   LOS: 12 days A FACE TO FACE EVALUATION WAS PERFORMED  Meredith Staggers 01/03/2021, 12:23 PM

## 2021-01-03 NOTE — Progress Notes (Signed)
Occupational Therapy Session Note  Patient Details  Name: Alicia Guerrero MRN: [id number] Date of Birth: 02-05-1935  Today's Date: 01/03/2021 OT Individual Time: 1106-1200 OT Individual Time Calculation (min): 54 min    Short Term Goals: Week 2:  OT Short Term Goal 1 (Week 2): Pt will complete stand pivot transfer to the 3:1 with use of the RW and min assist for 2/3 trials. OT Short Term Goal 2 (Week 2): Pt will complete UB dressing with no more than mod assist for a pullover garment. OT Short Term Goal 3 (Week 2): Pt will complete LB bathing sit to stand with mod assist. OT Short Term Goal 4 (Week 2): Pt will use the LUE at a gross assist level with min assist during selfcare tasks. OT Short Term Goal 5 (Week 2): Pt will complete UB bathing with min assist in unsupported sitting.  Skilled Therapeutic Interventions/Progress Updates:    Pt completed dressing from the wheelchair to start session.  She was able to complete donning her pullover gown with max assist and then donned a pullover sweatshirt at mod assist level following hemi techniques.  Mod assist was needed for donning pull up pants as well.  Once complete, therapist gave her a brush where she was able to brush her hair in sitting with setup.  Therapist next took her over to the therapy gym where she worked on Research scientist (life sciences).  Therapist had her work on reaching for clothespins with the non-involved RUE while supporting herself with the LUE.  Transitioned from sitting while reaching to squatting position with min assist to reach.  Progressed to using the LUE with mod assist to pick up and place clothespins in the container placed at knee height as well as at shoulder height. Finished session with static standing balance in front of the chair with min to mod assist secondary to right and posterior lean.  She was able to stand statically for 2 mins before sitting in order to return to the room.  Pt remained up in the  wheelchair for lunch.  Call button and phone in reach on the right side with safety belt in place and pillow supporting the LUE.     Therapy Documentation Precautions:  Precautions Precautions: Fall, Other (comment) Precaution Comments: L side hemiparesis, left neglect, SBP goal 120-140 range Restrictions Weight Bearing Restrictions: No  Pain: Pain Assessment Pain Scale: Faces Pain Score: 0-No pain ADL: See Care Tool Section for some details of mobility and selfcare   Therapy/Group: Individual Therapy  Madeleine Fenn OTR/L 01/03/2021, 3:10 PM

## 2021-01-04 LAB — GLUCOSE, CAPILLARY
Glucose-Capillary: 112 mg/dL — ABNORMAL HIGH (ref 70–99)
Glucose-Capillary: 164 mg/dL — ABNORMAL HIGH (ref 70–99)
Glucose-Capillary: 161 mg/dL — ABNORMAL HIGH (ref 70–99)
Glucose-Capillary: 187 mg/dL — ABNORMAL HIGH (ref 70–99)

## 2021-01-04 NOTE — Progress Notes (Signed)
Speech Language Pathology Daily Session Note  Patient Details  Name: Alicia Guerrero MRN: [id number] Date of Birth: 1935-09-06  Today's Date: 01/04/2021 SLP Individual Time: 3419-6222 SLP Individual Time Calculation (min): 45 min  Short Term Goals: Week 2: SLP Short Term Goal 1 (Week 2): Patient will use external aids to orient to person/place/time/situation with sup A verbal cues SLP Short Term Goal 2 (Week 2): Patient will perform basic and familiar tasks for 15 minute intervals with min A verbal cues for redirection SLP Short Term Goal 3 (Week 2): Patient will complete basic, familiar tasks with min A verbal cues for functional problem solving SLP Short Term Goal 4 (Week 2): Patient will utilize speech intelligiblity strategies at sentence level to achieve 90% accuracy with sup A verbal cues SLP Short Term Goal 5 (Week 2): Patient will consume dysphagia 2 texture PO trials with improved mastication effectiveness and oral clearance and without overt s/sx of aspiration  Skilled Therapeutic Interventions: Skilled ST treatment focused on swallowing and cognitive goals. Patient was oriented x4 with sup A verbal cues to use external aid for identifying correct year (has tendency to state "1922" instead of 2022.) SLP facilitated Dysphagia 2 PO trials in which patient consumed with mildly prolonged yet effective mastication, trace oral residue, and without overt s/sx of aspiration. Pt was aware of oral residue and initiated clearance including lingual sweep and nectar thick liquid rinse. Pt has also consistently displayed cautious eating behaviors and implements safe swallowing precautions with modified independence-to-supervision A verbal cues. Pt is clinically appropriate for diet advancement to dysphagia 2 textures, nectar thick liquids. Continue full supervision to maximize swallow safety. Patient was educated and verbalized agreement with diet advancement. Patient requested to change out of her  pajamas and completed upper and lower body dressing at bed level with occasional repetition of verbal cues for following and executing 1-2 step directions. Patient was left in bed with alarm activated and immediate needs within reach at end of session. Continue per current plan of care.      Pain Pain Assessment Pain Scale: 0-10 Pain Score: 0-No pain  Therapy/Group: Individual Therapy  Anthoney Sheppard T Roy Snuffer 01/04/2021, 10:10 AM

## 2021-01-04 NOTE — Plan of Care (Signed)
°  Problem: RH Expression Communication Goal: LTG Patient will increase speech intelligibility (SLP) Description: LTG: Patient will increase speech intelligibility at word/phrase/conversation level with cues, % of the time (SLP) Flowsheets (Taken 01/04/2021 1721) LTG: Patient will increase speech intelligibility (SLP): Modified Independent Note: Goal advanced due to consistent functional gains   Problem: RH Problem Solving Goal: LTG Patient will demonstrate problem solving for (SLP) Description: LTG:  Patient will demonstrate problem solving for basic/complex daily situations with cues  (SLP) Flowsheets (Taken 01/04/2021 1721) LTG Patient will demonstrate problem solving for: Supervision Note: Goal advanced due to consistent functional gains

## 2021-01-04 NOTE — Progress Notes (Signed)
Occupational Therapy Session Note  Patient Details  Name: Alicia Guerrero MRN: [id number] Date of Birth: 1935-05-30  Today's Date: 01/04/2021 OT Individual Time: 1104-1200 OT Individual Time Calculation (min): 56 min    Short Term Goals: Week 2:  OT Short Term Goal 1 (Week 2): Pt will complete stand pivot transfer to the 3:1 with use of the RW and min assist for 2/3 trials. OT Short Term Goal 2 (Week 2): Pt will complete UB dressing with no more than mod assist for a pullover garment. OT Short Term Goal 3 (Week 2): Pt will complete LB bathing sit to stand with mod assist. OT Short Term Goal 4 (Week 2): Pt will use the LUE at a gross assist level with min assist during selfcare tasks. OT Short Term Goal 5 (Week 2): Pt will complete UB bathing with min assist in unsupported sitting.  Skilled Therapeutic Interventions/Progress Updates:  Pt greeted supine in bed  agreeable to OT intervention. Session focus on functional mobility, dynamic standing balance, LUE NMR and decreasing overall burden of care.  Pt completed supine>sit with MIN A needed to elevate trunk, MIN A for sit<>stand and stand pivot transfer from EOB>TIS. Pt transported to gym with total A for time mgmt. Worked on lap slides with LUE to promote scapular protraction/retraction as well as shoulder shrugs to work on scapular elevation/ depression. 2x10 reps Encouraged pt to work on all therex in her room to promote improved LUE AROM.  Worked on dynamic standing balance with pt standing from w/c with MIN A, CGA needed to reach out of BOS with RUE to retrieve cards while WB thru LUE for NMR. Pt completed task with rw and overall CGA. Additionally worked on [employer] with LUE with pt standing at hi low mat table, pt sit<>stand with MIN A, pt instructed to use RUE to "wash table" via shoulder flexion/extension while pt supporting herself with LUE providing NMR to LUE. Pt able to stand for ~ 30 secs before needing to sit. Pt completed second trial  with same assist level but this time completing horizontal shoulder ABD/ADD with RUE with able to stand for 1 min and 3 secs. VSS O2 93% on RA and HR 65 bpm. Pt completed 3rd trial for 1 min and 43 secs with pt completed shoulder circumduction with RUE in standing. Pt transported back to room with total A.   Where pt left up in w/c with alarm  belt activated and all needs within reach.                       Therapy Documentation Precautions:  Precautions Precautions: Fall, Other (comment) Precaution Comments: L side hemiparesis, left neglect, SBP goal 120-140 range Restrictions Weight Bearing Restrictions: No  Pain: 9/10 pain reported in L palmar aspect of pts hand, provided repositioning, rest breaks and light stretching as pain mgmt strategy.     Therapy/Group: Individual Therapy  Precious Haws 01/04/2021, 12:13 PM

## 2021-01-04 NOTE — Progress Notes (Signed)
Speech Language Pathology Weekly Progress and Session Note  Patient Details  Name: Alicia Guerrero MRN: [id number] Date of Birth: 03-10-1935  Beginning of progress report period: December 30, 2020 End of progress report period: January 05, 2021  Today's Date: 01/05/2021 SLP Individual Time: 0800-0900 SLP Individual Time Calculation (min): 60 min  Short Term Goals: Week 2: SLP Short Term Goal 1 (Week 2): Patient will use external aids to orient to person/place/time/situation with sup A verbal cues SLP Short Term Goal 1 - Progress (Week 2): Progressing toward goal SLP Short Term Goal 2 (Week 2): Patient will perform basic and familiar tasks for 15 minute intervals with min A verbal cues for redirection SLP Short Term Goal 2 - Progress (Week 2): Met SLP Short Term Goal 3 (Week 2): Patient will complete basic, familiar tasks with min A verbal cues for functional problem solving SLP Short Term Goal 3 - Progress (Week 2): Met SLP Short Term Goal 4 (Week 2): Patient will utilize speech intelligiblity strategies at sentence level to achieve 90% accuracy with sup A verbal cues SLP Short Term Goal 4 - Progress (Week 2): Met SLP Short Term Goal 5 (Week 2): Patient will consume dysphagia 2 texture PO trials with improved mastication effectiveness and oral clearance and without overt s/sx of aspiration SLP Short Term Goal 5 - Progress (Week 2): Met  New Short Term Goals: Week 3: SLP Short Term Goal 1 (Week 3): Patient will use external aids to orient to person/place/time/situation with sup A verbal cues SLP Short Term Goal 2 (Week 3): Patient will demonstrate selective attention by participating in functional tasks within a mildly distracting environment with only min A cues to redirect SLP Short Term Goal 3 (Week 3): Patient will mildly complex problem solving with sup A verbal cues SLP Short Term Goal 4 (Week 3): Patient will consume current diet without overt s/sx of aspiration and supervision  assist for implementation of safe swallowing strategies  Weekly Progress Updates: Patient has made excellent progress and has met 4 of 5 STGs this reporting period. Currently, patient demonstrates improved sustained and selective attention, and basic problem solving requiring overall supervision-to-min A cues to complete tasks accurately and safely. Pt's orientation to time continues to fluctuate and she requires reminders to refer to external aids as support. Patient has been consistent with speech intelligibility strategies to achieve intelligible speech at conversational level with modified independence for use of strategies. Patient also demonstrates improved swallow function and was advanced to dysphagia 2 diet and nectar thick liquids in which she is tolerating well with sup A verbal cues to monitor bite size and rate of consumption. She is participating in thin liquid (water) trials by tsp with SLP only following oral care. Plan to continue trials prior to considering repeat instrumental assessment. Patient and family education is ongoing. Patient would benefit from continued skilled SLP intervention to maximize cognitive, speech, and swallow functioning and overall functional independence prior to discharge. pt is expected to continue to improve with cognition with ongoing skilled SLP intervention.    Intensity: Minumum of 1-2 x/day, 30 to 90 minutes Frequency: 3 to 5 out of 7 days Duration/Length of Stay: 1/4 Treatment/Interventions: Cognitive remediation/compensation;Cueing hierarchy;Dysphagia/aspiration precaution training;Environmental controls;Functional tasks;Patient/family education;Therapeutic Activities  Daily Session Skilled Therapeutic Interventions: Skilled ST treatment focused on swallowing and cognitive goals. Patient seen with consumption of morning meal to assess tolerance of recent diet advancement to dysphagia 2 textures, nectar thick liquids. Patient consumed meal with mildly  delayed oral prep  however when given min cues to reduce rate of consumption and bolus volume this appeared to improve when not challenged by larger bolus volumes. Following initial cues, patient was able to implement safe swallowing with intermittent cues. Patient consumed meal without overt s/sx of aspiration. Continue current diet. Patient was oriented to person/place/situation but required min A verbal cues to orient to time (time of day - am/pm, dow, month, year). Facilitated discussion on strategies to better orient to time of day using contextual cues (looking out window at daylight), and referring to any sources that depict am/pm such as pt's cell phone. Patient required min A for sustained attention and verbal reasoning for generating words based on initial letter and category. Patient continues to exhibit significant distractibility to any external stimuli and most successful when completing tasks in a low stimulation environment. Patient was left in wheelchair with alarm activated and immediate needs within reach at end of session. Continue per current plan of care.     General    Pain  No pain  Therapy/Group: Individual Therapy  Patty Sermons 01/05/2021, 12:34 PM

## 2021-01-04 NOTE — Progress Notes (Signed)
PROGRESS NOTE   Subjective/Complaints: No issues overnite Reviewed labs Good appetite no cough with meal   ROS: Patient denies CP, SOB< N/V/D    Objective:   No results found. Recent Labs    01/03/21 0632  WBC 9.3  HGB 13.3  HCT 40.9  PLT 327     Recent Labs    01/03/21 0632  NA 138  K 3.6  CL 103  CO2 28  GLUCOSE 113*  BUN 17  CREATININE 0.70  CALCIUM 9.2      Intake/Output Summary (Last 24 hours) at 01/04/2021 2924 Last data filed at 01/03/2021 1839 Gross per 24 hour  Intake 420 ml  Output --  Net 420 ml           Physical Exam: Vital Signs Blood pressure (!) 153/81, pulse 64, temperature 98.4 F (36.9 C), resp. rate 17, height 5\' 3"  (1.6 m), weight 64.5 kg, SpO2 95 %.  General: No acute distress Mood and affect are appropriate Heart: Regular rate and rhythm no rubs murmurs or extra sounds Lungs: Clear to auscultation, breathing unlabored, no rales or wheezes Abdomen: Positive bowel sounds, soft nontender to palpation, nondistended Extremities: No clubbing, cyanosis, or edema Skin: No evidence of breakdown, no evidence of rash   Skin: No evidence of breakdown, no evidence of rash Neurologic: Oriented to person , Zacarias Pontes, Month and situation (Stroke)  Cranial nerves II through XII intact, motor strength is 5/5 in Right deltoid, bicep, tricep, grip, hip flexor, knee extensors, ankle dorsiflexor and plantar flexor, 2- Left delt bic , tri, 2 grip, 3-4/5 L HF, KE ADF Sensory exam normal sensation to light touch and proprioception in bilateral upper and lower extremities  Musculoskeletal: Full range of motion in all 4 extremities. No joint swelling    Assessment/Plan: 1. Functional deficits which require 3+ hours per day of interdisciplinary therapy in a comprehensive inpatient rehab setting. Physiatrist is providing close team supervision and 24 hour management of active medical  problems listed below. Physiatrist and rehab team continue to assess barriers to discharge/monitor patient progress toward functional and medical goals  Care Tool:  Bathing    Body parts bathed by patient: Chest, Abdomen, Right upper leg, Left upper leg, Face, Left arm   Body parts bathed by helper: Buttocks, Front perineal area, Left lower leg, Right lower leg, Right arm, Chest, Abdomen     Bathing assist Assist Level: Moderate Assistance - Patient 50 - 74%     Upper Body Dressing/Undressing Upper body dressing   What is the patient wearing?: Pull over shirt    Upper body assist Assist Level: Maximal Assistance - Patient 25 - 49%    Lower Body Dressing/Undressing Lower body dressing      What is the patient wearing?: Pants, Underwear/pull up     Lower body assist Assist for lower body dressing: Maximal Assistance - Patient 25 - 49%     Toileting Toileting    Toileting assist Assist for toileting: Moderate Assistance - Patient 50 - 74%     Transfers Chair/bed transfer  Transfers assist  Chair/bed transfer activity did not occur: Safety/medical concerns  Chair/bed transfer assist level: Moderate Assistance - Patient  50 - 74% Chair/bed transfer assistive device: Armrests, Programmer, multimedia   Ambulation assist   Ambulation activity did not occur: Safety/medical concerns  Assist level: Moderate Assistance - Patient 50 - 74% Assistive device: Walker-rolling Max distance: 63ft   Walk 10 feet activity   Assist  Walk 10 feet activity did not occur: Safety/medical concerns  Assist level: Moderate Assistance - Patient - 50 - 74% Assistive device: Walker-rolling   Walk 50 feet activity   Assist Walk 50 feet with 2 turns activity did not occur: Safety/medical concerns  Assist level: Moderate Assistance - Patient - 50 - 74% Assistive device: Walker-rolling    Walk 150 feet activity   Assist Walk 150 feet activity did not occur:  Safety/medical concerns         Walk 10 feet on uneven surface  activity   Assist Walk 10 feet on uneven surfaces activity did not occur: Safety/medical concerns         Wheelchair     Assist Is the patient using a wheelchair?: No             Wheelchair 50 feet with 2 turns activity    Assist            Wheelchair 150 feet activity     Assist          Blood pressure (!) 153/81, pulse 64, temperature 98.4 F (36.9 C), resp. rate 17, height 5\' 3"  (1.6 m), weight 64.5 kg, SpO2 95 %.  Medical Problem List and Plan: 1. Functional deficits secondary to posterior circulation stroke             -patient may shower             -ELOS/Goals: 10-14 days S-             -Continue CIR therapies including PT, OT, and SLP   2.  Antithrombotics: -DVT/anticoagulation:  Pharmaceutical: Other (comment)--Eliquis             -antiplatelet therapy: ASA added  3. Pain Management: Tylenol prn.  4. Mood: LCSW to follow for evaluation and support.              -antipsychotic agents: N/A 5. Neuropsych: This patient is not fully capable of making decisions on capable own behalf. 6. Skin/Wound Care: Routine pressure relief measures 7. Fluids/Electrolytes/Nutrition:  encourage PO  I personally reviewed the patient's labs today.  WNL 8. CAF/ICM s/p ICD: Monitor HR TID. Continue Eliquis and bisoprolol.             --monitor for symptoms with increase in activity.   12/10- HR is doing OK- 60s- con't regimen 9. H/o CVA with L-HP: Was independent without AD PTA. 10. HTN: Monitor BP TID--SBP goal 120-140 range per neur due to MCA/PCA stenosis             --continue bisoprolol and Avapro.  Vitals:   01/03/21 2051 01/04/21 0444  BP: 135/75 (!) 153/81  Pulse: 65 64  Resp: 17 17  Temp: 98.4 F (36.9 C) 98.4 F (36.9 C)  SpO2: 94% 95%  Elevated  12/20.  amlodipine 5mg  since 12/10, monitor prior to further dose adjustments  12/11- BP low 829F systolic- con't regimen for  now 11. Dysphagia: Continue D1, nectar liquids.  .             -12/19 encouraged her to push through diet until advanced 12. T2DM: Hgb A1C- 6.0. Continue to hold metformin             --  monitor BS ac/hs and use SSI for now.  CBG (last 3)  Recent Labs    01/03/21 1627 01/03/21 2052 01/04/21 0549  GLUCAP 174* 121* 112*    12/20 good  control  13. Chronic intermittent insomnia: Will resume Melatonin (used 10 mg prn PTA) 14. Chronic congeseted cough: For 5-6 years and felt to be due to allergies. --added Claritin.    15.  Lethargy/confusion- resolved   labs ok, afebrile, no sedating meds   UCx no growth   LOS: 13 days A FACE TO FACE EVALUATION WAS PERFORMED  Charlett Blake 01/04/2021, 8:21 AM

## 2021-01-04 NOTE — Progress Notes (Signed)
Physical Therapy Session Note  Patient Details  Name: Alicia Guerrero MRN: [id number] Date of Birth: 1935/10/10  Today's Date: 01/04/2021 PT Individual Time: 1305-1401 PT Individual Time Calculation (min): 56 min   Short Term Goals: Week 2:  PT Short Term Goal 1 (Week 2): Pt will maintain sitting blance w/min assist during functional activity x 5 min PT Short Term Goal 2 (Week 2): Pt will perform sit to stand to AD w/min assist consistently PT Short Term Goal 3 (Week 2): Pt will ambulate 175ft w/LRAD w/min assist  Skilled Therapeutic Interventions/Progress Updates:    Pt received sitting in TIS w/c and agreeable to therapy session. Donned socks and tennis shoes total assist for time management.  Transported to/from gym in w/c for time management and energy conservation. Sit>stand w/c>RW with min for rising to stand - pt with good recall to push up with R hand from w/c - pt able to place L hand on RW orthosis without assist but requires assist to don/doff strap (with fatigue requires min assist to place hand on orthosis during session).  Gait training 133ft 2x using RW with min progressing to light mod assist for balance - continues to push AD too far forward and have difficulty turning it when navigating through doorways and when turning to sit in w/c due to L UE paresis - improved on 2nd walk - with fatigue does start to crouch down more, but significantly improved upright compared to previous sessions with this therapist - pt also has a more continuous, fluid forward gait pattern with reciprocal stepping (no significant pauses with improved selective attention to task).  Stair navigation training ascending/descending 8 steps (6" height) using B HR support - requires repeated assist to place L hand on HR as it frequently falls off and pt not able to advance it on rail - ascends reciprocally and descends with step-to leading with R LE (which is opposite of what would normally have pt do but  actually worked better) - min progressed to heavy mod assist due to progressively worsening L posterior lean - no signs of knee buckle but pt does do her gradual crouched/squat posturing requiring cuing to correct.   Gait training 156ft using RW with min assist progressed to heavier min assist - continues to be offset towards L side of RW and have L hip abducted/trendelenburged throughout - some improvement in AD management but continues to require max multimodal cuing for turning and sequencing LE stepping when going to sit.  Dynamic gait training using RW ~75ft forward then backwards - requires light mod assist for balance and AD management when stepping backwards due to posterior lean and poor sequencing of LE stepping and RW movements and impaired balance strategies.  Transported back to room and left seated in TIS w/c with needs in reach, seat belt alarm on, and L UE supported on pillow.   Therapy Documentation Precautions:  Precautions Precautions: Fall, Other (comment) Precaution Comments: L side hemiparesis, left neglect, SBP goal 120-140 range Restrictions Weight Bearing Restrictions: No   Pain: Denies pain during session.   Therapy/Group: Individual Therapy  Tawana Scale , PT, DPT, NCS, CSRS  01/04/2021, 12:24 PM

## 2021-01-04 NOTE — Progress Notes (Signed)
Occupational Therapy Session Note  Patient Details  Name: Alicia Guerrero MRN: [id number] Date of Birth: 1935-10-20  Today's Date: 01/04/2021 OT Individual Time: 1594-5859 OT Individual Time Calculation (min): 58 min    Short Term Goals: Week 2:  OT Short Term Goal 1 (Week 2): Pt will complete stand pivot transfer to the 3:1 with use of the RW and min assist for 2/3 trials. OT Short Term Goal 2 (Week 2): Pt will complete UB dressing with no more than mod assist for a pullover garment. OT Short Term Goal 3 (Week 2): Pt will complete LB bathing sit to stand with mod assist. OT Short Term Goal 4 (Week 2): Pt will use the LUE at a gross assist level with min assist during selfcare tasks. OT Short Term Goal 5 (Week 2): Pt will complete UB bathing with min assist in unsupported sitting.  Skilled Therapeutic Interventions/Progress Updates:    Pt up in tilt in space wheelchair to start without reports of pain when questioned.  She was able to state the correct day of the week while therapist transported her down to the therapy gym for session.  Min assist for standing balance at the kitchen counter while working on washing the countertop with the LUE.  Min assist for balance for two intervals of standing with mod demonstrational cueing to maintain left knee extension.  She was then able to transition to the therapy gym with transfer over to the mat.  Mod assist to complete stand pivot transfer.  Worked on sitting balance and maintaining anterior pelvic tilt with upright posture.  She needed mod facilitation to maintain while completing functional reach with the LUE.  She was able to pick up an place larger foam blocks with the LUE and mod assist, transitioning then from knee level to the mat beside of her as well as to the bedside table in front of her.  Mod demonstrational cueing to avoid shoulder hike on the left side.  Transitioned to use of the UE Ranger for work on sustained activation and control of  elbow extensors with external rotation of the shoulder.  Min to mod assist to maintain elbow extension with external rotation place and hold.  Transferred back to the wheelchair at conclusion of session at min assist level stand pivot and then to the bed once back in the room at the same level.  Call button and phone in reach with safety alarm in place.     Therapy Documentation Precautions:  Precautions Precautions: Fall, Other (comment) Precaution Comments: L side hemiparesis, left neglect, SBP goal 120-140 range Restrictions Weight Bearing Restrictions: No  Pain: Pain Assessment Pain Scale: Faces Pain Score: 0-No pain Faces Pain Scale: No hurt   Therapy/Group: Individual Therapy  Merlyn Bollen OTR/L 01/04/2021, 4:44 PM

## 2021-01-05 LAB — GLUCOSE, CAPILLARY
Glucose-Capillary: 131 mg/dL — ABNORMAL HIGH (ref 70–99)
Glucose-Capillary: 140 mg/dL — ABNORMAL HIGH (ref 70–99)
Glucose-Capillary: 149 mg/dL — ABNORMAL HIGH (ref 70–99)
Glucose-Capillary: 176 mg/dL — ABNORMAL HIGH (ref 70–99)

## 2021-01-05 NOTE — Progress Notes (Signed)
Patient rested well during the night. No c/o pain or discomfort. A&O x4, able to make needs known. Last BM on 01/03/21. Takes meds crushed in applesauce with nectar. Bed locked and lowered. Call bell within reach. Nurse will continue to monitor.

## 2021-01-05 NOTE — Progress Notes (Signed)
Physical Therapy Weekly Progress Note  Patient Details  Name: Alicia Guerrero MRN: [id number] Date of Birth: 1935-12-22  Beginning of progress report period: December 30, 2020 End of progress report period: January 05, 2021  Today's Date: 01/05/2021 PT Individual Time: 0920-1032 PT Individual Time Calculation (min): 72 min   Patient has met 1 of 3 short term goals. Ms. Wooldridge is making steady progress with therapy demonstrating increasing independence with functional mobility. She is performing supine<>sit min assist, dynamic sitting balance is at overall mod assist, sit<>stands and stand pivot transfers using RW with mod assist, gait up to 180f using RW with mod assist, and 4 stair navigation using B HRs with mod assist. Pt continues to have impaired selective/alternating attention resulting in a crouched/squatted posture with posterior lean when standing/ambulating and becoming distracted with environment with lack of balance recovery strategies requiring max/total assist to maintain upright. She continues to have L inattention though is improving significantly. Pt will benefit from continued CIR level therapies prior to D/Cing home with 24hr assistance.  Patient continues to demonstrate the following deficits muscle weakness, decreased cardiorespiratoy endurance, impaired timing and sequencing, abnormal tone, unbalanced muscle activation, and decreased coordination,  , decreased attention to left and decreased motor planning, decreased initiation, decreased attention, decreased awareness, decreased problem solving, decreased safety awareness, decreased memory, and delayed processing, and decreased sitting balance, decreased standing balance, decreased postural control, hemiplegia, and decreased balance strategies and therefore will continue to benefit from skilled PT intervention to increase functional independence with mobility.  Patient progressing toward long term goals..  Continue plan of  care.  PT Short Term Goals Week 2:  PT Short Term Goal 1 (Week 2): Pt will maintain sitting blance w/min assist during functional activity x 5 min PT Short Term Goal 1 - Progress (Week 2): Met PT Short Term Goal 2 (Week 2): Pt will perform sit to stand to AD w/min assist consistently PT Short Term Goal 2 - Progress (Week 2): Progressing toward goal PT Short Term Goal 3 (Week 2): Pt will ambulate 1041fw/LRAD w/min assist PT Short Term Goal 3 - Progress (Week 2): Progressing toward goal Week 3:  PT Short Term Goal 1 (Week 3): Pt will perform supine<>sit with min assist consistently PT Short Term Goal 2 (Week 3): Pt will perform sit<>stand using LRAD with min assist consistently PT Short Term Goal 3 (Week 3): Pt will perform bed<>chair transfers using LRAD with min assist consistently PT Short Term Goal 4 (Week 3): Pt will ambulate at least 5070fsing LRAD with min assist consistently (including some turning)  Skilled Therapeutic Interventions/Progress Updates:  Ambulation/gait training;Community reintegration;DME/adaptive equipment instruction;Neuromuscular re-education;Psychosocial support;Stair training;UE/LE Strength taining/ROM;Wheelchair propulsion/positioning;Balance/vestibular training;Discharge planning;Functional electrical stimulation;Pain management;Skin care/wound management;Therapeutic Activities;UE/LE Coordination activities;Cognitive remediation/compensation;Disease management/prevention;Functional mobility training;Patient/family education;Splinting/orthotics;Therapeutic Exercise;Visual/perceptual remediation/compensation   Pt received sitting in TIS w/c and agreeable to therapy session. Donned socks and tennis shoes total assist.  Transported to/from gym in w/c for time management and energy conservation.   Sit>stand w/c>RW with min assist - cuing and facilitation to push up with both hands on w/c armrest for L UE NMR (requires min assist to place hand on armrest) and to prevent pt  from pulling up on RW - pt able to place L hand on RW orthosis now and don strap with min cuing.  Gait training [id number] RW with min assist to light mod assist for balance and AD management - continues to have difficulty turning and directing AD due to L UE paresis  causing it to move too far to the R with her being on the L side of it - continues to progress towards a crouched/squatted posture towards end with fatigue - achieves reciprocal stepping pattern >90% of the time.  Gait training 64f 2x using L HHA only with mod assist for balance - demos worsening R lateral trunk lean with decreased and short L weight shift onto L stance phase - starts with more of a step-to pattern progressing to reciprocal pattern consistently on 2nd walk with therapist providing slight facilitation for R/L weight shifting onto stance limb - requires intermittent cuing to stand up straight due to progressing into that crouched posture.  Dynamic gait training side stepping ~117f2x with B HHA - requires progressively heavier mod assist for balance and maintaining upright due to strong posterior lean with pt going into the crouched/squatted posture and has poor R/L weight shifting onto stance limb to allow step with contralateral LE - requires manual facilitation for R/L weight shifting onto stance limb and step-by-step verbal cuing for sequencing LE stepping.  Ascended/descended 4 steps (6" height) 2x using B HRs - continues to require manual facilitation to place L hand on HR as it frequently will fall off - mod assist for balance as pt continues to have posterior lean with crouched posture - 1st trial ascended with reciprocal stepping and descends with step-to leading with R LE (this is better than when pt descends with L LE) - 2nd trial ascends step-to leading with L LE targeting strengthening and NMR and continues to descent with step-to leading with R LE - has more prominent posterior lean today requiring heavy mod assist  throughout .  Dynamic standing balance task with B HHA via alternating foot taps to 4" step - requires heavy mod assist for balance and intermittent max assist due to strong posterior lean with lack of balance recovery strategies - mirror feedback provided on 2nd set with visual dmonstration and verbal education on R/L weight shifting onto stance limb with decrease in posterior lean though still present and with lack of sufficient balance recovery strategy.  Transported back to room and pt requesting to return to bed. L stand pivot to EOB using UE support on bedrail with mod assist for balance and max cuing for stepping LE to turn prior to initiating sit. Sit>supine min assist. Pt left supine in bed with needs in reach, L UE supported on pillows, and bed alarm on.   Therapy Documentation Precautions:  Precautions Precautions: Fall, Other (comment) Precaution Comments: L side hemiparesis, left neglect, SBP goal 120-140 range Restrictions Weight Bearing Restrictions: No   Pain:  Denies pain during session.   Therapy/Group: Individual Therapy  CaTawana Scale PT, DPT, NCS, CSRS  01/05/2021, 7:52 AM

## 2021-01-05 NOTE — Progress Notes (Signed)
Patient ID: Alicia Guerrero, female   DOB: [birth year], 85 y.o.   MRN: [id number]  Team Conference Report to Patient/Family  Team Conference discussion was reviewed with the patient and caregiver, including goals, any changes in plan of care and target discharge date.  Patient and caregiver express understanding and are in agreement.  The patient has a target discharge date of 01/19/21.  Sw spoke with patient son and provided conference updates. Patient son reports they have several wheelchairs & rolling walkers and do not want the custom wheelchair. Family education scheduled on January 2nd & 3rd 9-12  Dyanne Iha 01/05/2021, 1:49 PM

## 2021-01-05 NOTE — Patient Care Conference (Signed)
Inpatient RehabilitationTeam Conference and Plan of Care Update Date: 01/05/2021   Time: 10:39 AM    Patient Name: Alicia Guerrero      Medical Record Number: [id number]  Date of Birth: 07-19-1935 Sex: Female         Room/Bed: 0S81J/0R15X-45 Payor Info: Payor: [employer] MEDICARE / Plan: [place] MEDICARE HMO/PPO / Product Type: *No Product type* /    Admit Date/Time:  12/22/2020  2:13 PM  Primary Diagnosis:  Stroke (cerebrum) [facility])  Hospital Problems: Principal Problem:   Stroke (cerebrum) [facility]) Active Problems:   Pressure injury of skin    Expected Discharge Date: Expected Discharge Date: 01/19/21  Team Members Present: Physician leading conference: Dr. Alysia Penna Social Worker Present: Erlene Quan, BSW Nurse Present: Dorien Chihuahua, RN PT Present: Page Spiro, PT OT Present: Clyda Greener, OT SLP Present: Sherren Kerns, SLP PPS Coordinator present : Gunnar Fusi, SLP     Current Status/Progress Goal Weekly Team Focus  Bowel/Bladder   Continent with episodes of incontinence last BM 12/19  to have less episodes of incontinence  Assess q shift and prn   Swallow/Nutrition/ Hydration   dys 2 diet, NTL, min A  sup A  Dys 2 trials (improving), ice chips and thin liquid trials by tsp (water only)   ADL's   Pt is currently min assist for UB bathing with mod assist for UB dressing.  Min assist for LB bathing with mod to max for LB dressing.  Transfers are mod assist level to ambulate to the toilet with use of the RW.  Mod assist for clothing and hygiene as well.  min assist level  selfcare retraining, transfer training, DME education, balance retraining, neuromuscular re-education therapeutic exercise   Mobility   mod assist bed mobility, min/mod assist sit<>stand and stand pivot transfers using RW, mod assist gait up to 142ft using RW with pt having poor midline awareness and requires manual assist to manage AD due to L UE paresis - continues to have posterior lean and  crouched posture that worsens with fatigue requiring up to max assist to maintain standing balance  min assist overall at ambulatory level  activity tolerance, pt education, L hemibody NMR, bed mobility training, transfer training, gait training, standing balance, dual-task training   Communication   mod I  mod I  speech intelligiblity strategies   Safety/Cognition/ Behavioral Observations  min A  sup-to-min A  attention, orientation using external aids, basic problem solving, emergent awareness (improving)   Pain   denies any pain  remain pain free  assess q shift & PRN   Skin   skin intact, some excoriation to the sacral area  skin to remain intact  assess q shift & PRN     Discharge Planning:  Discharging home with son and family to assist, Spouse unable to provide physical assistance   Team Discussion: Patient post stroke has trouble dual tasking and fatigue. Continent/incontinent at times with toileting. Arthritis pain addressed.   Patient on target to meet rehab goals: yes, currently needs min assist for upper body bathing and mod assist for dressing. Requires mod - max assist for lower body care and mod assist for toileting. Able to ambulate up to 138' with a crouched over stance and posterior lean. Manages a D2 nectar thick diet with min assist. Goals for discharge set for min assist overall.   *See Care Plan and progress notes for long and short-term goals.   Revisions to Treatment Plan:  N/A   Teaching Needs:  Safety, nutrition/dietary modifications, medication management, secondary risk management, transfers, etc.   Current Barriers to Discharge: Decreased caregiver support, Home enviroment access/layout, and Incontinence  Possible Resolutions to Barriers: Family education with spouse and son Recommend ramp for entry to home DME: Custom W/C and RW HH follow up services     Medical Summary Current Status: CBG controlled , some BP lability, alertness improving   Barriers to Discharge: Medical stability   Possible Resolutions to [employer] Focus: Cont NM re education LUE, gait/mobility training, work on orientation   Continued Need for Acute Rehabilitation Level of Care: The patient requires daily medical management by a physician with specialized training in physical medicine and rehabilitation for the following reasons: Direction of a multidisciplinary physical rehabilitation program to maximize functional independence : Yes Medical management of patient stability for increased activity during participation in an intensive rehabilitation regime.: Yes Analysis of laboratory values and/or radiology reports with any subsequent need for medication adjustment and/or medical intervention. : Yes   I attest that I was present, lead the team conference, and concur with the assessment and plan of the team.   Dorien Chihuahua B 01/05/2021, 2:59 PM

## 2021-01-05 NOTE — Progress Notes (Signed)
PROGRESS NOTE   Subjective/Complaints:   Working with SLP on orientation  Oriented to month, not year or day   ROS: Patient denies CP, SOB,N/V/D    Objective:   No results found. Recent Labs    01/03/21 0632  WBC 9.3  HGB 13.3  HCT 40.9  PLT 327     Recent Labs    01/03/21 0632  NA 138  K 3.6  CL 103  CO2 28  GLUCOSE 113*  BUN 17  CREATININE 0.70  CALCIUM 9.2      Intake/Output Summary (Last 24 hours) at 01/05/2021 0837 Last data filed at 01/05/2021 0200 Gross per 24 hour  Intake 780 ml  Output --  Net 780 ml           Physical Exam: Vital Signs Blood pressure (!) 152/70, pulse 66, temperature 98.3 F (36.8 C), temperature source Oral, resp. rate 18, height 5' 3"  (1.6 m), weight 64.5 kg, SpO2 95 %.  General: No acute distress Mood and affect are appropriate Heart: Regular rate and rhythm no rubs murmurs or extra sounds Lungs: Clear to auscultation, breathing unlabored, no rales or wheezes Abdomen: Positive bowel sounds, soft nontender to palpation, nondistended Extremities: No clubbing, cyanosis, or edema Skin: No evidence of breakdown, no evidence of rash    Skin: No evidence of breakdown, no evidence of rash Neurologic: Oriented to person , Zacarias Pontes, Month and situation (Stroke)  Cranial nerves II through XII intact, motor strength is 5/5 in Right deltoid, bicep, tricep, grip, hip flexor, knee extensors, ankle dorsiflexor and plantar flexor, 2- Left delt bic , tri, 2 grip, 3-4/5 L HF, KE ADF Sensory exam normal sensation to light touch and proprioception in bilateral upper and lower extremities  Musculoskeletal: Full range of motion in all 4 extremities. No joint swelling    Assessment/Plan: 1. Functional deficits which require 3+ hours per day of interdisciplinary therapy in a comprehensive inpatient rehab setting. Physiatrist is providing close team supervision and 24 hour  management of active medical problems listed below. Physiatrist and rehab team continue to assess barriers to discharge/monitor patient progress toward functional and medical goals  Care Tool:  Bathing    Body parts bathed by patient: Chest, Abdomen, Right upper leg, Left upper leg, Face, Left arm   Body parts bathed by helper: Buttocks, Front perineal area, Left lower leg, Right lower leg, Right arm, Chest, Abdomen     Bathing assist Assist Level: Moderate Assistance - Patient 50 - 74%     Upper Body Dressing/Undressing Upper body dressing   What is the patient wearing?: Pull over shirt    Upper body assist Assist Level: Maximal Assistance - Patient 25 - 49%    Lower Body Dressing/Undressing Lower body dressing      What is the patient wearing?: Pants, Underwear/pull up     Lower body assist Assist for lower body dressing: Maximal Assistance - Patient 25 - 49%     Toileting Toileting    Toileting assist Assist for toileting: Moderate Assistance - Patient 50 - 74%     Transfers Chair/bed transfer  Transfers assist  Chair/bed transfer activity did not occur: Safety/medical concerns  Chair/bed  transfer assist level: Moderate Assistance - Patient 50 - 74% Chair/bed transfer assistive device: Armrests, Walker   Locomotion Ambulation   Ambulation assist   Ambulation activity did not occur: Safety/medical concerns  Assist level: Moderate Assistance - Patient 50 - 74% Assistive device: Walker-rolling Max distance: 161f   Walk 10 feet activity   Assist  Walk 10 feet activity did not occur: Safety/medical concerns  Assist level: Moderate Assistance - Patient - 50 - 74% Assistive device: Walker-rolling   Walk 50 feet activity   Assist Walk 50 feet with 2 turns activity did not occur: Safety/medical concerns  Assist level: Moderate Assistance - Patient - 50 - 74% Assistive device: Walker-rolling    Walk 150 feet activity   Assist Walk 150 feet  activity did not occur: Safety/medical concerns         Walk 10 feet on uneven surface  activity   Assist Walk 10 feet on uneven surfaces activity did not occur: Safety/medical concerns         Wheelchair     Assist Is the patient using a wheelchair?: No             Wheelchair 50 feet with 2 turns activity    Assist            Wheelchair 150 feet activity     Assist          Blood pressure (!) 152/70, pulse 66, temperature 98.3 F (36.8 C), temperature source Oral, resp. rate 18, height 5' 3"  (1.6 m), weight 64.5 kg, SpO2 95 %.  Medical Problem List and Plan: 1. Functional deficits secondary to posterior circulation stroke             -patient may shower             -ELOS/Goals: 10-14 days S-             -Continue CIR therapies including PT, OT, and SLP  Team conference today please see physician documentation under team conference tab, met with team  to discuss problems,progress, and goals. Formulized individual treatment plan based on medical history, underlying problem and comorbidities.  2.  Antithrombotics: -DVT/anticoagulation:  Pharmaceutical: Other (comment)--Eliquis             -antiplatelet therapy: ASA added  3. Pain Management: Tylenol prn.  4. Mood: LCSW to follow for evaluation and support.              -antipsychotic agents: N/A 5. Neuropsych: This patient is not fully capable of making decisions on capable own behalf. 6. Skin/Wound Care: Routine pressure relief measures 7. Fluids/Electrolytes/Nutrition:  encourage PO  I personally reviewed the patient's labs today.  WNL 8. CAF/ICM s/p ICD: Monitor HR TID. Continue Eliquis and bisoprolol.             --monitor for symptoms with increase in activity.   12/10- HR is doing OK- 60s- con't regimen 9. H/o CVA with L-HP: Was independent without AD PTA. 10. HTN: Monitor BP TID--SBP goal 120-140 range per neur due to MCA/PCA stenosis             --continue bisoprolol and Avapro.   Vitals:   01/04/21 1943 01/05/21 0432  BP: (!) 110/57 (!) 152/70  Pulse: 66 66  Resp: 18 18  Temp: (!) 97.4 F (36.3 C) 98.3 F (36.8 C)  SpO2: 97% 95%  Labile  12/20-12/22.  amlodipine 528msince 12/10,   12/11- BP low [id number]- con't regimen for now 11.  Dysphagia: Continue D1, nectar liquids.  .             -12/19 encouraged her to push through diet until advanced 12. T2DM: Hgb A1C- 6.0. Continue to hold metformin             --monitor BS ac/hs and use SSI for now.  CBG (last 3)  Recent Labs    01/04/21 1627 01/04/21 2108 01/05/21 0604  GLUCAP 161* 187* 131*    12/21 good  control  13. Chronic intermittent insomnia: Will resume Melatonin (used 10 mg prn PTA) 14. Chronic congeseted cough: For 5-6 years and felt to be due to allergies. --added Claritin.    15.  Lethargy/confusion- resolved   labs ok, afebrile, no sedating meds   UCx no growth   LOS: 14 days A FACE TO FACE EVALUATION WAS PERFORMED  Charlett Blake 01/05/2021, 8:37 AM

## 2021-01-05 NOTE — Progress Notes (Signed)
Occupational Therapy Weekly Progress Note  Patient Details  Name: Alicia Guerrero MRN: [id number] Date of Birth: March 16, 1935  Beginning of progress report period: December 30, 2020 End of progress report period: January 05, 2021  Today's Date: 01/05/2021 OT Individual Time: 4627-0350 OT Individual Time Calculation (min): 73 min    Patient has met 2 of 5 short term goals.  Alicia Guerrero is making steady progress with OT at this time.  She currently completes UB bathing at min assist in supported sitting, but needs mod assist when unsupported secondary to decreased dynamic sitting balance.  In sitting, she continues to demonstrate significant posterior pelvic tilt with posterior LOB at times, requiring mod to max demonstrational cueing to maintain upright posture when dividing her attention between selfcare tasks.  UB dressing is still at a max assist level with LB bathing at mod assist sit to stand.  She is able to also complete LB dressing at mod assist level with mod instructional cueing for following hemi dressing techniques.  She is able to complete toilet transfers at [facility] assist level with the RW to the 3:1.  Mod assist is also needed for clothing management and toilet hygiene as well.  LUE functional use is still limited, however she is able to use it at a gross assist level with min guard assist when holding items to be opened.  She needs mod to max assist to integrate as an active assist for washing the RUE.  Pain has also decreased at the thumb [facility] joint compared to last week as well.  She demonstrates improved awareness of her deficits compared to last week resulting in an increase in intellectual awareness.  Left attention has also improved as she will spontaneously scan left of midline to locating items during selfcare tasks or during self feeding.  Overall feel she is on target for established min assist level goals with expected discharge on 1/4.  Recommend continued CIR level therapy to  progress to this level.  Will continue to provide family/pt education as appropriate.    Patient continues to demonstrate the following deficits: muscle weakness and muscle paralysis, impaired timing and sequencing, unbalanced muscle activation, and decreased coordination, decreased attention to left, decreased attention, decreased awareness, decreased problem solving, decreased safety awareness, decreased memory, and delayed processing, and decreased sitting balance, decreased standing balance, decreased postural control, hemiplegia, and decreased balance strategies and therefore will continue to benefit from skilled OT intervention to enhance overall performance with BADL.  Patient progressing toward long term goals..  Continue plan of care.  OT Short Term Goals Week 3:  OT Short Term Goal 1 (Week 3): Pt will complete stand pivot transfer to the 3:1 with use of the RW and min assist for 2/3 trials. OT Short Term Goal 2 (Week 3): Pt will complete UB dressing with no more than mod assist for a pullover garment. OT Short Term Goal 3 (Week 3): Pt will complete UB bathing with min assist in unsupported sitting.  Skilled Therapeutic Interventions/Progress Updates:    Pt worked on self feeding to begin session with use of the red foam grips over her utensils as well as use of a plate guard.  She needed mod assist for use of the LUE to scoop food and bring to mouth.  She could complete task using the LUE with supervision.  Next progressed to working on bathing and dressing tasks from the wheelchair per pt choice.  She was able to maintain sitting balance EOC with min assist  while completing washing her face, chest, and abdomen.  Mod assist for balance when attempting to integrate the LUE for washing the right arm or when working on dressing tasks.  She was able to complete UB bathing with min assist but needed max assist for donning a long sleeve pullover shirt.  LB bathing was at min assist level with mod  assist for donning new pants and socks, using one handed technique.  She then completed functional mobility over to the sink with mod assist using the RW and mod demonstrational cueing to stay inside and closer to the front of the walker.  She tends to get too far away.  Min assist for standing while brushing her hair with the RUE.  Increased knee flexion, posterior pelvic tilt, and posterior lean noted while standing.  She also completed toilet transfer at min assist level from the wheelchair with use of the RW with return to the bed after completion.  Min assist for transition to supine where she finished session with work on functional reach using the LUE to targets presented by therapist.  Mod assist to complete shoulder flexion with elbow extension to grasp items in different areas below 90 degrees shoulder flexion.  She was left in the bed with the call button and phone in reach with safety bed alarm in place.   Therapy Documentation Precautions:  Precautions Precautions: Fall, Other (comment) Precaution Comments: L side hemiparesis, left neglect, SBP goal 120-140 range Restrictions Weight Bearing Restrictions: No Pain: Pain Assessment Pain Scale: 0-10 Pain Score: 0-No pain ADL: See Care Tool Section for some details of mobility and selfcare   Therapy/Group: Individual Therapy  Saja Bartolini OTR/L 01/05/2021, 5:04 PM

## 2021-01-06 LAB — GLUCOSE, CAPILLARY
Glucose-Capillary: 134 mg/dL — ABNORMAL HIGH (ref 70–99)
Glucose-Capillary: 126 mg/dL — ABNORMAL HIGH (ref 70–99)
Glucose-Capillary: 166 mg/dL — ABNORMAL HIGH (ref 70–99)
Glucose-Capillary: 113 mg/dL — ABNORMAL HIGH (ref 70–99)

## 2021-01-06 NOTE — Progress Notes (Signed)
Physical Therapy Session Note  Patient Details  Name: Alicia Guerrero MRN: [id number] Date of Birth: May 26, 1935  Today's Date: 01/06/2021 PT Individual Time: 1503-1530 PT Individual Time Calculation (min): 27 min   Short Term Goals: Week 3:  PT Short Term Goal 1 (Week 3): Pt will perform supine<>sit with min assist consistently PT Short Term Goal 2 (Week 3): Pt will perform sit<>stand using LRAD with min assist consistently PT Short Term Goal 3 (Week 3): Pt will perform bed<>chair transfers using LRAD with min assist consistently PT Short Term Goal 4 (Week 3): Pt will ambulate at least 49f using LRAD with min assist consistently (including some turning)  Skilled Therapeutic Interventions/Progress Updates: Pt presented in TIS agreeable to therapy. Pt denies pain during session. Session focused on gait, standing balance, and NMR vis forced use of LUE. Performed Sit to stand with CGA and pt requring cues for hand placement. Pt then ambulated across hallway to therapy gym with minA and pt requiring cues for maintaining close proximity to RW as pt's LLE would intermittently step outside of RW. Pt then participated in standing clothespin activitiy placing LUE on clothespin on R side of counter and sliding over to L. Pt was able to perform x [facility] with [facility] intermittently but with significant postural changes with fatigue. Pt requiring cues to maintain R knee extension and increased R lateral lean (near windswept posture at end of task). Pt also participated in placing LUE on counter and picking up/placing clothespins on rod. After seated rest pt ambulated across hall back to room in same manner as prior and sat at EOB. Pt performed sit to supine on flat bed with CGA and use of bed rail. Pt was able to perform lateral scoot via bridge to reposition and pt left in bed at end of session with bed alarm on, call bell within reach and needs met.      Therapy Documentation Precautions:   Precautions Precautions: Fall, Other (comment) Precaution Comments: L side hemiparesis, left neglect, SBP goal 120-140 range Restrictions Weight Bearing Restrictions: No General:   Vital Signs: Therapy Vitals Temp: 98.3 F (36.8 C) Temp Source: Oral Pulse Rate: 65 Resp: 16 BP: (!) 144/72 Patient Position (if appropriate): Sitting Oxygen Therapy SpO2: 100 % O2 Device: Room Air Pain:   Mobility:   Locomotion :    Trunk/Postural Assessment :    Balance:   Exercises:   Other Treatments:      Therapy/Group: Individual Therapy  Mallarie Voorhies 01/06/2021, 4:13 PM

## 2021-01-06 NOTE — Progress Notes (Signed)
PROGRESS NOTE   Subjective/Complaints:  Pt reports LBM 2 days ago- - is normal for her.  Trying to get dressed with NT to get to bathroom;    IYM:EBRAXEN by cognition   Objective:   No results found. No results for input(s): WBC, HGB, HCT, PLT in the last 72 hours.  No results for input(s): NA, K, CL, CO2, GLUCOSE, BUN, CREATININE, CALCIUM in the last 72 hours.   Intake/Output Summary (Last 24 hours) at 01/06/2021 1232 Last data filed at 01/06/2021 0859 Gross per 24 hour  Intake 840 ml  Output --  Net 840 ml          Physical Exam: Vital Signs Blood pressure 116/70, pulse 68, temperature 98.2 F (36.8 C), temperature source Oral, resp. rate 16, height 5\' 3"  (1.6 m), weight 64.5 kg, SpO2 96 %.    General: awake, alert, appropriate, sitting up in bed; fell backwards slightly- against bedding- stayed upright on R- not L; NT and I in room; needed A to sit upright; NAD HENT: conjugate gaze; oropharynx moist CV: regular rate; no JVD Pulmonary: CTA B/L; no W/R/R- good air movement GI: soft, NT, ND, (+)BS Psychiatric: appropriate Neurological: alert; but Ox1-2- L hemiparesis Skin: No evidence of breakdown, no evidence of rash Neurologic: Oriented to person , Zacarias Pontes, Month and situation (Stroke)  Cranial nerves II through XII intact, motor strength is 5/5 in Right deltoid, bicep, tricep, grip, hip flexor, knee extensors, ankle dorsiflexor and plantar flexor, 2- Left delt bic , tri, 2 grip, 3-4/5 L HF, KE ADF Sensory exam normal sensation to light touch and proprioception in bilateral upper and lower extremities  Musculoskeletal: Full range of motion in all 4 extremities. No joint swelling    Assessment/Plan: 1. Functional deficits which require 3+ hours per day of interdisciplinary therapy in a comprehensive inpatient rehab setting. Physiatrist is providing close team supervision and 24 hour management of  active medical problems listed below. Physiatrist and rehab team continue to assess barriers to discharge/monitor patient progress toward functional and medical goals  Care Tool:  Bathing    Body parts bathed by patient: Chest, Abdomen, Right upper leg, Left upper leg, Face, Left arm, Left lower leg, Right lower leg   Body parts bathed by helper: Right arm Body parts n/a: Front perineal area, Buttocks   Bathing assist Assist Level: Minimal Assistance - Patient > 75%     Upper Body Dressing/Undressing Upper body dressing   What is the patient wearing?: Pull over shirt    Upper body assist Assist Level: Maximal Assistance - Patient 25 - 49%    Lower Body Dressing/Undressing Lower body dressing      What is the patient wearing?: Pants     Lower body assist Assist for lower body dressing: Moderate Assistance - Patient 50 - 74%     Toileting Toileting    Toileting assist Assist for toileting: Moderate Assistance - Patient 50 - 74%     Transfers Chair/bed transfer  Transfers assist  Chair/bed transfer activity did not occur: Safety/medical concerns  Chair/bed transfer assist level: Moderate Assistance - Patient 50 - 74% Chair/bed transfer assistive device: Armrests, Programmer, multimedia  Ambulation assist   Ambulation activity did not occur: Safety/medical concerns  Assist level: Moderate Assistance - Patient 50 - 74% Assistive device: Walker-rolling Max distance: 153ft   Walk 10 feet activity   Assist  Walk 10 feet activity did not occur: Safety/medical concerns  Assist level: Moderate Assistance - Patient - 50 - 74% Assistive device: Walker-rolling   Walk 50 feet activity   Assist Walk 50 feet with 2 turns activity did not occur: Safety/medical concerns  Assist level: Moderate Assistance - Patient - 50 - 74% Assistive device: Walker-rolling    Walk 150 feet activity   Assist Walk 150 feet activity did not occur: Safety/medical  concerns         Walk 10 feet on uneven surface  activity   Assist Walk 10 feet on uneven surfaces activity did not occur: Safety/medical concerns         Wheelchair     Assist Is the patient using a wheelchair?: No             Wheelchair 50 feet with 2 turns activity    Assist            Wheelchair 150 feet activity     Assist          Blood pressure 116/70, pulse 68, temperature 98.2 F (36.8 C), temperature source Oral, resp. rate 16, height 5\' 3"  (1.6 m), weight 64.5 kg, SpO2 96 %.  Medical Problem List and Plan: 1. Functional deficits secondary to posterior circulation stroke             -patient may shower             -ELOS/Goals: 10-14 days S-          Continue CIR- PT, OT and SLP 2.  Antithrombotics: -DVT/anticoagulation:  Pharmaceutical: Other (comment)--Eliquis             -antiplatelet therapy: ASA added  3. Pain Management: Tylenol prn.  4. Mood: LCSW to follow for evaluation and support.              -antipsychotic agents: N/A 5. Neuropsych: This patient is not fully capable of making decisions on capable own behalf. 6. Skin/Wound Care: Routine pressure relief measures 7. Fluids/Electrolytes/Nutrition:  encourage PO  I personally reviewed the patient's labs today.  WNL 8. CAF/ICM s/p ICD: Monitor HR TID. Continue Eliquis and bisoprolol.             --monitor for symptoms with increase in activity.   12/22- HR controlled- con't regimen 9. H/o CVA with L-HP: Was independent without AD PTA. 10. HTN: Monitor BP TID--SBP goal 120-140 range per neur due to MCA/PCA stenosis             --continue bisoprolol and Avapro.  Vitals:   01/05/21 1939 01/06/21 0400  BP: (!) 123/59 116/70  Pulse: 66 68  Resp: 18 16  Temp: 98.9 F (37.2 C) 98.2 F (36.8 C)  SpO2: 95% 96%  12/22- BP doing better- con't regimen 11. Dysphagia: Continue D1, nectar liquids.  .             -12/19 encouraged her to push through diet until advanced 12. T2DM:  Hgb A1C- 6.0. Continue to hold metformin             --monitor BS ac/hs and use SSI for now.  CBG (last 3)  Recent Labs    01/05/21 2108 01/06/21 0538 01/06/21 1159  GLUCAP 176* 134* 126*  12/22- BG's controlled- con't regimen 13. Chronic intermittent insomnia: Will resume Melatonin (used 10 mg prn PTA) 14. Chronic congeseted cough: For 5-6 years and felt to be due to allergies. --added Claritin.    15.  Lethargy/confusion- resolved   labs ok, afebrile, no sedating meds   UCx no growth   LOS: 15 days A FACE TO FACE EVALUATION WAS PERFORMED  Alicia Guerrero 01/06/2021, 12:32 PM

## 2021-01-06 NOTE — Progress Notes (Signed)
Occupational Therapy Session Note  Patient Details  Name: Alicia Guerrero MRN: [id number] Date of Birth: 07-29-1935  Today's Date: 01/06/2021 OT Individual Time: 4097-3532 OT Individual Time Calculation (min): 55 min    Short Term Goals: Week 3:  OT Short Term Goal 1 (Week 3): Pt will complete stand pivot transfer to the 3:1 with use of the RW and min assist for 2/3 trials. OT Short Term Goal 2 (Week 3): Pt will complete UB dressing with no more than mod assist for a pullover garment. OT Short Term Goal 3 (Week 3): Pt will complete UB bathing with min assist in unsupported sitting.  Skilled Therapeutic Interventions/Progress Updates:    Pt in tilt in space wheelchair to begin session with agreement to try and toilet.  She was able to ambulate to the 3:1 over the toilet with min assist using the RW for support.  Mod instructional cueing for squaring herself up to the surface before sitting as well as removing the LUE from the hand splint and reaching back with the LUE.  Mod assist was needed for clothing management with min assist for toilet hygiene in standing.  She was then able to ambulate out to the sink at min assist level for washing her hands, again with mod instructional cueing for to stay closer to the walker.  Had her transfer to the wheelchair and she was transported down to the tub room where she was educated on tub/shower transfers with use of the RW and tub bench.  She was able to complete with mod assist and this therapist recommends use at home as she currently has a small seat and understands that this will not be enough.  She reports that she thinks her spouse has a larger tub bench that she may be able to use.  Once this was complete she worked on standing balance and visual scanning with use of the BITS.  She was able to complete two, 1 min intervals with min assist in standing.  Reaction time on the first was 3 seconds overall with more significant delay on the left side with at  approximately 5 seconds.  On the second interval she was able to reduce reaction time down to 2 seconds overall with around 3 on the left side of the screen.  Accuracy also improved to 69%.  All sets were completed with use of the RUE.  Finished session with transfer back to the room via wheelchair where pt was left sitting up with full lap tray in place and safety belt as well.  She was instructed to continue working on AROM with the LUE using washcloth and washing the lap tray.  Call button and phone in reach.   Therapy Documentation Precautions:  Precautions Precautions: Fall, Other (comment) Precaution Comments: L side hemiparesis, left neglect, SBP goal 120-140 range Restrictions Weight Bearing Restrictions: No   Pain: Pain Assessment Pain Scale: Faces Pain Score: 0-No pain ADL: See Care Tool Section for some details of mobility and selfcare   Therapy/Group: Individual Therapy  Talayia Hjort OTR/L 01/06/2021, 12:03 PM

## 2021-01-06 NOTE — Progress Notes (Signed)
Physical Therapy Session Note  Patient Details  Name: Alicia Guerrero MRN: [id number] Date of Birth: [birth year]  Today's Date: 01/06/2021 PT Individual Time: 2924-4628; 6381-7711 PT Individual Time Calculation (min): 56 min and 40 mins  Short Term Goals:  Week 3:  PT Short Term Goal 1 (Week 3): Pt will perform supine<>sit with min assist consistently PT Short Term Goal 2 (Week 3): Pt will perform sit<>stand using LRAD with min assist consistently PT Short Term Goal 3 (Week 3): Pt will perform bed<>chair transfers using LRAD with min assist consistently PT Short Term Goal 4 (Week 3): Pt will ambulate at least 84ft using LRAD with min assist consistently (including some turning)  Skilled Therapeutic Interventions/Progress Updates:    Session 1: Patient received sitting up in wc, NT present, agreeable to PT. She denies pain. Patient much more alert and oriented this AM that previous sessions. PT providing set up assist and full supervision while patient ate breakfast. Built up silverware and plate guard utilized to increase patients independence. Cues needed to take smaller bites and slow down PO intake. Intermittent R and L pocketing noted with cues needed for tongue sweep. PT transporting patient in wc to therapy gym for time management and energy conservation. She ambulated 52ft with RW and MinA. Cues to remain within walker frame as patient had a tendency to push it too far in front of herself and remain to the L of the walker. Patient ambulating additional ~154ft with RW and MinA to collect targets from hallway railing. Min cues needed to collect all the targets (full visual field scanning) and cues for safety to step closer to the target to retrieve it instead of reaching very far outside her BOS. Patient with poor carryover of safety cues and will need reinforcement prior to dc. Patient returning to room in wc, tilted back, seatbelt alarm on, lap tray in place, call light within reach.    Session 2: Patient received sitting up in wc, agreeable to PT She denies pain. Patient ambulating ~159ft with RW and MinA + verbal cues to remain within walker frame and centered in frame (tendency to be to the L in the frame). Patient retrieving horseshoes from railing with some carryover from AM session regarding safety of getting close to target instead of reaching far outside her BOS. Patient ambulating ~38ft into therapy gym with RW and CGA. Dynamic standing balance on foam surface using R UE to place clothes pins. Appropriate ankle strategy noted. Patient completing same task with NBOS. Increase ankle strategy and patient stepping out of NBOS at one point to assist with maintaining balance. Otherwise, CGA provided to maintain balance. Patient ambulating in hallway final time with RW and CGA/MinA completing dual cog task. Slower gait speed noted and increased forward flexed posture. One instance of patient lifting walker to turn. Patient returning to room, seatbelt alarm on, call light within reach.   Therapy Documentation Precautions:  Precautions Precautions: Fall, Other (comment) Precaution Comments: L side hemiparesis, left neglect, SBP goal 120-140 range Restrictions Weight Bearing Restrictions: No    Therapy/Group: Individual Therapy  Karoline Caldwell, PT, DPT, CBIS  01/06/2021, 7:28 AM

## 2021-01-07 LAB — GLUCOSE, CAPILLARY
Glucose-Capillary: 115 mg/dL — ABNORMAL HIGH (ref 70–99)
Glucose-Capillary: 142 mg/dL — ABNORMAL HIGH (ref 70–99)
Glucose-Capillary: 157 mg/dL — ABNORMAL HIGH (ref 70–99)
Glucose-Capillary: 143 mg/dL — ABNORMAL HIGH (ref 70–99)

## 2021-01-07 NOTE — Progress Notes (Signed)
Physical Therapy Session Note  Patient Details  Name: Alicia Guerrero MRN: [id number] Date of Birth: 03-12-1935  Today's Date: 01/07/2021 PT Individual Time: 5638-9373 PT Individual Time Calculation (min): 71 min   Short Term Goals: Week 3:  PT Short Term Goal 1 (Week 3): Pt will perform supine<>sit with min assist consistently PT Short Term Goal 2 (Week 3): Pt will perform sit<>stand using LRAD with min assist consistently PT Short Term Goal 3 (Week 3): Pt will perform bed<>chair transfers using LRAD with min assist consistently PT Short Term Goal 4 (Week 3): Pt will ambulate at least 33ft using LRAD with min assist consistently (including some turning)  Skilled Therapeutic Interventions/Progress Updates:    Patient received sitting up in wc agreeable to PT. She denies pain. PT transporting patient in wc to therapy gym for time management and energy conservation. She completed standing dynamic balance tasks including a ring toss and corn hole with L UE support on RW + CGA. No LOB and able to reach safely outside BOS. Patient ambulating 65ft with RW and MinA- assist needed to managing walker due to L UE strength. Trial with [facility] due to assist needed for managing walker- able to ambulate a total of 62ft with [facility] and CGA- verbal cues for gait sequence. Patient does require less assist for walking with [place], but notes that she prefers to use RW. She will benefit from further practice with this AD to assure safety prior to dc. Patient completing Merrilee Jansky balance scale scoring a 21/56 indicating increased risk for falling and benefit from use of AD. Discussed findings with patient and she verbalized understanding. Patient requesting to continue "practicing" with RW- ambulating additional 43ft with RW and MinA. [place] Max verbal cues and intermittent tactile cues, patient has very difficult time remaining within walker frame, especially with turns, and stays to the L of the frame. With turns, patient  sometimes stepping L foot outside walker base increasing her risk for tripping on it and falling. PT applying theraband to R and L side of front bar of walker to provide patient with visual cue of targets to remain between. This had a moderate impact on patients posture throughout gait training. She ambulated final 29ft with RW and MinA and only required cues to remain forward in walker frame, and not side to side. Patient returning to room in wc, tilted back, lap tray in place, seatbelt alarm on, call light within reach.   Therapy Documentation Precautions:  Precautions Precautions: Fall, Other (comment) Precaution Comments: L side hemiparesis, left neglect, SBP goal 120-140 range Restrictions Weight Bearing Restrictions: No     Therapy/Group: Individual Therapy  Karoline Caldwell, PT, DPT, CBIS  01/07/2021, 7:27 AM

## 2021-01-07 NOTE — Progress Notes (Signed)
Occupational Therapy Session Note  Patient Details  Name: Alicia Guerrero MRN: [id number] Date of Birth: 07/18/1935  Today's Date: 01/07/2021 OT Individual Time: 4235-3614 OT Individual Time Calculation (min): 77 min    Short Term Goals: Week 3:  OT Short Term Goal 1 (Week 3): Pt will complete stand pivot transfer to the 3:1 with use of the RW and min assist for 2/3 trials. OT Short Term Goal 2 (Week 3): Pt will complete UB dressing with no more than mod assist for a pullover garment. OT Short Term Goal 3 (Week 3): Pt will complete UB bathing with min assist in unsupported sitting.  Skilled Therapeutic Interventions/Progress Updates:    Pt completed bathing and dressing sit to stand from the tilt in space wheelchair.  Mod assist for removal of pullover shirt as well as pants and brief sit to stand.  She was able to complete UB bathing in sitting with min assist and then LB bathing with mod assist sit to stand.  Max assist was needed for all dressing following hemi techniques.  Mod hand over hand assist was given for squeezing out the washcloth, washing the RUE, and assisting with pulling up shirt sleeve with the RUE.  She continues to need mod instructional cueing to begin with dressing the LUE and LLE first.  She also continues to need mod instructional cueing to push up from the surface with the RUE with sit to stand instead of pulling up on the walker.  Next, she was able to ambulate over to the sink with min assist using the RW in order to rinse out her dentures and comb hair.  She transferred  back to the wheelchair at min assist level where she was left working on AROM exercises with the LUE, washing the full lap tray.  Call button and phone in reach with safety alarm belt in place.      Therapy Documentation Precautions:  Precautions Precautions: Fall, Other (comment) Precaution Comments: L side hemiparesis, left neglect, SBP goal 120-140 range Restrictions Weight Bearing Restrictions:  No  Pain: Pain Assessment Pain Scale: Faces Pain Score: 0-No pain ADL: See Care Tool Section for some details of mobility and selfcare   Therapy/Group: Individual Therapy  Lainee Lehrman OTR/L 01/07/2021, 12:17 PM

## 2021-01-07 NOTE — Progress Notes (Signed)
Speech Language Pathology Daily Session Note  Patient Details  Name: Alicia Guerrero MRN: [id number] Date of Birth: December 24, 1935  Today's Date: 01/07/2021 SLP Individual Time: 1300-1400 SLP Individual Time Calculation (min): 60 min  Short Term Goals: Week 3: SLP Short Term Goal 1 (Week 3): Patient will use external aids to orient to person/place/time/situation with sup A verbal cues SLP Short Term Goal 2 (Week 3): Patient will demonstrate selective attention by participating in functional tasks within a mildly distracting environment with only min A cues to redirect SLP Short Term Goal 3 (Week 3): Patient will mildly complex problem solving with sup A verbal cues SLP Short Term Goal 4 (Week 3): Patient will consume current diet without overt s/sx of aspiration and supervision assist for implementation of safe swallowing strategies  Skilled Therapeutic Interventions: Skilled ST treatment focused on cognitive and swallowing goals. Patient was oriented to person/place/situation independent of cues, and required sup A verbal cues to orient to time using external aid across from bed (calendar with date/mo/yr). SLP facilitated Scattergories Categories task by generating words within categories based on initial letter with sup A verbal cues for verbal reasoning and sup A verbal redirection for selective attention. Pt with less distractibility to external distractions and was able to participate in cognitive tasks with low TV volume in background. Patient completed oral care with set-up A prior to PO trials. Pt consumed thin liquids (water) cup sips x7 trials with delayed cough response x3 and immediate, wet cough response x1. Patient displayed what appeared to be slightly delayed AP transit with lingual pumping at times with thin liquids, and suspected delayed pharyngeal swallow response. Pt reported tolerance of Dys 2 diet since recent diet advancement, however reports she had some coughing with cooked greens  recently. SLP reinforced safe swallow precautions and strategies with sup-to-min A verbal cues for recall of precautions. Continue current diet and thin liquid (water only) trials with SLP only. Patient was left in bed in upright positioning with alarm activated and immediate needs within reach at end of session. Continue per current plan of care.      Pain Pain Assessment Pain Scale: 0-10 Pain Score: 0-No pain  Therapy/Group: Individual Therapy  Patty Sermons 01/07/2021, 1:59 PM

## 2021-01-07 NOTE — Progress Notes (Signed)
PROGRESS NOTE   Subjective/Complaints:  Pt  reports "hibernating"- slept well- LBM yesterday- no issues.    TIW:PYKDXIP by cognition   Objective:   No results found. No results for input(s): WBC, HGB, HCT, PLT in the last 72 hours.  No results for input(s): NA, K, CL, CO2, GLUCOSE, BUN, CREATININE, CALCIUM in the last 72 hours.   Intake/Output Summary (Last 24 hours) at 01/07/2021 0911 Last data filed at 01/06/2021 1829 Gross per 24 hour  Intake 600 ml  Output --  Net 600 ml          Physical Exam: Vital Signs Blood pressure (!) 155/88, pulse 69, temperature 98.1 F (36.7 C), temperature source Oral, resp. rate 16, height 5\' 3"  (1.6 m), weight 64.5 kg, SpO2 97 %.     General: awake, alert, appropriate, sitting u in chair- about to eat breakfast; NAD HENT: conjugate gaze; oropharynx moist CV: regular rate; no JVD Pulmonary: CTA B/L; no W/R/R- good air movement GI: soft, NT, ND, (+)BS Psychiatric: appropriate Neurological:  but Ox1-2- L hemiparesis; MAS of 1+ in LUE Skin: No evidence of breakdown, no evidence of rash Neurologic: Oriented to person , [facility], Month and situation (Stroke)  Cranial nerves II through XII intact, motor strength is 5/5 in Right deltoid, bicep, tricep, grip, hip flexor, knee extensors, ankle dorsiflexor and plantar flexor, 2- Left delt bic , tri, 2 grip, 3-4/5 L HF, KE ADF Sensory exam normal sensation to light touch and proprioception in bilateral upper and lower extremities  Musculoskeletal: Full range of motion in all 4 extremities. No joint swelling    Assessment/Plan: 1. Functional deficits which require 3+ hours per day of interdisciplinary therapy in a comprehensive inpatient rehab setting. Physiatrist is providing close team supervision and 24 hour management of active medical problems listed below. Physiatrist and rehab team continue to assess barriers to  discharge/monitor patient progress toward functional and medical goals  Care Tool:  Bathing    Body parts bathed by patient: Chest, Abdomen, Right upper leg, Left upper leg, Face, Left arm, Left lower leg, Right lower leg   Body parts bathed by helper: Right arm Body parts n/a: Front perineal area, Buttocks   Bathing assist Assist Level: Minimal Assistance - Patient > 75%     Upper Body Dressing/Undressing Upper body dressing   What is the patient wearing?: Pull over shirt    Upper body assist Assist Level: Maximal Assistance - Patient 25 - 49%    Lower Body Dressing/Undressing Lower body dressing      What is the patient wearing?: Pants     Lower body assist Assist for lower body dressing: Moderate Assistance - Patient 50 - 74%     Toileting Toileting    Toileting assist Assist for toileting: Moderate Assistance - Patient 50 - 74%     Transfers Chair/bed transfer  Transfers assist  Chair/bed transfer activity did not occur: Safety/medical concerns  Chair/bed transfer assist level: Moderate Assistance - Patient 50 - 74% Chair/bed transfer assistive device: Armrests, Walker   Locomotion Ambulation   Ambulation assist   Ambulation activity did not occur: Safety/medical concerns  Assist level: Moderate Assistance - Patient 50 -  74% Assistive device: Walker-rolling Max distance: 150ft   Walk 10 feet activity   Assist  Walk 10 feet activity did not occur: Safety/medical concerns  Assist level: Moderate Assistance - Patient - 50 - 74% Assistive device: Walker-rolling   Walk 50 feet activity   Assist Walk 50 feet with 2 turns activity did not occur: Safety/medical concerns  Assist level: Moderate Assistance - Patient - 50 - 74% Assistive device: Walker-rolling    Walk 150 feet activity   Assist Walk 150 feet activity did not occur: Safety/medical concerns         Walk 10 feet on uneven surface  activity   Assist Walk 10 feet on uneven  surfaces activity did not occur: Safety/medical concerns         Wheelchair     Assist Is the patient using a wheelchair?: No             Wheelchair 50 feet with 2 turns activity    Assist            Wheelchair 150 feet activity     Assist          Blood pressure (!) 155/88, pulse 69, temperature 98.1 F (36.7 C), temperature source Oral, resp. rate 16, height 5\' 3"  (1.6 m), weight 64.5 kg, SpO2 97 %.  Medical Problem List and Plan: 1. Functional deficits secondary to posterior circulation stroke             -patient may shower             -ELOS/Goals: 10-14 days S-         Continue CIR- PT, OT and SLP 2.  Antithrombotics: -DVT/anticoagulation:  Pharmaceutical: Other (comment)--Eliquis             -antiplatelet therapy: ASA added  3. Pain Management: Tylenol prn.  4. Mood: LCSW to follow for evaluation and support.              -antipsychotic agents: N/A 5. Neuropsych: This patient is not fully capable of making decisions on capable own behalf. 6. Skin/Wound Care: Routine pressure relief measures 7. Fluids/Electrolytes/Nutrition:  encourage PO  I personally reviewed the patient's labs today.  WNL 8. CAF/ICM s/p ICD: Monitor HR TID. Continue Eliquis and bisoprolol.             --monitor for symptoms with increase in activity.   12/22- HR controlled- con't regimen 9. H/o CVA with L-HP: Was independent without AD PTA. 10. HTN: Monitor BP TID--SBP goal 120-140 range per neur due to MCA/PCA stenosis             --continue bisoprolol and Avapro.  Vitals:   01/07/21 0326 01/07/21 0555  BP: (!) 154/79 (!) 155/88  Pulse: 70 69  Resp: 16 16  Temp: 98.3 F (36.8 C) 98.1 F (36.7 C)  SpO2: 97% 97%   12/23- BP overall controlled- con't regimen 11. Dysphagia: Continue D1, nectar liquids.  .             -12/19 encouraged her to push through diet until advanced 12. T2DM: Hgb A1C- 6.0. Continue to hold metformin             --monitor BS ac/hs and use SSI  for now.  CBG (last 3)  Recent Labs    01/06/21 1812 01/06/21 2110 01/07/21 0605  GLUCAP 166* 113* 115*   12/23- BG's controlled 13. Chronic intermittent insomnia: Will resume Melatonin (used 10 mg prn PTA) 14. Chronic congeseted  cough: For 5-6 years and felt to be due to allergies. --added Claritin.    15.  Lethargy/confusion- resolved   labs ok, afebrile, no sedating meds   UCx no growth   LOS: 16 days A FACE TO FACE EVALUATION WAS PERFORMED  Alicia Guerrero 01/07/2021, 9:11 AM

## 2021-01-08 LAB — GLUCOSE, CAPILLARY
Glucose-Capillary: 117 mg/dL — ABNORMAL HIGH (ref 70–99)
Glucose-Capillary: 151 mg/dL — ABNORMAL HIGH (ref 70–99)
Glucose-Capillary: 180 mg/dL — ABNORMAL HIGH (ref 70–99)
Glucose-Capillary: 111 mg/dL — ABNORMAL HIGH (ref 70–99)

## 2021-01-08 NOTE — Progress Notes (Signed)
Occupational Therapy Session Note  Patient Details  Name: Alicia Guerrero MRN: [id number] Date of Birth: March 21, 1935  Today's Date: 01/08/2021 OT Individual Time: 4656-8127 OT Individual Time Calculation (min): 61 min    Short Term Goals: Week 3:  OT Short Term Goal 1 (Week 3): Pt will complete stand pivot transfer to the 3:1 with use of the RW and min assist for 2/3 trials. OT Short Term Goal 2 (Week 3): Pt will complete UB dressing with no more than mod assist for a pullover garment. OT Short Term Goal 3 (Week 3): Pt will complete UB bathing with min assist in unsupported sitting.  Skilled Therapeutic Interventions/Progress Updates:    Pt in bed to start with completion of transfer to sitting EOB with min assist.  She then worked on donning her slip on shoes with mod assist.  Increased posterior pelvic tilt and increased lean noted while attempting to donn them.  She then completed stand pivot transfer to the wheelchair at min assist level and was taken down to the therapy gym, where she transferred to the therapy mat at the same level.  Worked on LUE neuromuscular re-education with use of the tilted stool, while focusing on maintaining upright posture with anterior pelvic tilt.  Mod demonstrational cueing to avoid posterior lean while pushing tilted stool.  She then progressed to sliding hand up a slanted board with washcloth placed under the hand.  Board placed at around 60 degree incline with min facilitation needed to achieve full elbow extension.  Completed 3 intervals of 10 repetitions.  Transitioned to functional reach next with pt reaching to pick up clothes pins placed upright on the stool in front of her and then bringing them back to place in the container beside of her.  She was able to complete several repetitions with mod facilitation at the shoulder.  Decreased digit flexion noted as well as thumb extension.  She also continues to report increased pain in the thumb [facility] joint with  attempted weightbearing on the mat.  Finished session with transfer back to the wheelchair at min assist level and return to the room.  She was left sitting up with the full lap tray in place and with safely belt as well.  Call button and phone placed on her right side.    Therapy Documentation Precautions:  Precautions Precautions: Fall, Other (comment) Precaution Comments: L side hemiparesis, left neglect, SBP goal 120-140 range Restrictions Weight Bearing Restrictions: No  Pain: Pain Assessment Pain Scale: Faces Pain Score: 0-No pain Faces Pain Scale: Hurts a little bit Pain Type: Chronic pain Pain Location: Hand Pain Orientation: Left Pain Descriptors / Indicators: Discomfort Pain Onset: With Activity Pain Intervention(s): Repositioned ADL: See Care Tool Section for some details of mobility and selfcare   Therapy/Group: Individual Therapy  Deniyah Dillavou OTR/L 01/08/2021, 12:31 PM

## 2021-01-08 NOTE — Progress Notes (Signed)
Speech Language Pathology Daily Session Note  Patient Details  Name: Alicia Guerrero MRN: [id number] Date of Birth: April 11, 1935  Today's Date: 01/08/2021 SLP Individual Time: 1210-1255 SLP Individual Time Calculation (min): 45 min  Short Term Goals: Week 3: SLP Short Term Goal 1 (Week 3): Patient will use external aids to orient to person/place/time/situation with sup A verbal cues SLP Short Term Goal 2 (Week 3): Patient will demonstrate selective attention by participating in functional tasks within a mildly distracting environment with only min A cues to redirect SLP Short Term Goal 3 (Week 3): Patient will mildly complex problem solving with sup A verbal cues SLP Short Term Goal 4 (Week 3): Patient will consume current diet without overt s/sx of aspiration and supervision assist for implementation of safe swallowing strategies  Skilled Therapeutic Interventions: Skilled treatment session focused on dysphagia and cognitive goals. SLP facilitated session by providing skilled observation with lunch meal of Dys. 2 textures with nectar-thick liquids. Patient consumed diet with Min verbal cues needed for use of small bites and a slow rate of self-feeding resulting in overt coughing X 2. Patient with minimal oral residue after that meal due to alternating between liquids and solids throughout the meal. Recommend patient continue current diet. Prior to initiation of self-feeding, patient independently requested the red built up grip be placed on her spoon. Patient demonstrated selective attention to self-feeding in a mildly distracting environment for 25 minutes with Mod I. Patient requested to call her son and independently recalled where the numbers were located and dialed his number independently. Patient transferred back to bed at end of session per her request via the stedy. Patient left upright in bed with alarm on and all needs within reach. Continue with current plan of care.      Pain Pain  Assessment Pain Scale: Faces Faces Pain Scale: Hurts a little bit Pain Type: Chronic pain Pain Location: Hand Pain Orientation: Left Pain Descriptors / Indicators: Discomfort Pain Onset: With Activity Pain Intervention(s): Repositioned  Therapy/Group: Individual Therapy  Amalee Olsen 01/08/2021, 1:29 PM

## 2021-01-08 NOTE — Progress Notes (Signed)
PROGRESS NOTE   Subjective/Complaints:  Slept well. No new complaints. Left hand still feels "stiff", but working on stretches in bed.   ROS: Patient denies fever, rash, sore throat, blurred vision, nausea, vomiting, diarrhea, cough, shortness of breath or chest pain, joint or back pain, headache, or mood change.    Objective:   No results found. No results for input(s): WBC, HGB, HCT, PLT in the last 72 hours.  No results for input(s): NA, K, CL, CO2, GLUCOSE, BUN, CREATININE, CALCIUM in the last 72 hours.   Intake/Output Summary (Last 24 hours) at 01/08/2021 1316 Last data filed at 01/08/2021 0855 Gross per 24 hour  Intake 480 ml  Output --  Net 480 ml          Physical Exam: Vital Signs Blood pressure (!) 136/96, pulse 73, temperature 98.1 F (36.7 C), resp. rate 16, height 5\' 3"  (1.6 m), weight 64.5 kg, SpO2 96 %.  Constitutional: No distress . Vital signs reviewed. HEENT: NCAT, EOMI, oral membranes moist Neck: supple Cardiovascular: RRR without murmur. No JVD    Respiratory/Chest: CTA Bilaterally without wheezes or rales. Normal effort    GI/Abdomen: BS +, non-tender, non-distended Ext: no clubbing, cyanosis, or edema Psych: pleasant and cooperative  Skin: No evidence of breakdown, no evidence of rash Neurologic: Oriented to person , hospital, month, stroke.  Cranial nerves II through XII intact, motor strength is 5/5 in Right deltoid, bicep, tricep, grip, hip flexor, knee extensors, ankle dorsiflexor and plantar flexor, 2- Left delt bic , tri, 2 grip, 3-4/5 L HF, KE ADF, Tone 1/4 left wrist finger flexors Sensory exam normal sensation to light touch and proprioception in bilateral upper and lower extremities  Musculoskeletal: Full range of motion in all 4 extremities. No joint swelling    Assessment/Plan: 1. Functional deficits which require 3+ hours per day of interdisciplinary therapy in a  comprehensive inpatient rehab setting. Physiatrist is providing close team supervision and 24 hour management of active medical problems listed below. Physiatrist and rehab team continue to assess barriers to discharge/monitor patient progress toward functional and medical goals  Care Tool:  Bathing    Body parts bathed by patient: Chest, Abdomen, Right upper leg, Left upper leg, Face, Left arm, Left lower leg, Right lower leg   Body parts bathed by helper: Right arm Body parts n/a: Front perineal area, Buttocks   Bathing assist Assist Level: Minimal Assistance - Patient > 75%     Upper Body Dressing/Undressing Upper body dressing   What is the patient wearing?: Pull over shirt    Upper body assist Assist Level: Maximal Assistance - Patient 25 - 49%    Lower Body Dressing/Undressing Lower body dressing      What is the patient wearing?: Pants     Lower body assist Assist for lower body dressing: Moderate Assistance - Patient 50 - 74%     Toileting Toileting    Toileting assist Assist for toileting: Moderate Assistance - Patient 50 - 74%     Transfers Chair/bed transfer  Transfers assist  Chair/bed transfer activity did not occur: Safety/medical concerns  Chair/bed transfer assist level: Moderate Assistance - Patient 50 - 74% Chair/bed transfer  assistive device: Armrests, Programmer, multimedia   Ambulation assist   Ambulation activity did not occur: Safety/medical concerns  Assist level: Moderate Assistance - Patient 50 - 74% Assistive device: Walker-rolling Max distance: 135ft   Walk 10 feet activity   Assist  Walk 10 feet activity did not occur: Safety/medical concerns  Assist level: Moderate Assistance - Patient - 50 - 74% Assistive device: Walker-rolling   Walk 50 feet activity   Assist Walk 50 feet with 2 turns activity did not occur: Safety/medical concerns  Assist level: Moderate Assistance - Patient - 50 - 74% Assistive device:  Walker-rolling    Walk 150 feet activity   Assist Walk 150 feet activity did not occur: Safety/medical concerns         Walk 10 feet on uneven surface  activity   Assist Walk 10 feet on uneven surfaces activity did not occur: Safety/medical concerns         Wheelchair     Assist Is the patient using a wheelchair?: No             Wheelchair 50 feet with 2 turns activity    Assist            Wheelchair 150 feet activity     Assist          Blood pressure (!) 136/96, pulse 73, temperature 98.1 F (36.7 C), resp. rate 16, height 5\' 3"  (1.6 m), weight 64.5 kg, SpO2 96 %.  Medical Problem List and Plan: 1. Functional deficits secondary to posterior circulation stroke             -patient may shower             -ELOS/Goals: 10-14 days S-         -Continue CIR therapies including PT, OT, and SLP  2.  Antithrombotics: -DVT/anticoagulation:  Pharmaceutical: Other (comment)--Eliquis             -antiplatelet therapy: ASA added  3. Pain Management: Tylenol prn.  4. Mood: LCSW to follow for evaluation and support.              -antipsychotic agents: N/A 5. Neuropsych: This patient is not fully capable of making decisions on capable own behalf. 6. Skin/Wound Care: Routine pressure relief measures 7. Fluids/Electrolytes/Nutrition:  encourage PO  I personally reviewed the patient's labs today.  WNL 8. CAF/ICM s/p ICD: Monitor HR TID. Continue Eliquis and bisoprolol.             --monitor for symptoms with increase in activity.   12/24- HR controlled- con't regimen 9. H/o CVA with L-HP: Was independent without AD PTA. 10. HTN: Monitor BP TID--SBP goal 120-140 range per neur due to MCA/PCA stenosis             --continue bisoprolol and Avapro.  Vitals:   01/07/21 1926 01/08/21 0624  BP: 124/68 (!) 136/96  Pulse: 66 73  Resp: 16 16  Temp: 98 F (36.7 C) 98.1 F (36.7 C)  SpO2: 96% 96%   12/24- BP overall controlled-borderline dbp today---follow  for now 11. Dysphagia: Continue D1, nectar liquids.  .             -12/19 encouraged her to push through diet until advanced 12. T2DM: Hgb A1C- 6.0. Continue to hold metformin             --monitor BS ac/hs and use SSI for now.  CBG (last 3)  Recent Labs  01/07/21 2059 01/08/21 0632 01/08/21 1142  GLUCAP 143* 117* 151*   12/24- cbg's reasonably controlled 13. Chronic intermittent insomnia: Will resume Melatonin (used 10 mg prn PTA) 14. Chronic congeseted cough: For 5-6 years and felt to be due to allergies. --added Claritin.    15.  Lethargy/confusion- resolved   labs ok, afebrile, no sedating meds   UCx no growth   LOS: 17 days A FACE TO FACE EVALUATION WAS PERFORMED  Meredith Staggers 01/08/2021, 1:16 PM

## 2021-01-09 LAB — GLUCOSE, CAPILLARY
Glucose-Capillary: 113 mg/dL — ABNORMAL HIGH (ref 70–99)
Glucose-Capillary: 146 mg/dL — ABNORMAL HIGH (ref 70–99)
Glucose-Capillary: 155 mg/dL — ABNORMAL HIGH (ref 70–99)
Glucose-Capillary: 169 mg/dL — ABNORMAL HIGH (ref 70–99)

## 2021-01-10 LAB — GLUCOSE, CAPILLARY
Glucose-Capillary: 138 mg/dL — ABNORMAL HIGH (ref 70–99)
Glucose-Capillary: 132 mg/dL — ABNORMAL HIGH (ref 70–99)
Glucose-Capillary: 166 mg/dL — ABNORMAL HIGH (ref 70–99)
Glucose-Capillary: 206 mg/dL — ABNORMAL HIGH (ref 70–99)

## 2021-01-10 LAB — BASIC METABOLIC PANEL
Anion gap: 9 (ref 5–15)
BUN: 20 mg/dL (ref 8–23)
CO2: 24 mmol/L (ref 22–32)
Calcium: 9.2 mg/dL (ref 8.9–10.3)
Chloride: 104 mmol/L (ref 98–111)
Creatinine, Ser: 0.8 mg/dL (ref 0.44–1.00)
GFR, Estimated: >60 mL/min (ref >60)
Glucose, Bld: 119 mg/dL — ABNORMAL HIGH (ref 70–99)
Potassium: 4.2 mmol/L (ref 3.5–5.1)
Sodium: 137 mmol/L (ref 135–145)

## 2021-01-10 LAB — CBC
HCT: 40.8 % (ref 36.0–46.0)
Hemoglobin: 13 g/dL (ref 12.0–15.0)
MCH: 26.9 pg (ref 26.0–34.0)
MCHC: 31.9 g/dL (ref 30.0–36.0)
MCV: 84.5 fL (ref 80.0–100.0)
Platelets: 328 10*3/uL (ref 150–400)
RBC: 4.83 MIL/uL (ref 3.87–5.11)
RDW: 13.2 % (ref 11.5–15.5)
WBC: 10 10*3/uL (ref 4.0–10.5)
nRBC: 0 % (ref 0.0–0.2)

## 2021-01-10 NOTE — Progress Notes (Signed)
PROGRESS NOTE   Subjective/Complaints:  Pt reports cannot find eyeglasses- I helped her find them- Also getting lab draw this AM.  LBM yesterday- denies pain.    ROS:  Pt denies SOB, abd pain, CP, N/V/C/D, and vision changes    Objective:   No results found. Recent Labs    01/10/21 0737  WBC 10.0  HGB 13.0  HCT 40.8  PLT 328    Recent Labs    01/10/21 0737  NA 137  K 4.2  CL 104  CO2 24  GLUCOSE 119*  BUN 20  CREATININE 0.80  CALCIUM 9.2     Intake/Output Summary (Last 24 hours) at 01/10/2021 0911 Last data filed at 01/09/2021 1238 Gross per 24 hour  Intake 118 ml  Output --  Net 118 ml          Physical Exam: Vital Signs Blood pressure 137/77, pulse 65, temperature 97.7 F (36.5 C), temperature source Oral, resp. rate 18, height 5\' 3"  (1.6 m), weight 64.5 kg, SpO2 95 %.   General: awake, alert, appropriate, sitting up in bed; getting lab draw by phlebotomy; NAD HENT: conjugate gaze; oropharynx moist CV: regular rate; no JVD Pulmonary: CTA B/L; no W/R/R- good air movement GI: soft, NT, ND, (+)BS Psychiatric: appropriate; calm Neurological: alert- more cognitively aware Ext: no clubbing, cyanosis, or edema Psych: pleasant and cooperative  Skin: No evidence of breakdown, no evidence of rash Neurologic: Oriented to person , hospital, month, stroke.  Cranial nerves II through XII intact, motor strength is 5/5 in Right deltoid, bicep, tricep, grip, hip flexor, knee extensors, ankle dorsiflexor and plantar flexor, 2- Left delt bic , tri, 2 grip, 3-4/5 L HF, KE ADF, Tone 1/4 left wrist finger flexors Sensory exam normal sensation to light touch and proprioception in bilateral upper and lower extremities  Musculoskeletal: Full range of motion in all 4 extremities. No joint swelling    Assessment/Plan: 1. Functional deficits which require 3+ hours per day of interdisciplinary therapy in a  comprehensive inpatient rehab setting. Physiatrist is providing close team supervision and 24 hour management of active medical problems listed below. Physiatrist and rehab team continue to assess barriers to discharge/monitor patient progress toward functional and medical goals  Care Tool:  Bathing    Body parts bathed by patient: Chest, Abdomen, Right upper leg, Left upper leg, Face, Left arm, Left lower leg, Right lower leg   Body parts bathed by helper: Right arm Body parts n/a: Front perineal area, Buttocks   Bathing assist Assist Level: Minimal Assistance - Patient > 75%     Upper Body Dressing/Undressing Upper body dressing   What is the patient wearing?: Pull over shirt    Upper body assist Assist Level: Maximal Assistance - Patient 25 - 49%    Lower Body Dressing/Undressing Lower body dressing      What is the patient wearing?: Pants     Lower body assist Assist for lower body dressing: Moderate Assistance - Patient 50 - 74%     Toileting Toileting    Toileting assist Assist for toileting: Moderate Assistance - Patient 50 - 74%     Transfers Chair/bed transfer  Transfers assist  Chair/bed transfer activity did not occur: Safety/medical concerns  Chair/bed transfer assist level: Moderate Assistance - Patient 50 - 74% Chair/bed transfer assistive device: Armrests, Walker   Locomotion Ambulation   Ambulation assist   Ambulation activity did not occur: Safety/medical concerns  Assist level: Moderate Assistance - Patient 50 - 74% Assistive device: Walker-rolling Max distance: 194ft   Walk 10 feet activity   Assist  Walk 10 feet activity did not occur: Safety/medical concerns  Assist level: Moderate Assistance - Patient - 50 - 74% Assistive device: Walker-rolling   Walk 50 feet activity   Assist Walk 50 feet with 2 turns activity did not occur: Safety/medical concerns  Assist level: Moderate Assistance - Patient - 50 - 74% Assistive device:  Walker-rolling    Walk 150 feet activity   Assist Walk 150 feet activity did not occur: Safety/medical concerns         Walk 10 feet on uneven surface  activity   Assist Walk 10 feet on uneven surfaces activity did not occur: Safety/medical concerns         Wheelchair     Assist Is the patient using a wheelchair?: No             Wheelchair 50 feet with 2 turns activity    Assist            Wheelchair 150 feet activity     Assist          Blood pressure 137/77, pulse 65, temperature 97.7 F (36.5 C), temperature source Oral, resp. rate 18, height 5\' 3"  (1.6 m), weight 64.5 kg, SpO2 95 %.  Medical Problem List and Plan: 1. Functional deficits secondary to posterior circulation stroke             -patient may shower             -ELOS/Goals: 10-14 days S-         -Continue CIR- PT, OT and SLP 2.  Antithrombotics: -DVT/anticoagulation:  Pharmaceutical: Other (comment)--Eliquis             -antiplatelet therapy: ASA added  3. Pain Management: Tylenol prn.  4. Mood: LCSW to follow for evaluation and support.              -antipsychotic agents: N/A 5. Neuropsych: This patient is not fully capable of making decisions on capable own behalf. 6. Skin/Wound Care: Routine pressure relief measures 7. Fluids/Electrolytes/Nutrition:  encourage PO  12/26- labs look great- except Glucose of 119- everything is normal range- I personally assessed 8. CAF/ICM s/p ICD: Monitor HR TID. Continue Eliquis and bisoprolol.             --monitor for symptoms with increase in activity.   12/24- HR controlled- con't regimen 9. H/o CVA with L-HP: Was independent without AD PTA. 10. HTN: Monitor BP TID--SBP goal 120-140 range per neur due to MCA/PCA stenosis             --continue bisoprolol and Avapro.  Vitals:   01/09/21 2003 01/10/21 0413  BP: 118/72 137/77  Pulse: 67 65  Resp: 16 18  Temp: 98.2 F (36.8 C) 97.7 F (36.5 C)  SpO2: 96% 95%   12/24- BP overall  controlled-borderline dbp today---follow for now  12/26- BP controlled- in 130s/70s- con't regimen 11. Dysphagia: Continue D1, nectar liquids.  .             -12/19 encouraged her to push through diet until advanced 12. T2DM: Hgb A1C- 6.0.  Continue to hold metformin             --monitor BS ac/hs and use SSI for now.  CBG (last 3)  Recent Labs    01/09/21 1624 01/09/21 2140 01/10/21 0612  GLUCAP 155* 169* 138*   12/26- CBGs controlled overall- con't regimen 13. Chronic intermittent insomnia: Will resume Melatonin (used 10 mg prn PTA) 14. Chronic congeseted cough: For 5-6 years and felt to be due to allergies. --added Claritin.    15.  Lethargy/confusion- resolved   labs ok, afebrile, no sedating meds   UCx no growth   LOS: 19 days A FACE TO FACE EVALUATION WAS PERFORMED  Shani Fitch 01/10/2021, 9:11 AM

## 2021-01-10 NOTE — Progress Notes (Signed)
Physical Therapy Session Note  Patient Details  Name: Alicia Guerrero MRN: [id number] Date of Birth: 1936/01/16  Today's Date: 01/10/2021 PT Individual Time: 0807-0907 PT Individual Time Calculation (min): 60 min   Short Term Goals: Week 3:  PT Short Term Goal 1 (Week 3): Pt will perform supine<>sit with min assist consistently PT Short Term Goal 2 (Week 3): Pt will perform sit<>stand using LRAD with min assist consistently PT Short Term Goal 3 (Week 3): Pt will perform bed<>chair transfers using LRAD with min assist consistently PT Short Term Goal 4 (Week 3): Pt will ambulate at least 71ft using LRAD with min assist consistently (including some turning)  Skilled Therapeutic Interventions/Progress Updates:    Pt received transitioning to EOB with NT present to set-up breakfast. Pt agreeable to therapy session therefore therapist assumed care of patient. Sitting EOB for ~4minutes working on dynamic sitting balance, L UE NMR, and dual-task with alternating and divided attention to consume breakfast - pt repeatedly demonstrates R posterior lean/LOB requiring frequent min/mod assist for balance recovery progressed to consistent max/total assist with therapist behind patient for support to maintain upright due to fatigue. Every time pt would initiating an UE task her focus could not be divided to maintain sitting balance resulting in the posterior lean. With red tubing on spoon and mod/max hand-over-hand assistance pt able to eat meal using L UE - pt able to sustain grasp on spoon but unable to lift arm against gravity to get it up to the plate and then to her mouth requiring assistance to lift the arm - limited ability to rotate the spoon to orient it upright to place it in her mouth without spilling the food requiring hand-over-hand assistance. Sit>stand EOB>RW 2x with mod assist for lifting to stand and balance due to continued R posterior lean/LOB. L stand pivot EOB>TIS w/c using RW with mod assist  due to continued R posterior lean/LOB - cuing for stepping backwards fully to the chair.   Sitting in w/c for sitting balance support donned pants, socks, and shoes total assist for time management.Sit>stand from w/c with min assist at this time when pt able to push up from armrest with R hand. Standing with mod assist pulled pants up over hips max assist. Pt left seated in TIS w/c with needs in reach, seat belt alarm on, and L UE supported on pillows.   Therapy Documentation Precautions:  Precautions Precautions: Fall, Other (comment) Precaution Comments: L side hemiparesis, left neglect, SBP goal 120-140 range Restrictions Weight Bearing Restrictions: No   Pain:  No reports of pain throughout session.    Therapy/Group: Individual Therapy  Tawana Scale , PT, DPT, NCS, CSRS  01/10/2021, 7:41 AM

## 2021-01-10 NOTE — Progress Notes (Signed)
Occupational Therapy Session Note  Patient Details  Name: Alicia Guerrero MRN: [id number] Date of Birth: 03/25/1935  Today's Date: 01/10/2021 OT Individual Time: 0712-1975 OT Individual Time Calculation (min): 44 min    Short Term Goals: Week 3:  OT Short Term Goal 1 (Week 3): Pt will complete stand pivot transfer to the 3:1 with use of the RW and min assist for 2/3 trials. OT Short Term Goal 2 (Week 3): Pt will complete UB dressing with no more than mod assist for a pullover garment. OT Short Term Goal 3 (Week 3): Pt will complete UB bathing with min assist in unsupported sitting.  Skilled Therapeutic Interventions/Progress Updates:  Pt greeted seated in w/c  agreeable to OT intervention. Session focus on functional mobility, LUE NMR, dynamic standing balance, and decreasing overall burden of care. Pt transported to [place] with total A for time mgmt to work on functional stand pivot transfers with Rw, pt completed a total of 3 stand pivot transfers to L side and 3 to R side with overall CGA, MIN verbal cues needed for hand placement.  Additionally worked on dynamic standing balance, LUE NMR, and bilateral integration. Pt instructed to stand at window in [facility] gym with LUE on windowsill for NMR while instructed to reach out of BOS with RUE to place ADL items on top ledge of window on L side to pormote improved dynamic standing balance, LUE NMR and crossing midline. Pt needed MIN A to complete task d/t impaired balance when leaning to L side. Pt also worked on bilateral integration when pt instructed to pass ADL items from R hand to L while standing with overall CGA. Pt additionally able to complete ~ 10 ft of functional moblity with RW and MIN A. pt left seated in w/c with chair alarm activated and all needs within reach.                       Therapy Documentation Precautions:  Precautions Precautions: Fall, Other (comment) Precaution Comments: L side hemiparesis, left neglect, SBP goal 120-140  range Restrictions Weight Bearing Restrictions: No  Pain: no pain reported during session      Therapy/Group: Individual Therapy  Precious Haws 01/10/2021, 1:45 PM

## 2021-01-10 NOTE — Progress Notes (Signed)
Speech Language Pathology Daily Session Note  Patient Details  Name: Alicia Guerrero MRN: [id number] Date of Birth: 1935-12-15  Today's Date: 01/10/2021 SLP Individual Time: 1345-1430 SLP Individual Time Calculation (min): 45 min  Short Term Goals: Week 3: SLP Short Term Goal 1 (Week 3): Patient will use external aids to orient to person/place/time/situation with sup A verbal cues SLP Short Term Goal 2 (Week 3): Patient will demonstrate selective attention by participating in functional tasks within a mildly distracting environment with only min A cues to redirect SLP Short Term Goal 3 (Week 3): Patient will mildly complex problem solving with sup A verbal cues SLP Short Term Goal 4 (Week 3): Patient will consume current diet without overt s/sx of aspiration and supervision assist for implementation of safe swallowing strategies  Skilled Therapeutic Interventions: Skilled ST treatment focused on swallowing and cognitive goals. Pt exhibited congested cough at baseline without presentation of PO intake which is consistent with previous observations. Pt expressed "I cough sometimes from sinus drainage". Patient performed oral care with set-up A prior to PO trials. Patient consumed single sips of thin liquids (water) while implementing effortful swallow with immediate cough response during 1/11 occasions. Patient required sup A cues for use of small sips. Following short delay, patient then consumed dysphagia 3 textures with mildly prolonged yet effective mastication, mild oral residue, and overt coughing x1. Pt utilized nectar thick liquid sips to assist oral clearance. Recommend continuation of dys 2 diet and nectar thick liquids. Cont PO trials prior to consideration of repeat instrumental. SLP facilitated orientation in which patient utilized external aid to orient to time with modified independence. Patient demonstrated selective attention to PO trials and cognitive tasks in a mildly distracting  environment for 30 minutes with mod I. Patient was left in _ with alarm activated and immediate needs within reach at end of session. Continue per current plan of care.      Pain Pain Assessment Pain Scale: 0-10 Pain Score: 0-No pain  Therapy/Group: Individual Therapy  Heather Mckendree T Graci Hulce 01/10/2021, 2:30 PM

## 2021-01-10 NOTE — Progress Notes (Signed)
Physical Therapy Session Note  Patient Details  Name: Alicia Guerrero MRN: [id number] Date of Birth: 23-Dec-1935  Today's Date: 01/10/2021 PT Individual Time: 1501-1600 PT Individual Time Calculation (min): 59 min   Short Term Goals: Week 1:  PT Short Term Goal 1 (Week 1): Pt will perform supine<>sit with max assist PT Short Term Goal 1 - Progress (Week 1): Met PT Short Term Goal 2 (Week 1): Pt will tolerate upright sitting on EOB/EOM for at least 2 minutes with no more than mod assist PT Short Term Goal 2 - Progress (Week 1): Met PT Short Term Goal 3 (Week 1): Pt will perform bed<>chair transfers with +2 max assist using  LRAD PT Short Term Goal 3 - Progress (Week 1): Met PT Short Term Goal 4 (Week 1): Pt will perform sit<>stands using LRAD with +2 max assist Week 2:  PT Short Term Goal 1 (Week 2): Pt will maintain sitting blance w/min assist during functional activity x 5 min PT Short Term Goal 1 - Progress (Week 2): Met PT Short Term Goal 2 (Week 2): Pt will perform sit to stand to AD w/min assist consistently PT Short Term Goal 2 - Progress (Week 2): Progressing toward goal PT Short Term Goal 3 (Week 2): Pt will ambulate 146f w/LRAD w/min assist PT Short Term Goal 3 - Progress (Week 2): Progressing toward goal  Skilled Therapeutic Interventions/Progress Updates:  Patient seated upright in TIS w/c on entrance to room. Patient alert and agreeable to PT session.   Patient with no pain complaint throughout session.  Therapeutic Activity: Transfers: Patient performed sit<>stand and stand pivot transfers throughout session with CGA/ close supervision. Provided vc for hand progression.  Gait Training:  Ambulation with focus on maintaining walker proximity and midline. Patient ambulated 125' x2 in each direction around Day Room and nursing station. Pt completes using RW with CGA/ close supervision.  Demonstrated need for constant cueing to improve proximity to walker, improve upright  posture. Turns to L are easier for pt to manage walker than turns to R. Cues provided for increased push with LUE and decreased push with RUE.  Neuromuscular Re-ed: NMR facilitated during session with focus on dynamic standing balance. Pt guided in dynamic step and reach activity with numbers on wall from below waist height to overhead. Numbers called for pt to touch targets with RUE with LUE supported on walker. Good balance throughout with one instance of foot positioning getting outside of walker frame and requiring MinA to reposition walker. NMR performed for improvements in motor control and coordination, balance, sequencing, judgement, and self confidence/ efficacy in performing all aspects of mobility at highest level of independence.   To return to bed, pt performs stand pivot w/c --> EOB with close supervision/ CGA using LUE on bedrail only. Patient performed supine <> sit with supervision. VC/ tc only required for final, neutral bed positioning.  Patient supine  in bed at end of session with brakes locked, bed alarm set, and all needs within reach. Son in room and NT arriving to take vitals.   Therapy Documentation Precautions:  Precautions Precautions: Fall, Other (comment) Precaution Comments: L side hemiparesis, left neglect, SBP goal 120-140 range Restrictions Weight Bearing Restrictions: No  Pain: Pain Assessment Pain Scale: 0-10 Pain Score: 0-No pain  Therapy/Group: Individual Therapy  JAlger SimonsPT, DPT 01/10/2021, 6:07 PM

## 2021-01-11 LAB — GLUCOSE, CAPILLARY
Glucose-Capillary: 123 mg/dL — ABNORMAL HIGH (ref 70–99)
Glucose-Capillary: 119 mg/dL — ABNORMAL HIGH (ref 70–99)
Glucose-Capillary: 133 mg/dL — ABNORMAL HIGH (ref 70–99)
Glucose-Capillary: 115 mg/dL — ABNORMAL HIGH (ref 70–99)

## 2021-01-11 NOTE — Progress Notes (Addendum)
Patient ID: Alicia Guerrero, female   DOB: 1935/07/21, 85 y.o.   MRN: [id number]  Patient [facility] referral sent to Nanine Means [facility]).   *Patient approved

## 2021-01-11 NOTE — Progress Notes (Signed)
Physical Therapy Session Note  Patient Details  Name: Alicia Guerrero MRN: [id number] Date of Birth: [birth year]  Today's Date: 01/11/2021 PT Individual Time: 1050-1203 PT Individual Time Calculation (min): 73 min   Short Term Goals: Week 4:  PT Short Term Goal 1 (Week 4): = to LTGs based on ELOS  Skilled Therapeutic Interventions/Progress Updates:   Pt received sitting in TIS w/c and agreeable to therapy session. Pt states she has been sitting in the chair so long she has "grown roots to it." Donned tennis shoes total assist for time management.  Transported to/from gym in w/c for time management and energy conservation.  Sit>stand w/c>RW with light min assist for balance - min assist to place L hand on RW orthosis with verbal cuing. When standing from w/c without AD cued pt to push up with both hands targeting L UE NMR.   Gait training ~140ft using RW with min assist for balance - continues to push AD too far forward and keep her body on the L side of the RW - slight reciprocal pattern with decreased R LE step length more than L - continues to have poor alternating/divided attention having difficulty focusing both on her walking steps and on AD management requiring repeated cuing to correct AD positioning - also has difficulty time managing AD due to L UE paresis though would be better if she could attend to it.  Gait training ~182ft 2x +4ft, no AD or UE support, with heavier min/light mod assist for balance - has R anterior lean/LOB and with fatigue has worsening shuffle gait on her toes with anterior LOB requiring assist to return upright -  with subsequent walks therapist provided slight manual facilitation for R/L weight shifting onto stance limb and this results in improved reciprocal pattern and improved upright posture because otherwise pt has short L stance causing short R step length - pt continues to have guarded UB positioning with lack of trunk rotation and arm swing.  Dynamic gait  training ~12ft, no UE support, to collect a set of cones and then carry them to place them on a table - pt actually has improved upright posture and gait while carrying the cones compared to not carrying anything. Progressed to similar task, but with the cones placed on various surfaces/tables to have pt then pick up with her L hand (required therapist to lie cone on their side so she could place her fingers inside of the cone to grasp it) - heavier min assist for balance during this task and continued repeated verbal cuing for upright posture as pt would progress to crouched stance.  Stair navigation training ascending/descending 4 (6" height) x2 using B HRs on ascent with step-to pattern leading with L LE for NMR and min assist for lifting up onto step and then step-to pattern using B UE support on L HR with side step pattern leading with L LE on descent - pt does not step L LE laterally enough to allow space for R foot on stair during descent and does not fully place foot up on step during ascent. When turning to sit in wheelchair pt returns to her crouched/squat standing posture requiring max verbal cuing to stand up straight.   At end of session pt transported back to room and left sitting in TIS w/c with needs in reach, L UE supported on pillow, and seat belt alarm on.    Therapy Documentation Precautions:  Precautions Precautions: Fall, Other (comment) Precaution Comments: L side hemiparesis, left  neglect, SBP goal 120-140 range Restrictions Weight Bearing Restrictions: No   Pain:  Denies pain during session.   Therapy/Group: Individual Therapy  Tawana Scale , PT, DPT, NCS, CSRS  01/11/2021, 8:03 AM

## 2021-01-11 NOTE — Progress Notes (Signed)
Occupational Therapy Session Note  Patient Details  Name: Alicia Guerrero MRN: [id number] Date of Birth: 03/18/1935  Today's Date: 01/11/2021 OT Individual Time: 7841-2820 OT Individual Time Calculation (min): 58 min    Short Term Goals: Week 3:  OT Short Term Goal 1 (Week 3): Pt will complete stand pivot transfer to the 3:1 with use of the RW and min assist for 2/3 trials. OT Short Term Goal 2 (Week 3): Pt will complete UB dressing with no more than mod assist for a pullover garment. OT Short Term Goal 3 (Week 3): Pt will complete UB bathing with min assist in unsupported sitting.  Skilled Therapeutic Interventions/Progress Updates:  Pt greeted upright in bed with HOB elevated at 40* with NT present setting pt up to eat breakfast.  Pt agreeable to OT intervention. Session focus on BADL reeducation, self feeding tasks, functional mobility, dynamic standing balance and decreasing overall burden of care.   Pt completed self feeding from upright position in bed with overall set- up assist with AD placed such as scoop dish and built up utensil. Pt using RUE to self feed but with encouragement does incorporate LUE into self feeding tasks but needs hand over hand assist for hand to mouth pattern, pt is able to grasp built up utensil with LUE but unable to supinate/pronate wrist without assist to scoop food. Pt completed supine>sit with L side with CGA with use of bed features. Sit<>stand form EOB with with cues for hand placement with CGA, stand pivot to w/c to R side with CGA. Pt transported to sink for grooming tasks, pt completed both sitting and standing grooming tasks supervision- MIN A with increased assist needed for dynamic balance tasks in standing. Education provided on  incorpating LUE into all ADL tasks even if just as stabilizer. Pt completed ~ 20 ft of functional mobility in hallway to facilitate independence with functional mobility during ADLs. Pt left seated in w/c with alarm belt  activated and all needs within reach.   Therapy Documentation Precautions:  Precautions Precautions: Fall, Other (comment) Precaution Comments: L side hemiparesis, left neglect, SBP goal 120-140 range Restrictions Weight Bearing Restrictions: No  Pain: no pain reported during session     Therapy/Group: Individual Therapy  Corinne Ports [facility] 01/11/2021, 9:00 AM

## 2021-01-11 NOTE — Progress Notes (Signed)
PROGRESS NOTE   Subjective/Complaints:  Pt reports has therapy at 8am- getting ready for that.  Fingers moving more on L hand per pt.  LBM this AM.  A little loose.   ROS:   Pt denies SOB, abd pain, CP, N/V/C/D, and vision changes    Objective:   No results found. Recent Labs    01/10/21 0737  WBC 10.0  HGB 13.0  HCT 40.8  PLT 328    Recent Labs    01/10/21 0737  NA 137  K 4.2  CL 104  CO2 24  GLUCOSE 119*  BUN 20  CREATININE 0.80  CALCIUM 9.2     Intake/Output Summary (Last 24 hours) at 01/11/2021 0926 Last data filed at 01/10/2021 1847 Gross per 24 hour  Intake 436 ml  Output --  Net 436 ml          Physical Exam: Vital Signs Blood pressure 124/74, pulse 73, temperature 98 F (36.7 C), resp. rate 16, height 5\' 3"  (1.6 m), weight 64.5 kg, SpO2 98 %.    General: awake, alert, appropriate, sitting up in bed - getting groomed; NAD HENT: conjugate gaze; oropharynx moist CV: regular rate; no JVD Pulmonary: CTA B/L; no W/R/R- good air movement GI: soft, NT, ND, (+)BS- hypoactive Psychiatric: appropriate; more interactive Neurological: dysarthria; more cognitively aware; MAS of 1- 1+ in LUE- esp at shoulder/elbow- slight increase in finger movement Ext: no clubbing, cyanosis, or edema Psych: pleasant and cooperative  Skin: No evidence of breakdown, no evidence of rash Neurologic: Oriented to person , hospital, month, stroke.  Cranial nerves II through XII intact, motor strength is 5/5 in Right deltoid, bicep, tricep, grip, hip flexor, knee extensors, ankle dorsiflexor and plantar flexor, 2- Left delt bic , tri, 2 grip, 3-4/5 L HF, KE ADF, Tone 1/4 left wrist finger flexors Sensory exam normal sensation to light touch and proprioception in bilateral upper and lower extremities  Musculoskeletal: Full range of motion in all 4 extremities. No joint swelling    Assessment/Plan: 1. Functional  deficits which require 3+ hours per day of interdisciplinary therapy in a comprehensive inpatient rehab setting. Physiatrist is providing close team supervision and 24 hour management of active medical problems listed below. Physiatrist and rehab team continue to assess barriers to discharge/monitor patient progress toward functional and medical goals  Care Tool:  Bathing    Body parts bathed by patient: Chest, Abdomen, Right upper leg, Left upper leg, Face, Left arm, Left lower leg, Right lower leg   Body parts bathed by helper: Right arm Body parts n/a: Front perineal area, Buttocks   Bathing assist Assist Level: Minimal Assistance - Patient > 75%     Upper Body Dressing/Undressing Upper body dressing   What is the patient wearing?: Pull over shirt    Upper body assist Assist Level: Maximal Assistance - Patient 25 - 49%    Lower Body Dressing/Undressing Lower body dressing      What is the patient wearing?: Pants     Lower body assist Assist for lower body dressing: Moderate Assistance - Patient 50 - 74%     Toileting Toileting    Toileting assist Assist for toileting:  Moderate Assistance - Patient 50 - 74%     Transfers Chair/bed transfer  Transfers assist  Chair/bed transfer activity did not occur: Safety/medical concerns  Chair/bed transfer assist level: Moderate Assistance - Patient 50 - 74% Chair/bed transfer assistive device: Armrests, Walker   Locomotion Ambulation   Ambulation assist   Ambulation activity did not occur: Safety/medical concerns  Assist level: Moderate Assistance - Patient 50 - 74% Assistive device: Walker-rolling Max distance: 190ft   Walk 10 feet activity   Assist  Walk 10 feet activity did not occur: Safety/medical concerns  Assist level: Moderate Assistance - Patient - 50 - 74% Assistive device: Walker-rolling   Walk 50 feet activity   Assist Walk 50 feet with 2 turns activity did not occur: Safety/medical  concerns  Assist level: Moderate Assistance - Patient - 50 - 74% Assistive device: Walker-rolling    Walk 150 feet activity   Assist Walk 150 feet activity did not occur: Safety/medical concerns         Walk 10 feet on uneven surface  activity   Assist Walk 10 feet on uneven surfaces activity did not occur: Safety/medical concerns         Wheelchair     Assist Is the patient using a wheelchair?: No             Wheelchair 50 feet with 2 turns activity    Assist            Wheelchair 150 feet activity     Assist          Blood pressure 124/74, pulse 73, temperature 98 F (36.7 C), resp. rate 16, height 5\' 3"  (1.6 m), weight 64.5 kg, SpO2 98 %.  Medical Problem List and Plan: 1. Functional deficits secondary to posterior circulation stroke             -patient may shower             -ELOS/Goals: 10-14 days S-         -Continue CIR- PT, OT and SLP 2.  Antithrombotics: -DVT/anticoagulation:  Pharmaceutical: Other (comment)--Eliquis             -antiplatelet therapy: ASA added  3. Pain Management: Tylenol prn.  4. Mood: LCSW to follow for evaluation and support.              -antipsychotic agents: N/A 5. Neuropsych: This patient is not fully capable of making decisions on capable own behalf. 6. Skin/Wound Care: Routine pressure relief measures 7. Fluids/Electrolytes/Nutrition:  encourage PO  12/26- labs look great- except Glucose of 119- everything is normal range- I personally assessed 8. CAF/ICM s/p ICD: Monitor HR TID. Continue Eliquis and bisoprolol.             --monitor for symptoms with increase in activity.   12/24- HR controlled- con't regimen 9. H/o CVA with L-HP: Was independent without AD PTA. 10. HTN: Monitor BP TID--SBP goal 120-140 range per neur due to MCA/PCA stenosis             --continue bisoprolol and Avapro.  Vitals:   01/10/21 2011 01/11/21 0553  BP: 113/74 124/74  Pulse: 66 73  Resp: 16 16  Temp: 97.9 F (36.6  C) 98 F (36.7 C)  SpO2: 97% 98%   12/24- BP overall controlled-borderline dbp today---follow for now  12/27- BP controlled- con't regimen 11. Dysphagia: Continue D1, nectar liquids.  .             -12/19  encouraged her to push through diet until advanced  12/27- up to D2 nectar thick liquids.  12. T2DM: Hgb A1C- 6.0. Continue to hold metformin             --monitor BS ac/hs and use SSI for now.  CBG (last 3)  Recent Labs    01/10/21 1622 01/10/21 2101 01/11/21 0756  GLUCAP 166* 206* 123*   12/27- Bgs a little labile- will monitor for trend.  13. Chronic intermittent insomnia: Will resume Melatonin (used 10 mg prn PTA) 14. Chronic congeseted cough: For 5-6 years and felt to be due to allergies. --added Claritin.    15.  Lethargy/confusion- resolved   labs ok, afebrile, no sedating meds   UCx no growth 16. Loose stools  12/27- not on anything for bowels- will monitor   LOS: 20 days A FACE TO FACE EVALUATION WAS PERFORMED  Rilla Buckman 01/11/2021, 9:26 AM

## 2021-01-11 NOTE — Progress Notes (Signed)
Speech Language Pathology Daily Session Note  Patient Details  Name: Alicia Guerrero MRN: [id number] Date of Birth: 1935/08/09  Today's Date: 01/11/2021 SLP Individual Time: 1410-1500 SLP Individual Time Calculation (min): 50 min  Short Term Goals: Week 3: SLP Short Term Goal 1 (Week 3): Patient will use external aids to orient to person/place/time/situation with sup A verbal cues SLP Short Term Goal 2 (Week 3): Patient will demonstrate selective attention by participating in functional tasks within a mildly distracting environment with only min A cues to redirect SLP Short Term Goal 3 (Week 3): Patient will mildly complex problem solving with sup A verbal cues SLP Short Term Goal 4 (Week 3): Patient will consume current diet without overt s/sx of aspiration and supervision assist for implementation of safe swallowing strategies  Skilled Therapeutic Interventions: Skilled ST treatment focused on swallowing and cognitive goals. Patient was sleeping semi reclined in bed on arrival and roused to moderate verbal stimuli. Pt expressed she felt "off" today and appeared more drowsy than usual. Patient less oriented, as she was only oriented to person independent of cues. She was oriented to place with modified independence for reference to external aid. Then even with compensatory aids, she was unable to accurately identify day/month/year and required min A verbal and visual cues for this information. Patient requested to use bathroom. Used stedy for transfer to and from bathroom. Patient had incontinence of bladder requiring change of brief and pants. While on toilet she had urine continence. Patient performed pulling up brief and pants with max A. She was returned to bed for remainder of session. Patient performed oral care using suction with set-up A prior to therapeutic PO trials. She was agreeable to consume thin liquid (water) trials. It should be noted patient continues to exhibit congested cough at  baseline without PO intake. Patient consumed single sips of thin liquids with delayed cough during 2/8 trials. Patient known to execute secondary swallow during more than 50% of trials. Recommend continuation of thin liquid trials for tolerance prior to consideration on repeat MBS. Patient was left in bed with alarm activated and immediate needs within reach at end of session. Continue per current plan of care.      Pain Pain Assessment Pain Scale: 0-10 Pain Score: 0-No pain  Therapy/Group: Individual Therapy  Patty Sermons 01/11/2021, 2:58 PM

## 2021-01-11 NOTE — Progress Notes (Signed)
Physical Therapy Weekly Progress Note  Patient Details  Name: Alicia Guerrero MRN: [id number] Date of Birth: August 27, 1935  Beginning of progress report period: January 05, 2021 End of progress report period: January 11, 2021  Patient has met 2 of 4 short term goals. Ms. Pavlak is progressing well with therapy demonstrating increasing independence with functional mobility. She continues to have cognitive impairments with poor alternating and divided attention that result in need for increased physical assistance when performing dual-task during functional mobility. She is performing supine<>sit with min assist, sit<>stands using LRAD with min assist, stand pivot transfers using RW with min/mod assist, and ambulating up to 162f using RW with min/mod assist. She does have poor AD management due to L inattention, L UE paresis, and impaired alternating attention requiring significant verbal cuing and physical assistance to correct this. She has progressed towards stair navigation training ascending/descending 4 steps using BHRs with mod assist for balance in preparation for home entry pending if family is able to have a ramp installed. Pt will benefit from continued CIR level therapies until D/C home with 24hr assistance.   Patient continues to demonstrate the following deficits muscle weakness and muscle joint tightness, decreased cardiorespiratoy endurance, impaired timing and sequencing, abnormal tone, unbalanced muscle activation, and decreased motor planning, decreased visual perceptual skills and decreased visual motor skills, decreased midline orientation and decreased attention to left, decreased initiation, decreased attention, decreased awareness, decreased problem solving, decreased safety awareness, decreased memory, and delayed processing, and decreased sitting balance, decreased standing balance, decreased postural control, hemiplegia, and decreased balance strategies and therefore will continue  to benefit from skilled PT intervention to increase functional independence with mobility.  Patient progressing toward long term goals..  Continue plan of care.  PT Short Term Goals Week 3:  PT Short Term Goal 1 (Week 3): Pt will perform supine<>sit with min assist consistently PT Short Term Goal 1 - Progress (Week 3): Met PT Short Term Goal 2 (Week 3): Pt will perform sit<>stand using LRAD with min assist consistently PT Short Term Goal 2 - Progress (Week 3): Met PT Short Term Goal 3 (Week 3): Pt will perform bed<>chair transfers using LRAD with min assist consistently PT Short Term Goal 3 - Progress (Week 3): Progressing toward goal PT Short Term Goal 4 (Week 3): Pt will ambulate at least 532fusing LRAD with min assist consistently (including some turning) PT Short Term Goal 4 - Progress (Week 3): Progressing toward goal Week 4:  PT Short Term Goal 1 (Week 4): = to LTGs based on ELOS  Skilled Therapeutic Interventions/Progress Updates:  Ambulation/gait training;Community reintegration;DME/adaptive equipment instruction;Neuromuscular re-education;Psychosocial support;Stair training;UE/LE Strength taining/ROM;Wheelchair propulsion/positioning;Balance/vestibular training;Discharge planning;Functional electrical stimulation;Pain management;Skin care/wound management;Therapeutic Activities;UE/LE Coordination activities;Cognitive remediation/compensation;Disease management/prevention;Functional mobility training;Patient/family education;Splinting/orthotics;Therapeutic Exercise;Visual/perceptual remediation/compensation   Therapy Documentation Precautions:  Precautions Precautions: Fall, Other (comment) Precaution Comments: L side hemiparesis, left neglect, SBP goal 120-140 range Restrictions Weight Bearing Restrictions: No   CaTawana Scale PT, DPT, NCS, CSRS 01/11/2021, 12:56 PM

## 2021-01-11 NOTE — Progress Notes (Signed)
Patient ID: Alicia Guerrero, female   DOB: 21-Oct-1935, 85 y.o.   MRN: [id number]   Rolling Walker ordered through [facility].  [place], [place]

## 2021-01-12 LAB — GLUCOSE, CAPILLARY
Glucose-Capillary: 167 mg/dL — ABNORMAL HIGH (ref 70–99)
Glucose-Capillary: 116 mg/dL — ABNORMAL HIGH (ref 70–99)
Glucose-Capillary: 153 mg/dL — ABNORMAL HIGH (ref 70–99)
Glucose-Capillary: 152 mg/dL — ABNORMAL HIGH (ref 70–99)

## 2021-01-12 MED ORDER — AMLODIPINE BESYLATE 10 MG PO TABS
10.0000 mg | ORAL_TABLET | Freq: Every day | ORAL | Status: DC
  Administered 2021-01-13 – 2021-01-19 (×7): 10 mg via ORAL
  Filled 2021-01-12 (×7): qty 1

## 2021-01-12 NOTE — Progress Notes (Signed)
PROGRESS NOTE   Subjective/Complaints: Team conference today Plan for [facility] Friday depending on toleration of thin liquid trials.  BP elevated today, has been soft before She has no complaints  ROS:   Pt denies SOB, abd pain, CP, N/V/C/D, and vision changes    Objective:   No results found. Recent Labs    01/10/21 0737  WBC 10.0  HGB 13.0  HCT 40.8  PLT 328    Recent Labs    01/10/21 0737  NA 137  K 4.2  CL 104  CO2 24  GLUCOSE 119*  BUN 20  CREATININE 0.80  CALCIUM 9.2     Intake/Output Summary (Last 24 hours) at 01/12/2021 1053 Last data filed at 01/12/2021 0800 Gross per 24 hour  Intake 480 ml  Output --  Net 480 ml          Physical Exam: Vital Signs Blood pressure (!) 154/81, pulse 66, temperature 97.8 F (36.6 C), temperature source Oral, resp. rate 20, height 5\' 3"  (1.6 m), weight 64.5 kg, SpO2 97 %. Gen: no distress, normal appearing HEENT: oral mucosa pink and moist, NCAT Cardio: Reg rate Chest: normal effort, normal rate of breathing Abd: soft, non-distended Ext: no edema Psych: pleasant, normal affect Skin: intact Neurological: dysarthria; more cognitively aware; MAS of 1- 1+ in LUE- esp at shoulder/elbow- slight increase in finger movement Ext: no clubbing, cyanosis, or edema Psych: pleasant and cooperative  Skin: No evidence of breakdown, no evidence of rash Neurologic: Oriented to person , hospital, month, stroke.  Cranial nerves II through XII intact, motor strength is 5/5 in Right deltoid, bicep, tricep, grip, hip flexor, knee extensors, ankle dorsiflexor and plantar flexor, 2- Left delt bic , tri, 2 grip, 3-4/5 L HF, KE ADF, Tone 1/4 left wrist finger flexors Sensory exam normal sensation to light touch and proprioception in bilateral upper and lower extremities  Musculoskeletal: Full range of motion in all 4 extremities. No joint swelling    Assessment/Plan: 1.  Functional deficits which require 3+ hours per day of interdisciplinary therapy in a comprehensive inpatient rehab setting. Physiatrist is providing close team supervision and 24 hour management of active medical problems listed below. Physiatrist and rehab team continue to assess barriers to discharge/monitor patient progress toward functional and medical goals  Care Tool:  Bathing    Body parts bathed by patient: Chest, Abdomen, Right upper leg, Left upper leg, Face, Left arm, Left lower leg, Right lower leg   Body parts bathed by helper: Right arm Body parts n/a: Front perineal area, Buttocks   Bathing assist Assist Level: Minimal Assistance - Patient > 75%     Upper Body Dressing/Undressing Upper body dressing   What is the patient wearing?: Pull over shirt    Upper body assist Assist Level: Maximal Assistance - Patient 25 - 49%    Lower Body Dressing/Undressing Lower body dressing      What is the patient wearing?: Pants     Lower body assist Assist for lower body dressing: Moderate Assistance - Patient 50 - 74%     Toileting Toileting    Toileting assist Assist for toileting: Moderate Assistance - Patient 50 - 74%  Transfers Chair/bed transfer  Transfers assist  Chair/bed transfer activity did not occur: Safety/medical concerns  Chair/bed transfer assist level: Minimal Assistance - Patient > 75% Chair/bed transfer assistive device: Armrests, Programmer, multimedia   Ambulation assist   Ambulation activity did not occur: Safety/medical concerns  Assist level: Minimal Assistance - Patient > 75% Assistive device: Walker-rolling Max distance: 166ft   Walk 10 feet activity   Assist  Walk 10 feet activity did not occur: Safety/medical concerns  Assist level: Moderate Assistance - Patient - 50 - 74% Assistive device: Walker-rolling   Walk 50 feet activity   Assist Walk 50 feet with 2 turns activity did not occur: Safety/medical  concerns  Assist level: Moderate Assistance - Patient - 50 - 74% Assistive device: Walker-rolling    Walk 150 feet activity   Assist Walk 150 feet activity did not occur: Safety/medical concerns         Walk 10 feet on uneven surface  activity   Assist Walk 10 feet on uneven surfaces activity did not occur: Safety/medical concerns         Wheelchair     Assist Is the patient using a wheelchair?: No             Wheelchair 50 feet with 2 turns activity    Assist            Wheelchair 150 feet activity     Assist          Blood pressure (!) 154/81, pulse 66, temperature 97.8 F (36.6 C), temperature source Oral, resp. rate 20, height 5\' 3"  (1.6 m), weight 64.5 kg, SpO2 97 %.  Medical Problem List and Plan: 1. Functional deficits secondary to posterior circulation stroke             -patient may shower             -ELOS/Goals: 10-14 days S-         -Continue CIR- PT, OT and SLP 2.  Antithrombotics: -DVT/anticoagulation:  Pharmaceutical: Other (comment)--Eliquis             -antiplatelet therapy: ASA added  3. Pain Management: Tylenol prn.  4. Mood: LCSW to follow for evaluation and support.              -antipsychotic agents: N/A 5. Neuropsych: This patient is not fully capable of making decisions on capable own behalf. 6. Skin/Wound Care: Routine pressure relief measures 7. Fluids/Electrolytes/Nutrition:  encourage PO  12/26- labs look great- except Glucose of 119- everything is normal range- I personally assessed 8. CAF/ICM s/p ICD: Monitor HR TID. Continue Eliquis and bisoprolol.             --monitor for symptoms with increase in activity.   12/24- HR controlled- con't regimen 9. H/o CVA with L-HP: Was independent without AD PTA. 10. HTN: Monitor BP TID--SBP goal 120-140 range per neur due to MCA/PCA stenosis             --continue bisoprolol and Avapro. Increase amlodipine to 10mg .  Vitals:   01/11/21 1855 01/12/21 0442  BP:  117/61 (!) 154/81  Pulse: 65 66  Resp: 19 20  Temp: 98 F (36.7 C) 97.8 F (36.6 C)  SpO2: 96% 97%    11. Dysphagia: Continue D1, nectar liquids.  .             -12/19 encouraged her to push through diet until advanced  12/27- up to D2 nectar thick liquids. MBS  on Friday 12. T2DM: Hgb A1C- 6.0. Continue to hold metformin             --monitor BS ac/hs and use SSI for now.  CBG (last 3)  Recent Labs    01/11/21 1633 01/11/21 2116 01/12/21 0844  GLUCAP 133* 115* 167*   12/27- Bgs a little labile- will monitor for trend.  13. Chronic intermittent insomnia: Will resume Melatonin (used 10 mg prn PTA) 14. Chronic congeseted cough: For 5-6 years and felt to be due to allergies. --added Claritin.    15.  Lethargy/confusion- resolved   labs ok, afebrile, no sedating meds   UCx no growth 16. Loose stools  12/27- not on anything for bowels- will monitor   LOS: 21 days A FACE TO FACE EVALUATION WAS PERFORMED  Alicia Guerrero 01/12/2021, 10:53 AM

## 2021-01-12 NOTE — Progress Notes (Signed)
Physical Therapy Session Note  Patient Details  Name: Alicia Guerrero MRN: [id number] Date of Birth: [birth year]  Today's Date: 01/12/2021 PT Individual Time: 1470-9295 PT Individual Time Calculation (min): 18 min  and Today's Date: 01/12/2021 PT Missed Time: 12 Minutes Missed Time Reason: Patient fatigue  Short Term Goals: Week 4:  PT Short Term Goal 1 (Week 4): = to LTGs based on ELOS  Skilled Therapeutic Interventions/Progress Updates:    Patient received sitting up in wc, agreeable to PT. She denies pain, but requests to use the bathroom. CGA/MinA ambulatory transfer into bathroom. Patient with continent void. MinA for clothing management in standing with cues to engage L UE. Patient requesting to return to bed due to fatigue and "being up all morning." PT unable to encourage patient to continue to participate in therapy. CGA ambulatory transfer to bed. Supervision to return supine and reposition in bed. Bed alarm on, call light within reach.   Therapy Documentation Precautions:  Precautions Precautions: Fall, Other (comment) Precaution Comments: L side hemiparesis, left neglect, SBP goal 120-140 range Restrictions Weight Bearing Restrictions: No     Therapy/Group: Individual Therapy  Karoline Caldwell, PT, DPT, CBIS  01/12/2021, 7:52 AM

## 2021-01-12 NOTE — Progress Notes (Signed)
Occupational Therapy Session Note  Patient Details  Name: Alicia Guerrero MRN: [id number] Date of Birth: [birth year]  Today's Date: 01/12/2021 OT Individual Time: 9217-8375 OT Individual Time Calculation (min): 40 min    Short Term Goals: Week 3:  OT Short Term Goal 1 (Week 3): Pt will complete stand pivot transfer to the 3:1 with use of the RW and min assist for 2/3 trials. OT Short Term Goal 2 (Week 3): Pt will complete UB dressing with no more than mod assist for a pullover garment. OT Short Term Goal 3 (Week 3): Pt will complete UB bathing with min assist in unsupported sitting.  Skilled Therapeutic Interventions/Progress Updates:  Patient met lying supine in bed in agreement with OT treatment session. Mild pain reported at rest and with activity in L hand. Supine to EOB with HOB slightly elevated and assist to elevate trunk. Patient able to complete anterior scoots toward EOB with Min guard for steadying. Sit to stand from EOB with Min A and cues for hand placement. Functional mobility to commode in bathroom with use of RW and external assist to maintain balance. Cues for proximity to RW. Toilet transfer and 3/3 parts of toileting task with Mod -Min A overall respectively. Patient then able to don LB clothing seated on BSC with Mod A to thread BLE and hike over hips in standing. Mod A to don UB clothing with repeat reminders for hemi technique. Patient with desire to wash hair but unable 2/2 position of headrest on TIS wc. Session concluded with patient seated in TIS wc with call bell within reach, belt alarm activated and all needs met.   Therapy Documentation Precautions:  Precautions Precautions: Fall, Other (comment) Precaution Comments: L side hemiparesis, left neglect, SBP goal 120-140 range Restrictions Weight Bearing Restrictions: No General:    Therapy/Group: Individual Therapy  Fin Hupp R [place] 01/12/2021, 6:48 AM

## 2021-01-12 NOTE — Progress Notes (Signed)
Occupational Therapy Weekly Progress Note  Patient Details  Name: Alicia Guerrero MRN: [id number] Date of Birth: 1935/09/12  Beginning of progress report period: January 06, 2021 End of progress report period: January 12, 2021  Today's Date: 01/12/2021 OT Individual Time: 1304-1400 OT Individual Time Calculation (min): 56 min    Patient has met 3 of 3 short term goals.  Ms. Hoots continues to make steady progress with OT at this time.  She is able to complete most bathing at min assist level sit to stand, with min assist for UB bathing sitting unsupported.  She still exhibits a posterior pelvic tilt with increased posterior lean at times, but is able to correct her balance with min instructional cueing during bathing tasks.  She continues to need mod assist for donning a pullover shirt with max assist when wearing a bra.  She also needs mod assist for donning brief and pants as well as her lace up shoes.  She currently cannot tie them secondary to moderate LUE hemiparesis, which is currently at a Brunnstrum stage IV in the arm and hand.  She is able to use it currently at a gross assist level for selfcare tasks at this time with supervision and at a diminished level with over mod facilitation.  She is completing toilet transfers at min assist with the RW for support, but does tend to need mod instructional cueing to stay closer to the walker as she tends to stand too far away from it.  She needs min to mod assist to complete clothing management and toilet hygiene sit to stand as well.  Confusion has improved with pt demonstrating some intellectual awareness of her deficits, but still with some disorientation at times with regards to day of the week or day of the month.  She is able to state difficulty with her balance as well as the LUE.  Feel overall she is making steady progress with OT and is currently scheduled to discharge on 1/4 home with family and 24 hr supervision.  Will recommend continued  CIR level therapy at this time to continue progression toward min assist level goals.  Family education currently scheduled for 1/2-1/3 with her son who will provide 24 hr assist.      Patient continues to demonstrate the following deficits: muscle weakness, muscle joint tightness, and muscle paralysis, impaired timing and sequencing, unbalanced muscle activation, and decreased coordination, decreased attention to left, decreased problem solving and decreased memory, and decreased sitting balance, decreased standing balance, decreased postural control, hemiplegia, and decreased balance strategies and therefore will continue to benefit from skilled OT intervention to enhance overall performance with BADL and Reduce care partner burden.  Patient progressing toward long term goals..  Continue plan of care.  OT Short Term Goals Week 4:  OT Short Term Goal 1 (Week 4): Continue working on established LTGs set at min assist overall.  Skilled Therapeutic Interventions/Progress Updates:    Pt in bed to start with transfer to the left EOB with min assist to begin session.  She worked on donning her lace up shoes from the EOB with mod assist secondary to not being able to get the left heel in and then not being able to tie either shoe.  She was able to then complete stand pivot transfer to the wheelchair at min assist level and no device.  She was then taken down to the therapy gym where she began with work on LUE strengthening with use of the UE ergonometer.  She was able to complete 3 intervals of 2 mins each, with the first using both UEs and the final 2 with just the LUE.  She needed min to mod assist for revolutions with the LUE when isolated in order to keep from demonstrating shoulder hike.  She was able to exhibit some active shoulder flexion with elbow extension however.  Next, had her work on standing at the high/low table while having to pick up 1" wooden blocks and place in a container.  Min to mod  facilitation for shoulder flexion to reach the blocks and then place them, however she was able to close and open her hand far enough without facilitation from therapist.  She needed min assist for standing balance with mod demonstrational cueing to maintain knee extension in standing.  Finished session with return to the room where pt was left sitting up with the safety belt in place, lap tray supporting BUEs, and call button and phone in reach.    Therapy Documentation Precautions:  Precautions Precautions: Fall, Other (comment) Precaution Comments: L side hemiparesis, left neglect, SBP goal 120-140 range Restrictions Weight Bearing Restrictions: No  Pain: Pain Assessment Pain Scale: Faces Pain Score: 0-No pain    Therapy/Group: Individual Therapy  Claudeen Leason OTR/L 01/12/2021, 4:13 PM

## 2021-01-12 NOTE — Progress Notes (Signed)
Physical Therapy Session Note  Patient Details  Name: Alicia Guerrero MRN: [id number] Date of Birth: 03/09/1935  Today's Date: 01/12/2021 PT Individual Time: 0272-5366 PT Individual Time Calculation (min): 62 min   Short Term Goals: Week 3:  PT Short Term Goal 1 (Week 3): Pt will perform supine<>sit with min assist consistently PT Short Term Goal 1 - Progress (Week 3): Met PT Short Term Goal 2 (Week 3): Pt will perform sit<>stand using LRAD with min assist consistently PT Short Term Goal 2 - Progress (Week 3): Met PT Short Term Goal 3 (Week 3): Pt will perform bed<>chair transfers using LRAD with min assist consistently PT Short Term Goal 3 - Progress (Week 3): Progressing toward goal PT Short Term Goal 4 (Week 3): Pt will ambulate at least 38f using LRAD with min assist consistently (including some turning) PT Short Term Goal 4 - Progress (Week 3): Progressing toward goal  Skilled Therapeutic Interventions/Progress Updates:  Patient seated upright in TIS w/c on entrance to room. Patient alert and agreeable to PT session.   Patient with no pain complaint throughout session.  Therapeutic Activity: Transfers: Patient performed sit<>stand transfers throughout session with light CGA and improving to close supervision. She performs stand pivot transfers with CGA and vc for technique throughout.  Gait Training:  Short distance ambulation of 15' x1 with BUE support on therapist's forearms and CGA. Pt does not place much downward pressure into therapist for support during ambulation. Significant decrease in step length. Patient ambulated 110' x1/ 130' x1 using RW with CGA and up to MinA at end of final bout d/t fatigue and poor decision making in narrow step width during R turn. Demonstrated forward flexed posture and intermittent decreased step length on L. Provided vc/ tc for proximity to walker, level gaze, increased step length, and improved heel strike.  Neuromuscular Re-ed: NMR  facilitated during session with focus on standing balance, proprioception. Pt guided in sit<>stand performance with no AD. Provided with NDT verbal cueing for positioning and anterior weight shifting. Pt progresses from RUE on therapist's arm to pushing from seat to pushing from R thigh. She also progresses in demonstrating posterior lean to bringing shoulders more forward and toes to floor prior to stance and more stable rise into upright posture with reduced sway.   Guided pt in standing activity using 1# weighted bar and BUE use to hit bar to target on board in front of pt, just within BOS. Active assist provided to LUE for improved upward scapular rotation and full extension to target. Pt performs well 2 sets x5 reps. Adjusted object to 1#weighted ball requiring BUE adduction to hold ball and extend to targets on board. This proved more difficult and required up to Min/ ModA to maintain L hand contact with ball and extend arm. Completed 1x5 reps.  NMR performed for improvements in motor control and coordination, balance, sequencing, judgement, and self confidence/ efficacy in performing all aspects of mobility at highest level of independence.   Patient seated upright  in TIS w/c at end of session with brakes locked, belt alarm set, and all needs within reach.     Therapy Documentation Precautions:  Precautions Precautions: Fall, Other (comment) Precaution Comments: L side hemiparesis, left neglect, SBP goal 120-140 range Restrictions Weight Bearing Restrictions: No General:   Pain:  No pain complaint this session.   Therapy/Group: Individual Therapy  JAlger SimonsPT, DPT 01/12/2021, 10:10 AM

## 2021-01-12 NOTE — Patient Care Conference (Signed)
Inpatient RehabilitationTeam Conference and Plan of Care Update Date: 01/12/2021   Time: 10:49 AM    Patient Name: Alicia Guerrero      Medical Record Number: [id number]  Date of Birth: 1935/06/02 Sex: Female         Room/Bed: 6P59F/6B84Y-65 Payor Info: Payor: [employer] MEDICARE / Plan: [place] [employer] MEDICARE HMO/PPO / Product Type: *No Product type* /    Admit Date/Time:  12/22/2020  2:13 PM  Primary Diagnosis:  Stroke (cerebrum) [facility])  Hospital Problems: Principal Problem:   Stroke (cerebrum) [facility]) Active Problems:   Pressure injury of skin    Expected Discharge Date: Expected Discharge Date: 01/19/21  Team Members Present: Physician leading conference: Dr. Leeroy Cha Social Worker Present: Erlene Quan, BSW Nurse Present: Dorien Chihuahua, RN PT Present: Alden Hipp, PT OT Present: Clyda Greener, OT SLP Present: Sherren Kerns, SLP     Current Status/Progress Goal Weekly Team Focus  Bowel/Bladder   Pt last BM was 01/11/21 and she incontinent of both bowel and bladder only because she tells you after she has gone  Pt will be able to regain continent of bowel and bladder  Pt will gain a schedules bowel and bladder regime   Swallow/Nutrition/ Hydration   dys 2 diet, NTL, sup-to-min A  sup A  dys 3 trials, thin liquid trials (water only)   ADL's   pt is currently MIN A ofr UB bathing, MAX A for UB dressing with OH shirt, MOD A for LB dressing and MOD A for toileting.  MIN A level  BADL reeducation, tranfser training, DME education, NMR, family education   Mobility   CGA/SPV bed mob, CGA STS, CGA transfers using RW, MinA/ModA gait 190ft+ RW, poor midline awareness, needs help managing AD due to LUE paresis  min assist overall at ambulatory level  pt/fam ed, dc planning, L hemi NMR, transfers, gait progressions, dual task, balance   Communication   mod I  mod I  carry over of speech intelligbility strategies   Safety/Cognition/ Behavioral Observations  min A  sup-to-min A   orientation, basic problem solving, emergent/anticipatory awareness, functional recall   Pain   Pt has been pain free  Pt will stay pain free  Pt will stay pain free the rest of her hospital stay   Skin   Pt has a foam for protection on her sacrum  Foam dressing will keep pressure off pt bottom to prevent bed sores  Pt will have no bed sores at d/c     Discharge Planning:  Discharging home with son and family to assist, Spouse unable to provide physical assistance   Team Discussion: Patient with loose stools and lethargy. Insomnia addressed; MD added melatonin. Claritin on board for cough. Arthritis in hand limits function.  Patient on target to meet rehab goals: yes, currently needs mi - mod assist for upper body care and mod - max for lower body care.  Sit - stand transfers with supervision and stand pivot with CGA. Able to ambulate up to 150' using a rolling walker with min assist for walker management.   *See Care Plan and progress notes for long and short-term goals.   Revisions to Treatment Plan:  D3 diet trials thin liquid trials MBS scheduled for 01/14/21   Teaching Needs: Safety, toileting, transfers, medication management, secondary risk management, etc.   Current Barriers to Discharge: Decreased caregiver support and Home enviroment access/layout  Possible Resolutions to Barriers:  Family education with spouse and son 24/7 assist recommended  Medical Summary Current Status: loose stools, lethary/confusion, left sided hemiparesis, CVA, CMC arthritis, HTN, atrial fibrillation  Barriers to Discharge: Neurogenic Bowel & Bladder;Medical stability;Behavior  Barriers to Discharge Comments: loose stools, lethary/confusion, left sided hemiparesis, CVA, CMC arthritis, HTN, atrial fibrillation Possible Resolutions to [employer]: continue eliquis and continue to monitor HR and BP TID, continue amlipidine and titrate as needed, continue aspirin and Zebeta, continue  bowel and bladder program   Continued Need for Acute Rehabilitation Level of Care: The patient requires daily medical management by a physician with specialized training in physical medicine and rehabilitation for the following reasons: Direction of a multidisciplinary physical rehabilitation program to maximize functional independence : Yes Medical management of patient stability for increased activity during participation in an intensive rehabilitation regime.: Yes Analysis of laboratory values and/or radiology reports with any subsequent need for medication adjustment and/or medical intervention. : Yes   I attest that I was present, lead the team conference, and concur with the assessment and plan of the team.   Dorien Chihuahua B 01/12/2021, 3:28 PM

## 2021-01-13 LAB — GLUCOSE, CAPILLARY
Glucose-Capillary: 124 mg/dL — ABNORMAL HIGH (ref 70–99)
Glucose-Capillary: 149 mg/dL — ABNORMAL HIGH (ref 70–99)
Glucose-Capillary: 153 mg/dL — ABNORMAL HIGH (ref 70–99)
Glucose-Capillary: 151 mg/dL — ABNORMAL HIGH (ref 70–99)

## 2021-01-13 NOTE — Progress Notes (Signed)
PROGRESS NOTE   Subjective/Complaints:  MBS Friday.  Pt reports about to have BM- called for nursing- LBM 2 days prior.   No complaints- tolerating nectar thick liquids   ROS:   Pt denies SOB, abd pain, CP, N/V/C/D, and vision changes     Objective:   No results found. No results for input(s): WBC, HGB, HCT, PLT in the last 72 hours.   No results for input(s): NA, K, CL, CO2, GLUCOSE, BUN, CREATININE, CALCIUM in the last 72 hours.    Intake/Output Summary (Last 24 hours) at 01/13/2021 1008 Last data filed at 01/13/2021 0720 Gross per 24 hour  Intake 720 ml  Output --  Net 720 ml          Physical Exam: Vital Signs Blood pressure 132/76, pulse 66, temperature (!) 97.5 F (36.4 C), temperature source Oral, resp. rate 16, height 5\' 3"  (1.6 m), weight 63.4 kg, SpO2 98 %.  General: awake, alert, appropriate, sitting up in bed; LUE elevated on pillow; NAD HENT: conjugate gaze; oropharynx moist CV: regular rate; no JVD Pulmonary: CTA B/L; no W/R/R- good air movement GI: soft, NT, ND, (+)BS- hyperactive Psychiatric: appropriate- more interactive; but slightly flat Neurological: Ox3; MAS of 1- 1+ in LUE- esp at shoulder/elbow- slight increase in finger movement- looks the same today Ext: no clubbing, cyanosis, or edema Psych: pleasant and cooperative  Skin: No evidence of breakdown, no evidence of rash Neurologic: Oriented to person , hospital, month, stroke.  Cranial nerves II through XII intact, motor strength is 5/5 in Right deltoid, bicep, tricep, grip, hip flexor, knee extensors, ankle dorsiflexor and plantar flexor, 2- Left delt bic , tri, 2 grip, 3-4/5 L HF, KE ADF, Tone 1/4 left wrist finger flexors Sensory exam normal sensation to light touch and proprioception in bilateral upper and lower extremities  Musculoskeletal: Full range of motion in all 4 extremities. No joint  swelling    Assessment/Plan: 1. Functional deficits which require 3+ hours per day of interdisciplinary therapy in a comprehensive inpatient rehab setting. Physiatrist is providing close team supervision and 24 hour management of active medical problems listed below. Physiatrist and rehab team continue to assess barriers to discharge/monitor patient progress toward functional and medical goals  Care Tool:  Bathing    Body parts bathed by patient: Chest, Abdomen, Right upper leg, Left upper leg, Face, Left arm, Left lower leg, Right lower leg   Body parts bathed by helper: Right arm Body parts n/a: Front perineal area, Buttocks   Bathing assist Assist Level: Minimal Assistance - Patient > 75%     Upper Body Dressing/Undressing Upper body dressing   What is the patient wearing?: Pull over shirt    Upper body assist Assist Level: Maximal Assistance - Patient 25 - 49%    Lower Body Dressing/Undressing Lower body dressing      What is the patient wearing?: Pants     Lower body assist Assist for lower body dressing: Moderate Assistance - Patient 50 - 74%     Toileting Toileting    Toileting assist Assist for toileting: Moderate Assistance - Patient 50 - 74%     Transfers Chair/bed transfer  Transfers assist  Chair/bed transfer activity did not occur: Safety/medical concerns  Chair/bed transfer assist level: Contact Guard/Touching assist Chair/bed transfer assistive device: Armrests, Programmer, multimedia   Ambulation assist   Ambulation activity did not occur: Safety/medical concerns  Assist level: Minimal Assistance - Patient > 75% Assistive device: Walker-rolling Max distance: 130 ft   Walk 10 feet activity   Assist  Walk 10 feet activity did not occur: Safety/medical concerns  Assist level: Contact Guard/Touching assist Assistive device: Walker-rolling   Walk 50 feet activity   Assist Walk 50 feet with 2 turns activity did not occur:  Safety/medical concerns  Assist level: Minimal Assistance - Patient > 75% Assistive device: Walker-rolling    Walk 150 feet activity   Assist Walk 150 feet activity did not occur: Safety/medical concerns         Walk 10 feet on uneven surface  activity   Assist Walk 10 feet on uneven surfaces activity did not occur: Safety/medical concerns         Wheelchair     Assist Is the patient using a wheelchair?: No             Wheelchair 50 feet with 2 turns activity    Assist            Wheelchair 150 feet activity     Assist          Blood pressure 132/76, pulse 66, temperature (!) 97.5 F (36.4 C), temperature source Oral, resp. rate 16, height 5\' 3"  (1.6 m), weight 63.4 kg, SpO2 98 %.  Medical Problem List and Plan: 1. Functional deficits secondary to posterior circulation stroke             -patient may shower             -ELOS/Goals: 10-14 days S-         -Continue CIR- PT, OT and SLP 2.  Antithrombotics: -DVT/anticoagulation:  Pharmaceutical: Other (comment)--Eliquis             -antiplatelet therapy: ASA added  3. Pain Management: Tylenol prn.  4. Mood: LCSW to follow for evaluation and support.              -antipsychotic agents: N/A 5. Neuropsych: This patient is not fully capable of making decisions on capable own behalf. 6. Skin/Wound Care: Routine pressure relief measures 7. Fluids/Electrolytes/Nutrition:  encourage PO  12/26- labs look great- except Glucose of 119- everything is normal range- I personally assessed 8. CAF/ICM s/p ICD: Monitor HR TID. Continue Eliquis and bisoprolol.             --monitor for symptoms with increase in activity.   12/24- HR controlled- con't regimen 9. H/o CVA with L-HP: Was independent without AD PTA. 10. HTN: Monitor BP TID--SBP goal 120-140 range per neur due to MCA/PCA stenosis             --continue bisoprolol and Avapro. Increase amlodipine to 10mg .  Vitals:   01/12/21 2000 01/13/21 0428   BP: 122/60 132/76  Pulse: 60 66  Resp: 16 16  Temp: 98.1 F (36.7 C) (!) 97.5 F (36.4 C)  SpO2: 97% 98%    11. Dysphagia: Continue D1, nectar liquids.  .             -12/19 encouraged her to push through diet until advanced  12/27- up to D2 nectar thick liquids. MBS on Friday  12/29- MBS tomorrow- will con't to monitor 12. T2DM: Hgb A1C- 6.0. Continue  to hold metformin             --monitor BS ac/hs and use SSI for now.  CBG (last 3)  Recent Labs    01/12/21 1630 01/12/21 2134 01/13/21 0611  GLUCAP 153* 152* 124*   12/29- CBGs controlled- con't regimen 13. Chronic intermittent insomnia: Will resume Melatonin (used 10 mg prn PTA) 14. Chronic congeseted cough: For 5-6 years and felt to be due to allergies. --added Claritin.    15.  Lethargy/confusion- resolved   labs ok, afebrile, no sedating meds   UCx no growth 16. Loose stools  12/27- not on anything for bowels- will monitor  12/29- LBM this AM- will monitor   LOS: 22 days A FACE TO FACE EVALUATION WAS PERFORMED  Firman Petrow 01/13/2021, 10:08 AM

## 2021-01-13 NOTE — Progress Notes (Signed)
Patient ID: Alicia Guerrero, female   DOB: 1935/05/30, 84 y.o.   MRN: [id number]   Sw left patient son a VM to provide conference updates. Will wait for follow up

## 2021-01-13 NOTE — Progress Notes (Signed)
Occupational Therapy Session Note  Patient Details  Name: Alicia Guerrero MRN: [id number] Date of Birth: 18-Feb-1935  Today's Date: 01/13/2021 OT Individual Time: 1300-1340 OT Individual Time Calculation (min): 40 min    Short Term Goals: Week 4:  OT Short Term Goal 1 (Week 4): Continue working on established LTGs set at min assist overall.  Skilled Therapeutic Interventions/Progress Updates:    Pt received in TIS w/c with no c/o pain, agreeable to OT session. She was taken to the ADL apt via w/c. Sit > stand from TiS with min A. She completed a functional reaching task with the LUE with max facilitation and then with the RUE while OT facilitated LUE weightbearing through the counter. Pt able to remain standing with CGA overall. She then completed forward weightbearing activity, "washing" a window with a towel to challenge wrist extension/stability, hand extension with weightbearing, and shoulder flexion. Mod facilitation required with cueing for reducing compensatory shoulder elevation. Tactile cueing required for scapular depression. She returned to her room and completed a stand pivot transfer back to bed with min A using the RW. Cueing required for RW management. She was left supine with all needs met, bed alarm set.   Therapy Documentation Precautions:  Precautions Precautions: Fall, Other (comment) Precaution Comments: L side hemiparesis, left neglect, SBP goal 120-140 range Restrictions Weight Bearing Restrictions: No  Therapy/Group: Individual Therapy  Curtis Sites 01/13/2021, 6:11 AM

## 2021-01-13 NOTE — Progress Notes (Signed)
Occupational Therapy Session Note  Patient Details  Name: Alicia Guerrero MRN: [id number] Date of Birth: [birth year]  Today's Date: 01/13/2021 OT Individual Time: 7564-3329 OT Individual Time Calculation (min): 55 min    Short Term Goals: Week 4:  OT Short Term Goal 1 (Week 4): Continue working on established LTGs set at min assist overall.  Skilled Therapeutic Interventions/Progress Updates:    Pt worked on bathing and dressing sit to stand from the EOB during session.  Min assist for UB bathing with min guard for unsupported sitting balance.  She was able to complete UB dressing at max assist however, following hemi dressing techniques.  She completed LB bathing at min guard assist without standing to wash front and back peri area, since this was washed earlier with nursing after BM.  She needed mod assist sit to stand for donning pull up pants.  She was able to complete donning socks and shoes at mod assist as well to complete dressing.  Completed transfer stand pivot to the wheelchair to complete session with min assist.  Pt was left sitting with the call button and phone in reach and safety alarm in place.    Therapy Documentation Precautions:  Precautions Precautions: Fall, Other (comment) Precaution Comments: L side hemiparesis, left neglect, SBP goal 120-140 range Restrictions Weight Bearing Restrictions: No  Pain: Pain Assessment Pain Scale: 0-10 Pain Score: 0-No pain Faces Pain Scale: No hurt ADL: See Care Tool Section for some details of mobility and selfcare Other Treatments:     Therapy/Group: Individual Therapy  Majorie Santee OTR/L 01/13/2021, 12:09 PM

## 2021-01-13 NOTE — Progress Notes (Signed)
Patient ID: Alicia Guerrero, female   DOB: Mar 03, 1935, 85 y.o.   MRN: [id number]  Team Conference Report to Patient/Family  Team Conference discussion was reviewed with the patient and caregiver, including goals, any changes in plan of care and target discharge date.  Patient and caregiver express understanding and are in agreement.  The patient has a target discharge date of 01/19/21.  Sw spoke with patient son, provided conference updates. Patient son informed of all d/c recommendations: DME, HH, family education. Patient sons will be present for family edu.   Dyanne Iha 01/13/2021, 12:23 PM

## 2021-01-13 NOTE — Progress Notes (Signed)
Physical Therapy Session Note  Patient Details  Name: Alicia Guerrero MRN: [id number] Date of Birth: 1935/07/31  Today's Date: 01/13/2021 PT Individual Time: 1133-1203 PT Individual Time Calculation (min): 30 min   Short Term Goals: Week 4:  PT Short Term Goal 1 (Week 4): = to LTGs based on ELOS  Skilled Therapeutic Interventions/Progress Updates: Pt presented in TIS agreeable to therapy. Pt denies pain throughout session. Session focus on ambulation and standing balance. Pt taken to 4th floor rehab gym due to congested environment in [facility] hallway. Participated in gait training ~159f with RW and minA. Pt required mod/max multimodal cues for close proximity to RW and attempting to maintain midline in RW using white tab for visual reference. PTA had pt stop several times to reorient positioning in RW for best safety. Pt then participated in step forwards without AD but using PTA for support. Pt required cues for TKE for posterior leg as pt tended to maintain B knees slightly flexed. Pt also performed Sit to stand without AD with emphasis on achieving full erect posutre and TKE in B knees (pt was able to achieve but unable to maintain). Pt then ambulated ~174fwithout AD to return to TIBelspringPt required modA and cues for increased step length, looking forward, and erect posture. Pt transported back to room at end of session and remained in TIS with belt alarm on, call bell within reach and needs met.      Therapy Documentation Precautions:  Precautions Precautions: Fall, Other (comment) Precaution Comments: L side hemiparesis, left neglect, SBP goal 120-140 range Restrictions Weight Bearing Restrictions: No General:   Vital Signs: Therapy Vitals Temp: 97.9 F (36.6 C) Temp Source: Oral Pulse Rate: 65 Resp: 17 BP: 115/60 Patient Position (if appropriate): Sitting Oxygen Therapy SpO2: 97 % O2 Device: Room Air Pain:   Mobility:   Locomotion :    Trunk/Postural Assessment :     Balance:   Exercises:   Other Treatments:      Therapy/Group: Individual Therapy  Jarrid Lienhard 01/13/2021, 4:25 PM

## 2021-01-13 NOTE — Progress Notes (Signed)
Speech Language Pathology Weekly Progress and Session Note  Patient Details  Name: Alicia Guerrero MRN: [id number] Date of Birth: 04/23/1935  Beginning of progress report period: January 06, 2021 End of progress report period: January 13, 2021  Today's Date: 01/13/2021 SLP Individual Time: 1324-4010 SLP Individual Time Calculation (min): 45 min  Short Term Goals: Week 3: SLP Short Term Goal 1 (Week 3): Patient will use external aids to orient to person/place/time/situation with sup A verbal cues SLP Short Term Goal 1 - Progress (Week 3): Met SLP Short Term Goal 2 (Week 3): Patient will demonstrate selective attention by participating in functional tasks within a mildly distracting environment with only min A cues to redirect SLP Short Term Goal 2 - Progress (Week 3): Met SLP Short Term Goal 3 (Week 3): Patient will mildly complex problem solving with sup A verbal cues SLP Short Term Goal 3 - Progress (Week 3): Progressing toward goal SLP Short Term Goal 4 (Week 3): Patient will consume current diet without overt s/sx of aspiration and supervision assist for implementation of safe swallowing strategies SLP Short Term Goal 4 - Progress (Week 3): Met  New Short Term Goals: Week 4: SLP Short Term Goal 1 (Week 4): STG=LTG due to ELOS  Weekly Progress Updates: Patient has made functional gains and has met 3 of 4 STGs this reporting period and is steadily progressing toward STG she did not meet. Currently, patient demonstrates improved orientation with use of external aids, attention, and awareness requiring overall supervision-to-min A  cues to complete functional and complex tasks accurately and safely. Patient is currently consuming a dysphagia 2 diet and nectar thick liquids with sup A cues for implementing compensatory swallowing strategies. Patient is scheduled for MBS tomorrow (12/30) to further evaluate oropharyngeal swallow function to determine candidacy for possible diet advancement.  Patient and family education is ongoing. Patient would benefit from continued skilled SLP intervention to maximize cognitive and swallow functioning and overall functional independence prior to discharge. Pt is expected to continue to improve with cognition with ongoing skilled SLP intervention.     Intensity: Minumum of 1-2 x/day, 30 to 90 minutes Frequency: 3 to 5 out of 7 days Duration/Length of Stay: 1/4 Treatment/Interventions: Cognitive remediation/compensation;Cueing hierarchy;Dysphagia/aspiration precaution training;Environmental controls;Functional tasks;Patient/family education;Therapeutic Activities  Daily Session Skilled Therapeutic Interventions: Skilled ST treatment focused on cognitive and swallowing goals. Patient performed oral care with set-up A prior to consumption of therapeutic PO trials of thin liquids (water only). Patient consumed small single sips of water with effortful swallow with immediate cough during 1/10 trials, and delayed cough during one occasion. Recommend MBS to further evaluate oropharyngeal swallow function scheduled tomorrow 12/30 at 0930. Educated patient on MBS. Pt verbalized understanding and agreement to proceed. Patient was oriented to person/place/situation but continues to require sup A cues to orient to time using external aids. Initially reported year as "2002" then "2004." SLP then facilitated selective attention and problem solving skills with word scramble task with min A verbal cues to redirect attention secondary to external distractions. Patient required increased cueing toward end of session due to possible cognitive fatigue. Pt expressed "I feel like I can't think anymore." Patient was left in wheelchair with alarm activated and immediate needs within reach at end of session. Continue per current plan of care.     General    Pain Pain Assessment Pain Scale: 0-10 Pain Score: 0-No pain Faces Pain Scale: No hurt  Therapy/Group: Individual  Therapy  Patty Sermons 01/13/2021, 12:22 PM

## 2021-01-14 ENCOUNTER — Inpatient Hospital Stay (HOSPITAL_COMMUNITY): Payer: Medicare HMO

## 2021-01-14 LAB — GLUCOSE, CAPILLARY
Glucose-Capillary: 123 mg/dL — ABNORMAL HIGH (ref 70–99)
Glucose-Capillary: 148 mg/dL — ABNORMAL HIGH (ref 70–99)
Glucose-Capillary: 169 mg/dL — ABNORMAL HIGH (ref 70–99)
Glucose-Capillary: 137 mg/dL — ABNORMAL HIGH (ref 70–99)

## 2021-01-14 MED ORDER — GABAPENTIN 300 MG PO CAPS
300.0000 mg | ORAL_CAPSULE | Freq: Every day | ORAL | Status: DC
  Administered 2021-01-14 – 2021-01-16 (×3): 300 mg via ORAL
  Filled 2021-01-14 (×3): qty 1

## 2021-01-14 MED ORDER — MELATONIN 3 MG PO TABS
3.0000 mg | ORAL_TABLET | Freq: Every evening | ORAL | Status: DC | PRN
  Administered 2021-01-14: 3 mg via ORAL
  Filled 2021-01-14: qty 1

## 2021-01-14 NOTE — Progress Notes (Signed)
PROGRESS NOTE   Subjective/Complaints:  Pt reports has been awake since 2am- very sleepy, but willing to do therapy.  Hasn't been sleeping well since had stroke.  Also having more tingling/burning in L hand since stroke- really bothered her last night.  Woke her from sleep.    ROS:   Pt denies SOB, abd pain, CP, N/V/C/D, and vision changes     Objective:   No results found. No results for input(s): WBC, HGB, HCT, PLT in the last 72 hours.   No results for input(s): NA, K, CL, CO2, GLUCOSE, BUN, CREATININE, CALCIUM in the last 72 hours.    Intake/Output Summary (Last 24 hours) at 01/14/2021 1300 Last data filed at 01/14/2021 0847 Gross per 24 hour  Intake 355 ml  Output --  Net 355 ml          Physical Exam: Vital Signs Blood pressure 125/78, pulse 66, temperature 98.2 F (36.8 C), temperature source Oral, resp. rate 17, height 5\' 3"  (1.6 m), weight 63.4 kg, SpO2 95 %.   General: awake, alert, appropriate, sitting up in bed in dark; NAD HENT: conjugate gaze; oropharynx moist CV: regular rate; no JVD Pulmonary: CTA B/L; no W/R/R- good air movement GI: soft, NT, ND, (+)BS Psychiatric: appropriate;flat affect Neurological: delayed responses; alert ; MAS of 1- 1+ in LUE- esp at shoulder/elbow- slight increase in finger movement- no change today- rubbing L hand "due ot pain".  Ext: no clubbing, cyanosis, or edema Psych: pleasant and cooperative  Skin: No evidence of breakdown, no evidence of rash Neurologic: Oriented to person , hospital, month, stroke.  Cranial nerves II through XII intact, motor strength is 5/5 in Right deltoid, bicep, tricep, grip, hip flexor, knee extensors, ankle dorsiflexor and plantar flexor, 2- Left delt bic , tri, 2 grip, 3-4/5 L HF, KE ADF, Tone 1/4 left wrist finger flexors Sensory exam normal sensation to light touch and proprioception in bilateral upper and lower  extremities  Musculoskeletal: Full range of motion in all 4 extremities. No joint swelling    Assessment/Plan: 1. Functional deficits which require 3+ hours per day of interdisciplinary therapy in a comprehensive inpatient rehab setting. Physiatrist is providing close team supervision and 24 hour management of active medical problems listed below. Physiatrist and rehab team continue to assess barriers to discharge/monitor patient progress toward functional and medical goals  Care Tool:  Bathing    Body parts bathed by patient: Chest, Abdomen, Right upper leg, Left upper leg, Face, Left arm, Left lower leg, Right lower leg   Body parts bathed by helper: Right arm Body parts n/a: Front perineal area, Buttocks   Bathing assist Assist Level: Minimal Assistance - Patient > 75%     Upper Body Dressing/Undressing Upper body dressing   What is the patient wearing?: Pull over shirt    Upper body assist Assist Level: Maximal Assistance - Patient 25 - 49%    Lower Body Dressing/Undressing Lower body dressing      What is the patient wearing?: Pants     Lower body assist Assist for lower body dressing: Moderate Assistance - Patient 50 - 74%     Chartered loss adjuster  assist Assist for toileting: Moderate Assistance - Patient 50 - 74%     Transfers Chair/bed transfer  Transfers assist  Chair/bed transfer activity did not occur: Safety/medical concerns  Chair/bed transfer assist level: Minimal Assistance - Patient > 75% Chair/bed transfer assistive device: Armrests, Programmer, multimedia   Ambulation assist   Ambulation activity did not occur: Safety/medical concerns  Assist level: Minimal Assistance - Patient > 75% Assistive device: Walker-rolling Max distance: 130 ft   Walk 10 feet activity   Assist  Walk 10 feet activity did not occur: Safety/medical concerns  Assist level: Contact Guard/Touching assist Assistive device: Walker-rolling    Walk 50 feet activity   Assist Walk 50 feet with 2 turns activity did not occur: Safety/medical concerns  Assist level: Minimal Assistance - Patient > 75% Assistive device: Walker-rolling    Walk 150 feet activity   Assist Walk 150 feet activity did not occur: Safety/medical concerns         Walk 10 feet on uneven surface  activity   Assist Walk 10 feet on uneven surfaces activity did not occur: Safety/medical concerns         Wheelchair     Assist Is the patient using a wheelchair?: No             Wheelchair 50 feet with 2 turns activity    Assist            Wheelchair 150 feet activity     Assist          Blood pressure 125/78, pulse 66, temperature 98.2 F (36.8 C), temperature source Oral, resp. rate 17, height 5\' 3"  (1.6 m), weight 63.4 kg, SpO2 95 %.  Medical Problem List and Plan: 1. Functional deficits secondary to posterior circulation stroke             -patient may shower             -ELOS/Goals: 10-14 days S-         -Continue CIR- PT, OT and SLP; MBS today? 2.  Antithrombotics: -DVT/anticoagulation:  Pharmaceutical: Other (comment)--Eliquis             -antiplatelet therapy: ASA added  3. Pain Management: Nerve pain in L hand Tylenol prn.   12/30- will add gabapentin 300 mg QHS for nerve pain/also help sleep some- will monitor closely due to age 28. Mood: LCSW to follow for evaluation and support.              -antipsychotic agents: N/A 5. Neuropsych: This patient is not fully capable of making decisions on capable own behalf. 6. Skin/Wound Care: Routine pressure relief measures 7. Fluids/Electrolytes/Nutrition:  encourage PO  12/26- labs look great- except Glucose of 119- everything is normal range- I personally assessed 8. CAF/ICM s/p ICD: Monitor HR TID. Continue Eliquis and bisoprolol.             --monitor for symptoms with increase in activity.   12/24- HR controlled- con't regimen 9. H/o CVA with L-HP: Was  independent without AD PTA. 10. HTN: Monitor BP TID--SBP goal 120-140 range per neur due to MCA/PCA stenosis             --continue bisoprolol and Avapro. Increase amlodipine to 10mg .   12/30- BP controlled- con't regimen Vitals:   01/14/21 0432 01/14/21 1257  BP: 122/78 125/78  Pulse: 66 66  Resp:  17  Temp: 97.8 F (36.6 C) 98.2 F (36.8 C)  SpO2: 94% 95%  11. Dysphagia: Continue D1, nectar liquids.  .             -12/19 encouraged her to push through diet until advanced  12/27- up to D2 nectar thick liquids. MBS on Friday  12/30- pending new MBS? Will check? 12. T2DM: Hgb A1C- 6.0. Continue to hold metformin             --monitor BS ac/hs and use SSI for now.  CBG (last 3)  Recent Labs    01/13/21 2109 01/14/21 0544 01/14/21 1111  GLUCAP 151* 123* 148*   12/30- BG's controlled- con't regimen 13. Chronic intermittent insomnia: Will resume Melatonin (used 10 mg prn PTA) 14. Chronic congeseted cough: For 5-6 years and felt to be due to allergies. --added Claritin.    15.  Lethargy/confusion- resolved   labs ok, afebrile, no sedating meds   UCx no growth 16. Loose stools  12/27- not on anything for bowels- will monitor  12/29- LBM this AM- will monitor 17. Insomnia  12/30- added gabapentin 300 mg QHS for pain/sleep and also changed trazodone prn to melatonin 3 mg QHS prn for sleep.   LOS: 23 days A FACE TO FACE EVALUATION WAS PERFORMED  Arnet Hofferber 01/14/2021, 1:00 PM

## 2021-01-14 NOTE — Progress Notes (Signed)
Occupational Therapy Session Note  Patient Details  Name: Alicia Guerrero MRN: [id number] Date of Birth: November 30, 1935  Today's Date: 01/14/2021 OT Individual Time: 0800-0900 OT Individual Time Calculation (min): 60 min    Short Term Goals: Week 4:  OT Short Term Goal 1 (Week 4): Continue working on established LTGs set at min assist overall.  Skilled Therapeutic Interventions/Progress Updates:    Session 1: (0800-0900)  Pt in bed to start with focus of session on eating breakfast.  No report of pain during session.  She was able to transfer to the EOB with min assist and then stand pivot to the wheelchair.  She was able to work on self feeding using the RUE for 75% of meal with supervision and red foam grip on utensil.  She was able to complete eating using the LUE with mod assist to scoop and bring food to mouth with the spoon.  Mod hand over hand as well to hold small salt and pepper containers when attempting to open them.  Once breakfast was complete, she was able to then stand with the RW at min guard assist and mod instructional cueing for hand placement.  She then needed min assist for ambulation over to the sink with mod instructional cueing to stay inside of the walker, as she would get outside of it and to the side when turning toward the sink.  She was able to stand with min assist to complete washing her face, brushing her hair, and rinsing her mouth.  She then returned over to the wheelchair at min assist where she was left with the call button and phone in reach as well as safety alarm belt in place.    Session 2: (3300-7622)  Pt in wheelchair to start with some slight pain in the left thumb noted during session.  She responded good to repositioning of the hand.  Took her over to the 5th floor gym where she worked on [employer]  She was able to complete LUE AAROM in small movements with holding onto a small dowel rod.  Ace bandage was used to help sustain grip in the left hand  while pushing dowel to targets at different height.  Min to mod assist needed to avoid right trunk lean, shoulder hike on the left side, and left cervical flexion.  She completed several reps ranging from 5-10 with assist from therapist.  Removed bandage and had her complete single arm reach to knee level to pick up and place the small dowel in therapist's hand.  Good digit extension noted at approximately 70% with flexion at 60-70 % as well.  Finished session with return to the room and pt left sitting up in the tilt in space wheelchair with the call button and phone in reach.    Therapy Documentation Precautions:  Precautions Precautions: Fall, Other (comment) Precaution Comments: L side hemiparesis, left neglect, SBP goal 120-140 range Restrictions Weight Bearing Restrictions: No   Pain: Pain Assessment Pain Scale: Faces Pain Score: 0-No pain ADL: See Care Tool Section for some details of mobility and selfcare   Therapy/Group: Individual Therapy  Frieda Arnall OTR/L 01/14/2021, 11:34 AM

## 2021-01-14 NOTE — Progress Notes (Signed)
Physical Therapy Session Note  Patient Details  Name: Alicia Guerrero MRN: [id number] Date of Birth: 29-Sep-1935  Today's Date: 01/14/2021 PT Individual Time: 9024-0973 PT Individual Time Calculation (min): 56 min   Short Term Goals: Week 1:  PT Short Term Goal 1 (Week 1): Pt will perform supine<>sit with max assist PT Short Term Goal 1 - Progress (Week 1): Met PT Short Term Goal 2 (Week 1): Pt will tolerate upright sitting on EOB/EOM for at least 2 minutes with no more than mod assist PT Short Term Goal 2 - Progress (Week 1): Met PT Short Term Goal 3 (Week 1): Pt will perform bed<>chair transfers with +2 max assist using  LRAD PT Short Term Goal 3 - Progress (Week 1): Met PT Short Term Goal 4 (Week 1): Pt will perform sit<>stands using LRAD with +2 max assist Week 2:  PT Short Term Goal 1 (Week 2): Pt will maintain sitting blance w/min assist during functional activity x 5 min PT Short Term Goal 1 - Progress (Week 2): Met PT Short Term Goal 2 (Week 2): Pt will perform sit to stand to AD w/min assist consistently PT Short Term Goal 2 - Progress (Week 2): Progressing toward goal PT Short Term Goal 3 (Week 2): Pt will ambulate 158f w/LRAD w/min assist PT Short Term Goal 3 - Progress (Week 2): Progressing toward goal Week 3:  PT Short Term Goal 1 (Week 3): Pt will perform supine<>sit with min assist consistently PT Short Term Goal 1 - Progress (Week 3): Met PT Short Term Goal 2 (Week 3): Pt will perform sit<>stand using LRAD with min assist consistently PT Short Term Goal 2 - Progress (Week 3): Met PT Short Term Goal 3 (Week 3): Pt will perform bed<>chair transfers using LRAD with min assist consistently PT Short Term Goal 3 - Progress (Week 3): Progressing toward goal PT Short Term Goal 4 (Week 3): Pt will ambulate at least 546fusing LRAD with min assist consistently (including some turning) PT Short Term Goal 4 - Progress (Week 3): Progressing toward goal  Skilled Therapeutic  Interventions/Progress Updates:    pt received in bed and agreeable to therapy. Pt reports pain in her L hand, Rest breaks, positioning, and distraction provided throughout session as pain intervention. Supine>sit with min and VC for use of flexion/rotation technique. Sit to stand with CGA and VC for AD management. Pt transported to therapy gym for time management and energy conservation. Pt directed in standing reaching task, moving cone with L hand for improved LUE use and balance with RW.  Transitioned to standing balance task with taps on airex progressing to elevated lunges on airex for improved proprioception and LE strength. Step ups on airex pad for dynamic balance and LE strength. Demoes difficulty shifting weight forward over RLE when stepping up with that side. Pt ambulated x 70 ft with RW and min A for managing RW, and returned to TIS w/c. Pt was unable to maintain RW proximity with this device and likely needs youth walker when one becomes available. Pt demoed flexed trunk posture, difficulty steering RW, and catching L foot which worsened with fatigue. Pt was left with all needs in reach and alarm active.   Therapy Documentation Precautions:  Precautions Precautions: Fall, Other (comment) Precaution Comments: L side hemiparesis, left neglect, SBP goal 120-140 range Restrictions Weight Bearing Restrictions: No General:   Vital Signs: Therapy Vitals Temp: 98.2 F (36.8 C) Temp Source: Oral Pulse Rate: 66 Resp: 17 BP: 125/78 Patient Position (  if appropriate): Lying Oxygen Therapy SpO2: 95 % Pain:   Mobility:   Locomotion :    Trunk/Postural Assessment :    Balance:   Exercises:   Other Treatments:      Therapy/Group: Individual Therapy  Mickel Fuchs 01/14/2021, 2:23 PM

## 2021-01-14 NOTE — Progress Notes (Signed)
Modified Barium Swallow Progress Note  Patient Details  Name: Alicia Guerrero MRN: [id number] Date of Birth: May 04, 1935  Today's Date: 01/14/2021  Modified Barium Swallow completed.  Full report located under Chart Review in the Imaging Section.  Brief recommendations include the following:  Clinical Impression  Patient presents with improvement in swallow funciton as compared to MBS completed on 12/2. During oral phase, she exhibited mild delay in mastication of dysphagia 3 solids, reduced anterior to posterior propulsion of all boluses, leading to premature spillage into vallecular sinus with liquids. During pharyngeal phase, patient exhibited swallow initiation delay to level of vallecular sinus with puree and dysphagia 3 solids, and delay at level of pyriform sinus with thin liquids. She exhibited two instances of trace penetration above cords with thin liquid barium that did not immedialely clear (PAS 3) but which did not result in aspiration. Dys 3 solid (cereal bar) resulted in mild-mod amount of vallecular sinus residuals. She then exhibited an instance of sensed trace to mild amount aspiration of thin liquid barium which appeared to be impacted by vallecular sinus residuals of solids. She did not exhibit any instances of aspiration of thin liquids in absence of solid food textures. SLP is recommending to upgrade patient's liquids from nectar thick to thin but to continue with Dys 1 (puree) solids.   Swallow Evaluation Recommendations       SLP Diet Recommendations: Dysphagia 1 (Puree) solids;Thin liquid   Liquid Administration via: Cup;Straw   Medication Administration: Crushed with puree   Supervision: Full supervision/cueing for compensatory strategies;Staff to assist with self feeding   Compensations: Slow rate;Small sips/bites;Minimize environmental distractions;Lingual sweep for clearance of pocketing;Multiple dry swallows after each bite/sip;Effortful swallow   Postural  Changes: Seated upright at 90 degrees   Oral Care Recommendations: Oral care BID      Sonia Baller, MA, CCC-SLP Speech Therapy

## 2021-01-15 LAB — GLUCOSE, CAPILLARY
Glucose-Capillary: 133 mg/dL — ABNORMAL HIGH (ref 70–99)
Glucose-Capillary: 114 mg/dL — ABNORMAL HIGH (ref 70–99)
Glucose-Capillary: 112 mg/dL — ABNORMAL HIGH (ref 70–99)
Glucose-Capillary: 165 mg/dL — ABNORMAL HIGH (ref 70–99)

## 2021-01-15 MED ORDER — TRAZODONE HCL 50 MG PO TABS
50.0000 mg | ORAL_TABLET | Freq: Every day | ORAL | Status: DC
  Administered 2021-01-15 – 2021-01-18 (×4): 50 mg via ORAL
  Filled 2021-01-15 (×4): qty 1

## 2021-01-16 LAB — GLUCOSE, CAPILLARY
Glucose-Capillary: 128 mg/dL — ABNORMAL HIGH (ref 70–99)
Glucose-Capillary: 98 mg/dL (ref 70–99)
Glucose-Capillary: 112 mg/dL — ABNORMAL HIGH (ref 70–99)
Glucose-Capillary: 176 mg/dL — ABNORMAL HIGH (ref 70–99)

## 2021-01-16 NOTE — Progress Notes (Signed)
Occupational Therapy Session Note  Patient Details  Name: Alicia Guerrero MRN: [id number] Date of Birth: 05/01/1935  Today's Date: 01/16/2021 OT Individual Time: 6606-3016 OT Individual Time Calculation (min): 53 min    Short Term Goals: Week 4:  OT Short Term Goal 1 (Week 4): Continue working on established LTGs set at min assist overall.   Skilled Therapeutic Interventions/Progress Updates:    Pt greeted at time of session semirecline in bed resting agreeable to OT session, no pain throughout. Pt wanting to wash up and change clothes this AM. Upon supine > sit with supervision, pt with posterior lean and aware of this but needing cues to correct. Needing to toilet at this time and ambulated with RW and L hand splint with Min/Mod A to bathroom for toilet transfer. (+) void of bladder, and to perform hygiene in standing with Min A for standing balance. As pt was already in bathroom, agreeable to sponge bathe from commode level at this time and did so with Min A overall, able to wash periarea and buttocks in standing. UB dress Mod A overall and applyig deodorant same manner. LB dress for pants with Mod A as well to thread. Cues for hemitechnique as pt knows she is to thread L side first but frequently did the right first until cued. Hand over hand for NMR using LUE for bathing and dressing tasks. Ambulated back to chair with Mod A with RW and hand splint. Set up for breakfast and provided full supervision as able, hand off to NT. Set up in [place] chair with alarm on call bell in reach.   Therapy Documentation Precautions:  Precautions Precautions: Fall, Other (comment) Precaution Comments: L side hemiparesis, left neglect, SBP goal 120-140 range Restrictions Weight Bearing Restrictions: No     Therapy/Group: Individual Therapy  Viona Gilmore 01/16/2021, 7:15 AM

## 2021-01-16 NOTE — Progress Notes (Signed)
Occupational Therapy Session Note  Patient Details  Name: Alicia Guerrero MRN: [id number] Date of Birth: 1935/10/01  Today's Date: 01/16/2021 OT Individual Time: 9798-9211 OT Individual Time Calculation (min): 57 min    Short Term Goals: Week 4:  OT Short Term Goal 1 (Week 4): Continue working on established LTGs set at min assist overall.  Skilled Therapeutic Interventions/Progress Updates:  Pt greeted seated on toilet with NT present, pt agreeable to OT intervention. Session focus on BADL reeducation, functional mobility, dynamic standing balance, increasing activity tolerance and decreasing overall caregiver burden. Pt completed toileting with MIN A needing assist to pull pants up to waist line on L side.  Pt able to ambulate to sink with rw and MIN A for hand hygiene. Pt completed hand hygiene standing at sink with MIN A. Pt transported to gym on [facility] with total A in w/c. Worked on functional grasp and release with LUE with pt instructed to reach for small PVC pipe pieces, pt mostly using hand over hand assist to reach for pipes but able to use LUE for gross grasp with an emphasis on digit flexion/extension and supination/pronation of wrist. Graded tasj up and Worked on grasping clothespins with LUE with pt able to pronate wrist and grasp clothespins with first and second finger with elbow supported, graded task up and had pt reach for clothespins in standing positioned on theraband hanging on wall. Pt needed MIN A for sit<>stand and for  standing balance. Worked on opening ADL items in sitting such as soaps with a focus on grasping soaps with L hand and R hand completing precision task. Worked on functional mobility and increasing activity tolerance with pt completed ~ 100 ft of functional mobility with rw and MIN A needing cues for RW mgmt. Pt required seated rest break in between 48ft trials. VSS on RA. Pt transported back to room with total A from w/c. pt left seated in w/c with alarm belt  activated and all needs within reach.                     Therapy Documentation Precautions:  Precautions Precautions: Fall, Other (comment) Precaution Comments: L side hemiparesis, left neglect, SBP goal 120-140 range Restrictions Weight Bearing Restrictions: No   Pain: no pain reported during session   Therapy/Group: Individual Therapy  Precious Haws 01/16/2021, 3:29 PM

## 2021-01-17 DIAGNOSIS — I639 Cerebral infarction, unspecified: Secondary | ICD-10-CM

## 2021-01-17 DIAGNOSIS — I63511 Cerebral infarction due to unspecified occlusion or stenosis of right middle cerebral artery: Secondary | ICD-10-CM | POA: Diagnosis not present

## 2021-01-17 DIAGNOSIS — G450 Vertebro-basilar artery syndrome: Secondary | ICD-10-CM | POA: Diagnosis not present

## 2021-01-17 LAB — GLUCOSE, CAPILLARY
Glucose-Capillary: 119 mg/dL — ABNORMAL HIGH (ref 70–99)
Glucose-Capillary: 124 mg/dL — ABNORMAL HIGH (ref 70–99)
Glucose-Capillary: 127 mg/dL — ABNORMAL HIGH (ref 70–99)
Glucose-Capillary: 140 mg/dL — ABNORMAL HIGH (ref 70–99)

## 2021-01-17 LAB — BASIC METABOLIC PANEL
Anion gap: 11 (ref 5–15)
BUN: 23 mg/dL (ref 8–23)
CO2: 24 mmol/L (ref 22–32)
Calcium: 9.5 mg/dL (ref 8.9–10.3)
Chloride: 103 mmol/L (ref 98–111)
Creatinine, Ser: 0.87 mg/dL (ref 0.44–1.00)
GFR, Estimated: >60 mL/min (ref >60)
Glucose, Bld: 119 mg/dL — ABNORMAL HIGH (ref 70–99)
Potassium: 4.1 mmol/L (ref 3.5–5.1)
Sodium: 138 mmol/L (ref 135–145)

## 2021-01-17 LAB — CBC
HCT: 41.1 % (ref 36.0–46.0)
Hemoglobin: 13.2 g/dL (ref 12.0–15.0)
MCH: 26.8 pg (ref 26.0–34.0)
MCHC: 32.1 g/dL (ref 30.0–36.0)
MCV: 83.4 fL (ref 80.0–100.0)
Platelets: 283 10*3/uL (ref 150–400)
RBC: 4.93 MIL/uL (ref 3.87–5.11)
RDW: 13.4 % (ref 11.5–15.5)
WBC: 7.9 10*3/uL (ref 4.0–10.5)
nRBC: 0 % (ref 0.0–0.2)

## 2021-01-17 MED ORDER — METFORMIN HCL 500 MG PO TABS: 500.0000 mg | ORAL_TABLET | Freq: Every day | ORAL | Status: DC

## 2021-01-17 NOTE — Progress Notes (Signed)
PROGRESS NOTE   Subjective/Complaints:  Left hand pain started after WB exercise in therapy 2 d ago, some improvement today ,no falls since PTA    ROS:   Pt denies SOB, abd pain, CP, N/V/C/D, and vision changes     Objective:   No results found. Recent Labs    01/17/21 0558  WBC 7.9  HGB 13.2  HCT 41.1  PLT 283     Recent Labs    01/17/21 0558  NA 138  K 4.1  CL 103  CO2 24  GLUCOSE 119*  BUN 23  CREATININE 0.87  CALCIUM 9.5      Intake/Output Summary (Last 24 hours) at 01/17/2021 1210 Last data filed at 01/16/2021 1845 Gross per 24 hour  Intake 777 ml  Output --  Net 777 ml           Physical Exam: Vital Signs Blood pressure (!) 147/70, pulse 66, temperature (!) 97.4 F (36.3 C), temperature source Oral, resp. rate 18, height 5\' 3"  (1.6 m), weight 63.4 kg, SpO2 94 %.   General: No acute distress Mood and affect are appropriate Heart: Irregular Regular rate and rhythm no rubs murmurs or extra sounds Lungs: Clear to auscultation, breathing unlabored, no rales or wheezes Abdomen: Positive bowel sounds, soft nontender to palpation, nondistended Extremities: No clubbing, cyanosis, or edema Skin: No evidence of breakdown, no evidence of rash   Ext: no clubbing, cyanosis, or edema Psych: pleasant and cooperative  Skin: No evidence of breakdown, no evidence of rash Neurologic: Oriented to person , hospital, month, stroke.  Cranial nerves II through XII intact, motor strength is 5/5 in Right deltoid, bicep, tricep, grip, hip flexor, knee extensors, ankle dorsiflexor and plantar flexor, 2- Left delt bic , tri, 2 grip, 3-4/5 L HF, KE ADF, Tone 1/4 left wrist finger flexors Sensory exam normal sensation to light touch and proprioception in bilateral upper and lower extremities  Musculoskeletal: Full range of motion in all 4 extremities. No joint swelling    Assessment/Plan: 1. Functional  deficits which require 3+ hours per day of interdisciplinary therapy in a comprehensive inpatient rehab setting. Physiatrist is providing close team supervision and 24 hour management of active medical problems listed below. Physiatrist and rehab team continue to assess barriers to discharge/monitor patient progress toward functional and medical goals  Care Tool:  Bathing    Body parts bathed by patient: Chest, Abdomen, Right upper leg, Left upper leg, Face, Left arm, Left lower leg, Right lower leg, Front perineal area, Buttocks   Body parts bathed by helper: Right arm Body parts n/a: Front perineal area, Buttocks   Bathing assist Assist Level: Minimal Assistance - Patient > 75%     Upper Body Dressing/Undressing Upper body dressing   What is the patient wearing?: Pull over shirt    Upper body assist Assist Level: Moderate Assistance - Patient 50 - 74%    Lower Body Dressing/Undressing Lower body dressing      What is the patient wearing?: Pants     Lower body assist Assist for lower body dressing: Moderate Assistance - Patient 50 - 74%     Toileting Toileting    Toileting assist  Assist for toileting: Minimal Assistance - Patient > 75%     Transfers Chair/bed transfer  Transfers assist  Chair/bed transfer activity did not occur: Safety/medical concerns  Chair/bed transfer assist level: Minimal Assistance - Patient > 75% Chair/bed transfer assistive device: Armrests, Programmer, multimedia   Ambulation assist   Ambulation activity did not occur: Safety/medical concerns  Assist level: Minimal Assistance - Patient > 75% Assistive device: Walker-rolling Max distance: 130 ft   Walk 10 feet activity   Assist  Walk 10 feet activity did not occur: Safety/medical concerns  Assist level: Contact Guard/Touching assist Assistive device: Walker-rolling   Walk 50 feet activity   Assist Walk 50 feet with 2 turns activity did not occur: Safety/medical  concerns  Assist level: Minimal Assistance - Patient > 75% Assistive device: Walker-rolling    Walk 150 feet activity   Assist Walk 150 feet activity did not occur: Safety/medical concerns         Walk 10 feet on uneven surface  activity   Assist Walk 10 feet on uneven surfaces activity did not occur: Safety/medical concerns         Wheelchair     Assist Is the patient using a wheelchair?: No             Wheelchair 50 feet with 2 turns activity    Assist            Wheelchair 150 feet activity     Assist          Blood pressure (!) 147/70, pulse 66, temperature (!) 97.4 F (36.3 C), temperature source Oral, resp. rate 18, height 5\' 3"  (1.6 m), weight 63.4 kg, SpO2 94 %.  Medical Problem List and Plan: 1. Functional deficits secondary to posterior circulation stroke             -patient may shower             -ELOS/Goals: d/c 1/4 S- Family requesting Rx tomorrow vs day of discharge         -Continue CIR- PT, OT and SLP; MBS today? 2.  Antithrombotics: -DVT/anticoagulation:  Pharmaceutical: Other (comment)--Eliquis             -antiplatelet therapy: ASA added  3. Pain Management: Nerve pain in L hand Tylenol prn.   12/30- will add gabapentin 300 mg QHS for nerve pain/also help sleep some- will monitor closely due to age 58. Mood: LCSW to follow for evaluation and support.              -antipsychotic agents: N/A 5. Neuropsych: This patient is not fully capable of making decisions on capable own behalf. 6. Skin/Wound Care: Routine pressure relief measures 7. Fluids/Electrolytes/Nutrition:  encourage PO  12/26- labs look great- except Glucose of 119- everything is normal range- I personally assessed 8. CAF/ICM s/p ICD: Monitor HR TID. Continue Eliquis and bisoprolol.             --monitor for symptoms with increase in activity.   12/24- HR controlled- con't regimen 9. H/o CVA with L-HP: Was independent without AD PTA. 10. HTN: Monitor BP  TID--SBP goal 120-140 range per neur due to MCA/PCA stenosis             --continue bisoprolol and Avapro. Increase amlodipine to 10mg .   12/30- BP controlled- con't regimen Vitals:   01/16/21 2014 01/17/21 0551  BP: (!) 118/51 (!) 147/70  Pulse: 67 66  Resp: 18 18  Temp: 98.3 F (36.8  C) (!) 97.4 F (36.3 C)  SpO2: 96% 94%    11. Dysphagia: Continue D1, nectar liquids.  .             -12/19 encouraged her to push through diet until advanced  12/27- up to D2 nectar thick liquids. MBS on Friday  12/30- pending new MBS? Will check? 12. T2DM: Hgb A1C- 6.0. Continue to hold metformin             --monitor BS ac/hs and use SSI for now.  CBG (last 3)  Recent Labs    01/16/21 1706 01/16/21 2109 01/17/21 0602  GLUCAP 112* 176* 119*    01/17/21 controlled , D/C SSI on discharge, will restart metformin home dose  After that pt less compliant with diabetic diet at home , discussed with sons 67. Chronic intermittent insomnia: Will resume Melatonin (used 10 mg prn PTA) 14. Chronic congeseted cough: For 5-6 years and felt to be due to allergies. --added Claritin.    15.  Lethargy/confusion- resolved   labs ok, afebrile, no sedating meds   UCx no growth 16. Loose stools  Last BM 12/31 - now constipated  17. Insomnia  12/30- added gabapentin 300 mg QHS for pain/sleep and also changed trazodone prn to melatonin 3 mg QHS prn for sleep.   LOS: 26 days A FACE TO FACE EVALUATION WAS PERFORMED  Charlett Blake 01/17/2021, 12:10 PM

## 2021-01-17 NOTE — Progress Notes (Signed)
Patient ID: Alicia Guerrero, female   DOB: 04/09/1935, 86 y.o.   MRN: [id number]  This SW covering for primary SW, Erlene Quan.   SW ordered standard w/c with [employer] [facility] via parachute.   Loralee Pacas, MSW, [place] Office: [phone] Cell: [phone] Fax: [phone]

## 2021-01-17 NOTE — Progress Notes (Signed)
Physical Therapy Session Note  Patient Details  Name: Alicia Guerrero MRN: [id number] Date of Birth: 13-Jun-1935  Today's Date: 01/17/2021 PT Individual Time: 8841-6606 PT Individual Time Calculation (min): 28 min   Short Term Goals: Week 1:  PT Short Term Goal 1 (Week 1): Pt will perform supine<>sit with max assist PT Short Term Goal 1 - Progress (Week 1): Met PT Short Term Goal 2 (Week 1): Pt will tolerate upright sitting on EOB/EOM for at least 2 minutes with no more than mod assist PT Short Term Goal 2 - Progress (Week 1): Met PT Short Term Goal 3 (Week 1): Pt will perform bed<>chair transfers with +2 max assist using  LRAD PT Short Term Goal 3 - Progress (Week 1): Met PT Short Term Goal 4 (Week 1): Pt will perform sit<>stands using LRAD with +2 max assist Week 2:  PT Short Term Goal 1 (Week 2): Pt will maintain sitting blance w/min assist during functional activity x 5 min PT Short Term Goal 1 - Progress (Week 2): Met PT Short Term Goal 2 (Week 2): Pt will perform sit to stand to AD w/min assist consistently PT Short Term Goal 2 - Progress (Week 2): Progressing toward goal PT Short Term Goal 3 (Week 2): Pt will ambulate 118f w/LRAD w/min assist PT Short Term Goal 3 - Progress (Week 2): Progressing toward goal Week 3:  PT Short Term Goal 1 (Week 3): Pt will perform supine<>sit with min assist consistently PT Short Term Goal 1 - Progress (Week 3): Met PT Short Term Goal 2 (Week 3): Pt will perform sit<>stand using LRAD with min assist consistently PT Short Term Goal 2 - Progress (Week 3): Met PT Short Term Goal 3 (Week 3): Pt will perform bed<>chair transfers using LRAD with min assist consistently PT Short Term Goal 3 - Progress (Week 3): Progressing toward goal PT Short Term Goal 4 (Week 3): Pt will ambulate at least 541fusing LRAD with min assist consistently (including some turning) PT Short Term Goal 4 - Progress (Week 3): Progressing toward goal  Skilled Therapeutic  Interventions/Progress Updates:  Pt received supine in bed, denied pain and was agreeable to PT. Emphasis of session on L NMR and gait training. Pt performed supine <> w/mod A for trunk support and heavy posterior lean correction. Pt required min-mod A to sit EOB due to frequently falling backward. Donned shoes at EOB w/max A and pt performed sit <>stand pivot from EOB to TIS chair w/mod A due to poor use of AD and significant posterolateral lean to R side while attempting to stand. Min verbal cues provided for proper hand placement. [place] for time management and [facility]/g[facility] training. Pt performed sit <>stand from TIS w/min A to RW, noted much better hand placement. Pt ambulated >150' w/RW, noted poor management of AD, shuffling gait which increased as she fatigued, narrow and kyphotic posture. Mod verbal cues to maintain close distance to orange band tied around RW and encouraged pt to stop when she could hear her feet sliding on the floor. Pt transported back to room w/total A for time management and was left seated in chair, all needs in reach.   Therapy Documentation Precautions:  Precautions Precautions: Fall, Other (comment) Precaution Comments: L side hemiparesis, left neglect, SBP goal 120-140 range Restrictions Weight Bearing Restrictions: No   Therapy/Group: Individual Therapy JaCruzita Ledererlaster, PT, DPT  01/17/2021, 8:00 AM

## 2021-01-17 NOTE — Progress Notes (Signed)
Patient's daughter had family education teaching today with staff but did not show up.  This nurse inquired from patient if daughter will be coming and when. Patient stated he will call her tonight after daughter gets back home from work. This nurse requested patient to let staff know when he finds out.

## 2021-01-17 NOTE — Progress Notes (Signed)
Occupational Therapy Session Note  Patient Details  Name: Alicia Guerrero MRN: [id number] Date of Birth: 09-03-1935  Today's Date: 01/17/2021 OT Individual Time: 6384-6659 OT Individual Time Calculation (min): 56 min    Short Term Goals: Week 4:  OT Short Term Goal 1 (Week 4): Continue working on established LTGs set at min assist overall.  Skilled Therapeutic Interventions/Progress Updates:    Pt in tilt in space wheelchair to start session.  Her son's Jody and Dewayne were present for education.  Provided hands on education for toilet transfers with min to mod assist using the RW and 3:1 for support.  She continues to need mod instructional cueing for hand placement and sequencing secondary to trying to stand up with the right hand placed on the RW.  Both sons were able to complete transfer to and from the 3:1 with use of the gait belt in place.  Noted one posterior LOB with her son keeping her from falling when trying to turn in front of the toilet.  Also provided handout on education for LUE AAROM exercises for the shoulder, elbow, and hand.  Completed 2-3 reps for each exercise so they would be able to understand.  Also discussed use of the tub bench in the shower or a smaller seat in the walk-in shower.  Therapist demonstrated use of the tub bench in the room with sitting and swinging the LEs over the edge as well as stepping back over the simulated walk-in shower if they decide to use that one.  She will have a 3:1 which can be placed over the toilet or at bedside.  Emphasized to the pt and her sons that she is not to complete any transfers or sit to stand without someone being right beside of her.  They voiced understanding on this and plan to put up a baby monitoring system at night to help keep her safe.  She was left with the call button and phone in reach.    Therapy Documentation Precautions:  Precautions Precautions: Fall, Other (comment) Precaution Comments: L side hemiparesis, left  neglect, SBP goal 120-140 range Restrictions Weight Bearing Restrictions: No  Pain: Pain Assessment Pain Scale: 0-10 Pain Score: 0-No pain Faces Pain Scale: Hurts a little bit Pain Type: Chronic pain Pain Location: Hand Pain Orientation: Left Pain Descriptors / Indicators: Discomfort Pain Onset: With Activity Pain Intervention(s): Repositioned Multiple Pain Sites: No ADL: See Care Tool Section for some details of mobility and selfcare   Therapy/Group: Individual Therapy  Elonna Mcfarlane OTR/L 01/17/2021, 12:38 PM

## 2021-01-17 NOTE — Progress Notes (Signed)
Physical Therapy Session Note  Patient Details  Name: Alicia Guerrero MRN: [id number] Date of Birth: 07-11-1935  Today's Date: 01/17/2021 PT Individual Time: 1100-1200 PT Individual Time Calculation (min): 60 min   Short Term Goals: Week 3:  PT Short Term Goal 1 (Week 3): Pt will perform supine<>sit with min assist consistently PT Short Term Goal 1 - Progress (Week 3): Met PT Short Term Goal 2 (Week 3): Pt will perform sit<>stand using LRAD with min assist consistently PT Short Term Goal 2 - Progress (Week 3): Met PT Short Term Goal 3 (Week 3): Pt will perform bed<>chair transfers using LRAD with min assist consistently PT Short Term Goal 3 - Progress (Week 3): Progressing toward goal PT Short Term Goal 4 (Week 3): Pt will ambulate at least 37f using LRAD with min assist consistently (including some turning) PT Short Term Goal 4 - Progress (Week 3): Progressing toward goal  Skilled Therapeutic Interventions/Progress Updates:    Patient received in bathroom with NT, sons at bedside, agreeable to family ed. She denies pain. PT educating sons on need for 24/7 supervision and assist with all mobility due to patients poor balance, endurance, L inattention, L hemiparesis and poor safety awareness. PT reiterating importance of using gait belt for all mobility and use of walker. PT also reiterating that patient will need hands on assist for ALL mobility. Sons informing PT that they would be interested in a custom wc for patient, however, due to time constraints, patient will most benefit from standard wc with elevating legs rests. Patient and family agreeable. PT transporting patient in wc to therapy gym for time management and energy conservation. She ambulated 827fwith RW and PT providing MinA. Verbal cues to remain within walker frame. Son able to provide same assist with PT providing supervision for safety. Patient negotiating 4" curb with RW and up to MOdA + verbal cues for sequencing. Sons  providing same assist with PT close supervision/CGA for safety. Patient returning to room in wc. PT informing family of recommendation to continue with OP PT [facility]. Seatbelt alarm on, call light within reach.    Therapy Documentation Precautions:  Precautions Precautions: Fall, Other (comment) Precaution Comments: L side hemiparesis, left neglect, SBP goal 120-140 range Restrictions Weight Bearing Restrictions: No     Therapy/Group: Individual Therapy  JeKaroline CaldwellPT, DPT, CBIS  01/17/2021, 12:10 PM

## 2021-01-17 NOTE — Progress Notes (Signed)
Speech Language Pathology Daily Session Note  Patient Details  Name: Alicia Guerrero MRN: [id number] Date of Birth: 1935-08-03  Today's Date: 01/17/2021 SLP Individual Time: 1000-1100 SLP Individual Time Calculation (min): 60 min  Short Term Goals: Week 4: SLP Short Term Goal 1 (Week 4): STG=LTG due to ELOS  Skilled Therapeutic Interventions: Skilled ST treatment focused on cognitive, swallowing goals, and family education. Patient was accompanied by her two sons this date. SLP educated on cognitive-communication, speech, and swallowing functions. For cognition, SLP discussed orientation, attention, short-term memory, problem solving, and awareness deficits, progress with SLP intervention, and strategies to maximize safety upon discharging home. Family endorsed forgetfulness at prior level and that she required at least supervision A for orientation, recall, and ADLs/iADLs (cooking, eating, taking medication). SLP recommended for family to manage medications and also provide consistent supervision for patient's overall safety and wellness. Both sons verbalized that they plan to fully manage medications and provide supervision at discharge. SLP also educated on results from modified barium swallow study (MBS) obtained on 12/30, diet recommendations/modifications (dysphagia 2 diet, thin liquids, crushed meds), and safe swallowing precautions. Provided pt/family with dys 2 diet handout. Both sons verbalized understanding on dys 2 diet preparation and swallowing precautions. Also discussed recommendations for follow up speech therapy services with home health to address cognitive-communication and swallowing functions. All questions were addressed. Patient was left in wheelchair with alarm activated and immediate needs within reach at end of session. Continue per current plan of care.      Pain Pain Assessment Pain Scale: 0-10 Pain Score: 0-No pain  Therapy/Group: Individual Therapy  Patty Sermons 01/17/2021, 10:57 AM

## 2021-01-18 DIAGNOSIS — G450 Vertebro-basilar artery syndrome: Secondary | ICD-10-CM | POA: Diagnosis not present

## 2021-01-18 DIAGNOSIS — I63511 Cerebral infarction due to unspecified occlusion or stenosis of right middle cerebral artery: Secondary | ICD-10-CM | POA: Diagnosis not present

## 2021-01-18 DIAGNOSIS — I639 Cerebral infarction, unspecified: Secondary | ICD-10-CM | POA: Diagnosis not present

## 2021-01-18 LAB — GLUCOSE, CAPILLARY
Glucose-Capillary: 121 mg/dL — ABNORMAL HIGH (ref 70–99)
Glucose-Capillary: 136 mg/dL — ABNORMAL HIGH (ref 70–99)
Glucose-Capillary: 154 mg/dL — ABNORMAL HIGH (ref 70–99)
Glucose-Capillary: 156 mg/dL — ABNORMAL HIGH (ref 70–99)

## 2021-01-18 MED ORDER — AMLODIPINE BESYLATE 10 MG PO TABS: 10.0000 mg | ORAL_TABLET | Freq: Every day | ORAL | 0 refills | Status: DC

## 2021-01-18 MED ORDER — MELATONIN 3 MG PO TABS: 3.0000 mg | ORAL_TABLET | Freq: Every evening | ORAL | 0 refills | Status: DC | PRN

## 2021-01-18 MED ORDER — IRBESARTAN 75 MG PO TABS: 75.0000 mg | ORAL_TABLET | Freq: Every day | ORAL | 0 refills | Status: DC

## 2021-01-18 MED ORDER — BISOPROLOL FUMARATE 5 MG PO TABS: 5.0000 mg | ORAL_TABLET | Freq: Two times a day (BID) | ORAL | 2 refills | Status: AC

## 2021-01-18 MED ORDER — CYANOCOBALAMIN 1000 MCG PO TABS
1000.0000 ug | ORAL_TABLET | Freq: Every day | ORAL | 0 refills | Status: AC
Start: 2021-01-18 — End: ?

## 2021-01-18 MED ORDER — APIXABAN 5 MG PO TABS: 5.0000 mg | ORAL_TABLET | Freq: Two times a day (BID) | ORAL | 0 refills | Status: DC

## 2021-01-18 MED ORDER — ATORVASTATIN CALCIUM 80 MG PO TABS: 80.0000 mg | ORAL_TABLET | Freq: Every day | ORAL | 0 refills | Status: DC

## 2021-01-18 MED ORDER — TRAZODONE HCL 50 MG PO TABS
50.0000 mg | ORAL_TABLET | Freq: Every day | ORAL | 0 refills | Status: AC
Start: 2021-01-18 — End: ?

## 2021-01-18 MED ORDER — POLYETHYLENE GLYCOL 3350 17 G PO PACK
17.0000 g | PACK | Freq: Every day | ORAL | 0 refills | Status: AC | PRN
Start: 2021-01-18 — End: ?

## 2021-01-18 NOTE — Progress Notes (Signed)
Physical Therapy Session Note  Patient Details  Name: Alicia Guerrero MRN: [id number] Date of Birth: 1935-06-05  Today's Date: 01/18/2021 PT Individual Time: 1006-1105 PT Individual Time Calculation (min): 59 min   Short Term Goals: Week 4:  PT Short Term Goal 1 (Week 4): = to LTGs based on ELOS  Skilled Therapeutic Interventions/Progress Updates:    Pt received sitting in TIS w/c with son, Jeral Fruit, present and pt agreeable to therapy session. Pt/son report no questions/concerns from family education yesterday. Discussed custom vs standard wheelchair recommendations with current plan to rent a standard wheelchair and then follow-up with whether or not a custom wheelchair is needed on outpatient basis - therapist provided family with contact information for custom wheelchair consult. Educated on recommendation for follow-up HHPT - pt/family in agreement. Transported to/from gym in w/c for time management and energy conservation. Therapist educated on set-up of ambulatory vs stand pivot car transfer using wheelchair as well as proper sequencing for the transfer. Performed simulated ambulatory car transfer (sedan height) using RW with pt's son providing assistance - pt's son does excellent job of cuing patient with ensuring her safety. Pt continues to have impaired alternating/divided attention and moving towards a crouched posture during dual-tasks - educated pt's son on this and he does well cuing her to return to upright posture. Pt ambulated ~65ft up/down ramp using RW with pt's son providing physical assistance - therapist educated on importance of lining up AD to go straight on/off ramp to ensure balance safety as pt will soon have a ramped entrance into her home (pt's other son working on that today). Step navigation practice using RW stepping on/off 5" step using RW with pt's son providing hands-on assistance - pt requires max cuing to ensure safety during this task and min assist to lift RW on/off step  - pt also has worsening "crouched" posture during this task due to motor planning challenges. Pt/son report no questions/concerns and report feeling confident/comfortable with D/C plan. Transported back to room and left sitting in TIS w/c with her son present as hand-off to SLP.   Therapy Documentation Precautions:  Precautions Precautions: Fall, Other (comment) Precaution Comments: L side hemiparesis, left neglect, SBP goal 120-140 range Restrictions Weight Bearing Restrictions: No   Pain:  No reports of pain throughout session.    Therapy/Group: Individual Therapy  Tawana Scale , PT, DPT, NCS, CSRS  01/18/2021, 7:57 AM

## 2021-01-18 NOTE — Progress Notes (Signed)
Inpatient Rehabilitation Discharge Medication Review by a Pharmacist  A complete drug regimen review was completed for this patient to identify any potential clinically significant medication issues.  High Risk Drug Classes Is patient taking? Indication by Medication  Antipsychotic No   Anticoagulant Yes Eliquis for Afib/CVA  Antibiotic No   Opioid No   Antiplatelet Yes Aspirin for CVA ppx  Hypoglycemics/insulin No ? Metformin for DM  Vasoactive Medication Yes Amlodpine, bisoprolol, irbesartan for BP, Afib  Chemotherapy No   Other Yes Lipitor for HLD Trazodone for sleep Claritin for allergies     Type of Medication Issue Identified Description of Issue Recommendation(s)  Drug Interaction(s) (clinically significant)     Duplicate Therapy     Allergy     No Medication Administration End Date     Incorrect Dose     Additional Drug Therapy Needed     Significant med changes from prior encounter (inform family/care partners about these prior to discharge).    Other       Clinically significant medication issues were identified that warrant physician communication and completion of prescribed/recommended actions by midnight of the next day:  No  Pharmacist comments: None  Time spent performing this drug regimen review (minutes):  20 minutes   Tad Moore 01/18/2021 8:42 AM

## 2021-01-18 NOTE — Progress Notes (Signed)
Physical Therapy Discharge Summary  Patient Details  Name: Alicia Guerrero MRN: [id number] Date of Birth: [birth year]  Today's Date: 01/18/2021 PT Individual Time: 6606-3016 PT Individual Time Calculation (min): 26 min    Patient has met 8 of 10 long term goals due to improved activity tolerance, improved balance, improved postural control, increased strength, ability to compensate for deficits, functional use of  left upper extremity and left lower extremity, improved attention, improved awareness, and improved coordination.  Patient to discharge at a household ambulatory level using RW with Min Assist requiring dependent wheelchair propulsion for community mobility.  Patient's care partners attended hands-on education/training and are independent to provide the necessary physical and cognitive assistance at discharge.  Reasons goals not met: Pt continues to require increased assistance for wheelchair propulsion due to motor planning and  focus on reaching patient's ambulatory level goals.  Recommendation:  Patient will benefit from ongoing skilled PT services in home health setting to continue to advance safe functional mobility, address ongoing impairments in dual-task training during standing/gait, dynamic standing balance, L hemibody NMR, dynamic gait training, and minimize fall risk.  Equipment: 16x16 rental wheelchair with cushion, youth height RW  Reasons for discharge: treatment goals met and discharge from hospital  Patient/family agrees with progress made and goals achieved: Yes  Skilled Therapeutic Interventions/Progress Updates:  Pt received sitting in TIS w/c and agreeable to therapy session. Reports need to use bathroom. Sit>stand w/c>RW with CGA, pt uses backs of legs against chair to assist with rising to stand. Gait ~82f into bathroom using RW with CGA/light min assist for balance especially while turning to sit requiring cuing to step back fully prior to initiating sit  (tends to have minor posterior LOB when stepping backwards). Standing with CGA and again pt using backs of legs against BSC over toilet to steady balance during min assist LB clothing management - continent of bladder and bowels. Standing with CGA and using UE support on RW as needed performed peri-care with set-up assist and requires mod assist to pull pants up over hips - cuing to ensure placing L hand back on RW prior to initiating gait out of bathroom. Continues to demo gradual progression into a crouched/squatted posture during dual tasks of LB clothing management or hand hygiene. Gait ~174fto sink using RW with CGA - max cuing for proper AD management at counter - standing at sink with CGA due to gradual crouch performed hand hygiene without assist. Pt sat in her rental wheelchair and it has good fit; however, will need to have seat height lowered in order for patient to perform hemi-propulsion, which can be done by follow-up HHPT. Also, follow-up HHPT can assess pt's need for custom wheelchair consultation if she will be a full time wheelchair user. This therapist adjusted leg rest height for improved pressure distribution. Pt left seated in w/c with needs in reach and seat belt alarm on.  PT Discharge Precautions/Restrictions Precautions Precautions: Fall;Other (comment) Precaution Comments: L hemiparesis (UE>LE),  SBP goal 120-140 range Restrictions Weight Bearing Restrictions: No Pain Pain Assessment Pain Scale: 0-10 Pain Score: 0-No pain Pain Interference Pain Interference Pain Effect on Sleep: 1. Rarely or not at all Pain Interference with Therapy Activities: 1. Rarely or not at all Pain Interference with Day-to-Day Activities: 1. Rarely or not at all Vision/Perception  Vision - History Ability to See in Adequate Light: 0 Adequate Perception Perception: Impaired Inattention/Neglect: Does not attend to left side of body (mild L inattention, continues to require verbal  cuing) Praxis Praxis: Impaired Praxis Impairment Details: Motor planning  Cognition Overall Cognitive Status: Impaired/Different from baseline Arousal/Alertness: Awake/alert Orientation Level: Oriented X4 Month: January Day of Week: Correct Attention: Focused;Sustained;Selective;Alternating Focused Attention: Appears intact Sustained Attention: Appears intact Selective Attention: Impaired Selective Attention Impairment: Verbal basic;Functional basic Alternating Attention: Impaired Memory: Impaired Awareness: Impaired Problem Solving: Impaired Safety/Judgment: Impaired Sensation Sensation Light Touch: Appears Intact Hot/Cold: Not tested Proprioception: Appears Intact (impaired attention to LE positoining resulting in crouched posture as opposed to proprioceptive impairment) Stereognosis: Not tested Additional Comments: Light touch, hot/cold, and proprioception intact in BUEs. Coordination Gross Motor Movements are Fluid and Coordinated: No Fine Motor Movements are Fluid and Coordinated: No Coordination and Movement Description: L hemiparesis UE more impaired than LE and L UE having more proximal weakness noted than distally. Motor  Motor Motor: Hemiplegia;Abnormal postural alignment and control Motor - Discharge Observations: Still with LUE > LLE hemiparesis.  Mobility Bed Mobility Bed Mobility: Supine to Sit;Sit to Supine Supine to Sit: Minimal Assistance - Patient > 75% Sit to Supine: Minimal Assistance - Patient > 75% Transfers Transfers: Stand to Sit;Sit to Stand;Stand Pivot Transfers Sit to Stand: Contact Guard/Touching assist;Minimal Assistance - Patient > 75% Stand to Sit: Contact Guard/Touching assist;Minimal Assistance - Patient > 75% Stand Pivot Transfers: Minimal Assistance - Patient > 75% Stand Pivot Transfer Details: Verbal cues for technique;Verbal cues for safe use of DME/AE;Verbal cues for sequencing;Tactile cues for weight shifting;Tactile cues for  sequencing;Tactile cues for posture;Verbal cues for precautions/safety Transfer (Assistive device): Rolling walker Locomotion  Gait Ambulation: Yes Gait Assistance: Minimal Assistance - Patient > 75% Gait Distance (Feet): 130 Feet Assistive device: Rolling walker Gait Assistance Details: Verbal cues for safe use of DME/AE;Verbal cues for precautions/safety;Verbal cues for sequencing;Tactile cues for posture Gait Gait: Yes Gait Pattern: Impaired Gait Pattern: Step-through pattern;Decreased step length - right;Decreased step length - left;Poor foot clearance - left;Poor foot clearance - right (gradual crouched posture with fatigue or dual-task) Gait velocity: decreased Stairs / Additional Locomotion Stairs: Yes Stairs Assistance: Moderate Assistance - Patient 50 - 74% Stair Management Technique: Two rails;Alternating pattern;Step to pattern Number of Stairs: 4 Height of Stairs: 6 Ramp: Minimal Assistance - Patient >75% Curb: Minimal Assistance - Patient >75% Wheelchair Mobility Wheelchair Mobility: Yes Wheelchair Assistance: Dependent - Patient 0% Wheelchair Parts Management: Needs assistance  Trunk/Postural Assessment  Cervical Assessment Cervical Assessment: Exceptions to [facility] (forward head) Thoracic Assessment Thoracic Assessment: Exceptions to [facility] (thoracic rounding) Lumbar Assessment Lumbar Assessment: Exceptions to [facility] (posterior pelvic tilt) Postural Control Postural Control: Deficits on evaluation Trunk Control: significantly improved to supervision-min assist sitting balance  Balance Balance Balance Assessed: Yes Static Sitting Balance Static Sitting - Balance Support: Feet supported Static Sitting - Level of Assistance: 5: Stand by assistance Dynamic Sitting Balance Dynamic Sitting - Balance Support: Feet unsupported;During functional activity Dynamic Sitting - Level of Assistance: 4: Min assist Static Standing Balance Static Standing - Balance Support: During  functional activity;Bilateral upper extremity supported Static Standing - Level of Assistance: Other (comment) (CGA) Dynamic Standing Balance Dynamic Standing - Balance Support: During functional activity;Bilateral upper extremity supported Dynamic Standing - Level of Assistance: 4: Min assist;Other (comment) (CGA) Extremity Assessment      RLE Assessment RLE Assessment: Within Functional Limits LLE Assessment LLE Assessment: Exceptions to [facility] Active Range of Motion (AROM) Comments: WFL General Strength Comments: grossly 4/5 observed functionally    Tawana Scale , PT, DPT, NCS, CSRS 01/18/2021, 7:59 AM

## 2021-01-18 NOTE — Plan of Care (Signed)
Problem: RH Swallowing Goal: LTG Patient will consume least restrictive diet using compensatory strategies with assistance (SLP) Description: LTG:  Patient will consume least restrictive diet using compensatory strategies with assistance (SLP) Outcome: Completed/Met Goal: LTG Patient will participate in dysphagia therapy to increase swallow function with assistance (SLP) Description: LTG:  Patient will participate in dysphagia therapy to increase swallow function with assistance (SLP) Outcome: Completed/Met Goal: LTG Pt will demonstrate functional change in swallow as evidenced by bedside/clinical objective assessment (SLP) Description: LTG: Patient will demonstrate functional change in swallow as evidenced by bedside/clinical objective assessment (SLP) Outcome: Completed/Met   Problem: RH Expression Communication Goal: LTG Patient will increase speech intelligibility (SLP) Description: LTG: Patient will increase speech intelligibility at word/phrase/conversation level with cues, % of the time (SLP) Outcome: Completed/Met   Problem: RH Problem Solving Goal: LTG Patient will demonstrate problem solving for (SLP) Description: LTG:  Patient will demonstrate problem solving for basic/complex daily situations with cues  (SLP) Outcome: Completed/Met   Problem: RH Memory Goal: LTG Patient will use memory compensatory aids to (SLP) Description: LTG:  Patient will use memory compensatory aids to recall biographical/new, daily complex information with cues (SLP) Outcome: Completed/Met   Problem: RH Attention Goal: LTG Patient will demonstrate this level of attention during functional activites (SLP) Description: LTG:  Patient will will demonstrate this level of attention during functional activites (SLP) Outcome: Completed/Met

## 2021-01-18 NOTE — Progress Notes (Signed)
Occupational Therapy Discharge Summary  Patient Details  Name: Alicia Guerrero MRN: [id number] Date of Birth: 1935/02/05  Today's Date: 01/18/2021 OT Individual Time: 4098-1191 OT Individual Time Calculation (min): 42 min   Session Note:  Pt in bed to start with min assist transfer over to the EOB on the right side completed.  She then worked on donning her lace up shoes with mod assist overall and therapist tying them.  Min assist stand pivot transfer was completed from the bed to the wheelchair for transport out of the room.  She then worked on simulated walk-in shower transfers in the ADL apartment.  With use of the RW and shower seat she was able to step backwards into the simulated shower at min assist level and then sit.  Mod instructional cueing to complete for hand placement to push up from the wheelchair with the RUE and not pull up on the RW with both hands.  The LUE was placed on the walker after standing and she was able to maintain grasp without use of the hand splint.  Returned to the room at the conclusion of practice with transfers, where pt was left in the room sitting up in the wheelchair with safety alarm belt in place and call button and phone in reach of the [place].  NT in to take vitals as well.    Patient has met 8 of 12 long term goals due to improved activity tolerance, improved balance, postural control, ability to compensate for deficits, functional use of  LEFT upper and LEFT lower extremity, improved attention, improved awareness, and improved coordination.  Patient to discharge at [facility] Assist level.  Patient's care partner is independent to provide the necessary physical and cognitive assistance at discharge.    Reasons goals not met: Pt needs mod assist for dressing tasks as well as min assist for toilet transfers with use of the RW and 3:1.  Recommendation:  Patient will benefit from ongoing skilled OT services in home health setting to continue to advance functional  skills in the area of BADL and Reduce care partner burden.  Feel pt will benefit from ongoing [place] to further progress ADL function as well as increasing LUE functional use to a non-dominant level or better for selfcare tasks and self feeding.  Pt is a high fall risk and demonstrates decreased safety awareness.  She will need 24 hr supervision which has been explained to her sons, and they voice understanding and will provide.    Equipment: No equipment provided  Reasons for discharge: treatment goals met and discharge from hospital  Patient/family agrees with progress made and goals achieved: Yes  OT Discharge Precautions/Restrictions  Precautions Precautions: Fall;Other (comment) Precaution Comments: L side hemiparesis,  SBP goal 120-140 range Restrictions Weight Bearing Restrictions: No   Pain Pain Assessment Pain Scale: Faces Faces Pain Scale: Hurts a little bit Pain Type: Chronic pain Pain Location: Hand Pain Orientation: Right Pain Descriptors / Indicators: Discomfort Pain Onset: With Activity Pain Intervention(s): Repositioned;Emotional support ADL ADL Eating: Supervision/safety Where Assessed-Eating: Wheelchair Grooming: Supervision/safety Where Assessed-Grooming: Wheelchair Upper Body Bathing: Minimal assistance Where Assessed-Upper Body Bathing: Wheelchair Lower Body Bathing: Minimal assistance Where Assessed-Lower Body Bathing: Wheelchair Upper Body Dressing: Moderate assistance Where Assessed-Upper Body Dressing: Wheelchair Lower Body Dressing: Moderate assistance Where Assessed-Lower Body Dressing: Wheelchair Toileting: Moderate assistance Where Assessed-Toileting: Bedside Commode Toilet Transfer: Minimal assistance Toilet Transfer Method: Counselling psychologist: Radiographer, therapeutic: Not assessed Social research officer, government: Environmental education officer  Method: Ambulating Vision Baseline Vision/History: 1 Wears  glasses Patient Visual Report: No change from baseline Vision Assessment?: Yes Eye Alignment: Within Functional Limits Alignment/Gaze Preference: Within Defined Limits Tracking/Visual Pursuits: Able to track stimulus in all quads without difficulty Saccades: Within functional limits Convergence: Within functional limits Visual Fields: No apparent deficits Perception  Perception: Impaired Inattention/Neglect: Does not attend to left side of body (slight left inattention) Praxis Praxis: Impaired Praxis Impairment Details: Motor planning Cognition Overall Cognitive Status: Impaired/Different from baseline Arousal/Alertness: Awake/alert Orientation Level: Oriented X4 Year: Other (Comment) (6834) Month: January Day of Week: Correct Attention: Selective;Focused;Sustained Focused Attention: Appears intact Sustained Attention: Appears intact Selective Attention: Impaired Selective Attention Impairment: Verbal basic;Functional basic Memory: Impaired Memory Impairment: Storage deficit;Decreased recall of new information Decreased Short Term Memory: Functional basic Immediate Memory Recall: Sock;Blue;Bed Memory Recall Sock: Without Cue Memory Recall Blue: Without Cue Memory Recall Bed: Without Cue Awareness: Impaired Awareness Impairment: Emergent impairment Problem Solving: Impaired Problem Solving Impairment: Verbal basic;Functional basic Decision Making: Impaired Decision Making Impairment: Verbal basic;Functional basic Behaviors: Impulsive Safety/Judgment: Impaired Sensation Sensation Light Touch: Appears Intact Hot/Cold: Appears Intact Proprioception: Appears Intact Stereognosis: Not tested Additional Comments: Light touch, hot/cold, and proprioception intact in BUEs. Coordination Gross Motor Movements are Fluid and Coordinated: No Fine Motor Movements are Fluid and Coordinated: No Coordination and Movement Description: Pt still with significant LUE hemiparesis, more  proximal weakness noted than distally.  She can use as a gross assist at min assist overall with mod assist to integrate into bathing tasks and self feeding with AE. Motor  Motor Motor: Hemiplegia;Abnormal postural alignment and control Motor - Discharge Observations: Still with LUE and LLE hemiparesis.  More impaired currently in the LUE. Mobility  Bed Mobility Bed Mobility: Supine to Sit;Sit to Supine Supine to Sit: Minimal Assistance - Patient > 75% Sit to Supine: Minimal Assistance - Patient > 75% Transfers Sit to Stand: Minimal Assistance - Patient > 75% Stand to Sit: Minimal Assistance - Patient > 75%  Trunk/Postural Assessment  Cervical Assessment Cervical Assessment: Exceptions to [facility] (cervical protraction) Thoracic Assessment Thoracic Assessment: Exceptions to [facility] (thoracic kyphosis) Lumbar Assessment Lumbar Assessment: Exceptions to [facility] (posterior pelvic tilt in sitting)  Balance Balance Balance Assessed: Yes Static Sitting Balance Static Sitting - Balance Support: Feet supported Static Sitting - Level of Assistance: 5: Stand by assistance Dynamic Sitting Balance Dynamic Sitting - Balance Support: Feet unsupported;During functional activity Dynamic Sitting - Level of Assistance: 4: Min assist Static Standing Balance Static Standing - Balance Support: During functional activity;Bilateral upper extremity supported Static Standing - Level of Assistance:  (contact guard) Dynamic Standing Balance Dynamic Standing - Balance Support: During functional activity;Left upper extremity supported Dynamic Standing - Level of Assistance: 4: Min assist Extremity/Trunk Assessment RUE Assessment RUE Assessment: Within Functional Limits General Strength Comments: AROM WFLS for selfcare tasks, strength not formally assessed throughout but grip 4/5 LUE Assessment LUE Assessment: Exceptions to [facility] Passive Range of Motion (PROM) Comments: WFLS for shoulder and elbow, limitations in wrist  as well as the hand with flexion and extension secondary to arthritis and increased pain at MPs. Active Range of Motion (AROM) Comments: Brunnstrum stage IV movement in the arm with stage V in the hand.  She is able to exhibit 70% gross grasp with 80% gross extension.  She is able to oppose the thumb to the index finger only.  Decreased PROM noted in the thumb as well with moderate pain in all joints of the IP, MCP, CMC with flexion.  She  also reports some increased pain when attempting AAROM full digit extension at the MPs of the index and middle fingers.   Fitzgerald Dunne OTR/L 01/18/2021, 5:44 PM

## 2021-01-18 NOTE — Progress Notes (Signed)
Occupational Therapy Session Note  Patient Details  Name: Alicia Guerrero MRN: [id number] Date of Birth: [birth year]  Today's Date: 01/18/2021 OT Individual Time: 0905-1005 OT Individual Time Calculation (min): 60 min    Short Term Goals: Week 4:  OT Short Term Goal 1 (Week 4): Continue working on established LTGs set at min assist overall.  Skilled Therapeutic Interventions/Progress Updates:    Pt in tilt in space wheelchair to start with sons present.  Discussed yesterdays practice sessions and they feel comfortable assisting pt at the current level with transfers and selfcare tasks.  One son Dewayne left to continue working on a ramp after therapist acknowledged that he was OK to go if needed as education was completed appropriately for OT yesterday.  Pt worked on bathing and dressing during session from tilt in space wheelchair.  She needed mod facilitation for reaching out to the wash pan with the LUE and then for squeezing out the washcloth.  She completed UB bathing with min assist and then LB bathing at the same level.  She donned a pullover shirt with mod assist and then completed donning LB clothing at mod assist level sit to stand including her socks and lace up shoes.  Her son was present throughout and was able to observe techniques for helping to facilitate dressing tasks, and not just doing it for her.  Once dressing was complete, had her use the RW and ambulate over to the sink with min assist for brushing her hair at the mirror.  Finished with return to the wheelchair where she was left with her son in the room and call button in reach with expectations of PT to come in next.       Therapy Documentation Precautions:  Precautions Precautions: Fall, Other (comment) Precaution Comments: L side hemiparesis, left neglect, SBP goal 120-140 range Restrictions Weight Bearing Restrictions: No  Pain: Pain Assessment Pain Scale: 0-10 Pain Score: 0-No pain Faces Pain Scale: Hurts a  little bit Pain Type: Acute pain Pain Location: Hand Pain Orientation: Left Pain Descriptors / Indicators: Discomfort Pain Onset: With Activity Pain Intervention(s): Repositioned;Emotional support ADL: See Care Tool Section for some details of mobility and selfcare  Therapy/Group: Individual Therapy  Ether Goebel OTR/L 01/18/2021, 12:58 PM

## 2021-01-18 NOTE — Progress Notes (Signed)
PROGRESS NOTE   Subjective/Complaints:  Sons present for family training yesterday but daughter no showed    ROS:   Pt denies SOB, abd pain, CP, N/V/C/D, and vision changes     Objective:   No results found. Recent Labs    01/17/21 0558  WBC 7.9  HGB 13.2  HCT 41.1  PLT 283      Recent Labs    01/17/21 0558  NA 138  K 4.1  CL 103  CO2 24  GLUCOSE 119*  BUN 23  CREATININE 0.87  CALCIUM 9.5       Intake/Output Summary (Last 24 hours) at 01/18/2021 0814 Last data filed at 01/18/2021 3893 Gross per 24 hour  Intake 1154 ml  Output 1 ml  Net 1153 ml           Physical Exam: Vital Signs Blood pressure 136/70, pulse 67, temperature (!) 97.5 F (36.4 C), temperature source Oral, resp. rate 18, height 5\' 3"  (1.6 m), weight 63.4 kg, SpO2 97 %.   General: No acute distress Mood and affect are appropriate Heart: Irregular Regular rate and rhythm no rubs murmurs or extra sounds Lungs: Clear to auscultation, breathing unlabored, no rales or wheezes Abdomen: Positive bowel sounds, soft nontender to palpation, nondistended Extremities: No clubbing, cyanosis, or edema Skin: No evidence of breakdown, no evidence of rash   Ext: no clubbing, cyanosis, or edema Psych: pleasant and cooperative  Skin: No evidence of breakdown, no evidence of rash Neurologic: Oriented to person , hospital, month, stroke.  Cranial nerves II through XII intact, motor strength is 5/5 in Right deltoid, bicep, tricep, grip, hip flexor, knee extensors, ankle dorsiflexor and plantar flexor, 2- Left delt bic , tri, 2 grip, 3-4/5 L HF, KE ADF, Tone 1/4 left wrist finger flexors Sensory exam normal sensation to light touch and proprioception in bilateral upper and lower extremities  Musculoskeletal: Full range of motion in all 4 extremities. No joint swelling    Assessment/Plan: 1. Functional deficits which require 3+ hours per day  of interdisciplinary therapy in a comprehensive inpatient rehab setting. Physiatrist is providing close team supervision and 24 hour management of active medical problems listed below. Physiatrist and rehab team continue to assess barriers to discharge/monitor patient progress toward functional and medical goals  Care Tool:  Bathing    Body parts bathed by patient: Chest, Abdomen, Right upper leg, Left upper leg, Face, Left arm, Left lower leg, Right lower leg, Front perineal area, Buttocks   Body parts bathed by helper: Right arm Body parts n/a: Front perineal area, Buttocks   Bathing assist Assist Level: Minimal Assistance - Patient > 75%     Upper Body Dressing/Undressing Upper body dressing   What is the patient wearing?: Pull over shirt    Upper body assist Assist Level: Moderate Assistance - Patient 50 - 74%    Lower Body Dressing/Undressing Lower body dressing      What is the patient wearing?: Pants     Lower body assist Assist for lower body dressing: Moderate Assistance - Patient 50 - 74%     Toileting Toileting    Toileting assist Assist for toileting: Minimal Assistance - Patient >  75%     Transfers Chair/bed transfer  Transfers assist  Chair/bed transfer activity did not occur: Safety/medical concerns  Chair/bed transfer assist level: Minimal Assistance - Patient > 75% Chair/bed transfer assistive device: Armrests, Programmer, multimedia   Ambulation assist   Ambulation activity did not occur: Safety/medical concerns  Assist level: Minimal Assistance - Patient > 75% Assistive device: Walker-rolling Max distance: 130 ft   Walk 10 feet activity   Assist  Walk 10 feet activity did not occur: Safety/medical concerns  Assist level: Minimal Assistance - Patient > 75% Assistive device: Walker-rolling   Walk 50 feet activity   Assist Walk 50 feet with 2 turns activity did not occur: Safety/medical concerns  Assist level: Minimal  Assistance - Patient > 75% Assistive device: Walker-rolling    Walk 150 feet activity   Assist Walk 150 feet activity did not occur: Safety/medical concerns         Walk 10 feet on uneven surface  activity   Assist Walk 10 feet on uneven surfaces activity did not occur: Safety/medical concerns         Wheelchair     Assist Is the patient using a wheelchair?: No             Wheelchair 50 feet with 2 turns activity    Assist            Wheelchair 150 feet activity     Assist          Blood pressure 136/70, pulse 67, temperature (!) 97.5 F (36.4 C), temperature source Oral, resp. rate 18, height 5\' 3"  (1.6 m), weight 63.4 kg, SpO2 97 %.  Medical Problem List and Plan: 1. Functional deficits secondary to posterior circulation stroke             -patient may shower             -ELOS/Goals: d/c 1/4 S- Family requesting Rx tomorrow vs day of discharge         -Continue CIR- PT, OT and SLP; MBS today? 2.  Antithrombotics: -DVT/anticoagulation:  Pharmaceutical: Other (comment)--Eliquis             -antiplatelet therapy: ASA added  3. Pain Management: Nerve pain in L hand Tylenol prn.   12/30- will add gabapentin 300 mg QHS for nerve pain/also help sleep some- will monitor closely due to age 89. Mood: LCSW to follow for evaluation and support.              -antipsychotic agents: N/A 5. Neuropsych: This patient is not fully capable of making decisions on capable own behalf. 6. Skin/Wound Care: Routine pressure relief measures 7. Fluids/Electrolytes/Nutrition:  encourage PO  12/26- labs look great- except Glucose of 119- everything is normal range- I personally assessed 8. CAF/ICM s/p ICD: Monitor HR TID. Continue Eliquis and bisoprolol.             --monitor for symptoms with increase in activity.   12/24- HR controlled- con't regimen 9. H/o CVA with L-HP: Was independent without AD PTA. 10. HTN: Monitor BP TID--SBP goal 120-140 range per neur due  to MCA/PCA stenosis             --continue bisoprolol and Avapro. Increase amlodipine to 10mg .   01/18/21- BP controlled-  Vitals:   01/17/21 1939 01/18/21 0604  BP: 130/63 136/70  Pulse: 62 67  Resp: 18 18  Temp: 98 F (36.7 C) (!) 97.5 F (36.4 C)  SpO2: 95%  97%    11. Dysphagia: Continue D1, nectar liquids.  .             -12/19 encouraged her to push through diet until advanced  12/27- up to D2 nectar thick liquids. MBS on Friday  12/30- pending new MBS? Will check? 12. T2DM: Hgb A1C- 6.0. Continue to hold metformin             --monitor BS ac/hs and use SSI for now.  CBG (last 3)  Recent Labs    01/17/21 1613 01/17/21 2052 01/18/21 0602  GLUCAP 127* 140* 121*    01/17/21 controlled , D/C SSI on discharge, will restart metformin home dose  After that pt less compliant with diabetic diet at home , discussed with sons 45. Chronic intermittent insomnia: Will resume Melatonin (used 10 mg prn PTA) 14. Chronic congeseted cough: For 5-6 years and felt to be due to allergies. --added Claritin.    15.  Lethargy/confusion- resolved   labs ok, afebrile, no sedating meds   UCx no growth 16. Loose stools  Last BM 12/31 - now constipated  17. Insomnia  12/30- added gabapentin 300 mg QHS for pain/sleep and also changed trazodone prn to melatonin 3 mg QHS prn for sleep.   LOS: 27 days A FACE TO [place] E Adaliah Hiegel 01/18/2021, 8:14 AM

## 2021-01-18 NOTE — Progress Notes (Signed)
Speech Language Pathology Discharge Summary  Patient Details  Name: Alicia Guerrero MRN: [id number] Date of Birth: 11-06-1935  Today's Date: 01/18/2021 SLP Individual Time: 1102-1130 SLP Individual Time Calculation (min): 28 min  Skilled Therapeutic Interventions: Skilled ST treatment focused on cognitive goals. Patient was accompanied by her son this date who was receptive to further education on cognitive-communication and swallow function. Patient was oriented x4 with modified independence for use of external aids. Pt eager to discharge tomorrow. SLP facilitated session by providing min A fading to sup A verbal cues for selective attention, problem solving, and sequencing with object sorting task. Patient also completed alternating attention task and required mod fading to min A for completion and additional processing time. Patient continuing to require increased cueing for higher level cognitive tasks. She's at supervision level for basic level tasks. SLP provided education on ideas for activities to complete at home that are enjoyable while also cognitively stimulating per son's request. Patient was left in chair with alarm activated and immediate needs within reach at end of session.   Patient has met 7 of 7 long term goals.  Patient to discharge at overall Supervision (basic level cognition) level.  Reasons goals not met: N/A   Clinical Impression/Discharge Summary: Patient has made excellent gains and has met 7 of 7 long-term goals this admission. Patient is currently an overall supervision-to-min A in regards to basic cognitive tasks involving orientation, selective attention, basic problem solving, functional recall, and emergent awareness. Patient has made excellent progress with speech and is perceived as 100% intelligible at conversation level with mod I for implementation of speech strategies. Patient is currently consuming a dysphagia 2 diet and thin liquids and requires supervision A  for implementation of safe swallow precautions and strategies including small bites/sips, taking one bite at a time, and clearing oral residue. Patient and family education is complete and patient to discharge at overall supervision level. Patient's care partner is independent to provide the necessary physical and cognitive assistance at discharge. Patient would benefit from continued SLP services in home health setting to maximize cognitive and swallowing function and functional independence.    Care Partner:  Caregiver Able to Provide Assistance: Yes  Type of Caregiver Assistance: Cognitive;Physical  Recommendation:  24 hour supervision/assistance;Home Health SLP  Rationale for SLP Follow Up: Maximize cognitive function and independence;Maximize swallowing safety;Reduce caregiver burden   Equipment: N/A   Reasons for discharge: Discharged from hospital;Treatment goals met   Patient/Family Agrees with Progress Made and Goals Achieved: Yes    Taleigha Pinson T Reace Breshears 01/18/2021, 1:46 PM

## 2021-01-18 NOTE — Progress Notes (Signed)
Inpatient Rehabilitation Care Coordinator Discharge Note   Patient Details  Name: Alicia Guerrero MRN: [id number] Date of Birth: 1935-08-01   Discharge location: Home  Length of Stay: 27 days  Discharge activity level: Min/Mod  Home/community participation: patient son and DIL  Patient response MH:WKGSUP Literacy - How often do you need to have someone help you when you read instructions, pamphlets, or other written material from your doctor or pharmacy?: Always  Patient response JS:RPRXYV Isolation - How often do you feel lonely or isolated from those around you?: Never  Services provided included: SW, Pharmacy, TR, CM, SLP, RN, OT, PT, MD, RD  Financial Services:  Financial Services Utilized: [employer]  Choices offered to/list presented to: Pt son  Follow-up services arranged:  [place]: [facility]         Patient response to transportation need: Is the patient able to respond to transportation needs?: Yes In the past 12 months, has lack of transportation kept you from medical appointments or from getting medications?: No In the past 12 months, has lack of transportation kept you from meetings, work, or from getting things needed for daily living?: No    Comments (or additional information):  Patient/Family verbalized understanding of follow-up arrangements:  Yes  Individual responsible for coordination of the follow-up plan: [name] [phone]  Confirmed correct DME delivered: Dyanne Iha 01/18/2021    Dyanne Iha

## 2021-01-19 DIAGNOSIS — I639 Cerebral infarction, unspecified: Secondary | ICD-10-CM | POA: Diagnosis not present

## 2021-01-19 DIAGNOSIS — I63511 Cerebral infarction due to unspecified occlusion or stenosis of right middle cerebral artery: Secondary | ICD-10-CM | POA: Diagnosis not present

## 2021-01-19 DIAGNOSIS — G450 Vertebro-basilar artery syndrome: Secondary | ICD-10-CM | POA: Diagnosis not present

## 2021-01-19 LAB — GLUCOSE, CAPILLARY: Glucose-Capillary: 128 mg/dL — ABNORMAL HIGH (ref 70–99)

## 2021-01-19 NOTE — Progress Notes (Signed)
INPATIENT REHABILITATION DISCHARGE NOTE     Discharge instructions by:Pam PA-C   Verbalized understanding: yes   Skin care/Wound care healing? None    Pain: no pain    IV's: N/A   Tubes/Drains: none    O2: room air    Safety instructions: given to family    Patient belongings: sent with son   Discharged to: home    Discharged via: wheelchair

## 2021-01-19 NOTE — Progress Notes (Signed)
PROGRESS NOTE   Subjective/Complaints:  Feels ready to go home, no c/os  ROS:   Pt denies SOB, abd pain, CP, N/V/C/D, and vision changes     Objective:   No results found. Recent Labs    01/17/21 0558  WBC 7.9  HGB 13.2  HCT 41.1  PLT 283      Recent Labs    01/17/21 0558  NA 138  K 4.1  CL 103  CO2 24  GLUCOSE 119*  BUN 23  CREATININE 0.87  CALCIUM 9.5       Intake/Output Summary (Last 24 hours) at 01/19/2021 0935 Last data filed at 01/19/2021 0830 Gross per 24 hour  Intake 712 ml  Output --  Net 712 ml           Physical Exam: Vital Signs Blood pressure (!) 149/73, pulse 67, temperature 97.8 F (36.6 C), temperature source Oral, resp. rate 16, height 5\' 3"  (1.6 m), weight 63.4 kg, SpO2 94 %.  General: No acute distress Mood and affect are appropriate Heart: Regular rate and rhythm no rubs murmurs or extra sounds Lungs: Clear to auscultation, breathing unlabored, no rales or wheezes Abdomen: Positive bowel sounds, soft nontender to palpation, nondistended Extremities: No clubbing, cyanosis, or edema Skin: No evidence of breakdown, no evidence of rash  Ext: no clubbing, cyanosis, or edema Psych: pleasant and cooperative  Skin: No evidence of breakdown, no evidence of rash Neurologic: Oriented to person , hospital, month, stroke.  Cranial nerves II through XII intact, motor strength is 5/5 in Right deltoid, bicep, tricep, grip, hip flexor, knee extensors, ankle dorsiflexor and plantar flexor, 3- Left delt bic , tri, 3- grip, 3-4/5 L HF, KE ADF, Tone 1/4 left wrist finger flexors Sensory exam normal sensation to light touch and proprioception in bilateral upper and lower extremities  Musculoskeletal: Full range of motion in all 4 extremities. No joint swelling    Assessment/Plan: 1. Functional deficits VB infarct Stable for D/C today F/u PCP in 3-4 weeks F/u PM&R 2 mo See D/C  summary See D/C instructions  Care Tool:  Bathing    Body parts bathed by patient: Chest, Abdomen, Right upper leg, Left upper leg, Face, Left arm, Left lower leg, Right lower leg, Front perineal area, Buttocks   Body parts bathed by helper: Right arm Body parts n/a: Front perineal area, Buttocks   Bathing assist Assist Level: Minimal Assistance - Patient > 75%     Upper Body Dressing/Undressing Upper body dressing   What is the patient wearing?: Pull over shirt    Upper body assist Assist Level: Moderate Assistance - Patient 50 - 74%    Lower Body Dressing/Undressing Lower body dressing      What is the patient wearing?: Pants     Lower body assist Assist for lower body dressing: Moderate Assistance - Patient 50 - 74%     Toileting Toileting    Toileting assist Assist for toileting: Minimal Assistance - Patient > 75%     Transfers Chair/bed transfer  Transfers assist  Chair/bed transfer activity did not occur: Safety/medical concerns  Chair/bed transfer assist level: Minimal Assistance - Patient > 75% Chair/bed transfer assistive device:  Armrests, Museum/gallery exhibitions officer assist   Ambulation activity did not occur: Safety/medical concerns  Assist level: Minimal Assistance - Patient > 75% Assistive device: Walker-rolling Max distance: 130 ft   Walk 10 feet activity   Assist  Walk 10 feet activity did not occur: Safety/medical concerns  Assist level: Contact Guard/Touching assist Assistive device: Walker-rolling   Walk 50 feet activity   Assist Walk 50 feet with 2 turns activity did not occur: Safety/medical concerns  Assist level: Contact Guard/Touching assist Assistive device: Walker-rolling    Walk 150 feet activity   Assist Walk 150 feet activity did not occur: Safety/medical concerns  Assist level: Minimal Assistance - Patient > 75% Assistive device: Walker-rolling    Walk 10 feet on uneven surface   activity   Assist Walk 10 feet on uneven surfaces activity did not occur: Safety/medical concerns   Assist level: Minimal Assistance - Patient > 75% Assistive device: Chemical engineer     Assist Is the patient using a wheelchair?: Yes Type of Wheelchair: Manual    Wheelchair assist level: Dependent - Patient 0%      Wheelchair 50 feet with 2 turns activity    Assist        Assist Level: Dependent - Patient 0%   Wheelchair 150 feet activity     Assist      Assist Level: Dependent - Patient 0%   Blood pressure (!) 149/73, pulse 67, temperature 97.8 F (36.6 C), temperature source Oral, resp. rate 16, height 5\' 3"  (1.6 m), weight 63.4 kg, SpO2 94 %.  Medical Problem List and Plan: 1. Functional deficits secondary to posterior circulation stroke             -patient may shower             -d/c 1/4 S-         2.  Antithrombotics: -DVT/anticoagulation:  Pharmaceutical: Other (comment)--Eliquis             -antiplatelet therapy: ASA added  3. Pain Management: Nerve pain in L hand Tylenol prn.   12/30- will add gabapentin 300 mg QHS for nerve pain/also help sleep some- will monitor closely due to age 41. Mood: LCSW to follow for evaluation and support.              -antipsychotic agents: N/A 5. Neuropsych: This patient is not fully capable of making decisions on capable own behalf. 6. Skin/Wound Care: Routine pressure relief measures 7. Fluids/Electrolytes/Nutrition:  encourage PO  12/26- labs look great- except Glucose of 119- everything is normal range- I personally assessed 8. CAF/ICM s/p ICD: Monitor HR TID. Continue Eliquis and bisoprolol.             --monitor for symptoms with increase in activity.   12/24- HR controlled- con't regimen 9. H/o CVA with L-HP: Was independent without AD PTA. 10. HTN: Monitor BP TID--SBP goal 120-140 range per neur due to MCA/PCA stenosis             --continue bisoprolol and Avapro. Increase amlodipine to  10mg .   1/4 cont current meds f/u with PCP as OP  Vitals:   01/19/21 0501 01/19/21 0803  BP: 138/71 (!) 149/73  Pulse: 72 67  Resp: 16   Temp: 97.8 F (36.6 C)   SpO2: 94%     11. Dysphagia: Continue D1, nectar liquids.  .             -12/19 encouraged  her to push through diet until advanced  12/27- up to D2 nectar thick liquids. MBS on Friday  12/30- pending new MBS? Will check? 12. T2DM: Hgb A1C- 6.0. Continue to hold metformin             --monitor BS ac/hs and use SSI for now.  CBG (last 3)  Recent Labs    01/18/21 1608 01/18/21 2110 01/19/21 0632  GLUCAP 154* 156* 128*    01/17/21 controlled , D/C SSI on discharge, will restart metformin home dose  After that pt less compliant with diabetic diet at home , discussed with sons 80. Chronic intermittent insomnia: Will resume Melatonin (used 10 mg prn PTA) 14. Chronic congeseted cough: For 5-6 years and felt to be due to allergies. --added Claritin.    15.  Lethargy/confusion- resolved   labs ok, afebrile, no sedating meds   UCx no growth 16. Loose stools  Last BM 12/31 - now constipated  17. Insomnia  12/30- added gabapentin 300 mg QHS for pain/sleep and also changed trazodone prn to melatonin 3 mg QHS prn for sleep.   LOS: 28 days A FACE TO FACE EVALUATION WAS PERFORMED  Charlett Blake 01/19/2021, 9:35 AM

## 2021-01-20 ENCOUNTER — Telehealth: Payer: Self-pay

## 2021-01-20 NOTE — Telephone Encounter (Signed)
Noted! Thank you

## 2021-01-20 NOTE — Telephone Encounter (Signed)
Transition Care Management Follow-up Telephone Call Date of discharge and from where: TCM [facility] 01-19-21 Dx: Stroke How have you been since you were released from the hospital? Feeling well just tired  Any questions or concerns? No  Items Reviewed: Did the pt receive and understand the discharge instructions provided? Yes  Medications obtained and verified? Yes  Other? No  Any new allergies since your discharge? Yes  Dietary orders reviewed? Yes Do you have support at home? Yes   Home Care and Equipment/Supplies: Were home health services ordered? yes If so, what is the name of the agency? Name unknown  Has the agency set up a time to come to the patient's home? No- does not take her insurance - pt waiting to hear back of another [place]  Were any new equipment or medical supplies ordered?  Yes: walker/ wheelchair What is the name of the medical supply agency? Got at the hospital Were you able to get the supplies/equipment? yes Do you have any questions related to the use of the equipment or supplies? No  Functional Questionnaire: (I = Independent and D = Dependent) ADLs: D   Bathing/Dressing- D  Meal Prep- D  Eating- I  Maintaining continence- D  Transferring/Ambulation- D  Managing Meds- D  Follow up appointments reviewed:  PCP Hospital f/u appt confirmed? Yes  Scheduled to see Dr Alain Marion on 01-31-21 @ [facility] f/u appt confirmed? Yes  Scheduled to see Dr Letta Pate on 03-10-21 @ 115pm. Are transportation arrangements needed? No  If their condition worsens, is the pt aware to call PCP or go to the Emergency Dept.? Yes Was the patient provided with contact information for the PCP's office or ED? Yes Was to pt encouraged to call back with questions or concerns? Yes

## 2021-01-21 NOTE — Discharge Summary (Signed)
Physician Discharge Summary  Patient ID: Sloane Palmer MRN: [id number] DOB/AGE: Oct 13, 1935 86 y.o.  Admit date: 12/22/2020 Discharge date: 01/19/2021  Discharge Diagnoses:  Principal Problem:   Stroke (cerebrum) [facility]) Active Problems:   Chronic renal failure   Essential hypertension   Type 2 diabetes mellitus (HCC)   Pressure injury of skin   Discharged Condition: stable  Significant Diagnostic Studies: DG Swallowing Func-Speech Pathology  Result Date: 01/14/2021 Table formatting from the original result was not included. Objective Swallowing Evaluation: Type of Study: MBS-Modified Barium Swallow Study  Patient Details Name: Delynn Olvera MRN: [id number] Date of Birth: [birth year] Today's Date: 01/14/2021 Time: SLP Start Time (ACUTE ONLY): 6945 -SLP Stop Time (ACUTE ONLY): 0388 SLP Time Calculation (min) (ACUTE ONLY): 21 min Past Medical History: Past Medical History: Diagnosis Date  Allergic rhinitis due to pollen   Asthma   Atrial fibrillation ([place])   Biventricular ICD (implantable cardiac defibrillator) in place   St Jude 10/2011 (implant MD: Allred)  Bradycardia   a. coreg tapered off 05/2011 => b. Metoprolol restarted after cardiac arrest 09/2011  CAD (coronary artery disease)   a. s/p stent to RCA 1996;  b. s/p cutting balloon PTCA to LAD 2001; c. s/p Cypher DES to LAD 2003;  d. NSTEMI 4/12: DES to dRCA;  e.  [place] 9/13 (after presentation with cardiac arrest and + Nz's for NSTEMI): pLAD 30% ISR, oD1 30%, dLAD 60-70%, pOM1 (small vessel) 90%, oOM2 95% (moderate to large vessel), mCFX 30%, pOM3 30-40%, dRCA stents and pPDA stents patent with 30% ISR => med Rx rec.   Cancer [facility])   Cardiac arrest ([place]) 09/2011  a. VT/VF in community; resusc unsuccessful;  PEA in ED; multiple defibs => revived;  [employer];  c/b by VDRF, cardiogenic shock;  LHC with stable anatomy => med Rx;  difficult ween from vent => s/p trach;  d/c to SNF  Chronic eczema   hands  Chronic systolic heart failure (HCC)    Hemorrhoids   HLD (hyperlipidemia)   HTN (hypertension)   Ischemic cardiomyopathy   a. EF 40% at cath 4/12 with AL and Inf HK;  b.  Echo after presentation with cardiac arrest 9/13:  EF 40-45% and severe inf HK to AK c/w infarction, PASP 42  Left bundle branch block   Other and unspecified ovarian cyst   Other atopic dermatitis and related conditions   Personal history of hyperthyroidism   Personal history of malignant neoplasm of breast  Past Surgical History: Past Surgical History: Procedure Laterality Date  ABDOMINAL HYSTERECTOMY    BI-VENTRICULAR IMPLANTABLE CARDIOVERTER DEFIBRILLATOR N/A 11/02/2011  Procedure: BI-VENTRICULAR IMPLANTABLE CARDIOVERTER DEFIBRILLATOR  (CRT-D);  Surgeon: Thompson Grayer, MD;  Location: [facility] CATH LAB;  Service: Cardiovascular;  Laterality: N/A;  BIV ICD GENERATOR CHANGEOUT N/A 05/11/2017  Procedure: BIV ICD GENERATOR CHANGEOUT;  Surgeon: [name], MD;  Location: [place] CV LAB;  Service: Cardiovascular;  Laterality: N/A;  BREAST LUMPECTOMY  08/24/2003  right lumpectomy+sln,T2N0,ERPR+,Her2-  CHOLECYSTECTOMY    COLONOSCOPY    CORONARY STENT PLACEMENT  may and  july 2012  LEFT HEART CATHETERIZATION WITH CORONARY ANGIOGRAM N/A 10/09/2011  Procedure: LEFT HEART CATHETERIZATION WITH CORONARY ANGIOGRAM;  Surgeon: Larey Dresser, MD;  Location: [facility] CATH LAB;  Service: Cardiovascular;  Laterality: N/A;  OOPHORECTOMY    POLYPECTOMY    PTCA    VIDEO BRONCHOSCOPY  10/26/2011  Procedure: VIDEO BRONCHOSCOPY WITHOUT FLUORO;  Surgeon: Raylene Miyamoto, MD;  Location: [facility] ENDOSCOPY;  Service: Endoscopy;  Laterality:  Bilateral; HPI: see H&P  Subjective: "I'm thirsty"  Recommendations for follow up therapy are one component of a multi-disciplinary discharge planning process, led by the attending physician.  Recommendations may be updated based on patient status, additional functional criteria and insurance authorization. Assessment / Plan / Recommendation Clinical Impressions 01/14/2021 Clinical  Impression Patient presents with improvement in swallow funciton as compared to MBS completed on 12/2. During oral phase, she exhibited mild delay in mastication of dysphagia 3 solids, reduced anterior to posterior propulsion of all boluses, leading to premature spillage into vallecular sinus with liquids. During pharyngeal phase, patient exhibited swallow initiation delay to level of vallecular sinus with puree and dysphagia 3 solids, and delay at level of pyriform sinus with thin liquids. She exhibited two instances of trace penetration above cords with thin liquid barium that did not immedialely clear (PAS 3) but which did not result in aspiration. Dys 3 solid (cereal bar) resulted in mild-mod amount of vallecular sinus residuals. She then exhibited an instance of sensed trace to mild amount aspiration of thin liquid barium which appeared to be impacted by vallecular sinus residuals of solids. She did not exhibit any instances of aspiration of thin liquids in absence of solid food textures. SLP is recommending to upgrade patient's liquids from nectar thick to thin but to continue with Dys 1 (puree) solids. SLP Visit Diagnosis Dysphagia, oropharyngeal phase (R13.12) Attention and concentration deficit following -- Frontal lobe and executive function deficit following -- Impact on safety and function Mild aspiration risk;Moderate aspiration risk   Treatment Recommendations 12/17/2020 Treatment Recommendations Therapy as outlined in treatment plan below   Prognosis 12/17/2020 Prognosis for Safe Diet Advancement Good Barriers to Reach Goals Cognitive deficits Barriers/Prognosis Comment -- Diet Recommendations 01/14/2021 SLP Diet Recommendations Dysphagia 1 (Puree) solids;Thin liquid Liquid Administration via Cup;Straw Medication Administration Crushed with puree Compensations Slow rate;Small sips/bites;Minimize environmental distractions;Lingual sweep for clearance of pocketing;Multiple dry swallows after each  bite/sip;Effortful swallow Postural Changes Seated upright at 90 degrees   Other Recommendations 01/14/2021 Recommended Consults -- Oral Care Recommendations Oral care BID Other Recommendations -- Follow Up Recommendations -- Assistance recommended at discharge -- Functional Status Assessment -- Frequency and Duration  12/17/2020 Speech Therapy Frequency (ACUTE ONLY) min 2x/week Treatment Duration 1 week   Oral Phase 01/14/2021 Oral Phase -- Oral - Pudding Teaspoon -- Oral - Pudding Cup -- Oral - Honey Teaspoon -- Oral - Honey Cup -- Oral - Nectar Teaspoon -- Oral - Nectar Cup -- Oral - Nectar Straw -- Oral - Thin Teaspoon -- Oral - Thin Cup -- Oral - Thin Straw -- Oral - Puree -- Oral - Mech Soft -- Oral - Regular -- Oral - Multi-Consistency -- Oral - Pill NT Oral Phase - Comment --  Pharyngeal Phase 01/14/2021 Pharyngeal Phase Impaired Pharyngeal- Pudding Teaspoon -- Pharyngeal -- Pharyngeal- Pudding Cup -- Pharyngeal -- Pharyngeal- Honey Teaspoon -- Pharyngeal -- Pharyngeal- Honey Cup -- Pharyngeal -- Pharyngeal- Nectar Teaspoon NT Pharyngeal -- Pharyngeal- Nectar Cup -- Pharyngeal -- Pharyngeal- Nectar Straw -- Pharyngeal -- Pharyngeal- Thin Teaspoon -- Pharyngeal -- Pharyngeal- Thin Cup Delayed swallow initiation-pyriform sinuses;Reduced airway/laryngeal closure;Penetration/Aspiration during swallow Pharyngeal Material enters airway, passes BELOW cords and not ejected out despite cough attempt by patient;Material enters airway, remains ABOVE vocal cords and not ejected out Pharyngeal- Thin Straw Delayed swallow initiation-pyriform sinuses;Penetration/Aspiration during swallow Pharyngeal Material enters airway, remains ABOVE vocal cords and not ejected out Pharyngeal- Puree Delayed swallow initiation-vallecula;Pharyngeal residue - valleculae Pharyngeal Material does not enter airway Pharyngeal- Mechanical Soft  Delayed swallow initiation-vallecula;Pharyngeal residue - valleculae Pharyngeal Material does not enter  airway Pharyngeal- Regular NT Pharyngeal -- Pharyngeal- Multi-consistency NT Pharyngeal -- Pharyngeal- Pill NT Pharyngeal -- Pharyngeal Comment --  Cervical Esophageal Phase  01/14/2021 Cervical Esophageal Phase Impaired Pudding Teaspoon -- Pudding Cup -- Honey Teaspoon -- Honey Cup -- Nectar Teaspoon -- Nectar Cup -- Nectar Straw -- Thin Teaspoon -- Thin Cup Other (Comment) Thin Straw Other (Comment) Puree Other (Comment) Mechanical Soft Other (Comment) Regular -- Multi-consistency -- Pill -- Cervical Esophageal Comment -- Sonia Baller, MA, CCC-SLP Speech Therapy                      Labs:  Basic Metabolic Panel: BMP Latest Ref Rng & Units 01/17/2021 01/10/2021 01/03/2021  Glucose 70 - 99 mg/dL 119(H) 119(H) 113(H)  BUN 8 - 23 mg/dL _0 Creatinine 0.44 - 1.00 mg/dL 0.87 0.80 0.70  BUN/Creat Ratio 12 - 28 - - -  Sodium 135 - 145 mmol/L 138 137 138  Potassium 3.5 - 5.1 mmol/L 4.1 4.2 3.6  Chloride 98 - 111 mmol/L 103 104 103  CO2 22 - 32 mmol/L _1 Calcium 8.9 - 10.3 mg/dL 9.5 9.2 9.2      CBC: CBC Latest Ref Rng & Units 01/17/2021 01/10/2021 01/03/2021  WBC 4.0 - 10.5 K/uL 7.9 10.0 9.3  Hemoglobin 12.0 - 15.0 g/dL 13.2 13.0 13.3  Hematocrit 36.0 - 46.0 % 41.1 40.8 40.9  Platelets 150 - 400 K/uL 283 328 327      CBG: Recent Labs  Lab 01/19/21 0632  GLUCAP 128*    Brief HPI:   Ima Hafner is a 86 y.o. LH-female with history of ICM with chronic systolic CHF s/p ICD, A. fib, prior CVA with mild left hemiplegia who was admitted to [facility] on 12/17/2020 with increasing left-sided weakness and slurred speech.  CT head was negative and neurology felt the patient reports to circulation stroke likely cardioembolic versus due to small vessel disease.  MRI not done due to ICD.  CTA head showed no LVO and severe stenosis left-MCA M1 segment and short segment distal right PCA P2 segment.  2D echo showed LVEF 40% and no new changes compatible with prior echo as well as  aortic sclerosis.  Low-dose aspirin was added by Dr. Kendrick Ranch.  MBS done revealing oropharyngeal dysphagia with aspiration of thin liquids and as patient was placed on modified diet with thickened liquids.  She continued to be limited by left-sided weakness with dysarthria, loss of balance to the right as well as attempts at standing with knees blocked.  CIR was recommended due to functional decline.   Hospital Course: Tyara Dassow was admitted to rehab 12/22/2020 for inpatient therapies to consist of PT, ST and OT at least three hours five days a week. Past admission physiatrist, therapy team and rehab RN have worked together to provide customized collaborative inpatient rehab.  She was maintained on Eliquis throughout her stay and has been tolerating this without side effects.  Serial check of CBC shows H&H and platelets to be stable.  She was maintained on dysphagia 1 diet with nectar liquids and has required encouragement for intake serial check of BMP showed electrolytes and renal status to be stable. Gabapentin was added to help manage nerve pain and to manage insomnia. MBS done showing improvement in swallow function and she was advanced to dysphagia 2 diet with thin liquids.   She has  had issues with chronic congested cough due to allergies and Claritin was added to help manage symptoms.  Her blood pressures were monitored on TID basis and amlodipine was added for better control.  Her diabetes has been monitored with ac/hs CBG checks and SSI was use prn for tighter BS control.  M still etformin has been held during her stay and sliding scale insulin was used for blood sugar management.  This was resumed at discharge discharge at 250 mg twice daily and family advised to monitor blood sugars closely after discharge.  She has steady and requires.  Continue receive PT, OT, ST, RN, SNA MSW by [facility] home health after discharge.    Rehab course: During patient's stay in rehab weekly team conferences  were held to monitor patient's progress, set goals and discuss barriers to discharge. At admission, patient required total assist with ADL tasks in place to total assist for mobility. She exhibited significant cognitive linguistic deficits and was oriented to person only.  She also had significant dysarthria with reduced vocal intensity and dysphagia requiring modified diet. She  has had improvement in activity tolerance, balance, postural control as well as ability to compensate for deficits. She has had improvement in functional use LUE  and LLE as well as improvement in awareness. She he is able to  bathe with min assist and requires mod assist for dressing tasks. She requires mod assist with toileting tasks..  She requires min assist with cues and use of rolling walker she is tolerating dysphagia 1 diet and thin liquids without any signs of signs and symptoms of aspiration.  With basic cognitive tasks and mod to max assist for selective engine and cueing for high-level cognitive skills.  Family education has been completed regarding all aspects of safety and care   Disposition: Home  Diet: Dysphagia 2, thin liquids  Special Instructions: Monitor blood sugars before meals and at bedtime.  Follow-up with PCP titration of metformin. Needs supervision with meals.   Discharge Instructions     Ambulatory referral to Neurology   Complete by: As directed    An appointment is requested in approximately: 2-3 weeks   Ambulatory referral to Physical Medicine Rehab   Complete by: As directed    Hospital follow up      Allergies as of 01/19/2021       Reactions   Coreg [carvedilol] Other (See Comments)   Severe wheezing   [name] [trandolapril] Cough   Meperidine Hcl Swelling, Other (See Comments)   Mixed with phenergan, swelling of the mouth    Penicillins Other (See Comments)   Pt has taken penicillin about 6 years ago with no side effects   Molds & Smuts Other (See Comments)   Runny nose   Tape  Other (See Comments)   Redness, pulls skin off   Tetanus Toxoids Rash        Medication List     STOP taking these medications    Cyanocobalamin 1000 MCG Subl Replaced by: cyanocobalamin 1000 MCG tablet   triamcinolone ointment 0.5 % Commonly known as: KENALOG       TAKE these medications    amLODipine 10 MG tablet Commonly known as: NORVASC Take 1 tablet (10 mg total) by mouth daily.   apixaban 5 MG Tabs tablet Commonly known as: Eliquis Take 1 tablet (5 mg total) by mouth 2 (two) times daily. What changed: how much to take   aspirin 81 MG chewable tablet Chew 1 tablet (81 mg total) by  mouth daily.   atorvastatin 80 MG tablet Commonly known as: LIPITOR Take 1 tablet (80 mg total) by mouth daily. Take 1 tablet by mouth once daily What changed: medication strength   bisoprolol 5 MG tablet Commonly known as: ZEBETA Take 1 tablet (5 mg total) by mouth 2 (two) times daily.   cetirizine 10 MG tablet Commonly known as: ZYRTEC Take 1 tablet (10 mg total) by mouth as needed for allergies. What changed: when to take this   cyanocobalamin 1000 MCG tablet Take 1 tablet (1,000 mcg total) by mouth daily. Replaces: Cyanocobalamin 1000 MCG Subl   irbesartan 75 MG tablet Commonly known as: AVAPRO Take 1 tablet (75 mg total) by mouth daily.   metFORMIN 500 MG tablet Commonly known as: GLUCOPHAGE Take 1 tablet (500 mg total) by mouth daily with breakfast. Notes to patient: We have been holding this in the hospital. Monitor blood sugars more frequently and make sure intake is good. Can decrease to 1/2 tablet if blood sugars are low.    nitroGLYCERIN 0.4 MG SL tablet Commonly known as: NITROSTAT Place 1 tablet (0.4 mg total) under the tongue every 5 (five) minutes x 3 doses as needed for chest pain. Chest pain   polyethylene glycol 17 g packet Commonly known as: MIRALAX / GLYCOLAX Take 17 g by mouth daily as needed for mild constipation.   PRESERVISION AREDS PO Take  1 capsule by mouth 2 (two) times daily.   traZODone 50 MG tablet Commonly known as: DESYREL Take 1 tablet (50 mg total) by mouth at bedtime.   Vitamin D3 50 MCG (2000 UT) capsule Take 1 capsule (2,000 Units total) by mouth daily.        Follow-up Information     Kirsteins, Luanna Salk, MD Follow up.   Specialty: Physical Medicine and Rehabilitation Why: office will call you with follow up appointment Contact information: [place] [address] [phone]         [name], [name], MD. Call.   Specialty: Internal Medicine Why: for post hospital follow up Contact information: [place] [address] [phone]         [facility] Follow up.   Why: office will call you with follow up appointment Contact information: [address]     [place] [address] [phone]                Signed: Bary Leriche 01/26/2021, 3:23 AM

## 2021-01-25 ENCOUNTER — Telehealth: Payer: Self-pay | Admitting: Internal Medicine

## 2021-01-25 NOTE — Telephone Encounter (Signed)
Alicia Guerrero has called from [facility]. She is with pt right now, and is requesting verbals for PT 2x week- 4 weeks, 1x week- 4 weeks. Requesting nursing and speech therapy, as well.     Callback #- [phone]

## 2021-01-25 NOTE — Telephone Encounter (Signed)
Ok Thx 

## 2021-01-26 DIAGNOSIS — R1310 Dysphagia, unspecified: Secondary | ICD-10-CM

## 2021-01-26 NOTE — Telephone Encounter (Signed)
Verbal orders given to Stacey. 

## 2021-01-28 ENCOUNTER — Telehealth: Payer: Self-pay | Admitting: Internal Medicine

## 2021-01-28 NOTE — Telephone Encounter (Signed)
Okay to take benadryl, would also recommend covid-19 home test. Call back if positive.

## 2021-01-28 NOTE — Telephone Encounter (Signed)
Notified [name] w/ MD response.Marland KitchenJohny Chess

## 2021-01-28 NOTE — Telephone Encounter (Signed)
Patient son Karma Greaser calling in  Says patient is having a lot of seasonal allergies & coughing up thick clear mucus & experiencing a lot of drainage.. says she is currently taking Claritin & Zyrtec  Wants to know if its okay to give patient Benadryl  Please call [phone]

## 2021-01-28 NOTE — Telephone Encounter (Signed)
MD is out of the office pls advise on msg below../lmb 

## 2021-01-31 ENCOUNTER — Ambulatory Visit (INDEPENDENT_AMBULATORY_CARE_PROVIDER_SITE_OTHER): Payer: Medicare HMO | Admitting: Internal Medicine

## 2021-01-31 ENCOUNTER — Encounter: Payer: Self-pay | Admitting: Internal Medicine

## 2021-01-31 ENCOUNTER — Other Ambulatory Visit: Payer: Self-pay

## 2021-01-31 ENCOUNTER — Ambulatory Visit (INDEPENDENT_AMBULATORY_CARE_PROVIDER_SITE_OTHER): Payer: Medicare HMO

## 2021-01-31 DIAGNOSIS — E1121 Type 2 diabetes mellitus with diabetic nephropathy: Secondary | ICD-10-CM

## 2021-01-31 DIAGNOSIS — I10 Essential (primary) hypertension: Secondary | ICD-10-CM

## 2021-01-31 DIAGNOSIS — I6930 Unspecified sequelae of cerebral infarction: Secondary | ICD-10-CM | POA: Insufficient documentation

## 2021-01-31 DIAGNOSIS — R2689 Other abnormalities of gait and mobility: Secondary | ICD-10-CM | POA: Diagnosis not present

## 2021-01-31 DIAGNOSIS — G8929 Other chronic pain: Secondary | ICD-10-CM | POA: Diagnosis not present

## 2021-01-31 DIAGNOSIS — J3089 Other allergic rhinitis: Secondary | ICD-10-CM

## 2021-01-31 DIAGNOSIS — N3000 Acute cystitis without hematuria: Secondary | ICD-10-CM | POA: Diagnosis not present

## 2021-01-31 DIAGNOSIS — I482 Chronic atrial fibrillation, unspecified: Secondary | ICD-10-CM

## 2021-01-31 DIAGNOSIS — I69354 Hemiplegia and hemiparesis following cerebral infarction affecting left non-dominant side: Secondary | ICD-10-CM

## 2021-01-31 DIAGNOSIS — M7989 Other specified soft tissue disorders: Secondary | ICD-10-CM | POA: Diagnosis not present

## 2021-01-31 DIAGNOSIS — M25532 Pain in left wrist: Secondary | ICD-10-CM

## 2021-01-31 DIAGNOSIS — N390 Urinary tract infection, site not specified: Secondary | ICD-10-CM | POA: Insufficient documentation

## 2021-01-31 DIAGNOSIS — J309 Allergic rhinitis, unspecified: Secondary | ICD-10-CM | POA: Insufficient documentation

## 2021-01-31 LAB — CBC WITH DIFFERENTIAL/PLATELET
Basophils Absolute: 0.1 10*3/uL (ref 0.0–0.1)
Basophils Relative: 1 % (ref 0.0–3.0)
Eosinophils Absolute: 0.6 10*3/uL (ref 0.0–0.7)
Eosinophils Relative: 5.4 % — ABNORMAL HIGH (ref 0.0–5.0)
HCT: 42.2 % (ref 36.0–46.0)
Hemoglobin: 13.9 g/dL (ref 12.0–15.0)
Lymphocytes Relative: 15.7 % (ref 12.0–46.0)
Lymphs Abs: 1.6 10*3/uL (ref 0.7–4.0)
MCHC: 33.1 g/dL (ref 30.0–36.0)
MCV: 81.8 fl (ref 78.0–100.0)
Monocytes Absolute: 0.9 10*3/uL (ref 0.1–1.0)
Monocytes Relative: 9.1 % (ref 3.0–12.0)
Neutro Abs: 7.1 10*3/uL (ref 1.4–7.7)
Neutrophils Relative %: 68.8 % (ref 43.0–77.0)
Platelets: 316 10*3/uL (ref 150.0–400.0)
RBC: 5.16 Mil/uL — ABNORMAL HIGH (ref 3.87–5.11)
RDW: 14.5 % (ref 11.5–15.5)
WBC: 10.3 10*3/uL (ref 4.0–10.5)

## 2021-01-31 LAB — COMPREHENSIVE METABOLIC PANEL
ALT: 33 U/L (ref 0–35)
AST: 29 U/L (ref 0–37)
Albumin: 4.2 g/dL (ref 3.5–5.2)
Alkaline Phosphatase: 91 U/L (ref 39–117)
BUN: 15 mg/dL (ref 6–23)
CO2: 24 mEq/L (ref 19–32)
Calcium: 9.9 mg/dL (ref 8.4–10.5)
Chloride: 102 mEq/L (ref 96–112)
Creatinine, Ser: 0.68 mg/dL (ref 0.40–1.20)
GFR: 80.52 mL/min (ref >60.00)
Glucose, Bld: 151 mg/dL — ABNORMAL HIGH (ref 70–99)
Potassium: 3.7 mEq/L (ref 3.5–5.1)
Sodium: 135 mEq/L (ref 135–145)
Total Bilirubin: 0.7 mg/dL (ref 0.2–1.2)
Total Protein: 7.4 g/dL (ref 6.0–8.3)

## 2021-01-31 LAB — URINALYSIS, ROUTINE W REFLEX MICROSCOPIC
Bilirubin Urine: NEGATIVE
Hgb urine dipstick: NEGATIVE
Ketones, ur: NEGATIVE
Nitrite: NEGATIVE
RBC / HPF: NONE SEEN (ref 0–?)
Specific Gravity, Urine: <=1.005 — AB (ref 1.000–1.030)
Total Protein, Urine: NEGATIVE
Urine Glucose: NEGATIVE
Urobilinogen, UA: 0.2 (ref 0.0–1.0)
pH: 6 (ref 5.0–8.0)

## 2021-01-31 LAB — HEMOGLOBIN A1C: Hgb A1c MFr Bld: 6.9 % — ABNORMAL HIGH (ref 4.6–6.5)

## 2021-01-31 LAB — URIC ACID: Uric Acid, Serum: 5.5 mg/dL (ref 2.4–7.0)

## 2021-01-31 LAB — MAGNESIUM: Magnesium: 1.5 mg/dL (ref 1.5–2.5)

## 2021-01-31 MED ORDER — CIPROFLOXACIN HCL 250 MG PO TABS: 250.0000 mg | ORAL_TABLET | Freq: Two times a day (BID) | ORAL | 0 refills | Status: AC

## 2021-01-31 MED ORDER — FLUTICASONE PROPIONATE 50 MCG/ACT NA SUSP: 2.0000 | Freq: Every day | NASAL | 6 refills | Status: AC

## 2021-01-31 MED ORDER — OXYMETAZOLINE HCL 0.05 % NA SOLN: 1.0000 | Freq: Two times a day (BID) | NASAL | 0 refills | Status: AC

## 2021-01-31 NOTE — Assessment & Plan Note (Signed)
R/o fx X ray

## 2021-01-31 NOTE — Assessment & Plan Note (Addendum)
Worse 1/23 Start Flonase nasal spray  Zyrtec in am Benadryl 50 mg at night (hold Trazodone) Afrin as needed

## 2021-01-31 NOTE — Assessment & Plan Note (Signed)
Cont on Arbisartan, Bisoprolol, Amlodipine

## 2021-01-31 NOTE — Addendum Note (Signed)
Addended by: [name] on: 01/31/2021 11:34 PM   Modules accepted: Orders

## 2021-01-31 NOTE — Assessment & Plan Note (Signed)
Cont on Metformin Check labs

## 2021-01-31 NOTE — Assessment & Plan Note (Signed)
Mild.  Take over-the-counter magnesium 1 a day

## 2021-01-31 NOTE — Progress Notes (Signed)
Subjective:  Patient ID: Alicia Guerrero, female    DOB: 1935-07-03  Age: 86 y.o. MRN: [id number]  CC: Follow-up [facility] F/u)   HPI Alicia Guerrero presents for CVA, HTN, memory loss f/u C/o L wrist pain - ?fell prior to admission. C/o allergies - worse. Benadryl helps...  Per hx: "Admit date: 12/22/2020 Discharge date: 01/19/2021   Discharge Diagnoses:  Principal Problem:   Stroke (cerebrum) [facility]) Active Problems:   Chronic renal failure   Essential hypertension   Type 2 diabetes mellitus (HCC)   Pressure injury of skin     Discharged Condition: stable   Significant Diagnostic Studies:  Imaging Results  DG Swallowing Func-Speech Pathology   Result Date: 01/14/2021 Table formatting from the original result was not included. Objective Swallowing Evaluation: Type of Study: MBS-Modified Barium Swallow Study  Patient Details Name: Alicia Guerrero MRN: [id number] Date of Birth: 12/11/1935 Today's Date: 01/14/2021 Time: SLP Start Time (ACUTE ONLY): 2248 -SLP Stop Time (ACUTE ONLY): 2500 SLP Time Calculation (min) (ACUTE ONLY): 21 min Past Medical History: Past Medical History: Diagnosis Date  Allergic rhinitis due to pollen   Asthma   Atrial fibrillation ([place])   Biventricular ICD (implantable cardiac defibrillator) in place   St Jude 10/2011 (implant MD: Allred)  Bradycardia   a. coreg tapered off 05/2011 => b. Metoprolol restarted after cardiac arrest 09/2011  CAD (coronary artery disease)   a. s/p stent to RCA 1996;  b. s/p cutting balloon PTCA to LAD 2001; c. s/p Cypher DES to LAD 2003;  d. NSTEMI 4/12: DES to dRCA;  e.  [place] 9/13 (after presentation with cardiac arrest and + Nz's for NSTEMI): pLAD 30% ISR, oD1 30%, dLAD 60-70%, pOM1 (small vessel) 90%, oOM2 95% (moderate to large vessel), mCFX 30%, pOM3 30-40%, dRCA stents and pPDA stents patent with 30% ISR => med Rx rec.   Cancer [facility])   Cardiac arrest ([place]) 09/2011  a. VT/VF in community; resusc unsuccessful;  PEA in ED; multiple defibs  => revived;  [employer];  c/b by VDRF, cardiogenic shock;  LHC with stable anatomy => med Rx;  difficult ween from vent => s/p trach;  d/c to SNF  Chronic eczema   hands  Chronic systolic heart failure (HCC)   Hemorrhoids   HLD (hyperlipidemia)   HTN (hypertension)   Ischemic cardiomyopathy   a. EF 40% at cath 4/12 with AL and Inf HK;  b.  Echo after presentation with cardiac arrest 9/13:  EF 40-45% and severe inf HK to AK c/w infarction, PASP 42  Left bundle branch block   Other and unspecified ovarian cyst   Other atopic dermatitis and related conditions   Personal history of hyperthyroidism   Personal history of malignant neoplasm of breast  Past Surgical History: Past Surgical History: Procedure Laterality Date  ABDOMINAL HYSTERECTOMY    BI-VENTRICULAR IMPLANTABLE CARDIOVERTER DEFIBRILLATOR N/A 11/02/2011  Procedure: BI-VENTRICULAR IMPLANTABLE CARDIOVERTER DEFIBRILLATOR  (CRT-D);  Surgeon: Thompson Grayer, MD;  Location: [facility] CATH LAB;  Service: Cardiovascular;  Laterality: N/A;  BIV ICD GENERATOR CHANGEOUT N/A 05/11/2017  Procedure: BIV ICD GENERATOR CHANGEOUT;  Surgeon: [name], MD;  Location: [place] CV LAB;  Service: Cardiovascular;  Laterality: N/A;  BREAST LUMPECTOMY  08/24/2003  right lumpectomy+sln,T2N0,ERPR+,Her2-  CHOLECYSTECTOMY    COLONOSCOPY    CORONARY STENT PLACEMENT  may and  july 2012  LEFT HEART CATHETERIZATION WITH CORONARY ANGIOGRAM N/A 10/09/2011  Procedure: LEFT HEART CATHETERIZATION WITH CORONARY ANGIOGRAM;  Surgeon: Larey Dresser,  MD;  Location: [place] CATH LAB;  Service: Cardiovascular;  Laterality: N/A;  OOPHORECTOMY    POLYPECTOMY    PTCA    VIDEO BRONCHOSCOPY  10/26/2011  Procedure: VIDEO BRONCHOSCOPY WITHOUT FLUORO;  Surgeon: Raylene Miyamoto, MD;  Location: [facility] ENDOSCOPY;  Service: Endoscopy;  Laterality: Bilateral; HPI: see H&P  Subjective: "I'm thirsty"  Recommendations for follow up therapy are one component of a multi-disciplinary discharge planning process, led by  the attending physician.  Recommendations may be updated based on patient status, additional functional criteria and insurance authorization. Assessment / Plan / Recommendation Clinical Impressions 01/14/2021 Clinical Impression Patient presents with improvement in swallow funciton as compared to MBS completed on 12/2. During oral phase, she exhibited mild delay in mastication of dysphagia 3 solids, reduced anterior to posterior propulsion of all boluses, leading to premature spillage into vallecular sinus with liquids. During pharyngeal phase, patient exhibited swallow initiation delay to level of vallecular sinus with puree and dysphagia 3 solids, and delay at level of pyriform sinus with thin liquids. She exhibited two instances of trace penetration above cords with thin liquid barium that did not immedialely clear (PAS 3) but which did not result in aspiration. Dys 3 solid (cereal bar) resulted in mild-mod amount of vallecular sinus residuals. She then exhibited an instance of sensed trace to mild amount aspiration of thin liquid barium which appeared to be impacted by vallecular sinus residuals of solids. She did not exhibit any instances of aspiration of thin liquids in absence of solid food textures. SLP is recommending to upgrade patient's liquids from nectar thick to thin but to continue with Dys 1 (puree) solids. SLP Visit Diagnosis Dysphagia, oropharyngeal phase (R13.12) Attention and concentration deficit following -- Frontal lobe and executive function deficit following -- Impact on safety and function Mild aspiration risk;Moderate aspiration risk   Treatment Recommendations 12/17/2020 Treatment Recommendations Therapy as outlined in treatment plan below   Prognosis 12/17/2020 Prognosis for Safe Diet Advancement Good Barriers to Reach Goals Cognitive deficits Barriers/Prognosis Comment -- Diet Recommendations 01/14/2021 SLP Diet Recommendations Dysphagia 1 (Puree) solids;Thin liquid Liquid Administration  via Cup;Straw Medication Administration Crushed with puree Compensations Slow rate;Small sips/bites;Minimize environmental distractions;Lingual sweep for clearance of pocketing;Multiple dry swallows after each bite/sip;Effortful swallow Postural Changes Seated upright at 90 degrees   Other Recommendations 01/14/2021 Recommended Consults -- Oral Care Recommendations Oral care BID Other Recommendations -- Follow Up Recommendations -- Assistance recommended at discharge -- Functional Status Assessment -- Frequency and Duration  12/17/2020 Speech Therapy Frequency (ACUTE ONLY) min 2x/week Treatment Duration 1 week   Oral Phase 01/14/2021 Oral Phase -- Oral - Pudding Teaspoon -- Oral - Pudding Cup -- Oral - Honey Teaspoon -- Oral - Honey Cup -- Oral - Nectar Teaspoon -- Oral - Nectar Cup -- Oral - Nectar Straw -- Oral - Thin Teaspoon -- Oral - Thin Cup -- Oral - Thin Straw -- Oral - Puree -- Oral - Mech Soft -- Oral - Regular -- Oral - Multi-Consistency -- Oral - Pill NT Oral Phase - Comment --  Pharyngeal Phase 01/14/2021 Pharyngeal Phase Impaired Pharyngeal- Pudding Teaspoon -- Pharyngeal -- Pharyngeal- Pudding Cup -- Pharyngeal -- Pharyngeal- Honey Teaspoon -- Pharyngeal -- Pharyngeal- Honey Cup -- Pharyngeal -- Pharyngeal- Nectar Teaspoon NT Pharyngeal -- Pharyngeal- Nectar Cup -- Pharyngeal -- Pharyngeal- Nectar Straw -- Pharyngeal -- Pharyngeal- Thin Teaspoon -- Pharyngeal -- Pharyngeal- Thin Cup Delayed swallow initiation-pyriform sinuses;Reduced airway/laryngeal closure;Penetration/Aspiration during swallow Pharyngeal Material enters airway, passes BELOW cords and not ejected out despite cough attempt  by patient;Material enters airway, remains ABOVE vocal cords and not ejected out Pharyngeal- Thin Straw Delayed swallow initiation-pyriform sinuses;Penetration/Aspiration during swallow Pharyngeal Material enters airway, remains ABOVE vocal cords and not ejected out Pharyngeal- Puree Delayed swallow  initiation-vallecula;Pharyngeal residue - valleculae Pharyngeal Material does not enter airway Pharyngeal- Mechanical Soft Delayed swallow initiation-vallecula;Pharyngeal residue - valleculae Pharyngeal Material does not enter airway Pharyngeal- Regular NT Pharyngeal -- Pharyngeal- Multi-consistency NT Pharyngeal -- Pharyngeal- Pill NT Pharyngeal -- Pharyngeal Comment --  Cervical Esophageal Phase  01/14/2021 Cervical Esophageal Phase Impaired Pudding Teaspoon -- Pudding Cup -- Honey Teaspoon -- Honey Cup -- Nectar Teaspoon -- Nectar Cup -- Nectar Straw -- Thin Teaspoon -- Thin Cup Other (Comment) Thin Straw Other (Comment) Puree Other (Comment) Mechanical Soft Other (Comment) Regular -- Multi-consistency -- Pill -- Cervical Esophageal Comment -- Alicia Baller, Alicia Guerrero, Alicia Guerrero Speech Therapy                         Labs:  Basic Metabolic Panel: BMP Latest Ref Rng & Units 01/17/2021 01/10/2021 01/03/2021  Glucose 70 - 99 mg/dL 119(H) 119(H) 113(H)  BUN 8 - 23 mg/dL _0 Creatinine 0.44 - 1.00 mg/dL 0.87 0.80 0.70  BUN/Creat Ratio 12 - 28 - - -  Sodium 135 - 145 mmol/L 138 137 138  Potassium 3.5 - 5.1 mmol/L 4.1 4.2 3.6  Chloride 98 - 111 mmol/L 103 104 103  CO2 22 - 32 mmol/L _1 Calcium 8.9 - 10.3 mg/dL 9.5 9.2 9.2        CBC: CBC Latest Ref Rng & Units 01/17/2021 01/10/2021 01/03/2021  WBC 4.0 - 10.5 K/uL 7.9 10.0 9.3  Hemoglobin 12.0 - 15.0 g/dL 13.2 13.0 13.3  Hematocrit 36.0 - 46.0 % 41.1 40.8 40.9  Platelets 150 - 400 K/uL 283 328 327        CBG: Last Labs      Recent Labs  Lab 01/19/21 0632  GLUCAP 128*        Brief HPI:   Fryda Molenda is a 86 y.o. LH-female with history of ICM with chronic systolic CHF s/p ICD, A. fib, prior CVA with mild left hemiplegia who was admitted to [facility] on 12/17/2020 with increasing left-sided weakness and slurred speech.  CT head was negative and neurology felt the patient reports to circulation stroke likely cardioembolic  versus due to small vessel disease.  MRI not done due to ICD.  CTA head showed no LVO and severe stenosis left-MCA M1 segment and short segment distal right PCA P2 segment.  2D echo showed LVEF 40% and no new changes compatible with prior echo as well as aortic sclerosis.  Low-dose aspirin was added by Dr. Kendrick Ranch.  MBS done revealing oropharyngeal dysphagia with aspiration of thin liquids and as patient was placed on modified diet with thickened liquids.  She continued to be limited by left-sided weakness with dysarthria, loss of balance to the right as well as attempts at standing with knees blocked.  CIR was recommended due to functional decline.     Hospital Course: Nohelia Valenza was admitted to rehab 12/22/2020 for inpatient therapies to consist of PT, ST and OT at least three hours five days a week. Past admission physiatrist, therapy team and rehab RN have worked together to provide customized collaborative inpatient rehab.  She was maintained on Eliquis throughout her stay and has been tolerating this without side effects.  Serial check of CBC  shows H&H and platelets to be stable.  She was maintained on dysphagia 1 diet with nectar liquids and has required encouragement for intake serial check of BMP showed electrolytes and renal status to be stable. Gabapentin was added to help manage nerve pain and to manage insomnia. MBS done showing improvement in swallow function and she was advanced to dysphagia 2 diet with thin liquids.    She has had issues with chronic congested cough due to allergies and Claritin was added to help manage symptoms.  Her blood pressures were monitored on TID basis and amlodipine was added for better control.  Her diabetes has been monitored with ac/hs CBG checks and SSI was use prn for tighter BS control.  M still etformin has been held during her stay and sliding scale insulin was used for blood sugar management.  This was resumed at discharge discharge at 250 mg twice daily  and family advised to monitor blood sugars closely after discharge.  She has steady and requires.  Continue receive PT, OT, ST, RN, SNA MSW by [facility] health after discharge.      Rehab course: During patient's stay in rehab weekly team conferences were held to monitor patient's progress, set goals and discuss barriers to discharge. At admission, patient required total assist with ADL tasks in place to total assist for mobility. She exhibited significant cognitive linguistic deficits and was oriented to person only.  She also had significant dysarthria with reduced vocal intensity and dysphagia requiring modified diet. She  has had improvement in activity tolerance, balance, postural control as well as ability to compensate for deficits. She has had improvement in functional use LUE  and LLE as well as improvement in awareness. She he is able to  bathe with min assist and requires mod assist for dressing tasks. She requires mod assist with toileting tasks..  She requires min assist with cues and use of rolling walker she is tolerating dysphagia 1 diet and thin liquids without any signs of signs and symptoms of aspiration.  With basic cognitive tasks and mod to max assist for selective engine and cueing for high-level cognitive skills.  Family education has been completed regarding all aspects of safety and care    Disposition: Home   Diet: Dysphagia 2, thin liquids   Special Instructions: Monitor blood sugars before meals and at bedtime.  Follow-up with PCP titration of metformin. Needs supervision with meals.    Discharge Instructions       Ambulatory referral to Neurology   Complete by: As directed      An appointment is requested in approximately: 2-3 weeks    Ambulatory referral to Physical Medicine Rehab   Complete by: As directed      Hospital follow up  "   Outpatient Medications Prior to Visit  Medication Sig Dispense Refill   amLODipine (NORVASC) 10 MG tablet Take 1 tablet  (10 mg total) by mouth daily. 30 tablet 0   apixaban (ELIQUIS) 5 MG TABS tablet Take 1 tablet (5 mg total) by mouth 2 (two) times daily. 60 tablet 0   aspirin 81 MG chewable tablet Chew 1 tablet (81 mg total) by mouth daily. 30 tablet 3   atorvastatin (LIPITOR) 80 MG tablet Take 1 tablet (80 mg total) by mouth daily. Take 1 tablet by mouth once daily 30 tablet 0   bisoprolol (ZEBETA) 5 MG tablet Take 1 tablet (5 mg total) by mouth 2 (two) times daily. 180 tablet 2   cetirizine (ZYRTEC)  10 MG tablet Take 1 tablet (10 mg total) by mouth as needed for allergies. (Patient taking differently: Take 10 mg by mouth daily.) 90 tablet 3   Cholecalciferol (VITAMIN D3) 50 MCG (2000 UT) capsule Take 1 capsule (2,000 Units total) by mouth daily. 100 capsule 3   irbesartan (AVAPRO) 75 MG tablet Take 1 tablet (75 mg total) by mouth daily. 30 tablet 0   metFORMIN (GLUCOPHAGE) 500 MG tablet Take 1 tablet (500 mg total) by mouth daily with breakfast. 90 tablet 3   Multiple Vitamins-Minerals (PRESERVISION AREDS PO) Take 1 capsule by mouth 2 (two) times daily.     nitroGLYCERIN (NITROSTAT) 0.4 MG SL tablet Place 1 tablet (0.4 mg total) under the tongue every 5 (five) minutes x 3 doses as needed for chest pain. Chest pain 20 tablet 3   polyethylene glycol (MIRALAX / GLYCOLAX) 17 g packet Take 17 g by mouth daily as needed for mild constipation. 14 each 0   traZODone (DESYREL) 50 MG tablet Take 1 tablet (50 mg total) by mouth at bedtime. 30 tablet 0   vitamin B-12 1000 MCG tablet Take 1 tablet (1,000 mcg total) by mouth daily. 30 tablet 0   No facility-administered medications prior to visit.    ROS: Review of Systems  Constitutional:  Positive for unexpected weight change. Negative for activity change, appetite change, chills and fatigue.  HENT:  Positive for postnasal drip, rhinorrhea and sinus pressure. Negative for congestion and mouth sores.   Eyes:  Negative for visual disturbance.  Respiratory:  Negative for  cough and chest tightness.   Gastrointestinal:  Negative for abdominal pain and nausea.  Genitourinary:  Negative for difficulty urinating, frequency and vaginal pain.  Musculoskeletal:  Negative for back pain and gait problem.  Skin:  Negative for pallor and rash.  Neurological:  Negative for dizziness, tremors, weakness, numbness and headaches.  Psychiatric/Behavioral:  Negative for confusion and sleep disturbance.    Objective:  BP 128/62 (BP Location: Left Arm)    Pulse 66    Temp 98.3 F (36.8 C) (Oral)    Ht $R'5\' 3"'kV$  (1.6 m)    Wt 142 lb (64.4 kg) Comment: per son was check in hosp. pt can't stand   SpO2 95%    BMI 25.15 kg/m   BP Readings from Last 3 Encounters:  01/31/21 128/62  01/19/21 (!) 149/73  12/22/20 (!) 149/66    Wt Readings from Last 3 Encounters:  01/31/21 142 lb (64.4 kg)  01/12/21 139 lb 12.4 oz (63.4 kg)  12/17/20 138 lb 7.2 oz (62.8 kg)    Physical Exam Constitutional:      General: She is not in acute distress.    Appearance: Normal appearance. She is well-developed. She is not ill-appearing.  HENT:     Head: Normocephalic.     Right Ear: External ear normal.     Left Ear: External ear normal.     Nose: Nose normal.  Eyes:     General:        Right eye: No discharge.        Left eye: No discharge.     Conjunctiva/sclera: Conjunctivae normal.     Pupils: Pupils are equal, round, and reactive to light.  Neck:     Thyroid: No thyromegaly.     Vascular: No JVD.     Trachea: No tracheal deviation.  Cardiovascular:     Rate and Rhythm: Normal rate. Rhythm irregular.     Heart sounds: Normal heart sounds.  Pulmonary:     Effort: No respiratory distress.     Breath sounds: No stridor. No wheezing.  Abdominal:     General: Bowel sounds are normal. There is no distension.     Palpations: Abdomen is soft. There is no mass.     Tenderness: There is no abdominal tenderness. There is no guarding or rebound.  Musculoskeletal:        General: Swelling and  tenderness present.     Cervical back: Normal range of motion and neck supple. No rigidity.  Lymphadenopathy:     Cervical: No cervical adenopathy.  Skin:    Findings: No erythema or rash.  Neurological:     Cranial Nerves: No cranial nerve deficit.     Motor: Weakness present. No abnormal muscle tone.     Coordination: Coordination normal.     Gait: Gait abnormal.     Deep Tendon Reflexes: Reflexes normal.  Psychiatric:        Behavior: Behavior normal.        Thought Content: Thought content normal.        Judgment: Judgment normal.  Use a walker Weal L wrist w/pain/swelling  Lab Results  Component Value Date   WBC 7.9 01/17/2021   HGB 13.2 01/17/2021   HCT 41.1 01/17/2021   PLT 283 01/17/2021   GLUCOSE 119 (H) 01/17/2021   CHOL 136 12/17/2020   TRIG 76 12/17/2020   HDL 42 12/17/2020   LDLDIRECT 91.1 03/21/2010   LDLCALC 79 12/17/2020   ALT 18 12/23/2020   AST 19 12/23/2020   NA 138 01/17/2021   K 4.1 01/17/2021   CL 103 01/17/2021   CREATININE 0.87 01/17/2021   BUN 23 01/17/2021   CO2 24 01/17/2021   TSH 2.054 06/25/2018   INR 1.6 (H) 12/16/2020   HGBA1C 6.0 (H) 12/17/2020    DG CHEST PORT 1 VIEW  Result Date: 12/23/2020 CLINICAL DATA:  Cough, concern for aspiration EXAM: PORTABLE CHEST 1 VIEW COMPARISON:  Chest radiograph 07/02/2017 FINDINGS: A left chest wall cardiac device with 3 associated leads is stable. The heart is mildly enlarged, unchanged. There is no focal consolidation or pulmonary edema. There is no pleural effusion or pneumothorax. There is dextroscoliosis of the thoracic spine. There is no acute osseous abnormality. IMPRESSION: Mild cardiomegaly. No radiographic evidence of acute cardiopulmonary process. Electronically Signed   By: Valetta Mole M.D.   On: 12/23/2020 11:04    Assessment & Plan:   Problem List Items Addressed This Visit     Allergic rhinitis    Worse 1/23 Start Flonase nasal spray  Zyrtec in am Benadryl 50 mg at night (hold  Trazodone) Afrin as needed      Atrial fibrillation (HCC)    On Eliquis Cont on Arbisartan, Bisoprolol, Amlodipine      Relevant Medications   fluticasone (FLONASE) 50 MCG/ACT nasal spray   oxymetazoline (AFRIN NASAL SPRAY) 0.05 % nasal spray   Other Relevant Orders   CBC with Differential/Platelet   Hemoglobin A1c   Comprehensive metabolic panel   Magnesium   Uric acid   Urinalysis   Balance problem    In a w/c Use a walker In PT      Essential hypertension    Cont on Arbisartan, Bisoprolol, Amlodipine      Relevant Medications   fluticasone (FLONASE) 50 MCG/ACT nasal spray   oxymetazoline (AFRIN NASAL SPRAY) 0.05 % nasal spray   Other Relevant Orders   CBC with Differential/Platelet   Hemoglobin A1c  Comprehensive metabolic panel   Magnesium   Uric acid   Urinalysis   History of cerebrovascular accident (CVA) with residual deficit    x2. H/o prior CVA with mild left hemiplegia who was admitted to [facility] on 12/17/2020 with increasing left-sided weakness and slurred speech.  CT head was negative and neurology felt the patient reports to circulation stroke likely cardioembolic versus due to small vessel disease.  MRI not done due to ICD.  CTA head showed no LVO and severe stenosis left-MCA M1 segment and short segment distal right PCA P2 segment.  2D echo showed LVEF 40% and no new changes compatible with prior echo as well as aortic sclerosis.  Low-dose aspirin was added by Dr. Kendrick Ranch.  MBS done revealing oropharyngeal dysphagia with aspiration of thin liquids and as patient was placed on modified diet with thickened liquids.  She continued to be limited by left-sided weakness with dysarthria, loss of balance to the right as well as attempts at standing with knees blocked.  CIR was recommended due to functional decline.  Lipitor, Eliquis Back to nl diet      Type 2 diabetes mellitus (HCC)    Cont on Metformin Check labs      Relevant Medications    fluticasone (FLONASE) 50 MCG/ACT nasal spray   oxymetazoline (AFRIN NASAL SPRAY) 0.05 % nasal spray   Other Relevant Orders   CBC with Differential/Platelet   Hemoglobin A1c   Comprehensive metabolic panel   Magnesium   Uric acid   Urinalysis   Wrist pain, chronic, left    R/o fx X ray      Relevant Orders   DG Wrist Complete Left   Uric acid      Meds ordered this encounter  Medications   fluticasone (FLONASE) 50 MCG/ACT nasal spray    Sig: Place 2 sprays into both nostrils daily.    Dispense:  16 g    Refill:  6   oxymetazoline (AFRIN NASAL SPRAY) 0.05 % nasal spray    Sig: Place 1 spray into both nostrils 2 (two) times daily.    Dispense:  30 mL    Refill:  0      Follow-up: Return in about 2 months (around 03/31/2021) for a follow-up visit.  Walker Kehr, MD

## 2021-01-31 NOTE — Assessment & Plan Note (Signed)
On Eliquis Cont on Arbisartan, Bisoprolol, Amlodipine

## 2021-01-31 NOTE — Assessment & Plan Note (Signed)
x2. H/o prior CVA with mild left hemiplegia who was admitted to [facility] on 12/17/2020 with increasing left-sided weakness and slurred speech.  CT head was negative and neurology felt the patient reports to circulation stroke likely cardioembolic versus due to small vessel disease.  MRI not done due to ICD.  CTA head showed no LVO and severe stenosis left-MCA M1 segment and short segment distal right PCA P2 segment.  2D echo showed LVEF 40% and no new changes compatible with prior echo as well as aortic sclerosis.  Low-dose aspirin was added by Dr. Kendrick Ranch.  MBS done revealing oropharyngeal dysphagia with aspiration of thin liquids and as patient was placed on modified diet with thickened liquids.  She continued to be limited by left-sided weakness with dysarthria, loss of balance to the right as well as attempts at standing with knees blocked.  CIR was recommended due to functional decline.  Lipitor, Eliquis Back to nl diet

## 2021-01-31 NOTE — Patient Instructions (Signed)
Start Flonase nasal spray  Zyrtec in am Benadryl 50 mg at night (hold Trazodone) Afrin as needed

## 2021-01-31 NOTE — Assessment & Plan Note (Addendum)
In a w/c Use a walker In PT

## 2021-01-31 NOTE — Assessment & Plan Note (Signed)
New.  Take Cipro 250 mg twice a day for 3 days

## 2021-02-01 ENCOUNTER — Telehealth: Payer: Self-pay | Admitting: Internal Medicine

## 2021-02-01 NOTE — Telephone Encounter (Signed)
Notified Jim via voicemail no fracture.

## 2021-02-01 NOTE — Telephone Encounter (Signed)
Alicia Guerrero w/ [facility] patient's ot and is  inquiring about the results of patient's 01-31-2021 left wrist imaging   Rep is requesting a call back   Phone [phone]

## 2021-02-02 NOTE — Telephone Encounter (Signed)
Left wrist x-ray reveals no fracture.  She has severe osteoarthritis. She needs to use Voltaren gel 4 times a day and use Ace wrap or a wrist brace especially for PT/OT sessions. Thank you

## 2021-02-04 ENCOUNTER — Telehealth: Payer: Self-pay

## 2021-02-04 NOTE — Telephone Encounter (Signed)
Alicia Guerrero with [facility] is calling for verbal order for speech therapy... 1xw 6 to address dysphagia.  [phone].

## 2021-02-05 NOTE — Telephone Encounter (Signed)
Okay.  Thanks.

## 2021-02-07 NOTE — Telephone Encounter (Signed)
Tried calling Alicia Guerrero there was no answer and can't leave msg due to vm being full.Marland KitchenJohny Guerrero

## 2021-02-07 NOTE — Telephone Encounter (Signed)
LVM for pt of PCP instructions.

## 2021-02-08 DIAGNOSIS — E785 Hyperlipidemia, unspecified: Secondary | ICD-10-CM | POA: Diagnosis not present

## 2021-02-08 DIAGNOSIS — I110 Hypertensive heart disease with heart failure: Secondary | ICD-10-CM | POA: Diagnosis not present

## 2021-02-08 DIAGNOSIS — R1310 Dysphagia, unspecified: Secondary | ICD-10-CM

## 2021-02-08 DIAGNOSIS — J4520 Mild intermittent asthma, uncomplicated: Secondary | ICD-10-CM | POA: Diagnosis not present

## 2021-02-08 DIAGNOSIS — Z95810 Presence of automatic (implantable) cardiac defibrillator: Secondary | ICD-10-CM

## 2021-02-08 DIAGNOSIS — Z7901 Long term (current) use of anticoagulants: Secondary | ICD-10-CM

## 2021-02-08 DIAGNOSIS — I2510 Atherosclerotic heart disease of native coronary artery without angina pectoris: Secondary | ICD-10-CM | POA: Diagnosis not present

## 2021-02-08 DIAGNOSIS — Z7984 Long term (current) use of oral hypoglycemic drugs: Secondary | ICD-10-CM

## 2021-02-08 DIAGNOSIS — E119 Type 2 diabetes mellitus without complications: Secondary | ICD-10-CM | POA: Diagnosis not present

## 2021-02-08 DIAGNOSIS — I69354 Hemiplegia and hemiparesis following cerebral infarction affecting left non-dominant side: Secondary | ICD-10-CM | POA: Diagnosis not present

## 2021-02-08 DIAGNOSIS — I4891 Unspecified atrial fibrillation: Secondary | ICD-10-CM | POA: Diagnosis not present

## 2021-02-08 DIAGNOSIS — E039 Hypothyroidism, unspecified: Secondary | ICD-10-CM

## 2021-02-08 DIAGNOSIS — I255 Ischemic cardiomyopathy: Secondary | ICD-10-CM | POA: Diagnosis not present

## 2021-02-08 DIAGNOSIS — Z7982 Long term (current) use of aspirin: Secondary | ICD-10-CM

## 2021-02-08 DIAGNOSIS — I5022 Chronic systolic (congestive) heart failure: Secondary | ICD-10-CM | POA: Diagnosis not present

## 2021-02-09 ENCOUNTER — Other Ambulatory Visit: Payer: Self-pay | Admitting: Physical Medicine and Rehabilitation

## 2021-02-09 NOTE — Telephone Encounter (Signed)
Notified Anna Genre w/ MD response.Marland KitchenJohny Chess

## 2021-02-17 ENCOUNTER — Other Ambulatory Visit: Payer: Self-pay | Admitting: Physical Medicine and Rehabilitation

## 2021-02-18 ENCOUNTER — Other Ambulatory Visit: Payer: Self-pay | Admitting: Physical Medicine and Rehabilitation

## 2021-02-23 ENCOUNTER — Encounter: Payer: Self-pay | Admitting: Internal Medicine

## 2021-02-25 ENCOUNTER — Telehealth: Payer: Self-pay | Admitting: Internal Medicine

## 2021-02-25 MED ORDER — IRBESARTAN 75 MG PO TABS: 75.0000 mg | ORAL_TABLET | Freq: Every day | ORAL | 0 refills | Status: DC

## 2021-02-25 MED ORDER — AMLODIPINE BESYLATE 10 MG PO TABS: 10.0000 mg | ORAL_TABLET | Freq: Every day | ORAL | 0 refills | Status: DC

## 2021-02-25 NOTE — Telephone Encounter (Signed)
Alicia Guerrero with [facility] calls today to inform Dr.[name] of a missed visit with PT. PT's caretaker (Son) stated she had not been up for physical therapy for the day

## 2021-03-01 NOTE — Telephone Encounter (Signed)
Noted. Thanks.

## 2021-03-02 ENCOUNTER — Other Ambulatory Visit: Payer: Self-pay | Admitting: Internal Medicine

## 2021-03-02 ENCOUNTER — Telehealth: Payer: Self-pay | Admitting: Internal Medicine

## 2021-03-02 MED ORDER — IRBESARTAN 75 MG PO TABS: 75.0000 mg | ORAL_TABLET | Freq: Every day | ORAL | 3 refills | Status: AC

## 2021-03-02 MED ORDER — AMLODIPINE BESYLATE 10 MG PO TABS: 10.0000 mg | ORAL_TABLET | Freq: Every day | ORAL | 3 refills | Status: AC

## 2021-03-02 NOTE — Telephone Encounter (Signed)
[place] [phone]  Needs verbal order for  Medical Social Worker - Care giver needs assistance with community services or any help that they can get.  Please Advise.

## 2021-03-03 NOTE — Telephone Encounter (Signed)
Okay.  Thanks.

## 2021-03-07 ENCOUNTER — Other Ambulatory Visit: Payer: Self-pay | Admitting: Physical Medicine and Rehabilitation

## 2021-03-10 ENCOUNTER — Encounter: Payer: Medicare HMO | Admitting: Physical Medicine & Rehabilitation

## 2021-03-14 ENCOUNTER — Ambulatory Visit (INDEPENDENT_AMBULATORY_CARE_PROVIDER_SITE_OTHER): Payer: Medicare HMO

## 2021-03-14 DIAGNOSIS — I255 Ischemic cardiomyopathy: Secondary | ICD-10-CM

## 2021-03-14 LAB — CUP PACEART REMOTE DEVICE CHECK
Battery Remaining Longevity: 13 mo
Battery Remaining Percentage: 23 %
Battery Voltage: 2.8 V
Brady Statistic AP VP Percent: 95 %
Brady Statistic AP VS Percent: 1 %
Brady Statistic AS VP Percent: 2.8 %
Brady Statistic AS VS Percent: 1 %
Brady Statistic RA Percent Paced: 95 %
Date Time Interrogation Session: 20230227020015
HighPow Impedance: 72 Ohm
HighPow Impedance: 72 Ohm
Implantable Lead Implant Date: 20131017
Implantable Lead Implant Date: 20131017
Implantable Lead Implant Date: 20131017
Implantable Lead Location: 753858
Implantable Lead Location: 753859
Implantable Lead Location: 753860
Implantable Pulse Generator Implant Date: 20190426
Lead Channel Impedance Value: 380 Ohm
Lead Channel Impedance Value: 380 Ohm
Lead Channel Impedance Value: 760 Ohm
Lead Channel Pacing Threshold Amplitude: 0.75 V
Lead Channel Pacing Threshold Amplitude: 1 V
Lead Channel Pacing Threshold Amplitude: 2.25 V
Lead Channel Pacing Threshold Pulse Width: 0.5 ms
Lead Channel Pacing Threshold Pulse Width: 0.5 ms
Lead Channel Pacing Threshold Pulse Width: 1.5 ms
Lead Channel Sensing Intrinsic Amplitude: 12 mV
Lead Channel Sensing Intrinsic Amplitude: 2.7 mV
Lead Channel Setting Pacing Amplitude: 2 V
Lead Channel Setting Pacing Amplitude: 2 V
Lead Channel Setting Pacing Amplitude: 2.75 V
Lead Channel Setting Pacing Pulse Width: 0.5 ms
Lead Channel Setting Pacing Pulse Width: 1.5 ms
Lead Channel Setting Sensing Sensitivity: 0.5 mV
Pulse Gen Serial Number: 9820905

## 2021-03-16 NOTE — Progress Notes (Signed)
Remote ICD transmission.   

## 2021-03-26 ENCOUNTER — Other Ambulatory Visit: Payer: Self-pay | Admitting: Internal Medicine

## 2021-03-26 DIAGNOSIS — F01B0 Vascular dementia, moderate, without behavioral disturbance, psychotic disturbance, mood disturbance, and anxiety: Secondary | ICD-10-CM

## 2021-03-26 DIAGNOSIS — I6930 Unspecified sequelae of cerebral infarction: Secondary | ICD-10-CM

## 2021-03-30 ENCOUNTER — Telehealth: Payer: Self-pay | Admitting: Internal Medicine

## 2021-03-30 NOTE — Telephone Encounter (Signed)
Okay.  Thanks.

## 2021-03-30 NOTE — Telephone Encounter (Signed)
Pt referral to Home health will have to have a need of Nursing and or PT added to referral. Please advise and Thank you! ?

## 2021-04-01 NOTE — Telephone Encounter (Signed)
Good morning! Was you able to add the Nursing and or PT to the referral. Please advise and thank you! ?

## 2021-04-01 NOTE — Telephone Encounter (Signed)
Can you do the verbal order?  Thanks ?

## 2021-04-04 ENCOUNTER — Other Ambulatory Visit: Payer: Self-pay | Admitting: Physical Medicine & Rehabilitation

## 2021-04-07 ENCOUNTER — Telehealth: Payer: Self-pay | Admitting: Internal Medicine

## 2021-04-07 NOTE — Telephone Encounter (Signed)
Successful telephone encounter to patient's son to follow up on someone calling patient about an incomplete remote transmission on 04/02/21. Unsure of who contacted patient about remote transmission as this was not done by device clinic. In [email or web address] there was communication between remote monitor and patients device however alerts were not checked. Attempted to assist son with sending remote transmission however transmission could not be completed. Son is provided [facility] contact for troubleshooting and will call device clinic once resolved.   ?

## 2021-04-07 NOTE — Telephone Encounter (Signed)
Patient's son called in stating he was just speaking with a Anderson Malta, Therapist, sports, but the call was disconnected.  ?Call was regarding a transmission received between ??3/18-3/20.  ?Norina Buzzard, RN with device, but she did not speak with the patient.  ?Double checked chart, but no documentation regarding this matter.  ?No answer from device clinic. ? ?Patient's son states whoever he spoke with wanted to be sure patient's monitor is plugged in.  ?Patient's son confirmed monitor is plugged in-- phone is also connected and connection is secure.  ?Patient's son would like a call back from device clinic to discuss further when able. ?

## 2021-04-08 ENCOUNTER — Other Ambulatory Visit: Payer: Self-pay | Admitting: Physical Medicine & Rehabilitation

## 2021-04-11 ENCOUNTER — Other Ambulatory Visit: Payer: Self-pay

## 2021-04-11 ENCOUNTER — Telehealth: Payer: Self-pay | Admitting: Internal Medicine

## 2021-04-11 MED ORDER — ATORVASTATIN CALCIUM 80 MG PO TABS: 80.0000 mg | ORAL_TABLET | Freq: Every day | ORAL | 0 refills | Status: DC

## 2021-04-11 MED ORDER — APIXABAN 5 MG PO TABS: 5.0000 mg | ORAL_TABLET | Freq: Two times a day (BID) | ORAL | 0 refills | Status: DC

## 2021-04-11 NOTE — Telephone Encounter (Signed)
It was done ?Thx ?

## 2021-04-11 NOTE — Telephone Encounter (Signed)
1.Medication Requested: atorvastatin (LIPITOR) 80 MG tablet ?apixaban (ELIQUIS) 5 MG TABS tablet ? ? ?2. Pharmacy (Name, Street, [place]): [facility], [address]  ?Phone:  [phone] ?Fax:  [phone] ? ? ?3. On Med List: yes ? ?4. Last Visit with PCP: 01.16.23 ? ?5. Next visit date with PCP: n/a ? ?**Patient is currently out of med & needs refill asap** ? ? ?Agent: Please be advised that RX refills may take up to 3 business days. We ask that you follow-up with your pharmacy.  ?

## 2021-05-02 ENCOUNTER — Telehealth: Payer: Self-pay | Admitting: Internal Medicine

## 2021-05-02 NOTE — Telephone Encounter (Signed)
PT is using [facility] for home care/PT (not using [facility]) per son [name] ?

## 2021-05-02 NOTE — Telephone Encounter (Signed)
[facility] states pt needs appt this week in order to receive hh due to pt not being seen within 90 days ? ?Advised him provider's next availability is 06-04-2021 ? ?Caller inquiring if pt can be called and scheduled a sooner appt to follow up w/ some pre existing conditions  ? ?Caller requesting a cb w/ response ? ?Phone [phone]- Darrick Meigs  ?

## 2021-05-10 ENCOUNTER — Telehealth: Payer: Self-pay | Admitting: *Deleted

## 2021-05-10 DIAGNOSIS — I6930 Unspecified sequelae of cerebral infarction: Secondary | ICD-10-CM

## 2021-05-10 DIAGNOSIS — R2689 Other abnormalities of gait and mobility: Secondary | ICD-10-CM

## 2021-05-10 DIAGNOSIS — E1121 Type 2 diabetes mellitus with diabetic nephropathy: Secondary | ICD-10-CM

## 2021-05-10 NOTE — Telephone Encounter (Signed)
Alicia Guerrero, RMA ?Can you do another home health referral and add pt. Put ref in for home health.Marland KitchenJohny Chess ?

## 2021-05-10 NOTE — Telephone Encounter (Signed)
Place order for physical therapy.. can not add to home heath referral../lmb ?

## 2021-05-10 NOTE — Telephone Encounter (Signed)
Alicia Guerrero, RMA ?I am guess . Some kinda way this got placed in [facility] workgue. So they was working on it and decided to send it to me   ?  ?   ?Previous Messages ?  ?----- Message -----  ?From: Alicia Guerrero, RMA  ?Sent: 05/10/2021  10:26 AM EDT  ?To: Alicia Guerrero  ? ?Do you need referral for skilled nursing or physical therapy  ?----- Message -----  ?From: Alicia Guerrero  ?Sent: 05/10/2021  10:16 AM EDT  ?To: Alicia Guerrero, RMA  ? ?Can you help with this . I sent this to Dr Camila Li awhile ago and he never responded back.  ?----- Message -----  ?From: Alicia Guerrero  ?Sent: 04/27/2021  10:43 AM EDT  ?To: Alicia Guerrero  ? ? ?----- Message -----  ?From: Alicia Guerrero, Alicia Guerrero  ?Sent: 04/25/2021   8:36 AM EDT  ?To: Alicia Guerrero  ? ?Okay, thank you so much. Maybe you can try reaching out to the doctor's CMA?  ? ?----- Message -----  ?From: Alicia Guerrero  ?Sent: 04/25/2021   8:17 AM EDT  ?To: Alicia Guerrero Alicia Guerrero  ? ?No I didn't.  I sent the message directly to the doctor but no reply. Let me try another route  ?----- Message -----  ?From: Alicia Guerrero, Alicia Guerrero  ?Sent: 04/22/2021   7:39 AM EDT  ?To: Alicia Guerrero  ? ?Good morning, I am following up on this? Do you have any updates.  ? ?----- Message -----  ?From: Alicia Guerrero  ?Sent: 04/21/2021   9:37 AM EDT  ?To: Alicia Guerrero Alicia Guerrero  ? ?Don't Marveen Reeks me... But what's SN or PT.. lol   (PT I'm assuming is Physical Therapy) but the SN you got me on that one  ?----- Message -----  ?From: Alicia Guerrero, Alicia Guerrero  ?Sent: 04/21/2021   8:17 AM EDT  ?To: Alicia Guerrero  ? ?I see SW and HHA as the disciplines. I will need SN or PT documented in the Home Health order to fully proceed.  ? ?----- Message -----  ?From: Alicia Guerrero  ?Sent: 04/20/2021   5:26 PM EDT  ?To: Alicia Guerrero Alicia Guerrero  ? ?1 of 6  ? ? ? ? ? ? ?  ?

## 2021-05-10 NOTE — Addendum Note (Signed)
Addended by: Earnstine Regal on: 05/10/2021 12:02 PM ? ? Modules accepted: Orders ? ?

## 2021-05-11 ENCOUNTER — Other Ambulatory Visit: Payer: Self-pay | Admitting: Internal Medicine

## 2021-06-06 ENCOUNTER — Other Ambulatory Visit: Payer: Self-pay | Admitting: Internal Medicine

## 2021-06-10 ENCOUNTER — Other Ambulatory Visit: Payer: Self-pay | Admitting: Internal Medicine

## 2021-06-14 ENCOUNTER — Ambulatory Visit (INDEPENDENT_AMBULATORY_CARE_PROVIDER_SITE_OTHER): Payer: Medicare HMO

## 2021-06-14 DIAGNOSIS — I255 Ischemic cardiomyopathy: Secondary | ICD-10-CM | POA: Diagnosis not present

## 2021-06-16 LAB — CUP PACEART REMOTE DEVICE CHECK
Battery Remaining Longevity: 16 mo
Battery Remaining Percentage: 28 %
Battery Voltage: 2.84 V
Brady Statistic AP VP Percent: 95 %
Brady Statistic AP VS Percent: 1 %
Brady Statistic AS VP Percent: 2.7 %
Brady Statistic AS VS Percent: 1 %
Brady Statistic RA Percent Paced: 95 %
Date Time Interrogation Session: 20230530211804
HighPow Impedance: 75 Ohm
HighPow Impedance: 75 Ohm
Implantable Lead Implant Date: 20131017
Implantable Lead Implant Date: 20131017
Implantable Lead Implant Date: 20131017
Implantable Lead Location: 753858
Implantable Lead Location: 753859
Implantable Lead Location: 753860
Implantable Pulse Generator Implant Date: 20190426
Lead Channel Impedance Value: 340 Ohm
Lead Channel Impedance Value: 390 Ohm
Lead Channel Impedance Value: 760 Ohm
Lead Channel Pacing Threshold Amplitude: 0.875 V
Lead Channel Pacing Threshold Amplitude: 0.875 V
Lead Channel Pacing Threshold Amplitude: 2.25 V
Lead Channel Pacing Threshold Pulse Width: 0.5 ms
Lead Channel Pacing Threshold Pulse Width: 0.5 ms
Lead Channel Pacing Threshold Pulse Width: 1.5 ms
Lead Channel Sensing Intrinsic Amplitude: 12 mV
Lead Channel Sensing Intrinsic Amplitude: 2.2 mV
Lead Channel Setting Pacing Amplitude: 1.875
Lead Channel Setting Pacing Amplitude: 2 V
Lead Channel Setting Pacing Amplitude: 2.75 V
Lead Channel Setting Pacing Pulse Width: 0.5 ms
Lead Channel Setting Pacing Pulse Width: 1.5 ms
Lead Channel Setting Sensing Sensitivity: 0.5 mV
Pulse Gen Serial Number: 9820905

## 2021-06-17 ENCOUNTER — Other Ambulatory Visit: Payer: Self-pay | Admitting: Internal Medicine

## 2021-06-20 ENCOUNTER — Other Ambulatory Visit: Payer: Self-pay | Admitting: Internal Medicine

## 2021-06-27 ENCOUNTER — Telehealth: Payer: Medicare HMO | Admitting: Internal Medicine

## 2021-06-29 NOTE — Progress Notes (Signed)
Remote ICD transmission.   

## 2021-07-20 ENCOUNTER — Telehealth: Payer: Self-pay

## 2021-07-20 NOTE — Telephone Encounter (Signed)
I called [facility] for the second time to verify if the patient needs her ICD therapies turned off. Tommie Sams took a message to give to the nurse.

## 2021-07-20 NOTE — Telephone Encounter (Signed)
I spoke with [facility] hospice nurse Anderson Malta and she states hospice do want the patient ICD therapies turned off. I told her I will ask Dr. Caryl Comes for the order to turn off the ICD therapies. Once I get his order than I will reach out to [facility] to have them turn the patient ICD therapies off. Anderson Malta verbalized understanding.

## 2021-07-20 NOTE — Telephone Encounter (Signed)
I got verbal orders from Dr. Caryl Comes to have patient ICD therapies disable. I e-mailed Gaspar Bidding, [employer] rep to let him know about disabling the patient ICD therapies. I canceled upcoming remotes and marked her inactive in [place].

## 2021-07-20 NOTE — Telephone Encounter (Signed)
The patient son called stating that the patient is now in hospice and needs her ICD therapies turned off. I let him know usually the hospice nurse is the one who gives [place] a call.   I called [facility] [phone]) to verify the patient was in fact in hospice and needs her ICD therapies turned off. No answe. I left a voicemail for someone to give me a call back.  I will let Dr. Caryl Comes know as soon as [facility] give [place] a call back and let me know if they want the patient ICD therapies should be turned off.

## 2021-07-21 ENCOUNTER — Telehealth: Payer: Self-pay

## 2021-07-21 NOTE — Telephone Encounter (Signed)
I spoke with Tommie Sams the on call hospice nurse and Dr. Caryl Comes spoke with her and gave her a verbal order for the ICD to be deactivated.

## 2021-09-15 ENCOUNTER — Encounter: Payer: Self-pay | Admitting: Internal Medicine

## 2021-09-16 DEATH — deceased

## 2021-10-19 ENCOUNTER — Telehealth: Payer: Self-pay

## 2021-10-19 NOTE — Telephone Encounter (Signed)
Error

## 2021-10-19 NOTE — Telephone Encounter (Signed)
Can we update chart that patient has passed?

## 2021-10-19 NOTE — Telephone Encounter (Signed)
Updated chart decease pt.Marland KitchenJohny Guerrero
